# Patient Record
Sex: Male | Born: 1935
Health system: Southern US, Community
[De-identification: ages and names within clinical notes are randomized; demographics above are authoritative.]

## PROBLEM LIST (undated history)

## (undated) DIAGNOSIS — Z8719 Personal history of other diseases of the digestive system: Secondary | ICD-10-CM

## (undated) DIAGNOSIS — K224 Dyskinesia of esophagus: Secondary | ICD-10-CM

## (undated) DIAGNOSIS — I712 Thoracic aortic aneurysm, without rupture, unspecified: Secondary | ICD-10-CM

## (undated) DIAGNOSIS — M797 Fibromyalgia: Secondary | ICD-10-CM

## (undated) DIAGNOSIS — Z8774 Personal history of (corrected) congenital malformations of heart and circulatory system: Secondary | ICD-10-CM

## (undated) DIAGNOSIS — I442 Atrioventricular block, complete: Secondary | ICD-10-CM

## (undated) DIAGNOSIS — Z85828 Personal history of other malignant neoplasm of skin: Secondary | ICD-10-CM

## (undated) DIAGNOSIS — M1990 Unspecified osteoarthritis, unspecified site: Secondary | ICD-10-CM

## (undated) DIAGNOSIS — Z860101 Personal history of adenomatous and serrated colon polyps: Secondary | ICD-10-CM

## (undated) DIAGNOSIS — I10 Essential (primary) hypertension: Secondary | ICD-10-CM

## (undated) DIAGNOSIS — Z950 Presence of cardiac pacemaker: Secondary | ICD-10-CM

## (undated) DIAGNOSIS — K922 Gastrointestinal hemorrhage, unspecified: Secondary | ICD-10-CM

## (undated) DIAGNOSIS — M353 Polymyalgia rheumatica: Secondary | ICD-10-CM

## (undated) DIAGNOSIS — I2510 Atherosclerotic heart disease of native coronary artery without angina pectoris: Secondary | ICD-10-CM

## (undated) DIAGNOSIS — R6510 Systemic inflammatory response syndrome (SIRS) of non-infectious origin without acute organ dysfunction: Secondary | ICD-10-CM

## (undated) DIAGNOSIS — Z86010 Personal history of colonic polyps: Secondary | ICD-10-CM

## (undated) DIAGNOSIS — I4891 Unspecified atrial fibrillation: Secondary | ICD-10-CM

## (undated) DIAGNOSIS — R072 Precordial pain: Secondary | ICD-10-CM

## (undated) DIAGNOSIS — K921 Melena: Secondary | ICD-10-CM

## (undated) DIAGNOSIS — Z952 Presence of prosthetic heart valve: Secondary | ICD-10-CM

## (undated) DIAGNOSIS — K649 Unspecified hemorrhoids: Secondary | ICD-10-CM

## (undated) DIAGNOSIS — I350 Nonrheumatic aortic (valve) stenosis: Secondary | ICD-10-CM

## (undated) DIAGNOSIS — D500 Iron deficiency anemia secondary to blood loss (chronic): Secondary | ICD-10-CM

## (undated) HISTORY — DX: Personal history of adenomatous and serrated colon polyps: Z86.0101

## (undated) HISTORY — DX: Nonrheumatic aortic (valve) stenosis: I35.0

## (undated) HISTORY — DX: Thoracic aortic aneurysm, without rupture, unspecified: I71.20

## (undated) HISTORY — PX: HERNIA REPAIR: SHX51

## (undated) HISTORY — DX: Personal history of (corrected) congenital malformations of heart and circulatory system: Z87.74

## (undated) HISTORY — DX: Gastrointestinal hemorrhage, unspecified: K92.2

## (undated) HISTORY — DX: Melena: K92.1

## (undated) HISTORY — DX: Essential (primary) hypertension: I10

## (undated) HISTORY — DX: Dyskinesia of esophagus: K22.4

## (undated) HISTORY — DX: Unspecified atrial fibrillation: I48.91

## (undated) HISTORY — DX: Personal history of other malignant neoplasm of skin: Z85.828

## (undated) HISTORY — DX: Precordial pain: R07.2

## (undated) HISTORY — DX: Atherosclerotic heart disease of native coronary artery without angina pectoris: I25.10

## (undated) HISTORY — DX: Personal history of other diseases of the digestive system: Z87.19

## (undated) HISTORY — DX: Unspecified hemorrhoids: K64.9

## (undated) HISTORY — DX: Presence of prosthetic heart valve: Z95.2

## (undated) HISTORY — DX: Polymyalgia rheumatica: M35.3

## (undated) HISTORY — PX: HEMORROIDECTOMY: SUR656

## (undated) HISTORY — DX: Iron deficiency anemia secondary to blood loss (chronic): D50.0

## (undated) HISTORY — DX: Personal history of colonic polyps: Z86.010

## (undated) HISTORY — PX: BACK SURGERY: SHX140

## (undated) HISTORY — DX: Atrioventricular block, complete: I44.2

## (undated) HISTORY — DX: Systemic inflammatory response syndrome (sirs) of non-infectious origin without acute organ dysfunction: R65.10

## (undated) HISTORY — DX: Unspecified osteoarthritis, unspecified site: M19.90

## (undated) HISTORY — DX: Thoracic aortic aneurysm, without rupture: I71.2

## (undated) HISTORY — PX: OTHER SURGICAL HISTORY: SHX169

## (undated) HISTORY — PX: APPENDECTOMY: SHX54

## (undated) HISTORY — PX: PACEMAKER PLACEMENT: SHX43

## (undated) HISTORY — DX: Presence of cardiac pacemaker: Z95.0

## (undated) HISTORY — PX: BILATERAL KNEE ARTHROSCOPY: SUR91

---

## 1999-12-04 ENCOUNTER — Encounter: Payer: Self-pay | Admitting: Neurosurgery

## 1999-12-04 ENCOUNTER — Encounter: Admission: RE | Admit: 1999-12-04 | Discharge: 1999-12-04 | Payer: Self-pay | Admitting: Neurosurgery

## 1999-12-14 ENCOUNTER — Ambulatory Visit (HOSPITAL_COMMUNITY): Admission: RE | Admit: 1999-12-14 | Discharge: 1999-12-14 | Payer: Self-pay | Admitting: Neurosurgery

## 1999-12-14 ENCOUNTER — Encounter: Payer: Self-pay | Admitting: Neurosurgery

## 1999-12-28 ENCOUNTER — Ambulatory Visit (HOSPITAL_COMMUNITY): Admission: RE | Admit: 1999-12-28 | Discharge: 1999-12-28 | Payer: Self-pay | Admitting: Neurosurgery

## 1999-12-28 ENCOUNTER — Encounter: Payer: Self-pay | Admitting: Neurosurgery

## 2000-01-12 ENCOUNTER — Encounter: Payer: Self-pay | Admitting: Neurosurgery

## 2000-01-12 ENCOUNTER — Ambulatory Visit (HOSPITAL_COMMUNITY): Admission: RE | Admit: 2000-01-12 | Discharge: 2000-01-12 | Payer: Self-pay | Admitting: Neurosurgery

## 2001-05-14 ENCOUNTER — Ambulatory Visit (HOSPITAL_COMMUNITY): Admission: RE | Admit: 2001-05-14 | Discharge: 2001-05-14 | Payer: Self-pay | Admitting: Internal Medicine

## 2001-05-15 ENCOUNTER — Encounter: Payer: Self-pay | Admitting: Internal Medicine

## 2003-07-11 ENCOUNTER — Ambulatory Visit (HOSPITAL_COMMUNITY): Admission: RE | Admit: 2003-07-11 | Discharge: 2003-07-11 | Payer: Self-pay | Admitting: Neurosurgery

## 2003-07-11 ENCOUNTER — Encounter: Payer: Self-pay | Admitting: Neurosurgery

## 2003-07-21 ENCOUNTER — Encounter: Payer: Self-pay | Admitting: Internal Medicine

## 2003-07-21 ENCOUNTER — Ambulatory Visit (HOSPITAL_COMMUNITY): Admission: RE | Admit: 2003-07-21 | Discharge: 2003-07-21 | Payer: Self-pay | Admitting: Internal Medicine

## 2003-07-22 ENCOUNTER — Ambulatory Visit (HOSPITAL_COMMUNITY): Admission: RE | Admit: 2003-07-22 | Discharge: 2003-07-22 | Payer: Self-pay | Admitting: Cardiology

## 2004-04-06 ENCOUNTER — Ambulatory Visit (HOSPITAL_COMMUNITY): Admission: RE | Admit: 2004-04-06 | Discharge: 2004-04-06 | Payer: Self-pay | Admitting: Internal Medicine

## 2005-11-09 ENCOUNTER — Ambulatory Visit: Payer: Self-pay | Admitting: Internal Medicine

## 2005-11-28 ENCOUNTER — Ambulatory Visit (HOSPITAL_COMMUNITY): Admission: RE | Admit: 2005-11-28 | Discharge: 2005-11-28 | Payer: Self-pay | Admitting: Internal Medicine

## 2005-11-28 ENCOUNTER — Ambulatory Visit: Payer: Self-pay | Admitting: Internal Medicine

## 2005-12-05 ENCOUNTER — Ambulatory Visit (HOSPITAL_COMMUNITY): Admission: RE | Admit: 2005-12-05 | Discharge: 2005-12-05 | Payer: Self-pay | Admitting: Internal Medicine

## 2006-10-09 ENCOUNTER — Ambulatory Visit: Payer: Self-pay | Admitting: Internal Medicine

## 2006-10-23 ENCOUNTER — Ambulatory Visit: Payer: Self-pay | Admitting: Internal Medicine

## 2006-10-23 ENCOUNTER — Ambulatory Visit (HOSPITAL_COMMUNITY): Admission: RE | Admit: 2006-10-23 | Discharge: 2006-10-23 | Payer: Self-pay | Admitting: Internal Medicine

## 2007-01-21 ENCOUNTER — Ambulatory Visit: Payer: Self-pay | Admitting: Internal Medicine

## 2011-05-08 DIAGNOSIS — I10 Essential (primary) hypertension: Secondary | ICD-10-CM

## 2011-05-08 DIAGNOSIS — IMO0001 Reserved for inherently not codable concepts without codable children: Secondary | ICD-10-CM

## 2011-05-08 DIAGNOSIS — M1990 Unspecified osteoarthritis, unspecified site: Secondary | ICD-10-CM

## 2011-05-08 DIAGNOSIS — K6389 Other specified diseases of intestine: Secondary | ICD-10-CM

## 2011-05-08 DIAGNOSIS — Z9889 Other specified postprocedural states: Secondary | ICD-10-CM

## 2011-05-08 DIAGNOSIS — M353 Polymyalgia rheumatica: Secondary | ICD-10-CM

## 2011-05-08 DIAGNOSIS — E875 Hyperkalemia: Secondary | ICD-10-CM

## 2011-05-08 DIAGNOSIS — I350 Nonrheumatic aortic (valve) stenosis: Secondary | ICD-10-CM | POA: Insufficient documentation

## 2011-05-10 ENCOUNTER — Ambulatory Visit (INDEPENDENT_AMBULATORY_CARE_PROVIDER_SITE_OTHER): Payer: Medicare Other | Admitting: Cardiovascular Disease

## 2011-05-10 ENCOUNTER — Encounter: Payer: Self-pay | Admitting: Cardiovascular Disease

## 2011-05-10 DIAGNOSIS — R0602 Shortness of breath: Secondary | ICD-10-CM

## 2011-05-10 DIAGNOSIS — R011 Cardiac murmur, unspecified: Secondary | ICD-10-CM

## 2011-05-10 DIAGNOSIS — IMO0001 Reserved for inherently not codable concepts without codable children: Secondary | ICD-10-CM

## 2011-05-10 DIAGNOSIS — R0609 Other forms of dyspnea: Secondary | ICD-10-CM

## 2011-05-10 DIAGNOSIS — I700 Atherosclerosis of aorta: Secondary | ICD-10-CM

## 2011-05-10 DIAGNOSIS — I10 Essential (primary) hypertension: Secondary | ICD-10-CM

## 2011-05-10 DIAGNOSIS — R0600 Dyspnea, unspecified: Secondary | ICD-10-CM

## 2011-05-10 DIAGNOSIS — I2510 Atherosclerotic heart disease of native coronary artery without angina pectoris: Secondary | ICD-10-CM | POA: Insufficient documentation

## 2011-05-10 DIAGNOSIS — R079 Chest pain, unspecified: Secondary | ICD-10-CM

## 2011-05-10 DIAGNOSIS — R0989 Other specified symptoms and signs involving the circulatory and respiratory systems: Secondary | ICD-10-CM

## 2011-05-10 NOTE — Assessment & Plan Note (Signed)
Initial episode somewhat worrisome.  Octogenarian with abnormal ECG and HTN.  F/U lexiscan myovue

## 2011-05-10 NOTE — Assessment & Plan Note (Signed)
Clear lungs.  No history of CHF.  F/U echo

## 2011-05-10 NOTE — Patient Instructions (Addendum)
Your physician has requested that you have a lexiscan myoview. For further information please visit [email or web address]. Please follow instruction sheet, as given.  Your physician has requested that you have an echocardiogram. Echocardiography is a painless test that uses sound waves to create images of your heart. It provides your doctor with information about the size and shape of your heart and how well your heart's chambers and valves are working. This procedure takes approximately one hour. There are no restrictions for this procedure.  Your physician recommends that you schedule a follow-up appointment in 2 weeks. 

## 2011-05-10 NOTE — Assessment & Plan Note (Signed)
Murmur appears benign with preserved S2  F/U echo given weakness, and dyspnea

## 2011-05-10 NOTE — Progress Notes (Signed)
Pleasant 77 yo referred by Dr Ouida Sills for SSCP.  Had hours of pressure in chest last Saturday.  Felt weak.  More short of breath last few months.  No previous CAD.  Echo in 2006 with moderate aortic root dilatation with normal EF.  Felt better Sunday with no further pressure in chest.  Still feels weak and some dyspnea.  Pressure associated with palpitatoins or sensation of heart beating hard.  Long standing HTN Compliant with meds.    Reviewed labs from [facility] 11/06/10  LDL 102 LFT;s normal  ROS: Denies fever, malais, weight loss, blurry vision, decreased visual acuity, cough, sputum, SOB, hemoptysis, pleuritic pain, palpitaitons, heartburn, abdominal pain, melena, lower extremity edema, claudication, or rash.  All other systems reviewed and negative   General: Affect appropriate Healthy:  appears stated age HEENT: normal Neck supple with no adenopathy JVP normal no bruits no thyromegaly Lungs clear with no wheezing and good diaphragmatic motion Heart:  S1/S2 AV sclerosis  Murmur, no rub, gallop or click PMI normal Abdomen: benighn, BS positve, no tenderness, no AAA no bruit.  No HSM or HJR Distal pulses intact with no bruits No edema Neuro non-focal Skin warm and dry No muscular weakness  Medications Current Outpatient Prescriptions  Medication Sig Dispense Refill  . losartan (COZAAR) 50 MG tablet Take 50 mg by mouth daily.       Marland Kitchen NITROSTAT 0.4 MG SL tablet Place 0.4 mg under the tongue every 5 (five) minutes as needed.         Allergies Review of patient's allergies indicates not on file.  Family History: No family history on file.  Social History: History   Social History  . Marital Status: Single    Spouse Name: N/A    Number of Children: 4  . Years of Education: N/A   Occupational History  . retired     Engineer, maintenance   Social History Main Topics  . Smoking status: Never Smoker   . Smokeless tobacco: Never Used  . Alcohol Use: No  . Drug Use: No  . Sexually  Active: Not on file   Other Topics Concern  . Not on file   Social History Narrative  . No narrative on file    Electrocardiogram:  NSR 82 PAC LVH nonspecific St/T wave changes  Assessment and Plan

## 2011-05-10 NOTE — Assessment & Plan Note (Signed)
Well controlled.  Continue current medications and low sodium Dash type diet.    

## 2011-05-16 ENCOUNTER — Ambulatory Visit: Payer: Self-pay | Admitting: Cardiology

## 2011-05-18 ENCOUNTER — Other Ambulatory Visit: Payer: Self-pay | Admitting: Cardiovascular Disease

## 2011-05-18 DIAGNOSIS — R0602 Shortness of breath: Secondary | ICD-10-CM

## 2011-05-22 ENCOUNTER — Ambulatory Visit (INDEPENDENT_AMBULATORY_CARE_PROVIDER_SITE_OTHER): Payer: Medicare Other | Admitting: *Deleted

## 2011-05-22 ENCOUNTER — Encounter (HOSPITAL_COMMUNITY)
Admission: RE | Admit: 2011-05-22 | Discharge: 2011-05-22 | Disposition: A | Discharge status: Home / Self Care | Payer: Medicare Other | Source: Ambulatory Visit | Attending: Cardiovascular Disease | Admitting: Cardiovascular Disease

## 2011-05-22 ENCOUNTER — Encounter (HOSPITAL_COMMUNITY): Payer: Self-pay

## 2011-05-22 ENCOUNTER — Ambulatory Visit (HOSPITAL_COMMUNITY)
Admission: RE | Admit: 2011-05-22 | Discharge: 2011-05-22 | Disposition: A | Discharge status: Home / Self Care | Payer: Medicare Other | Source: Ambulatory Visit | Attending: Cardiovascular Disease | Admitting: Cardiovascular Disease

## 2011-05-22 DIAGNOSIS — R002 Palpitations: Secondary | ICD-10-CM | POA: Insufficient documentation

## 2011-05-22 DIAGNOSIS — R079 Chest pain, unspecified: Secondary | ICD-10-CM

## 2011-05-22 DIAGNOSIS — R0989 Other specified symptoms and signs involving the circulatory and respiratory systems: Secondary | ICD-10-CM | POA: Insufficient documentation

## 2011-05-22 DIAGNOSIS — R0602 Shortness of breath: Secondary | ICD-10-CM

## 2011-05-22 DIAGNOSIS — I10 Essential (primary) hypertension: Secondary | ICD-10-CM | POA: Insufficient documentation

## 2011-05-22 DIAGNOSIS — I359 Nonrheumatic aortic valve disorder, unspecified: Secondary | ICD-10-CM

## 2011-05-22 DIAGNOSIS — R011 Cardiac murmur, unspecified: Secondary | ICD-10-CM | POA: Insufficient documentation

## 2011-05-22 DIAGNOSIS — R0789 Other chest pain: Secondary | ICD-10-CM | POA: Insufficient documentation

## 2011-05-22 DIAGNOSIS — R0609 Other forms of dyspnea: Secondary | ICD-10-CM | POA: Insufficient documentation

## 2011-05-22 DIAGNOSIS — R0600 Dyspnea, unspecified: Secondary | ICD-10-CM

## 2011-05-22 MED ORDER — TECHNETIUM TC 99M TETROFOSMIN IV KIT
30.0000 | PACK | Freq: Once | INTRAVENOUS | Status: AC | PRN
  Administered 2011-05-22: 29 via INTRAVENOUS

## 2011-05-22 MED ORDER — TECHNETIUM TC 99M TETROFOSMIN IV KIT
10.0000 | PACK | Freq: Once | INTRAVENOUS | Status: AC | PRN
  Administered 2011-05-22: 10.54 via INTRAVENOUS

## 2011-05-22 NOTE — Progress Notes (Signed)
*  PRELIMINARY RESULTS* Echocardiogram 2D Echocardiogram has been performed.  Douglas [name] 05/22/2011, 3:09 PM

## 2011-05-22 NOTE — Progress Notes (Signed)
Stress Lab Nurses Notes - Douglas [name]  Douglas [name] 05/22/2011  Reason for doing test: Chest Pain and Dyspnea  Type of test: Steffanie Dunn  Nurse performing test: Marlena Clipper, RN  Nuclear Medicine Tech: Lyndel Pleasure  Echo Tech: Not Applicable  MD performing test: R. Rothbart  Family MD:   Test explained and consent signed: yes  IV started: 22g jelco, Saline lock flushed, No redness or edema and Saline lock started in radiology  Symptoms: funny feeling  Treatment/Intervention: None  Reason test stopped: protocol completed  After recovery IV was: Discontinued via X-ray tech and No redness or edema  Patient to return to Nuc. Med at :13:00  Patient discharged: Home  Patient's Condition upon discharge was: stable  Comments: Symptoms resolved in Recovery  [name]

## 2011-05-23 ENCOUNTER — Ambulatory Visit: Payer: Self-pay | Admitting: Cardiology

## 2011-05-25 ENCOUNTER — Other Ambulatory Visit: Payer: Self-pay | Admitting: Cardiology

## 2011-05-25 ENCOUNTER — Ambulatory Visit (INDEPENDENT_AMBULATORY_CARE_PROVIDER_SITE_OTHER): Payer: Medicare Other | Admitting: Cardiology

## 2011-05-25 ENCOUNTER — Encounter: Payer: Self-pay | Admitting: Cardiology

## 2011-05-25 DIAGNOSIS — R079 Chest pain, unspecified: Secondary | ICD-10-CM

## 2011-05-25 DIAGNOSIS — I10 Essential (primary) hypertension: Secondary | ICD-10-CM

## 2011-05-25 DIAGNOSIS — I77819 Aortic ectasia, unspecified site: Secondary | ICD-10-CM

## 2011-05-25 NOTE — Patient Instructions (Addendum)
Your physician has requested that you have cardiac CT. Cardiac computed tomography (CT) is a painless test that uses an x-ray machine to take clear, detailed pictures of your heart. For further information please visit [email or web address] Please follow instruction sheet as given.  Your physician recommends that you return for lab work in: today  Your physician recommends that you schedule a follow-up appointment in: 6 months

## 2011-05-25 NOTE — Progress Notes (Signed)
Clinical Summary Douglas [name] is a 77 y.o.male presenting for a followup visit. This is my first meeting with him. He was just recently seen by Dr. Eden [name] in mid July with symptoms of chest pain and shortness of breath. Followup cardiac testing is noted below.  Symptomatically, he reports feeling back to baseline, has had no chest pain or unusual shortness of breath within the last several weeks. His testing shows evidence of a bicuspid aortic valve with mild aortic stenosis, not likely symptom provoking however. LVEF is normal. He does have moderate dilatation of the thoracic aorta/arch. This has not otherwise been assessed in reviewing his records. Fortunately his ischemic workup is also reassuring. He reports compliance with his Cozaar.   No Known Allergies  Medication list reviewed.  Past Medical History  Diagnosis Date  . Dysphagia   . Essential hypertension, benign   . Hyperkalemia   . DJD (degenerative joint disease)   . PMR (polymyalgia rheumatica)   . Gout   . Arthritis   . Aortic sclerosis   . Hx of adenomatous colonic polyps   . Esophageal dysmotility     Past Surgical History  Procedure Date  . Bilateral knee arthroscopy   . Appendectomy   . Hemorroidectomy   . Left inguinal hernia repair     History reviewed. No pertinent family history.  Social History Douglas [name] reports that he has never smoked. He has never used smokeless tobacco. Douglas [name] reports that he does not drink alcohol.  Review of Systems Occasional brief palpitations, no dizziness or syncope. Otherwise negative.  Physical Examination Filed Vitals:   05/25/11 1524  BP: 140/75  Pulse: 80  Overweight elderly male in no acute distress. HEENT: Conjunctiva and lids normal, oropharynx with moist mucosa. Neck: Supple, no elevated JVP or carotid bruits. Lungs: Clear with diminished breath sounds, nonlabored. Cardiac: Regular rate and rhythm with 2/6 systolic murmur, preserved second heart sound, no  S3 gallop or rub. Abdomen: Soft, nontender, bowel sounds present. Skin: Warm and dry. Musculoskeletal: No kyphosis. Neuropsychiatric: Alert and oriented x3, affect appropriate.   Studies Echocardiogram 05/22/2011: - Left ventricle: The cavity size was normal. Wall thickness was     increased in a pattern of mild LVH. There was moderate focal basal     hypertrophy of the septum. Systolic function was normal. The     estimated ejection fraction was in the range of 55% to 60%. Wall     motion was normal; there were no regional wall motion     abnormalities.   - Aortic valve: Mildly calcified annulus. Bicuspid; moderately     thickened, moderately calcified leaflets. Valve mobility was     moderately restricted. There was mild stenosis. Valve area:     1.54cm^2(VTI). Valve area: 1.36cm^2 (Vmax).   - Ascending aorta: The ascending aorta was moderately dilated.   - Aortic arch: The aortic arch was moderately dilated.   - Mitral valve: Calcified annulus.   - Left atrium: The atrium was mildly dilated.   - Right ventricle: The cavity size was severely dilated. Wall     thickness was normal.   - Atrial septum: No defect or patent foramen ovale was identified.   Lexiscan Myoview 05/23/2011: IMPRESSION:   Essentially negative pharmacologic stress nuclear myocardial study   revealing no significant stress-induced EKG abnormalities, normal   left ventricular size and normal left ventricular systolic   function.  By scintigraphic imaging, there was either minimal   scarring at the base of the  inferior wall or diaphragmatic   attenuation.  No evidence for myocardial ischemia.  Other findings   as noted.   Problem List and Plan

## 2011-05-25 NOTE — Assessment & Plan Note (Signed)
Resolved. Recent Myoview was reassuring without active ischemia.

## 2011-05-25 NOTE — Assessment & Plan Note (Signed)
We discussed diet and weight loss, also sodium restriction. He reports compliance with Cozaar. This medicine could be further advanced, versus potentially adding a beta blocker. We will review the results of his chest CT first.

## 2011-05-25 NOTE — Assessment & Plan Note (Signed)
Moderate dilatation of the ascending aorta and arch noted on recent 2-D echocardiogram, also described in the past. This has not been otherwise assessed. Plan will be a chest CT with contrast to better define aortic size, and determine followup from there.

## 2011-05-26 LAB — BASIC METABOLIC PANEL
CO2: 28 mEq/L (ref 19–32)
Glucose, Bld: 112 mg/dL — ABNORMAL HIGH (ref 70–99)
Potassium: 4.6 mEq/L (ref 3.5–5.3)
Sodium: 142 mEq/L (ref 135–145)

## 2011-06-06 ENCOUNTER — Ambulatory Visit (HOSPITAL_COMMUNITY)
Admission: RE | Admit: 2011-06-06 | Discharge: 2011-06-06 | Disposition: A | Discharge status: Home / Self Care | Payer: Medicare Other | Source: Ambulatory Visit | Attending: Cardiology | Admitting: Cardiology

## 2011-06-06 DIAGNOSIS — I10 Essential (primary) hypertension: Secondary | ICD-10-CM | POA: Insufficient documentation

## 2011-06-06 DIAGNOSIS — J984 Other disorders of lung: Secondary | ICD-10-CM | POA: Insufficient documentation

## 2011-06-06 DIAGNOSIS — I77819 Aortic ectasia, unspecified site: Secondary | ICD-10-CM

## 2011-06-06 DIAGNOSIS — R079 Chest pain, unspecified: Secondary | ICD-10-CM | POA: Insufficient documentation

## 2011-06-06 MED ORDER — IOHEXOL 300 MG/ML  SOLN
80.0000 mL | Freq: Once | INTRAMUSCULAR | Status: AC | PRN
  Administered 2011-06-06: 80 mL via INTRAVENOUS

## 2011-06-11 ENCOUNTER — Telehealth: Payer: Self-pay

## 2011-06-11 NOTE — Telephone Encounter (Signed)
Message copied by Demetrios Loll on Mon Jun 11, 2011  2:14 PM ------      Message from: MCDOWELL, Illene Bolus      Created: Mon Jun 11, 2011  8:11 AM       Would have him see TCTS for opinion regarding thoracic aortic aneurysm. No Pulmonary consultation as yet.

## 2011-06-11 NOTE — Telephone Encounter (Signed)
Message copied by Demetrios Loll on Mon Jun 11, 2011  3:48 PM ------      Message from: MCDOWELL, Illene Bolus      Created: Mon Jun 11, 2011  8:11 AM       Would have him see TCTS for opinion regarding thoracic aortic aneurysm. No Pulmonary consultation as yet.

## 2011-06-13 DIAGNOSIS — I712 Thoracic aortic aneurysm, without rupture: Secondary | ICD-10-CM | POA: Insufficient documentation

## 2011-06-14 ENCOUNTER — Encounter: Payer: Self-pay | Admitting: Cardiothoracic Surgery

## 2011-06-14 ENCOUNTER — Institutional Professional Consult (permissible substitution) (INDEPENDENT_AMBULATORY_CARE_PROVIDER_SITE_OTHER): Payer: Medicare Other | Admitting: Cardiothoracic Surgery

## 2011-06-14 VITALS — BP 131/93 | HR 78 | Temp 97.2°F | Resp 16 | Ht 69.0 in | Wt 237.0 lb

## 2011-06-14 DIAGNOSIS — I350 Nonrheumatic aortic (valve) stenosis: Secondary | ICD-10-CM

## 2011-06-14 DIAGNOSIS — I359 Nonrheumatic aortic valve disorder, unspecified: Secondary | ICD-10-CM

## 2011-06-14 DIAGNOSIS — I712 Thoracic aortic aneurysm, without rupture: Secondary | ICD-10-CM

## 2011-06-14 DIAGNOSIS — Q231 Congenital insufficiency of aortic valve: Secondary | ICD-10-CM

## 2011-06-14 DIAGNOSIS — I10 Essential (primary) hypertension: Secondary | ICD-10-CM

## 2011-06-14 NOTE — Patient Instructions (Signed)
You have a small to medium sized aneurysm of the aorta  just above your heart. This is not large enough to need surgery but it will need to be followed with a CT scan in 6 months. High blood pressure can cause it to enlarge so take your meds and avoid lifting heavy weights above 25 pounds. I will see you back after the CT scan in 6 months.

## 2011-06-14 NOTE — Progress Notes (Signed)
Reason for consultation: A setting thoracic aortic aneurysm 5.5 cm diameter  History of present illness I was asked to evaluate this 77 year old obese hypertensive male nonsmoker for evaluation and potential surgical therapy for a recently diagnosed ascending thoracic aortic aneurysm. The patient initially presented to his cardiologist complaining of shortness of breath with exertion and some chest discomfort. A 2-D echo was performed which demonstrated normal LV systolic function, a bicuspid aortic valve with mild stenosis and calcification, and moderate left ventricular hypertrophy. A myocardial perfusion stress test was negative for ischemia. A CT scan of the chest with contrast was performed because dilatation of the aorta was noted on the 2-D echocardiogram. The CT scan of the chest confirmed a fusiform a setting aneurysm measuring 5.5 cm in diameter. The diameter of the aortic arch and descending thoracic aorta were normal. There is no evidence of a false lumen, intramural hematoma, or penetrating ulcer. Calcification of the thoracic aorta and the aortic valve was noted.  The patient denies any family history of abdominal or thoracic aortic aneurysm disease. The patient denies any history of aortic dissection in the family. The patient denies any interscapular or severe anterior chest pain. Risk factors for a thoracic aortic aneurysm include a bicuspid aortic valve, history of hypertension, and dyslipidemia. He denies history of smoking at any time.  Past medical history: hypertension, degenerative arthritis of both of both knees, polymyalgia rheumatica, gout, GERD with esophageal dysmotility problems, no known drug allergies.  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 He does not smoke or use alcohol. He stays fairly active during the day.  Family history negative for aortic aneurysm negative for heart valve surgery negative for coronary artery  disease.  Review of systems Constitutional review negative for fever or night sweats positive for intentional weight loss of 18 pounds over the past 2 months from 254 pounds to 237. ENT review is significant for some difficulty swallowing into his reflux and dysmotility problems. He denies any abdominal complaints. Breast review is negative her history of breast, negative for history of hemoptysis or recent symptoms of upper SPARC or infection. The CT scan of the chest did show some old granulomatous disease with small nodules less than 5 mm in both lung apices. He does have some shortness of breath with exertion. Cardiac review is negative for myocardial infarction, he has had a negative perfusion myocardial stress test recently and he has a soft systolic murmur of aortic sclerosis. He is in a sinus rhythm. He argues positive for his esophageal reflux disease negative for jaundice gallstones blood per rectum he does have history of colon polyps but not colon cancer. He has had an appendectomy in the past. Review is negative for diabetes or thyroid disease. vascular review is negative for DVT claudication or TIA. musculoskeletal is positive or arthritis in his knees with bi- lateral knee arthroscopic surgery. He denies any fractures of his lower extremities. Neurologic review is negative for stroke seizure or concussion.  Physical exam today his blood pressure is 131/93 pulse 70 and regular x-rays earlier 97% temperature 90.7 0.285 foot 9 inches weight 237 pounds. Gen. appearance is that of a elderly overweight Caucasian male in no acute distress accompanied by his wife in the office today. HEENT is normocephalic with equal pupils, good dentition. Neck is supple without JVD mass or carotid bruit. Lymphatics reveal no adenopathy palpable in the neck or supraclavicular fossa. Rex is not deformed in breath sounds  are clear and equal bilaterally. Cardiac exam is with a regular rhythm with a grade 2/6 systolic  murmur no gallop no rub. Abdominal exam is soft and nontender without pulsatile mass. Extremities no clubbing cyanosis or tenderness. He has 1-2+ ankle edema left greater than right. Peripheral pulses are 1+ in his feet are warm. Neurologic exam is alert and oriented without focal motor deficit.  Laboratory data. I reviewed the report of his echocardiogram and the images of his thoracic CT scan. He has a fusiform a descending thoracic aortic aneurysm associated with a bicuspid aortic valve with mild aortic stenosis. The maximal diameter of the descending aorta is 5.5 cm. His LV function is fairly well preserved and he has no evidence of coronary disease by a cardio perfusion screening exam.  Impression and plan. 77 year old underling fragile-appearing gentleman has a newly diagnosed fusiform a setting aneurysm which measures 5.5 cm in maximal diameter. We had no previous scans to compare so it is unknown whether the aorta is expanding recently or not. I believe that his operative risk would be significant and that the risk of aortic dissection would be equivalent to or less than his operative risk for this patient at his age and overall condition.   I would recommend following the thoracic aorta with a followup CT scan in 6 months. The patient understands to continue taking his blood pressure medicine and to avoid heavy lifting ( greater than 25 pounds). The patient will let his physicians know of any increase in his chest pain which at this point I believe is more related to his gastroesophageal reflux problem.

## 2011-06-15 ENCOUNTER — Encounter: Payer: Medicare Other | Admitting: Cardiothoracic Surgery

## 2011-06-28 ENCOUNTER — Encounter: Payer: Self-pay | Admitting: Cardiology

## 2011-10-30 ENCOUNTER — Other Ambulatory Visit: Payer: Self-pay | Admitting: Cardiothoracic Surgery

## 2011-10-30 DIAGNOSIS — I712 Thoracic aortic aneurysm, without rupture: Secondary | ICD-10-CM

## 2011-11-21 DIAGNOSIS — Z79899 Other long term (current) drug therapy: Secondary | ICD-10-CM | POA: Diagnosis not present

## 2011-11-21 DIAGNOSIS — M1990 Unspecified osteoarthritis, unspecified site: Secondary | ICD-10-CM | POA: Diagnosis not present

## 2011-11-21 DIAGNOSIS — I10 Essential (primary) hypertension: Secondary | ICD-10-CM | POA: Diagnosis not present

## 2011-11-21 DIAGNOSIS — E785 Hyperlipidemia, unspecified: Secondary | ICD-10-CM | POA: Diagnosis not present

## 2011-11-29 DIAGNOSIS — Z1212 Encounter for screening for malignant neoplasm of rectum: Secondary | ICD-10-CM | POA: Diagnosis not present

## 2011-11-29 DIAGNOSIS — M109 Gout, unspecified: Secondary | ICD-10-CM | POA: Diagnosis not present

## 2011-11-29 DIAGNOSIS — I4949 Other premature depolarization: Secondary | ICD-10-CM | POA: Diagnosis not present

## 2011-11-29 DIAGNOSIS — M1990 Unspecified osteoarthritis, unspecified site: Secondary | ICD-10-CM | POA: Diagnosis not present

## 2011-11-29 DIAGNOSIS — I10 Essential (primary) hypertension: Secondary | ICD-10-CM | POA: Diagnosis not present

## 2011-12-05 ENCOUNTER — Ambulatory Visit
Admission: RE | Admit: 2011-12-05 | Discharge: 2011-12-05 | Disposition: A | Discharge status: Home / Self Care | Payer: Medicare Other | Source: Ambulatory Visit | Attending: Cardiothoracic Surgery | Admitting: Cardiothoracic Surgery

## 2011-12-05 ENCOUNTER — Ambulatory Visit: Payer: Medicare Other | Admitting: Cardiothoracic Surgery

## 2011-12-05 ENCOUNTER — Other Ambulatory Visit: Payer: Self-pay | Admitting: Cardiothoracic Surgery

## 2011-12-05 DIAGNOSIS — I712 Thoracic aortic aneurysm, without rupture: Secondary | ICD-10-CM

## 2011-12-05 DIAGNOSIS — I700 Atherosclerosis of aorta: Secondary | ICD-10-CM | POA: Diagnosis not present

## 2011-12-05 DIAGNOSIS — J8410 Pulmonary fibrosis, unspecified: Secondary | ICD-10-CM | POA: Diagnosis not present

## 2011-12-05 MED ORDER — IOHEXOL 300 MG/ML  SOLN
75.0000 mL | Freq: Once | INTRAMUSCULAR | Status: AC | PRN
  Administered 2011-12-05: 75 mL via INTRAVENOUS

## 2011-12-19 ENCOUNTER — Ambulatory Visit: Payer: Medicare Other | Admitting: Cardiothoracic Surgery

## 2011-12-26 ENCOUNTER — Ambulatory Visit (INDEPENDENT_AMBULATORY_CARE_PROVIDER_SITE_OTHER): Payer: Medicare Other | Admitting: Cardiothoracic Surgery

## 2011-12-26 ENCOUNTER — Encounter: Payer: Self-pay | Admitting: Cardiothoracic Surgery

## 2011-12-26 VITALS — BP 155/88 | HR 77 | Resp 16 | Ht 65.0 in | Wt 237.0 lb

## 2011-12-26 DIAGNOSIS — I10 Essential (primary) hypertension: Secondary | ICD-10-CM | POA: Diagnosis not present

## 2011-12-26 DIAGNOSIS — I712 Thoracic aortic aneurysm, without rupture: Secondary | ICD-10-CM | POA: Diagnosis not present

## 2011-12-26 DIAGNOSIS — Z09 Encounter for follow-up examination after completed treatment for conditions other than malignant neoplasm: Secondary | ICD-10-CM

## 2011-12-30 NOTE — Progress Notes (Signed)
PCP is Carylon Perches, MD, MD Referring Provider is Jonelle Sidle, MD  Chief Complaint  Patient presents with  . 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 It is asymptomatic. He has some associated calcifications aortic valve and mild aortic valve disease. The peak gradient was 60 mm mercury. He is on antihypertensive medication and is followed by Dr. Ouida Sills. He is a nonsmoker.   Past Medical History  Diagnosis Date  . Dysphagia   . Essential hypertension, benign   . Hyperkalemia   . DJD (degenerative joint disease)   . PMR (polymyalgia rheumatica)   . Gout   . Arthritis   . Aortic sclerosis   . Hx of adenomatous colonic polyps   . Esophageal dysmotility   . Aneurysm of thoracic aorta     ascending. 5.7 x 5.5cm    Past Surgical History  Procedure Date  . Bilateral knee arthroscopy   . Appendectomy   . Hemorroidectomy   . Left inguinal hernia repair     Family History  Problem Relation Age of Onset  . Kidney disease Mother   . Heart disease Father     Social History History  Substance Use Topics  . Smoking status: Never Smoker   . Smokeless tobacco: Never Used  . Alcohol Use: No    Current Outpatient Prescriptions  Medication Sig Dispense Refill  . aspirin 81 MG tablet Take 81 mg by mouth daily.        Marland Kitchen losartan (COZAAR) 50 MG tablet Take 100 mg by mouth daily.       Marland Kitchen NITROSTAT 0.4 MG SL tablet Place 0.4 mg under the tongue every 5 (five) minutes as needed.         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 He denies any exertional chest pain. He has mild dyspnea on exertion. His spirometry today shows FVC of 3.5 FEV1 of 2.7 which is fairly good for a 78 year old male.  BP  155/88  Pulse 77  Resp 16  Ht 5\' 5"  (1.651 m)  Wt 237 lb (107.502 kg)  BMI 39.44 kg/m2  SpO2 99% Physical Exam General appearance-alert and comfortable Thorax-nontender clear breath sounds Cardiac-regular rhythm soft 2/6 systolic ejection murmur Vascular-palpable pulses in the extremities and neck Neurologic no focal motor deficit-  Diagnostic Tests: CTA of the thoracic aorta shows increase in size of the ascending aneurysm from 5.5-6.2 cm. There is no hematoma or false lumen. There is no pericardial effusion. There is calcification of the coronary arteries.  Impression: Documented increase in size of the ascending aneurysm. This would indicate a significant risk of spontaneous dissection of 8-10% per year. Surgery was recommended to the patient to repair the ascending aneurysm. He understands before surgery can be scheduled however a cardiac catheterization be needed to assess the coronary arteries which have calcification on the CTA. The patient is unsure of whether or not to go through the surgery at his age and we'll discuss situation family and then to notify our office of his decision. We discussed things that he could do short of surgery to reduce the risk of dissection including activity limitations, blood pressure control, and lifting limitations.  Plan:We will  await patient's decision.

## 2011-12-30 NOTE — Patient Instructions (Signed)
Your ascending thoracic aortic aneurysm has increased in size from the last scan performed August of last year or 6 months ago. This means that the descending aorta could tear and cause a condition called aortic dissection which is life-threatening and usually fatal. Surgery to correct this condition is major open-heart surgery taking 5 or 6 hours with a long recovery period and associated risks. The risks of the aorta tearing and causing dissection is higher than the risk of surgery and surgery is recommended. Prior to scheduling surgery he will need a cardiac catheterization. I understand that he wished to discuss the situation with your family decided he would like to proceed with surgery which would treat the aneurysm and reduce the risk of sudden death from dissection.

## 2012-01-01 ENCOUNTER — Ambulatory Visit (HOSPITAL_COMMUNITY)
Admission: RE | Admit: 2012-01-01 | Discharge: 2012-01-01 | Disposition: A | Discharge status: Home / Self Care | Payer: Medicare Other | Source: Ambulatory Visit | Attending: Internal Medicine | Admitting: Internal Medicine

## 2012-01-01 DIAGNOSIS — I359 Nonrheumatic aortic valve disorder, unspecified: Secondary | ICD-10-CM | POA: Insufficient documentation

## 2012-01-01 DIAGNOSIS — I10 Essential (primary) hypertension: Secondary | ICD-10-CM | POA: Insufficient documentation

## 2012-01-01 NOTE — Progress Notes (Signed)
*  PRELIMINARY RESULTS* Echocardiogram 2D Echocardiogram has been performed.  Douglas [name] 01/01/2012, 9:18 AM2

## 2012-01-03 ENCOUNTER — Ambulatory Visit (INDEPENDENT_AMBULATORY_CARE_PROVIDER_SITE_OTHER): Payer: Medicare Other | Admitting: Cardiology

## 2012-01-03 ENCOUNTER — Encounter: Payer: Self-pay | Admitting: Cardiology

## 2012-01-03 ENCOUNTER — Ambulatory Visit (HOSPITAL_COMMUNITY)
Admission: RE | Admit: 2012-01-03 | Discharge: 2012-01-03 | Disposition: A | Discharge status: Home / Self Care | Payer: Medicare Other | Source: Ambulatory Visit | Attending: Cardiology | Admitting: Cardiology

## 2012-01-03 VITALS — BP 156/98 | HR 90 | Resp 18 | Ht 69.0 in | Wt 237.0 lb

## 2012-01-03 DIAGNOSIS — I359 Nonrheumatic aortic valve disorder, unspecified: Secondary | ICD-10-CM

## 2012-01-03 DIAGNOSIS — Z7901 Long term (current) use of anticoagulants: Secondary | ICD-10-CM

## 2012-01-03 DIAGNOSIS — I517 Cardiomegaly: Secondary | ICD-10-CM | POA: Insufficient documentation

## 2012-01-03 DIAGNOSIS — J984 Other disorders of lung: Secondary | ICD-10-CM | POA: Diagnosis not present

## 2012-01-03 DIAGNOSIS — I77819 Aortic ectasia, unspecified site: Secondary | ICD-10-CM | POA: Diagnosis not present

## 2012-01-03 DIAGNOSIS — I350 Nonrheumatic aortic (valve) stenosis: Secondary | ICD-10-CM

## 2012-01-03 DIAGNOSIS — R079 Chest pain, unspecified: Secondary | ICD-10-CM

## 2012-01-03 DIAGNOSIS — M5380 Other specified dorsopathies, site unspecified: Secondary | ICD-10-CM | POA: Insufficient documentation

## 2012-01-03 DIAGNOSIS — I10 Essential (primary) hypertension: Secondary | ICD-10-CM

## 2012-01-03 DIAGNOSIS — I712 Thoracic aortic aneurysm, without rupture: Secondary | ICD-10-CM | POA: Diagnosis not present

## 2012-01-03 MED ORDER — METOPROLOL SUCCINATE ER 25 MG PO TB24: 25.0000 mg | ORAL_TABLET | Freq: Every day | ORAL | Status: DC

## 2012-01-03 NOTE — Assessment & Plan Note (Addendum)
Increased in size, now 6.2 cm by recent followup chest CT. He has been evaluated by Dr. Donata Clay and surgical repair was discussed. We will schedule a right and left heart catheterization to evaluate for obstructive CAD and also reassess aortic valve (bicuspid with mild to possibly moderate aortic stenosis). This is being scheduled for early next week. I am adding Toprol XL 25 mg daily to his present regimen. Labwork to be obtained.

## 2012-01-03 NOTE — Assessment & Plan Note (Signed)
Blood pressure is elevated today. He reports medication compliance. Adding Toprol XL 25 mg daily.

## 2012-01-03 NOTE — Patient Instructions (Addendum)
**Note De-Identified Roslyn Else Obfuscation** Your physician has requested that you have a cardiac catheterization. Cardiac catheterization is used to diagnose and/or treat various heart conditions. Doctors may recommend this procedure for a number of different reasons. The most common reason is to evaluate chest pain. Chest pain can be a symptom of coronary artery disease (CAD), and cardiac catheterization can show whether plaque is narrowing or blocking your heart's arteries. This procedure is also used to evaluate the valves, as well as measure the blood flow and oxygen levels in different parts of your heart. For further information please visit [email or web address] Please follow instruction sheet, as given.  Your physician has recommended you make the following change in your medication: Toprol XL 25 mg daily  Your physician recommends that you return for lab work in: Today  A chest x-ray takes a picture of the organs and structures inside the chest, including the heart, lungs, and blood vessels. This test can show several things, including, whether the heart is enlarges; whether fluid is building up in the lungs; and whether pacemaker / defibrillator leads are still in place.  Your physician recommends that you schedule a follow-up appointment in: After Cath

## 2012-01-03 NOTE — Assessment & Plan Note (Signed)
Bicuspid aortic valve with evidence of mild to possibly moderate stenosis. Should be further assessed at catheterization (left and right heart, cross valve). If this does not further clarify, may need a TEE just prior to surgery.

## 2012-01-03 NOTE — Progress Notes (Signed)
   Clinical Summary Douglas [name] is a 78 y.o.male presenting to the office for evaluation following recent visit with Dr. Van Trigt. Recent chest CT angiogram shows progression in size of his thoracic aortic aneurysm, now measuring 6.2 cm. Surgical repair is recommended, and patient is referred to arrange a cardiac catheterization.  He also had a recent followup echocardiogram showing LVEF of 60-65% with grade 1 diastolic dysfunction. Aortic valve appeared bicuspid with moderately thickened leaflets, and evidence of mild to possibly moderate stenosis. We discussed this today.  He is here with his wife and daughter. We reviewed the situation. He reports no chest pain or progressive breathlessness. Medications also reviewed. They had several questions which were addressed.  We discussed the risk/benefit profile of diagnostic cardiac catheterization, rationale for proceeding. He is in agreement.   No Known Allergies  Current Outpatient Prescriptions  Medication Sig Dispense Refill  . aspirin 81 MG tablet Take 81 mg by mouth daily.        . losartan (COZAAR) 50 MG tablet Take 100 mg by mouth daily.       . NITROSTAT 0.4 MG SL tablet Place 0.4 mg under the tongue every 5 (five) minutes as needed.       . metoprolol succinate (TOPROL XL) 25 MG 24 hr tablet Take 1 tablet (25 mg total) by mouth daily.  30 tablet  3    Past Medical History  Diagnosis Date  . Dysphagia   . Essential hypertension, benign   . Hyperkalemia   . DJD (degenerative joint disease)   . PMR (polymyalgia rheumatica)   . Gout   . Arthritis   . Aortic sclerosis   . Hx of adenomatous colonic polyps   . Esophageal dysmotility   . Aneurysm of thoracic aorta     ascending. 5.7 x 5.5cm    Past Surgical History  Procedure Date  . Bilateral knee arthroscopy   . Appendectomy   . Hemorroidectomy   . Left inguinal hernia repair     Family History  Problem Relation Age of Onset  . Kidney disease Mother   . Heart disease  Father     Social History Douglas [name] reports that he has never smoked. He has never used smokeless tobacco. Douglas [name] reports that he does not drink alcohol.  Review of Systems No palpitations or syncope. No bleeding problems. Otherwise negative.  Physical Examination Filed Vitals:   01/03/12 1503  BP: 156/98  Pulse: 90  Resp: 18   Overweight elderly male in no acute distress.  HEENT: Conjunctiva and lids normal, oropharynx with moist mucosa.  Neck: Supple, no elevated JVP or carotid bruits.  Lungs: Clear with diminished breath sounds, nonlabored.  Cardiac: Regular rate and rhythm with 2/6 systolic murmur, preserved second heart sound, no S3 gallop or rub.  Abdomen: Soft, nontender, bowel sounds present.  Skin: Warm and dry.  Musculoskeletal: No kyphosis.  Neuropsychiatric: Alert and oriented x3, affect appropriate.   ECG Sinus rhythm with PACs, LVH.    Problem List and Plan   

## 2012-01-04 ENCOUNTER — Other Ambulatory Visit: Payer: Self-pay | Admitting: Cardiology

## 2012-01-04 DIAGNOSIS — I350 Nonrheumatic aortic (valve) stenosis: Secondary | ICD-10-CM

## 2012-01-04 LAB — PROTIME-INR: Prothrombin Time: 13.8 seconds (ref 11.6–15.2)

## 2012-01-04 LAB — BASIC METABOLIC PANEL
CO2: 26 mEq/L (ref 19–32)
Calcium: 9.3 mg/dL (ref 8.4–10.5)
Creat: 0.99 mg/dL (ref 0.50–1.35)
Glucose, Bld: 86 mg/dL (ref 70–99)

## 2012-01-04 LAB — CBC WITH DIFFERENTIAL/PLATELET
Basophils Absolute: 0.1 10*3/uL (ref 0.0–0.1)
Basophils Relative: 1 % (ref 0–1)
MCHC: 33.1 g/dL (ref 30.0–36.0)
Monocytes Absolute: 0.7 10*3/uL (ref 0.1–1.0)
Neutro Abs: 5.2 10*3/uL (ref 1.7–7.7)
Neutrophils Relative %: 66 % (ref 43–77)
RDW: 12.8 % (ref 11.5–15.5)

## 2012-01-04 LAB — APTT: aPTT: 35 seconds (ref 24–37)

## 2012-01-08 ENCOUNTER — Inpatient Hospital Stay (HOSPITAL_BASED_OUTPATIENT_CLINIC_OR_DEPARTMENT_OTHER)
Admission: RE | Admit: 2012-01-08 | Discharge: 2012-01-08 | Disposition: A | Discharge status: Home / Self Care | Payer: Medicare Other | Source: Ambulatory Visit | Attending: Cardiology | Admitting: Cardiology

## 2012-01-08 ENCOUNTER — Encounter (HOSPITAL_BASED_OUTPATIENT_CLINIC_OR_DEPARTMENT_OTHER): Payer: Self-pay | Admitting: Cardiology

## 2012-01-08 ENCOUNTER — Encounter (HOSPITAL_BASED_OUTPATIENT_CLINIC_OR_DEPARTMENT_OTHER)
Admission: RE | Disposition: A | Discharge status: Home / Self Care | Payer: Self-pay | Source: Ambulatory Visit | Attending: Cardiology

## 2012-01-08 DIAGNOSIS — M1990 Unspecified osteoarthritis, unspecified site: Secondary | ICD-10-CM | POA: Insufficient documentation

## 2012-01-08 DIAGNOSIS — M353 Polymyalgia rheumatica: Secondary | ICD-10-CM | POA: Insufficient documentation

## 2012-01-08 DIAGNOSIS — I359 Nonrheumatic aortic valve disorder, unspecified: Secondary | ICD-10-CM | POA: Insufficient documentation

## 2012-01-08 DIAGNOSIS — M129 Arthropathy, unspecified: Secondary | ICD-10-CM | POA: Insufficient documentation

## 2012-01-08 DIAGNOSIS — I712 Thoracic aortic aneurysm, without rupture, unspecified: Secondary | ICD-10-CM | POA: Insufficient documentation

## 2012-01-08 DIAGNOSIS — I2510 Atherosclerotic heart disease of native coronary artery without angina pectoris: Secondary | ICD-10-CM

## 2012-01-08 DIAGNOSIS — I350 Nonrheumatic aortic (valve) stenosis: Secondary | ICD-10-CM

## 2012-01-08 DIAGNOSIS — I10 Essential (primary) hypertension: Secondary | ICD-10-CM | POA: Insufficient documentation

## 2012-01-08 DIAGNOSIS — M109 Gout, unspecified: Secondary | ICD-10-CM | POA: Insufficient documentation

## 2012-01-08 LAB — POCT I-STAT 3, VENOUS BLOOD GAS (G3P V)
Acid-base deficit: 2 mmol/L (ref 0.0–2.0)
Acid-base deficit: 5 mmol/L — ABNORMAL HIGH (ref 0.0–2.0)
Bicarbonate: 22.3 mEq/L (ref 20.0–24.0)
O2 Saturation: 60 %
O2 Saturation: 63 %
TCO2: 24 mmol/L (ref 0–100)
TCO2: 26 mmol/L (ref 0–100)
pCO2, Ven: 48.6 mmHg (ref 45.0–50.0)
pO2, Ven: 35 mmHg (ref 30.0–45.0)

## 2012-01-08 LAB — POCT I-STAT 3, ART BLOOD GAS (G3+)
Acid-base deficit: 2 mmol/L (ref 0.0–2.0)
Bicarbonate: 24.1 mEq/L — ABNORMAL HIGH (ref 20.0–24.0)

## 2012-01-08 SURGERY — JV LEFT AND RIGHT HEART CATHETERIZATION WITH CORONARY ANGIOGRAM
Anesthesia: Moderate Sedation

## 2012-01-08 MED ORDER — SODIUM CHLORIDE 0.9 % IV SOLN: INTRAVENOUS | Status: DC

## 2012-01-08 MED ORDER — ACETAMINOPHEN 325 MG PO TABS: 650.0000 mg | ORAL_TABLET | ORAL | Status: DC | PRN

## 2012-01-08 MED ORDER — DIAZEPAM 5 MG PO TABS
5.0000 mg | ORAL_TABLET | ORAL | Status: AC
  Administered 2012-01-08: 5 mg via ORAL

## 2012-01-08 MED ORDER — ONDANSETRON HCL 4 MG/2ML IJ SOLN: 4.0000 mg | Freq: Four times a day (QID) | INTRAMUSCULAR | Status: DC | PRN

## 2012-01-08 MED ORDER — ASPIRIN 81 MG PO CHEW
324.0000 mg | CHEWABLE_TABLET | ORAL | Status: AC
  Administered 2012-01-08: 243 mg via ORAL

## 2012-01-08 NOTE — OR Nursing (Signed)
Dr Riley Kill and Dr Darrell Jewel at bedside to discuss results and treatment plan with pt and family

## 2012-01-08 NOTE — H&P (View-Only) (Signed)
   Clinical Summary Mr. Buskey is a 78 y.o.male presenting to the office for evaluation following recent visit with Dr. Donata Clay. Recent chest CT angiogram shows progression in size of his thoracic aortic aneurysm, now measuring 6.2 cm. Surgical repair is recommended, and patient is referred to arrange a cardiac catheterization.  He also had a recent followup echocardiogram showing LVEF of 60-65% with grade 1 diastolic dysfunction. Aortic valve appeared bicuspid with moderately thickened leaflets, and evidence of mild to possibly moderate stenosis. We discussed this today.  He is here with his wife and daughter. We reviewed the situation. He reports no chest pain or progressive breathlessness. Medications also reviewed. They had several questions which were addressed.  We discussed the risk/benefit profile of diagnostic cardiac catheterization, rationale for proceeding. He is in agreement.   No Known Allergies  Current Outpatient Prescriptions  Medication Sig Dispense Refill  . aspirin 81 MG tablet Take 81 mg by mouth daily.        Marland Kitchen losartan (COZAAR) 50 MG tablet Take 100 mg by mouth daily.       Marland Kitchen NITROSTAT 0.4 MG SL tablet Place 0.4 mg under the tongue every 5 (five) minutes as needed.       . metoprolol succinate (TOPROL XL) 25 MG 24 hr tablet Take 1 tablet (25 mg total) by mouth daily.  30 tablet  3    Past Medical History  Diagnosis Date  . Dysphagia   . Essential hypertension, benign   . Hyperkalemia   . DJD (degenerative joint disease)   . PMR (polymyalgia rheumatica)   . Gout   . Arthritis   . Aortic sclerosis   . Hx of adenomatous colonic polyps   . Esophageal dysmotility   . Aneurysm of thoracic aorta     ascending. 5.7 x 5.5cm    Past Surgical History  Procedure Date  . Bilateral knee arthroscopy   . Appendectomy   . Hemorroidectomy   . Left inguinal hernia repair     Family History  Problem Relation Age of Onset  . Kidney disease Mother   . Heart disease  Father     Social History Mr. Violett reports that he has never smoked. He has never used smokeless tobacco. Mr. Primo reports that he does not drink alcohol.  Review of Systems No palpitations or syncope. No bleeding problems. Otherwise negative.  Physical Examination Filed Vitals:   01/03/12 1503  BP: 156/98  Pulse: 90  Resp: 18   Overweight elderly male in no acute distress.  HEENT: Conjunctiva and lids normal, oropharynx with moist mucosa.  Neck: Supple, no elevated JVP or carotid bruits.  Lungs: Clear with diminished breath sounds, nonlabored.  Cardiac: Regular rate and rhythm with 2/6 systolic murmur, preserved second heart sound, no S3 gallop or rub.  Abdomen: Soft, nontender, bowel sounds present.  Skin: Warm and dry.  Musculoskeletal: No kyphosis.  Neuropsychiatric: Alert and oriented x3, affect appropriate.   ECG Sinus rhythm with PACs, LVH.    Problem List and Plan

## 2012-01-08 NOTE — OR Nursing (Signed)
Meal served 

## 2012-01-08 NOTE — OR Nursing (Signed)
Tegaderm dressing applied, site level 0, bedrest began at 1020 

## 2012-01-08 NOTE — CV Procedure (Signed)
   Cardiac Catheterization Procedure Note  Name: Douglas [name]. MRN: [id number] DOB: 1928-10-20  Procedure: Right Heart Cath, Left Heart Cath, Selective Coronary Angiography, LV angiography  Indication: aortic root enlargment, pre-op   Procedural Details: The right groin was prepped, draped, and anesthetized with 1% lidocaine. Using the Smart needle a 5 French sheath was placed in the right femoral artery and a 7 French sheath was placed in the right femoral vein. A Swan-Ganz catheter was used for the right heart catheterization. Standard protocol was followed for recording of right heart pressures and sampling of oxygen saturations. Fick cardiac output was calculated. Standard Judkins catheters (13F 6 curve for LCA, JR4 for RCA) were used for selective coronary angiography and pig tail for aortic root angio.   The valve was crossed with an RCA catheter and straight wire. There were no immediate procedural complications.  Dr. Donata Clay and I reviewed the findings, and I spoke with the family.   The patient was transferred to the post catheterization recovery area for further monitoring.  Procedural Findings: Hemodynamics RA 5 RV 26/6 PA 28/8 (16) PCWP 9 LV 167/13 AO 159/87 (116) AV gradient 6-8 mmHg   Oxygen saturations: SVC 60 PA 63 AO 91  Cardiac Output/Index (Fick) 4.7/2.2  Cardiac Output/Index (Thermo) 4.4/2.1   Coronary angiography: Coronary dominance: right  Left mainstem: Calcified without significant restriction  Left anterior descending (LAD): Heavily calcified and courses to the apex.  30% proximal.  Heavily calcified mid vessel with up to 80% calcified plaque just beyond diagonal takeoff.  Distal LAD suitable for grafting.  The apical vessel tapers.  The diagonal is calcified with 70% ostial, 80% mid, and large distal vessel that is graftable.  Left circumflex (LCx): Provides one major marginal that is calcified with 80-90% focal calcified stenosis that is  graftable  Right coronary artery (RCA): Heavily calcified vessel. 40% restriction near prox mid junction, 60% at acute margin, and 60-70% segmental calcified plaque in distal vessel.  The PDA has a mid 70% stenosis.   The large PLA has 50% segmental plaque in the continuation portion.  The PDA and PLA are both graftable.   Left ventriculography: Not performed due to echo data Aortography: Heavily calcified aortic valve with reduced motion.  Diffusely enlarge aortic root consistent with echo and CT findings.  No significant aortic regurgitation.  Final Conclusions:   1.  Proximal thoracic aortic aneurysm 2.  Mild gradient across heavily calcified aortic valve as noted---consistent with mild aortic stenosis 3.  Three vessel CAD with heavily calcified coronary arteries.  Recommendations:  1.  Dr. Donata Clay was here in the lab and reviewed the films and spoke with the patient and family.  He is making arrangements for office follow up and planned surgery.      [name] MD, [facility], FSCAI 01/08/2012, 10:19 AM

## 2012-01-08 NOTE — OR Nursing (Signed)
Discharge instructions reviewed and signed, pt stated understanding, ambulated in hall without difficulty, site level 0, transported to son's car via wheelchair 

## 2012-01-08 NOTE — Interval H&P Note (Signed)
History and Physical Interval Note:  01/08/2012 8:44 AM  Douglas [name].  has presented today for surgery, with the diagnosis of AS  The various methods of treatment have been discussed with the patient and family. After consideration of risks, benefits and other options for treatment, the patient has consented to  Procedure(s) (LRB): JV LEFT AND RIGHT HEART CATHETERIZATION WITH CORONARY ANGIOGRAM (N/A) as a surgical intervention .  The patients' history has been reviewed, patient examined, no change in status, stable for surgery.  I have reviewed the patients' chart and labs.  Questions were answered to the patient's satisfaction.   As noted, I have reviewed all of the relevant documents and talked with the patient.  We will attempt to cross the valve, and perform aortic root angiography.  R heart will also be performed.  The main indication is significant increase in root size by imaging.    Douglas [name] 8:46 AM 01/08/2012     Douglas [name]

## 2012-01-09 ENCOUNTER — Telehealth: Payer: Self-pay | Admitting: Cardiology

## 2012-01-09 NOTE — Telephone Encounter (Addendum)
Pt is bruising at cath site he doesn't feel a knot and it is not swollen or painful nor is it warm to the touch but he wants to talk to someone about  I put Dr. Riley Kill by mistake because pt told me he was a TS pt but he only did cath sorry

## 2012-01-09 NOTE — Telephone Encounter (Signed)
**Note De-Identified Douglas [name] Obfuscation** Pt. had heart cath yesterday and c/o bruising at cath site. Pt. is advised that bruising is normal after cath and that if he has redness, pain, swelling or heat around site to call office. He verbalized understanding./LV

## 2012-01-16 ENCOUNTER — Encounter: Payer: Self-pay | Admitting: Cardiothoracic Surgery

## 2012-01-16 ENCOUNTER — Other Ambulatory Visit: Payer: Self-pay

## 2012-01-16 ENCOUNTER — Institutional Professional Consult (permissible substitution) (INDEPENDENT_AMBULATORY_CARE_PROVIDER_SITE_OTHER): Payer: Medicare Other | Admitting: Cardiothoracic Surgery

## 2012-01-16 ENCOUNTER — Other Ambulatory Visit: Payer: Self-pay | Admitting: Cardiothoracic Surgery

## 2012-01-16 VITALS — BP 135/80 | HR 96 | Resp 20 | Ht 69.0 in | Wt 234.0 lb

## 2012-01-16 DIAGNOSIS — I712 Thoracic aortic aneurysm, without rupture, unspecified: Secondary | ICD-10-CM

## 2012-01-16 DIAGNOSIS — I2510 Atherosclerotic heart disease of native coronary artery without angina pectoris: Secondary | ICD-10-CM | POA: Diagnosis not present

## 2012-01-16 DIAGNOSIS — I10 Essential (primary) hypertension: Secondary | ICD-10-CM | POA: Diagnosis not present

## 2012-01-16 DIAGNOSIS — I359 Nonrheumatic aortic valve disorder, unspecified: Secondary | ICD-10-CM

## 2012-01-16 DIAGNOSIS — I350 Nonrheumatic aortic (valve) stenosis: Secondary | ICD-10-CM

## 2012-01-16 NOTE — Progress Notes (Signed)
PCP is Carylon Perches, MD, MD Referring Provider is Herby Abraham, MD                      [address] [place]            [place] [address]          [phone]      Chief Complaint  Patient presents with  . Aortic Stenosis    Referral from Dr Riley Kill for eval on As, CAD, cardiac cath on 01/08/12   . Coronary Artery Disease    HPI: The patient is a 78 year old Caucasian male nonsmoker still working as a Visual merchandiser who returns for further discussion of a recently diagnosed enlarging ascending thoracic aneurysm measuring over 6 cm associated with moderate aortic stenosis with valve area 0.95. He recently underwent a left and right heart cath. This demonstrated PA pressures of 28/16 cardiac output of 4.4 LVEDP 14 and coronary artery disease involving the LAD, 80%, and the circumflex, 90% stenosis. The right coronary was dominant and had moderate disease but not significant. The patient returns today discuss a Bentall procedure with combined surgical coronary revascularization.  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 He is on minimal medication including aspirin Cozaar Toprol-XL and when necessary nitroglycerin. His a sending thoracic aneurysm showed a documented increase in size over 6 months from 5.5 up to 6 cm. He denies active chest pain.   Past Medical History  Diagnosis Date  . Dysphagia   . Essential hypertension, benign   . Hyperkalemia   . DJD (degenerative joint disease)   . PMR (polymyalgia rheumatica)   . Gout   . Arthritis   . Aortic sclerosis   . Hx of adenomatous colonic polyps   . Esophageal dysmotility   . Aneurysm of thoracic aorta     ascending. 5.7 x 5.5cm    Past Surgical History  Procedure Date  . Bilateral knee arthroscopy   . Appendectomy   . Hemorroidectomy   . Left inguinal hernia repair     Family History  Problem Relation Age of Onset  . Kidney disease Mother   . Heart disease Father      Social History History  Substance Use Topics  . Smoking status: Never Smoker   . Smokeless tobacco: Never Used  . Alcohol Use: No    Current Outpatient Prescriptions  Medication Sig Dispense Refill  . aspirin 81 MG tablet Take 81 mg by mouth daily.        Marland Kitchen losartan (COZAAR) 50 MG tablet Take 100 mg by mouth daily.       . metoprolol succinate (TOPROL XL) 25 MG 24 hr tablet Take 1 tablet (25 mg total) by mouth daily.  30 tablet  3  . NITROSTAT 0.4 MG SL tablet Place 0.4 mg under the tongue every 5 (five) minutes as needed.         No Known Allergies  Review of Systems no fever no weight loss good appetite no ankle edema no abdominal pain BP 135/80  Pulse 96  Resp 20  Ht 5\' 9"  (1.753 m)  Wt 234 lb (106.142 kg)  BMI 34.56 kg/m2  SpO2 99% Physical Exam General appearance alert comfortable appropriate HEENT pupils equal dentition good Neck no bruit or JVD Lungs clear Cardiac regular rhythm 2/6 systolic murmur Abdomen soft obese nontender Extremities no clubbing or tenderness mild ankle edema Vascular 1+ pedal pulses Diagnostic  Tests: Cardiac catheterization results reviewed the patient and family. 2-D echo as well as CTA of the thoracic aorta results reviewed. The problems with his heart were individually discussed including the ascending aneurysm, the aortic stenosis, and the multivessel coronary artery disease.  Impression: The patient has an enlarging ascending aneurysm associated with significant 2 vessel CAD and moderate aortic stenosis. His risk for dissection is significant due to the 6 month increase in diameter of 5 mm. Best long-term therapy for his heart disease is a combined Bentall procedure with CABG. This was discussed in detail the patient, wife, and sewn occluding the indications benefits alternatives and risks. He agrees to proceed with surgery understanding there is a 8% risk of death major stroke or morbidity. He understands the risk of dissection is  approximately 8-10% per year without surgery.  Plan: Combined Bentall and CABG scheduled for April for at [facility] hospital. We used a bioprosthetic aortic valve.

## 2012-01-17 ENCOUNTER — Encounter (HOSPITAL_COMMUNITY): Payer: Self-pay | Admitting: Pharmacy Technician

## 2012-01-21 NOTE — Pre-Procedure Instructions (Addendum)
[address] Lagrand.  01/21/2012   Your procedure is scheduled on:  Thursday January 24, 2012 at 0730 am.  Report to [facility] at 0530 AM.  Call this number if you have problems the morning of surgery: [phone]   Remember:   Do not eat food:After Midnight.  May have clear liquids: up to 4 Hours before arrival until 0130 am.  Clear liquids include soda, tea, black coffee, apple or grape juice, broth.  Take these medicines the morning of surgery with A SIP OF WATER: Metoprolol (Toprol XL)   Do not wear jewelry, make-up or nail polish.  Do not wear lotions, powders, or perfumes. You may wear deodorant.  Do not shave 48 hours prior to surgery.  Do not bring valuables to the hospital.  Contacts, dentures or bridgework may not be worn into surgery.  Leave suitcase in the car. After surgery it may be brought to your room.  For patients admitted to the hospital, checkout time is 11:00 AM the day of discharge.   Patients discharged the day of surgery will not be allowed to drive home.  Name and phone number of your driver:   Special Instructions: CHG Shower Use Special Wash: 1/2 bottle night before surgery and 1/2 bottle morning of surgery.   Please read over the following fact sheets that you were given: Pain Booklet, Coughing and Deep Breathing, Blood Transfusion Information and Surgical Site Infection Prevention

## 2012-01-22 ENCOUNTER — Encounter (HOSPITAL_COMMUNITY): Payer: Self-pay

## 2012-01-22 ENCOUNTER — Inpatient Hospital Stay (HOSPITAL_COMMUNITY)
Admission: RE | Admit: 2012-01-22 | Discharge: 2012-01-22 | Disposition: A | Discharge status: Home / Self Care | Payer: Medicare Other | Source: Ambulatory Visit | Attending: Cardiothoracic Surgery | Admitting: Cardiothoracic Surgery

## 2012-01-22 ENCOUNTER — Encounter (HOSPITAL_COMMUNITY)
Admission: RE | Admit: 2012-01-22 | Discharge: 2012-01-22 | Disposition: A | Discharge status: Home / Self Care | Payer: Medicare Other | Source: Ambulatory Visit | Attending: Cardiothoracic Surgery | Admitting: Cardiothoracic Surgery

## 2012-01-22 ENCOUNTER — Ambulatory Visit (HOSPITAL_COMMUNITY)
Admission: RE | Admit: 2012-01-22 | Discharge: 2012-01-22 | Disposition: A | Discharge status: Home / Self Care | Payer: Medicare Other | Source: Ambulatory Visit | Attending: Cardiothoracic Surgery | Admitting: Cardiothoracic Surgery

## 2012-01-22 VITALS — BP 144/81 | HR 78 | Temp 97.0°F | Resp 18 | Ht 69.0 in | Wt 237.4 lb

## 2012-01-22 DIAGNOSIS — Z01812 Encounter for preprocedural laboratory examination: Secondary | ICD-10-CM | POA: Insufficient documentation

## 2012-01-22 DIAGNOSIS — I712 Thoracic aortic aneurysm, without rupture, unspecified: Secondary | ICD-10-CM | POA: Insufficient documentation

## 2012-01-22 DIAGNOSIS — K449 Diaphragmatic hernia without obstruction or gangrene: Secondary | ICD-10-CM | POA: Insufficient documentation

## 2012-01-22 DIAGNOSIS — I359 Nonrheumatic aortic valve disorder, unspecified: Secondary | ICD-10-CM

## 2012-01-22 DIAGNOSIS — Z79899 Other long term (current) drug therapy: Secondary | ICD-10-CM | POA: Insufficient documentation

## 2012-01-22 DIAGNOSIS — Z01811 Encounter for preprocedural respiratory examination: Secondary | ICD-10-CM | POA: Diagnosis not present

## 2012-01-22 DIAGNOSIS — I2510 Atherosclerotic heart disease of native coronary artery without angina pectoris: Secondary | ICD-10-CM

## 2012-01-22 DIAGNOSIS — I10 Essential (primary) hypertension: Secondary | ICD-10-CM | POA: Insufficient documentation

## 2012-01-22 DIAGNOSIS — I517 Cardiomegaly: Secondary | ICD-10-CM | POA: Diagnosis not present

## 2012-01-22 DIAGNOSIS — Z01810 Encounter for preprocedural cardiovascular examination: Secondary | ICD-10-CM | POA: Diagnosis not present

## 2012-01-22 HISTORY — DX: Fibromyalgia: M79.7

## 2012-01-22 HISTORY — DX: Personal history of other diseases of the digestive system: Z87.19

## 2012-01-22 LAB — URINALYSIS, ROUTINE W REFLEX MICROSCOPIC
Bilirubin Urine: NEGATIVE
Glucose, UA: NEGATIVE mg/dL
Hgb urine dipstick: NEGATIVE
Ketones, ur: NEGATIVE mg/dL
Leukocytes, UA: NEGATIVE
Nitrite: NEGATIVE
Protein, ur: NEGATIVE mg/dL
Specific Gravity, Urine: 1.011 (ref 1.005–1.030)
Urobilinogen, UA: 0.2 mg/dL (ref 0.0–1.0)
pH: 5.5 (ref 5.0–8.0)

## 2012-01-22 LAB — PULMONARY FUNCTION TEST

## 2012-01-22 LAB — CBC
HCT: 42.9 % (ref 39.0–52.0)
Hemoglobin: 14.7 g/dL (ref 13.0–17.0)
MCH: 31.8 pg (ref 26.0–34.0)
MCHC: 34.3 g/dL (ref 30.0–36.0)
MCV: 92.9 fL (ref 78.0–100.0)
Platelets: 203 10*3/uL (ref 150–400)
RBC: 4.62 MIL/uL (ref 4.22–5.81)
RDW: 13 % (ref 11.5–15.5)
WBC: 7.8 10*3/uL (ref 4.0–10.5)

## 2012-01-22 LAB — BLOOD GAS, ARTERIAL
Acid-base deficit: 1 mmol/L (ref 0.0–2.0)
Bicarbonate: 23.1 mEq/L (ref 20.0–24.0)
Drawn by: 344381
FIO2: 0.21 %
O2 Saturation: 96.3 %
Patient temperature: 98.6
TCO2: 24.2 mmol/L (ref 0–100)
pCO2 arterial: 37.6 mmHg (ref 35.0–45.0)
pH, Arterial: 7.404 (ref 7.350–7.450)
pO2, Arterial: 77.8 mmHg — ABNORMAL LOW (ref 80.0–100.0)

## 2012-01-22 LAB — COMPREHENSIVE METABOLIC PANEL
ALT: 16 U/L (ref 0–53)
AST: 19 U/L (ref 0–37)
Albumin: 3.3 g/dL — ABNORMAL LOW (ref 3.5–5.2)
Alkaline Phosphatase: 86 U/L (ref 39–117)
BUN: 14 mg/dL (ref 6–23)
CO2: 21 mEq/L (ref 19–32)
Calcium: 9.1 mg/dL (ref 8.4–10.5)
Chloride: 106 mEq/L (ref 96–112)
Creatinine, Ser: 0.83 mg/dL (ref 0.50–1.35)
GFR calc Af Amer: >90 mL/min (ref >90)
GFR calc non Af Amer: 80 mL/min — ABNORMAL LOW (ref >90)
Glucose, Bld: 105 mg/dL — ABNORMAL HIGH (ref 70–99)
Potassium: 4.3 mEq/L (ref 3.5–5.1)
Sodium: 137 mEq/L (ref 135–145)
Total Bilirubin: 0.5 mg/dL (ref 0.3–1.2)
Total Protein: 6.7 g/dL (ref 6.0–8.3)

## 2012-01-22 LAB — HEMOGLOBIN A1C
Hgb A1c MFr Bld: 5.7 % — ABNORMAL HIGH (ref <5.7)
Mean Plasma Glucose: 117 mg/dL — ABNORMAL HIGH (ref <117)

## 2012-01-22 LAB — SURGICAL PCR SCREEN
MRSA, PCR: NEGATIVE
Staphylococcus aureus: NEGATIVE

## 2012-01-22 LAB — ABO/RH: ABO/RH(D): B POS

## 2012-01-22 LAB — PROTIME-INR
INR: 1.06 (ref 0.00–1.49)
Prothrombin Time: 14 seconds (ref 11.6–15.2)

## 2012-01-22 LAB — APTT: aPTT: 29 seconds (ref 24–37)

## 2012-01-22 MED ORDER — CHLORHEXIDINE GLUCONATE 4 % EX LIQD: 30.0000 mL | CUTANEOUS | Status: DC

## 2012-01-22 NOTE — Progress Notes (Signed)
VASCULAR LAB PRELIMINARY  PRELIMINARY  PRELIMINARY  PRELIMINARY  Pre-op Cardiac Surgery  Carotid Findings : Bilateral:  No evidence of hemodynamically significant internal carotid artery stenosis.   Vertebral artery flow is antegrade.     Upper Extremity Right Left  Brachial Pressures 120  Ttiphasic 129 Triphasic  Radial Waveforms Biphasic Biphasic  Ulnar Waveforms Biphasic Biphasic  Palmar Arch (Allen's Test) Abnormal Abnormal   Findings:  Doppler waveforms obliterate with radial compressions and remain normal with ulnar compressions bilaterally    Lower  Extremity Right Left  Dorsalis Pedis 145 Biphasic 139 Biphasic  Anterior Tibial 148 Biphasic 139 Biphasic      Ankle/Brachial Indices 1.15 1.08    Findings:  ABIs indicate normal arterial flow with Doppler waveforms within normal limits bilaterally at rest.   Douglas [name] D, RVS 01/22/2012, 12:55 PM

## 2012-01-23 MED ORDER — NITROGLYCERIN IN D5W 200-5 MCG/ML-% IV SOLN
2.0000 ug/min | INTRAVENOUS | Status: AC
  Administered 2012-01-24: 20 ug/min via INTRAVENOUS
  Filled 2012-01-23: qty 250

## 2012-01-23 MED ORDER — SODIUM CHLORIDE 0.9 % IV SOLN
INTRAVENOUS | Status: AC
  Administered 2012-01-24: 1.3 [IU]/h via INTRAVENOUS
  Filled 2012-01-23: qty 1

## 2012-01-23 MED ORDER — PHENYLEPHRINE HCL 10 MG/ML IJ SOLN
30.0000 ug/min | INTRAVENOUS | Status: AC
  Administered 2012-01-24: 50 ug/min via INTRAVENOUS
  Filled 2012-01-23: qty 2

## 2012-01-23 MED ORDER — VANCOMYCIN HCL 1000 MG IV SOLR
1500.0000 mg | INTRAVENOUS | Status: AC
  Administered 2012-01-24: 1500 mg via INTRAVENOUS
  Filled 2012-01-23: qty 1500

## 2012-01-23 MED ORDER — EPINEPHRINE HCL 1 MG/ML IJ SOLN
0.5000 ug/min | INTRAVENOUS | Status: DC
  Filled 2012-01-23: qty 4

## 2012-01-23 MED ORDER — DEXTROSE 5 % IV SOLN
1.5000 g | INTRAVENOUS | Status: AC
  Administered 2012-01-24: 750 g via INTRAVENOUS
  Administered 2012-01-24: 1.5 g via INTRAVENOUS
  Filled 2012-01-23: qty 1.5

## 2012-01-23 MED ORDER — DOPAMINE-DEXTROSE 3.2-5 MG/ML-% IV SOLN
2.0000 ug/kg/min | INTRAVENOUS | Status: DC
  Filled 2012-01-23: qty 250

## 2012-01-23 MED ORDER — DEXTROSE 5 % IV SOLN
750.0000 mg | INTRAVENOUS | Status: DC
  Filled 2012-01-23 (×2): qty 750

## 2012-01-23 MED ORDER — SODIUM CHLORIDE 0.9 % IV SOLN
0.1000 ug/kg/h | INTRAVENOUS | Status: AC
  Administered 2012-01-24: .2 ug/kg/h via INTRAVENOUS
  Filled 2012-01-23: qty 4

## 2012-01-23 MED ORDER — METOPROLOL TARTRATE 12.5 MG HALF TABLET: 12.5000 mg | ORAL_TABLET | Freq: Once | ORAL | Status: DC

## 2012-01-23 MED ORDER — MAGNESIUM SULFATE 50 % IJ SOLN
40.0000 meq | INTRAMUSCULAR | Status: DC
  Filled 2012-01-23: qty 10

## 2012-01-23 MED ORDER — VERAPAMIL HCL 2.5 MG/ML IV SOLN
INTRAVENOUS | Status: AC
  Administered 2012-01-24: 10:00:00
  Filled 2012-01-23: qty 2.5

## 2012-01-23 MED ORDER — SODIUM CHLORIDE 0.9 % IV SOLN
INTRAVENOUS | Status: AC
  Administered 2012-01-24: 70 mL/h via INTRAVENOUS
  Filled 2012-01-23: qty 40

## 2012-01-23 MED ORDER — POTASSIUM CHLORIDE 2 MEQ/ML IV SOLN
80.0000 meq | INTRAVENOUS | Status: DC
  Filled 2012-01-23: qty 40

## 2012-01-24 ENCOUNTER — Encounter (HOSPITAL_COMMUNITY): Payer: Self-pay | Admitting: Anesthesiology

## 2012-01-24 ENCOUNTER — Other Ambulatory Visit: Payer: Self-pay

## 2012-01-24 ENCOUNTER — Encounter (HOSPITAL_COMMUNITY): Payer: Self-pay | Admitting: *Deleted

## 2012-01-24 ENCOUNTER — Inpatient Hospital Stay (HOSPITAL_COMMUNITY)
Admission: EM | Admit: 2012-01-24 | Discharge: 2012-02-06 | DRG: 220 | Disposition: A | Discharge status: Home Health | Payer: Medicare Other | Source: Ambulatory Visit | Attending: Cardiothoracic Surgery | Admitting: Cardiothoracic Surgery

## 2012-01-24 ENCOUNTER — Inpatient Hospital Stay (HOSPITAL_COMMUNITY): Payer: Medicare Other

## 2012-01-24 ENCOUNTER — Encounter (HOSPITAL_COMMUNITY)
Admission: EM | Disposition: A | Discharge status: Home Health | Payer: Self-pay | Source: Ambulatory Visit | Attending: Cardiothoracic Surgery

## 2012-01-24 ENCOUNTER — Ambulatory Visit (HOSPITAL_COMMUNITY): Payer: Medicare Other | Admitting: Anesthesiology

## 2012-01-24 DIAGNOSIS — Z7901 Long term (current) use of anticoagulants: Secondary | ICD-10-CM | POA: Diagnosis not present

## 2012-01-24 DIAGNOSIS — K224 Dyskinesia of esophagus: Secondary | ICD-10-CM | POA: Diagnosis present

## 2012-01-24 DIAGNOSIS — Z954 Presence of other heart-valve replacement: Secondary | ICD-10-CM | POA: Diagnosis not present

## 2012-01-24 DIAGNOSIS — J9819 Other pulmonary collapse: Secondary | ICD-10-CM | POA: Diagnosis not present

## 2012-01-24 DIAGNOSIS — I2510 Atherosclerotic heart disease of native coronary artery without angina pectoris: Secondary | ICD-10-CM | POA: Diagnosis not present

## 2012-01-24 DIAGNOSIS — R1310 Dysphagia, unspecified: Secondary | ICD-10-CM | POA: Diagnosis present

## 2012-01-24 DIAGNOSIS — I4891 Unspecified atrial fibrillation: Secondary | ICD-10-CM | POA: Diagnosis not present

## 2012-01-24 DIAGNOSIS — N289 Disorder of kidney and ureter, unspecified: Secondary | ICD-10-CM | POA: Diagnosis not present

## 2012-01-24 DIAGNOSIS — I359 Nonrheumatic aortic valve disorder, unspecified: Secondary | ICD-10-CM

## 2012-01-24 DIAGNOSIS — D72829 Elevated white blood cell count, unspecified: Secondary | ICD-10-CM | POA: Diagnosis not present

## 2012-01-24 DIAGNOSIS — I10 Essential (primary) hypertension: Secondary | ICD-10-CM | POA: Diagnosis present

## 2012-01-24 DIAGNOSIS — R05 Cough: Secondary | ICD-10-CM | POA: Diagnosis not present

## 2012-01-24 DIAGNOSIS — J90 Pleural effusion, not elsewhere classified: Secondary | ICD-10-CM | POA: Diagnosis not present

## 2012-01-24 DIAGNOSIS — Z452 Encounter for adjustment and management of vascular access device: Secondary | ICD-10-CM | POA: Diagnosis not present

## 2012-01-24 DIAGNOSIS — R5381 Other malaise: Secondary | ICD-10-CM | POA: Diagnosis not present

## 2012-01-24 DIAGNOSIS — I509 Heart failure, unspecified: Secondary | ICD-10-CM | POA: Diagnosis not present

## 2012-01-24 DIAGNOSIS — Z950 Presence of cardiac pacemaker: Secondary | ICD-10-CM | POA: Diagnosis not present

## 2012-01-24 DIAGNOSIS — I712 Thoracic aortic aneurysm, without rupture, unspecified: Secondary | ICD-10-CM | POA: Diagnosis present

## 2012-01-24 DIAGNOSIS — Z79899 Other long term (current) drug therapy: Secondary | ICD-10-CM

## 2012-01-24 DIAGNOSIS — IMO0001 Reserved for inherently not codable concepts without codable children: Secondary | ICD-10-CM | POA: Diagnosis not present

## 2012-01-24 DIAGNOSIS — D62 Acute posthemorrhagic anemia: Secondary | ICD-10-CM | POA: Diagnosis not present

## 2012-01-24 DIAGNOSIS — I319 Disease of pericardium, unspecified: Secondary | ICD-10-CM | POA: Diagnosis not present

## 2012-01-24 DIAGNOSIS — I517 Cardiomegaly: Secondary | ICD-10-CM | POA: Diagnosis not present

## 2012-01-24 DIAGNOSIS — Z7982 Long term (current) use of aspirin: Secondary | ICD-10-CM

## 2012-01-24 DIAGNOSIS — Z09 Encounter for follow-up examination after completed treatment for conditions other than malignant neoplasm: Secondary | ICD-10-CM | POA: Diagnosis not present

## 2012-01-24 DIAGNOSIS — Z9889 Other specified postprocedural states: Secondary | ICD-10-CM | POA: Diagnosis not present

## 2012-01-24 DIAGNOSIS — R918 Other nonspecific abnormal finding of lung field: Secondary | ICD-10-CM | POA: Diagnosis not present

## 2012-01-24 DIAGNOSIS — Z860100 Personal history of colon polyps, unspecified: Secondary | ICD-10-CM

## 2012-01-24 DIAGNOSIS — I714 Abdominal aortic aneurysm, without rupture: Secondary | ICD-10-CM | POA: Diagnosis not present

## 2012-01-24 DIAGNOSIS — Z951 Presence of aortocoronary bypass graft: Secondary | ICD-10-CM | POA: Diagnosis not present

## 2012-01-24 DIAGNOSIS — E8779 Other fluid overload: Secondary | ICD-10-CM | POA: Diagnosis not present

## 2012-01-24 DIAGNOSIS — I442 Atrioventricular block, complete: Secondary | ICD-10-CM | POA: Diagnosis not present

## 2012-01-24 DIAGNOSIS — R079 Chest pain, unspecified: Secondary | ICD-10-CM | POA: Diagnosis not present

## 2012-01-24 DIAGNOSIS — Z6835 Body mass index (BMI) 35.0-35.9, adult: Secondary | ICD-10-CM | POA: Diagnosis not present

## 2012-01-24 DIAGNOSIS — M1990 Unspecified osteoarthritis, unspecified site: Secondary | ICD-10-CM | POA: Diagnosis present

## 2012-01-24 HISTORY — PX: CORONARY ARTERY BYPASS GRAFT: SHX141

## 2012-01-24 HISTORY — PX: AORTIC VALVE REPLACEMENT: SHX41

## 2012-01-24 HISTORY — PX: BENTALL PROCEDURE: SHX5058

## 2012-01-24 LAB — CREATININE, SERUM
Creatinine, Ser: 0.77 mg/dL (ref 0.50–1.35)
GFR calc Af Amer: >90 mL/min (ref >90)
GFR calc non Af Amer: 82 mL/min — ABNORMAL LOW (ref >90)

## 2012-01-24 LAB — POCT I-STAT 3, ART BLOOD GAS (G3+)
Acid-Base Excess: 3 mmol/L — ABNORMAL HIGH (ref 0.0–2.0)
Acid-base deficit: 3 mmol/L — ABNORMAL HIGH (ref 0.0–2.0)
Acid-base deficit: 4 mmol/L — ABNORMAL HIGH (ref 0.0–2.0)
Acid-base deficit: 2 mmol/L (ref 0.0–2.0)
Bicarbonate: 25.5 mEq/L — ABNORMAL HIGH (ref 20.0–24.0)
Bicarbonate: 26.6 mEq/L — ABNORMAL HIGH (ref 20.0–24.0)
Bicarbonate: 20.4 mEq/L (ref 20.0–24.0)
Bicarbonate: 20.2 mEq/L (ref 20.0–24.0)
Bicarbonate: 22.3 mEq/L (ref 20.0–24.0)
O2 Saturation: 100 %
O2 Saturation: 100 %
O2 Saturation: 100 %
O2 Saturation: 91 %
O2 Saturation: 98 %
Patient temperature: 36.8
Patient temperature: 36.8
TCO2: 27 mmol/L (ref 0–100)
TCO2: 28 mmol/L (ref 0–100)
TCO2: 21 mmol/L (ref 0–100)
TCO2: 21 mmol/L (ref 0–100)
TCO2: 23 mmol/L (ref 0–100)
pCO2 arterial: 45.8 mmHg — ABNORMAL HIGH (ref 35.0–45.0)
pCO2 arterial: 34.8 mmHg — ABNORMAL LOW (ref 35.0–45.0)
pCO2 arterial: 31.2 mmHg — ABNORMAL LOW (ref 35.0–45.0)
pCO2 arterial: 33.7 mmHg — ABNORMAL LOW (ref 35.0–45.0)
pCO2 arterial: 34.9 mmHg — ABNORMAL LOW (ref 35.0–45.0)
pH, Arterial: 7.354 (ref 7.350–7.450)
pH, Arterial: 7.491 — ABNORMAL HIGH (ref 7.350–7.450)
pH, Arterial: 7.425 (ref 7.350–7.450)
pH, Arterial: 7.386 (ref 7.350–7.450)
pH, Arterial: 7.413 (ref 7.350–7.450)
pO2, Arterial: 495 mmHg — ABNORMAL HIGH (ref 80.0–100.0)
pO2, Arterial: 503 mmHg — ABNORMAL HIGH (ref 80.0–100.0)
pO2, Arterial: 210 mmHg — ABNORMAL HIGH (ref 80.0–100.0)
pO2, Arterial: 60 mmHg — ABNORMAL LOW (ref 80.0–100.0)
pO2, Arterial: 103 mmHg — ABNORMAL HIGH (ref 80.0–100.0)

## 2012-01-24 LAB — POCT I-STAT 4, (NA,K, GLUC, HGB,HCT)
Glucose, Bld: 104 mg/dL — ABNORMAL HIGH (ref 70–99)
Glucose, Bld: 126 mg/dL — ABNORMAL HIGH (ref 70–99)
Glucose, Bld: 106 mg/dL — ABNORMAL HIGH (ref 70–99)
Glucose, Bld: 147 mg/dL — ABNORMAL HIGH (ref 70–99)
Glucose, Bld: 144 mg/dL — ABNORMAL HIGH (ref 70–99)
Glucose, Bld: 134 mg/dL — ABNORMAL HIGH (ref 70–99)
Glucose, Bld: 126 mg/dL — ABNORMAL HIGH (ref 70–99)
Glucose, Bld: 139 mg/dL — ABNORMAL HIGH (ref 70–99)
Glucose, Bld: 159 mg/dL — ABNORMAL HIGH (ref 70–99)
Glucose, Bld: 140 mg/dL — ABNORMAL HIGH (ref 70–99)
HCT: 40 % (ref 39.0–52.0)
HCT: 37 % — ABNORMAL LOW (ref 39.0–52.0)
HCT: 27 % — ABNORMAL LOW (ref 39.0–52.0)
HCT: 24 % — ABNORMAL LOW (ref 39.0–52.0)
HCT: 25 % — ABNORMAL LOW (ref 39.0–52.0)
HCT: 27 % — ABNORMAL LOW (ref 39.0–52.0)
HCT: 26 % — ABNORMAL LOW (ref 39.0–52.0)
HCT: 22 % — ABNORMAL LOW (ref 39.0–52.0)
HCT: 26 % — ABNORMAL LOW (ref 39.0–52.0)
HCT: 28 % — ABNORMAL LOW (ref 39.0–52.0)
Hemoglobin: 13.6 g/dL (ref 13.0–17.0)
Hemoglobin: 12.6 g/dL — ABNORMAL LOW (ref 13.0–17.0)
Hemoglobin: 9.2 g/dL — ABNORMAL LOW (ref 13.0–17.0)
Hemoglobin: 8.2 g/dL — ABNORMAL LOW (ref 13.0–17.0)
Hemoglobin: 8.5 g/dL — ABNORMAL LOW (ref 13.0–17.0)
Hemoglobin: 9.2 g/dL — ABNORMAL LOW (ref 13.0–17.0)
Hemoglobin: 8.8 g/dL — ABNORMAL LOW (ref 13.0–17.0)
Hemoglobin: 7.5 g/dL — ABNORMAL LOW (ref 13.0–17.0)
Hemoglobin: 8.8 g/dL — ABNORMAL LOW (ref 13.0–17.0)
Hemoglobin: 9.5 g/dL — ABNORMAL LOW (ref 13.0–17.0)
Potassium: 4.2 mEq/L (ref 3.5–5.1)
Potassium: 4.3 mEq/L (ref 3.5–5.1)
Potassium: 4 mEq/L (ref 3.5–5.1)
Potassium: 4.6 mEq/L (ref 3.5–5.1)
Potassium: 3.9 mEq/L (ref 3.5–5.1)
Potassium: 4.4 mEq/L (ref 3.5–5.1)
Potassium: 3.9 mEq/L (ref 3.5–5.1)
Potassium: 4.3 mEq/L (ref 3.5–5.1)
Potassium: 4 mEq/L (ref 3.5–5.1)
Potassium: 4 mEq/L (ref 3.5–5.1)
Sodium: 140 mEq/L (ref 135–145)
Sodium: 139 mEq/L (ref 135–145)
Sodium: 142 mEq/L (ref 135–145)
Sodium: 137 mEq/L (ref 135–145)
Sodium: 138 mEq/L (ref 135–145)
Sodium: 139 mEq/L (ref 135–145)
Sodium: 140 mEq/L (ref 135–145)
Sodium: 137 mEq/L (ref 135–145)
Sodium: 139 mEq/L (ref 135–145)
Sodium: 141 mEq/L (ref 135–145)

## 2012-01-24 LAB — CBC
HCT: 24.4 % — ABNORMAL LOW (ref 39.0–52.0)
Hemoglobin: 8.6 g/dL — ABNORMAL LOW (ref 13.0–17.0)
MCH: 32.3 pg (ref 26.0–34.0)
MCHC: 35.2 g/dL (ref 30.0–36.0)
MCV: 91.7 fL (ref 78.0–100.0)
Platelets: 146 10*3/uL — ABNORMAL LOW (ref 150–400)
Platelets: 131 10*3/uL — ABNORMAL LOW (ref 150–400)
RBC: 3.14 MIL/uL — ABNORMAL LOW (ref 4.22–5.81)
RBC: 2.66 MIL/uL — ABNORMAL LOW (ref 4.22–5.81)
RDW: 13.2 % (ref 11.5–15.5)
RDW: 13.3 % (ref 11.5–15.5)
WBC: 18.9 10*3/uL — ABNORMAL HIGH (ref 4.0–10.5)
WBC: 16.8 10*3/uL — ABNORMAL HIGH (ref 4.0–10.5)

## 2012-01-24 LAB — HEMOGLOBIN AND HEMATOCRIT, BLOOD
HCT: 26.4 % — ABNORMAL LOW (ref 39.0–52.0)
Hemoglobin: 9.4 g/dL — ABNORMAL LOW (ref 13.0–17.0)

## 2012-01-24 LAB — POCT I-STAT, CHEM 8
BUN: 11 mg/dL (ref 6–23)
Calcium, Ion: 1.28 mmol/L (ref 1.12–1.32)
Chloride: 110 mEq/L (ref 96–112)
Creatinine, Ser: 0.9 mg/dL (ref 0.50–1.35)
Glucose, Bld: 110 mg/dL — ABNORMAL HIGH (ref 70–99)
HCT: 22 % — ABNORMAL LOW (ref 39.0–52.0)
Hemoglobin: 7.5 g/dL — ABNORMAL LOW (ref 13.0–17.0)
Potassium: 3.8 mEq/L (ref 3.5–5.1)
Sodium: 141 mEq/L (ref 135–145)
TCO2: 22 mmol/L (ref 0–100)

## 2012-01-24 LAB — MAGNESIUM: Magnesium: 2.8 mg/dL — ABNORMAL HIGH (ref 1.5–2.5)

## 2012-01-24 LAB — PROTIME-INR: Prothrombin Time: 19.2 seconds — ABNORMAL HIGH (ref 11.6–15.2)

## 2012-01-24 LAB — POCT I-STAT GLUCOSE
Glucose, Bld: 104 mg/dL — ABNORMAL HIGH (ref 70–99)
Operator id: 156951

## 2012-01-24 LAB — PLATELET COUNT: Platelets: 129 10*3/uL — ABNORMAL LOW (ref 150–400)

## 2012-01-24 LAB — APTT: aPTT: 40 seconds — ABNORMAL HIGH (ref 24–37)

## 2012-01-24 SURGERY — BENTALL PROCEDURE
Anesthesia: General | Site: Chest | Wound class: Clean

## 2012-01-24 MED ORDER — DOPAMINE-DEXTROSE 3.2-5 MG/ML-% IV SOLN
INTRAVENOUS | Status: DC | PRN
  Administered 2012-01-24: 3 ug/kg/min via INTRAVENOUS

## 2012-01-24 MED ORDER — BISACODYL 10 MG RE SUPP: 10.0000 mg | Freq: Every day | RECTAL | Status: DC

## 2012-01-24 MED ORDER — ALBUMIN HUMAN 5 % IV SOLN
12.5000 g | Freq: Once | INTRAVENOUS | Status: AC
  Administered 2012-01-24: 12.5 g via INTRAVENOUS

## 2012-01-24 MED ORDER — BISACODYL 5 MG PO TBEC
10.0000 mg | DELAYED_RELEASE_TABLET | Freq: Every day | ORAL | Status: DC
  Administered 2012-01-26 – 2012-01-28 (×3): 10 mg via ORAL
  Filled 2012-01-24 (×3): qty 2

## 2012-01-24 MED ORDER — PHENYLEPHRINE HCL 10 MG/ML IJ SOLN
0.0000 ug/min | INTRAMUSCULAR | Status: DC
  Filled 2012-01-24 (×3): qty 2

## 2012-01-24 MED ORDER — DOPAMINE-DEXTROSE 3.2-5 MG/ML-% IV SOLN
3.0000 ug/kg/min | INTRAVENOUS | Status: DC
  Filled 2012-01-24: qty 250

## 2012-01-24 MED ORDER — MIDAZOLAM HCL 5 MG/5ML IJ SOLN
INTRAMUSCULAR | Status: DC | PRN
  Administered 2012-01-24: 1 mg via INTRAVENOUS
  Administered 2012-01-24: 5 mg via INTRAVENOUS
  Administered 2012-01-24 (×4): 2 mg via INTRAVENOUS

## 2012-01-24 MED ORDER — SODIUM CHLORIDE 0.9 % IV SOLN
20.0000 ug | Freq: Once | INTRAVENOUS | Status: AC
  Administered 2012-01-24: 20 ug via INTRAVENOUS
  Filled 2012-01-24: qty 5

## 2012-01-24 MED ORDER — LACTATED RINGERS IV SOLN: 500.0000 mL | Freq: Once | INTRAVENOUS | Status: AC | PRN

## 2012-01-24 MED ORDER — SODIUM CHLORIDE 0.9 % IV SOLN: 250.0000 mL | INTRAVENOUS | Status: DC

## 2012-01-24 MED ORDER — MIDAZOLAM HCL 2 MG/2ML IJ SOLN
2.0000 mg | INTRAMUSCULAR | Status: DC | PRN
  Administered 2012-01-24 – 2012-01-25 (×2): 2 mg via INTRAVENOUS
  Filled 2012-01-24 (×2): qty 2

## 2012-01-24 MED ORDER — SODIUM CHLORIDE 0.45 % IV SOLN
INTRAVENOUS | Status: DC
  Administered 2012-01-24: 20 mL/h via INTRAVENOUS

## 2012-01-24 MED ORDER — ALBUMIN HUMAN 5 % IV SOLN
250.0000 mL | INTRAVENOUS | Status: AC | PRN
  Administered 2012-01-24 – 2012-01-25 (×4): 250 mL via INTRAVENOUS
  Filled 2012-01-24 (×3): qty 250

## 2012-01-24 MED ORDER — ASPIRIN 81 MG PO CHEW
324.0000 mg | CHEWABLE_TABLET | Freq: Every day | ORAL | Status: DC
  Administered 2012-01-28: 324 mg
  Filled 2012-01-24: qty 4

## 2012-01-24 MED ORDER — SODIUM CHLORIDE 0.9 % IV SOLN
INTRAVENOUS | Status: DC | PRN
  Administered 2012-01-24: 17:00:00 via INTRAVENOUS

## 2012-01-24 MED ORDER — SODIUM CHLORIDE 0.9 % IR SOLN
Status: DC | PRN
  Administered 2012-01-24: 6000 mL

## 2012-01-24 MED ORDER — DOCUSATE SODIUM 100 MG PO CAPS
200.0000 mg | ORAL_CAPSULE | Freq: Every day | ORAL | Status: DC
  Administered 2012-01-26 – 2012-01-29 (×4): 200 mg via ORAL
  Filled 2012-01-24 (×3): qty 2
  Filled 2012-01-24: qty 1
  Filled 2012-01-24 (×2): qty 2
  Filled 2012-01-24: qty 1

## 2012-01-24 MED ORDER — VANCOMYCIN HCL 1000 MG IV SOLR
1000.0000 mg | Freq: Once | INTRAVENOUS | Status: AC
  Administered 2012-01-24: 1000 mg via INTRAVENOUS
  Filled 2012-01-24: qty 1000

## 2012-01-24 MED ORDER — MORPHINE SULFATE 2 MG/ML IJ SOLN
1.0000 mg | INTRAMUSCULAR | Status: AC | PRN
  Administered 2012-01-24 – 2012-01-25 (×5): 2 mg via INTRAVENOUS
  Administered 2012-01-25: 4 mg via INTRAVENOUS
  Filled 2012-01-24: qty 1

## 2012-01-24 MED ORDER — DEXTROSE 5 % IV SOLN
1.5000 g | Freq: Two times a day (BID) | INTRAVENOUS | Status: AC
  Administered 2012-01-24 – 2012-01-26 (×4): 1.5 g via INTRAVENOUS
  Filled 2012-01-24 (×5): qty 1.5

## 2012-01-24 MED ORDER — SODIUM CHLORIDE 0.9 % IJ SOLN
OROMUCOSAL | Status: DC | PRN
  Administered 2012-01-24 (×3): via TOPICAL

## 2012-01-24 MED ORDER — LACTATED RINGERS IV SOLN
INTRAVENOUS | Status: DC | PRN
  Administered 2012-01-24: 07:00:00 via INTRAVENOUS

## 2012-01-24 MED ORDER — MORPHINE SULFATE 4 MG/ML IJ SOLN
2.0000 mg | INTRAMUSCULAR | Status: DC | PRN
  Administered 2012-01-27 (×2): 2 mg via INTRAVENOUS
  Filled 2012-01-24 (×4): qty 1

## 2012-01-24 MED ORDER — HEMOSTATIC AGENTS (NO CHARGE) OPTIME
TOPICAL | Status: DC | PRN
  Administered 2012-01-24: 1 via TOPICAL

## 2012-01-24 MED ORDER — HEPARIN SODIUM (PORCINE) 1000 UNIT/ML IJ SOLN
INTRAMUSCULAR | Status: DC | PRN
  Administered 2012-01-24: 5000 [IU] via INTRAVENOUS
  Administered 2012-01-24: 42000 [IU] via INTRAVENOUS

## 2012-01-24 MED ORDER — METOPROLOL TARTRATE 1 MG/ML IV SOLN: 2.5000 mg | INTRAVENOUS | Status: DC | PRN

## 2012-01-24 MED ORDER — ACETAMINOPHEN 650 MG RE SUPP
650.0000 mg | RECTAL | Status: AC
  Administered 2012-01-24: 650 mg via RECTAL

## 2012-01-24 MED ORDER — FAMOTIDINE IN NACL 20-0.9 MG/50ML-% IV SOLN
20.0000 mg | Freq: Two times a day (BID) | INTRAVENOUS | Status: AC
  Administered 2012-01-24 – 2012-01-25 (×2): 20 mg via INTRAVENOUS
  Filled 2012-01-24: qty 50

## 2012-01-24 MED ORDER — LACTATED RINGERS IV SOLN: INTRAVENOUS | Status: DC

## 2012-01-24 MED ORDER — MAGNESIUM SULFATE 40 MG/ML IJ SOLN
4.0000 g | Freq: Once | INTRAMUSCULAR | Status: AC
  Administered 2012-01-24: 4 g via INTRAVENOUS
  Filled 2012-01-24: qty 100

## 2012-01-24 MED ORDER — SODIUM CHLORIDE 0.9 % IV SOLN
0.1000 ug/kg/h | INTRAVENOUS | Status: DC
  Administered 2012-01-24: 0.7 ug/kg/h via INTRAVENOUS
  Administered 2012-01-24: 0.5 ug/kg/h via INTRAVENOUS
  Administered 2012-01-25: 0.7 ug/kg/h via INTRAVENOUS
  Filled 2012-01-24 (×5): qty 2

## 2012-01-24 MED ORDER — SODIUM CHLORIDE 0.9 % IV SOLN
0.5000 g/h | INTRAVENOUS | Status: DC
  Filled 2012-01-24: qty 20

## 2012-01-24 MED ORDER — POTASSIUM CHLORIDE 10 MEQ/50ML IV SOLN: 10.0000 meq | INTRAVENOUS | Status: AC

## 2012-01-24 MED ORDER — OXYCODONE HCL 5 MG PO TABS
5.0000 mg | ORAL_TABLET | ORAL | Status: DC | PRN
Start: 2012-01-24 — End: 2012-01-25

## 2012-01-24 MED ORDER — ONDANSETRON HCL 4 MG/2ML IJ SOLN
4.0000 mg | Freq: Four times a day (QID) | INTRAMUSCULAR | Status: DC | PRN
  Administered 2012-01-26: 4 mg via INTRAVENOUS
  Filled 2012-01-24: qty 2

## 2012-01-24 MED ORDER — CALCIUM CHLORIDE 10 % IV SOLN
1.0000 g | Freq: Once | INTRAVENOUS | Status: AC
  Administered 2012-01-24: 1 g via INTRAVENOUS
  Filled 2012-01-24: qty 10

## 2012-01-24 MED ORDER — PROPOFOL 10 MG/ML IV EMUL
INTRAVENOUS | Status: DC | PRN
  Administered 2012-01-24: 80 mg via INTRAVENOUS

## 2012-01-24 MED ORDER — DEXTROSE 5 % IV SOLN
INTRAVENOUS | Status: DC | PRN
  Administered 2012-01-24 (×2): via INTRAVENOUS

## 2012-01-24 MED ORDER — SODIUM BICARBONATE 8.4 % IV SOLN
25.0000 meq | Freq: Once | INTRAVENOUS | Status: AC
  Administered 2012-01-24: 25 meq via INTRAVENOUS

## 2012-01-24 MED ORDER — ALBUMIN HUMAN 5 % IV SOLN
INTRAVENOUS | Status: DC | PRN
  Administered 2012-01-24: 18:00:00 via INTRAVENOUS

## 2012-01-24 MED ORDER — PROTAMINE SULFATE 10 MG/ML IV SOLN
INTRAVENOUS | Status: DC | PRN
  Administered 2012-01-24: 170 mg via INTRAVENOUS

## 2012-01-24 MED ORDER — PANTOPRAZOLE SODIUM 40 MG PO TBEC
40.0000 mg | DELAYED_RELEASE_TABLET | Freq: Every day | ORAL | Status: DC
  Administered 2012-01-26 – 2012-02-05 (×10): 40 mg via ORAL
  Filled 2012-01-24 (×9): qty 1

## 2012-01-24 MED ORDER — NITROGLYCERIN IN D5W 200-5 MCG/ML-% IV SOLN: 0.0000 ug/min | INTRAVENOUS | Status: DC

## 2012-01-24 MED ORDER — SODIUM CHLORIDE 0.9 % IV SOLN
INTRAVENOUS | Status: DC
  Administered 2012-01-24: 10 mL/h via INTRAVENOUS

## 2012-01-24 MED ORDER — ACETAMINOPHEN 160 MG/5ML PO SOLN: 650.0000 mg | ORAL | Status: AC

## 2012-01-24 MED ORDER — SODIUM CHLORIDE 0.9 % IV SOLN
INTRAVENOUS | Status: DC
  Filled 2012-01-24: qty 1

## 2012-01-24 MED ORDER — ASPIRIN EC 325 MG PO TBEC
325.0000 mg | DELAYED_RELEASE_TABLET | Freq: Every day | ORAL | Status: DC
  Administered 2012-01-26 – 2012-01-27 (×2): 325 mg via ORAL
  Filled 2012-01-24 (×4): qty 1

## 2012-01-24 MED ORDER — VECURONIUM BROMIDE 10 MG IV SOLR
INTRAVENOUS | Status: DC | PRN
  Administered 2012-01-24 (×2): 2 mg via INTRAVENOUS
  Administered 2012-01-24: 3 mg via INTRAVENOUS
  Administered 2012-01-24: 2 mg via INTRAVENOUS
  Administered 2012-01-24: 8 mg via INTRAVENOUS
  Administered 2012-01-24 (×4): 2 mg via INTRAVENOUS
  Administered 2012-01-24: 3 mg via INTRAVENOUS
  Administered 2012-01-24 (×5): 2 mg via INTRAVENOUS

## 2012-01-24 MED ORDER — SODIUM CHLORIDE 0.9 % IJ SOLN: 3.0000 mL | INTRAMUSCULAR | Status: DC | PRN

## 2012-01-24 MED ORDER — ACETAMINOPHEN 160 MG/5ML PO SOLN: 975.0000 mg | Freq: Four times a day (QID) | ORAL | Status: DC

## 2012-01-24 MED ORDER — LACTATED RINGERS IV SOLN
INTRAVENOUS | Status: DC | PRN
  Administered 2012-01-24 (×2): via INTRAVENOUS

## 2012-01-24 MED ORDER — SODIUM CHLORIDE 0.9 % IJ SOLN
3.0000 mL | Freq: Two times a day (BID) | INTRAMUSCULAR | Status: DC
  Administered 2012-01-25 – 2012-01-27 (×6): 3 mL via INTRAVENOUS

## 2012-01-24 MED ORDER — ACETAMINOPHEN 500 MG PO TABS
1000.0000 mg | ORAL_TABLET | Freq: Four times a day (QID) | ORAL | Status: DC
  Administered 2012-01-25 – 2012-01-28 (×11): 1000 mg via ORAL
  Filled 2012-01-24 (×23): qty 2

## 2012-01-24 MED ORDER — INSULIN REGULAR BOLUS VIA INFUSION
0.0000 [IU] | Freq: Three times a day (TID) | INTRAVENOUS | Status: DC
  Filled 2012-01-24: qty 10

## 2012-01-24 MED ORDER — METOPROLOL TARTRATE 25 MG/10 ML ORAL SUSPENSION
12.5000 mg | Freq: Two times a day (BID) | ORAL | Status: DC
  Filled 2012-01-24 (×7): qty 5

## 2012-01-24 MED ORDER — METOPROLOL TARTRATE 12.5 MG HALF TABLET
12.5000 mg | ORAL_TABLET | Freq: Two times a day (BID) | ORAL | Status: DC
  Filled 2012-01-24 (×7): qty 1

## 2012-01-24 MED ORDER — SUFENTANIL CITRATE 50 MCG/ML IV SOLN
INTRAVENOUS | Status: DC | PRN
  Administered 2012-01-24: 30 ug via INTRAVENOUS
  Administered 2012-01-24: 5 ug via INTRAVENOUS
  Administered 2012-01-24: 20 ug via INTRAVENOUS
  Administered 2012-01-24: 30 ug via INTRAVENOUS
  Administered 2012-01-24: 20 ug via INTRAVENOUS
  Administered 2012-01-24: 10 ug via INTRAVENOUS
  Administered 2012-01-24: 30 ug via INTRAVENOUS
  Administered 2012-01-24: 5 ug via INTRAVENOUS
  Administered 2012-01-24: 20 ug via INTRAVENOUS

## 2012-01-24 MED FILL — Lactated Ringer's Solution: INTRAVENOUS | Qty: 500 | Status: AC

## 2012-01-24 MED FILL — Nitroglycerin IV Soln 5 MG/ML: INTRAVENOUS | Qty: 10 | Status: AC

## 2012-01-24 MED FILL — Verapamil HCl IV Soln 2.5 MG/ML: INTRAVENOUS | Qty: 4 | Status: AC

## 2012-01-24 MED FILL — Heparin Sodium (Porcine) Inj 1000 Unit/ML: INTRAMUSCULAR | Qty: 10 | Status: AC

## 2012-01-24 MED FILL — Magnesium Sulfate Inj 50%: INTRAMUSCULAR | Qty: 10 | Status: AC

## 2012-01-24 MED FILL — Potassium Chloride Inj 2 mEq/ML: INTRAVENOUS | Qty: 40 | Status: AC

## 2012-01-24 SURGICAL SUPPLY — 170 items
ADAPTER CARDIO PERF ANTE/RETRO (ADAPTER) ×3 IMPLANT
ADH SRG 12 PREFL SYR 3 SPRDR (MISCELLANEOUS)
ADPR PRFSN 84XANTGRD RTRGD (ADAPTER) ×1
APPLIER CLIP 9.375 MED OPEN (MISCELLANEOUS)
APPLIER CLIP 9.375 SM OPEN (CLIP)
APR CLP MED 9.3 20 MLT OPN (MISCELLANEOUS)
APR CLP SM 9.3 20 MLT OPN (CLIP)
ATTRACTOMAT 16X20 MAGNETIC DRP (DRAPES) ×3 IMPLANT
BAG DECANTER FOR FLEXI CONT (MISCELLANEOUS) ×3 IMPLANT
BANDAGE ELASTIC 4 VELCRO ST LF (GAUZE/BANDAGES/DRESSINGS) ×3 IMPLANT
BANDAGE ELASTIC 6 VELCRO ST LF (GAUZE/BANDAGES/DRESSINGS) ×3 IMPLANT
BANDAGE GAUZE ELAST BULKY 4 IN (GAUZE/BANDAGES/DRESSINGS) ×3 IMPLANT
BASKET HEART  (ORDER IN 25'S) (MISCELLANEOUS) ×1
BASKET HEART (ORDER IN 25'S) (MISCELLANEOUS) ×1
BASKET HEART (ORDER IN 25S) (MISCELLANEOUS) ×1 IMPLANT
BLADE SAW STERNAL (BLADE) IMPLANT
BLADE STERNUM SYSTEM 6 (BLADE) ×2 IMPLANT
BLADE SURG 12 STRL SS (BLADE) ×3 IMPLANT
BLADE SURG 15 STRL LF DISP TIS (BLADE) IMPLANT
BLADE SURG 15 STRL SS (BLADE)
BLADE SURG ROTATE 9660 (MISCELLANEOUS) ×2 IMPLANT
CANISTER SUCTION 2500CC (MISCELLANEOUS) ×3 IMPLANT
CANNULA GUNDRY RCSP 15FR (MISCELLANEOUS) ×1 IMPLANT
CANNULA OPTISITE PERFUSION 20F (CANNULA) ×2 IMPLANT
CANNULA SOFTFLOW AORTIC 7M21FR (CANNULA) ×2 IMPLANT
CATH CPB KIT VANTRIGT (MISCELLANEOUS) ×3 IMPLANT
CATH RETROPLEGIA CORONARY 14FR (CATHETERS) ×2 IMPLANT
CATH ROBINSON RED A/P 18FR (CATHETERS) ×11 IMPLANT
CATH THORACIC 28FR (CATHETERS) IMPLANT
CATH THORACIC 28FR RT ANG (CATHETERS) IMPLANT
CATH THORACIC 36FR (CATHETERS) IMPLANT
CATH THORACIC 36FR RT ANG (CATHETERS) ×6 IMPLANT
CAUTERY EYE LOW TEMP 1300F FIN (OPHTHALMIC RELATED) ×3 IMPLANT
CLIP APPLIE 9.375 MED OPEN (MISCELLANEOUS) IMPLANT
CLIP APPLIE 9.375 SM OPEN (CLIP) IMPLANT
CLIP FOGARTY SPRING 6M (CLIP) IMPLANT
CLIP TI MEDIUM 24 (CLIP) IMPLANT
CLIP TI WIDE RED SMALL 24 (CLIP) ×2 IMPLANT
CLOTH BEACON ORANGE TIMEOUT ST (SAFETY) ×3 IMPLANT
CONN Y 3/8X3/8X3/8  BEN (MISCELLANEOUS)
CONN Y 3/8X3/8X3/8 BEN (MISCELLANEOUS) IMPLANT
COVER SURGICAL LIGHT HANDLE (MISCELLANEOUS) ×6 IMPLANT
CRADLE DONUT ADULT HEAD (MISCELLANEOUS) ×3 IMPLANT
DRAIN CHANNEL 32F RND 10.7 FF (WOUND CARE) ×1 IMPLANT
DRAPE CARDIOVASCULAR INCISE (DRAPES) ×3
DRAPE SLUSH MACHINE 52X66 (DRAPES) ×3 IMPLANT
DRAPE SLUSH/WARMER DISC (DRAPES) IMPLANT
DRAPE SRG 135X102X78XABS (DRAPES) ×1 IMPLANT
DRSG COVADERM 4X14 (GAUZE/BANDAGES/DRESSINGS) ×3 IMPLANT
ELECT BLADE 4.0 EZ CLEAN MEGAD (MISCELLANEOUS) ×3
ELECT BLADE 6.5 EXT (BLADE) ×3 IMPLANT
ELECT CAUTERY BLADE 6.4 (BLADE) ×3 IMPLANT
ELECT REM PT RETURN 9FT ADLT (ELECTROSURGICAL) ×6
ELECTRODE BLDE 4.0 EZ CLN MEGD (MISCELLANEOUS) ×1 IMPLANT
ELECTRODE REM PT RTRN 9FT ADLT (ELECTROSURGICAL) ×2 IMPLANT
GLOVE BIO SURGEON STRL SZ 6 (GLOVE) ×4 IMPLANT
GLOVE BIO SURGEON STRL SZ 6.5 (GLOVE) ×1 IMPLANT
GLOVE BIO SURGEON STRL SZ7 (GLOVE) IMPLANT
GLOVE BIO SURGEON STRL SZ7.5 (GLOVE) ×12 IMPLANT
GLOVE BIO SURGEONS STRL SZ 6.5 (GLOVE) ×1
GLOVE BIOGEL PI IND STRL 6 (GLOVE) IMPLANT
GLOVE BIOGEL PI IND STRL 6.5 (GLOVE) IMPLANT
GLOVE BIOGEL PI IND STRL 7.0 (GLOVE) IMPLANT
GLOVE BIOGEL PI INDICATOR 6 (GLOVE)
GLOVE BIOGEL PI INDICATOR 6.5 (GLOVE) ×4
GLOVE BIOGEL PI INDICATOR 7.0 (GLOVE) ×2
GLOVE EUDERMIC 7 POWDERFREE (GLOVE) IMPLANT
GLOVE ORTHO TXT STRL SZ7.5 (GLOVE) IMPLANT
GOWN EXTRA PROTECTION XL (GOWNS) ×4 IMPLANT
GOWN STRL NON-REIN LRG LVL3 (GOWN DISPOSABLE) ×12 IMPLANT
GRAFT GELWEAVE VALSALVA 28 (Prosthesis & Implant Heart) IMPLANT
GRAFT GELWEAVE VALSALVA 28CM (Prosthesis & Implant Heart) ×3 IMPLANT
HEMOSTAT POWDER SURGIFOAM 1G (HEMOSTASIS) ×9 IMPLANT
HEMOSTAT SURGICEL 2X14 (HEMOSTASIS) ×7 IMPLANT
INSERT FOGARTY 61MM (MISCELLANEOUS) IMPLANT
INSERT FOGARTY XLG (MISCELLANEOUS) ×2 IMPLANT
KIT BASIN OR (CUSTOM PROCEDURE TRAY) ×3 IMPLANT
KIT PAIN CUSTOM (MISCELLANEOUS) IMPLANT
KIT ROOM TURNOVER OR (KITS) ×3 IMPLANT
KIT SUCTION CATH 14FR (SUCTIONS) ×3 IMPLANT
KIT VASOVIEW W/TROCAR VH 2000 (KITS) ×3 IMPLANT
LEAD PACING MYOCARDI (MISCELLANEOUS) ×3 IMPLANT
LINE EXTENSION DELIVERY (MISCELLANEOUS) ×2 IMPLANT
LINE VENT (MISCELLANEOUS) ×2 IMPLANT
MARKER GRAFT CORONARY BYPASS (MISCELLANEOUS) ×9 IMPLANT
MATRIX HEMOSTAT SURGIFLO (HEMOSTASIS) ×2 IMPLANT
NDL SUT 1 .5 CRC FRENCH EYE (NEEDLE) IMPLANT
NEEDLE FRENCH EYE (NEEDLE) ×3
NS IRRIG 1000ML POUR BTL (IV SOLUTION) ×21 IMPLANT
PACK OPEN HEART (CUSTOM PROCEDURE TRAY) ×3 IMPLANT
PAD ARMBOARD 7.5X6 YLW CONV (MISCELLANEOUS) ×6 IMPLANT
PENCIL BUTTON HOLSTER BLD 10FT (ELECTRODE) ×3 IMPLANT
PUNCH AORTIC ROTATE 4.0MM (MISCELLANEOUS) IMPLANT
PUNCH AORTIC ROTATE 4.5MM 8IN (MISCELLANEOUS) IMPLANT
PUNCH AORTIC ROTATE 5MM 8IN (MISCELLANEOUS) IMPLANT
SENSOR MYOCARDIAL TEMP (MISCELLANEOUS) ×2 IMPLANT
SET CARDIOPLEGIA MPS 5001102 (MISCELLANEOUS) ×2 IMPLANT
SOLUTION ANTI FOG 6CC (MISCELLANEOUS) IMPLANT
SPONGE GAUZE 4X4 12PLY (GAUZE/BANDAGES/DRESSINGS) ×8 IMPLANT
SPONGE INTESTINAL PEANUT (DISPOSABLE) IMPLANT
SPONGE LAP 18X18 X RAY DECT (DISPOSABLE) ×4 IMPLANT
SPONGE LAP 4X18 X RAY DECT (DISPOSABLE) ×2 IMPLANT
SPONGE SURGIFOAM ABS GEL 100 (HEMOSTASIS) ×6 IMPLANT
STOPCOCK 4 WAY LG BORE MALE ST (IV SETS) ×2 IMPLANT
SURGIFLO W/THROMBIN 8M KIT (HEMOSTASIS) ×8 IMPLANT
SUT BONE WAX W31G (SUTURE) ×3 IMPLANT
SUT ETHIBON 2 0 V 52N 30 (SUTURE) ×5 IMPLANT
SUT ETHIBON EXCEL 2-0 V-5 (SUTURE) IMPLANT
SUT ETHIBOND 2 0 SH (SUTURE)
SUT ETHIBOND 2 0 SH 36X2 (SUTURE) IMPLANT
SUT ETHIBOND 2 0 V4 (SUTURE) IMPLANT
SUT ETHIBOND 2 0V4 GREEN (SUTURE) IMPLANT
SUT ETHIBOND 4 0 RB 1 (SUTURE) IMPLANT
SUT ETHIBOND V-5 VALVE (SUTURE) IMPLANT
SUT GORETEX CV 4 TH 22 36 (SUTURE) ×4 IMPLANT
SUT GORETEX CV4 TH-18 (SUTURE) ×6 IMPLANT
SUT MNCRL AB 4-0 PS2 18 (SUTURE) IMPLANT
SUT PROLENE 3 0 RB 1 (SUTURE) ×2 IMPLANT
SUT PROLENE 3 0 SH 1 (SUTURE) IMPLANT
SUT PROLENE 3 0 SH DA (SUTURE) IMPLANT
SUT PROLENE 3 0 SH1 36 (SUTURE) IMPLANT
SUT PROLENE 4 0 RB 1 (SUTURE) ×84
SUT PROLENE 4 0 SH DA (SUTURE) ×23 IMPLANT
SUT PROLENE 4-0 RB1 .5 CRCL 36 (SUTURE) ×1 IMPLANT
SUT PROLENE 5 0 C 1 36 (SUTURE) ×10 IMPLANT
SUT PROLENE 6 0 C 1 30 (SUTURE) ×44 IMPLANT
SUT PROLENE 6 0 CC (SUTURE) IMPLANT
SUT PROLENE 7 0 BV 1 (SUTURE) IMPLANT
SUT PROLENE 7 0 BV1 MDA (SUTURE) IMPLANT
SUT PROLENE 7 0 DA (SUTURE) IMPLANT
SUT PROLENE 7.0 RB 3 (SUTURE) ×9 IMPLANT
SUT PROLENE 8 0 BV175 6 (SUTURE) IMPLANT
SUT PROLENE BLUE 7 0 (SUTURE) ×7 IMPLANT
SUT PROLENE POLY MONO (SUTURE) IMPLANT
SUT SILK  1 MH (SUTURE) ×4
SUT SILK 1 MH (SUTURE) ×2 IMPLANT
SUT SILK 1 TIES 10X30 (SUTURE) ×3 IMPLANT
SUT SILK 2 0 (SUTURE) ×3
SUT SILK 2 0 SH CR/8 (SUTURE) ×8 IMPLANT
SUT SILK 2-0 18XBRD TIE 12 (SUTURE) ×1 IMPLANT
SUT SILK 3 0 SH CR/8 (SUTURE) ×3 IMPLANT
SUT SILK 4 0 (SUTURE) ×3
SUT SILK 4-0 18XBRD TIE 12 (SUTURE) ×1 IMPLANT
SUT STEEL 6MS V (SUTURE) ×4 IMPLANT
SUT STEEL STERNAL CCS#1 18IN (SUTURE) IMPLANT
SUT STEEL SZ 6 DBL 3X14 BALL (SUTURE) ×3 IMPLANT
SUT TEM PAC WIRE 2 0 SH (SUTURE) ×12 IMPLANT
SUT VIC AB 1 CTX 18 (SUTURE) IMPLANT
SUT VIC AB 1 CTX 36 (SUTURE) ×6
SUT VIC AB 1 CTX36XBRD ANBCTR (SUTURE) ×2 IMPLANT
SUT VIC AB 2-0 CT1 27 (SUTURE) ×3
SUT VIC AB 2-0 CT1 TAPERPNT 27 (SUTURE) IMPLANT
SUT VIC AB 2-0 CTX 27 (SUTURE) ×6 IMPLANT
SUT VIC AB 3-0 SH 27 (SUTURE)
SUT VIC AB 3-0 SH 27X BRD (SUTURE) IMPLANT
SUT VIC AB 3-0 X1 27 (SUTURE) ×2 IMPLANT
SUT VICRYL 4-0 PS2 18IN ABS (SUTURE) IMPLANT
SUTURE E-PAK OPEN HEART (SUTURE) ×3 IMPLANT
SYR 10ML KIT SKIN ADHESIVE (MISCELLANEOUS) IMPLANT
SYSTEM SAHARA CHEST DRAIN ATS (WOUND CARE) ×3 IMPLANT
TAPE CLOTH SURG 4X10 WHT LF (GAUZE/BANDAGES/DRESSINGS) ×4 IMPLANT
TOWEL OR 17X24 6PK STRL BLUE (TOWEL DISPOSABLE) ×6 IMPLANT
TOWEL OR 17X26 10 PK STRL BLUE (TOWEL DISPOSABLE) ×6 IMPLANT
TRAY CATH LUMEN 1 20CM STRL (SET/KITS/TRAYS/PACK) ×2 IMPLANT
TRAY FOLEY IC TEMP SENS 16FR (CATHETERS) ×3 IMPLANT
TUBING ART PRESS 48 MALE/FEM (TUBING) ×4 IMPLANT
TUBING INSUFFLATION 10FT LAP (TUBING) ×3 IMPLANT
UNDERPAD 30X30 INCONTINENT (UNDERPADS AND DIAPERS) ×3 IMPLANT
VALVE CARP ED PERICARD (Prosthesis & Implant Heart) ×2 IMPLANT
WATER STERILE IRR 1000ML POUR (IV SOLUTION) ×6 IMPLANT

## 2012-01-24 NOTE — Progress Notes (Signed)
  Echocardiogram Echocardiogram Transesophageal has been performed.  Mercy Moore 01/24/2012, 9:11 AM

## 2012-01-24 NOTE — OR Nursing (Signed)
2nd Call to SICU @1745  Notified of Left Femoral A-Line

## 2012-01-24 NOTE — OR Nursing (Signed)
1st call to SICU @1655 

## 2012-01-24 NOTE — Progress Notes (Signed)
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 Staff requested pastoral services be provided to her family.  Family pastor is involved and providing presence and emotional support. I was asked by family to see if I could get an update status on patient.  Spoke with OR and relayed information to family.  Will follow as needed.    01/24/12 1100  Clinical Encounter Type  Visited With Family  Visit Type Spiritual support  Referral From Other (Comment)  Consult/Referral To (Staff member )  Spiritual Encounters  Spiritual Needs Emotional  Stress Factors  Family Stress Factors None identified

## 2012-01-24 NOTE — Brief Op Note (Signed)
                   [address]            [place] [address]          [phone]    01/24/2012  3:06 PM  PATIENT:  Douglas [name].  78 y.o. male  PRE-OPERATIVE DIAGNOSIS:  AS,CAD,ASCENDING ANEURYSM  POST-OPERATIVE DIAGNOSIS: SAME  PROCEDURE:  Procedure(s):R+G ASCENDING AORTIC ANEURYSM WITH #25 PERIMOUNT AVR/#28 DACRON GRAFT ROOT REPLACEMENT WITH BENTALL PROCEDURE CORONARY ARTERY BYPASS GRAFTING (CABG)X3 (LIMA-LAD; SVD-OM; SVG-PD)  SURGEON:  Surgeon(s): Kerin Perna, MD  PHYSICIAN ASSISTANT: Avalie Oconnor PA-C, ERIN BARRETT PA-C  ANESTHESIA:   general  PATIENT CONDITION:  ICU - intubated and hemodynamically stable.  PRE-OPERATIVE WEIGHT: 108 kg  COMPLICATIONS- NO KNOWN

## 2012-01-24 NOTE — Anesthesia Preprocedure Evaluation (Addendum)
Anesthesia Evaluation  Patient identified by MRN, date of birth, ID band Patient awake    Reviewed: Allergy & Precautions, H&P , NPO status , Patient's Chart, lab work & pertinent test results, reviewed documented beta blocker date and time   History of Anesthesia Complications (+) DIFFICULT AIRWAY  Airway Mallampati: III TM Distance: >3 FB Neck ROM: Full    Dental  (+) Teeth Intact   Pulmonary  breath sounds clear to auscultation        Cardiovascular hypertension, Pt. on medications and Pt. on home beta blockers + CAD Rhythm:Regular Rate:Normal     Neuro/Psych    GI/Hepatic hiatal hernia,   Endo/Other    Renal/GU      Musculoskeletal   Abdominal (+) + obese,  Abdomen: soft.    Peds  Hematology   Anesthesia Other Findings   Reproductive/Obstetrics                          Anesthesia Physical Anesthesia Plan  ASA: III  Anesthesia Plan: General   Post-op Pain Management:    Induction: Intravenous  Airway Management Planned: Oral ETT and Video Laryngoscope Planned  Additional Equipment: Arterial line, CVP, PA Cath, TEE, 3D TEE and Ultrasound Guidance Line Placement  Intra-op Plan:   Post-operative Plan: Post-operative intubation/ventilation  Informed Consent: I have reviewed the patients History and Physical, chart, labs and discussed the procedure including the risks, benefits and alternatives for the proposed anesthesia with the patient or authorized representative who has indicated his/her understanding and acceptance.   Dental advisory given  Plan Discussed with: CRNA and Anesthesiologist  Anesthesia Plan Comments: (Ascending aortic aneurysm 6 cm Aortic stenosis AVA 0.95 2V CAD Esophageal dysmotility Htn Obesity  Plan GA   Kipp Brood, MD)      Anesthesia Quick Evaluation

## 2012-01-24 NOTE — Progress Notes (Signed)
Pt arrived from OR on monitor, remained stable through admission, family to bedside as per norms,  MD to bedside, discussed ct output, orders received, reported to night shift RN.

## 2012-01-24 NOTE — Preoperative (Signed)
Beta Blockers   Reason not to administer Beta Blockers:Metoprolol given at 0400 on 01/24/2012

## 2012-01-24 NOTE — Progress Notes (Signed)
SICU p.m. Rounds  Status post Bio.-Bentall for 6.2 cm ascending aneurysm and moderate AS with combined CABG x3  Chest tube output 150-200 last hour urine output excellent Blood pressure 120 on low-dose domain cardiac index 1.7

## 2012-01-24 NOTE — Anesthesia Procedure Notes (Signed)
Performed by: Latham Kinzler R       

## 2012-01-24 NOTE — Progress Notes (Signed)
The patient was examined and preop studies reviewed. There has been no change from the prior exam and the patient is ready for surgery.  PLAN Bio-Bentall,CABG

## 2012-01-24 NOTE — Anesthesia Postprocedure Evaluation (Signed)
  Anesthesia Post-op Note  Patient: Douglas [name].  Procedure(s) Performed: Procedure(s) (LRB): BENTALL PROCEDURE (N/A) CORONARY ARTERY BYPASS GRAFTING (CABG) (N/A)  Patient Location: SICU  Anesthesia Type: General  Level of Consciousness: unresponsive  Airway and Oxygen Therapy: Patient remains intubated per anesthesia plan and Patient placed on Ventilator (see vital sign flow sheet for setting)  Post-op Pain: none  Post-op Assessment: Post-op Vital signs reviewed, Patient's Cardiovascular Status Stable and Respiratory Function Stable  Post-op Vital Signs: Reviewed  Complications: No apparent anesthesia complications

## 2012-01-24 NOTE — Transfer of Care (Signed)
Immediate Anesthesia Transfer of Care Note  Patient: Douglas [name].  Procedure(s) Performed: Procedure(s) (LRB): BENTALL PROCEDURE (N/A)2 CORONARY ARTERY BYPASS GRAFTING (CABG) (N/A)  Patient Location: SICU  Anesthesia Type: General  Level of Consciousness: unresponsive  Airway & Oxygen Therapy: Patient remains intubated per anesthesia plan and Patient placed on Ventilator (see vital sign flow sheet for setting)  Post-op Assessment: Report given to PACU RN and Post -op Vital signs reviewed and stable  Post vital signs: Reviewed and stable  Complications: No apparent anesthesia complications

## 2012-01-25 ENCOUNTER — Inpatient Hospital Stay (HOSPITAL_COMMUNITY): Payer: Medicare Other

## 2012-01-25 ENCOUNTER — Encounter (HOSPITAL_COMMUNITY): Payer: Self-pay | Admitting: Cardiothoracic Surgery

## 2012-01-25 LAB — PREPARE FRESH FROZEN PLASMA
Unit division: 0
Unit division: 0
Unit division: 0

## 2012-01-25 LAB — POCT I-STAT, CHEM 8
BUN: 15 mg/dL (ref 6–23)
Calcium, Ion: 1.22 mmol/L (ref 1.12–1.32)
Chloride: 108 mEq/L (ref 96–112)
Creatinine, Ser: 1 mg/dL (ref 0.50–1.35)
Glucose, Bld: 163 mg/dL — ABNORMAL HIGH (ref 70–99)
HCT: 24 % — ABNORMAL LOW (ref 39.0–52.0)
Hemoglobin: 8.2 g/dL — ABNORMAL LOW (ref 13.0–17.0)
Potassium: 4.3 mEq/L (ref 3.5–5.1)
Sodium: 142 mEq/L (ref 135–145)
TCO2: 23 mmol/L (ref 0–100)

## 2012-01-25 LAB — CBC
HCT: 24.6 % — ABNORMAL LOW (ref 39.0–52.0)
MCHC: 35.2 g/dL (ref 30.0–36.0)
MCV: 88.8 fL (ref 78.0–100.0)
Platelets: 102 10*3/uL — ABNORMAL LOW (ref 150–400)
RDW: 13.7 % (ref 11.5–15.5)
RDW: 14.3 % (ref 11.5–15.5)
WBC: 10.7 10*3/uL — ABNORMAL HIGH (ref 4.0–10.5)
WBC: 18.6 10*3/uL — ABNORMAL HIGH (ref 4.0–10.5)

## 2012-01-25 LAB — POCT I-STAT 3, ART BLOOD GAS (G3+)
Acid-base deficit: 2 mmol/L (ref 0.0–2.0)
Acid-base deficit: 3 mmol/L — ABNORMAL HIGH (ref 0.0–2.0)
Bicarbonate: 21.8 mEq/L (ref 20.0–24.0)
Bicarbonate: 22.1 mEq/L (ref 20.0–24.0)
O2 Saturation: 98 %
O2 Saturation: 92 %
Patient temperature: 36.6
Patient temperature: 98
TCO2: 23 mmol/L (ref 0–100)
TCO2: 23 mmol/L (ref 0–100)
pCO2 arterial: 31.7 mmHg — ABNORMAL LOW (ref 35.0–45.0)
pCO2 arterial: 39.5 mmHg (ref 35.0–45.0)
pH, Arterial: 7.444 (ref 7.350–7.450)
pH, Arterial: 7.354 (ref 7.350–7.450)
pO2, Arterial: 101 mmHg — ABNORMAL HIGH (ref 80.0–100.0)
pO2, Arterial: 66 mmHg — ABNORMAL LOW (ref 80.0–100.0)

## 2012-01-25 LAB — GLUCOSE, CAPILLARY
Glucose-Capillary: 122 mg/dL — ABNORMAL HIGH (ref 70–99)
Glucose-Capillary: 123 mg/dL — ABNORMAL HIGH (ref 70–99)
Glucose-Capillary: 85 mg/dL (ref 70–99)
Glucose-Capillary: 103 mg/dL — ABNORMAL HIGH (ref 70–99)
Glucose-Capillary: 103 mg/dL — ABNORMAL HIGH (ref 70–99)
Glucose-Capillary: 106 mg/dL — ABNORMAL HIGH (ref 70–99)
Glucose-Capillary: 111 mg/dL — ABNORMAL HIGH (ref 70–99)
Glucose-Capillary: 112 mg/dL — ABNORMAL HIGH (ref 70–99)
Glucose-Capillary: 114 mg/dL — ABNORMAL HIGH (ref 70–99)
Glucose-Capillary: 120 mg/dL — ABNORMAL HIGH (ref 70–99)
Glucose-Capillary: 79 mg/dL (ref 70–99)
Glucose-Capillary: 110 mg/dL — ABNORMAL HIGH (ref 70–99)
Glucose-Capillary: 110 mg/dL — ABNORMAL HIGH (ref 70–99)
Glucose-Capillary: 120 mg/dL — ABNORMAL HIGH (ref 70–99)
Glucose-Capillary: 172 mg/dL — ABNORMAL HIGH (ref 70–99)

## 2012-01-25 LAB — PREPARE PLATELET PHERESIS
Unit division: 0
Unit division: 0

## 2012-01-25 LAB — BASIC METABOLIC PANEL
Chloride: 110 mEq/L (ref 96–112)
Creatinine, Ser: 0.74 mg/dL (ref 0.50–1.35)
GFR calc Af Amer: >90 mL/min (ref >90)
GFR calc non Af Amer: 84 mL/min — ABNORMAL LOW (ref >90)
Potassium: 3.8 mEq/L (ref 3.5–5.1)

## 2012-01-25 LAB — CREATININE, SERUM
Creatinine, Ser: 0.86 mg/dL (ref 0.50–1.35)
GFR calc Af Amer: >90 mL/min (ref >90)
GFR calc non Af Amer: 79 mL/min — ABNORMAL LOW (ref >90)

## 2012-01-25 LAB — MAGNESIUM
Magnesium: 2.4 mg/dL (ref 1.5–2.5)
Magnesium: 2.1 mg/dL (ref 1.5–2.5)

## 2012-01-25 MED ORDER — INSULIN ASPART 100 UNIT/ML ~~LOC~~ SOLN
0.0000 [IU] | SUBCUTANEOUS | Status: AC
  Administered 2012-01-25: 2 [IU] via SUBCUTANEOUS

## 2012-01-25 MED ORDER — INSULIN ASPART 100 UNIT/ML ~~LOC~~ SOLN: 0.0000 [IU] | SUBCUTANEOUS | Status: DC

## 2012-01-25 MED ORDER — INSULIN ASPART 100 UNIT/ML ~~LOC~~ SOLN
0.0000 [IU] | SUBCUTANEOUS | Status: DC
  Administered 2012-01-25: 4 [IU] via SUBCUTANEOUS
  Administered 2012-01-25 – 2012-01-26 (×2): 2 [IU] via SUBCUTANEOUS

## 2012-01-25 NOTE — Op Note (Signed)
NAME:  Douglas [name], Douglas [name] NO.:  [id number]  MEDICAL RECORD NO.:  [id number]  LOCATION:  2307                         FACILITY:  [facility]  PHYSICIAN:  Kerin Perna, M.D.  DATE OF BIRTH:  05/11/1928  DATE OF PROCEDURE:  01/24/2012 DATE OF DISCHARGE:                              OPERATIVE REPORT   OPERATION: 1. Bentall procedure - biologic valve - conduit, aortic root     replacement for an ascending aortic aneurysm using a 23-mm Edwards     pericardial valve, reimplantation of the coronary arteries, and     replacement of the ascending aorta using a 28-mm Gelweave Valsalva     graft. 2. Coronary artery bypass grafting x3 (left internal mammary artery to     left anterior descending artery, saphenous vein graft to obtuse     marginal, saphenous vein graft to posterior descending). 3. Endoscopic harvest of right leg greater saphenous vein. 4. Placement of left femoral A-line.  PREOPERATIVE DIAGNOSES: 1. Enlarging ascending thoracic aneurysm measuring 6.2 cm. 2. Three-vessel coronary artery disease. 3. Moderate aortic stenosis, calcified bicuspid valve.  PREOPERATIVE DIAGNOSES: 1. Enlarging ascending thoracic aneurysm measuring 6.2 cm. 2. Three-vessel coronary artery disease. 3. Moderate aortic stenosis, calcified bicuspid valve.  SURGEON:  Kerin Perna, MD  ASSISTANT:  Rowe Clack, PA-C  ANESTHESIA:  General by Dr. Laverle Hobby.  INDICATIONS:  The patient is an 78 year old gentleman who has had some chest discomfort and a CT scan 6 months ago showed a 5.5-cm ascending aneurysm.  A routine followup scan in 6 months showed this to be increased to 6.2 cm.  The patient's echo showed a bicuspid calcified aortic valve with moderate aortic stenosis.  Cardiac catheterization by Dr. Riley Kill demonstrated significant three-vessel disease.  Because of the increase in size of the aneurysm over a short period of time, it was felt he was at high risk for dissection  and a Bentall procedure with combined CABG was recommended.  I discussed this procedure with the patient and family at length in the office and they understood the benefits, risks, alternatives and agreed to proceed with surgery.  They understood at age 75, he would be at increased risk for stroke, bleeding, and death with overall possible major morbidity and mortality of 8%.  PROCEDURE:  The patient was brought to the operating room and placed supine on the operating table where general anesthesia was induced.  A transesophageal echo probe was placed, which confirmed the preoperative diagnosis.  LV function was fairly well preserved.  A sternal incision was made after a left femoral A-line was placed for blood pressure monitoring.  Saphenous vein was harvested endoscopically from the right leg.  The left internal mammary artery was harvested as a pedicle graft from its origin at the subclavian vessels.  The sternal retractor was placed.  The pericardium was opened.  The ascending aorta was large with maximal diameter just above the sino-tubular junction.  It tapered down to approximately 4.8-5 cm at the innominate artery.  The innominate artery and right atrium were cannulated through pursestrings.  After heparin was administered, an ACT was documented as being therapeutic. The patient was placed on  cardiopulmonary bypass and an LV vent was placed via the right superior pulmonary vein.  The coronary arteries were dissected for grafting and cardioplegia cannula was replaced for both antegrade and retrograde cold blood cardioplegia.  The patient was cooled to 32 degrees.  Aortic clamp was placed carefully just beneath the innominate artery.  Cardioplegia was delivered through both the antegrade and retrograde catheters and there was a good cardioplegic arrest and septal temperature dropped less than 12 degrees. Cardioplegia was delivered every 20 minutes while the crossclamp was  in place.  The distal coronary anastomoses were performed first.  The first distal anastomosis was the posterior descending.  This was a 1.5-mm vessel with proximal heavy calcified 60-70% stenosis, and a reverse saphenous vein was sewn end-to-side with running 7-0 Prolene with good flow through the graft.  The second distal anastomosis was the obtuse marginal branch of the left coronary artery.  This was a 1.5-mm vessel with proximal 90% stenosis.  A reverse saphenous vein was sewn end-to-side with running 7- 0 Prolene with good flow through the graft.  Cardioplegia was redosed. The third distal anastomosis was the distal third of the LAD.  It was a 1.5-mm vessel and had a proximal 80-90% stenosis.  The left IMA pedicle was brought through an opening created in the left lateral pericardium and was brought down onto the LAD and sewn end-to-side with running 8-0 Prolene.  There was good flow through the anastomosis after briefly releasing the pedicle bulldog on the mammary artery.  The bulldog was reapplied and the pedicle was secured to the epicardium.  Cardioplegia was redosed.  Attention was then directed to the ascending aneurysm.  The ascending aneurysm was resected from just beneath the crossclamp down to the sino- tubular junction.  It was send out and very large.  The valve was inspected and was found to be heavy calcified and bicuspid.  The valve was excised and the outflow tract irrigated with copious amounts of cold saline.  The coronary sinuses were trimmed down to the anulus and the coronary buttons were developed around the left main and right coronary ostia.  These were retracted away with silk sutures.  The anulus was sized to a 23-mm pericardial valve.  Subannular 2-0 pledgeted sutures were placed around the anulus numbering 18 total.  The biologic valve - conduit was then created using a 28-mm Valsalva graft and a 23-mm pericardial Perimount valve.  Running 4-0 Prolene  was used to connect the valve inside the graft leaving the lower skirt for implantation around the anulus.  The sutures in the anulus were then placed around the base of the valve conduit and the valve conduit was seated and all the sutures were tied.  Extra sutures were placed around the posterior anulus where the tissue was somewhat weak.  This was then reinforced with some biologic adhesive (SurgiFlo).  Next, the two coronary buttons were sewn to the valve conduit at the appropriate sites.  Running 5-0 Prolene was used first with left main coronary button and then for the right coronary button.  They were tightened inside the graft and were widely patent.  There was no kinking.  Next, the distal aspect of the graft was trimmed at the appropriate level and length for anastomosis to the ascending aorta just beneath the crossclamp.  This was constructed using a running 4-0 Prolene with interrupted reinforcing pledgeted 4-0 Prolene sutures along the inside of the anastomosis inferiorly and around the outside anastomosis superiorly.  When this  was completed, two openings in the graft were created using the handheld cautery for the vein anastomoses and two coronary proximal anastomoses with running 6-0 Prolene were connected to the veins.  Air was vented from the coronary arteries on the left side of the heart with a dose of retrograde warm blood cardioplegia and the crossclamp was removed.  The patient was rewarmed and reperfused.  The coronary anastomosis, button anastomosis, and the distal aortic anastomosis were checked and found to be hemostatic after additional sutures were placed in the right coronary button and in the distal graft to aortic anastomosis.  The vents were removed and temporary pacing wires were applied.  Low-dose dopamine was started.  The patient was then ventilated and weaned from bypass without difficulty.  Cardiac output and blood pressure were stable.  The echo  showed good LV function with normal-functioning aortic valve.  Protamine was administered without adverse reaction.  The cannulas were removed.  The platelet count was decreased at 120,000 and platelets and FFP were administered with improved coagulation function. The superior pericardial fat was closed over the aorta.  An anterior mediastinal and left pleural chest tube were placed and brought out through separate incisions.  The sternum was closed with wire.  The pectoralis fascia was closed with running #1 Vicryl.  The subcutaneous and skin layers were closed with running Vicryl and sterile dressings were applied.  Total cardiopulmonary bypass time was 300 minutes.     Kerin Perna, M.D.     PV/MEDQ  D:  01/24/2012  T:  01/25/2012  Job:  [id number]  cc:   Jonelle Sidle, MD

## 2012-01-25 NOTE — Progress Notes (Signed)
UR Completed. [name], Douglas Bibian F [phone]  

## 2012-01-25 NOTE — Addendum Note (Signed)
Addendum  created 01/25/12 1728 by Maquita Sandoval, MD   Modules edited:Notes Section    

## 2012-01-25 NOTE — Op Note (Signed)
Intraoperative Transesophageal Echocardiography   [name] is an 78 year old male with a history of an  ascending aortic aneurysm and aortic stenosis who scheduled to undergo Bentall procedure by Dr. Donata Clay. Intraoperative transesophageal echocardiography was requested to evaluate the aortic valve and the ascending aorta, to determine if any other valvular pathology was present, and to assess the right left ventricular function.  The patient was brought to the operating room and [facility] and general anesthesia was induced without difficulty. Following tracheal intubation and orogastric suctioning the transesophageal echocardiography probe was inserted into the esophagus without difficulty.  Impression pre-bypass findings:  1. Aortic Valve: The aortic valve was bileaflet with an apparent raphae between the left coronary cusp and the noncoronary cusp. There was restriction to opening. Continuous-wave Doppler interrogation of the aortic outflow revealed a peak velocity of 2.9 m/s with a peak gradient of 33 mmHg and a mean gradient of 15 mm of mercury. The aortic annulus measured 2.4 cm in diameter.Aortic valve area using the V-max method was 0.81 cm and 1.2 cm using the VTI method. There was no aortic insufficiency.  2. Ascending aorta: The Ascending aorta was markedly dilated and measured 5.9 cm approximately 4 cm distal to the aortic valve. There was no dissection noted and there was no atheromatous disease noted in the ascending aorta. The aortic root was effaced and dilated.  3. Mitral valve the mitral leaflets coapted well and there was trace mitral insufficiency there was no prolapse or flail segments noted. There was no significant mitral annular calcification noted.  4. Left ventricle: there was normal appearing left ventricular systolic function. The ejection fraction was estimated at 55-60% with vigorous contractility in all segments interrogated. Left ventricular  end-diastolic diameter measured 4.3 cm and left ventricular wall thickness measured 1.05-1.1 cm at end diastole the mid-papillary level. There was no thrombus noted in the left ventricular apex.  5. Right ventricle: There was normal appearing right ventricular function. The right ventricle was normal in size with vigorous contractility of the right ventricular free wall.  6. Tricuspid valve: The tricuspid valve appeared structurally normal there was trace tricuspid insufficiency.  7. Intra-atrial septum: The interatrial septum was intact without evidence of patent foramen ovale or atrial septal defect by color Doppler or bubble study.  8. Left atrium.:The left atrial appeared normal in size and there was no thrombus noted in the left atrium or left atrial appendage.  Post-bypass findings:  1. Aortic valve there was a bioprosthetic valve in the aortic position. The leaflets opened normally there was no aortic insufficiency and the mean transaortic gradient was 5 mm of mercury.  2. Descending aorta: There was a prosthetic graft replacing the proximal ascending aorta and no anastomotic leak could be appreciated.  3. Mitral valve: The mitral leaflets coapted normally with trace mitral insufficiency which was unchanged from the pre-bypass study  4. Left ventricle: There was vigorous contractility in all segments interrogated ejection fraction was estimated 60%.  5. Right ventricle: There was normal appearing right ventricular function which was unchanged from the pre-bypass study.  6. Tricuspid valve: There was trace tricuspid insufficiency which is unchanged from the pre-bypass study.  Kipp Brood M.D.

## 2012-01-25 NOTE — Procedures (Signed)
Extubation Procedure Note  Patient Details:   Name: Douglas [name]. DOB: 09-30-1928 MRN: [id number]   Airway Documentation:  Pt extubated to 4L [place] at 0920. No stridor noted. BBS equal and dim. Positive cuff leak, NIF -22, VC 900. Pt able to vocalize.  Evaluation  O2 sats: stable throughout and currently acceptable Complications: No apparent complications Patient did tolerate procedure well. Bilateral Breath Sounds: Diminished Suctioning: Airway   Christie Beckers 01/25/2012, 9:25 AM

## 2012-01-25 NOTE — Progress Notes (Signed)
1 Day Post-Op Procedure(s) (LRB): BENTALL PROCEDURE (N/A) CORONARY ARTERY BYPASS GRAFTING (CABG) (N/A) Subjective: S/P CABG,Bio-Bentall and replacement of 6.2 cm ascending aneurysm Objective: Vital signs in last 24 hours: Temp:  [97.9 F (36.6 C)-98.6 F (37 C)] 98.4 F (36.9 C) (04/05 0730) Pulse Rate:  [86-89] 89  (04/05 0730) Cardiac Rhythm:  [-] A-V Sequential paced (04/05 0530) Resp:  [0-20] 12  (04/05 0730) BP: (80-130)/(50-67) 105/50 mmHg (04/05 0730) SpO2:  [92 %-100 %] 100 % (04/05 0730) Arterial Line BP: (89-145)/(41-77) 129/67 mmHg (04/05 0100) FiO2 (%):  [49.5 %-99.8 %] 50 % (04/05 0730) Weight:  [253 lb 1.4 oz (114.8 kg)] 253 lb 1.4 oz (114.8 kg) (04/05 0700)  Hemodynamic parameters for last 24 hours: PAP: (28-42)/(15-26) 31/17 mmHg CO:  [1.8 L/min-4.8 L/min] 4.8 L/min CI:  [1.5 L/min/m2-2.2 L/min/m2] 2.2 L/min/m2  Intake/Output from previous day: 04/04 0701 - 04/05 0700 In: 8988.8 [I.V.:4575.8; BJYNW:2956; IV Piggyback:1650] Out: 6120 [Urine:3130; Blood:2000; Chest Tube:990] Intake/Output this shift:    EXAM Responsive on vent A-V pacing , renal Dopa CXR clear  Lab Results:  Basename 01/25/12 0340 01/24/12 2221  WBC 10.7* 16.8*  HGB 8.9* 7.5*8.6*  HCT 25.3* 22.0*24.4*  PLT 102* 131*   BMET:  Basename 01/25/12 0340 01/24/12 2221 01/22/12 1014  NA 141 141 --  K 3.8 3.8 --  CL 110 110 --  CO2 22 -- 21  GLUCOSE 100* 110* --  BUN 11 11 --  CREATININE 0.74 0.900.77 --  CALCIUM 8.5 -- 9.1    PT/INR:  Basename 01/24/12 1815  LABPROT 19.2*  INR 1.58*   ABG    Component Value Date/Time   PHART 7.444 01/25/2012 0351   HCO3 21.8 01/25/2012 0351   TCO2 23 01/25/2012 0351   ACIDBASEDEF 2.0 01/25/2012 0351   O2SAT 98.0 01/25/2012 0351   CBG (last 3)   Basename 01/25/12 0747 01/25/12 0659 01/25/12 0606  GLUCAP 110* 120* 79    Assessment/Plan: S/P Procedure(s) (LRB): BENTALL PROCEDURE (N/A) CORONARY ARTERY BYPASS GRAFTING (CABG) (N/A) See  progression orders Diuresis as BP tolerates  LOS: 1 day    VAN TRIGT III,Douglas [name] 01/25/2012

## 2012-01-25 NOTE — Addendum Note (Signed)
Addendum  created 01/25/12 1630 by Kipp Brood, MD   Modules edited:Notes Section

## 2012-01-25 NOTE — Progress Notes (Signed)
Patient ID: Douglas Babinski., male   DOB: 1928/04/28, 78 y.o.   MRN: [id number] POD # 1 Aortic root replacement, CABG x 3  Up in chair  BP 107/57  Pulse 89  Temp(Src) 99.3 F (37.4 C) (Oral)  Resp 27  Wt 253 lb 1.4 oz (114.8 kg)  SpO2 94%   Intake/Output Summary (Last 24 hours) at 01/25/12 1753 Last data filed at 01/25/12 1600  Gross per 24 hour  Intake 5644.2 ml  Output   3220 ml  Net 2424.2 ml   On dopamine @ 3 mcg/kg/min  Diurese

## 2012-01-25 NOTE — Progress Notes (Signed)
Anesthesiology followup:  Douglas [name] is alert and is  neurologically intact. He has mild incisional pain and is hemodynamically stable. He was extubated this morning and at 0920.  Vital signs: Temperature 37.1 heart rate 90 (DDD paced) Respiratory rate 25 blood pressure 125/53 O2 sat 99% and on 4 L. Hematocrit 25.3 hemoglobin 8.9 platelet count 102,000 potassium 3.8 creatinine 0.90 glucose 85  One day status post Bentall procedure for bicuspid aortic valve and ascending aortic aneurysm stable postoperative course.

## 2012-01-26 ENCOUNTER — Inpatient Hospital Stay (HOSPITAL_COMMUNITY): Payer: Medicare Other

## 2012-01-26 LAB — GLUCOSE, CAPILLARY
Glucose-Capillary: 112 mg/dL — ABNORMAL HIGH (ref 70–99)
Glucose-Capillary: 123 mg/dL — ABNORMAL HIGH (ref 70–99)
Glucose-Capillary: 143 mg/dL — ABNORMAL HIGH (ref 70–99)
Glucose-Capillary: 149 mg/dL — ABNORMAL HIGH (ref 70–99)
Glucose-Capillary: 164 mg/dL — ABNORMAL HIGH (ref 70–99)

## 2012-01-26 LAB — CBC
HCT: 24.3 % — ABNORMAL LOW (ref 39.0–52.0)
Hemoglobin: 8.4 g/dL — ABNORMAL LOW (ref 13.0–17.0)
MCH: 31.3 pg (ref 26.0–34.0)
MCHC: 34.6 g/dL (ref 30.0–36.0)
MCV: 90.7 fL (ref 78.0–100.0)
Platelets: 103 10*3/uL — ABNORMAL LOW (ref 150–400)
RBC: 2.68 MIL/uL — ABNORMAL LOW (ref 4.22–5.81)
RDW: 14.2 % (ref 11.5–15.5)
WBC: 19.2 10*3/uL — ABNORMAL HIGH (ref 4.0–10.5)

## 2012-01-26 LAB — BASIC METABOLIC PANEL
BUN: 20 mg/dL (ref 6–23)
CO2: 24 mEq/L (ref 19–32)
Calcium: 8.7 mg/dL (ref 8.4–10.5)
Chloride: 106 mEq/L (ref 96–112)
Creatinine, Ser: 0.99 mg/dL (ref 0.50–1.35)
GFR calc Af Amer: 86 mL/min — ABNORMAL LOW (ref >90)
GFR calc non Af Amer: 74 mL/min — ABNORMAL LOW (ref >90)
Glucose, Bld: 134 mg/dL — ABNORMAL HIGH (ref 70–99)
Potassium: 4.1 mEq/L (ref 3.5–5.1)
Sodium: 139 mEq/L (ref 135–145)

## 2012-01-26 MED ORDER — TRAMADOL HCL 50 MG PO TABS
50.0000 mg | ORAL_TABLET | Freq: Four times a day (QID) | ORAL | Status: DC | PRN
  Administered 2012-01-26 – 2012-01-27 (×4): 50 mg via ORAL
  Filled 2012-01-26 (×4): qty 1

## 2012-01-26 MED ORDER — INSULIN ASPART 100 UNIT/ML ~~LOC~~ SOLN
0.0000 [IU] | Freq: Three times a day (TID) | SUBCUTANEOUS | Status: DC
  Administered 2012-01-26: 3 [IU] via SUBCUTANEOUS
  Administered 2012-01-26: 2 [IU] via SUBCUTANEOUS
  Administered 2012-01-27: 3 [IU] via SUBCUTANEOUS
  Administered 2012-01-28: 2 [IU] via SUBCUTANEOUS

## 2012-01-26 MED ORDER — FUROSEMIDE 10 MG/ML IJ SOLN
20.0000 mg | Freq: Two times a day (BID) | INTRAMUSCULAR | Status: DC
  Administered 2012-01-26 – 2012-01-28 (×5): 20 mg via INTRAVENOUS
  Filled 2012-01-26 (×7): qty 2

## 2012-01-26 NOTE — Progress Notes (Signed)
Patient ID: Douglas [name]., male   DOB: 1928-05-13, 78 y.o.   MRN: [id number] Stable day Ambulated BP 98/45  Pulse 79  Temp(Src) 98.2 F (36.8 C) (Oral)  Resp 22  Wt 247 lb 5.7 oz (112.2 kg)  SpO2 90%  Intake/Output Summary (Last 24 hours) at 01/26/12 1811 Last data filed at 01/26/12 1400  Gross per 24 hour  Intake  945.4 ml  Output   1395 ml  Net -449.6 ml   Continue current care

## 2012-01-26 NOTE — Progress Notes (Signed)
2 Days Post-Op Procedure(s) (LRB): BENTALL PROCEDURE (N/A) CORONARY ARTERY BYPASS GRAFTING (CABG) (N/A) Subjective: Tired at present has been in chair since 5 AM Denies pain  Objective: Vital signs in last 24 hours: Temp:  [98.3 F (36.8 C)-99.4 F (37.4 C)] 98.3 F (36.8 C) (04/06 0720) Pulse Rate:  [88-91] 88  (04/06 0800) Cardiac Rhythm:  [-] A-V Sequential paced (04/06 0800) Resp:  [21-31] 27  (04/06 0800) BP: (83-144)/(46-66) 130/66 mmHg (04/06 0800) SpO2:  [94 %-100 %] 96 % (04/06 0800) FiO2 (%):  [39.8 %] 39.8 % (04/05 0900) Weight:  [247 lb 5.7 oz (112.2 kg)] 247 lb 5.7 oz (112.2 kg) (04/06 0600)  Hemodynamic parameters for last 24 hours:    Intake/Output from previous day: 04/05 0701 - 04/06 0700 In: 1199.9 [P.O.:390; I.V.:659.9; IV Piggyback:150] Out: 1320 [Urine:910; Chest Tube:410] Intake/Output this shift: Total I/O In: 26.1 [I.V.:26.1] Out: -   General appearance: alert and no distress Neurologic: intact Heart: regular rate and rhythm Lungs: diminished breath sounds bibasilar Abdomen: normal findings: soft, non-tender  Lab Results:  Basename 01/26/12 0426 01/25/12 1707 01/25/12 1700  WBC 19.2* -- 18.6*  HGB 8.4* 8.2* --  HCT 24.3* 24.0* --  PLT 103* -- 106*   BMET:  Basename 01/26/12 0426 01/25/12 1707 01/25/12 0340  NA 139 142 --  K 4.1 4.3 --  CL 106 108 --  CO2 24 -- 22  GLUCOSE 134* 163* --  BUN 20 15 --  CREATININE 0.99 1.00 --  CALCIUM 8.7 -- 8.5    PT/INR:  Basename 01/24/12 1815  LABPROT 19.2*  INR 1.58*   ABG    Component Value Date/Time   PHART 7.354 01/25/2012 0915   HCO3 22.1 01/25/2012 0915   TCO2 23 01/25/2012 1707   ACIDBASEDEF 3.0* 01/25/2012 0915   O2SAT 92.0 01/25/2012 0915   CBG (last 3)   Basename 01/26/12 0718 01/26/12 0344 01/26/12 0010  GLUCAP 149* 112* 123*    Assessment/Plan: S/P Procedure(s) (LRB): BENTALL PROCEDURE (N/A) CORONARY ARTERY BYPASS GRAFTING (CABG) (N/A) - CV- still paced, may be CHB with  junctional escape - will check 12 lead  Wean dopamine RESP- pulmonary toilet RENAL- diurese, lytes OK Anemia- stable CBG OK- change to AC/ HS Deconditioning- mobilize   LOS: 2 days    Chrysa Rampy C 01/26/2012

## 2012-01-27 ENCOUNTER — Inpatient Hospital Stay (HOSPITAL_COMMUNITY): Payer: Medicare Other

## 2012-01-27 LAB — GLUCOSE, CAPILLARY
Glucose-Capillary: 111 mg/dL — ABNORMAL HIGH (ref 70–99)
Glucose-Capillary: 113 mg/dL — ABNORMAL HIGH (ref 70–99)
Glucose-Capillary: 119 mg/dL — ABNORMAL HIGH (ref 70–99)
Glucose-Capillary: 158 mg/dL — ABNORMAL HIGH (ref 70–99)

## 2012-01-27 LAB — BASIC METABOLIC PANEL
CO2: 25 mEq/L (ref 19–32)
Calcium: 8.8 mg/dL (ref 8.4–10.5)
Creatinine, Ser: 1.26 mg/dL (ref 0.50–1.35)
Glucose, Bld: 121 mg/dL — ABNORMAL HIGH (ref 70–99)

## 2012-01-27 LAB — CBC
Hemoglobin: 8.5 g/dL — ABNORMAL LOW (ref 13.0–17.0)
MCH: 31 pg (ref 26.0–34.0)
MCV: 91.6 fL (ref 78.0–100.0)
RBC: 2.74 MIL/uL — ABNORMAL LOW (ref 4.22–5.81)

## 2012-01-27 MED ORDER — POTASSIUM CHLORIDE 10 MEQ/50ML IV SOLN
INTRAVENOUS | Status: AC
  Administered 2012-01-27: 10 meq
  Filled 2012-01-27: qty 50

## 2012-01-27 MED ORDER — POTASSIUM CHLORIDE 10 MEQ/50ML IV SOLN
INTRAVENOUS | Status: AC
  Administered 2012-01-27: 10 meq
  Filled 2012-01-27: qty 100

## 2012-01-27 NOTE — Progress Notes (Signed)
Patient ID: Douglas [name]., male   DOB: 08-24-1928, 78 y.o.   MRN: [id number] No complaints this evening Did well with ambulation BP 134/62  Pulse 64  Temp(Src) 99 F (37.2 C) (Oral)  Resp 20  Wt 246 lb 4.1 oz (111.7 kg)  SpO2 100% Continue current care

## 2012-01-27 NOTE — Progress Notes (Signed)
Physical Therapy Evaluation Patient Details Name: Douglas [name]. MRN: [id number] DOB: 06/12/1928 Today's Date: 01/27/2012  Problem List:  Patient Active Problem List  Diagnoses  . Essential hypertension, benign  . Aortic stenosis  . Chest pain  . Aortic dilatation  . Aneurysm of thoracic aorta    Past Medical History:  Past Medical History  Diagnosis Date  . Dysphagia   . Essential hypertension, benign   . Hyperkalemia   . DJD (degenerative joint disease)   . PMR (polymyalgia rheumatica)   . Gout   . Arthritis   . Aortic sclerosis   . Hx of adenomatous colonic polyps   . Aneurysm of thoracic aorta     ascending. 5.7 x 5.5cm  . Difficult intubation     stated difficulty swallowing, and feel like something throat  . Coronary artery disease   . H/O hiatal hernia   . Esophageal dysmotility   . Fibromyalgia     tingling in legs and feet   Past Surgical History:  Past Surgical History  Procedure Date  . Bilateral knee arthroscopy   . Appendectomy   . Hemorroidectomy   . Left inguinal hernia repair   . Hernia repair   . Back surgery     1965 or 1966 lumbar back surgery  . Bentall procedure 01/24/2012    Procedure: BENTALL PROCEDURE;  Surgeon: Kerin Perna, MD;  Location: [facility] OR;  Service: Open Heart Surgery;  Laterality: N/A;  . Coronary artery bypass graft 01/24/2012    Procedure: CORONARY ARTERY BYPASS GRAFTING (CABG);  Surgeon: Kerin Perna, MD;  Location: [facility] OR;  Service: Open Heart Surgery;  Laterality: N/A;    PT Assessment/Plan/Recommendation PT Assessment Clinical Impression Statement: Pt is an 78 y/o male admitted s/p CABG along with the below PT problem list.  Pt would benefit from acute PT in conjunction with Cardiac Rehab Phase I to maximize independence and facilitate d/c home with HHPT. PT Recommendation/Assessment: Patient will need skilled PT in the acute care venue PT Problem List: Decreased strength;Decreased activity tolerance;Decreased  balance;Decreased mobility;Decreased knowledge of use of DME;Decreased knowledge of precautions Barriers to Discharge: None PT Therapy Diagnosis : Difficulty walking;Generalized weakness PT Plan PT Frequency: Min 3X/week PT Treatment/Interventions: DME instruction;Gait training;Stair training;Functional mobility training;Therapeutic activities;Balance training;Patient/family education PT Recommendation Follow Up Recommendations: Home health PT Equipment Recommended: Rolling walker with 5" wheels PT Goals  Acute Rehab PT Goals PT Goal Formulation: With patient Time For Goal Achievement: 7 days Pt will go Supine/Side to Sit: with modified independence PT Goal: Supine/Side to Sit - Progress: Goal set today Pt will go Sit to Supine/Side: with modified independence PT Goal: Sit to Supine/Side - Progress: Goal set today Pt will go Sit to Stand: with modified independence PT Goal: Sit to Stand - Progress: Goal set today Pt will go Stand to Sit: with modified independence PT Goal: Stand to Sit - Progress: Goal set today Pt will Ambulate: >150 feet;with modified independence;with least restrictive assistive device PT Goal: Ambulate - Progress: Goal set today Pt will Go Up / Down Stairs: 3-5 stairs;with min assist;with least restrictive assistive device PT Goal: Up/Down Stairs - Progress: Goal set today  PT Evaluation Precautions/Restrictions  Precautions Precautions: Sternal Required Braces or Orthoses: No Restrictions Weight Bearing Restrictions: Yes RUE Weight Bearing:  (Due to sternal precautions.) LUE Weight Bearing:  (Due to sternal precautions.) Other Position/Activity Restrictions: Pt with external pacer. Prior Functioning  Home Living Lives With: Spouse Receives Help From: Family Type of Home:  House Home Layout: One level Home Access: Stairs to enter Entrance Stairs-Rails: None Entrance Stairs-Number of Steps: 3 Bathroom Shower/Tub: Pension scheme manager: Handicapped height Home Adaptive Equipment: None Prior Function Level of Independence: Independent with basic ADLs;Independent with homemaking with ambulation;Independent with gait;Independent with transfers Able to Take Stairs?: Yes Driving: Yes Vocation: Retired Leisure: Hobbies-yes (Comment) (Still does manual labor such as "cutting wood.") Cognition Cognition Arousal/Alertness: Awake/alert Overall Cognitive Status: Appears within functional limits for tasks assessed Orientation Level: Oriented X4 Sensation/Coordination Sensation Light Touch: Appears Intact Stereognosis: Not tested Hot/Cold: Not tested Proprioception: Not tested Coordination Gross Motor Movements are Fluid and Coordinated: Yes Fine Motor Movements are Fluid and Coordinated: Yes Extremity Assessment RUE Assessment RUE Assessment: Within Functional Limits LUE Assessment LUE Assessment: Within Functional Limits RLE Assessment RLE Assessment: Within Functional Limits LLE Assessment LLE Assessment: Within Functional Limits Pain 0/10 with evaluation. Mobility (including Balance) Bed Mobility Bed Mobility: Yes Supine to Sit: 1: +2 Total assist;Patient percentage (comment);HOB flat ((pt=60%)) Supine to Sit Details (indicate cue type and reason): Assist to translate trunk anterior with cues to limit use of bilateral UEs. Transfers Transfers: Yes Sit to Stand: 1: +2 Total assist;Patient percentage (comment);With upper extremity assist;From bed ((pt=65%)) Sit to Stand Details (indicate cue type and reason): Assist to translate trunk anterior with cues to limit use of bilateral UEs. Stand to Sit: 1: +2 Total assist;Patient percentage (comment);To chair/3-in-1 ((pt=65%)) Stand to Sit Details: Assist to slow descent with cues to limit use of bilateral UEs. Ambulation/Gait Ambulation/Gait: Yes Ambulation/Gait Assistance: 1: +2 Total assist;Patient percentage (comment) ((pt=75%)) Ambulation/Gait Assistance  Details (indicate cue type and reason): Assist for balance with cues for tall posture.  +2 assist in order to control excess equipment. Ambulation Distance (Feet): 120 Feet Assistive device: Rolling walker Gait Pattern: Decreased step length - right;Decreased step length - left;Trunk flexed Stairs: No Wheelchair Mobility Wheelchair Mobility: No  Posture/Postural Control Posture/Postural Control: No significant limitations Balance Balance Assessed: No End of Session PT - End of Session Equipment Utilized During Treatment: Gait belt Activity Tolerance: Patient tolerated treatment well Patient left: in chair;with call bell in reach Nurse Communication: Mobility status for transfers;Mobility status for ambulation General Behavior During Session: [facility] for tasks performed Cognition: [facility] for tasks performed  Cephus Shelling 01/27/2012, 2:51 PM  01/27/2012 Cephus Shelling, PT, DPT [phone]

## 2012-01-27 NOTE — Progress Notes (Signed)
3 Days Post-Op Procedure(s) (LRB): BENTALL PROCEDURE (N/A) CORONARY ARTERY BYPASS GRAFTING (CABG) (N/A) Subjective: No complaints this AM   Objective: Vital signs in last 24 hours: Temp:  [97.9 F (36.6 C)-98.3 F (36.8 C)] 98.3 F (36.8 C) (04/07 0723) Pulse Rate:  [79-116] 79  (04/07 0700) Cardiac Rhythm:  [-] A-V Sequential paced (04/06 2000) Resp:  [10-34] 26  (04/07 0700) BP: (86-145)/(45-79) 112/57 mmHg (04/07 0700) SpO2:  [90 %-100 %] 93 % (04/07 0700) Weight:  [246 lb 4.1 oz (111.7 kg)] 246 lb 4.1 oz (111.7 kg) (04/07 0700)  Hemodynamic parameters for last 24 hours:    Intake/Output from previous day: 04/06 0701 - 04/07 0700 In: 826.1 [P.O.:600; I.V.:126.1; IV Piggyback:100] Out: 1680 [Urine:1580; Chest Tube:100] Intake/Output this shift:    General appearance: alert and no distress Neurologic: intact Heart: regular rate and rhythm Lungs: diminished breath sounds bibasilar Abdomen: normal findings: soft, non-tender  Lab Results:  Basename 01/27/12 0457 01/26/12 0426  WBC 19.0* 19.2*  HGB 8.5* 8.4*  HCT 25.1* 24.3*  PLT 129* 103*   BMET:  Basename 01/27/12 0457 01/26/12 0426  NA 137 139  K 3.7 4.1  CL 102 106  CO2 25 24  GLUCOSE 121* 134*  BUN 34* 20  CREATININE 1.26 0.99  CALCIUM 8.8 8.7    PT/INR:  Basename 01/24/12 1815  LABPROT 19.2*  INR 1.58*   ABG    Component Value Date/Time   PHART 7.354 01/25/2012 0915   HCO3 22.1 01/25/2012 0915   TCO2 23 01/25/2012 1707   ACIDBASEDEF 3.0* 01/25/2012 0915   O2SAT 92.0 01/25/2012 0915   CBG (last 3)   Basename 01/27/12 0725 01/26/12 2358 01/26/12 1546  GLUCAP 113* 119* 164*    Assessment/Plan: S/P Procedure(s) (LRB): BENTALL PROCEDURE (N/A) CORONARY ARTERY BYPASS GRAFTING (CABG) (N/A) - CV- HR 66, ? Junctional, will change pacer to VVI @ 60 and observe RESP - pulmonary toilet RENAL- BUN and Cr up slightly today Endo- CBGs well controlled Deconditioning- continue to mobilize   LOS: 3 days     Douglas [name] C 01/27/2012

## 2012-01-28 ENCOUNTER — Inpatient Hospital Stay (HOSPITAL_COMMUNITY): Payer: Medicare Other

## 2012-01-28 ENCOUNTER — Encounter (HOSPITAL_COMMUNITY): Payer: Self-pay | Admitting: General Practice

## 2012-01-28 LAB — TYPE AND SCREEN
ABO/RH(D): B POS
Antibody Screen: NEGATIVE
Unit division: 0
Unit division: 0
Unit division: 0
Unit division: 0

## 2012-01-28 LAB — GLUCOSE, CAPILLARY
Glucose-Capillary: 144 mg/dL — ABNORMAL HIGH (ref 70–99)
Glucose-Capillary: 118 mg/dL — ABNORMAL HIGH (ref 70–99)
Glucose-Capillary: 137 mg/dL — ABNORMAL HIGH (ref 70–99)
Glucose-Capillary: 117 mg/dL — ABNORMAL HIGH (ref 70–99)
Glucose-Capillary: 92 mg/dL (ref 70–99)

## 2012-01-28 LAB — CBC
HCT: 24.2 % — ABNORMAL LOW (ref 39.0–52.0)
MCH: 30.9 pg (ref 26.0–34.0)
MCHC: 33.9 g/dL (ref 30.0–36.0)
MCV: 91.3 fL (ref 78.0–100.0)
RDW: 14.3 % (ref 11.5–15.5)

## 2012-01-28 LAB — BASIC METABOLIC PANEL WITH GFR
BUN: 48 mg/dL — ABNORMAL HIGH (ref 6–23)
CO2: 24 meq/L (ref 19–32)
Calcium: 8.4 mg/dL (ref 8.4–10.5)
Chloride: 103 meq/L (ref 96–112)
Creatinine, Ser: 1.4 mg/dL — ABNORMAL HIGH (ref 0.50–1.35)
GFR calc Af Amer: 52 mL/min — ABNORMAL LOW
GFR calc non Af Amer: 45 mL/min — ABNORMAL LOW
Glucose, Bld: 120 mg/dL — ABNORMAL HIGH (ref 70–99)
Potassium: 4.1 meq/L (ref 3.5–5.1)
Sodium: 136 meq/L (ref 135–145)

## 2012-01-28 MED ORDER — FUROSEMIDE 40 MG PO TABS
40.0000 mg | ORAL_TABLET | Freq: Every day | ORAL | Status: DC
  Administered 2012-01-28: 40 mg via ORAL
  Filled 2012-01-28 (×4): qty 1

## 2012-01-28 MED ORDER — POLYSACCHARIDE IRON COMPLEX 150 MG PO CAPS
150.0000 mg | ORAL_CAPSULE | Freq: Every day | ORAL | Status: DC
  Administered 2012-01-28 – 2012-02-01 (×5): 150 mg via ORAL
  Filled 2012-01-28 (×6): qty 1

## 2012-01-28 MED ORDER — MOVING RIGHT ALONG BOOK
Freq: Once | Status: AC
  Administered 2012-01-28: 10:00:00
  Filled 2012-01-28: qty 1

## 2012-01-28 MED ORDER — SODIUM CHLORIDE 0.9 % IJ SOLN: 3.0000 mL | INTRAMUSCULAR | Status: DC | PRN

## 2012-01-28 MED ORDER — SODIUM CHLORIDE 0.9 % IJ SOLN
3.0000 mL | Freq: Two times a day (BID) | INTRAMUSCULAR | Status: DC
  Administered 2012-01-29 – 2012-02-03 (×5): 3 mL via INTRAVENOUS

## 2012-01-28 MED ORDER — POTASSIUM CHLORIDE CRYS ER 20 MEQ PO TBCR
20.0000 meq | EXTENDED_RELEASE_TABLET | Freq: Every day | ORAL | Status: DC
  Administered 2012-01-28: 20 meq via ORAL
  Filled 2012-01-28 (×2): qty 1

## 2012-01-28 MED ORDER — POLYETHYLENE GLYCOL 3350 17 G PO PACK
17.0000 g | PACK | Freq: Every day | ORAL | Status: DC
  Administered 2012-01-28: 17 g via ORAL
  Filled 2012-01-28 (×2): qty 1

## 2012-01-28 MED ORDER — SODIUM CHLORIDE 0.9 % IV SOLN: 250.0000 mL | INTRAVENOUS | Status: DC | PRN

## 2012-01-28 MED FILL — Sodium Chloride IV Soln 0.9%: INTRAVENOUS | Qty: 1000 | Status: AC

## 2012-01-28 MED FILL — Sodium Bicarbonate IV Soln 8.4%: INTRAVENOUS | Qty: 50 | Status: AC

## 2012-01-28 MED FILL — Albumin, Human Inj 5%: INTRAVENOUS | Qty: 750 | Status: AC

## 2012-01-28 MED FILL — Calcium Chloride Inj 10%: INTRAVENOUS | Qty: 10 | Status: AC

## 2012-01-28 MED FILL — Mannitol IV Soln 20%: INTRAVENOUS | Qty: 500 | Status: AC

## 2012-01-28 MED FILL — Sodium Chloride Irrigation Soln 0.9%: Qty: 3000 | Status: AC

## 2012-01-28 MED FILL — Electrolyte-R (PH 7.4) Solution: INTRAVENOUS | Qty: 6000 | Status: AC

## 2012-01-28 MED FILL — Heparin Sodium (Porcine) Inj 1000 Unit/ML: INTRAMUSCULAR | Qty: 2 | Status: AC

## 2012-01-28 MED FILL — Lidocaine HCl IV Inj 20 MG/ML: INTRAVENOUS | Qty: 15 | Status: AC

## 2012-01-28 NOTE — Progress Notes (Signed)
   CARE MANAGEMENT NOTE 01/28/2012  Patient:  Douglas [name], Douglas [name]   Account Number:  [id number]  Date Initiated:  01/28/2012  Documentation initiated by:  Altha Sweitzer  Subjective/Objective Assessment:   PT S/P CABG X 4 ON 01/24/12.  PTA, PT INDEPENDENT, LIVES WITH WIFE.     Action/Plan:   MET WITH PT TO DISCUSS DC PLANS.   HE STATES SPOUSE WILL PROVIDE 24HR CARE AT DISCHARGE.  HE HAS RW AT HOME, IF NEEDED.   Anticipated DC Date:  01/31/2012   Anticipated DC Plan:  HOME W HOME HEALTH SERVICES      DC Planning Services  CM consult      Choice offered to / List presented to:             Status of service:  In process, will continue to follow Medicare Important Message given?   (If response is "NO", the following Medicare IM given date fields will be blank) Date Medicare IM given:   Date Additional Medicare IM given:    Discharge Disposition:    Per UR Regulation:    If discussed at Long Length of Stay Meetings, dates discussed:    Comments:  01/28/12 Douglas Reitan,RN,BSN [id number] WILL CONT TO FOLLOW PT FOR HOME NEEDS AS HE PROGRESSES. Phone #[phone]

## 2012-01-28 NOTE — Progress Notes (Signed)
CARDIAC REHAB PHASE I   PRE:  Rate/Rhythm: 68SR BBB  BP:  Supine:   Sitting: 115/67  Standing:    SaO2: 97%RA  MODE:  Ambulation: 150 ft   POST:  Rate/Rhythem: 67SR BBB  BP:  Supine:   Sitting: 98/53  Standing:    SaO2: 95%RA 1350-1418 Pt walked 150 ft on RA with asst x 2.  Several standing rest breaks and one sitdown break. Exhausted by the end of walk. To recliner with call bell. Pt states he has difficulty swallowing and has to cough. Set up lunch and encouraged pt to try soft foods on tray. Used rolling walker to walk. Will keep as asst x 2.  Duanne Limerick

## 2012-01-28 NOTE — Progress Notes (Signed)
Physical Therapy Treatment Patient Details Name: Geordan Xu. MRN: [id number] DOB: [birth year] Today's Date: 01/28/2012  PT Assessment/Plan  PT - Assessment/Plan Comments on Treatment Session: Patient s/p CABG x1.  Patient needed incr assist with supine to sit today to follow sternal precautions.  Patient should progress and go home with HHPT and RW.   PT Plan: Discharge plan remains appropriate;Frequency remains appropriate PT Frequency: Min 3X/week Recommendations for Other Services: OT consult Follow Up Recommendations: Home health PT;Supervision/Assistance - 24 hour Equipment Recommended: Rolling walker with 5" wheels PT Goals  Acute Rehab PT Goals PT Goal: Supine/Side to Sit - Progress: Progressing toward goal PT Goal: Sit to Supine/Side - Progress: Progressing toward goal PT Goal: Sit to Stand - Progress: Progressing toward goal PT Goal: Stand to Sit - Progress: Progressing toward goal PT Goal: Ambulate - Progress: Progressing toward goal  PT Treatment Precautions/Restrictions  Precautions Precautions: Sternal;Fall Required Braces or Orthoses: No Restrictions Weight Bearing Restrictions: Yes RUE Weight Bearing:  (sternal precautions) LUE Weight Bearing:  (sternal precautions) Other Position/Activity Restrictions: External pacer Mobility (including Balance) Bed Mobility Supine to Sit: 1: +2 Total assist;Patient percentage (comment);With rails;HOB flat (pt = 65%) Supine to Sit Details (indicate cue type and reason): continues to need cues for technique to follow sternal precautions; assist to translate trunk anterior with cues to limit use of bil UEs.   Transfers Sit to Stand: 1: +2 Total assist;Patient percentage (comment);From elevated surface;With upper extremity assist;From bed (pt = 65%) Sit to Stand Details (indicate cue type and reason): patient held pillow to decr use of bil UEs. Stand to Sit: 1: +2 Total assist;Patient percentage (comment);With upper extremity  assist;To chair/3-in-1 (pt = 65%) Stand to Sit Details: assist to slow descent with cues to limit use of bil UEs.   Ambulation/Gait Ambulation/Gait Assistance: 4: Min assist Ambulation/Gait Assistance Details (indicate cue type and reason): second person followed with chair for safety Ambulation Distance (Feet): 150 Feet Assistive device: Rolling walker Gait Pattern: Decreased step length - right;Decreased step length - left;Trunk flexed Stairs: No Wheelchair Mobility Wheelchair Mobility: No  Posture/Postural Control Posture/Postural Control: No significant limitations Balance Balance Assessed: No   End of Session PT - End of Session Equipment Utilized During Treatment: Gait belt Activity Tolerance: Patient tolerated treatment well Patient left: in chair;with call bell in reach Nurse Communication: Mobility status for transfers;Mobility status for ambulation General Behavior During Session: [facility] for tasks performed Cognition: [facility] for tasks performed  INGOLD,Tavares Levinson 01/28/2012, 2:09 PM  [facility] Acute Rehabilitation [phone] (pager)

## 2012-01-28 NOTE — Progress Notes (Signed)
4 Days Post-Op Procedure(s) (LRB): BENTALL PROCEDURE (N/A) CORONARY ARTERY BYPASS GRAFTING (CABG) (N/A) Subjective:                     [address]            [place] [address]          [phone]       Objective:  Walking in hall Sinus brady CXR clear    BUN 44- hold lasix today Vital signs in last 24 hours: Temp:  [98 F (36.7 C)-99 F (37.2 C)] 98 F (36.7 C) (04/08 0728) Pulse Rate:  [59-71] 64  (04/08 0700) Cardiac Rhythm:  [-] Junctional rhythm (04/08 0000) Resp:  [17-32] 19  (04/08 0700) BP: (97-152)/(47-74) 111/53 mmHg (04/08 0700) SpO2:  [90 %-100 %] 93 % (04/08 0700) Weight:  [247 lb 5.7 oz (112.2 kg)] 247 lb 5.7 oz (112.2 kg) (04/08 0500)  Hemodynamic parameters for last 24 hours:  stable  Intake/Output from previous day: 04/07 0701 - 04/08 0700 In: 240 [P.O.:240] Out: 915 [Urine:915] Intake/Output this shift:    Lungs clear Neuro intact abd soft  Lab Results:  Basename 01/28/12 0400 01/27/12 0457  WBC 17.1* 19.0*  HGB 8.2* 8.5*  HCT 24.2* 25.1*  PLT 168 129*   BMET:  Basename 01/28/12 0400 01/27/12 0457  NA 136 137  K 4.1 3.7  CL 103 102  CO2 24 25  GLUCOSE 120* 121*  BUN 48* 34*  CREATININE 1.40* 1.26  CALCIUM 8.4 8.8    PT/INR: No results found for this basename: LABPROT,INR in the last 72 hours ABG    Component Value Date/Time   PHART 7.354 01/25/2012 0915   HCO3 22.1 01/25/2012 0915   TCO2 23 01/25/2012 1707   ACIDBASEDEF 3.0* 01/25/2012 0915   O2SAT 92.0 01/25/2012 0915   CBG (last 3)   Basename 01/28/12 0726 01/27/12 1753 01/27/12 1141  GLUCAP 118* 111* 158*    Assessment/Plan: S/P Procedure(s) (LRB): BENTALL PROCEDURE (N/A) CORONARY ARTERY BYPASS GRAFTING (CABG) (N/A) Plan for transfer to step-down: see transfer orders No coumadin Follow BUN Hold lopressor d/t low HR  LOS: 4 days    VAN TRIGT III,Corrado Hymon 01/28/2012

## 2012-01-29 ENCOUNTER — Inpatient Hospital Stay (HOSPITAL_COMMUNITY): Payer: Medicare Other

## 2012-01-29 LAB — URINALYSIS, ROUTINE W REFLEX MICROSCOPIC
Bilirubin Urine: NEGATIVE
Hgb urine dipstick: NEGATIVE
Ketones, ur: NEGATIVE mg/dL
Nitrite: NEGATIVE
Urobilinogen, UA: 1 mg/dL (ref 0.0–1.0)

## 2012-01-29 LAB — CBC
HCT: 27.4 % — ABNORMAL LOW (ref 39.0–52.0)
Hemoglobin: 9.2 g/dL — ABNORMAL LOW (ref 13.0–17.0)
MCH: 31.7 pg (ref 26.0–34.0)
MCHC: 33.6 g/dL (ref 30.0–36.0)
MCV: 94.5 fL (ref 78.0–100.0)
Platelets: 230 10*3/uL (ref 150–400)
RBC: 2.9 MIL/uL — ABNORMAL LOW (ref 4.22–5.81)
RDW: 14.5 % (ref 11.5–15.5)
WBC: 17.6 10*3/uL — ABNORMAL HIGH (ref 4.0–10.5)

## 2012-01-29 LAB — BASIC METABOLIC PANEL
BUN: 54 mg/dL — ABNORMAL HIGH (ref 6–23)
CO2: 24 mEq/L (ref 19–32)
Calcium: 8.9 mg/dL (ref 8.4–10.5)
Chloride: 100 mEq/L (ref 96–112)
Creatinine, Ser: 1.39 mg/dL — ABNORMAL HIGH (ref 0.50–1.35)
GFR calc Af Amer: 53 mL/min — ABNORMAL LOW (ref >90)
GFR calc non Af Amer: 46 mL/min — ABNORMAL LOW (ref >90)
Glucose, Bld: 139 mg/dL — ABNORMAL HIGH (ref 70–99)
Potassium: 4.3 mEq/L (ref 3.5–5.1)
Sodium: 136 mEq/L (ref 135–145)

## 2012-01-29 LAB — GLUCOSE, CAPILLARY: Glucose-Capillary: 104 mg/dL — ABNORMAL HIGH (ref 70–99)

## 2012-01-29 MED ORDER — SODIUM CHLORIDE 0.9 % IJ SOLN
10.0000 mL | INTRAMUSCULAR | Status: DC | PRN
Start: 2012-01-29 — End: 2012-02-06
  Administered 2012-01-30 – 2012-02-06 (×8): 10 mL

## 2012-01-29 MED ORDER — IOHEXOL 300 MG/ML  SOLN
150.0000 mL | Freq: Once | INTRAMUSCULAR | Status: AC | PRN
  Administered 2012-01-29: 40 mL via ORAL

## 2012-01-29 MED ORDER — FUROSEMIDE 40 MG PO TABS: 40.0000 mg | ORAL_TABLET | Freq: Every day | ORAL | Status: DC

## 2012-01-29 MED ORDER — ACETAMINOPHEN 325 MG PO TABS: 650.0000 mg | ORAL_TABLET | Freq: Four times a day (QID) | ORAL | Status: DC | PRN

## 2012-01-29 MED ORDER — TRAMADOL HCL 50 MG PO TABS
50.0000 mg | ORAL_TABLET | Freq: Four times a day (QID) | ORAL | Status: DC | PRN
  Administered 2012-01-30: 50 mg via ORAL
  Filled 2012-01-29: qty 1

## 2012-01-29 MED ORDER — DIPHENHYDRAMINE HCL 25 MG PO CAPS
25.0000 mg | ORAL_CAPSULE | Freq: Every evening | ORAL | Status: DC | PRN
  Administered 2012-01-29 – 2012-02-05 (×6): 25 mg via ORAL
  Filled 2012-01-29 (×6): qty 1

## 2012-01-29 MED ORDER — PHENOL 1.4 % MT LIQD
1.0000 | OROMUCOSAL | Status: DC | PRN
  Filled 2012-01-29: qty 177

## 2012-01-29 MED ORDER — POTASSIUM CHLORIDE CRYS ER 20 MEQ PO TBCR: 20.0000 meq | EXTENDED_RELEASE_TABLET | Freq: Every day | ORAL | Status: DC

## 2012-01-29 MED ORDER — GUAIFENESIN ER 600 MG PO TB12
600.0000 mg | ORAL_TABLET | Freq: Two times a day (BID) | ORAL | Status: DC
  Administered 2012-01-29 – 2012-02-01 (×7): 600 mg via ORAL
  Filled 2012-01-29 (×10): qty 1

## 2012-01-29 NOTE — Progress Notes (Addendum)
5 Days Post-Op Procedure(s) (LRB): BENTALL PROCEDURE (N/A) CORONARY ARTERY BYPASS GRAFTING (CABG) (N/A)  Subjective: Patient not feeling well. Has complaints of cough, constipation, and sore throat.  Objective: Vital signs in last 24 hours: Patient Vitals for the past 24 hrs:  BP Temp Temp src Pulse Resp SpO2 Weight  01/29/12 0428 144/68 mmHg 97.2 F (36.2 C) Oral 60  18  92 % 244 lb 11.4 oz (111 kg)  01/28/12 2123 129/69 mmHg 98.5 F (36.9 C) Oral 64  18  92 % -  01/28/12 1413 - - - - - 95 % -  01/28/12 1357 115/67 mmHg 98.5 F (36.9 C) Oral 68  18  85 % -  01/28/12 1120 - - - - - 96 % -  01/28/12 1012 122/57 mmHg - - 68  22  98 % -  01/28/12 0900 116/48 mmHg - - 66  27  97 % -  01/28/12 0800 105/55 mmHg - - 65  23  98 % -   Pre op weight  108 kg Current Weight  01/29/12 244 lb 11.4 oz (111 kg)      Intake/Output from previous day: 04/08 0701 - 04/09 0700 In: 830 [P.O.:820; IV Piggyback:10] Out: 725 [Urine:725]   Physical Exam:  Cardiovascular: RRR, no murmurs, gallops, or rubs. Pulmonary: Diminished at bases; no rales, wheezes, or rhonchi. Abdomen: Soft, non tender, bowel sounds present. Extremities: Mild bilateral lower extremity edema. Wounds: Clean and dry.  No erythema or signs of infection.  Lab Results: CBC: Basename 01/29/12 0525 01/28/12 0400  WBC 17.6* 17.1*  HGB 9.2* 8.2*  HCT 27.4* 24.2*  PLT 230 168   BMET:  Basename 01/29/12 0525 01/28/12 0400  NA 136 136  K 4.3 4.1  CL 100 103  CO2 24 24  GLUCOSE 139* 120*  BUN 54* 48*  CREATININE 1.39* 1.40*  CALCIUM 8.9 8.4    PT/INR: No results found for this basename: LABPROT,INR in the last 72 hours ABG:  INR: Will add last result for INR, ABG once components are confirmed Will add last 4 CBG results once components are confirmed  Assessment/Plan:  1. CV - SB/Junctional/occ paced. Not on beta blocker. 2.  Pulmonary - Encourage incentive spirometer.CXR this am shows low lung volumes, left base  atelectasis, small b/l pleural effusions. 3. Mild volume overload -May need to hold Lasix. 4.  Acute blood loss anemia - H/H this am 9.2/27.4.Continue Nu Iron. 5.Mild ARI-Creatinine stable at 1.39 (BUN increased) 6.Leukocytosis-WBC remains 17,000. Is afebrile.No signs of wound infection. Will check UA. Possible pulmonary etiology but central line still remains as well. 7.HGA1C pre op 5.7.CBGs have remained 140 or less.Will stop CBGs. 8.Mucinex for cough.  ZIMMERMAN,DONIELLE MPA-C 01/29/2012   patient examined and medical record reviewed,agree with above note Will stop lasix,K+ and follow BUN;remove neck line from day of surgery VAN TRIGT III,Shamona Wirtz 01/29/2012

## 2012-01-29 NOTE — Plan of Care (Signed)
Problem: Phase III Progression Outcomes Goal: Transfer to PCTU/Telemetry POD Outcome: Completed/Met Date Met:  01/29/12 4 days post op

## 2012-01-29 NOTE — Evaluation (Signed)
Clinical/Bedside Swallow Evaluation Patient Details  Name: Douglas [name]. MRN: [id number] DOB: [birth year] Today's Date: 01/29/2012  Past Medical History:  Past Medical History  Diagnosis Date  . Dysphagia   . Essential hypertension, benign   . Hyperkalemia   . DJD (degenerative joint disease)   . PMR (polymyalgia rheumatica)   . Gout   . Arthritis   . Aortic sclerosis   . Hx of adenomatous colonic polyps   . Aneurysm of thoracic aorta     ascending. 5.7 x 5.5cm  . Difficult intubation     stated difficulty swallowing, and feel like something throat  . Coronary artery disease   . H/O hiatal hernia   . Esophageal dysmotility   . Fibromyalgia     tingling in legs and feet   Past Surgical History:  Past Surgical History  Procedure Date  . Bilateral knee arthroscopy   . Appendectomy   . Hemorroidectomy   . Left inguinal hernia repair   . Hernia repair   . Back surgery     1965 or 1966 lumbar back surgery  . Bentall procedure 01/24/2012    Procedure: BENTALL PROCEDURE;  Surgeon: Kerin Perna, MD;  Location[facility] OR;  Service: Open Heart Surgery;  Laterality: N/A;  . Coronary artery bypass graft 01/24/2012    Procedure: CORONARY ARTERY BYPASS GRAFTING (CABG);  Surgeon: Kerin Perna, MD;  Location: [facility] OR;  Service: Open Heart Surgery;  Laterality: N/A;   HPI:  Patient is an 78 year old male with diagnosis of AS s/p CABG on 4/5. Intubated for surgery only. Patient with PMH of esophageal dysmotility and GERD however is complaining of new onset coughing and regurgitation with pos s/p surgery which is worsening in degree. Bedside swallow evaluation ordered.    Assessment/Recommendations/Treatment Plan Suspected Esophageal Findings Suspected Esophageal Findings: Belching;Regurgitation;Other (comment) (h/o esophageal dysmotility)  Patient presents with a suspected primary esophageal dysphagia based on clinical indicators of aspiration noted consistently post swallow with what  patient describes as regurgitation of thin consistency followed by cough and expectoration of mildly thick clear secretions.  Solids consumed without overt s/s of aspiration. Cannot r/o oropharyngeal component at this time based on brief intubation, s/p CABG, and s/s on liquids > than solids, however favor esophageal based issue. Plan to proceed with MBS this pm. Will continue current diet with strict use of precautions as thickened liquids trials did not indicate a decreased aspiration risk during bedside exam.  Risk for Aspiration: Moderate Other Related Risk Factors: History of GERD;History of esophageal-related issues  Swallow Evaluation Recommendations Recommended Consults: MBS Diet Recommendations: Regular;Thin liquid Liquid Administration via: Cup;No straw Medication Administration: Crushed with puree Supervision: Patient able to self feed;Full supervision/cueing for compensatory strategies Compensations: Slow rate;Small sips/bites Postural Changes and/or Swallow Maneuvers: Seated upright 90 degrees;Upright 30-60 min after meal Oral Care Recommendations: Oral care BID  Ferdinand Lango MA, CCC-SLP [phone]   Henryk Ursin Meryl 01/29/2012,11:09 AM

## 2012-01-29 NOTE — Procedures (Signed)
Objective Swallowing Evaluation: Modified Barium Swallowing Study  Patient Details  Name: Douglas [name]. MRN: [id number] Date of Birth: November 08, 1927  Today's Date: 01/29/2012  Past Medical History:  Past Medical History  Diagnosis Date  . Dysphagia   . Essential hypertension, benign   . Hyperkalemia   . DJD (degenerative joint disease)   . PMR (polymyalgia rheumatica)   . Gout   . Arthritis   . Aortic sclerosis   . Hx of adenomatous colonic polyps   . Aneurysm of thoracic aorta     ascending. 5.7 x 5.5cm  . Difficult intubation     stated difficulty swallowing, and feel like something throat  . Coronary artery disease   . H/O hiatal hernia   . Esophageal dysmotility   . Fibromyalgia     tingling in legs and feet   Past Surgical History:  Past Surgical History  Procedure Date  . Bilateral knee arthroscopy   . Appendectomy   . Hemorroidectomy   . Left inguinal hernia repair   . Hernia repair   . Back surgery     1965 or 1966 lumbar back surgery  . Bent[place] procedure 01/24/2012    Procedure: BENTALL PROCEDURE;  Surgeon: Kerin Perna, MD;  Location: [facility] OR;  Service: Open Heart Surgery;  Laterality: N/A;  . Coronary artery bypass graft 01/24/2012    Procedure: CORONARY ARTERY BYPASS GRAFTING (CABG);  Surgeon: Kerin Perna, MD;  Location: [facility] OR;  Service: Open Heart Surgery;  Laterality: N/A;   HPI:  Patient is an 78 year old male with diagnosis of AS s/p CABG on 4/5. Intubated for surgery only. Patient with PMH of esophageal dysmotility and GERD however is complaining of new onset coughing and regurgitation with pos s/p surgery which is worsening in degree. Bedside swallow evaluation revealed s/s of aspiration with all liquids warranting objective swallowing study. Barium swallow complete to r/o esophageal injury s/p TEE with negative results.      Recommendation/Prognosis    Patient presents with a suspected primary esophageal based dysphagia with an exacerbated  aspiration risk due to a suspected acute reversible oropharyngeal component. Patient with decreased UES relaxation and what appears to be significant solid food stasis filling esophagus post swallow consistent with known h/o esophageal dysmotility. Acutely however, patient presents with laryngeal and pharyngeal weakness with decreased airway protection, likely resulting in inability to compensate for baseline esophageal deficits and resulting in aspiration of all liquids consistencies tested either before, during, or after (secondary to residuals) the swallow. Solid trials result in an increase in pharyngeal residuals however remain contained within the vallecula and pyriform sinuses long enough for patient to complete dry swallows to clear without aspiration. Risk remains high with all pos, however at this time, recommend that patient continue with soft solids, pudding thick liquids to faciliate use of pharyngeal and laryngeal musculature with least aspiration risk. Hopeful for improvement in overall swallowing function as conditioning in general improves to baseline.   Swallow Evaluation Recommendations Diet Recommendations: Dysphagia 2 (Fine chop);Pudding-thick liquid Liquid Administration via: Spoon Medication Administration: Crushed with puree Supervision: Patient able to self feed;Full supervision/cueing for compensatory strategies Compensations: Slow rate;Small sips/bites;Multiple dry swallows after each bite/sip;Follow solids with liquid Postural Changes and/or Swallow Maneuvers: Seated upright 90 degrees;Upright 30-60 min after meal (if possible, 5-6 small meals per day) Oral Care Recommendations: Oral care BID    SLP Goals  SLP Swallowing Goals Patient will consume recommended diet without observed clinical signs of aspiration with: Modified independent  assistance Swallow Study Goal #1 - Progress: Not Met Patient will utilize recommended strategies during swallow to increase swallowing  safety with: Modified independent assistance Swallow Study Goal #2 - Progress: Not met  Ferdinand Lango MA, CCC-SLP [phone]   Ferdinand Lango Meryl 01/29/2012, 4:22 PM

## 2012-01-29 NOTE — Clinical Documentation Improvement (Signed)
RENAL FAILURE DOCUMENTATION CLARIFICATION QUERY  THIS DOCUMENT IS NOT A PERMANENT PART OF THE MEDICAL RECORD   Please update your documentation within the medical record to reflect your response to this query.                                                                                     01/29/12  Doree Fudge, PA and/or Associates,  In a better effort to capture your patient's severity of illness, reflect appropriate length of stay and utilization of resources, a review of the patient medical record has revealed the following indicators:    "Mild ARI-Creatinine stable at 1.39 (BUN increased)"  Douglas [name] MPA-C     01/29/2012 VAN TRIGT [name],Douglas      01/29/2012  BUN/Cr./GFR this admission: 14/0.83/80     (01/22/12) 11/0.74/84     (01/25/12) 20/0.99/74     (01/26/12) 34/1.26/51     (01/27/12) 48/1.40/45     (01/28/12) 54/1.39/46     (01/29/12)   Based on your clinical judgment, please document in the progress notes and discharge summary if a condition below provides greater specificity regarding the patient's renal status:   - Acute Kidney Injury   - Acute Renal Failure   - Other Condition   - Unable to Clinically Determine   In responding to this query please exercise your independent judgment.  The fact that a query is asked, does not imply that any particular answer is desired or expected.  Reviewed  Mathis Dad RN BSN Certified Clinical Documentation Specialist Cell   [phone]  Health Information Management [place]  TO RESPOND TO THE THIS QUERY, FOLLOW THE INSTRUCTIONS BELOW:  1. If needed, update documentation for the patient's encounter via the notes activity.  2. Access this query again and click edit on the In [employer].  3. After updating, or not, click F2 to complete all highlighted (required) fields concerning your review. Select "additional documentation in the medical record" OR "no additional documentation provided".   4. Click Sign note button.  5. The deficiency will fall out of your In Basket *Please let us know if you are not able to complete this workflow by phone or e-mail (listed below).

## 2012-01-29 NOTE — Progress Notes (Signed)
Physical Therapy Treatment Patient Details Name: Douglas [name]. MRN: [id number] DOB: [birth year] Today's Date: 01/29/2012  PT order Discontinued.  Please re-order to allow for continued PT.  Thanks.    Sunny Schlein, [place] [phone] 01/29/2012, 6:59 AM

## 2012-01-29 NOTE — Progress Notes (Signed)
CARDIAC REHAB PHASE I   PRE:  Rate/Rhythm: 89paced  BP:  Supine: 152/80  Sitting:   Standing:   SaO2: 99%RA  MODE:  Ambulation: 150 ft   POST:  Rate/Rhythem: 89paced  BP:  Supine:   Sitting: 127/60  Standing:    SaO2: 96%RA 1307-1340 Pt walked 150 ft on RA with rolling walker and asst x2. Did not need to sit. Seemed a little stronger today. To recliner after walk. Asked case manager to reorder PT consult.  Duanne Limerick

## 2012-01-30 LAB — BASIC METABOLIC PANEL
Calcium: 8.8 mg/dL (ref 8.4–10.5)
GFR calc Af Amer: 70 mL/min — ABNORMAL LOW (ref >90)
GFR calc non Af Amer: 61 mL/min — ABNORMAL LOW (ref >90)
Potassium: 3.9 mEq/L (ref 3.5–5.1)
Sodium: 138 mEq/L (ref 135–145)

## 2012-01-30 LAB — CBC
MCH: 31.3 pg (ref 26.0–34.0)
MCHC: 33 g/dL (ref 30.0–36.0)
RDW: 14.7 % (ref 11.5–15.5)

## 2012-01-30 MED ORDER — LOSARTAN POTASSIUM 25 MG PO TABS
25.0000 mg | ORAL_TABLET | Freq: Every day | ORAL | Status: DC
  Administered 2012-01-30 – 2012-02-06 (×8): 25 mg via ORAL
  Filled 2012-01-30 (×8): qty 1

## 2012-01-30 MED ORDER — STARCH (THICKENING) PO POWD
ORAL | Status: DC | PRN
  Filled 2012-01-30: qty 227

## 2012-01-30 NOTE — Progress Notes (Signed)
Physical Therapy Treatment Patient Details Name: Tylik Treese. MRN: [id number] DOB: 1928/04/01 Today's Date: 01/30/2012  PT Assessment/Plan  PT - Assessment/Plan Comments on Treatment Session: pt s/p CABG and TAA repair.  pt needs encouragement to increase ambulation distance, but has overall improved transfers and is adhering to sternal precautions.   PT Plan: Discharge plan remains appropriate;Frequency remains appropriate PT Frequency: Min 3X/week Follow Up Recommendations: Home health PT;Supervision/Assistance - 24 hour Equipment Recommended: Rolling walker with 5" wheels PT Goals  Acute Rehab PT Goals PT Goal: Sit to Stand - Progress: Progressing toward goal PT Goal: Stand to Sit - Progress: Progressing toward goal PT Goal: Ambulate - Progress: Progressing toward goal  PT Treatment Precautions/Restrictions  Precautions Precautions: Sternal;Fall Required Braces or Orthoses: No Restrictions Weight Bearing Restrictions: No RUE Weight Bearing:  (sternal precautions) LUE Weight Bearing:  (sternal precautions) Other Position/Activity Restrictions: External pacer Mobility (including Balance) Bed Mobility Bed Mobility: No Transfers Transfers: Yes Sit to Stand: 4: Min assist;Without upper extremity assist;From chair/3-in-1 Sit to Stand Details (indicate cue type and reason): cues for hands to knees and pt uses momentum to stand.  Much improved today.   Stand to Sit: 4: Min assist;Without upper extremity assist;To chair/3-in-1 Stand to Sit Details: cues for hands to knees and control descent.  A to slow descent only.   Ambulation/Gait Ambulation/Gait: Yes Ambulation/Gait Assistance: 4: Min assist Ambulation/Gait Assistance Details (indicate cue type and reason): cues for upright posture, decrease WBing on UEs, encouragement.  pt self-limits distance stating he feels weak, however after ambulation pt notes feeling fine.   Ambulation Distance (Feet): 150 Feet Assistive device:  Rolling walker Gait Pattern: Decreased stride length;Trunk flexed Stairs: No Wheelchair Mobility Wheelchair Mobility: No  Posture/Postural Control Posture/Postural Control: No significant limitations Balance Balance Assessed: No Exercise    End of Session PT - End of Session Equipment Utilized During Treatment: Gait belt Activity Tolerance: Patient tolerated treatment well Patient left: in chair;with call bell in reach;with family/visitor present Nurse Communication: Mobility status for transfers;Mobility status for ambulation General Behavior During Session: [facility] for tasks performed Cognition: [facility] for tasks performed  Sunny Schlein, [place] [phone] 01/30/2012, 12:01 PM

## 2012-01-30 NOTE — Progress Notes (Signed)
Patient remains severely bradycardic 1 wk post op and will need perm pacer-contact EP in am.

## 2012-01-30 NOTE — Progress Notes (Addendum)
6 Days Post-Op Procedure(s) (LRB): BENTALL PROCEDURE (N/A) CORONARY ARTERY BYPASS GRAFTING (CABG) (N/A)  Subjective: Patient feeling better this am. Starting to have loose stools.  Objective: Vital signs in last 24 hours: Patient Vitals for the past 24 hrs:  BP Temp Temp src Pulse Resp SpO2 Height Weight  01/30/12 0537 157/84 mmHg 98.7 F (37.1 C) Oral 89  18  91 % - 250 lb 10.6 oz (113.7 kg)  01/29/12 2201 135/78 mmHg 98.8 F (37.1 C) Oral 89  18  93 % - -  01/29/12 1400 159/89 mmHg 97.7 F (36.5 C) Oral 89  20  94 % - -  01/29/12 1213 - - - - - - 5\' 9"  (1.753 m) 245 lb (111.131 kg)   Pre op weight  108 kg Current Weight  01/30/12 250 lb 10.6 oz (113.7 kg)      Intake/Output from previous day: 04/09 0701 - 04/10 0700 In: -  Out: 1352 [Urine:1350; Stool:2]   Physical Exam:  Cardiovascular: RRR, no murmurs, gallops, or rubs. Pulmonary: Diminished at bases; no rales, wheezes, or rhonchi. Abdomen: Soft, non tender, bowel sounds present. Extremities: Mild bilateral lower extremity edema. Wounds: Clean and dry.  No erythema or signs of infection.  Lab Results: CBC:  Basename 01/30/12 0520 01/29/12 0525  WBC 15.5* 17.6*  HGB 9.0* 9.2*  HCT 27.3* 27.4*  PLT 235 230   BMET:   Basename 01/30/12 0520 01/29/12 0525  NA 138 136  K 3.9 4.3  CL 103 100  CO2 25 24  GLUCOSE 118* 139*  BUN 48* 54*  CREATININE 1.10 1.39*  CALCIUM 8.8 8.9    PT/INR: No results found for this basename: LABPROT,INR in the last 72 hours ABG:  INR: Will add last result for INR, ABG once components are confirmed Will add last 4 CBG results once components are confirmed  Assessment/Plan:  1. CV - SB/Junctional/AV paced Not on beta blocker.Hopefully, conductions system continues to recover and he will not need a PPM.SBP now in 150's. Will restart low dose Cozaar. 2.  Pulmonary - Encourage incentive spirometer.CXR this am shows low lung volumes, left base atelectasis, small b/l pleural  effusions. 3. Mild volume overload -Lasix on hold as Bun was still high. 4.  Acute blood loss anemia - H/H this am 9.0/27.3.Continue Nu Iron. 5.Creatinine now normalized at 1.1. 6.Leukocytosis-WBC decreased from 17,600 to 15,500. Is afebrile.No signs of wound infection and UA negative.  Central line removed yesterday. 7.GI-No evidence of esophageal perforation.Will follow recommendations for dysphagia 2 (fine chop) diet. 8.Stop stool softeners.  [name],Douglas MPA-C 01/30/2012   patient examined and medical record reviewed,agree with above note. VAN TRIGT III,Douglas [name] 01/30/2012

## 2012-01-30 NOTE — Progress Notes (Signed)
Speech Language Pathology Dysphagia Treatment  Patient Details Name: Douglas [name]. MRN: [id number] DOB: 1928-01-21 Today's Date: 01/30/2012  SLP Assessment/Plan/Recommendation Assessment / Recommendations / Plan Clinical Impression Statement: Pt was seen by SLP for f/u to assess current diet tolerance. Pt with an immediate cough x1 followed by a wet vocal quality, which he spontaneously cleared with a throat clear. Suspect possible penetration given results of MBSS 4/9, however pt appears to clear possible penetrates spontaneously. Pt utilized multiple swallows with supervision from SLP and received minimal cues to remain seated upright following PO intake and to consume 5-6 smaller meals throughout the day. Pt also received min verbal cues to use purees that are more moist (such as applesauce) to facilitate esophageal clearance of other POs that might be thicker. Overall, pt remains at a heightened risk for aspiration/penetration, however his current diet reduces that risk. Pt is not ready at this time to advance, however SLP will continue to f/u to assess readiness for upgraded trials. SLP provided education regarding diet recommendation, compensatory strategies, and swallow precautions. Continue with Current Diet: Dysphagia 2 (fine chop);Pudding-thick liquid Liquids provided via: Teaspoon Medication Administration: Crushed with puree Supervision: Patient able to self feed;Full supervision/cueing for compensatory strategies Compensations: Slow rate;Small sips/bites;Multiple dry swallows after each bite/sip;Follow solids with liquid Postural Changes and/or Swallow Maneuvers: Seated upright 90 degrees;Upright 30-60 min after meal Oral Care Recommendations: Oral care BID Plan: Continue with current plan of care Swallowing Goals  SLP Swallowing Goals Patient will consume recommended diet without observed clinical signs of aspiration with: Modified independent assistance Swallow Study Goal #1 -  Progress: Progressing toward goal Patient will utilize recommended strategies during swallow to increase swallowing safety with: Modified independent assistance Swallow Study Goal #2 - Progress: Progressing toward goal  General Temperature Spikes Noted: No Respiratory Status: Room air Behavior/Cognition: Alert;Cooperative;Pleasant mood Oral Cavity - Dentition: Adequate natural dentition Patient Positioning: Upright in chair  Oral Cavity - Oral Hygiene Does patient have any of the following "at risk" factors?: Diet - patient on thickened liquids Brush patient's teeth BID with toothbrush (using toothpaste with fluoride): Yes Patient is AT RISK - Oral Care Protocol followed (see row info): Yes   Dysphagia Treatment Treatment focused on: Skilled observation of diet tolerance;Patient/family/caregiver education;Utilization of compensatory strategies (multiple swallows) Family/Caregiver Educated: N/A Treatment Methods/Modalities: Skilled observation;Other (comment) (multiple swallows) Patient observed directly with PO's: Yes Type of PO's observed: Dysphagia 1 (puree);Pudding-thick liquids Feeding: Able to feed self Liquids provided via: Teaspoon Pharyngeal Phase Signs & Symptoms: Immediate cough;Other (comment) (cough 1x followed by wet vocal quality-spontaneously cleared) Type of cueing: Verbal Amount of cueing:  (Supervision)   Maxcine Ham 01/30/2012, 10:14 AM  Maxcine Ham, SLP Student

## 2012-01-30 NOTE — Progress Notes (Signed)
CARDIAC REHAB PHASE I   PRE:  Rate/Rhythm: 89PACING  BP:  Supine:   Sitting: 130/69  Standing:    SaO2: 94%RA  MODE:  Ambulation: 350 ft   POST:  Rate/Rhythem: 89 PACING  BP:  Supine:   Sitting:   Standing:    SaO2:  To bathroom. Told to use call light when ready 1420-1440 Pt walked 350 ft on RA with asst x 2 and rolling walker. Sat in chair once halfway to rest. Tired by end of walk but tolerated well. Needs to increase distance with encouragement.  Duanne Limerick

## 2012-01-31 DIAGNOSIS — I442 Atrioventricular block, complete: Secondary | ICD-10-CM

## 2012-01-31 MED ORDER — ENSURE PUDDING PO PUDG
1.0000 | Freq: Two times a day (BID) | ORAL | Status: DC
  Administered 2012-01-31 – 2012-02-03 (×5): 1 via ORAL

## 2012-01-31 NOTE — Progress Notes (Signed)
INITIAL ADULT NUTRITION ASSESSMENT Date: 01/31/2012   Time: 10:43 AM  Reason for Assessment: Dysphagia 2-Pudding thick liquids diet; Nutrition Risk report (unintentional weight loss)  ASSESSMENT: Male 78 y.o.  Dx: AS, CAD, Ascending Aneurysm; S/P CABG (4/4)  Hx:  Past Medical History  Diagnosis Date  . Dysphagia   . Essential hypertension, benign   . Hyperkalemia   . DJD (degenerative joint disease)   . PMR (polymyalgia rheumatica)   . Gout   . Arthritis   . Aortic sclerosis   . Hx of adenomatous colonic polyps   . Aneurysm of thoracic aorta     ascending. 5.7 x 5.5cm  . Difficult intubation     stated difficulty swallowing, and feel like something throat  . Coronary artery disease   . H/O hiatal hernia   . Esophageal dysmotility   . Fibromyalgia     tingling in legs and feet    Related Meds:   Scheduled Meds:   . guaiFENesin  600 mg Oral BID  . iron polysaccharides  150 mg Oral Daily  . losartan  25 mg Oral Daily  . pantoprazole  40 mg Oral Q1200  . sodium chloride  3 mL Intravenous Q12H   Continuous Infusions:  PRN Meds:.sodium chloride, acetaminophen, diphenhydrAMINE, food thickener, ondansetron (ZOFRAN) IV, phenol, sodium chloride, sodium chloride, traMADol   Ht: 5\' 9"  (175.3 cm)  Wt: 241 lb 2.9 oz (109.4 kg)  Ideal Wt: 72.7 kg % Ideal Wt: 150%  Usual Wt: stable at 237 lb since February 2013 % Usual Wt: 102%  Body mass index is 35.62 kg/(m^2). Class 2 obesity  Food/Nutrition Related Hx: Some difficulty swallowing pills PTA, but did okay with thin liquids and solids PTA.  Patient reports that he lost weight at the end of last year intentionally per his physician's recommendation.  Labs:  CMP     Component Value Date/Time   NA 138 01/30/2012 0520   K 3.9 01/30/2012 0520   CL 103 01/30/2012 0520   CO2 25 01/30/2012 0520   GLUCOSE 118* 01/30/2012 0520   BUN 48* 01/30/2012 0520   CREATININE 1.10 01/30/2012 0520   CREATININE 0.99 01/03/2012 1644   CALCIUM 8.8 01/30/2012 0520   PROT 6.7 01/22/2012 1014   ALBUMIN 3.3* 01/22/2012 1014   AST 19 01/22/2012 1014   ALT 16 01/22/2012 1014   ALKPHOS 86 01/22/2012 1014   BILITOT 0.5 01/22/2012 1014   GFRNONAA 61* 01/30/2012 0520   GFRAA 70* 01/30/2012 0520    CBG (last 3)   Basename 01/29/12 0604 01/28/12 2119 01/28/12 1645  GLUCAP 104* 92 117*     Intake/Output Summary (Last 24 hours) at 01/31/12 1056 Last data filed at 01/31/12 1008  Gross per 24 hour  Intake      6 ml  Output    175 ml  Net   -169 ml     Diet Order: Dysphagia 2 with pudding thick liquids  Estimated Nutritional Needs:   Kcal: 1950-2150 Protein: 100-120 grams Fluid: 2-2.2 liters  Patient with poor intake since admission.  Consuming 20-50% of meals.  Likes the [employer] that are sent on his trays.  Requested more Magic Cups because they tend to "go down easy." Also agreed to Ensure pudding between meals to maximize oral intake.  Patient with increased protein and calorie needs to support healing.  S/P MBS on 4/9 with SLP which revealed esophageal dysphagia and aspiration risk.  Patient was then placed on a dysphagia 2 diet with pudding  thick liquids.  Provided heart healthy education handouts to patient's granddaughter.  NUTRITION DIAGNOSIS: -Inadequate oral intake (NI-2.1).  Status: Ongoing  RELATED TO: difficulty swallowing  AS EVIDENCED BY: 20-50% meal completion  MONITORING/EVALUATION(Goals):  Goal:  Intake to meet >90% of estimated nutrition needs.  Monitor:  PO intake, labs, weight trend.  EDUCATION NEEDS: -Education needs addressed  INTERVENTION:  Ensure Pudding BID.  Magic Cups TID with meals and one as HS snack--each one provides 290 kcals, 9 grams protein.  Dietitian #:  [phone]  DOCUMENTATION CODES Per approved criteria  -Obesity Unspecified    Hettie Holstein 01/31/2012, 10:43 AM

## 2012-01-31 NOTE — Progress Notes (Signed)
Speech Language Pathology Dysphagia Treatment  Patient Details Name: Douglas [name]. MRN: [id number] DOB: Nov 22, 1927 Today's Date: 01/31/2012  SLP Assessment/Plan/Recommendation Assessment / Recommendations / Plan Clinical Impression Statement: Pt was seen by SLP to assess current diet tolerance and provide upgraded trials of liquids. No overt s/s of aspiration were observed, however pt with c/o a globus sensation with pureed solids and liquids. Patient is modified independence to use multiple swallows. Although pt did not present with overt s/s of aspiration across liquid trials, he is scheduled for a procedure tomorrow to insert a pacemaker. Recommend f/u with pt Saturday to assess diet tolerance and readiness for a repeat MBS to be done when recovered from surgery prior to upgrading diet given severity of dysphagia. Until that time, pt is appropriate to utilize the free water protocol. Pt was instructed to stop drinking water if significant coughing ensues. SLP to reassess appropriateness for free water protocol at f/u s/p surgery. At this time, pt is safe to continue current dys 2 (chopped) diet with pudding thick liquids. Pt educated regarding diet recommendation, swallow precautions, and compensatory strategies as well as the free water protocol and the thorough oral care that it entails.  Continue with Current Diet: Dysphagia 2 (fine chop);Pudding-thick liquid Initiate / Change Diet: Other (comment) (Initiate free water protocol)  1.  No water with other pos  2. Wait 30 minutes after consumption of other pos  3. Complete thorough oral care  4. Patient may consume thin H20 only in between meals and after thorough oral care.  Liquids provided via: Teaspoon Medication Administration: Crushed with puree Supervision: Patient able to self feed;Full supervision/cueing for compensatory strategies Compensations: Slow rate;Small sips/bites;Multiple dry swallows after each bite/sip;Follow solids with  liquid Postural Changes and/or Swallow Maneuvers: Seated upright 90 degrees;Upright 30-60 min after meal Oral Care Recommendations: Oral care BID Plan: Continue with current plan of care Swallowing Goals  SLP Swallowing Goals Patient will consume recommended diet without observed clinical signs of aspiration with: Modified independent assistance Swallow Study Goal #1 - Progress: Progressing toward goal Patient will utilize recommended strategies during swallow to increase swallowing safety with: Modified independent assistance Swallow Study Goal #2 - Progress: Progressing toward goal  General Temperature Spikes Noted: No Respiratory Status: Room air Behavior/Cognition: Alert;Cooperative;Pleasant mood Oral Cavity - Dentition: Adequate natural dentition Patient Positioning: Upright in chair  Oral Cavity - Oral Hygiene Does patient have any of the following "at risk" factors?: Diet - patient on thickened liquids Brush patient's teeth BID with toothbrush (using toothpaste with fluoride): Yes Patient is AT RISK - Oral Care Protocol followed (see row info): Yes   Dysphagia Treatment Treatment focused on: Skilled observation of diet tolerance;Upgraded PO texture trials;Patient/family/caregiver education;Utilization of compensatory strategies Treatment Methods/Modalities: Skilled observation;Other (comment) (upgraded trials of liquids) Patient observed directly with PO's: Yes Type of PO's observed: Dysphagia 1 (puree);Thin liquids;Nectar-thick liquids;Honey-thick liquids Feeding: Able to feed self Liquids provided via: Cup;No straw Pharyngeal Phase Signs & Symptoms: Complaints of globus Type of cueing: Verbal Amount of cueing: Minimal   Maxcine Ham 01/31/2012, 4:50 PM   Maxcine Ham, SLP Student

## 2012-01-31 NOTE — Progress Notes (Signed)
CARDIAC REHAB PHASE I   PRE:  Rate/Rhythm: 88Paced  BP:  Supine:   Sitting: 121/47  Standing:    SaO2: 97%RA  MODE:  Ambulation: 350 ft   POST:  Rate/Rhythem: 88 Paced  BP:  Supine:   Sitting: 127/48  Standing:    SaO2: 96%RA [id number] Pt walked 350 ft on RA with rolling walker and asst x 2. Followed with chair and pt had to sit down once halfway to rest. C/o dry mouth. Tolerated well. To recliner with call bell after walk.   Douglas [name]

## 2012-01-31 NOTE — Consult Note (Signed)
  Reason for Consult:CHB after Bental/AVR  Referring Physician: Sherre Lain. is an 78 y.o. male.   HPI: The patient is an 78 yo man with a h/o Aortic stenosis, CAD, Ascending aortic aneursym who underwent AVR/Bentall procedure several days ago with the procedure complicated by CHB. He has remained PPM dependent. He has had LBBB in the past. No syncope. His post-operative course has been otherwise uncomplicated. He denies palpitations. He has preserved LV function.  PMH: Past Medical History  Diagnosis Date  . Dysphagia   . Essential hypertension, benign   . Hyperkalemia   . DJD (degenerative joint disease)   . PMR (polymyalgia rheumatica)   . Gout   . Arthritis   . Aortic sclerosis   . Hx of adenomatous colonic polyps   . Aneurysm of thoracic aorta     ascending. 5.7 x 5.5cm  . Difficult intubation     stated difficulty swallowing, and feel like something throat  . Coronary artery disease   . H/O hiatal hernia   . Esophageal dysmotility   . Fibromyalgia     tingling in legs and feet    PSHX: Past Surgical History  Procedure Date  . Bilateral knee arthroscopy   . Appendectomy   . Hemorroidectomy   . Left inguinal hernia repair   . Hernia repair   . Back surgery     1965 or 1966 lumbar back surgery  . Bentall procedure 01/24/2012    Procedure: BENTALL PROCEDURE;  Surgeon: Kerin Perna, MD;  Location: [facility] OR;  Service: Open Heart Surgery;  Laterality: N/A;  . Coronary artery bypass graft 01/24/2012    Procedure: CORONARY ARTERY BYPASS GRAFTING (CABG);  Surgeon: Kerin Perna, MD;  Location: [facility] OR;  Service: Open Heart Surgery;  Laterality: N/A;    FAMHX: Family History  Problem Relation Age of Onset  . Kidney disease Mother   . Heart disease Father   . Anesthesia problems Neg Hx   . Hypotension Neg Hx   . Malignant hyperthermia Neg Hx   . Pseudochol deficiency Neg Hx     Social History:  reports that he has never smoked. He has never used  smokeless tobacco. He reports that he does not drink alcohol or use illicit drugs.  Allergies: No Known Allergies  Medications: reviewed  No results found.  ROS  As stated in the HPI and negative for all other systems.  Physical Exam  Well appearing elderly man, NAD HEENT: Unremarkable Neck:  No JVD, no thyromegally Lymphatics:  No adenopathy Back:  No CVA tenderness Lungs:  Clear with no wheezes, rales, or rhonchi HEART:  Regular rate rhythm, no murmurs, no rubs, no clicks. S2 is split. Abd:  Flat, positive bowel sounds, no organomegally, no rebound, no guarding Ext:  2 plus pulses, no edema, no cyanosis, no clubbing Skin:  No rashes no nodules Neuro:  CN II through XII intact, motor grossly intact Vitals:Blood pressure 121/71, pulse 87, temperature 98.5 F (36.9 C), temperature source Oral, resp. rate 18, height 5\' 9"  (1.753 m), weight 109.4 kg (241 lb 2.9 oz), SpO2 97.00%.  ECG - AV sequential pacing is present. CXR - reviewed Assessment/Plan: 1. Post op CHB 2. S/P AVR/Bentall 3. HTN Rec: I have discussed the indications, risks/benefits/goals and expectations of PPM with the patient and he wishes to proceed.  Sharlot Gowda TaylorMD 01/31/2012, 5:13 PM

## 2012-01-31 NOTE — Progress Notes (Addendum)
7 Days Post-Op Procedure(s) (LRB): BENTALL PROCEDURE (N/A) CORONARY ARTERY BYPASS GRAFTING (CABG) (N/A)  Subjective: Patient eating breakfast. No real complaints this am.  Objective: Vital signs in last 24 hours: Patient Vitals for the past 24 hrs:  BP Temp Temp src Pulse Resp SpO2 Weight  01/31/12 0422 129/75 mmHg 97.6 F (36.4 C) Oral 87  18  96 % 241 lb 2.9 oz (109.4 kg)  01/30/12 1946 121/64 mmHg 98.7 F (37.1 C) Oral 87  18  92 % -   Pre op weight  108 kg Current Weight  01/31/12 241 lb 2.9 oz (109.4 kg)      Intake/Output from previous day: 04/10 0701 - 04/11 0700 In: 3 [I.V.:3] Out: -    Physical Exam:  Cardiovascular: RRR, no murmurs, gallops, or rubs. Pulmonary: Diminished at bases; no rales, wheezes, or rhonchi. Abdomen: Soft, non tender, bowel sounds present. Extremities: Mild bilateral lower extremity edema. Wounds: Clean and dry.  No erythema or signs of infection.  Lab Results: CBC:  Basename 01/30/12 0520 01/29/12 0525  WBC 15.5* 17.6*  HGB 9.0* 9.2*  HCT 27.3* 27.4*  PLT 235 230   BMET:   Basename 01/30/12 0520 01/29/12 0525  NA 138 136  K 3.9 4.3  CL 103 100  CO2 25 24  GLUCOSE 118* 139*  BUN 48* 54*  CREATININE 1.10 1.39*  CALCIUM 8.8 8.9    PT/INR: No results found for this basename: LABPROT,INR in the last 72 hours ABG:  INR: Will add last result for INR, ABG once components are confirmed Will add last 4 CBG results once components are confirmed  Assessment/Plan:  1. CV - SB/Junctional/AV paced Not on beta blocker.Continue  low dose Cozaar.Will need PPM. 2.  Pulmonary - Encourage incentive spirometer.CXR this am shows low lung volumes, left base atelectasis, small b/l pleural effusions. 3. Mild volume overload -Lasix on hold as Bun was still high.Recheck in am. 4.  Acute blood loss anemia - H/H this am 9.0/27.3.Continue Nu Iron. 5.Creatinine now normalized at 1.1. 6.Leukocytosis-WBC decreased from 17,600 to 15,500. Is  afebrile.No signs of wound infection and UA negative.  Central line removed .Recheck in am. 7.GI-No evidence of esophageal perforation.Will follow recommendations for dysphagia 2 (fine chop) diet. 8.Stop stool softeners.  ZIMMERMAN,DONIELLE MPA-C 01/31/2012    patient examined and medical record reviewed,agree with above note. VAN TRIGT III,Bryce Cheever 01/31/2012

## 2012-02-01 ENCOUNTER — Encounter (HOSPITAL_COMMUNITY)
Admission: EM | Disposition: A | Discharge status: Home Health | Payer: Self-pay | Source: Ambulatory Visit | Attending: Cardiothoracic Surgery

## 2012-02-01 DIAGNOSIS — I442 Atrioventricular block, complete: Secondary | ICD-10-CM

## 2012-02-01 HISTORY — PX: PERMANENT PACEMAKER INSERTION: SHX5480

## 2012-02-01 LAB — URINALYSIS, ROUTINE W REFLEX MICROSCOPIC
Bilirubin Urine: NEGATIVE
Glucose, UA: NEGATIVE mg/dL
Hgb urine dipstick: NEGATIVE
Ketones, ur: NEGATIVE mg/dL
Leukocytes, UA: NEGATIVE
Nitrite: NEGATIVE
Protein, ur: NEGATIVE mg/dL
Specific Gravity, Urine: 1.016 (ref 1.005–1.030)
Urobilinogen, UA: 1 mg/dL (ref 0.0–1.0)
pH: 5 (ref 5.0–8.0)

## 2012-02-01 LAB — BASIC METABOLIC PANEL
Calcium: 8.7 mg/dL (ref 8.4–10.5)
GFR calc non Af Amer: 66 mL/min — ABNORMAL LOW (ref >90)
Glucose, Bld: 125 mg/dL — ABNORMAL HIGH (ref 70–99)
Potassium: 3.7 mEq/L (ref 3.5–5.1)
Sodium: 146 mEq/L — ABNORMAL HIGH (ref 135–145)

## 2012-02-01 LAB — CBC
Hemoglobin: 9.2 g/dL — ABNORMAL LOW (ref 13.0–17.0)
MCHC: 31.7 g/dL (ref 30.0–36.0)
Platelets: 239 10*3/uL (ref 150–400)
RBC: 2.95 MIL/uL — ABNORMAL LOW (ref 4.22–5.81)

## 2012-02-01 SURGERY — PERMANENT PACEMAKER INSERTION
Anesthesia: LOCAL

## 2012-02-01 MED ORDER — SODIUM CHLORIDE 0.45 % IV SOLN
INTRAVENOUS | Status: DC
  Administered 2012-02-01: 11:00:00 via INTRAVENOUS

## 2012-02-01 MED ORDER — FUROSEMIDE 40 MG PO TABS
40.0000 mg | ORAL_TABLET | Freq: Every day | ORAL | Status: DC
  Administered 2012-02-01 – 2012-02-04 (×4): 40 mg via ORAL
  Filled 2012-02-01 (×7): qty 1

## 2012-02-01 MED ORDER — CEFAZOLIN SODIUM-DEXTROSE 2-3 GM-% IV SOLR
2.0000 g | INTRAVENOUS | Status: AC
  Administered 2012-02-01: 2 g via INTRAVENOUS
  Filled 2012-02-01: qty 50

## 2012-02-01 MED ORDER — GENTAMICIN SULFATE 40 MG/ML IJ SOLN
80.0000 mg | INTRAMUSCULAR | Status: DC
  Filled 2012-02-01: qty 2

## 2012-02-01 MED ORDER — FENTANYL CITRATE 0.05 MG/ML IJ SOLN
INTRAMUSCULAR | Status: AC
  Filled 2012-02-01: qty 2

## 2012-02-01 MED ORDER — HEPARIN (PORCINE) IN NACL 2-0.9 UNIT/ML-% IJ SOLN
INTRAMUSCULAR | Status: AC
  Filled 2012-02-01: qty 1000

## 2012-02-01 MED ORDER — MIDAZOLAM HCL 5 MG/5ML IJ SOLN
INTRAMUSCULAR | Status: AC
  Filled 2012-02-01: qty 5

## 2012-02-01 MED ORDER — HYDROCODONE-ACETAMINOPHEN 5-325 MG PO TABS: 1.0000 | ORAL_TABLET | ORAL | Status: DC | PRN

## 2012-02-01 MED ORDER — CEFAZOLIN SODIUM 1-5 GM-% IV SOLN
1.0000 g | Freq: Four times a day (QID) | INTRAVENOUS | Status: AC
  Administered 2012-02-01 – 2012-02-02 (×3): 1 g via INTRAVENOUS
  Filled 2012-02-01 (×3): qty 50

## 2012-02-01 MED ORDER — LIDOCAINE HCL (PF) 1 % IJ SOLN
INTRAMUSCULAR | Status: AC
  Filled 2012-02-01: qty 60

## 2012-02-01 MED ORDER — ACETAMINOPHEN 325 MG PO TABS: 325.0000 mg | ORAL_TABLET | ORAL | Status: DC | PRN

## 2012-02-01 MED ORDER — POTASSIUM CHLORIDE CRYS ER 20 MEQ PO TBCR
20.0000 meq | EXTENDED_RELEASE_TABLET | Freq: Every day | ORAL | Status: DC
  Administered 2012-02-01 – 2012-02-05 (×5): 20 meq via ORAL
  Filled 2012-02-01 (×5): qty 1

## 2012-02-01 MED ORDER — CHLORHEXIDINE GLUCONATE 4 % EX LIQD
60.0000 mL | Freq: Once | CUTANEOUS | Status: AC
  Administered 2012-02-01: 4 via TOPICAL
  Filled 2012-02-01 (×2): qty 60

## 2012-02-01 MED ORDER — SODIUM CHLORIDE 0.9 % IV SOLN: INTRAVENOUS | Status: DC

## 2012-02-01 MED ORDER — ONDANSETRON HCL 4 MG/2ML IJ SOLN
4.0000 mg | Freq: Four times a day (QID) | INTRAMUSCULAR | Status: DC | PRN
  Administered 2012-02-03: 4 mg via INTRAVENOUS
  Filled 2012-02-01: qty 2

## 2012-02-01 NOTE — Progress Notes (Signed)
   CARE MANAGEMENT NOTE 02/01/2012  Patient:  Douglas [name], Douglas [name]   Account Number:  [id number]  Date Initiated:  01/28/2012  Documentation initiated by:  Rosanne Wohlfarth  Subjective/Objective Assessment:   PT S/P CABG X 4 ON 01/24/12.  PTA, PT INDEPENDENT, LIVES WITH WIFE.     Action/Plan:   MET WITH PT TO DISCUSS DC PLANS.   HE STATES SPOUSE WILL PROVIDE 24HR CARE AT DISCHARGE.  HE HAS RW AT HOME, IF NEEDED.   Anticipated DC Date:  01/31/2012   Anticipated DC Plan:  HOME W HOME HEALTH SERVICES      DC Planning Services  CM consult      Choice offered to / List presented to:             Status of service:  In process, will continue to follow Medicare Important Message given?   (If response is "NO", the following Medicare IM given date fields will be blank) Date Medicare IM given:   Date Additional Medicare IM given:    Discharge Disposition:    Per UR Regulation:    If discussed at Long Length of Stay Meetings, dates discussed:    Comments:  02/01/12 Naseem Adler,RN,BSN 1153 PT TO HAVE PERMANENT PACEMAKER PLACED TODAY FOR COMPLETE HEART BLOCK.  WILL CONT TO FOLLOW POST PROCEDURE. Phone #[phone]   01/28/12 Ferol Laiche,RN,BSN [id number] WILL CONT TO FOLLOW PT FOR HOME NEEDS AS HE PROGRESSES. Phone #[phone]

## 2012-02-01 NOTE — H&P (Signed)
Referring Physician: VanTright   HPI: The patient is an 78 yo man with a h/o Aortic stenosis, CAD, Ascending aortic aneursym who underwent AVR/Bentall procedure several days ago with subsequent persistent CHB. He has remained PPM dependent. He has had LBBB in the past. No syncope. His post-operative course has been otherwise uncomplicated. He denies palpitations. He has preserved LV function.  PMH:  Past Medical History   Diagnosis  Date   .  Dysphagia    .  Essential hypertension, benign    .  Hyperkalemia    .  DJD (degenerative joint disease)    .  PMR (polymyalgia rheumatica)    .  Gout    .  Arthritis    .  Aortic sclerosis    .  Hx of adenomatous colonic polyps    .  Aneurysm of thoracic aorta      ascending. 5.7 x 5.5cm   .  Difficult intubation      stated difficulty swallowing, and feel like something throat   .  Coronary artery disease    .  H/O hiatal hernia    .  Esophageal dysmotility    .  Fibromyalgia      tingling in legs and feet    PSHX:  Past Surgical History   Procedure  Date   .  Bilateral knee arthroscopy    .  Appendectomy    .  Hemorroidectomy    .  Left inguinal hernia repair    .  Hernia repair    .  Back surgery      1965 or 1966 lumbar back surgery   .  Bentall procedure  01/24/2012     Procedure: BENTALL PROCEDURE; Surgeon: Kerin Perna, MD; Location: [facility] OR; Service: Open Heart Surgery; Laterality: N/A;   .  Coronary artery bypass graft  01/24/2012     Procedure: CORONARY ARTERY BYPASS GRAFTING (CABG); Surgeon: Kerin Perna, MD; Location[facility] OR; Service: Open Heart Surgery; Laterality: N/A;    FAMHX:  Family History   Problem  Relation  Age of Onset   .  Kidney disease  Mother    .  Heart disease  Father    .  Anesthesia problems  Neg Hx    .  Hypotension  Neg Hx    .  Malignant hyperthermia  Neg Hx    .  Pseudochol deficiency  Neg Hx     Social History: reports that he has never smoked. He has never used smokeless tobacco. He reports  that he does not drink alcohol or use illicit drugs.  Allergies: No Known Allergies  Medications: reviewed  No results found.  ROS  As stated in the HPI and negative for all other systems.  Physical Exam  Well appearing elderly man, NAD  HEENT: Unremarkable  Neck: No JVD, no thyromegally  Lymphatics: No adenopathy  Back: No CVA tenderness  Lungs: Clear with no wheezes, rales, or rhonchi  HEART: Regular rate rhythm, no murmurs, no rubs, no clicks. S2 is split.  Abd: Flat, positive bowel sounds, no organomegally, no rebound, no guarding  Ext: 2 plus pulses, no edema, no cyanosis, no clubbing  Skin: No rashes no nodules  Neuro: CN II through XII intact, motor grossly intact  Vitals:Blood pressure 121/71, pulse 87, temperature 98.5 F (36.9 C), temperature source Oral, resp. rate 18, height 5\' 9"  (1.753 m), weight 109.4 kg (241 lb 2.9 oz), SpO2 97.00%.  ECG - AV sequential pacing is present.  CXR - reviewed  Assessment/Plan:  The patient has persistent complete heart block.  I would therefore recommend pacemaker implantation at this time.  Risks, benefits, alternatives to pacemaker implantation were discussed in detail with the patient today. The patient understands that the risks include but are not limited to bleeding, infection, pneumothorax, perforation, tamponade, vascular damage, renal failure, MI, stroke, death,  and lead dislodgement and wishes to proceed. We will therefore schedule the procedure at the next available time.

## 2012-02-01 NOTE — Progress Notes (Signed)
UR Completed.  Douglas [name] Jane [phone] 02/01/2012  

## 2012-02-01 NOTE — Progress Notes (Signed)
Physical Therapy Treatment Patient Details Name: Douglas [name]. MRN: [id number] DOB: [birth year] Today's Date: 02/01/2012  PT Assessment/Plan  PT - Assessment/Plan Comments on Treatment Session: Patient s/p CABG and TAA repair.  Going for pacemaker today.  Improving daily.   PT Plan: Discharge plan remains appropriate;Frequency remains appropriate PT Frequency: Min 3X/week Follow Up Recommendations: Home health PT;Supervision/Assistance - 24 hour Equipment Recommended: Rolling walker with 5" wheels PT Goals  Acute Rehab PT Goals PT Goal: Sit to Stand - Progress: Progressing toward goal PT Goal: Stand to Sit - Progress: Progressing toward goal PT Goal: Ambulate - Progress: Progressing toward goal  PT Treatment Precautions/Restrictions  Precautions Precautions: Sternal;Fall Required Braces or Orthoses DO NOT USE: No Restrictions Weight Bearing Restrictions: No RUE Weight Bearing:  (sternal precautions) LUE Weight Bearing:  (sternal precautions) Other Position/Activity Restrictions: sternal precautions and external pacer Mobility (including Balance) Bed Mobility Supine to Sit: Not tested (comment) Transfers Sit to Stand: 4: Min assist;Without upper extremity assist;From chair/3-in-1 Sit to Stand Details (indicate cue type and reason): cues for hands on knees. Stand to Sit: 4: Min assist;Without upper extremity assist;To chair/3-in-1 Stand to Sit Details: cues for hands on knees and did need assist to control descent Ambulation/Gait Ambulation/Gait Assistance: 4: Min assist Ambulation/Gait Assistance Details (indicate cue type and reason): cues for staying close to RW and standing up tall Ambulation Distance (Feet): 200 Feet Assistive device: Rolling walker Gait Pattern: Decreased stride length;Trunk flexed Stairs: No Wheelchair Mobility Wheelchair Mobility: No  Posture/Postural Control Posture/Postural Control: No significant limitations Balance Balance Assessed: No    End of Session PT - End of Session Equipment Utilized During Treatment: Gait belt Activity Tolerance: Patient tolerated treatment well Patient left: in chair;with call bell in reach;with family/visitor present Nurse Communication: Mobility status for transfers;Mobility status for ambulation General Behavior During Session: [name] for tasks performed Cognition: [name] for tasks performed  Douglas [name] 02/01/2012, 2:22 PM [facility] Acute Rehabilitation [phone] (pager)

## 2012-02-01 NOTE — Progress Notes (Addendum)
                    [address]            [place] [address]          [phone]     8 Days Post-Op Procedure(s) (LRB): BENTALL PROCEDURE (N/A) CORONARY ARTERY BYPASS GRAFTING (CABG) (N/A)  Subjective: Tired this am, frustrated with progress.  Objective: Vital signs in last 24 hours: Patient Vitals for the past 24 hrs:  BP Temp Temp src Pulse Resp SpO2 Weight  02/01/12 0442 117/72 mmHg 98.5 F (36.9 C) Oral 94  18  97 % 109 kg (240 lb 4.8 oz)  01/31/12 1954 115/68 mmHg 99.3 F (37.4 C) Oral 88  18  95 % -  01/31/12 1402 121/71 mmHg 98.5 F (36.9 C) Oral 87  18  97 % -   Current Weight  02/01/12 109 kg (240 lb 4.8 oz)  Pre op weight 108 kg    Intake/Output from previous day: 04/11 0701 - 04/12 0700 In: 246 [P.O.:240; I.V.:6] Out: 175 [Urine:175]    PHYSICAL EXAM:  Heart: RRR, paced Lungs: decreased BS in bases Wound:clean and dry Extremities: mild LE edema  Lab Results: CBC: Basename 02/01/12 0514 01/30/12 0520  WBC 18.1* 15.5*  HGB 9.2* 9.0*  HCT 29.0* 27.3*  PLT 239 235   BMET:  Basename 02/01/12 0514 01/30/12 0520  NA 146* 138  K 3.7 3.9  CL 110 103  CO2 25 25  GLUCOSE 125* 118*  BUN 38* 48*  CREATININE 1.02 1.10  CALCIUM 8.7 8.8    PT/INR: No results found for this basename: LABPROT,INR in the last 72 hours   Assessment/Plan: S/P Procedure(s) (LRB): BENTALL PROCEDURE (N/A) CORONARY ARTERY BYPASS GRAFTING (CABG) (N/A)  CV- CHB, for PPM this am per Dr. Johney Frame. BPs stable on current meds.  Vol overload- resume low dose diuretic since BUN/Cr stable.  Leukocytosis, no fever.  No obvious source of infection.  WBC 17K-15K-18K.  Will continue to observe.  Dysphagia- Continue D2 diet.  CRPI.   LOS: 8 days    COLLINS,GINA H 02/01/2012   patient examined and medical record reviewed,agree with above note  Check UA for inc WBC. VAN TRIGT III,Joua Bake 02/01/2012

## 2012-02-01 NOTE — Progress Notes (Signed)
DC'd external pacer. Pacer is on the window sill at the bedside. Patient tolerated procedure well. Douglas [name]

## 2012-02-01 NOTE — Op Note (Signed)
SURGEON:  Hillis Range, MD     PREPROCEDURE DIAGNOSIS:  Complete heart block    POSTPROCEDURE DIAGNOSIS:   Complete heart block     PROCEDURES:   1.  Pacemaker implantation.     INTRODUCTION: Douglas [name]. is a 78 y.o. male  with a history of Aortic stenosis, CAD, and ascending aortic aneursym who underwent AVR/Bentall procedure several days ago with persistent CHB.  He now presents for pacemaker implantation.      DESCRIPTION OF PROCEDURE:  Informed written consent was obtained, and the patient was brought to the electrophysiology lab in a fasting state.  The patient was adequately sedated with IV Versed and fentanyl for the procedure today.  The patients left chest was prepped and draped in the usual sterile fashion by the EP lab staff. The skin overlying the left deltopectoral region was infiltrated with lidocaine for local analgesia.  A 4-cm incision was made over the left deltopectoral region.  A left subcutaneous pacemaker pocket was fashioned using a combination of sharp and blunt dissection. Electrocautery was required to assure hemostasis.    RA/RV Lead Placement: The left axillary vein was cannulated.  Through the left axillary vein, a Medtronic model [id number] (serial number [id number]) right atrial lead and a Medtronic model [id number] (serial number [id number]) right ventricular lead were advanced with fluoroscopic visualization into the right atrial appendage and right ventricular apex positions respectively.  Initial atrial lead afib waves measured 2 mV with impedance of 609 ohms.  Due to afib, a threshold could not be obtained.  Right ventricular lead R-waves measured 14 mV with an impedance of 1023 ohms and a threshold of 1 V at 0.5 msec.  Both leads were secured to the pectoralis fascia using #2-0 silk over the suture sleeves.   Device Placement:  The leads were then connected to a Medtronic Adapta L model ADDRL 1 (serial number [id number]) pacemaker.  The pocket was irrigated  with copious gentamicin solution.  The pacemaker was then placed into the pocket.  The pocket was then closed in 2 layers with 2.0 Vicryl suture for the subcutaneous and subcuticular layers.  Steri-   Strips and a sterile dressing were then applied.  There were no early apparent complications.     CONCLUSIONS:   1. Successful implantation of a Medtronic Adapta L dual-chamber pacemaker for complete heart block   2. No early apparent complications.           Hillis Range, MD 02/01/2012 2:19 PM

## 2012-02-02 ENCOUNTER — Inpatient Hospital Stay (HOSPITAL_COMMUNITY): Payer: Medicare Other

## 2012-02-02 DIAGNOSIS — I4891 Unspecified atrial fibrillation: Secondary | ICD-10-CM

## 2012-02-02 DIAGNOSIS — I359 Nonrheumatic aortic valve disorder, unspecified: Secondary | ICD-10-CM

## 2012-02-02 LAB — BASIC METABOLIC PANEL
BUN: 38 mg/dL — ABNORMAL HIGH (ref 6–23)
Chloride: 110 mEq/L (ref 96–112)
GFR calc Af Amer: 64 mL/min — ABNORMAL LOW (ref >90)
Potassium: 3.7 mEq/L (ref 3.5–5.1)
Sodium: 146 mEq/L — ABNORMAL HIGH (ref 135–145)

## 2012-02-02 LAB — CBC
HCT: 30.5 % — ABNORMAL LOW (ref 39.0–52.0)
Hemoglobin: 9.5 g/dL — ABNORMAL LOW (ref 13.0–17.0)
RBC: 3.07 MIL/uL — ABNORMAL LOW (ref 4.22–5.81)
WBC: 17.9 10*3/uL — ABNORMAL HIGH (ref 4.0–10.5)

## 2012-02-02 MED ORDER — PATIENT'S GUIDE TO USING COUMADIN BOOK
Freq: Once | Status: AC
  Administered 2012-02-02: 11:00:00
  Filled 2012-02-02: qty 1

## 2012-02-02 MED ORDER — WARFARIN SODIUM 2.5 MG PO TABS
2.5000 mg | ORAL_TABLET | Freq: Every day | ORAL | Status: DC
  Administered 2012-02-02 – 2012-02-05 (×4): 2.5 mg via ORAL
  Filled 2012-02-02 (×5): qty 1

## 2012-02-02 MED ORDER — METOPROLOL TARTRATE 12.5 MG HALF TABLET
12.5000 mg | ORAL_TABLET | Freq: Two times a day (BID) | ORAL | Status: DC
  Administered 2012-02-02 – 2012-02-05 (×7): 12.5 mg via ORAL
  Filled 2012-02-02 (×8): qty 1

## 2012-02-02 MED ORDER — FERROUS SULFATE 300 (60 FE) MG/5ML PO SYRP
300.0000 mg | ORAL_SOLUTION | Freq: Every day | ORAL | Status: DC
  Administered 2012-02-02 – 2012-02-03 (×2): 300 mg via ORAL
  Filled 2012-02-02 (×2): qty 5

## 2012-02-02 MED ORDER — WARFARIN VIDEO
Freq: Once | Status: AC
  Administered 2012-02-02: 11:00:00

## 2012-02-02 MED ORDER — GUAIFENESIN 100 MG/5ML PO SOLN
300.0000 mg | Freq: Four times a day (QID) | ORAL | Status: DC
  Administered 2012-02-02 – 2012-02-03 (×4): 300 mg via ORAL
  Filled 2012-02-02 (×2): qty 15
  Filled 2012-02-02 (×2): qty 118
  Filled 2012-02-02 (×3): qty 15
  Filled 2012-02-02: qty 118

## 2012-02-02 MED ORDER — WARFARIN - PHYSICIAN DOSING INPATIENT
Freq: Every day | Status: DC
  Administered 2012-02-02 – 2012-02-03 (×2)

## 2012-02-02 NOTE — Procedures (Signed)
Objective Swallowing Evaluation:    Patient Details  Name: Douglas [name]. MRN: [id number] Date of Birth: 1928-10-05  Today's Date: 02/02/2012 Time:  -     Past Medical History:  Past Medical History  Diagnosis Date  . Dysphagia   . Essential hypertension, benign   . Hyperkalemia   . DJD (degenerative joint disease)   . PMR (polymyalgia rheumatica)   . Gout   . Arthritis   . Aortic sclerosis   . Hx of adenomatous colonic polyps   . Aneurysm of thoracic aorta     ascending. 5.7 x 5.5cm  . Difficult intubation     stated difficulty swallowing, and feel like something throat  . Coronary artery disease   . H/O hiatal hernia   . Esophageal dysmotility   . Fibromyalgia     tingling in legs and feet   Past Surgical History:  Past Surgical History  Procedure Date  . Bilateral knee arthroscopy   . Appendectomy   . Hemorroidectomy   . Left inguinal hernia repair   . Hernia repair   . Back surgery     1965 or 1966 lumbar back surgery  . Bentall procedure 01/24/2012    Procedure: BENTALL PROCEDURE;  Surgeon: Kerin Perna, MD;  Location: [facility] OR;  Service: Open Heart Surgery;  Laterality: N/A;  . Coronary artery bypass graft 01/24/2012    Procedure: CORONARY ARTERY BYPASS GRAFTING (CABG);  Surgeon: Kerin Perna, MD;  Location: [facility] OR;  Service: Open Heart Surgery;  Laterality: N/A;   HPI:  78 y/o male  referred for repeat MBS for possible diet advancement .      Recommendation/Prognosis  Clinical Impression Dysphagia Diagnosis: Moderate pharyngeal phase dysphagia;Moderate cervical esophageal phase dysphagia Clinical impression: No improvement noted from initial MBS.  Patient appears to have  large cervical osteophte at C-5  (no radiologist present to confirm) affecting complete epilglottic inversion resulting in decreased airway protection. Patient continues to penetrate with all liquid consistencies before swalllow with eventual trace aspiration during swallow. Chin tuck and  instructed effortful swallow not effective in preventing penetration and aspiration . Recommend to continue current diet of dysphagia 2 with pudding thick liquids as risk for aspiration remains high.  Continue  ST treatment in acute care setting with repeat MBS before diet advancement. May consider free water protocol if d/c to next  level  of care  is before repeat MBS.  Swallow Evaluation Recommendations Diet Recommendations: Dysphagia 2 (Fine chop);Pudding-thick liquid Liquid Administration via: Spoon Medication Administration: Crushed with puree Supervision: Patient able to self feed;Full supervision/cueing for compensatory strategies Compensations: Slow rate;Small sips/bites;Multiple dry swallows after each bite/sip;Follow solids with liquid;Effortful swallow;Clear throat intermittently Postural Changes and/or Swallow Maneuvers: Out of bed for meals Oral Care Recommendations: Oral care before and after PO Other Recommendations: Order thickener from pharmacy;Prohibited food (jello, ice cream, thin soups);Clarify dietary restrictions;Remove water pitcher;Have oral suction available Follow up Recommendations: Inpatient Rehab Prognosis Prognosis for Safe Diet Advancement: Guarded Individuals Consulted Consulted and Agree with Results and Recommendations: Patient;RN  SLP Assessment/Plan Dysphagia Diagnosis: Moderate pharyngeal phase dysphagia;Moderate cervical esophageal phase dysphagia Clinical impression: No improvement noted from initial MBS.  Patient appears to have  large cervical osteophte at C-5  (no radiologist present to confirm) affecting complete epilglottic inversion resulting in decreased airway protection. All liquid consistencies would spill from BOT on top of epiglottis with half of bolus pooling in vallecular space and half spilling toward airway.  Patient continues to penetrate with all liquid  consistencies before swalllow with eventual trace aspiration during swallow. Chin tuck  and instructed effortful swallow not effective in preventing penetration and aspiration . Recommend to continue current diet of dysphagia 2 with pudding thick liquids as risk for aspiration remains high.  Continue  ST treatment in acute care setting with repeat MBS before diet advancement. May consider free water protocol if d/c to next  level  of care  is before repeat MBS.   SLP Goals  SLP Swallowing Goals Swallow Study Goal #3 - Progress: Progressing toward goal  General:  Date of Onset: 01/25/12 HPI: 78 y/o male HPI: 78 y/o male  referred for repeat MBS for possible diet advancement .  Type of Study: Modified Barium Swallowing Study Previous Swallow Assessment: MBS completed on 01/29/12  Diet Prior to this Study: Dysphagia 2 (chopped);Pudding-thick liquids Temperature Spikes Noted: No Respiratory Status: Room air History of Intubation: Yes Length of Intubations (days): 1 days Date extubated: 01/25/12 Behavior/Cognition: Alert;Cooperative;Requires cueing Oral Cavity - Dentition: Adequate natural dentition Oral Motor / Sensory Function: Within functional limits Vision: Functional for self-feeding Patient Positioning: Upright in chair Baseline Vocal Quality: Wet Volitional Cough: Strong Volitional Swallow: Able to elicit Anatomy: Within functional limits Pharyngeal Secretions: Not observed secondary MBS  Reason for Referral:  Possible diet advancement  Oral Phase Oral Preparation/Oral Phase Oral Phase: WFL Pharyngeal Phase  Pharyngeal Phase Pharyngeal Phase: Impaired Pharyngeal - Honey Pharyngeal - Honey Cup: Reduced epiglottic inversion;Reduced airway/laryngeal closure;Reduced pharyngeal peristalsis;Reduced anterior laryngeal mobility;Reduced laryngeal elevation;Penetration/Aspiration before swallow;Penetration/Aspiration during swallow;Trace aspiration;Pharyngeal residue - pyriform;Pharyngeal residue - posterior pharnyx Penetration/Aspiration details (honey cup): Material enters airway, passes  BELOW cords and not ejected out despite cough attempt by patient;Material enters airway, remains ABOVE vocal cords and not ejected out Pharyngeal - Nectar Pharyngeal - Nectar Cup: Reduced pharyngeal peristalsis;Reduced epiglottic inversion;Reduced anterior laryngeal mobility;Reduced laryngeal elevation;Reduced airway/laryngeal closure;Penetration/Aspiration during swallow;Trace aspiration;Pharyngeal residue - posterior pharnyx;Pharyngeal residue - pyriform;Pharyngeal residue - valleculae;Pharyngeal residue - cp segment;Compensatory strategies attempted (Comment) (chin tuck not effective in preventing aspiration) Penetration/Aspiration details (nectar cup): Material enters airway, passes BELOW cords and not ejected out despite cough attempt by patient Pharyngeal - Thin Pharyngeal - Thin Cup: Reduced pharyngeal peristalsis;Reduced epiglottic inversion;Reduced anterior laryngeal mobility;Reduced laryngeal elevation;Reduced airway/laryngeal closure;Penetration/Aspiration before swallow;Penetration/Aspiration during swallow;Trace aspiration;Pharyngeal residue - pyriform;Pharyngeal residue - posterior pharnyx;Pharyngeal residue - valleculae;Pharyngeal residue - cp segment Penetration/Aspiration details (thin cup): Material enters airway, passes BELOW cords and not ejected out despite cough attempt by patient;Material enters airway, remains ABOVE vocal cords and not ejected out Pharyngeal - Solids Pharyngeal - Puree: Reduced pharyngeal peristalsis;Reduced airway/laryngeal closure;Reduced epiglottic inversion;Reduced anterior laryngeal mobility;Reduced laryngeal elevation;Pharyngeal residue - valleculae;Pharyngeal residue - posterior pharnyx Pharyngeal - Mechanical Soft: Reduced airway/laryngeal closure;Reduced tongue base retraction;Reduced epiglottic inversion;Reduced anterior laryngeal mobility;Reduced laryngeal elevation;Pharyngeal residue - posterior pharnyx;Pharyngeal residue - pyriform;Pharyngeal residue -  cp segment Cervical Esophageal Phase  Cervical Esophageal Phase - Solids Mechanical Soft: Esophageal backflow into cervical esophagus   Moreen Fowler M.S., CCC-SLP [phone]  [facility] 02/02/2012, 7:20 PM

## 2012-02-02 NOTE — Progress Notes (Signed)
Patient ambulated in the hallway 150 feet. Patient tolerated well. Returned to chair. Will continue to monitor closely. Lajuana Matte, RN

## 2012-02-02 NOTE — Progress Notes (Addendum)
Subjective:   Douglas [name] complains of fatigue and shortness of breath this morning.  He underwent PPM placement yesterday  Objective:  Vital Signs in the last 24 hours: Temp:  [98 F (36.7 C)-98.3 F (36.8 C)] 98 F (36.7 C) (04/13 0358) Pulse Rate:  [70-88] 71  (04/13 0358) Resp:  [20] 20  (04/13 0358) BP: (122-145)/(60-78) 138/78 mmHg (04/13 0358) SpO2:  [92 %-95 %] 93 % (04/13 0358) Weight:  [238 lb 1.6 oz (108 kg)] 238 lb 1.6 oz (108 kg) (04/13 0358)  Intake/Output from previous day: 04/12 0701 - 04/13 0700 In: 3 [I.V.:3] Out: 725 [Urine:725] Intake/Output from this shift:    Physical Exam: General appearance: alert, cooperative and no distress Lungs: clear to auscultation bilaterally Heart: regular rate and rhythm Abdomen: soft, non-tender; bowel sounds normal; no masses,  no organomegaly Extremities: edema 1+ Skin: incisions C/D/I  Lab Results:  Basename 02/02/12 0500 02/01/12 0514  WBC 17.9* 18.1*  HGB 9.5* 9.2*  PLT 248 239    Basename 02/02/12 0500 02/01/12 0514  NA 146* 146*  K 3.7 3.7  CL 110 110  CO2 25 25  GLUCOSE 139* 125*  BUN 38* 38*  CREATININE 1.18 1.02   No results found for this basename: TROPONINI:2,CK,MB:2 in the last 72 hours Hepatic Function Panel No results found for this basename: PROT,ALBUMIN,AST,ALT,ALKPHOS,BILITOT,BILIDIR,IBILI in the last 72 hours No results found for this basename: CHOL in the last 72 hours No results found for this basename: PROTIME in the last 72 hours  Imaging: Imaging results have been reviewed: small posterior left pleural effusion   Assessment/Plan:   1. S/P Bentall Procedure, CABG 2. Complete Heart Block-PPM placed yesterday 3. CV- A. Fib with V Pacing- EP following, will d/c EPW, start low dose coumadin  4. Fluid Balance- patient is at preop weight, pitting edema present in LE, will continue low dose diuresis 5. Leukocytosis- stable, no evidence of acute infection 6. Renal- mildly elevated  creatinine will follow 7. Dysphagia- following recommendations per Speech/Swallow 8. Dispo- patient is stable, if continues to improve likely d/c home Mon or Tuesday  LOS: 9 days    BARRETT, ERIN 02/02/2012, 8:38 AM    Resume po lasix now that BUN is better Now that PPM placed will start low dose B-blocker

## 2012-02-02 NOTE — Progress Notes (Signed)
CARDIAC REHAB PHASE I   PRE:  Rate/Rhythm: 70 paced  BP:  Supine: 154/71  Sitting:   Standing:    SaO2: 97%RA  MODE:  Ambulation: 350 ft   POST:  Rate/Rhythem: 73 paced  BP:  Supine:   Sitting: 121/47  Standing:    SaO2: 96%RA 1415-1455 Pt walked 350 ft on RA with rolling walker and asst x 2. Followed with chair and pt sat down twice to rest. To recliner after walk. Family in room. Tolerated well but tired at end of walk.  Duanne Limerick

## 2012-02-02 NOTE — Progress Notes (Signed)
SUBJECTIVE: The patient is doing well today.  At this time, he denies chest pain, shortness of breath, or any new concerns.     Marland Kitchen  ceFAZolin (ANCEF) IV  1 g Intravenous Q6H  .  ceFAZolin (ANCEF) IV  2 g Intravenous On Call  . chlorhexidine  60 mL Topical Once  . feeding supplement  1 Container Oral BID BM  . fentaNYL      . furosemide  40 mg Oral Daily  . guaiFENesin  600 mg Oral BID  . heparin      . iron polysaccharides  150 mg Oral Daily  . lidocaine      . losartan  25 mg Oral Daily  . midazolam      . pantoprazole  40 mg Oral Q1200  . potassium chloride  20 mEq Oral Daily  . sodium chloride  3 mL Intravenous Q12H  . DISCONTD: gentamicin irrigation  80 mg Irrigation On Call      . DISCONTD: sodium chloride 50 mL/hr at 02/01/12 1030  . DISCONTD: sodium chloride      OBJECTIVE: Physical Exam: Filed Vitals:   02/01/12 1236 02/01/12 1300 02/01/12 2026 02/02/12 0358  BP: 133/63 145/60 122/65 138/78  Pulse: 88 70 71 71  Temp: 98.3 F (36.8 C)  98.3 F (36.8 C) 98 F (36.7 C)  TempSrc: Oral  Oral Oral  Resp: 20  20 20   Height:      Weight:    238 lb 1.6 oz (108 kg)  SpO2: 92%  95% 93%    Intake/Output Summary (Last 24 hours) at 02/02/12 0756 Last data filed at 02/01/12 2200  Gross per 24 hour  Intake      3 ml  Output    725 ml  Net   -722 ml    Telemetry reveals afib, V paced  GEN- The patient is well appearing, alert and oriented x 3 today.   Head- normocephalic, atraumatic Eyes-  Sclera clear, conjunctiva pink Ears- hearing intact Oropharynx- clear Lungs- Clear to ausculation bilaterally, normal work of breathing Heart- Regular rate and rhythm (paced) GI- soft, NT, ND, + BS Extremities- no clubbing, cyanosis, + dependant edema Skin- pacemaker site without hematoma  LABS: Basic Metabolic Panel:  Basename 02/02/12 0500 02/01/12 0514  NA 146* 146*  K 3.7 3.7  CL 110 110  CO2 25 25  GLUCOSE 139* 125*  BUN 38* 38*  CREATININE 1.18 1.02  CALCIUM  8.7 8.7  MG -- --  PHOS -- --   Liver Function Tests: No results found for this basename: AST:2,ALT:2,ALKPHOS:2,BILITOT:2,PROT:2,ALBUMIN:2 in the last 72 hours No results found for this basename: LIPASE:2,AMYLASE:2 in the last 72 hours CBC:  Basename 02/02/12 0500 02/01/12 0514  WBC 17.9* 18.1*  NEUTROABS -- --  HGB 9.5* 9.2*  HCT 30.5* 29.0*  MCV 99.3 98.3  PLT 248 239    CXR this am is pending PPM interrogation today is reviewed on paper chart and is normal   ASSESSMENT AND PLAN:  Active Problems:  CHB (complete heart block)  1. CHB- doing well s/p PPM Keep incision clean and dry Follow-up in device clinic in 10 days for a wound check  2. Afib- appears to have been in afib post operatively and remains in afib I recommend anticoagulation if not contraindicated when felt to be reasonable by Dr Donata Clay.  Will see as needed over the weekend.  Hillis Range, MD 02/02/2012 7:56 AM

## 2012-02-02 NOTE — Progress Notes (Signed)
HR 70, BP 115/57, O2 98% RA. DC'd pacing wires per MD order and per hospital protocol. Pacing wires intact upon removal. Patient tolerated well, will continue to monitor. Patient reminded to stay in bed for an hour. Lajuana Matte, RN

## 2012-02-03 LAB — URINE CULTURE
Colony Count: NO GROWTH
Culture  Setup Time: 201304122233
Culture: NO GROWTH

## 2012-02-03 LAB — PROTIME-INR
INR: 1.36 (ref 0.00–1.49)
Prothrombin Time: 17 seconds — ABNORMAL HIGH (ref 11.6–15.2)

## 2012-02-03 MED ORDER — ENSURE COMPLETE PO LIQD
237.0000 mL | Freq: Three times a day (TID) | ORAL | Status: DC
  Administered 2012-02-03 – 2012-02-05 (×3): 237 mL via ORAL

## 2012-02-03 MED ORDER — ENSURE PO LIQD: 237.0000 mL | Freq: Three times a day (TID) | ORAL | Status: DC

## 2012-02-03 MED ORDER — AMLODIPINE BESYLATE 5 MG PO TABS
5.0000 mg | ORAL_TABLET | Freq: Every day | ORAL | Status: DC
  Administered 2012-02-04 – 2012-02-06 (×3): 5 mg via ORAL
  Filled 2012-02-03 (×4): qty 1

## 2012-02-03 MED ORDER — NIFEDIPINE ER 30 MG PO TB24: 30.0000 mg | ORAL_TABLET | Freq: Every day | ORAL | Status: DC

## 2012-02-03 MED ORDER — CHLORHEXIDINE GLUCONATE 0.12 % MT SOLN
10.0000 mL | Freq: Two times a day (BID) | OROMUCOSAL | Status: DC
  Administered 2012-02-03: 10 mL via OROMUCOSAL
  Administered 2012-02-03 – 2012-02-04 (×2): via OROMUCOSAL
  Administered 2012-02-04 – 2012-02-06 (×4): 10 mL via OROMUCOSAL
  Filled 2012-02-03 (×8): qty 15

## 2012-02-03 MED ORDER — AMLODIPINE BESYLATE 5 MG PO TABS
5.0000 mg | ORAL_TABLET | Freq: Every day | ORAL | Status: DC
  Administered 2012-02-03: 5 mg via ORAL
  Filled 2012-02-03: qty 1

## 2012-02-03 NOTE — Progress Notes (Signed)
Patient ambulated in the hallway with a RW for 150 feet. Patient tolerated well, will continue to monitor closely

## 2012-02-03 NOTE — Progress Notes (Addendum)
Subjective:   Douglas [name] feels better than yesterday.  He states he does not feel as tired as yesterday.  He continues to have difficulty with his swallowing.  He states that he feels like he is just washing his food down.  He also states he is not able to eat all of the food he is being provided.  I explained to the patient that he should eat what he feels up to, but he does not have to eat everything provided.   Objective:  Vital Signs in the last 24 hours: Temp:  [98 F (36.7 C)-98.3 F (36.8 C)] 98 F (36.7 C) (04/14 0525) Pulse Rate:  [71-92] 92  (04/14 0525) Resp:  [20-22] 20  (04/14 0525) BP: (115-130)/(47-74) 130/74 mmHg (04/14 0525) SpO2:  [92 %-98 %] 92 % (04/14 0525) Weight:  [237 lb 3.4 oz (107.6 kg)] 237 lb 3.4 oz (107.6 kg) (04/14 0525)  Intake/Output from previous day: 04/13 0701 - 04/14 0700 In: 0  Out: 850 [Urine:850] Intake/Output from this shift:    Physical Exam: General appearance: alert, cooperative and no distress Lungs: clear to auscultation bilaterally Heart: regular rate and rhythm and paced Abdomen: soft, non-tender; bowel sounds normal; no masses,  no organomegaly Extremities: edema 1+ Skin: incisions are clean and dry  Lab Results:  Basename 02/02/12 0500 02/01/12 0514  WBC 17.9* 18.1*  HGB 9.5* 9.2*  PLT 248 239    Basename 02/02/12 0500 02/01/12 0514  NA 146* 146*  K 3.7 3.7  CL 110 110  CO2 25 25  GLUCOSE 139* 125*  BUN 38* 38*  CREATININE 1.18 1.02   No results found for this basename: TROPONINI:2,CK,MB:2 in the last 72 hours Hepatic Function Panel No results found for this basename: PROT,ALBUMIN,AST,ALT,ALKPHOS,BILITOT,BILIDIR,IBILI in the last 72 hours No results found for this basename: CHOL in the last 72 hours No results found for this basename: PROTIME in the last 72 hours  Assessment/Plan:   1. S/P Bentall procedure/CABG 2. Complete Heart Block- S/P PPM placement 3. CV- patient in paced rhythm/ A. Fib, patient currently  on metoprolol and cozaar, placed on Coumadin yesterday INR 1.36, will continue coumadin 2.5mg  4. Fluid Balance- patient has reached preop weight, will continue low dose diuresis for LE edema 5. Dysphagia- patient evaluated by S/S yesterday with no improvement from original study.  They recommended patient stay on current diet regimen.  6. Dispo- patients swallowing issues were present prior to surgery, however his aspiration problems should improve with some time, per Dr. Donata Clay will start patient on a Calcium Channel blocker to aid with dysmotility.  He is stable from a cardiac standpoint and will likely be ready for d/c early next week    LOS: 10 days    BARRETT, ERIN 02/03/2012, 8:44 AM   Barium swallow-esophogram shows sig dysmotility w/ tertiary contractions most certainly a chronic problem with this patient. Will start low dose calcium channel blocker nifedipine xL 30mg   Repeat  MBS by SLT - hopefully he can go home on thin liqs

## 2012-02-04 DIAGNOSIS — I319 Disease of pericardium, unspecified: Secondary | ICD-10-CM

## 2012-02-04 LAB — PROTIME-INR: Prothrombin Time: 17 seconds — ABNORMAL HIGH (ref 11.6–15.2)

## 2012-02-04 NOTE — Progress Notes (Signed)
Physical Therapy Treatment Patient Details Name: Douglas [name]. MRN: [id number] DOB: Feb 24, 1928 Today's Date: 02/04/2012  PT Assessment/Plan  PT - Assessment/Plan Comments on Treatment Session: Pt ambulated well today.  Pt continues to need cuing to maintain sternal precautions with transfers from sit>stand.  Pt continues to required verbal and manual cues to look straight ahead and to stand closer to RW. PT Plan: Discharge plan remains appropriate;Frequency remains appropriate PT Frequency: Min 3X/week Follow Up Recommendations: Home health PT;Supervision/Assistance - 24 hour Equipment Recommended: Rolling walker with 5" wheels PT Goals  Acute Rehab PT Goals PT Goal Formulation: With patient Time For Goal Achievement: 7 days Pt will go Sit to Stand: with modified independence PT Goal: Sit to Stand - Progress: Progressing toward goal Pt will go Stand to Sit: with modified independence PT Goal: Stand to Sit - Progress: Progressing toward goal Pt will Ambulate: >150 feet;with modified independence;with least restrictive assistive device PT Goal: Ambulate - Progress: Progressing toward goal  PT Treatment Precautions/Restrictions  Precautions Precautions: Sternal;Fall Required Braces or Orthoses DO NOT USE: No Restrictions Weight Bearing Restrictions: No RUE Weight Bearing:  (sternal precautions) LUE Weight Bearing:  (sternal precautions) Other Position/Activity Restrictions: Sternal precautions Mobility (including Balance) Bed Mobility Bed Mobility: No Transfers Transfers: Yes Sit to Stand: 4: Min assist;From chair/3-in-1;Without upper extremity assist Sit to Stand Details (indicate cue type and reason): Pt required min assist for forward translation of his trunk and VC for hand placement on his knees to maintain sternal precautions. Stand to Sit: 4: Min assist;To chair/3-in-1;Without upper extremity assist Stand to Sit Details: Pt required min assist to control descent into  chair.  Pt placed hands on knees to maintain sternal precautions without cuing. Ambulation/Gait Ambulation/Gait: Yes Ambulation/Gait Assistance: 4: Min assist Ambulation/Gait Assistance Details (indicate cue type and reason): Pt required min assist to maintain balance with constant verbal and manual cues to look straight ahead and walk closer to the RW. Ambulation Distance (Feet): 200 Feet Assistive device: Rolling walker Gait Pattern: Decreased stride length;Trunk flexed Gait velocity: Decreased Stairs: No Wheelchair Mobility Wheelchair Mobility: No  Posture/Postural Control Posture/Postural Control: No significant limitations Balance Balance Assessed: No Exercise    End of Session PT - End of Session Equipment Utilized During Treatment: Gait belt Activity Tolerance: Patient tolerated treatment well Patient left: in chair;with call bell in reach General Behavior During Session: Flat affect Cognition: WFL for tasks performed  Ezzard Standing SPT 02/04/2012, 12:01 PM

## 2012-02-04 NOTE — Progress Notes (Addendum)
[address]            [place] [address]          [phone]     3 Days Post-Op  Procedure(s) (LRB): PERMANENT PACEMAKER INSERTION (N/A) Subjective: Feeling better, continues to get stronger slowly  Objective  Telemetry Vpaced  Temp:  [97.6 F (36.4 C)-98.1 F (36.7 C)] 98 F (36.7 C) (04/15 0557) Pulse Rate:  [70-73] 70  (04/15 0557) Resp:  [20] 20  (04/15 0557) BP: (94-120)/(53-68) 105/65 mmHg (04/15 0557) SpO2:  [91 %-96 %] 91 % (04/15 0557) Weight:  [240 lb 15.4 oz (109.3 kg)] 240 lb 15.4 oz (109.3 kg) (04/15 0557)   Intake/Output Summary (Last 24 hours) at 02/04/12 0802 Last data filed at 02/04/12 0600  Gross per 24 hour  Intake    840 ml  Output    850 ml  Net    -10 ml       General appearance: alert, cooperative and no distress Heart: regular rate and rhythm and S1, S2 normal Lungs: diminished left base , tubular Abdomen: soft, nontender Extremities: nonpitting edema Wound: incisions healing well  Lab Results:  Basename 02/02/12 0500  NA 146*  K 3.7  CL 110  CO2 25  GLUCOSE 139*  BUN 38*  CREATININE 1.18  CALCIUM 8.7  MG --  PHOS --   No results found for this basename: AST:2,ALT:2,ALKPHOS:2,BILITOT:2,PROT:2,ALBUMIN:2 in the last 72 hours No results found for this basename: LIPASE:2,AMYLASE:2 in the last 72 hours  Basename 02/02/12 0500  WBC 17.9*  NEUTROABS --  HGB 9.5*  HCT 30.5*  MCV 99.3  PLT 248   No results found for this basename: CKTOTAL:4,CKMB:4,TROPONINI:4 in the last 72 hours No components found with this basename: POCBNP:3 No results found for this basename: DDIMER in the last 72 hours No results found for this basename: HGBA1C in the last 72 hours No results found for this basename: CHOL,HDL,LDLCALC,TRIG,CHOLHDL in the last 72 hours No results found for this basename: TSH,T4TOTAL,FREET3,T3FREE,THYROIDAB in the last 72 hours No results found for this basename:  VITAMINB12,FOLATE,FERRITIN,TIBC,IRON,RETICCTPCT in the last 72 hours  Medications: Scheduled    . amLODipine  5 mg Oral Daily  . chlorhexidine  10 mL Mouth/Throat BID  . feeding supplement  237 mL Oral TID WC  . furosemide  40 mg Oral Daily  . losartan  25 mg Oral Daily  . metoprolol tartrate  12.5 mg Oral BID  . pantoprazole  40 mg Oral Q1200  . potassium chloride  20 mEq Oral Daily  . sodium chloride  3 mL Intravenous Q12H  . warfarin  2.5 mg Oral q1800  . Warfarin - Physician Dosing Inpatient   Does not apply q1800  . DISCONTD: amLODipine  5 mg Oral Daily  . DISCONTD: ENSURE  237 mL Oral TID WC  . DISCONTD: feeding supplement  1 Container Oral BID BM  . DISCONTD: ferrous sulfate  300 mg Oral Daily  . DISCONTD: guaiFENesin  300 mg Oral QID  . DISCONTD: NIFEdipine  30 mg Oral Daily     Radiology/Studies:  Dg Chest 2 View  02/02/2012  *RADIOLOGY REPORT*  Clinical Data: Pacemaker placement.  CHEST - 2 VIEW  Comparison: 01/29/2012  Findings: Cardiomegaly noted with moderate left and small right pleural effusion.  No pulmonary edema is observed.  Associated passive atelectasis noted.  Aortic valve prosthesis noted.  Prior  CABG noted.  Dual lead pacer has been placed, with proximal and distal leads projecting over the right atrium and ventricle, respectively.  Right-sided PICC line remains in place.  The right IJ line has been removed.  Thoracic spondylosis noted.  IMPRESSION:  1.  Interval placement of a dual lead pacer, with proximal and distal lead tips projecting over the right atrium and ventricle, respectively. 2.  Moderate left and small right pleural effusions with associated passive atelectasis. 3.  Cardiomegaly, without overt edema.  Original Report Authenticated By: Dellia Cloud, M.D.    INR:1.36 Will add last result for INR, ABG once components are confirmed Will add last 4 CBG results once components are confirmed  Assessment/Plan: S/P Procedure(s)  (LRB): PERMANENT PACEMAKER INSERTION (N/A)  1. To have MBS by SLT in future 2. Cont coumadin 3. Cont current rhythm management 4. pulm toilet/rehab 5. Gentle diuresis  LOS: 11 days    GOLD,WAYNE E 4/15/20138:02 AM Getting stronger- TTE today shows EF .60 and small pericardial effusion   Needs home PT, home health RN and INR draw to  coumadin clinic 48hrs post DC.  Needs repeat SLT swallow eval prior to DC- the study over the weekend was a formal esophogram by the radiologist demonstrating dysmotility but no stricture.

## 2012-02-04 NOTE — Progress Notes (Signed)
Camera Krienke, PT [phone]  

## 2012-02-04 NOTE — Progress Notes (Signed)
CARDIAC REHAB PHASE I   PRE:  Rate/Rhythm: 70paced  BP:  Supine:   Sitting: 113/54  Standing:    SaO2: 100%ra  MODE:  Ambulation: 425 ft   POST:  Rate/Rhythem: 71paced  BP:  Supine:   Sitting: 98/47  Standing:    SaO2: 98%RA 1350-1425 Pt walked 425 ft on RA with rolling walker and asst x 2. Followed with chair and pt sat twice to rest. Tolerated well. Back to recliner with call bell.  Douglas [name]

## 2012-02-04 NOTE — Progress Notes (Signed)
   CARE MANAGEMENT NOTE 02/04/2012  Patient:  Douglas [name], Douglas [name]   Account Number:  [id number]  Date Initiated:  01/28/2012  Documentation initiated by:  Grantley Savage  Subjective/Objective Assessment:   PT S/P CABG X 4 ON 01/24/12.  PTA, PT INDEPENDENT, LIVES WITH WIFE.     Action/Plan:   MET WITH PT TO DISCUSS DC PLANS.   HE STATES SPOUSE WILL PROVIDE 24HR CARE AT DISCHARGE.  HE HAS RW AT HOME, IF NEEDED.   Anticipated DC Date:  01/31/2012   Anticipated DC Plan:  HOME W HOME HEALTH SERVICES      DC Planning Services  CM consult      Choice offered to / List presented to:             Status of service:  In process, will continue to follow Medicare Important Message given?   (If response is "NO", the following Medicare IM given date fields will be blank) Date Medicare IM given:   Date Additional Medicare IM given:    Discharge Disposition:    Per UR Regulation:    If discussed at Long Length of Stay Meetings, dates discussed:    Comments:  02/04/12 Yaman Grauberger,RN,BSN 1410 PT WILL NEED HOME HEALTH FOLLOW UP AT DC.  PT GIVEN [place] PROVIDER LIST TO REVIEW WITH WIFE.  WILL FOLLOW UP IN AM FOR PT CHOICE. Phone #[phone]   02/01/12 Vikrant Pryce,RN,BSN 1153 PT TO HAVE PERMANENT PACEMAKER PLACED TODAY FOR COMPLETE HEART BLOCK.  WILL CONT TO FOLLOW POST PROCEDURE. Phone #[phone]   01/28/12 Freddie Dymek,RN,BSN 1539 WILL CONT TO FOLLOW PT FOR HOME NEEDS AS HE PROGRESSES. Phone #[phone]

## 2012-02-05 ENCOUNTER — Inpatient Hospital Stay (HOSPITAL_COMMUNITY): Payer: Medicare Other

## 2012-02-05 LAB — BASIC METABOLIC PANEL
BUN: 39 mg/dL — ABNORMAL HIGH (ref 6–23)
CO2: 25 mEq/L (ref 19–32)
Calcium: 8.4 mg/dL (ref 8.4–10.5)
Chloride: 101 mEq/L (ref 96–112)
Creatinine, Ser: 1.23 mg/dL (ref 0.50–1.35)
GFR calc Af Amer: 61 mL/min — ABNORMAL LOW (ref >90)
GFR calc non Af Amer: 53 mL/min — ABNORMAL LOW (ref >90)
Glucose, Bld: 110 mg/dL — ABNORMAL HIGH (ref 70–99)
Potassium: 3.4 mEq/L — ABNORMAL LOW (ref 3.5–5.1)
Sodium: 136 mEq/L (ref 135–145)

## 2012-02-05 LAB — PROTIME-INR
INR: 1.52 — ABNORMAL HIGH (ref 0.00–1.49)
Prothrombin Time: 18.6 seconds — ABNORMAL HIGH (ref 11.6–15.2)

## 2012-02-05 MED ORDER — METOPROLOL TARTRATE 12.5 MG HALF TABLET
12.5000 mg | ORAL_TABLET | Freq: Two times a day (BID) | ORAL | Status: DC
Start: 2012-02-05 — End: 2012-02-06
  Administered 2012-02-05: 12.5 mg via ORAL
  Filled 2012-02-05: qty 1

## 2012-02-05 MED ORDER — WARFARIN SODIUM 2.5 MG PO TABS: 2.5000 mg | ORAL_TABLET | Freq: Every day | ORAL | Status: DC

## 2012-02-05 MED ORDER — POTASSIUM CHLORIDE CRYS ER 20 MEQ PO TBCR
20.0000 meq | EXTENDED_RELEASE_TABLET | Freq: Once | ORAL | Status: AC
  Administered 2012-02-05: 20 meq via ORAL
  Filled 2012-02-05: qty 1

## 2012-02-05 MED ORDER — POTASSIUM CHLORIDE CRYS ER 20 MEQ PO TBCR: 20.0000 meq | EXTENDED_RELEASE_TABLET | Freq: Two times a day (BID) | ORAL | Status: DC

## 2012-02-05 MED ORDER — FUROSEMIDE 40 MG PO TABS: 40.0000 mg | ORAL_TABLET | Freq: Two times a day (BID) | ORAL | Status: DC

## 2012-02-05 MED ORDER — LOSARTAN POTASSIUM 25 MG PO TABS: 100.0000 mg | ORAL_TABLET | Freq: Every day | ORAL | Status: DC

## 2012-02-05 MED ORDER — TRAMADOL HCL 50 MG PO TABS: 50.0000 mg | ORAL_TABLET | Freq: Four times a day (QID) | ORAL | Status: AC | PRN

## 2012-02-05 MED ORDER — FUROSEMIDE 40 MG PO TABS
40.0000 mg | ORAL_TABLET | Freq: Every day | ORAL | Status: DC
  Administered 2012-02-05: 40 mg via ORAL
  Filled 2012-02-05: qty 1

## 2012-02-05 MED ORDER — ENSURE COMPLETE PO LIQD: 237.0000 mL | Freq: Three times a day (TID) | ORAL | Status: DC

## 2012-02-05 MED ORDER — POTASSIUM CHLORIDE CRYS ER 20 MEQ PO TBCR
40.0000 meq | EXTENDED_RELEASE_TABLET | Freq: Once | ORAL | Status: AC
  Administered 2012-02-05: 40 meq via ORAL
  Filled 2012-02-05: qty 2

## 2012-02-05 MED ORDER — FUROSEMIDE 40 MG PO TABS
40.0000 mg | ORAL_TABLET | Freq: Two times a day (BID) | ORAL | Status: DC
  Administered 2012-02-05 – 2012-02-06 (×2): 40 mg via ORAL
  Filled 2012-02-05 (×4): qty 1

## 2012-02-05 MED ORDER — POTASSIUM CHLORIDE CRYS ER 20 MEQ PO TBCR
20.0000 meq | EXTENDED_RELEASE_TABLET | Freq: Two times a day (BID) | ORAL | Status: DC
  Administered 2012-02-05 – 2012-02-06 (×2): 20 meq via ORAL
  Filled 2012-02-05 (×3): qty 1

## 2012-02-05 MED ORDER — LOSARTAN POTASSIUM 25 MG PO TABS: 25.0000 mg | ORAL_TABLET | Freq: Every day | ORAL | Status: DC

## 2012-02-05 NOTE — Progress Notes (Signed)
   ELECTROPHYSIOLOGY ROUNDING NOTE    Patient Name: Douglas [name]. Date of Encounter: 02-05-2012    SUBJECTIVE: Feels better, minimal soreness at pacer site.  BLE edema persists  TELEMETRY: Reviewed telemetry pt in atrial fib with ventricular pacing.   Filed Vitals:   02/04/12 1112 02/04/12 1700 02/04/12 2026 02/05/12 0520  BP: 126/56 102/47 100/54 105/60  Pulse: 70 70 70 70  Temp:   97.4 F (36.3 C) 98.2 F (36.8 C)  TempSrc:   Oral Oral  Resp:   20 19  Height:      Weight:    241 lb (109.317 kg)  SpO2:  96% 95% 91%    Intake/Output Summary (Last 24 hours) at 02/05/12 0850 Last data filed at 02/05/12 0521  Gross per 24 hour  Intake      0 ml  Output    750 ml  Net   -750 ml    LABS: Basic Metabolic Panel:  Basename 02/05/12 0600  NA 136  K 3.4*  CL 101  CO2 25  GLUCOSE 110*  BUN 39*  CREATININE 1.23  CALCIUM 8.4  MG --  PHOS --   INR: 1.52  PHYSICAL EXAM Left chest without hematoma, Tegaderm still in place NAD, OP clear, JVP 10cm, few basilar rales, RRR (Paced), S/NT/ND +BS, +2 BLE edema  Active Problems:  CHB (complete heart block)   1. CHB- doing well s/p PPM  Wound check appointment scheduled for 02-14-12 at 4PM in [facility] office (no appointments available in [place] next week), will plan to follow pacer long term in [place] office with Dr Ladona Ridgel.  Pt should not get pacemaker incision wet until 02-08-12.  He should not lift more than 10 pounds with left arm for 4 weeks.  He should not lift left arm over head for 7 days post implant.    2. Afib- consider cardioversion in 6-8 weeks once INR has been therapeutic x at least 4 weeks. I will defer to Dr Diona Browner  3.  CHF- remains volume overloaded Will increase lasix to 40mg  BID x 1 week Replete K   02/21/12 at 2:20 in [place]

## 2012-02-05 NOTE — Procedures (Signed)
Objective Swallowing Evaluation: Modified Barium Swallowing Study  Patient Details  Name: Douglas [name]. MRN: [id number] Date of Birth: September 28, 1928  Today's Date: 02/05/2012 Time:  -     Past Medical History:  Past Medical History  Diagnosis Date  . Dysphagia   . Essential hypertension, benign   . Hyperkalemia   . DJD (degenerative joint disease)   . PMR (polymyalgia rheumatica)   . Gout   . Arthritis   . Aortic sclerosis   . Hx of adenomatous colonic polyps   . Aneurysm of thoracic aorta     ascending. 5.7 x 5.5cm  . Difficult intubation     stated difficulty swallowing, and feel like something throat  . Coronary artery disease   . H/O hiatal hernia   . Esophageal dysmotility   . Fibromyalgia     tingling in legs and feet   Past Surgical History:  Past Surgical History  Procedure Date  . Bilateral knee arthroscopy   . Appendectomy   . Hemorroidectomy   . Left inguinal hernia repair   . Hernia repair   . Back surgery     1965 or 1966 lumbar back surgery  . Bentall procedure 01/24/2012    Procedure: BENTALL PROCEDURE;  Surgeon: Kerin Perna, MD;  [facility] OR;  Service: Open Heart Surgery;  Laterality: N/A;  . Coronary artery bypass graft 01/24/2012    Procedure: CORONARY ARTERY BYPASS GRAFTING (CABG);  Surgeon: Kerin Perna, MD;  Location: [facility] OR;  Service: Open Heart Surgery;  Laterality: N/A;   HPI:  Patient is an 78 year old male with diagnosis of AS s/p CABG on 4/5. Intubated for surgery only. Patient with PMH of esophageal dysmotility and GERD however is complaining of new onset coughing and regurgitation with pos s/p surgery which is worsening in degree. Pt has undergone two prior MBS indicating need for Dysphagia 2 diet and pudding thick liquids. Repeat MBS prior to d/c home.      Recommendation/Prognosis  Clinical Impression Dysphagia Diagnosis: Moderate pharyngeal phase dysphagia;Mild oral phase dysphagia;Mild cervical esophageal phase  dysphagia Clinical impression: Pt with minimal improvement from last MBS. Pt continues to expereince reduced airway protection during the swallow due to appearance of cervical osteophyte blocking epiglottic deflection. Suspect this is pts baseline though generalized weakness results in reduced pharyngeal contraction, laryngeal elevation for opening of UES causing more residuals and decreased compensation for mechanical dysphagia. At this time the pt is recommended to initiate a Dys 3 diet (mechanical soft) with nectar thick liquids. Pt must strictly follow compensatory strategies: throat clear and second swallow. Even in best conditions pt will experience some aspiration which he will hopefully tolerate to some degree. Recommend home health SLP.  Swallow Evaluation Recommendations Diet Recommendations: Dysphagia 3 (Mechanical Soft);Nectar-thick liquid Liquid Administration via: Cup;Straw Medication Administration: Crushed with puree Supervision: Patient able to self feed;Full supervision/cueing for compensatory strategies Compensations: Slow rate;Multiple dry swallows after each bite/sip;Clear throat intermittently;Effortful swallow Postural Changes and/or Swallow Maneuvers: Out of bed for meals;Seated upright 90 degrees;Upright 30-60 min after meal Oral Care Recommendations: Oral care before and after PO Other Recommendations: Order thickener from pharmacy;Prohibited food (jello, ice cream, thin soups);Clarify dietary restrictions;Remove water pitcher;Have oral suction available Follow up Recommendations: Home health SLP Prognosis Prognosis for Safe Diet Advancement: Guarded Barriers to Reach Goals: Severity of dysphagia Individuals Consulted Consulted and Agree with Results and Recommendations: Patient Family Member Consulted: granddaughter  SLP Assessment/Plan Dysphagia Diagnosis: Moderate pharyngeal phase dysphagia;Mild oral phase dysphagia;Mild cervical esophageal  phase dysphagia Clinical  impression: Pt with minimal improvement from last MBS. Pt continues to expereince reduced airway protection during the swallow due to appearance of cervical osteophyte blocking epiglottic deflection. Suspect this is pts baseline though generalized weakness results in reduced pharyngeal contraction, laryngeal elevation for opening of UES causing more residuals and decreased compensation for mechanical dysphagia. At this time the pt is recommended to initiate a Dys 3 diet (mechanical soft) with nectar thick liquids. Pt must strictly follow compensatory strategies: throat clear and second swallow. Even in best conditions pt will experience some aspiration which he will hopefully tolerate to some degree. Recommend home health SLP.   SLP Goals  SLP Swallowing Goals Patient will consume recommended diet without observed clinical signs of aspiration with: Modified independent assistance Swallow Study Goal #1 - Progress: Progressing toward goal Patient will utilize recommended strategies during swallow to increase swallowing safety with: Modified independent assistance Swallow Study Goal #2 - Progress: Progressing toward goal  [facility], MA CCC-SLP [phone]  Claudine Mouton 02/05/2012, 10:18 AM

## 2012-02-05 NOTE — Progress Notes (Signed)
CARDIAC REHAB PHASE I   PRE:  Rate/Rhythm: 70 paced   BP:  Supine:   Sitting: 102/52  Standing:    SaO2: 99%RA  MODE:  Ambulation: 300 ft   POST:  Rate/Rhythem: 73  BP:  Supine:   Sitting: 101/46  Standing:    SaO2: 95%RA 1140-1200 Pt walked 300 ft on RA with rolling walker and asst x 1. Stopped several times to take a rest break but did not sit. Pt stated tired from earlier trips to Xray but did well with walk. Tired by end of walk. To recliner with call bell.  Douglas [name]

## 2012-02-05 NOTE — Discharge Summary (Addendum)
Physician Discharge Summary  Patient ID: Douglas [name]. MRN: [id number] DOB/AGE: [birth year] 78 y.o.  Admit date: 01/24/2012 Discharge date: 02/06/2012  Admission Diagnoses: 1.Multivessel CAD 2. Enlarging ascending thoracic aneurysm measuring 6.2 cm.  3. Three-vessel coronary artery disease.  4. Moderate aortic stenosis, calcified bicuspid valve.  5.Complete heart block 6.History of hypertension 7.History of esophageal dysmotility and dysphagia 8.History of gout 9.History of DJD  Discharge Diagnoses:  1.Multivessel CAD 2. Enlarging ascending thoracic aneurysm measuring 6.2 cm.  3. Three-vessel coronary artery disease.  4. Moderate aortic stenosis, calcified bicuspid valve.  5.Complete heart block 6.History of hypertension 7.History of esophageal dysmotility and dysphagia 8.History of gout 9.History of DJD 10.ABL anemia 11. Post op afib 12.Leukocytosis  Procedure (s):  1. Bentall procedure - biologic valve - conduit, aortic root  replacement for an ascending aortic aneurysm using a 23-mm Edwards  pericardial valve, reimplantation of the coronary arteries, and  replacement of the ascending aorta using a 28-mm Gelweave Valsalva  graft. 2. Coronary artery bypass grafting x3 (left internal mammary artery to  left anterior descending artery, saphenous vein graft to obtuse marginal, saphenous vein graft to posterior descending). 3. Endoscopic harvest of right leg greater saphenous vein.  4. Placement of left femoral A-line by Dr. Donata Clay on 01/25/2012.   2.Intraoperative Transesophageal Echocardiography by Dr. Noreene Larsson on 01/25/2012.  3. Pacemaker implantation by Dr. Johney Frame on 02/01/2012.   History of Presenting Illness: This is an 78 year old Caucasian male, nonsmoker, still working as a Visual merchandiser who was seen and evaluated by Dr. Donata Clay regarding an enlarging ascending thoracic aneurysm (measuring over 6 cm), associated with moderate aortic stenosis, and an aortic valve area of   0.95. He underwent a right and left heart catheterization by Dr. Riley Kill on 01/08/2012. This demonstrated PA pressures of 28/16, cardiac output of 4.4, LVEDP 14, and coronary artery disease involving the LAD, 80%, and the circumflex, 90% stenosis. The right coronary was dominant and had moderate disease, but not significant.  He is on minimal medication (aspirin, Cozaar, and Toprol-XL) and when necessary nitroglycerin. His ascending thoracic aneurysm has increased in size over 6 months from 5.5 up to 6 cm. He denies active chest pain. A long discussion was had with the patient and his wife regarding the necessitation to repair the ascending thoracic aortic aneurysm, as well as coronary bypass grafting surgery. Potential risks, benefits, and complications of the surgery were discussed with the patient and he agreed to proceed He was [facility] to [facility]/24[facility]2012 in order to undergo eventual procedure, CABG x3, and placement of left femoral a line by Dr. Donata Clay.  Brief Hospital Course:  He was extubated successfully early the morning of postop day one. He remained afebrile and hemodynamically stable. His Swan-Ganz, A-line, chest tubes, and Foley were all removed early in his postoperative course. He was weaned off his opamine drip.He did have  junctional rhythm and bradycardia (which required pacing postoperatively.) He was not initially placed on a beta blocker secondary to bradycardia. He later developed atrial fibrillation and was placed on Coumadin. His PT and INR were monitored daily.He was  surgically stable for transfer from the intensive care to [facility] on 01/28/2012. He was volume overloaded and diuresed accordingly. He was also found to have acute blood loss anemia. He did not require postoperative transfusion. He continued to  progress with physical therapy and cardiac rehabilitation. His white blood cell count did increase to 17,900. He did remained afebrile, there were no signs of  wound  infection, UA was negative. His central line was removed. His white blood count then did decrease. He does have a history of dysphagia and esophageal dysmotility. A swallow study was done 01/29/2012. There was no esophageal tear. SLP stated he was probably aspirating. Recommendations regarding diet and aspiration precautions were made and followed accordingly.Unfortunately, he continued to be bradycardic and required AV pacing. , An EPS consultation was obtained with Dr. Johney Frame. The patient then underwent permanent pacemaker placement by Dr. Johney Frame on 02/01/2012. Epicardial pacing wires have already been removed. Chest tube sutures will be removed in the morning. He had a mechanical bedside swallow today. SLP recommended increasing to a dysphagia 3 diet with aspiration precautions. He has already had multiple bowel movements. His white blood cell count has remained elevated post op. He remains afebrile, no signs of wound infection, urine culture is negative, and chest xray done on 02/04/2102 showed small b/l pleural effusions and left base opacity (likely atelectasis).As per discussion with Dr. Donata Clay, will discharge home with home health.   Latest Vital Signs: Blood pressure 114/55, pulse 70, temperature 98.2 F (36.8 C), temperature source Oral, resp. rate 19, height 5\' 9"  (1.753 m), weight 241 lb (109.317 kg), SpO2 91.00%.  Physical Exam: Cardiovascular: RRR, no murmurs, gallops, or rubs.  Pulmonary: Diminished at left base; no rales, wheezes, or rhonchi.  Abdomen: Soft, non tender, bowel sounds present.  Extremities: Bilateral lower extremity edema (R>L)  Wounds: Clean and dry. No erythema or signs of infection.   Discharge Condition:Stable  Recent laboratory studies:  Lab Results  Component Value Date   WBC 18.5* 02/02/2012   HGB 9.7* 02/02/2012   HCT 30.9* 02/02/2012   MCV 100.3 02/02/2012   PLT 137 02/02/2012   Lab Results  Component Value Date   NA 136 02/05/2012   K 3.4* 02/05/2012    CL 101 02/05/2012   CO2 25 02/05/2012   CREATININE 1.23 02/05/2012   GLUCOSE 110* 02/05/2012      Diagnostic Studies: Dg Chest 2 View  02/05/2012  *RADIOLOGY REPORT*  Clinical Data: Post CABG, pacer, AVR  CHEST - 2 VIEW  Comparison: 02/03/2012; 01/29/2012  Findings: Grossly unchanged enlarged cardiac silhouette and mediastinal contours post median sternotomy, CABG and aortic valve replacement/repair.  Stable positioning of support apparatus, including the left anterior chest wall dual lead pacemaker with tips overlying expected location right atrium and ventricle. Grossly unchanged small bilateral pleural effusions and bibasilar heterogeneous / consolidative opacities, left greater than right. No new focal airspace opacities.  No definite pulmonary edema. Unchanged bones.  IMPRESSION:  1.  Stable positioning of support apparatus.  No pneumothorax.  2.  Unchanged small bilateral effusions and basilar opacities, left greater than right, possibly atelectasis.  Original Report Authenticated By: Waynard Reeds, M.D.   Discharge Orders    Future Appointments: Provider: Department: Dept Phone: Center:   02/14/2012 4:00 PM [facility] [phone] [facility]   02/21/2012 2:20 PM Jonelle Sidle, MD [facility] [phone] [facility]   02/25/2012 2:00 PM [facility] [phone] [facility]   03/26/2012 11:00 AM Kerin Perna, MD [facility] [phone] [facility]      Discharge Medications: Medication List  As of 02/05/2012  1:06 PM   STOP taking these medications         NITROSTAT 0.4 MG SL tablet         TAKE these medications         aspirin 81 MG tablet  Take 81 mg by mouth daily.      feeding supplement Liqd   Take 237 mLs by mouth 3 (three) times daily with meals.      furosemide 40 MG tablet   Commonly known as: LASIX   Take 1 tablet (40 mg total) by mouth 2 (two) times daily. For 4 days;then take Lasix 40 mg po daily for 5 days.       losartan 25 MG tablet   Commonly known as: COZAAR   Take 1 tablet (25 mg total) by mouth daily.      metoprolol succinate 25 MG 24 hr tablet   Commonly known as: TOPROL-XL   Take 1 tablet (25 mg total) by mouth daily.      potassium chloride SA 20 MEQ tablet   Commonly known as: K-DUR,KLOR-CON   Take 1 tablet (20 mEq total) by mouth 2 (two) times daily. For 4 days;then take Potassium chloride 20 meq po daily for 5 days.      traMADol 50 MG tablet   Commonly known as: ULTRAM   Take 1 tablet (50 mg total) by mouth every 6 (six) hours as needed for pain.      warfarin 5 MG tablet   Commonly known as: COUMADIN   Take 1 tablet (5 mg total) by mouth on Thursday 02/07/2012 at 6 PM;then take Coumadin 1/2 tablet (2.5 mg) po every evening or as directed by Dr. Ival Bible office.            Follow Up Appointments: Follow-up Information    Follow up with Nona Dell, MD. (Appointment is on 5/2/013 at 2:20 pm)    Contact information:   [address] [place] [address] [phone]       Follow up with Mikey Bussing, MD. (PA/LAT CXR to be taken 02/25/2012 at 1:00 pm;Appointment with physician assistant is on 02/25/2012 at 2:00 pm)    Contact information:   [address] [place] [address] [phone]       Follow up with Hillis Range, MD. (PPM wound check is on 02/14/2012 at 4:00 pm)    Contact information:   [address], [address] [place] [address] [phone]       Home health to draw PT/INR on Monday 02/11/2012. Fax results to Dr. Ival Bible office.Also, draw a CBC on this date and fax results to Dr. Zenaida Niece Trigt's office.   Contact information:   [address] [place] [address] [phone]          Signed: Doree Fudge MPA-C 02/05/2012, 1:06 PM

## 2012-02-05 NOTE — Discharge Summary (Signed)
patient examined and medical record reviewed,agree with above note. Douglas [name],Douglas [name] 02/05/2012

## 2012-02-05 NOTE — Progress Notes (Signed)
   CARE MANAGEMENT NOTE 02/05/2012  Patient:  Douglas [name], Douglas [name]   Account Number:  [id number]  Date Initiated:  01/28/2012  Documentation initiated by:  Oda Placke  Subjective/Objective Assessment:   PT S/P CABG X 4 ON 01/24/12.  PTA, PT INDEPENDENT, LIVES WITH WIFE.     Action/Plan:   MET WITH PT TO DISCUSS DC PLANS.   HE STATES SPOUSE WILL PROVIDE 24HR CARE AT DISCHARGE.  HE HAS RW AT HOME, IF NEEDED.   Anticipated DC Date:  01/31/2012   Anticipated DC Plan:  HOME W HOME HEALTH SERVICES      DC Planning Services  CM consult      [facility]  HOME HEALTH   Choice offered to / List presented to:  C-1 Patient        HH arranged  HH-1 RN  HH-2 PT  HH-5 SPEECH THERAPY  HH-4 NURSE'S AIDE      HH agency  [facility]   Status of service:  Completed, signed off Medicare Important Message given?   (If response is "NO", the following Medicare IM given date fields will be blank) Date Medicare IM given:   Date Additional Medicare IM given:    Discharge Disposition:  HOME W HOME HEALTH SERVICES  Per UR Regulation:    If discussed at Long Length of Stay Meetings, dates discussed:    Comments:  02/06/12 Chastity Noland,RN,BSN 1600 PT/FAMILY WISHES TO USE [facility] FOR HOME CARE.  REFERRAL TO MARY WITH [facility] FOR [facility] NEEDS.   START OF CARE 24H-48H POST DC DATE. NO DME NEEDS. Phone #[phone]   02/04/12 Kari Kerth,RN,BSN 1410 PT WILL NEED HOME HEALTH FOLLOW UP AT DC.  PT GIVEN [place] [facility] PROVIDER LIST TO REVIEW WITH WIFE.  WILL FOLLOW UP IN AM FOR PT CHOICE.  02/01/12 Ambera Fedele,RN,BSN 1153 PT TO HAVE PERMANENT PACEMAKER PLACED TODAY FOR COMPLETE HEART BLOCK.  WILL CONT TO FOLLOW POST PROCEDURE.  01/28/12 Shannie Kontos,RN,BSN 1539 WILL CONT TO FOLLOW PT FOR HOME NEEDS AS HE PROGRESSES.

## 2012-02-05 NOTE — Progress Notes (Addendum)
4 Days Post-Op Procedure(s) (LRB): PERMANENT PACEMAKER INSERTION (N/A)  Subjective: Patient with bowel movement. Finished his breakfast for the first time without difficulty.  Objective: Vital signs in last 24 hours: Patient Vitals for the past 24 hrs:  BP Temp Temp src Pulse Resp SpO2 Weight  02/05/12 0520 105/60 mmHg 98.2 F (36.8 C) Oral 70  19  91 % 241 lb (109.317 kg)  02/04/12 2026 100/54 mmHg 97.4 F (36.3 C) Oral 70  20  95 % -  02/04/12 1700 102/47 mmHg - - 70  - 96 % -  02/04/12 1112 126/56 mmHg - - 70  - - -   Pre op weight  108 kg Current Weight  02/05/12 241 lb (109.317 kg)      Intake/Output from previous day: 04/15 0701 - 04/16 0700 In: 240 [P.O.:240] Out: 1250 [Urine:1250]   Physical Exam:  Cardiovascular: RRR, no murmurs, gallops, or rubs. Pulmonary: Diminished at left base; no rales, wheezes, or rhonchi. Abdomen: Soft, non tender, bowel sounds present. Extremities:  Bilateral lower extremity edema (R>L) Wounds: Clean and dry.  No erythema or signs of infection.  Lab Results: CBC:No results found for this basename: WBC:2,HGB:2,HCT:2,PLT:2 in the last 72 hours BMET:  Basename 02/05/12 0600  NA 136  K 3.4*  CL 101  CO2 25  GLUCOSE 110*  BUN 39*  CREATININE 1.23  CALCIUM 8.4    PT/INR:  Basename 02/05/12 0600  LABPROT 18.6*  INR 1.52*   ABG:  INR: Will add last result for INR, ABG once components are confirmed Will add last 4 CBG results once components are confirmed  Assessment/Plan:  1. CV - V paced at 70.Continue Norvasc 5 daily, Lopressor 12.5 bid, Coumadin. 2.  Pulmonary - Encourage incentive spirometer.CXR this am shows stable cardiomegaly, bilateral pleural effusions (L>R), and bibasilar atelectasis (L>R). 3. Volume Overload - Continue with diuresis. 4.  Acute blood loss anemia - Last H/H 9.5/30.5. 5.Supplement Potassium. 6.GI- On Dysphagia 2 diet.Will order MBS today. 7.Possible discharge in am.   ZIMMERMAN,DONIELLE  MPA-C 02/05/2012   agree with above

## 2012-02-05 NOTE — Discharge Instructions (Signed)
Activity: 1.May walk up steps                2.No lifting more than ten pounds for four weeks.                 3.No driving for four weeks.                4.Stop any activity that causes chest pain, shortness of breath, dizziness, sweating or excessive weakness.                5.Avoid straining.                6.Continue with your breathing exercises daily.  Diet: Low fat, Low salt diet  Wound Care: May shower.  Clean wounds with mild soap and water daily. Contact the office at [phone] if any problems arise.  Coronary Artery Bypass Grafting Care After Refer to this sheet in the next few weeks. These instructions provide you with information on caring for yourself after your procedure. Your caregiver may also give you more specific instructions. Your treatment has been planned according to current medical practices, but problems sometimes occur. Call your caregiver if you have any problems or questions after your procedure.  Recovery from open heart surgery will be different for everyone. Some people feel well after 3 or 4 weeks, while for others it takes longer. After heart surgery, it may be normal to:  Not have an appetite, feel nauseated by the smell of food, or only want to eat a small amount.   Be constipated because of changes in your diet, activity, and medicines. Eat foods high in fiber. Add fresh fruits and vegetables to your diet. Stool softeners may be helpful.   Feel sad or unhappy. You may be frustrated or cranky. You may have good days and bad days. Do not give up. Talk to your caregiver if you do not feel better.   Feel weakness and fatigue. You many need physical therapy or cardiac rehabilitation to get your strength back.   Develop an irregular heartbeat called atrial fibrillation. Symptoms of atrial fibrillation are a fast, irregular heartbeat or feelings of fluttery heartbeats, shortness of breath, low blood pressure, and dizziness. If these symptoms develop, see your  caregiver right away.  MEDICATION  Have a list of all the medicines you will be taking when you leave the hospital. For every medicine, know the following:   Name.   Exact dose.   Time of day to be taken.   How often it should be taken.   Why you are taking it.   Ask which medicines should or should not be taken together. If you take more than one heart medicine, ask if it is okay to take them together. Some heart medicines should not be taken at the same time because they may lower your blood pressure too much.   Narcotic pain medicine can cause constipation. Eat fresh fruits and vegetables. Add fiber to your diet. Stool softener medicine may help relieve constipation.   Keep a copy of your medicines with you at all times.   Do not add or stop taking any medicine until you check with your caregiver.   Medicines can have side effects. Call your caregiver who prescribed the medicine if you:   Start throwing up, have diarrhea, or have stomach pain.   Feel dizzy or lightheaded when you stand up.   Feel your heart is skipping beats or is beating too fast  or too slow.   Develop a rash.   Notice unusual bruising or bleeding.  HOME CARE INSTRUCTIONS  After heart surgery, it is important to learn how to take your pulse. Have your caregiver show you how to take your pulse.   Use your incentive spirometer. Ask your caregiver how long after surgery you need to use it.  Care of your chest incision  Tell your caregiver right away if you notice clicking in your chest (sternum).   Support your chest with a pillow or your arms when you take deep breaths and cough.   Follow your caregiver's instructions about when you can bathe or swim.   Protect your incision from sunlight during the first year to keep the scar from getting dark.   Tell your caregiver if you notice:   Increased tenderness of your incision.   Increased redness or swelling around your incision.   Drainage or pus  from your incision.  Care of your leg incision(s)  Avoid crossing your legs.   Avoid sitting for long periods of time. Change positions every half hour.   Elevate your leg(s) when you are sitting.   Check your leg(s) daily for swelling. Check the incisions for redness or drainage.   Wear your elastic stockings as told by your caregiver. Take them off at bedtime.  Diet  Diet is very important to heart health.   Eat plenty of fresh fruits and vegetables. Meats should be lean cut. Avoid canned, processed, and fried foods.   Talk to a dietician. They can teach you how to make healthy food and drink choices.  Weight  Weigh yourself every day. This is important because it helps to know if you are retaining fluid that may make your heart and lungs work harder.   Use the same scale each time.   Weigh yourself every morning at the same time. You should do this after you go to the bathroom, but before you eat breakfast.   Your weight will be more accurate if you do not wear any clothes.   Record your weight.   Tell your caregiver if you have gained 2 pounds or more overnight.  Activity Stop any activity at once if you have chest pain, shortness of breath, irregular heartbeats, or dizziness. Get help right away if you have any of these symptoms.  Bathing.  Avoid soaking in a bath or hot tub until your incisions are healed.   Rest. You need a balance of rest and activity.   Exercise. Exercise per your caregiver's advice. You may need physical therapy or cardiac rehabilitation to help strengthen your muscles and build your endurance.   Climbing stairs. Unless your caregiver tells you not to climb stairs, go up stairs slowly and rest if you tire. Do not pull yourself up by the handrail.   Driving a car. Follow your caregiver's advice on when you may drive. You may ride as a passenger at any time. When traveling for long periods of time in a car, get out of the car and walk around for a  few minutes every 2 hours.   Lifting. Avoid lifting, pushing, or pulling anything heavier than 10 pounds for 6 weeks after surgery or as told by your caregiver.   Returning to work. Check with your caregiver. People heal at different rates. Most people will be able to go back to work 6 to 12 weeks after surgery.   Sexual activity. You may resume sexual relations as told by your  caregiver.  SEEK MEDICAL CARE IF:  Any of your incisions are red, painful, or have any type of drainage coming from them.   You have an oral temperature above 102 F (38.9 C).   You have ankle or leg swelling.   You have pain in your legs.   You have weight gain of 2 or more pounds a day.   You feel dizzy or lightheaded when you stand up.  SEEK IMMEDIATE MEDICAL CARE IF:  You have angina or chest pain that goes to your jaw or arms. Call your local emergency services right away.   You have shortness of breath at rest or with activity.   You have a fast or irregular heartbeat (arrhythmia).   There is a "clicking" in your sternum when you move.   You have numbness or weakness in your arms or legs.  MAKE SURE YOU:  Understand these instructions.   Will watch your condition.   Will get help right away if you are not doing well or get worse.  Document Released: 04/27/2005 Document Revised: 09/27/2011 Document Reviewed: 12/13/2010 [facility] Patient Information 2012 [place], [place].

## 2012-02-06 LAB — CBC
MCV: 100.3 fL — ABNORMAL HIGH (ref 78.0–100.0)
Platelets: 137 10*3/uL — ABNORMAL LOW (ref 150–400)
RBC: 3.08 MIL/uL — ABNORMAL LOW (ref 4.22–5.81)
WBC: 18.5 10*3/uL — ABNORMAL HIGH (ref 4.0–10.5)

## 2012-02-06 LAB — PROTIME-INR: INR: 1.5 — ABNORMAL HIGH (ref 0.00–1.49)

## 2012-02-06 MED ORDER — WARFARIN SODIUM 5 MG PO TABS: 5.0000 mg | ORAL_TABLET | ORAL | Status: DC

## 2012-02-06 MED ORDER — WARFARIN SODIUM 5 MG PO TABS
5.0000 mg | ORAL_TABLET | Freq: Every day | ORAL | Status: DC
  Filled 2012-02-06: qty 1

## 2012-02-06 MED ORDER — WARFARIN SODIUM 5 MG PO TABS: 5.0000 mg | ORAL_TABLET | Freq: Every day | ORAL | Status: DC

## 2012-02-06 NOTE — Progress Notes (Addendum)
**  Late Note Entry for 02/05/12**  PT Progress Note:    02/05/12 1400  PT Visit Information  Last PT Received On 02/05/12  Precautions  Precautions Sternal;Fall  Restrictions  Weight Bearing Restrictions No  Other Position/Activity Restrictions Sternal precautions  Bed Mobility  Bed Mobility No  Transfers  Transfers Yes  Sit to Stand Other (comment);From chair/3-in-1;With upper extremity assist (Min Guard (A))  Sit to Stand Details (indicate cue type and reason) cues for limited use of UE's to adhere to sternal precautions- pt did well with this.    Stand to Sit Other (comment);To chair/3-in-1;With upper extremity assist;With armrests (Min Guard (A))  Stand to Sit Details Cues for limited use of UE's.   Ambulation/Gait  Ambulation/Gait Yes  Ambulation/Gait Assistance Other (comment) (Min Guard (A) without physical contact)  Ambulation/Gait Assistance Details (indicate cue type and reason) Cues for increased upright posture, to look up.  Pt required multiple standing rest breaks due to fatigue/decreased activity tolerance.    Ambulation Distance (Feet) 200 Feet  Assistive device Rolling walker  Gait Pattern Decreased stride length;Trunk flexed  Gait velocity Decreased  Stairs Yes  Stairs Assistance 4: Min assist  Stairs Assistance Details (indicate cue type and reason) (A) provided for stability & safety.  Pt's wife present & performed stairs with pt 2nd trial.    Stair Management Technique One rail Right;Forwards;Step to pattern (rail on right & HHA on Left.  )  Number of Stairs 3  (2x's. )  Wheelchair Mobility  Wheelchair Mobility No  Posture/Postural Control  Posture/Postural Control No significant limitations  Balance  Balance Assessed No  PT - End of Session  Equipment Utilized During Treatment Gait belt  Activity Tolerance Patient limited by fatigue;Patient tolerated treatment well  Patient left in chair;with call bell in reach;with family/visitor present  Nurse  Communication Mobility status for transfers;Mobility status for ambulation  General  Behavior During Session [facility] for tasks performed  Cognition [facility] for tasks performed  PT - Assessment/Plan  Comments on Treatment Session Pt did well with stair training today.  Moves fairly well but fatigues quickly.   Required 3-4 standing rest breaks throughout ambulation.    PT Plan Discharge plan remains appropriate;Frequency remains appropriate  PT Frequency Min 3X/week  Follow Up Recommendations Home health PT;Supervision/Assistance - 24 hour  Equipment Recommended Rolling walker with 5" wheels  Acute Rehab PT Goals  PT Goal: Sit to Stand - Progress Progressing toward goal  PT Goal: Stand to Sit - Progress Progressing toward goal  PT Goal: Ambulate - Progress Progressing toward goal  PT Goal: Up/Down Stairs - Progress Progressing toward goal     Verdell Face, [place] [phone] 02/06/2012

## 2012-02-06 NOTE — Progress Notes (Signed)
Patient ambulated 150 feet with RN and student nurse, using front wheel rolling walker. Patient's gait was steady. Patient tolerated ambulation well and vital signs were stable. Returned to chair. Will continue to monitor.

## 2012-02-06 NOTE — Progress Notes (Signed)
Physical Therapy Treatment Patient Details Name: Douglas [name]. MRN: [id number] DOB: [birth year] Today's Date: 02/06/2012  PT Assessment/Plan  PT - Assessment/Plan Comments on Treatment Session: Pt feels ready to d/c today, discussed d/c issues including car transfer, activity level appropriate for first few days, f/u HHPT, and OP cardiac rehab.  Pt still tiring quickly but progressing well with safety of mobility and safe for d/c home.  Cont to rec HHPT and RW PT Plan: Discharge plan remains appropriate;Frequency remains appropriate PT Frequency: Min 3X/week Recommendations for Other Services: OT consult Follow Up Recommendations: Home health PT;Supervision/Assistance - 24 hour Equipment Recommended: Rolling walker with 5" wheels PT Goals  Acute Rehab PT Goals PT Goal Formulation: With patient Time For Goal Achievement: 7 days Pt will go Supine/Side to Sit: with modified independence PT Goal: Supine/Side to Sit - Progress: Progressing toward goal Pt will go Sit to Supine/Side: with modified independence PT Goal: Sit to Supine/Side - Progress: Progressing toward goal Pt will go Sit to Stand: with modified independence PT Goal: Sit to Stand - Progress: Progressing toward goal Pt will go Stand to Sit: with modified independence PT Goal: Stand to Sit - Progress: Met Pt will Ambulate: >150 feet;with modified independence;with least restrictive assistive device PT Goal: Ambulate - Progress: Progressing toward goal Pt will Go Up / Down Stairs: 3-5 stairs;with min assist;with least restrictive assistive device PT Goal: Up/Down Stairs - Progress: Progressing toward goal  PT Treatment Precautions/Restrictions  Precautions Precautions: Sternal;Fall Precaution Comments: reviewed sternal precautions, pt with good grasp of precautions Required Braces or Orthoses DO NOT USE: No Restrictions Weight Bearing Restrictions: No RUE Weight Bearing:  (sternal precautions) LUE Weight Bearing:   (sternal precautions) Other Position/Activity Restrictions: Sternal precautions Mobility (including Balance) Bed Mobility Bed Mobility: Yes Rolling Right: 6: Modified independent (Device/Increase time) Right Sidelying to Sit: 5: Supervision;HOB flat Right Sidelying to Sit Details (indicate cue type and reason): no rail used, vc's for getting all the way on his side before dropping legs off.  Pt surprised at his ability to get up without physical assist Sit to Sidelying Right: 5: Supervision;HOB flat Sit to Sidelying Right Details (indicate cue type and reason): no rail used, vc's to maintain SL until legs in bed and then roll to back Transfers Transfers: Yes Sit to Stand: 5: Supervision;From chair/3-in-1;From bed Sit to Stand Details (indicate cue type and reason): pt kept sternal precautions Stand to Sit: To bed;To chair/3-in-1;5: Supervision Stand to Sit Details: vc's to control descent Ambulation/Gait Ambulation/Gait: Yes Ambulation/Gait Assistance: 5: Supervision Ambulation/Gait Assistance Details (indicate cue type and reason): vc's for erect posture, pt with fewer  and shorter rest breaks today, still with DOE  Ambulation Distance (Feet): 250 Feet Assistive device: Rolling walker Gait Pattern: Decreased stride length;Trunk flexed Gait velocity: Decreased Stairs: No Wheelchair Mobility Wheelchair Mobility: No  Posture/Postural Control Posture/Postural Control: No significant limitations Balance Balance Assessed: Yes Dynamic Standing Balance Dynamic Standing - Balance Support: During functional activity;Bilateral upper extremity supported Dynamic Standing - Level of Assistance: 5: Stand by assistance End of Session PT - End of Session Equipment Utilized During Treatment: Gait belt Activity Tolerance: Patient limited by fatigue;Patient tolerated treatment well Patient left: in chair;with call bell in reach Nurse Communication: Mobility status for  ambulation General Behavior During Session: [facility] for tasks performed Cognition: [facility] for tasks performed Lyanne Co, PT  Acute Rehab Services  [phone] Lyanne Co 02/06/2012, 10:03 AM

## 2012-02-06 NOTE — Progress Notes (Signed)
Cardiac Rehab [phone] Education completed with pt and family. Permission given to refer to [place] Phase 2. Kaela Beitz DunlapRn

## 2012-02-06 NOTE — Progress Notes (Addendum)
5 Days Post-Op Procedure(s) (LRB): PERMANENT PACEMAKER INSERTION (N/A)  Subjective: Patient finished breakfast again. Feels fairly well and really wants to go home.  Objective: Vital signs in last 24 hours: Patient Vitals for the past 24 hrs:  BP Temp Temp src Pulse Resp SpO2 Weight  02/06/12 0629 - - - - - - 242 lb 8.1 oz (110 kg)  02/06/12 0605 105/53 mmHg 97 F (36.1 C) Oral 70  19  95 % -  02/05/12 2250 114/51 mmHg - - 66  - - -  02/05/12 2030 94/54 mmHg 97.7 F (36.5 C) Oral 70  20  92 % -  02/05/12 1335 106/64 mmHg 98.3 F (36.8 C) Oral 70  21  100 % -  02/05/12 1018 114/55 mmHg - - 70  - - -   Pre op weight  108 kg Current Weight  02/06/12 242 lb 8.1 oz (110 kg)      Intake/Output from previous day: 04/16 0701 - 04/17 0700 In: 480 [P.O.:480] Out: 875 [Urine:875]   Physical Exam:  Cardiovascular: RRR, no murmurs, gallops, or rubs. Pulmonary: Diminished at left base; no rales, wheezes, or rhonchi. Abdomen: Soft, non tender, bowel sounds present. Extremities:  Bilateral lower extremity edema (R>L) but is slowly decreasing Wounds: Clean and dry.  No erythema or signs of infection.  Lab Results: CBC:  Basename 02/06/12 0505  WBC 18.5*  HGB 9.7*  HCT 30.9*  PLT 137*   BMET:   Basename 02/05/12 0600  NA 136  K 3.4*  CL 101  CO2 25  GLUCOSE 110*  BUN 39*  CREATININE 1.23  CALCIUM 8.4    PT/INR:   Basename 02/06/12 0505  LABPROT 18.4*  INR 1.50*   ABG:  INR: Will add last result for INR, ABG once components are confirmed Will add last 4 CBG results once components are confirmed  Assessment/Plan:  1. CV - V paced at 70.Continue Norvasc 5 daily, Lopressor 12.5 bid, and will increase Coumadin to 5 mg for 2 doses. 2.  Pulmonary - Encourage incentive spirometer.CXR this am shows stable cardiomegaly, bilateral pleural effusions (L>R), and bibasilar atelectasis (L>R). 3. Volume Overload - Continue with diuresis. 4.  Acute blood loss anemia - Last  H/H 9.7/30.9. 5.Leukocytosis-WBC 18,500 and he remains afebrile. There are no signs of wound infection.CXR on 4/16 showed b/l pleural effusions and left base small opacity (probably atelectasis).UC done 4/14 was negative. 6.GI- On Dysphagia 3 diet now. 7.Mild thrombocytopenia-Platelets 137,000. 8.As per discussion with Dr Donata Clay, ok to discharge home.   Jene Huq MPA-C 02/06/2012

## 2012-02-06 NOTE — Progress Notes (Signed)
Per MD order, PICC line removed. Cath intact at 45cm. Vaseline pressure gauze to site, pressure held x . No bleeding to site. Pt instructed to keep dressing CDI x 24 hours. Avoid heavy lifting, pushing or pulling x 24 hours,  If bleeding occurs hold pressure, if bleeding does not stop contact MD or go to the ED. Neither Pt nor family have any questions. Consuello Masse

## 2012-02-08 DIAGNOSIS — M159 Polyosteoarthritis, unspecified: Secondary | ICD-10-CM | POA: Diagnosis not present

## 2012-02-08 DIAGNOSIS — I2510 Atherosclerotic heart disease of native coronary artery without angina pectoris: Secondary | ICD-10-CM | POA: Diagnosis not present

## 2012-02-08 DIAGNOSIS — M353 Polymyalgia rheumatica: Secondary | ICD-10-CM | POA: Diagnosis not present

## 2012-02-08 DIAGNOSIS — R1310 Dysphagia, unspecified: Secondary | ICD-10-CM | POA: Diagnosis not present

## 2012-02-08 DIAGNOSIS — Z48812 Encounter for surgical aftercare following surgery on the circulatory system: Secondary | ICD-10-CM | POA: Diagnosis not present

## 2012-02-08 DIAGNOSIS — I10 Essential (primary) hypertension: Secondary | ICD-10-CM | POA: Diagnosis not present

## 2012-02-11 ENCOUNTER — Ambulatory Visit: Payer: Self-pay | Admitting: *Deleted

## 2012-02-11 DIAGNOSIS — M353 Polymyalgia rheumatica: Secondary | ICD-10-CM | POA: Diagnosis not present

## 2012-02-11 DIAGNOSIS — Z954 Presence of other heart-valve replacement: Secondary | ICD-10-CM | POA: Insufficient documentation

## 2012-02-11 DIAGNOSIS — I10 Essential (primary) hypertension: Secondary | ICD-10-CM | POA: Diagnosis not present

## 2012-02-11 DIAGNOSIS — I4891 Unspecified atrial fibrillation: Secondary | ICD-10-CM

## 2012-02-11 DIAGNOSIS — I2510 Atherosclerotic heart disease of native coronary artery without angina pectoris: Secondary | ICD-10-CM | POA: Diagnosis not present

## 2012-02-11 DIAGNOSIS — M159 Polyosteoarthritis, unspecified: Secondary | ICD-10-CM | POA: Diagnosis not present

## 2012-02-11 DIAGNOSIS — Z7901 Long term (current) use of anticoagulants: Secondary | ICD-10-CM | POA: Insufficient documentation

## 2012-02-11 DIAGNOSIS — Z48812 Encounter for surgical aftercare following surgery on the circulatory system: Secondary | ICD-10-CM | POA: Diagnosis not present

## 2012-02-11 DIAGNOSIS — R1310 Dysphagia, unspecified: Secondary | ICD-10-CM | POA: Diagnosis not present

## 2012-02-11 LAB — POCT INR: INR: 1.4

## 2012-02-11 NOTE — Discharge Summary (Signed)
patient examined and medical record reviewed,agree with above note. VAN TRIGT III,Zori Benbrook 02/11/2012    

## 2012-02-13 ENCOUNTER — Telehealth: Payer: Self-pay

## 2012-02-13 ENCOUNTER — Ambulatory Visit: Payer: Self-pay | Admitting: *Deleted

## 2012-02-13 DIAGNOSIS — Z48812 Encounter for surgical aftercare following surgery on the circulatory system: Secondary | ICD-10-CM | POA: Diagnosis not present

## 2012-02-13 DIAGNOSIS — Z954 Presence of other heart-valve replacement: Secondary | ICD-10-CM

## 2012-02-13 DIAGNOSIS — I2510 Atherosclerotic heart disease of native coronary artery without angina pectoris: Secondary | ICD-10-CM | POA: Diagnosis not present

## 2012-02-13 DIAGNOSIS — Z7901 Long term (current) use of anticoagulants: Secondary | ICD-10-CM

## 2012-02-13 DIAGNOSIS — M353 Polymyalgia rheumatica: Secondary | ICD-10-CM | POA: Diagnosis not present

## 2012-02-13 DIAGNOSIS — R079 Chest pain, unspecified: Secondary | ICD-10-CM

## 2012-02-13 DIAGNOSIS — I4891 Unspecified atrial fibrillation: Secondary | ICD-10-CM

## 2012-02-13 DIAGNOSIS — M159 Polyosteoarthritis, unspecified: Secondary | ICD-10-CM | POA: Diagnosis not present

## 2012-02-13 DIAGNOSIS — R1310 Dysphagia, unspecified: Secondary | ICD-10-CM | POA: Diagnosis not present

## 2012-02-13 DIAGNOSIS — I10 Essential (primary) hypertension: Secondary | ICD-10-CM | POA: Diagnosis not present

## 2012-02-13 LAB — POCT INR: INR: 1.6

## 2012-02-13 NOTE — Telephone Encounter (Signed)
**Note De-Identified Lunetta Marina Obfuscation** Pt. was released from [facility] on 4-16 after having CABG X 3 and placement of left femoral a line by Dr. Donata Clay and he is due to f/u with Dr. Diona Browner on 02-21-12. Pt. was directed at discharge to take Lasix 40 mg bid X 4 days then decrease to 40 mg QD X 5 days then stop taking.  Marisue Ivan, [facility] with [facility], states that pt's weight on 4-22 was 239 lbs. and this morning his weight was 241 lbs. She states her concern is that pt. has only 1 dose left and that he is having some SOB, coughing, swelling and the 2 pound weight gain and wants Dr. Diona Browner to be aware and if there is anything he can suggest until f/u on 5-2. Please advise./LV

## 2012-02-13 NOTE — Telephone Encounter (Signed)
Please refill Lasix 40 mg tablets and potassium chloride 20 mEq tablets with plan to continue both daily for now. He will need BMET for followup visit.

## 2012-02-14 ENCOUNTER — Telehealth: Payer: Self-pay | Admitting: Cardiology

## 2012-02-14 ENCOUNTER — Ambulatory Visit (INDEPENDENT_AMBULATORY_CARE_PROVIDER_SITE_OTHER): Payer: Medicare Other | Admitting: *Deleted

## 2012-02-14 ENCOUNTER — Encounter: Payer: Self-pay | Admitting: Internal Medicine

## 2012-02-14 ENCOUNTER — Other Ambulatory Visit: Payer: Self-pay

## 2012-02-14 DIAGNOSIS — I442 Atrioventricular block, complete: Secondary | ICD-10-CM

## 2012-02-14 DIAGNOSIS — R079 Chest pain, unspecified: Secondary | ICD-10-CM

## 2012-02-14 LAB — PACEMAKER DEVICE OBSERVATION
ATRIAL PACING PM: 2
BRDY-0002RV: 70 {beats}/min
BRDY-0003RV: 120 {beats}/min
BRDY-0004RV: 120 {beats}/min
RV LEAD THRESHOLD: 0.75 V

## 2012-02-14 MED ORDER — POTASSIUM CHLORIDE CRYS ER 20 MEQ PO TBCR: 20.0000 meq | EXTENDED_RELEASE_TABLET | Freq: Every day | ORAL | Status: DC

## 2012-02-14 MED ORDER — FUROSEMIDE 40 MG PO TABS: 40.0000 mg | ORAL_TABLET | Freq: Every day | ORAL | Status: DC

## 2012-02-14 NOTE — Progress Notes (Signed)
Wound check-PPM 

## 2012-02-14 NOTE — Telephone Encounter (Signed)
**Note De-Identified Taraneh Metheney Obfuscation** Pt's wife is advised to tell pt. to start taking Prednisone that Dr. Ouida Sills prescribed b/c s/s are consistent with gout s/s and to call us back if s/s do not improve or worsen./LV

## 2012-02-14 NOTE — Telephone Encounter (Signed)
Per patient's wife, patient's legs starting hurting last night.  Stated that patient has had gout prior to and has called Dr.Fagan and he prescribed Prednisone.  Patient's wife stated that Physical Therapy done "new stretches" yesterday and he is hurting on the same side of the new stretches.  Please return phone call. Knute Neu

## 2012-02-14 NOTE — Telephone Encounter (Signed)
**Note De-Identified [name]** Douglas [name], HHN, and pt. advised, they both verbalized understanding. Douglas [name] states that pt. will need to come to lab to have BMET drawn b/c she attempted to drawn labs for Dr. Donata Clay but was unable. Pt. states he will try to have BMET drawn before OV on 5-2 but states he may not be able to do so until the day of OV. RX sent to [facility]. to fill and lab order faxed to [facility] lab./LV

## 2012-02-15 DIAGNOSIS — Z48812 Encounter for surgical aftercare following surgery on the circulatory system: Secondary | ICD-10-CM | POA: Diagnosis not present

## 2012-02-15 DIAGNOSIS — M159 Polyosteoarthritis, unspecified: Secondary | ICD-10-CM | POA: Diagnosis not present

## 2012-02-15 DIAGNOSIS — M353 Polymyalgia rheumatica: Secondary | ICD-10-CM | POA: Diagnosis not present

## 2012-02-15 DIAGNOSIS — R1310 Dysphagia, unspecified: Secondary | ICD-10-CM | POA: Diagnosis not present

## 2012-02-15 DIAGNOSIS — I10 Essential (primary) hypertension: Secondary | ICD-10-CM | POA: Diagnosis not present

## 2012-02-15 DIAGNOSIS — I2510 Atherosclerotic heart disease of native coronary artery without angina pectoris: Secondary | ICD-10-CM | POA: Diagnosis not present

## 2012-02-16 DIAGNOSIS — Z48812 Encounter for surgical aftercare following surgery on the circulatory system: Secondary | ICD-10-CM | POA: Diagnosis not present

## 2012-02-16 DIAGNOSIS — I2510 Atherosclerotic heart disease of native coronary artery without angina pectoris: Secondary | ICD-10-CM | POA: Diagnosis not present

## 2012-02-16 DIAGNOSIS — I10 Essential (primary) hypertension: Secondary | ICD-10-CM | POA: Diagnosis not present

## 2012-02-16 DIAGNOSIS — R1310 Dysphagia, unspecified: Secondary | ICD-10-CM | POA: Diagnosis not present

## 2012-02-16 DIAGNOSIS — M353 Polymyalgia rheumatica: Secondary | ICD-10-CM | POA: Diagnosis not present

## 2012-02-16 DIAGNOSIS — M159 Polyosteoarthritis, unspecified: Secondary | ICD-10-CM | POA: Diagnosis not present

## 2012-02-18 ENCOUNTER — Ambulatory Visit: Payer: Self-pay | Admitting: *Deleted

## 2012-02-18 DIAGNOSIS — I2510 Atherosclerotic heart disease of native coronary artery without angina pectoris: Secondary | ICD-10-CM | POA: Diagnosis not present

## 2012-02-18 DIAGNOSIS — I4891 Unspecified atrial fibrillation: Secondary | ICD-10-CM

## 2012-02-18 DIAGNOSIS — M159 Polyosteoarthritis, unspecified: Secondary | ICD-10-CM | POA: Diagnosis not present

## 2012-02-18 DIAGNOSIS — M353 Polymyalgia rheumatica: Secondary | ICD-10-CM | POA: Diagnosis not present

## 2012-02-18 DIAGNOSIS — I10 Essential (primary) hypertension: Secondary | ICD-10-CM | POA: Diagnosis not present

## 2012-02-18 DIAGNOSIS — Z954 Presence of other heart-valve replacement: Secondary | ICD-10-CM

## 2012-02-18 DIAGNOSIS — Z48812 Encounter for surgical aftercare following surgery on the circulatory system: Secondary | ICD-10-CM | POA: Diagnosis not present

## 2012-02-18 DIAGNOSIS — Z7901 Long term (current) use of anticoagulants: Secondary | ICD-10-CM

## 2012-02-18 DIAGNOSIS — R1310 Dysphagia, unspecified: Secondary | ICD-10-CM | POA: Diagnosis not present

## 2012-02-18 LAB — POCT INR: INR: 2.5

## 2012-02-19 DIAGNOSIS — R079 Chest pain, unspecified: Secondary | ICD-10-CM | POA: Diagnosis not present

## 2012-02-20 ENCOUNTER — Other Ambulatory Visit: Payer: Self-pay

## 2012-02-20 DIAGNOSIS — Z48812 Encounter for surgical aftercare following surgery on the circulatory system: Secondary | ICD-10-CM | POA: Diagnosis not present

## 2012-02-20 DIAGNOSIS — I2510 Atherosclerotic heart disease of native coronary artery without angina pectoris: Secondary | ICD-10-CM | POA: Diagnosis not present

## 2012-02-20 DIAGNOSIS — I10 Essential (primary) hypertension: Secondary | ICD-10-CM | POA: Diagnosis not present

## 2012-02-20 DIAGNOSIS — M353 Polymyalgia rheumatica: Secondary | ICD-10-CM | POA: Diagnosis not present

## 2012-02-20 DIAGNOSIS — M159 Polyosteoarthritis, unspecified: Secondary | ICD-10-CM | POA: Diagnosis not present

## 2012-02-20 DIAGNOSIS — R1310 Dysphagia, unspecified: Secondary | ICD-10-CM | POA: Diagnosis not present

## 2012-02-20 LAB — BASIC METABOLIC PANEL
Glucose, Bld: 105 mg/dL — ABNORMAL HIGH (ref 70–99)
Potassium: 5.3 mEq/L (ref 3.5–5.3)
Sodium: 137 mEq/L (ref 135–145)

## 2012-02-21 ENCOUNTER — Ambulatory Visit (INDEPENDENT_AMBULATORY_CARE_PROVIDER_SITE_OTHER): Payer: Medicare Other | Admitting: Cardiology

## 2012-02-21 ENCOUNTER — Other Ambulatory Visit: Payer: Self-pay | Admitting: Cardiothoracic Surgery

## 2012-02-21 ENCOUNTER — Encounter: Payer: Self-pay | Admitting: Cardiology

## 2012-02-21 VITALS — BP 113/62 | HR 71 | Resp 16 | Ht 68.0 in | Wt 238.0 lb

## 2012-02-21 DIAGNOSIS — I10 Essential (primary) hypertension: Secondary | ICD-10-CM | POA: Diagnosis not present

## 2012-02-21 DIAGNOSIS — I2510 Atherosclerotic heart disease of native coronary artery without angina pectoris: Secondary | ICD-10-CM | POA: Diagnosis not present

## 2012-02-21 DIAGNOSIS — I712 Thoracic aortic aneurysm, without rupture: Secondary | ICD-10-CM

## 2012-02-21 DIAGNOSIS — I442 Atrioventricular block, complete: Secondary | ICD-10-CM

## 2012-02-21 DIAGNOSIS — I350 Nonrheumatic aortic (valve) stenosis: Secondary | ICD-10-CM

## 2012-02-21 DIAGNOSIS — I359 Nonrheumatic aortic valve disorder, unspecified: Secondary | ICD-10-CM

## 2012-02-21 DIAGNOSIS — I4891 Unspecified atrial fibrillation: Secondary | ICD-10-CM | POA: Diagnosis not present

## 2012-02-21 MED ORDER — FUROSEMIDE 40 MG PO TABS: 60.0000 mg | ORAL_TABLET | Freq: Every day | ORAL | Status: DC

## 2012-02-21 NOTE — Assessment & Plan Note (Signed)
Status post bioprosthetic aortic valve replacement.

## 2012-02-21 NOTE — Assessment & Plan Note (Signed)
Multivessel disease status post CABG. LVEF has been normal although he has evidence of continued volume overload. Plan is to advance his Lasix to 60 mg daily at least for the time being. He will get a chest x-ray with Dr. Donata Clay next week. Followup arranged in the next 2-3 weeks with BMET.

## 2012-02-21 NOTE — Patient Instructions (Signed)
**Note De-Identified Haaris Metallo Obfuscation** Your physician has recommended you make the following change in your medication: increase Furosemide to 60 mg (1 and 1/2 tablets) daily  Your physician recommends that you schedule a follow-up appointment in: 3 weeks

## 2012-02-21 NOTE — Assessment & Plan Note (Signed)
Status post Medtronic pacemaker placement by Dr. Johney Frame. Continue followup in the device clinic.

## 2012-02-21 NOTE — Progress Notes (Signed)
Clinical Summary [name] is a 78 y.o.male presenting for post hospital followup. I last saw him March. He is now status post Bentall procedure with AVR and CABG by Dr. Donata Clay in April. Post-op course notable for atrial fibrillation and complete heart block requiring PPM among other issues detailed in the D/C summary. He is following in the Coumadin clinic, last INR 2.5.  He reports lower extremity edema, also a flare of gout since discharge. He was put on a steroid taper by Dr. Ouida Sills. He also called our office and I recommended that he continue his Lasix. He states that his edema has started to improve somewhat.  He is due to see Dr. Donata Clay on Monday with a chest x-ray. He states he has been walking every day within the house, does use a walker, has had no falls. No significant incisional pain, no drainage or fevers. States his appetite is fairly good.  Recent lab work showed potassium 5.3, BUN 37, creatinine 1.3. We stopped his potassium supplements.   No Known Allergies  Current Outpatient Prescriptions  Medication Sig Dispense Refill  . aspirin 81 MG tablet Take 81 mg by mouth daily.        . feeding supplement (ENSURE COMPLETE) LIQD Take 237 mLs by mouth 3 (three) times daily with meals.      . furosemide (LASIX) 40 MG tablet Take 1.5 tablets (60 mg total) by mouth daily.  45 tablet  3  . losartan (COZAAR) 25 MG tablet Take 1 tablet (25 mg total) by mouth daily.  30 tablet  1  . metoprolol succinate (TOPROL XL) 25 MG 24 hr tablet Take 1 tablet (25 mg total) by mouth daily.  30 tablet  3  . predniSONE (DELTASONE) 10 MG tablet Take 10 mg by mouth daily. Take 4 po for 3 days then 3 po for 3 days 2 po for 3 days 1 po for 3 days      . warfarin (COUMADIN) 5 MG tablet Take 1 tablet (5 mg total) by mouth 1 day or 1 dose. (Thursday evening 02/07/2012 only;then take Coumadin 2.5 mg po every evening or as directed by Dr. Ival Bible office.  30 tablet  1  . DISCONTD: furosemide (LASIX)  40 MG tablet Take 1 tablet (40 mg total) by mouth daily.  30 tablet  0    Past Medical History  Diagnosis Date  . Essential hypertension, benign   . DJD (degenerative joint disease)   . PMR (polymyalgia rheumatica)   . Gout   . Arthritis   . Aortic stenosis     Bioprosthetic AVR  . Hx of adenomatous colonic polyps   . Aneurysm of thoracic aorta     Ascending. 5.7 x 5.5cm - Bentall procedure  . Coronary atherosclerosis of native coronary artery     Multivessel - left internal mammary artery to    . H/O hiatal hernia   . Esophageal dysmotility   . Fibromyalgia   . Complete heart block     Status post pacemaker placement    Past Surgical History  Procedure Date  . Bilateral knee arthroscopy   . Appendectomy   . Hemorroidectomy   . Left inguinal hernia repair   . Hernia repair   . Back surgery     1965 or 1966 lumbar back surgery  . Bentall procedure 01/24/2012    Procedure: BENTALL PROCEDURE;  Surgeon: Kerin Perna, MD;  Location: [facility] OR;  Service: Open Heart Surgery;  Laterality: N/A;  .  Coronary artery bypass graft 01/24/2012    Procedure: CORONARY ARTERY BYPASS GRAFTING (CABG);  Surgeon: Kerin Perna, MD;  Location: [facility] OR;  Service: Open Heart Surgery;  Laterality: N/A;  . Pacemaker placement     Medtronic 4/13 - Dr. Johney Frame    Social History Douglas [name] reports that he has never smoked. He has never used smokeless tobacco. Douglas [name] reports that he does not drink alcohol.  Review of Systems Negative except as outlined above.  Physical Examination Filed Vitals:   02/21/12 1412  BP: 113/62  Pulse: 71  Resp: 16   Overweight elderly male in no acute distress. HEENT: Conjunctiva and lids normal, oropharynx clear. Neck: Supple, increased girth, no carotid bruits, no thyromegaly. Thorax: Stable pacer pocket site. Well healing midline sternal incision. Lungs: Clear to auscultation with decreased breath sounds at the bases, left worse than right, nonlabored  breathing at rest. Cardiac: Regular rate and rhythm, no S3, soft systolic murmur, no pericardial rub. Abdomen: Soft, nontender, bowel sounds present, no guarding or rebound. Extremities: 2-3+ edema. Skin: Warm and dry. Musculoskeletal: No kyphosis. Neuropsychiatric: Alert and oriented x3, affect grossly appropriate.   ECG Ventricular paced rhythm.    Problem List and Plan

## 2012-02-21 NOTE — Assessment & Plan Note (Signed)
He continues on Coumadin, followed in the Coumadin clinic with recent therapeutic INR.

## 2012-02-21 NOTE — Assessment & Plan Note (Signed)
Status post Bentall procedure in April with Dr. Donata Clay. Due for post surgical office followup next Monday.

## 2012-02-22 DIAGNOSIS — M159 Polyosteoarthritis, unspecified: Secondary | ICD-10-CM | POA: Diagnosis not present

## 2012-02-22 DIAGNOSIS — R1310 Dysphagia, unspecified: Secondary | ICD-10-CM | POA: Diagnosis not present

## 2012-02-22 DIAGNOSIS — I2510 Atherosclerotic heart disease of native coronary artery without angina pectoris: Secondary | ICD-10-CM | POA: Diagnosis not present

## 2012-02-22 DIAGNOSIS — I10 Essential (primary) hypertension: Secondary | ICD-10-CM | POA: Diagnosis not present

## 2012-02-22 DIAGNOSIS — M353 Polymyalgia rheumatica: Secondary | ICD-10-CM | POA: Diagnosis not present

## 2012-02-22 DIAGNOSIS — Z48812 Encounter for surgical aftercare following surgery on the circulatory system: Secondary | ICD-10-CM | POA: Diagnosis not present

## 2012-02-25 ENCOUNTER — Ambulatory Visit: Payer: Self-pay | Admitting: *Deleted

## 2012-02-25 ENCOUNTER — Ambulatory Visit (INDEPENDENT_AMBULATORY_CARE_PROVIDER_SITE_OTHER): Payer: Self-pay | Admitting: Physician Assistant

## 2012-02-25 ENCOUNTER — Other Ambulatory Visit: Payer: Self-pay | Admitting: Cardiothoracic Surgery

## 2012-02-25 ENCOUNTER — Ambulatory Visit
Admission: RE | Admit: 2012-02-25 | Discharge: 2012-02-25 | Disposition: A | Discharge status: Home / Self Care | Payer: Medicare Other | Source: Ambulatory Visit | Attending: Cardiothoracic Surgery | Admitting: Cardiothoracic Surgery

## 2012-02-25 ENCOUNTER — Encounter: Payer: Self-pay | Admitting: Physician Assistant

## 2012-02-25 VITALS — BP 99/60 | HR 69 | Resp 16 | Ht 69.0 in | Wt 227.6 lb

## 2012-02-25 DIAGNOSIS — I2510 Atherosclerotic heart disease of native coronary artery without angina pectoris: Secondary | ICD-10-CM

## 2012-02-25 DIAGNOSIS — J90 Pleural effusion, not elsewhere classified: Secondary | ICD-10-CM

## 2012-02-25 DIAGNOSIS — I4891 Unspecified atrial fibrillation: Secondary | ICD-10-CM

## 2012-02-25 DIAGNOSIS — R05 Cough: Secondary | ICD-10-CM | POA: Diagnosis not present

## 2012-02-25 DIAGNOSIS — D72829 Elevated white blood cell count, unspecified: Secondary | ICD-10-CM | POA: Diagnosis not present

## 2012-02-25 DIAGNOSIS — I359 Nonrheumatic aortic valve disorder, unspecified: Secondary | ICD-10-CM

## 2012-02-25 DIAGNOSIS — M353 Polymyalgia rheumatica: Secondary | ICD-10-CM | POA: Diagnosis not present

## 2012-02-25 DIAGNOSIS — M159 Polyosteoarthritis, unspecified: Secondary | ICD-10-CM | POA: Diagnosis not present

## 2012-02-25 DIAGNOSIS — R1310 Dysphagia, unspecified: Secondary | ICD-10-CM | POA: Diagnosis not present

## 2012-02-25 DIAGNOSIS — Z862 Personal history of diseases of the blood and blood-forming organs and certain disorders involving the immune mechanism: Secondary | ICD-10-CM

## 2012-02-25 DIAGNOSIS — Z7901 Long term (current) use of anticoagulants: Secondary | ICD-10-CM

## 2012-02-25 DIAGNOSIS — Z48812 Encounter for surgical aftercare following surgery on the circulatory system: Secondary | ICD-10-CM | POA: Diagnosis not present

## 2012-02-25 DIAGNOSIS — I712 Thoracic aortic aneurysm, without rupture: Secondary | ICD-10-CM

## 2012-02-25 DIAGNOSIS — Z954 Presence of other heart-valve replacement: Secondary | ICD-10-CM

## 2012-02-25 DIAGNOSIS — I10 Essential (primary) hypertension: Secondary | ICD-10-CM | POA: Diagnosis not present

## 2012-02-25 DIAGNOSIS — R918 Other nonspecific abnormal finding of lung field: Secondary | ICD-10-CM | POA: Diagnosis not present

## 2012-02-25 NOTE — Progress Notes (Addendum)
HPI:  Patient returns for routine postoperative follow-up having undergone a Bentall and CABGx3 by Dr. Donata Clay on 01/25/2012. The patient's early postoperative recovery while in the hospital was notable for complete heart block. He required a PPM and this was placed by Dr. Johney Frame on 02/01/2012. Since hospital discharge, the patient reports increased swelling of his lower extremities, gout of his right ankle (on prednisone for this), difficulty sleeping, and some numbness and swelling of his last fingers on his right hand.   Current Outpatient Prescriptions  Medication Sig Dispense Refill  . aspirin 81 MG tablet Take 81 mg by mouth daily.        . feeding supplement (ENSURE COMPLETE) LIQD Take 237 mLs by mouth 3 (three) times daily with meals.      . furosemide (LASIX) 40 MG tablet Take 1.5 tablets (60 mg total) by mouth daily.  45 tablet  3  . losartan (COZAAR) 25 MG tablet Take 1 tablet (25 mg total) by mouth daily.  30 tablet  1  . metoprolol succinate (TOPROL XL) 25 MG 24 hr tablet Take 1 tablet (25 mg total) by mouth daily.  30 tablet  3  . predniSONE (DELTASONE) 10 MG tablet Take 10 mg by mouth daily. Take 4 po for 3 days then 3 po for 3 days 2 po for 3 days 1 po for 3 days      . warfarin (COUMADIN) 5 MG tablet Take 1 tablet (5 mg total) by mouth 1 day or 1 dose. (Thursday evening 02/07/2012 only;then take Coumadin 2.5 mg po every evening or as directed by Dr. Ival Bible office.  30 tablet  1  Vital Signs: BP 99/60, HR 69, RR 16, and O2 sat 95% on room air  Physical Exam: Cardiovascular:RRR;no murmur Pulmonary:Clear on right;diminished on left base Abdomen:Soft, non tender, bowel sounds present. Extremities:2+ lower extremity edema. Wounds:Clean and dry; no signs of infection.  Diagnostic Tests: PA/LAT CXR done today showed an increase in opacity of the left lung base (consistent with increase in left pleural effusion and basilar atelectasis), small right pleural effusion, and no  pneumothorax.  Impression and Plan: Patient has been seen by the cardiologist (Dr. Diona Browner). His Lasix was increased to 60 mg by mouth daily, as he had increased lower extremity edema. His last INR was drawn this morning and was found to be 2.8. Coumadin is being managed by Dr. Diona Browner.  As stated above, his chest x-ray shows an increase in the opacity at the left base (likely an increase in the left pleural effusion and basilar atelectasis). As discussed with the surgeon,he was instructed not to take any Coumadin  tonight, Tuesday, Wednesday , or Thursday night. He is going to be scheduled for a left thoracentesis. This will be done via ultrasound as an outpatient at [facility] on Thursday 02/28/2012. Prior to undergoing this procedure, a PT and INR to be drawn in the morning. He was then instructed he may begin taking his Coumadin on Friday evening. He does have an appointment next week to have his PT/INR drawn again. He states that he is swallowing without difficulty. He requested to stop taking Ensure, as he is tolerating a diet and he was told that was ok. He requested to take Tylenol PM PRN at bedtime as he has had difficulty sleeping. He was instructed that he may do so. He was also instructed to continue with sternal precautions in (i.e. no lifting more than 10 pounds) until instructed otherwise.  Dr. Ouida Sills,  his medical doctor, has put him on prednisone taper, as he had an acute gout attack in his right ankle. He is almost finished with this. The patient inquired whether or not he is able to take indomethacin at this time. I instructed him not to do so. He may receive a prescription for either allopurinol or colchicine for future gout attacks from Dr. Ouida Sills. Regarding the numbness and swelling of his right hand, he did state that these symptoms have continued to improve.He has strong ulnar and radial pulses. Possibly related to the position at time of surgery. It should be noted that a CBC  drawn today shows his H/H to be 11.7/36.8 and his white blood cell count is down to 11,200. He is scheduled to have a BMET this week (last creatinine was 1.3). Finally, his blood pressure is labile on this visit. He may need to stop his Cozaar if it is low on the next office visit and/or he becomes symptomatic. He will return to see [place] in follow up Monday 03/03/2012.

## 2012-02-26 DIAGNOSIS — M159 Polyosteoarthritis, unspecified: Secondary | ICD-10-CM | POA: Diagnosis not present

## 2012-02-26 DIAGNOSIS — I2510 Atherosclerotic heart disease of native coronary artery without angina pectoris: Secondary | ICD-10-CM | POA: Diagnosis not present

## 2012-02-26 DIAGNOSIS — R1310 Dysphagia, unspecified: Secondary | ICD-10-CM | POA: Diagnosis not present

## 2012-02-26 DIAGNOSIS — Z48812 Encounter for surgical aftercare following surgery on the circulatory system: Secondary | ICD-10-CM | POA: Diagnosis not present

## 2012-02-26 DIAGNOSIS — I10 Essential (primary) hypertension: Secondary | ICD-10-CM | POA: Diagnosis not present

## 2012-02-26 DIAGNOSIS — M353 Polymyalgia rheumatica: Secondary | ICD-10-CM | POA: Diagnosis not present

## 2012-02-27 DIAGNOSIS — Z48812 Encounter for surgical aftercare following surgery on the circulatory system: Secondary | ICD-10-CM | POA: Diagnosis not present

## 2012-02-27 DIAGNOSIS — R1310 Dysphagia, unspecified: Secondary | ICD-10-CM | POA: Diagnosis not present

## 2012-02-27 DIAGNOSIS — I2510 Atherosclerotic heart disease of native coronary artery without angina pectoris: Secondary | ICD-10-CM | POA: Diagnosis not present

## 2012-02-27 DIAGNOSIS — M353 Polymyalgia rheumatica: Secondary | ICD-10-CM | POA: Diagnosis not present

## 2012-02-27 DIAGNOSIS — M159 Polyosteoarthritis, unspecified: Secondary | ICD-10-CM | POA: Diagnosis not present

## 2012-02-27 DIAGNOSIS — I10 Essential (primary) hypertension: Secondary | ICD-10-CM | POA: Diagnosis not present

## 2012-02-28 ENCOUNTER — Ambulatory Visit (HOSPITAL_COMMUNITY)
Admission: RE | Admit: 2012-02-28 | Discharge: 2012-02-28 | Disposition: A | Discharge status: Home / Self Care | Payer: Medicare Other | Source: Ambulatory Visit | Attending: Cardiothoracic Surgery | Admitting: Cardiothoracic Surgery

## 2012-02-28 ENCOUNTER — Ambulatory Visit (HOSPITAL_COMMUNITY)
Admission: RE | Admit: 2012-02-28 | Discharge: 2012-02-28 | Disposition: A | Discharge status: Home / Self Care | Payer: Medicare Other | Source: Ambulatory Visit | Attending: Radiology | Admitting: Radiology

## 2012-02-28 VITALS — BP 110/59

## 2012-02-28 DIAGNOSIS — J90 Pleural effusion, not elsewhere classified: Secondary | ICD-10-CM | POA: Insufficient documentation

## 2012-02-28 DIAGNOSIS — R918 Other nonspecific abnormal finding of lung field: Secondary | ICD-10-CM | POA: Diagnosis not present

## 2012-02-28 NOTE — Procedures (Signed)
Lt US guided thoracentesis 2 liters blood tinged fluid removed  cxr pending Tolerated well No labs sent per MD

## 2012-02-29 DIAGNOSIS — Z48812 Encounter for surgical aftercare following surgery on the circulatory system: Secondary | ICD-10-CM | POA: Diagnosis not present

## 2012-02-29 DIAGNOSIS — I10 Essential (primary) hypertension: Secondary | ICD-10-CM | POA: Diagnosis not present

## 2012-02-29 DIAGNOSIS — I2510 Atherosclerotic heart disease of native coronary artery without angina pectoris: Secondary | ICD-10-CM | POA: Diagnosis not present

## 2012-02-29 DIAGNOSIS — R1310 Dysphagia, unspecified: Secondary | ICD-10-CM | POA: Diagnosis not present

## 2012-02-29 DIAGNOSIS — M353 Polymyalgia rheumatica: Secondary | ICD-10-CM | POA: Diagnosis not present

## 2012-02-29 DIAGNOSIS — M159 Polyosteoarthritis, unspecified: Secondary | ICD-10-CM | POA: Diagnosis not present

## 2012-03-03 ENCOUNTER — Ambulatory Visit (INDEPENDENT_AMBULATORY_CARE_PROVIDER_SITE_OTHER): Payer: Self-pay | Admitting: Physician Assistant

## 2012-03-03 ENCOUNTER — Other Ambulatory Visit: Payer: Self-pay | Admitting: Cardiothoracic Surgery

## 2012-03-03 ENCOUNTER — Ambulatory Visit
Admission: RE | Admit: 2012-03-03 | Discharge: 2012-03-03 | Disposition: A | Discharge status: Home / Self Care | Payer: Medicare Other | Source: Ambulatory Visit | Attending: Cardiothoracic Surgery | Admitting: Cardiothoracic Surgery

## 2012-03-03 VITALS — BP 120/64 | HR 76 | Resp 16 | Ht 69.0 in | Wt 219.0 lb

## 2012-03-03 DIAGNOSIS — I359 Nonrheumatic aortic valve disorder, unspecified: Secondary | ICD-10-CM

## 2012-03-03 DIAGNOSIS — Z951 Presence of aortocoronary bypass graft: Secondary | ICD-10-CM | POA: Diagnosis not present

## 2012-03-03 DIAGNOSIS — J90 Pleural effusion, not elsewhere classified: Secondary | ICD-10-CM

## 2012-03-03 DIAGNOSIS — I2510 Atherosclerotic heart disease of native coronary artery without angina pectoris: Secondary | ICD-10-CM

## 2012-03-03 DIAGNOSIS — I712 Thoracic aortic aneurysm, without rupture, unspecified: Secondary | ICD-10-CM

## 2012-03-03 MED ORDER — MOXIFLOXACIN HCL 400 MG PO TABS: 400.0000 mg | ORAL_TABLET | Freq: Every day | ORAL | Status: DC

## 2012-03-03 NOTE — Progress Notes (Signed)
  HPI:  [name] presents for follow up.  He is S/P CABG x3 with a Bentall Procedure performed 01/25/2012.  He was last seen by Douglas Hiss PA-C on 02/25/2012 at which time the patient was found to have significant LE edema and a large left sided pleural effusion.  He was given lasix by his Cardiologist, but required US guided drainage of the effusion.  This was performed on 02/28/2012 at which time 2L of serosanguinous fluid was evacuated.  The patient states he is doing better.  He continues to take Lasix with improvement in his LE edema.  He continues to be somewhat short of breath, but he states this has improved significantly since his last visit.  He does complain of being febrile the past 2 days with some sputum production.    Current Outpatient Prescriptions  Medication Sig Dispense Refill  . aspirin 81 MG tablet Take 81 mg by mouth daily.        . feeding supplement (ENSURE COMPLETE) LIQD Take 237 mLs by mouth 3 (three) times daily with meals.      . furosemide (LASIX) 40 MG tablet Take 1.5 tablets (60 mg total) by mouth daily.  45 tablet  3  . losartan (COZAAR) 25 MG tablet Take 1 tablet (25 mg total) by mouth daily.  30 tablet  1  . metoprolol succinate (TOPROL XL) 25 MG 24 hr tablet Take 1 tablet (25 mg total) by mouth daily.  30 tablet  3  . warfarin (COUMADIN) 5 MG tablet Take 1 tablet (5 mg total) by mouth 1 day or 1 dose. (Thursday evening 02/07/2012 only;then take Coumadin 2.5 mg po every evening or as directed by Dr. Ival [name] office.  30 tablet  1  . moxifloxacin (AVELOX) 400 MG tablet Take 1 tablet (400 mg total) by mouth daily.  7 tablet  0    Physical Exam:  Gen: no apparent distress Lungs: diminished right base Heart: RRR Ext: bilateral LE edema, 2+  Diagnostic Tests:  CXR: small re accumulation of left pleural fluid, opacity left lung base ?possible pneumonia?  Impression:  Douglas [name] is S/P US Thoracentesis for left pleural effusion.  Patient states he feels  better, however there is re-accumulation of fluid on the left side on today's chest xray.  There is also a concern for pneumonia with patient being febrile and productive cough.  Plan:  1. Left sided pleural effusion- re-accumulating since thoracentesis performed 5/9, will repeat CXR in one week, continue Lasix for now 2. Possible Pneumonia- gave Avelox 400mg  one tablet daily for 7 days 3. Peripheral edema- improving, will continue Lasix 4. RTC in one week with Douglas [name]

## 2012-03-05 ENCOUNTER — Ambulatory Visit: Payer: Self-pay | Admitting: *Deleted

## 2012-03-05 DIAGNOSIS — M159 Polyosteoarthritis, unspecified: Secondary | ICD-10-CM | POA: Diagnosis not present

## 2012-03-05 DIAGNOSIS — I2510 Atherosclerotic heart disease of native coronary artery without angina pectoris: Secondary | ICD-10-CM | POA: Diagnosis not present

## 2012-03-05 DIAGNOSIS — Z7901 Long term (current) use of anticoagulants: Secondary | ICD-10-CM

## 2012-03-05 DIAGNOSIS — M353 Polymyalgia rheumatica: Secondary | ICD-10-CM | POA: Diagnosis not present

## 2012-03-05 DIAGNOSIS — Z954 Presence of other heart-valve replacement: Secondary | ICD-10-CM

## 2012-03-05 DIAGNOSIS — I10 Essential (primary) hypertension: Secondary | ICD-10-CM | POA: Diagnosis not present

## 2012-03-05 DIAGNOSIS — Z48812 Encounter for surgical aftercare following surgery on the circulatory system: Secondary | ICD-10-CM | POA: Diagnosis not present

## 2012-03-05 DIAGNOSIS — I4891 Unspecified atrial fibrillation: Secondary | ICD-10-CM

## 2012-03-05 DIAGNOSIS — R1310 Dysphagia, unspecified: Secondary | ICD-10-CM | POA: Diagnosis not present

## 2012-03-05 MED ORDER — WARFARIN SODIUM 5 MG PO TABS: 5.0000 mg | ORAL_TABLET | Freq: Every day | ORAL | Status: DC

## 2012-03-05 MED ORDER — FUROSEMIDE 40 MG PO TABS: 60.0000 mg | ORAL_TABLET | Freq: Every day | ORAL | Status: DC

## 2012-03-07 ENCOUNTER — Other Ambulatory Visit: Payer: Self-pay | Admitting: Cardiology

## 2012-03-07 ENCOUNTER — Other Ambulatory Visit: Payer: Self-pay | Admitting: Cardiothoracic Surgery

## 2012-03-07 DIAGNOSIS — I2510 Atherosclerotic heart disease of native coronary artery without angina pectoris: Secondary | ICD-10-CM

## 2012-03-07 MED ORDER — FUROSEMIDE 40 MG PO TABS: 60.0000 mg | ORAL_TABLET | Freq: Every day | ORAL | Status: DC

## 2012-03-07 NOTE — Telephone Encounter (Signed)
PT IS OUT

## 2012-03-11 ENCOUNTER — Other Ambulatory Visit: Payer: Self-pay | Admitting: Cardiology

## 2012-03-11 ENCOUNTER — Encounter (HOSPITAL_COMMUNITY)
Admission: RE | Admit: 2012-03-11 | Discharge: 2012-03-11 | Disposition: A | Discharge status: Home / Self Care | Payer: Medicare Other | Source: Ambulatory Visit | Attending: Cardiology | Admitting: Cardiology

## 2012-03-11 ENCOUNTER — Encounter (HOSPITAL_COMMUNITY): Payer: Self-pay

## 2012-03-11 VITALS — BP 100/58 | HR 69 | Ht 68.0 in | Wt 227.7 lb

## 2012-03-11 DIAGNOSIS — I4891 Unspecified atrial fibrillation: Secondary | ICD-10-CM | POA: Diagnosis not present

## 2012-03-11 DIAGNOSIS — I25810 Atherosclerosis of coronary artery bypass graft(s) without angina pectoris: Secondary | ICD-10-CM

## 2012-03-11 DIAGNOSIS — Z954 Presence of other heart-valve replacement: Secondary | ICD-10-CM

## 2012-03-11 DIAGNOSIS — Z7901 Long term (current) use of anticoagulants: Secondary | ICD-10-CM | POA: Diagnosis not present

## 2012-03-11 LAB — PROTIME-INR: Prothrombin Time: 27.8 seconds — ABNORMAL HIGH (ref 11.6–15.2)

## 2012-03-11 NOTE — Progress Notes (Signed)
Pt was referred to Cardiac Rehab by Dr. Nona Dell post CABGx3 and Aortic Valve Replaced and Pacemaker done on 01/24/12. During orientation advised patient on arrival and appointment times what to wear, what to do before, during and after exercise. Reviewed attendance and class policy. Talked about inclement weather and class consultation policy. Pt is scheduled to start Cardiac Rehab on 03/24/12 at 9:30am. Pt was advised to come to class 5 minutes before class starts. He was also given instructions on meeting with the dietician and attending the Family Structure classes. Pt is eager to get started.

## 2012-03-11 NOTE — Patient Instructions (Signed)
Pt has finished orientation and is scheduled to start CR on 03/24/12 at 9:30/13. Pt has been instructed to arrive to class 15 minutes early for scheduled class. Pt has been instructed to wear comfortable clothing and shoes with rubber soles. Pt has been told to take their medications 1 hour prior to coming to class.  If the patient is not going to attend class, he/she has been instructed to call.

## 2012-03-12 ENCOUNTER — Ambulatory Visit: Payer: Self-pay | Admitting: *Deleted

## 2012-03-12 ENCOUNTER — Ambulatory Visit (INDEPENDENT_AMBULATORY_CARE_PROVIDER_SITE_OTHER): Payer: Self-pay | Admitting: Cardiothoracic Surgery

## 2012-03-12 ENCOUNTER — Ambulatory Visit
Admission: RE | Admit: 2012-03-12 | Discharge: 2012-03-12 | Disposition: A | Discharge status: Home / Self Care | Payer: Medicare Other | Source: Ambulatory Visit | Attending: Cardiothoracic Surgery | Admitting: Cardiothoracic Surgery

## 2012-03-12 ENCOUNTER — Encounter: Payer: Self-pay | Admitting: Cardiothoracic Surgery

## 2012-03-12 ENCOUNTER — Other Ambulatory Visit: Payer: Self-pay | Admitting: Cardiothoracic Surgery

## 2012-03-12 VITALS — BP 104/63 | HR 69 | Resp 16 | Ht 69.0 in | Wt 222.6 lb

## 2012-03-12 DIAGNOSIS — I2510 Atherosclerotic heart disease of native coronary artery without angina pectoris: Secondary | ICD-10-CM

## 2012-03-12 DIAGNOSIS — I4891 Unspecified atrial fibrillation: Secondary | ICD-10-CM

## 2012-03-12 DIAGNOSIS — J90 Pleural effusion, not elsewhere classified: Secondary | ICD-10-CM

## 2012-03-12 DIAGNOSIS — Z7901 Long term (current) use of anticoagulants: Secondary | ICD-10-CM

## 2012-03-12 DIAGNOSIS — I712 Thoracic aortic aneurysm, without rupture: Secondary | ICD-10-CM

## 2012-03-12 DIAGNOSIS — Z09 Encounter for follow-up examination after completed treatment for conditions other than malignant neoplasm: Secondary | ICD-10-CM

## 2012-03-12 DIAGNOSIS — Z951 Presence of aortocoronary bypass graft: Secondary | ICD-10-CM | POA: Diagnosis not present

## 2012-03-12 DIAGNOSIS — I350 Nonrheumatic aortic (valve) stenosis: Secondary | ICD-10-CM

## 2012-03-12 DIAGNOSIS — I359 Nonrheumatic aortic valve disorder, unspecified: Secondary | ICD-10-CM

## 2012-03-12 DIAGNOSIS — J984 Other disorders of lung: Secondary | ICD-10-CM | POA: Diagnosis not present

## 2012-03-12 DIAGNOSIS — Z954 Presence of other heart-valve replacement: Secondary | ICD-10-CM

## 2012-03-12 NOTE — Patient Instructions (Signed)
Stop Coumadin He may drive a car You may start rehabilitation at [facility] hospital June 3 Take the new fluid pill daily for 5 days only

## 2012-03-12 NOTE — Progress Notes (Signed)
PCP is Carylon Perches, MD, MD Referring Provider is Jonelle Sidle, MD                      [address] [place]            [place] [id number]          [phone]      Chief Complaint  Patient presents with  . Routine Post Op    1 week f/u Lt pleural effusion,S/P Bentall/CABG on 01/24/12     HPI: 6 weeks status post combined Bentall CABG x4 for large ascending aortic aneurysm and three-vessel CAD          Postoperative heart block requiring AV pacer           Postop left pleural effusion requiring thoracentesis 10 days ago of bloody fluid            Patient walking 1000 feet 4 times a day, incisions healing well, no bleeding complications noted other than the bloody pleural effusion   Past Medical History  Diagnosis Date  . Essential hypertension, benign   . DJD (degenerative joint disease)   . PMR (polymyalgia rheumatica)   . Gout   . Arthritis   . Aortic stenosis     Bioprosthetic AVR  . Hx of adenomatous colonic polyps   . Aneurysm of thoracic aorta     Ascending. 5.7 x 5.5cm - Bentall procedure  . Coronary atherosclerosis of native coronary artery     Multivessel - left internal mammary artery to    . H/O hiatal hernia   . Esophageal dysmotility   . Fibromyalgia   . Complete heart block     Status post pacemaker placement    Past Surgical History  Procedure Date  . Bilateral knee arthroscopy   . Appendectomy   . Hemorroidectomy   . Left inguinal hernia repair   . Hernia repair   . Back surgery     1965 or 1966 lumbar back surgery  . Bentall procedure 01/24/2012    Procedure: BENTALL PROCEDURE;  Surgeon: Kerin Perna, MD;  Location: [facility] OR;  Service: Open Heart Surgery;  Laterality: N/A;  . Coronary artery bypass graft 01/24/2012    Procedure: CORONARY ARTERY BYPASS GRAFTING (CABG);  Surgeon: Kerin Perna, MD;  Location: [facility] OR;  Service: Open Heart Surgery;  Laterality: N/A;  . Pacemaker placement     Medtronic 4/13 - Dr. Johney Frame  . Aortic valve  replacement 01/24/12    Family History  Problem Relation Age of Onset  . Kidney disease Mother   . Heart disease Father   . Anesthesia problems Neg Hx   . Hypotension Neg Hx   . Malignant hyperthermia Neg Hx   . Pseudochol deficiency Neg Hx     Social History History  Substance Use Topics  . Smoking status: Never Smoker   . Smokeless tobacco: Never Used  . Alcohol Use: No    Current Outpatient Prescriptions  Medication Sig Dispense Refill  . aspirin 81 MG tablet Take 81 mg by mouth daily.        . feeding supplement (ENSURE COMPLETE) LIQD Take 237 mLs by mouth 3 (three) times daily with meals.      . furosemide (LASIX) 40 MG tablet       . losartan (COZAAR) 25 MG tablet Take 1 tablet (25 mg total) by mouth daily.  30 tablet  1  . metoprolol succinate (TOPROL XL) 25 MG  24 hr tablet Take 1 tablet (25 mg total) by mouth daily.  30 tablet  3  . warfarin (COUMADIN) 5 MG tablet Take 1 tablet (5 mg total) by mouth daily. (Thursday evening 02/07/2012 only;then take Coumadin 2.5 mg po every evening or as directed by Dr. Ival Bible office.  30 tablet  3  . warfarin (COUMADIN) 5 MG tablet Take 1 tablet (5 mg total) by mouth daily. (Thursday evening 02/07/2012 only;then take Coumadin 2.5 mg po every evening or as directed by Dr. Ival Bible office.  30 tablet  3  . DISCONTD: LASIX 40 MG tablet TAKE ONE TABLET DAILY.  30 each  6    No Known Allergies  Review of Systems no fever weight stable getting stronger  BP 104/63  Pulse 69  Resp 16  Ht 5\' 9"  (1.753 m)  Wt 222 lb 9.6 oz (100.971 kg)  BMI 32.87 kg/m2  SpO2 94% Physical Exam Alert and oriented Diminished breath sounds on the left no murmur cardiac rhythm regular Mild ankle edema right greater than left  Diagnostic Tests: Chest x-ray with some reaccumulation left pleural effusion  Impression: Patient progressing slowly and will be ready to start outpatient rehabilitation at [facility] in early June. Pleural effusion is slowly  recurring. This was bloody at the time of thoracentesis. We'll stop the Coumadin, continue Lasix, and given a short course of oral Zaroxolyn. Patient mildly dyspneic with exertion resting sat 94% down from 97% on last visit  Plan: Return for followup in 10 days May 31 with chest x-ray to assess need for thoracentesis

## 2012-03-13 ENCOUNTER — Encounter: Payer: Self-pay | Admitting: Cardiothoracic Surgery

## 2012-03-20 ENCOUNTER — Ambulatory Visit (INDEPENDENT_AMBULATORY_CARE_PROVIDER_SITE_OTHER): Payer: Medicare Other | Admitting: Cardiology

## 2012-03-20 ENCOUNTER — Other Ambulatory Visit: Payer: Self-pay

## 2012-03-20 ENCOUNTER — Encounter: Payer: Self-pay | Admitting: Cardiology

## 2012-03-20 VITALS — BP 94/58 | HR 69 | Resp 18 | Ht 68.0 in | Wt 216.0 lb

## 2012-03-20 DIAGNOSIS — J90 Pleural effusion, not elsewhere classified: Secondary | ICD-10-CM | POA: Diagnosis not present

## 2012-03-20 DIAGNOSIS — I10 Essential (primary) hypertension: Secondary | ICD-10-CM | POA: Diagnosis not present

## 2012-03-20 DIAGNOSIS — M109 Gout, unspecified: Secondary | ICD-10-CM | POA: Diagnosis not present

## 2012-03-20 DIAGNOSIS — I2510 Atherosclerotic heart disease of native coronary artery without angina pectoris: Secondary | ICD-10-CM | POA: Diagnosis not present

## 2012-03-20 MED ORDER — COLCHICINE 0.6 MG PO TABS: 0.6000 mg | ORAL_TABLET | Freq: Two times a day (BID) | ORAL | Status: DC | PRN

## 2012-03-20 MED ORDER — COLCHICINE 0.6 MG PO TABS: 0.6000 mg | ORAL_TABLET | Freq: Two times a day (BID) | ORAL | Status: DC

## 2012-03-20 NOTE — Assessment & Plan Note (Signed)
No active angina 

## 2012-03-20 NOTE — Progress Notes (Signed)
Clinical Summary Douglas [name] is a 78 y.o.male presenting for followup. He was seen in early May. He had interval follow up with Dr. Donata Clay just recently, status post left thoracentesis due to large effusion. He was taken off of Coumadin at that time since his effusion had been bloody.  CAD he is here with his wife. Still feels weak and fatigued. Has not started cardiac rehabilitation yet.  We discussed his blood pressure which has generally been on the low side.  He also complains of intermittent gout flares.  No Known Allergies  Current Outpatient Prescriptions  Medication Sig Dispense Refill  . aspirin 81 MG tablet Take 81 mg by mouth daily.        . feeding supplement (ENSURE COMPLETE) LIQD Take 237 mLs by mouth 3 (three) times daily with meals.      . furosemide (LASIX) 40 MG tablet Take 40 mg by mouth as directed. 60mg  daily        . metoprolol succinate (TOPROL XL) 25 MG 24 hr tablet Take 1 tablet (25 mg total) by mouth daily.  30 tablet  3  . colchicine 0.6 MG tablet Take 1 tablet (0.6 mg total) by mouth 2 (two) times daily. Take 1 tablet once to twice daily  60 tablet  3    Past Medical History  Diagnosis Date  . Essential hypertension, benign   . DJD (degenerative joint disease)   . PMR (polymyalgia rheumatica)   . Gout   . Arthritis   . Aortic stenosis     Bioprosthetic AVR  . Hx of adenomatous colonic polyps   . Aneurysm of thoracic aorta     Ascending. 5.7 x 5.5cm - Bentall procedure  . Coronary atherosclerosis of native coronary artery     Multivessel - left internal mammary artery to    . H/O hiatal hernia   . Esophageal dysmotility   . Fibromyalgia   . Complete heart block     Status post pacemaker placement    Past Surgical History  Procedure Date  . Bilateral knee arthroscopy   . Appendectomy   . Hemorroidectomy   . Left inguinal hernia repair   . Hernia repair   . Back surgery     1965 or 1966 lumbar back surgery  . Bentall procedure  01/24/2012    Procedure: BENTALL PROCEDURE;  Surgeon: Kerin Perna, MD;  Location: [facility] OR;  Service: Open Heart Surgery;  Laterality: N/A;  . Coronary artery bypass graft 01/24/2012    Procedure: CORONARY ARTERY BYPASS GRAFTING (CABG);  Surgeon: Kerin Perna, MD;  Location: [facility] OR;  Service: Open Heart Surgery;  Laterality: N/A;  . Pacemaker placement     Medtronic 4/13 - Dr. Johney Frame  . Aortic valve replacement 01/24/12    Social History [name] reports that he has never smoked. He has never used smokeless tobacco. [name] reports that he does not drink alcohol.  Review of Systems No palpitations. No bleeding problems. Still with some shortness of breath and fatigue as outlined. No chest pain. No orthopnea or PND. Otherwise negative.  Physical Examination Filed Vitals:   03/20/12 1406  BP: 94/58  Pulse: 69  Resp: 18   Overweight elderly male in no acute distress.  HEENT: Conjunctiva and lids normal, oropharynx clear.  Neck: Supple, increased girth, no carotid bruits, no thyromegaly.  Thorax: Stable pacer pocket site. Well healing midline sternal incision.  Lungs: Decreased breath sounds, left worse than right approximately halfway up, nonlabored  breathing at rest.  Cardiac: Regular rate and rhythm, no S3, soft systolic murmur, no pericardial rub.  Abdomen: Soft, nontender, bowel sounds present, no guarding or rebound.  Extremities: 2+ edema.    Problem List and Plan   Essential hypertension, benign Blood pressure actually more on the low side. I asked him to stop Cozaar for now, perhaps she will feel less weak. We will see how his blood pressure trend does in cardiac rehabilitation. Otherwise stay on beta blocker.  Gout Prescription given for colchicine  Coronary atherosclerosis of native coronary artery No active angina.  Pleural effusion Status post left thoracentesis. Off Coumadin now. Examination suggests recurrence. He is due for a chest x-ray tomorrow and  followup with [facility].     Jonelle Sidle, M.D., F.A.C.C.

## 2012-03-20 NOTE — Patient Instructions (Addendum)
**Note De-Identified Douglas [name] Obfuscation** Your physician has recommended you make the following change in your medication: stop taking Cozaar and start taking colchicine 0.6 mg (take 1 tablet once to twice daily as needed for gout)  Your physician recommends that you schedule a follow-up appointment in: 1 month

## 2012-03-20 NOTE — Assessment & Plan Note (Signed)
Status post left thoracentesis. Off Coumadin now. Examination suggests recurrence. He is due for a chest x-ray tomorrow and followup with [facility].

## 2012-03-20 NOTE — Assessment & Plan Note (Signed)
Prescription given for colchicine

## 2012-03-20 NOTE — Assessment & Plan Note (Signed)
Blood pressure actually more on the low side. I asked him to stop Cozaar for now, perhaps she will feel less weak. We will see how his blood pressure trend does in cardiac rehabilitation. Otherwise stay on beta blocker.

## 2012-03-21 ENCOUNTER — Encounter: Payer: Self-pay | Admitting: Cardiothoracic Surgery

## 2012-03-21 ENCOUNTER — Ambulatory Visit
Admission: RE | Admit: 2012-03-21 | Discharge: 2012-03-21 | Disposition: A | Discharge status: Home / Self Care | Payer: Medicare Other | Source: Ambulatory Visit | Attending: Cardiothoracic Surgery | Admitting: Cardiothoracic Surgery

## 2012-03-21 ENCOUNTER — Ambulatory Visit (INDEPENDENT_AMBULATORY_CARE_PROVIDER_SITE_OTHER): Payer: Self-pay | Admitting: Cardiothoracic Surgery

## 2012-03-21 VITALS — BP 98/56 | HR 70 | Resp 20 | Ht 69.0 in | Wt 210.0 lb

## 2012-03-21 DIAGNOSIS — I712 Thoracic aortic aneurysm, without rupture, unspecified: Secondary | ICD-10-CM

## 2012-03-21 DIAGNOSIS — Z951 Presence of aortocoronary bypass graft: Secondary | ICD-10-CM | POA: Diagnosis not present

## 2012-03-21 DIAGNOSIS — J90 Pleural effusion, not elsewhere classified: Secondary | ICD-10-CM

## 2012-03-21 DIAGNOSIS — J9819 Other pulmonary collapse: Secondary | ICD-10-CM | POA: Diagnosis not present

## 2012-03-21 DIAGNOSIS — I359 Nonrheumatic aortic valve disorder, unspecified: Secondary | ICD-10-CM

## 2012-03-21 DIAGNOSIS — Z09 Encounter for follow-up examination after completed treatment for conditions other than malignant neoplasm: Secondary | ICD-10-CM

## 2012-03-21 DIAGNOSIS — I2510 Atherosclerotic heart disease of native coronary artery without angina pectoris: Secondary | ICD-10-CM

## 2012-03-21 NOTE — Progress Notes (Signed)
PCP is Carylon Perches, MD, MD Referring Provider is Jonelle Sidle, MD  Chief Complaint  Patient presents with  . Follow-up    1 week f/u with CXR, Left Pleural effusion, S/P Bentall/CABG on 01/24/12                                                 [address]            [name] [address]          [phone]       HPI: The patient is an 78 year old gentleman status post biologic-Bentall -CABG early April 2013. Any permanent pacemaker placed for bradycardia. He has had a postoperative left pleural effusion requiring thoracentesis x1. He is scheduled to begin hospital based cardiac rehabilitation at any [facility] hospital next week. He is driving walking around [place]. He was recently given a course of extra Zaroxolyn for persistent left effusion which has improved his chest x-ray, improved his breathing, and has improved his chronic dry cough. His weight is decreased from 212to 210 pounds following extra diuresis.   Past Medical History  Diagnosis Date  . Essential hypertension, benign   . DJD (degenerative joint disease)   . PMR (polymyalgia rheumatica)   . Gout   . Arthritis   . Aortic stenosis     Bioprosthetic AVR  . Hx of adenomatous colonic polyps   . Aneurysm of thoracic aorta     Ascending. 5.7 x 5.5cm - Bentall procedure  . Coronary atherosclerosis of native coronary artery     Multivessel - left internal mammary artery to    . H/O hiatal hernia   . Esophageal dysmotility   . Fibromyalgia   . Complete heart block     Status post pacemaker placement    Past Surgical History  Procedure Date  . Bilateral knee arthroscopy   . Appendectomy   . Hemorroidectomy   . Left inguinal hernia repair   . Hernia repair   . Back surgery     1965 or 1966 lumbar back surgery  . Bentall procedure 01/24/2012    Procedure: BENTALL PROCEDURE;  Surgeon: Kerin Perna, MD;  Location: [facility] OR;  Service: Open Heart Surgery;  Laterality: N/A;  . Coronary artery bypass graft  01/24/2012    Procedure: CORONARY ARTERY BYPASS GRAFTING (CABG);  Surgeon: Kerin Perna, MD;  Location: [facility] OR;  Service: Open Heart Surgery;  Laterality: N/A;  . Pacemaker placement     Medtronic 4/13 - Dr. Johney Frame  . Aortic valve replacement 01/24/12    Family History  Problem Relation Age of Onset  . Kidney disease Mother   . Heart disease Father   . Anesthesia problems Neg Hx   . Hypotension Neg Hx   . Malignant hyperthermia Neg Hx   . Pseudochol deficiency Neg Hx     Social History History  Substance Use Topics  . Smoking status: Never Smoker   . Smokeless tobacco: Never Used  . Alcohol Use: No    Current Outpatient Prescriptions  Medication Sig Dispense Refill  . aspirin 81 MG tablet Take 81 mg by mouth daily.        . colchicine 0.6 MG tablet Take 1 tablet (0.6 mg total) by mouth 2 (two) times daily as needed. Take 1 tablet once to twice daily  60 tablet  3  . feeding supplement (ENSURE COMPLETE) LIQD Take 237 mLs by mouth 3 (three) times daily with meals.      . furosemide (LASIX) 40 MG tablet Take 40 mg by mouth as directed. 60 mg total  daily        . metoprolol succinate (TOPROL XL) 25 MG 24 hr tablet Take 1 tablet (25 mg total) by mouth daily.  30 tablet  3  . senna (SENOKOT) 8.6 MG tablet Take 1 tablet by mouth as needed.        No Known Allergies  Review of Systems no fever appetite improved strength slowly improving  BP 98/56  Pulse 70  Resp 20  Ht 5\' 9"  (1.753 m)  Wt 210 lb (95.255 kg)  BMI 31.01 kg/m2  SpO2 98% Physical Exam Gen. alert and pleasant Lungs clear bilaterally Cardiac regular rhythm no gallop soft systolic flow murmur through aVR Extremities 1+ right ankle edema from saphenous vein harvest, neuro intact Diagnostic Tests: Chest x-ray today shows improved left pleural effusion which currently is only mild and probable posterior otherwise cardiac structures appear intact Impression: Slow improvement after bio-Bentall with combined  CABG  Plan: Return in one month with chest x-ray to followup left pleural effusion, continue current medications

## 2012-03-21 NOTE — Patient Instructions (Signed)
You may drive to cardiac rehabilitation at the hospital

## 2012-03-24 ENCOUNTER — Encounter (HOSPITAL_COMMUNITY)
Admission: RE | Admit: 2012-03-24 | Discharge: 2012-03-24 | Disposition: A | Discharge status: Home / Self Care | Payer: Medicare Other | Source: Ambulatory Visit | Attending: Cardiology | Admitting: Cardiology

## 2012-03-24 DIAGNOSIS — I2510 Atherosclerotic heart disease of native coronary artery without angina pectoris: Secondary | ICD-10-CM | POA: Insufficient documentation

## 2012-03-24 DIAGNOSIS — Z954 Presence of other heart-valve replacement: Secondary | ICD-10-CM | POA: Insufficient documentation

## 2012-03-24 DIAGNOSIS — Z951 Presence of aortocoronary bypass graft: Secondary | ICD-10-CM | POA: Insufficient documentation

## 2012-03-26 ENCOUNTER — Ambulatory Visit: Payer: Medicare Other | Admitting: Cardiothoracic Surgery

## 2012-03-26 ENCOUNTER — Encounter (HOSPITAL_COMMUNITY)
Admission: RE | Admit: 2012-03-26 | Discharge: 2012-03-26 | Disposition: A | Discharge status: Home / Self Care | Payer: Medicare Other | Source: Ambulatory Visit | Attending: Cardiology | Admitting: Cardiology

## 2012-03-28 ENCOUNTER — Ambulatory Visit: Payer: Medicare Other | Admitting: Cardiothoracic Surgery

## 2012-03-28 ENCOUNTER — Encounter (HOSPITAL_COMMUNITY)
Admission: RE | Admit: 2012-03-28 | Discharge: 2012-03-28 | Disposition: A | Discharge status: Home / Self Care | Payer: Medicare Other | Source: Ambulatory Visit | Attending: Cardiology | Admitting: Cardiology

## 2012-03-31 ENCOUNTER — Encounter (HOSPITAL_COMMUNITY)
Admission: RE | Admit: 2012-03-31 | Discharge: 2012-03-31 | Disposition: A | Discharge status: Home / Self Care | Payer: Medicare Other | Source: Ambulatory Visit | Attending: Cardiology | Admitting: Cardiology

## 2012-04-02 ENCOUNTER — Encounter (HOSPITAL_COMMUNITY)
Admission: RE | Admit: 2012-04-02 | Discharge: 2012-04-02 | Disposition: A | Discharge status: Home / Self Care | Payer: Medicare Other | Source: Ambulatory Visit | Attending: Cardiology | Admitting: Cardiology

## 2012-04-04 ENCOUNTER — Encounter (HOSPITAL_COMMUNITY)
Admission: RE | Admit: 2012-04-04 | Discharge: 2012-04-04 | Disposition: A | Discharge status: Home / Self Care | Payer: Medicare Other | Source: Ambulatory Visit | Attending: Cardiology | Admitting: Cardiology

## 2012-04-07 ENCOUNTER — Encounter (HOSPITAL_COMMUNITY)
Admission: RE | Admit: 2012-04-07 | Discharge: 2012-04-07 | Disposition: A | Discharge status: Home / Self Care | Payer: Medicare Other | Source: Ambulatory Visit | Attending: Cardiology | Admitting: Cardiology

## 2012-04-09 ENCOUNTER — Encounter (HOSPITAL_COMMUNITY): Payer: Medicare Other

## 2012-04-09 ENCOUNTER — Other Ambulatory Visit: Payer: Self-pay | Admitting: Cardiothoracic Surgery

## 2012-04-09 DIAGNOSIS — I2510 Atherosclerotic heart disease of native coronary artery without angina pectoris: Secondary | ICD-10-CM

## 2012-04-11 ENCOUNTER — Encounter (HOSPITAL_COMMUNITY)
Admission: RE | Admit: 2012-04-11 | Discharge: 2012-04-11 | Disposition: A | Discharge status: Home / Self Care | Payer: Medicare Other | Source: Ambulatory Visit | Attending: Cardiology | Admitting: Cardiology

## 2012-04-14 ENCOUNTER — Encounter (HOSPITAL_COMMUNITY)
Admission: RE | Admit: 2012-04-14 | Discharge: 2012-04-14 | Disposition: A | Discharge status: Home / Self Care | Payer: Medicare Other | Source: Ambulatory Visit | Attending: Cardiology | Admitting: Cardiology

## 2012-04-16 ENCOUNTER — Encounter (HOSPITAL_COMMUNITY)
Admission: RE | Admit: 2012-04-16 | Discharge: 2012-04-16 | Disposition: A | Discharge status: Home / Self Care | Payer: Medicare Other | Source: Ambulatory Visit | Attending: Cardiology | Admitting: Cardiology

## 2012-04-16 ENCOUNTER — Telehealth: Payer: Self-pay | Admitting: Internal Medicine

## 2012-04-16 ENCOUNTER — Ambulatory Visit
Admission: RE | Admit: 2012-04-16 | Discharge: 2012-04-16 | Disposition: A | Discharge status: Home / Self Care | Payer: Medicare Other | Source: Ambulatory Visit | Attending: Cardiothoracic Surgery | Admitting: Cardiothoracic Surgery

## 2012-04-16 ENCOUNTER — Ambulatory Visit (INDEPENDENT_AMBULATORY_CARE_PROVIDER_SITE_OTHER): Payer: Self-pay | Admitting: Cardiothoracic Surgery

## 2012-04-16 ENCOUNTER — Encounter: Payer: Self-pay | Admitting: Cardiothoracic Surgery

## 2012-04-16 VITALS — BP 107/70 | HR 71 | Resp 18 | Ht 69.0 in | Wt 215.0 lb

## 2012-04-16 DIAGNOSIS — I359 Nonrheumatic aortic valve disorder, unspecified: Secondary | ICD-10-CM

## 2012-04-16 DIAGNOSIS — Z09 Encounter for follow-up examination after completed treatment for conditions other than malignant neoplasm: Secondary | ICD-10-CM

## 2012-04-16 DIAGNOSIS — Z950 Presence of cardiac pacemaker: Secondary | ICD-10-CM

## 2012-04-16 DIAGNOSIS — J90 Pleural effusion, not elsewhere classified: Secondary | ICD-10-CM | POA: Diagnosis not present

## 2012-04-16 DIAGNOSIS — I712 Thoracic aortic aneurysm, without rupture, unspecified: Secondary | ICD-10-CM

## 2012-04-16 DIAGNOSIS — I2510 Atherosclerotic heart disease of native coronary artery without angina pectoris: Secondary | ICD-10-CM | POA: Diagnosis not present

## 2012-04-16 NOTE — Telephone Encounter (Signed)
Left message for patient.  Patient to be seen 6/27@4 :30.

## 2012-04-16 NOTE — Telephone Encounter (Signed)
New problem  Per Dr. Donata Clay wants someone to call patient to see how he doing. H/O pacer - Dula chamber

## 2012-04-16 NOTE — Progress Notes (Signed)
PCP is Carylon Perches, MD Referring Provider is Carylon Perches, MD                      [address] [place]            [place] [address]          [phone]      Chief Complaint  Patient presents with  . Routine Post Op    1 month f/u with CXR, Lt Pleural effusion, S/P Bentall/CABG on 01/24/12    HPI: The patient returns for followup after aVR-Bentall procedure with a tissue valve almost 3 months ago. He required a permanent pacemaker AV sequential for postoperative heart block prior to discharge. He is now in cardiac rehabilitation. He returns for a followup chest x-ray to assess a small left pleural effusion. He anxious to increase his activity level. He has noted over the past 2-3 days that his exercise tolerance is somewhat declined. On exam and review of his EKG rhythm strip it appears he may be having ventricular ectopy or atrial fibrillation or perhaps intermittent failure for his permanent pacemaker to capture. Otherwise he appears to be gradually improving. Past Medical History  Diagnosis Date  . Essential hypertension, benign   . DJD (degenerative joint disease)   . PMR (polymyalgia rheumatica)   . Gout   . Arthritis   . Aortic stenosis     Bioprosthetic AVR  . Hx of adenomatous colonic polyps   . Aneurysm of thoracic aorta     Ascending. 5.7 x 5.5cm - Bentall procedure  . Coronary atherosclerosis of native coronary artery     Multivessel - left internal mammary artery to    . H/O hiatal hernia   . Esophageal dysmotility   . Fibromyalgia   . Complete heart block     Status post pacemaker placement    Past Surgical History  Procedure Date  . Bilateral knee arthroscopy   . Appendectomy   . Hemorroidectomy   . Left inguinal hernia repair   . Hernia repair   . Back surgery     1965 or 1966 lumbar back surgery  . Bentall procedure 01/24/2012    Procedure: BENTALL PROCEDURE;  Surgeon: Kerin Perna, MD;  Location: [facility] OR;  Service: Open Heart Surgery;  Laterality:  N/A;  . Coronary artery bypass graft 01/24/2012    Procedure: CORONARY ARTERY BYPASS GRAFTING (CABG);  Surgeon: Kerin Perna, MD;  Location: [facility] OR;  Service: Open Heart Surgery;  Laterality: N/A;  . Pacemaker placement     Medtronic 4/13 - Dr. Johney Frame  . Aortic valve replacement 01/24/12    Family History  Problem Relation Age of Onset  . Kidney disease Mother   . Heart disease Father   . Anesthesia problems Neg Hx   . Hypotension Neg Hx   . Malignant hyperthermia Neg Hx   . Pseudochol deficiency Neg Hx     Social History History  Substance Use Topics  . Smoking status: Never Smoker   . Smokeless tobacco: Never Used  . Alcohol Use: No    Current Outpatient Prescriptions  Medication Sig Dispense Refill  . aspirin 81 MG tablet Take 81 mg by mouth daily.        . furosemide (LASIX) 40 MG tablet Take 40 mg by mouth as directed. 60 mg total  daily        . metoprolol succinate (TOPROL XL) 25 MG 24 hr tablet Take 1 tablet (25 mg  total) by mouth daily.  30 tablet  3  . senna (SENOKOT) 8.6 MG tablet Take 1 tablet by mouth as needed.        No Known Allergies  Review of Systems no fever incision is healed  BP 107/70  Pulse 71  Resp 18  Ht 5\' 9"  (1.753 m)  Wt 215 lb (97.523 kg)  BMI 31.75 kg/m2  SpO2 95% Physical Exam Alert and pleasant no distress Lungs slightly diminished breath sounds at left base otherwise clear Cardiac irregular rhythm no murmur no gallop  Extremities minimal ankle edema, improved  Diagnostic Tests: Chest x-ray shows minimal left pleural effusion -atelectasis of the left base which should improve with time and increased activity  Impression: Improved left pleural effusion no need for thoracentesis Irregular rhythm difficult to interpret with his terminal pacemaker. Suspect atrial fibrillation or PACs do to hypokalemia and we'll supplement with oral potassium. He takes Lasix daily. We'll also asked the device-pacemaker service to interrogate his  pacemaker to make sure it is sensing and capturing properly.  Plan: Return for surgical followup chest x-ray in 3 months.

## 2012-04-17 ENCOUNTER — Ambulatory Visit (INDEPENDENT_AMBULATORY_CARE_PROVIDER_SITE_OTHER): Payer: Medicare Other | Admitting: *Deleted

## 2012-04-17 ENCOUNTER — Encounter: Payer: Self-pay | Admitting: Internal Medicine

## 2012-04-17 DIAGNOSIS — I442 Atrioventricular block, complete: Secondary | ICD-10-CM

## 2012-04-17 NOTE — Progress Notes (Signed)
PPM check 

## 2012-04-18 ENCOUNTER — Encounter (HOSPITAL_COMMUNITY)
Admission: RE | Admit: 2012-04-18 | Discharge: 2012-04-18 | Disposition: A | Discharge status: Home / Self Care | Payer: Medicare Other | Source: Ambulatory Visit | Attending: Cardiology | Admitting: Cardiology

## 2012-04-21 ENCOUNTER — Encounter (HOSPITAL_COMMUNITY)
Admission: RE | Admit: 2012-04-21 | Discharge: 2012-04-21 | Disposition: A | Discharge status: Home / Self Care | Payer: Medicare Other | Source: Ambulatory Visit | Attending: Cardiology | Admitting: Cardiology

## 2012-04-21 DIAGNOSIS — I4891 Unspecified atrial fibrillation: Secondary | ICD-10-CM | POA: Diagnosis not present

## 2012-04-21 DIAGNOSIS — I712 Thoracic aortic aneurysm, without rupture, unspecified: Secondary | ICD-10-CM | POA: Insufficient documentation

## 2012-04-21 DIAGNOSIS — I10 Essential (primary) hypertension: Secondary | ICD-10-CM | POA: Insufficient documentation

## 2012-04-21 DIAGNOSIS — M109 Gout, unspecified: Secondary | ICD-10-CM | POA: Diagnosis not present

## 2012-04-21 DIAGNOSIS — I2510 Atherosclerotic heart disease of native coronary artery without angina pectoris: Secondary | ICD-10-CM | POA: Insufficient documentation

## 2012-04-21 DIAGNOSIS — I442 Atrioventricular block, complete: Secondary | ICD-10-CM | POA: Insufficient documentation

## 2012-04-21 DIAGNOSIS — Z5189 Encounter for other specified aftercare: Secondary | ICD-10-CM | POA: Insufficient documentation

## 2012-04-21 DIAGNOSIS — Z954 Presence of other heart-valve replacement: Secondary | ICD-10-CM | POA: Diagnosis not present

## 2012-04-22 ENCOUNTER — Ambulatory Visit (INDEPENDENT_AMBULATORY_CARE_PROVIDER_SITE_OTHER): Payer: Medicare Other | Admitting: Cardiology

## 2012-04-22 ENCOUNTER — Encounter: Payer: Self-pay | Admitting: Cardiology

## 2012-04-22 VITALS — BP 113/74 | HR 77 | Ht 68.0 in | Wt 215.0 lb

## 2012-04-22 DIAGNOSIS — J90 Pleural effusion, not elsewhere classified: Secondary | ICD-10-CM | POA: Diagnosis not present

## 2012-04-22 DIAGNOSIS — I4891 Unspecified atrial fibrillation: Secondary | ICD-10-CM | POA: Diagnosis not present

## 2012-04-22 DIAGNOSIS — Z954 Presence of other heart-valve replacement: Secondary | ICD-10-CM

## 2012-04-22 DIAGNOSIS — I2510 Atherosclerotic heart disease of native coronary artery without angina pectoris: Secondary | ICD-10-CM

## 2012-04-22 DIAGNOSIS — I10 Essential (primary) hypertension: Secondary | ICD-10-CM | POA: Diagnosis not present

## 2012-04-22 LAB — PACEMAKER DEVICE OBSERVATION
AL AMPLITUDE: 2 mv
BRDY-0004RA: 120 {beats}/min
VENTRICULAR PACING PM: 99

## 2012-04-22 NOTE — Assessment & Plan Note (Signed)
Symptomatically stable on medical therapy. Continue cardiac rehabilitation and observation.

## 2012-04-22 NOTE — Assessment & Plan Note (Addendum)
Improved at last evaluation with Dr. Donata Clay. Continue observation for now. He has followup arranged over the next few months with chest x-ray. We will keep him on diuretic therapy, although followup with [facility] in the next week to reassess potassium and renal function. He may either require a reduction in dose of Lasix, or institution of very low-dose potassium supplementation.

## 2012-04-22 NOTE — Assessment & Plan Note (Signed)
Blood pressure is well controlled 

## 2012-04-22 NOTE — Assessment & Plan Note (Signed)
Clinically stable. He will need endocarditis prophylaxis with dental work.

## 2012-04-22 NOTE — Patient Instructions (Addendum)
Your physician recommends that you continue on your current medications as directed. Please refer to the Current Medication list given to you today.  Your physician recommends that you return for lab work in: 1 week (April 29, 2012)  Your physician recommends that you schedule a follow-up appointment in: Coumadin Clinic this week and with provider in 6 weeks

## 2012-04-22 NOTE — Assessment & Plan Note (Signed)
Documented by recent pacer interrogation. Plan is to resume Coumadin with followup through our Coumadin clinic.

## 2012-04-22 NOTE — Progress Notes (Signed)
Clinical Summary Mr. Cicio is a 78 y.o.male presenting for followup. I saw him last back in May. He has had interval followup with Dr. Donata Clay, and a subsequent pacer interrogation that demonstrated slow atrial fibrillation.  He had improvement in his left pleural effusion at last assessment, has not required repeat thoracentesis. He continues with cardiac rehabilitation. Still describes dyspnea on exertion, although he is gradually improving. He has no sense of palpitations, has had no syncope.  We discussed his medications today. I also spoke with Dr. Donata Clay regarding resumption of Coumadin in light of atrial fibrillation.  He continues on diuretic therapy, no standing potassium supplement - stopped back in April with potassium up to 5.3.  No Known Allergies  Current Outpatient Prescriptions  Medication Sig Dispense Refill  . aspirin 81 MG tablet Take 81 mg by mouth daily.        . colchicine 0.6 MG tablet Take 0.6 mg by mouth as needed.      . furosemide (LASIX) 40 MG tablet Take 40 mg by mouth as directed. 60 mg total  daily        . metoprolol succinate (TOPROL XL) 25 MG 24 hr tablet Take 1 tablet (25 mg total) by mouth daily.  30 tablet  3  . senna (SENOKOT) 8.6 MG tablet Take 1 tablet by mouth as needed.        Past Medical History  Diagnosis Date  . Essential hypertension, benign   . DJD (degenerative joint disease)   . PMR (polymyalgia rheumatica)   . Gout   . Arthritis   . Aortic stenosis     Bioprosthetic AVR  . Hx of adenomatous colonic polyps   . Aneurysm of thoracic aorta     Ascending. 5.7 x 5.5cm - Bentall procedure  . Coronary atherosclerosis of native coronary artery     Multivessel - LIMA to LAD, SVG to OM, SVG to PDA  . H/O hiatal hernia   . Esophageal dysmotility   . Fibromyalgia   . Complete heart block     Status post pacemaker placement  . Atrial fibrillation     Documented on pacer interrogation 6/13    Past Surgical History  Procedure  Date  . Bilateral knee arthroscopy   . Appendectomy   . Hemorroidectomy   . Left inguinal hernia repair   . Hernia repair   . Back surgery     1965 or 1966 lumbar back surgery  . Bentall procedure 01/24/2012    Procedure: BENTALL PROCEDURE;  Surgeon: Kerin Perna, MD;  Location: [facility] OR;  Service: Open Heart Surgery;  Laterality: N/A;  . Coronary artery bypass graft 01/24/2012    Procedure: CORONARY ARTERY BYPASS GRAFTING (CABG);  Surgeon: Kerin Perna, MD;  Location: [facility] OR;  Service: Open Heart Surgery;  Laterality: N/A;  . Pacemaker placement     Medtronic 4/13 - Dr. Johney Frame  . Aortic valve replacement 01/24/12    Social History Mr. Rodriquez reports that he has never smoked. He has never used smokeless tobacco. Mr. Winegarden reports that he does not drink alcohol.  Review of Systems Negative except as outlined above.  Physical Examination Filed Vitals:   04/22/12 1355  BP: 113/74  Pulse: 77    Overweight elderly male in no acute distress.  HEENT: Conjunctiva and lids normal, oropharynx clear.  Neck: Supple, increased girth, no carotid bruits, no thyromegaly.  Thorax: Stable pacer pocket site. Well healing midline sternal incision.  Lungs: Decreased breath  sounds, nonlabored breathing at rest.  Cardiac: Regular rate and rhythm, no S3, soft systolic murmur, no pericardial rub.  Abdomen: Soft, nontender, bowel sounds present, no guarding or rebound.  Extremities: 1-2+ edema.    Problem List and Plan   Coronary atherosclerosis of native coronary artery Symptomatically stable on medical therapy. Continue cardiac rehabilitation and observation.  Atrial fibrillation Documented by recent pacer interrogation. Plan is to resume Coumadin with followup through our Coumadin clinic.  Pleural effusion Improved at last evaluation with Dr. Donata Clay. Continue observation for now. He has followup arranged over the next few months with chest x-ray. We will keep him on diuretic therapy,  although followup with BM[facility] in the next week to reassess potassium and renal function. He may either require a reduction in dose of Lasix, or institution of very low-dose potassium supplementation.  Heart valve replaced by other means Clinically stable. He will need endocarditis prophylaxis with dental work.  Essential hypertension, benign Blood pressure is well controlled.    Jonelle Sidle, M.D., F.A.C.C.

## 2012-04-23 ENCOUNTER — Encounter (HOSPITAL_COMMUNITY)
Admission: RE | Admit: 2012-04-23 | Discharge: 2012-04-23 | Disposition: A | Discharge status: Home / Self Care | Payer: Medicare Other | Source: Ambulatory Visit | Attending: Cardiology | Admitting: Cardiology

## 2012-04-23 NOTE — Addendum Note (Signed)
Addended by: Early Chars on: 04/23/2012 10:26 AM   Modules accepted: Orders

## 2012-04-25 ENCOUNTER — Encounter (HOSPITAL_COMMUNITY)
Admission: RE | Admit: 2012-04-25 | Discharge: 2012-04-25 | Disposition: A | Discharge status: Home / Self Care | Payer: Medicare Other | Source: Ambulatory Visit | Attending: Cardiology | Admitting: Cardiology

## 2012-04-28 ENCOUNTER — Ambulatory Visit (INDEPENDENT_AMBULATORY_CARE_PROVIDER_SITE_OTHER): Payer: Medicare Other | Admitting: *Deleted

## 2012-04-28 ENCOUNTER — Encounter (HOSPITAL_COMMUNITY)
Admission: RE | Admit: 2012-04-28 | Discharge: 2012-04-28 | Disposition: A | Discharge status: Home / Self Care | Payer: Medicare Other | Source: Ambulatory Visit | Attending: Cardiology | Admitting: Cardiology

## 2012-04-28 DIAGNOSIS — Z7901 Long term (current) use of anticoagulants: Secondary | ICD-10-CM

## 2012-04-28 DIAGNOSIS — I4891 Unspecified atrial fibrillation: Secondary | ICD-10-CM

## 2012-04-29 DIAGNOSIS — I10 Essential (primary) hypertension: Secondary | ICD-10-CM | POA: Diagnosis not present

## 2012-04-30 ENCOUNTER — Encounter (HOSPITAL_COMMUNITY)
Admission: RE | Admit: 2012-04-30 | Discharge: 2012-04-30 | Disposition: A | Discharge status: Home / Self Care | Payer: Medicare Other | Source: Ambulatory Visit | Attending: Cardiology | Admitting: Cardiology

## 2012-04-30 LAB — BASIC METABOLIC PANEL
BUN: 21 mg/dL (ref 6–23)
Calcium: 9.2 mg/dL (ref 8.4–10.5)
Glucose, Bld: 118 mg/dL — ABNORMAL HIGH (ref 70–99)
Sodium: 142 mEq/L (ref 135–145)

## 2012-05-02 ENCOUNTER — Other Ambulatory Visit: Payer: Self-pay | Admitting: Cardiology

## 2012-05-02 ENCOUNTER — Encounter (HOSPITAL_COMMUNITY)
Admission: RE | Admit: 2012-05-02 | Discharge: 2012-05-02 | Disposition: A | Discharge status: Home / Self Care | Payer: Medicare Other | Source: Ambulatory Visit | Attending: Cardiology | Admitting: Cardiology

## 2012-05-05 ENCOUNTER — Encounter (HOSPITAL_COMMUNITY)
Admission: RE | Admit: 2012-05-05 | Discharge: 2012-05-05 | Disposition: A | Discharge status: Home / Self Care | Payer: Medicare Other | Source: Ambulatory Visit | Attending: Cardiology | Admitting: Cardiology

## 2012-05-05 ENCOUNTER — Ambulatory Visit (INDEPENDENT_AMBULATORY_CARE_PROVIDER_SITE_OTHER): Payer: Medicare Other | Admitting: *Deleted

## 2012-05-05 DIAGNOSIS — I4891 Unspecified atrial fibrillation: Secondary | ICD-10-CM | POA: Diagnosis not present

## 2012-05-05 DIAGNOSIS — Z7901 Long term (current) use of anticoagulants: Secondary | ICD-10-CM

## 2012-05-05 LAB — POCT INR: INR: 1.7

## 2012-05-07 ENCOUNTER — Encounter (HOSPITAL_COMMUNITY)
Admission: RE | Admit: 2012-05-07 | Discharge: 2012-05-07 | Disposition: A | Discharge status: Home / Self Care | Payer: Medicare Other | Source: Ambulatory Visit | Attending: Cardiology | Admitting: Cardiology

## 2012-05-09 ENCOUNTER — Encounter (HOSPITAL_COMMUNITY)
Admission: RE | Admit: 2012-05-09 | Discharge: 2012-05-09 | Disposition: A | Discharge status: Home / Self Care | Payer: Medicare Other | Source: Ambulatory Visit | Attending: Cardiology | Admitting: Cardiology

## 2012-05-12 ENCOUNTER — Encounter (HOSPITAL_COMMUNITY)
Admission: RE | Admit: 2012-05-12 | Discharge: 2012-05-12 | Disposition: A | Discharge status: Home / Self Care | Payer: Medicare Other | Source: Ambulatory Visit | Attending: Cardiology | Admitting: Cardiology

## 2012-05-14 ENCOUNTER — Encounter (HOSPITAL_COMMUNITY)
Admission: RE | Admit: 2012-05-14 | Discharge: 2012-05-14 | Disposition: A | Discharge status: Home / Self Care | Payer: Medicare Other | Source: Ambulatory Visit | Attending: Cardiology | Admitting: Cardiology

## 2012-05-14 ENCOUNTER — Ambulatory Visit (INDEPENDENT_AMBULATORY_CARE_PROVIDER_SITE_OTHER): Payer: Medicare Other | Admitting: *Deleted

## 2012-05-14 DIAGNOSIS — I4891 Unspecified atrial fibrillation: Secondary | ICD-10-CM | POA: Diagnosis not present

## 2012-05-14 DIAGNOSIS — Z7901 Long term (current) use of anticoagulants: Secondary | ICD-10-CM

## 2012-05-16 ENCOUNTER — Encounter (HOSPITAL_COMMUNITY)
Admission: RE | Admit: 2012-05-16 | Discharge: 2012-05-16 | Disposition: A | Discharge status: Home / Self Care | Payer: Medicare Other | Source: Ambulatory Visit | Attending: Cardiology | Admitting: Cardiology

## 2012-05-19 ENCOUNTER — Encounter (HOSPITAL_COMMUNITY)
Admission: RE | Admit: 2012-05-19 | Discharge: 2012-05-19 | Disposition: A | Discharge status: Home / Self Care | Payer: Medicare Other | Source: Ambulatory Visit | Attending: Cardiology | Admitting: Cardiology

## 2012-05-20 NOTE — Progress Notes (Signed)
ELECTROPHYSIOLOGY OFFICE NOTE  Patient ID: [name]. MRN: [id number], DOB/AGE: [birth year]   Date of Visit: 05/21/2012  Primary Physician: Carylon Perches, MD Primary Cardiologist: Diona Browner, MD Cardiothoracic surgeon: Maren Beach, MD Reason for Visit: Device follow-up  History of Present Illness [name] is an 78 year old gentleman with AS s/p AVR with bioprosthetic valve April 2013 who developed AF and CHB postoperatively and is now s/p PPM implantation. He presents today for routine device follow-up. He is participating in cardiac rehabilitation. He has no complaints and states he feels he is getting stronger. He denies CP, SOB, palpitations, dizziness, near syncope or syncope.   Past Medical History  Diagnosis Date  . Essential hypertension, benign   . DJD (degenerative joint disease)   . PMR (polymyalgia rheumatica)   . Gout   . Arthritis   . Aortic stenosis     Bioprosthetic AVR  . Hx of adenomatous colonic polyps   . Aneurysm of thoracic aorta     Ascending. 5.7 x 5.5cm - Bentall procedure  . Coronary atherosclerosis of native coronary artery     Multivessel - LIMA to LAD, SVG to OM, SVG to PDA  . H/O hiatal hernia   . Esophageal dysmotility   . Fibromyalgia   . Complete heart block     Status post pacemaker placement  . Atrial fibrillation     Documented on pacer interrogation 6/13     Past Surgical History  Procedure Date  . Bilateral knee arthroscopy   . Appendectomy   . Hemorroidectomy   . Left inguinal hernia repair   . Hernia repair   . Back surgery     1965 or 1966 lumbar back surgery  . Bentall procedure 01/24/2012    Procedure: BENTALL PROCEDURE;  Surgeon: Kerin Perna, MD;  Location: [facility] OR;  Service: Open Heart Surgery;  Laterality: N/A;  . Coronary artery bypass graft 01/24/2012    Procedure: CORONARY ARTERY BYPASS GRAFTING (CABG);  Surgeon: Kerin Perna, MD;  Location: [facility] OR;  Service: Open Heart Surgery;  Laterality: N/A;  . Pacemaker placement      Medtronic 4/13 - Dr. Johney Frame  . Aortic valve replacement 01/24/12     Allergies/Intolerances No Known Allergies   Current Home Medications Current Outpatient Prescriptions  Medication Sig Dispense Refill  . aspirin 81 MG tablet Take 81 mg by mouth daily.        . colchicine 0.6 MG tablet Take 0.6 mg by mouth as needed.      . furosemide (LASIX) 40 MG tablet Take 40 mg by mouth as directed. 60 mg total  daily        . senna (SENOKOT) 8.6 MG tablet Take 1 tablet by mouth as needed.      . TOPROL XL 25 MG 24 hr tablet TAKE ONE TABLET BY MOUTH ONCE DAILY.  30 each  3  . warfarin (COUMADIN) 5 MG tablet Take 1 tablet (5 mg total) by mouth daily.  90 tablet  0    Social History Social History  . Marital Status: Married    Spouse Name: N/A    Number of Children: 4  . Years of Education: N/A   Occupational History  . Retired     Engineer, maintenance   Social History Main Topics  . Smoking status: Never Smoker   . Smokeless tobacco: Never Used  . Alcohol Use: No  . Drug Use: No   Review of Systems General: No chills, fever, night  sweats or weight changes Cardiovascular: No chest pain, dyspnea on exertion, edema, orthopnea, palpitations, paroxysmal nocturnal dyspnea Dermatological: No rash, lesions or masses Respiratory: No cough, dyspnea Urologic: No hematuria, dysuria Abdominal: No nausea, vomiting, diarrhea, bright red blood per rectum, melena, or hematemesis Neurologic: No visual changes, weakness, changes in mental status All other systems reviewed and are otherwise negative except as noted above.  Physical Exam Blood pressure 120/67, pulse 76, height 5\' 8"  (1.727 m), weight 218 lb (98.884 kg).  General: Well developed, well appearing 78 year old male in no acute distress. HEENT: Normocephalic, atraumatic. EOMs intact. Sclera nonicteric. Oropharynx clear.  Neck: Supple. No JVD. Lungs: Respirations regular and unlabored. Rales at left base otherwise CTA bilaterally. No wheezes  or rhonchi. Heart: RRR. S1, S2 present. No murmurs, rub, S3 or S4. Abdomen: Soft, non-distended.  Extremities: No clubbing, cyanosis or edema.  Psych: Normal affect. Neuro: Alert and oriented X 3. Moves all extremities spontaneously.   Diagnostics Device interrogation shows normal PPM function with good battery status and stable lead parameters/measurements; programming changes made - reprogrammed to DDDR with mode switch on; see PaceArt report   Assessment and Plan 1. CHB s/p PPM - normal device function; reprogrammed to DDDR with mode switch on; reviewed interrogation with Dr. Johney Frame; return to clinic for device follow-up in 3 months 2. Atrial fibrillation - continue rate control with metoprolol and chronic anticoagulation with warfarin for stroke risk reduction; continue routine follow-up with primary cardiologist, Dr. Diona Browner  [name] expressed verbal understanding and agrees with this plan of care. Signed, Rick Duff, PA-C 05/21/2012, 2:36 PM

## 2012-05-21 ENCOUNTER — Encounter: Payer: Self-pay | Admitting: Internal Medicine

## 2012-05-21 ENCOUNTER — Ambulatory Visit (INDEPENDENT_AMBULATORY_CARE_PROVIDER_SITE_OTHER): Payer: Medicare Other | Admitting: Cardiology

## 2012-05-21 ENCOUNTER — Encounter (HOSPITAL_COMMUNITY)
Admission: RE | Admit: 2012-05-21 | Discharge: 2012-05-21 | Disposition: A | Discharge status: Home / Self Care | Payer: Medicare Other | Source: Ambulatory Visit | Attending: Cardiology | Admitting: Cardiology

## 2012-05-21 ENCOUNTER — Ambulatory Visit (INDEPENDENT_AMBULATORY_CARE_PROVIDER_SITE_OTHER): Payer: Medicare Other | Admitting: *Deleted

## 2012-05-21 ENCOUNTER — Encounter: Payer: Self-pay | Admitting: Cardiology

## 2012-05-21 VITALS — BP 120/67 | HR 76 | Ht 68.0 in | Wt 218.0 lb

## 2012-05-21 DIAGNOSIS — Z7901 Long term (current) use of anticoagulants: Secondary | ICD-10-CM

## 2012-05-21 DIAGNOSIS — I4891 Unspecified atrial fibrillation: Secondary | ICD-10-CM

## 2012-05-21 DIAGNOSIS — I442 Atrioventricular block, complete: Secondary | ICD-10-CM | POA: Diagnosis not present

## 2012-05-21 DIAGNOSIS — Z950 Presence of cardiac pacemaker: Secondary | ICD-10-CM

## 2012-05-21 LAB — PACEMAKER DEVICE OBSERVATION
AL AMPLITUDE: 2.8 mv
ATRIAL PACING PM: 35.8
RV LEAD IMPEDENCE PM: 599 Ohm
RV LEAD THRESHOLD: 1 V

## 2012-05-21 MED ORDER — WARFARIN SODIUM 5 MG PO TABS: 5.0000 mg | ORAL_TABLET | Freq: Every day | ORAL | Status: DC

## 2012-05-21 NOTE — Patient Instructions (Signed)
Your physician recommends that you schedule a follow-up appointment in: 3 months with Rick Duff, PA

## 2012-05-23 ENCOUNTER — Encounter (HOSPITAL_COMMUNITY)
Admission: RE | Admit: 2012-05-23 | Discharge: 2012-05-23 | Disposition: A | Discharge status: Home / Self Care | Payer: Medicare Other | Source: Ambulatory Visit | Attending: Cardiology | Admitting: Cardiology

## 2012-05-23 DIAGNOSIS — I2510 Atherosclerotic heart disease of native coronary artery without angina pectoris: Secondary | ICD-10-CM | POA: Diagnosis not present

## 2012-05-23 DIAGNOSIS — I712 Thoracic aortic aneurysm, without rupture: Secondary | ICD-10-CM | POA: Diagnosis not present

## 2012-05-23 DIAGNOSIS — Z954 Presence of other heart-valve replacement: Secondary | ICD-10-CM | POA: Diagnosis not present

## 2012-05-23 DIAGNOSIS — I10 Essential (primary) hypertension: Secondary | ICD-10-CM | POA: Diagnosis not present

## 2012-05-23 DIAGNOSIS — I442 Atrioventricular block, complete: Secondary | ICD-10-CM | POA: Diagnosis not present

## 2012-05-23 DIAGNOSIS — I4891 Unspecified atrial fibrillation: Secondary | ICD-10-CM | POA: Diagnosis not present

## 2012-05-23 DIAGNOSIS — Z5189 Encounter for other specified aftercare: Secondary | ICD-10-CM | POA: Diagnosis not present

## 2012-05-23 DIAGNOSIS — M109 Gout, unspecified: Secondary | ICD-10-CM | POA: Diagnosis not present

## 2012-05-26 ENCOUNTER — Encounter (HOSPITAL_COMMUNITY)
Admission: RE | Admit: 2012-05-26 | Discharge: 2012-05-26 | Disposition: A | Discharge status: Home / Self Care | Payer: Medicare Other | Source: Ambulatory Visit | Attending: Cardiology | Admitting: Cardiology

## 2012-05-26 DIAGNOSIS — I2510 Atherosclerotic heart disease of native coronary artery without angina pectoris: Secondary | ICD-10-CM | POA: Diagnosis not present

## 2012-05-26 DIAGNOSIS — I10 Essential (primary) hypertension: Secondary | ICD-10-CM | POA: Diagnosis not present

## 2012-05-26 DIAGNOSIS — I712 Thoracic aortic aneurysm, without rupture: Secondary | ICD-10-CM | POA: Diagnosis not present

## 2012-05-26 DIAGNOSIS — Z5189 Encounter for other specified aftercare: Secondary | ICD-10-CM | POA: Diagnosis not present

## 2012-05-26 DIAGNOSIS — I4891 Unspecified atrial fibrillation: Secondary | ICD-10-CM | POA: Diagnosis not present

## 2012-05-26 DIAGNOSIS — I442 Atrioventricular block, complete: Secondary | ICD-10-CM | POA: Diagnosis not present

## 2012-05-28 ENCOUNTER — Encounter (HOSPITAL_COMMUNITY)
Admission: RE | Admit: 2012-05-28 | Discharge: 2012-05-28 | Disposition: A | Discharge status: Home / Self Care | Payer: Medicare Other | Source: Ambulatory Visit | Attending: Cardiology | Admitting: Cardiology

## 2012-05-28 DIAGNOSIS — Z5189 Encounter for other specified aftercare: Secondary | ICD-10-CM | POA: Diagnosis not present

## 2012-05-28 DIAGNOSIS — I4891 Unspecified atrial fibrillation: Secondary | ICD-10-CM | POA: Diagnosis not present

## 2012-05-28 DIAGNOSIS — I712 Thoracic aortic aneurysm, without rupture: Secondary | ICD-10-CM | POA: Diagnosis not present

## 2012-05-28 DIAGNOSIS — I10 Essential (primary) hypertension: Secondary | ICD-10-CM | POA: Diagnosis not present

## 2012-05-28 DIAGNOSIS — I2510 Atherosclerotic heart disease of native coronary artery without angina pectoris: Secondary | ICD-10-CM | POA: Diagnosis not present

## 2012-05-28 DIAGNOSIS — I442 Atrioventricular block, complete: Secondary | ICD-10-CM | POA: Diagnosis not present

## 2012-05-30 ENCOUNTER — Encounter (HOSPITAL_COMMUNITY): Payer: Medicare Other

## 2012-06-02 ENCOUNTER — Encounter (HOSPITAL_COMMUNITY)
Admission: RE | Admit: 2012-06-02 | Discharge: 2012-06-02 | Disposition: A | Discharge status: Home / Self Care | Payer: Medicare Other | Source: Ambulatory Visit | Attending: Cardiology | Admitting: Cardiology

## 2012-06-02 DIAGNOSIS — I4891 Unspecified atrial fibrillation: Secondary | ICD-10-CM | POA: Diagnosis not present

## 2012-06-02 DIAGNOSIS — Z5189 Encounter for other specified aftercare: Secondary | ICD-10-CM | POA: Diagnosis not present

## 2012-06-02 DIAGNOSIS — I442 Atrioventricular block, complete: Secondary | ICD-10-CM | POA: Diagnosis not present

## 2012-06-02 DIAGNOSIS — I712 Thoracic aortic aneurysm, without rupture: Secondary | ICD-10-CM | POA: Diagnosis not present

## 2012-06-02 DIAGNOSIS — I10 Essential (primary) hypertension: Secondary | ICD-10-CM | POA: Diagnosis not present

## 2012-06-02 DIAGNOSIS — I2510 Atherosclerotic heart disease of native coronary artery without angina pectoris: Secondary | ICD-10-CM | POA: Diagnosis not present

## 2012-06-04 ENCOUNTER — Encounter (HOSPITAL_COMMUNITY): Payer: Medicare Other

## 2012-06-04 ENCOUNTER — Ambulatory Visit (INDEPENDENT_AMBULATORY_CARE_PROVIDER_SITE_OTHER): Payer: Medicare Other | Admitting: *Deleted

## 2012-06-04 DIAGNOSIS — I4891 Unspecified atrial fibrillation: Secondary | ICD-10-CM

## 2012-06-04 DIAGNOSIS — Z7901 Long term (current) use of anticoagulants: Secondary | ICD-10-CM | POA: Diagnosis not present

## 2012-06-04 LAB — POCT INR: INR: 3.1

## 2012-06-06 ENCOUNTER — Encounter (HOSPITAL_COMMUNITY)
Admission: RE | Admit: 2012-06-06 | Discharge: 2012-06-06 | Disposition: A | Discharge status: Home / Self Care | Payer: Medicare Other | Source: Ambulatory Visit | Attending: Cardiology | Admitting: Cardiology

## 2012-06-06 DIAGNOSIS — Z5189 Encounter for other specified aftercare: Secondary | ICD-10-CM | POA: Diagnosis not present

## 2012-06-06 DIAGNOSIS — I2510 Atherosclerotic heart disease of native coronary artery without angina pectoris: Secondary | ICD-10-CM | POA: Diagnosis not present

## 2012-06-06 DIAGNOSIS — I442 Atrioventricular block, complete: Secondary | ICD-10-CM | POA: Diagnosis not present

## 2012-06-06 DIAGNOSIS — I712 Thoracic aortic aneurysm, without rupture: Secondary | ICD-10-CM | POA: Diagnosis not present

## 2012-06-06 DIAGNOSIS — I10 Essential (primary) hypertension: Secondary | ICD-10-CM | POA: Diagnosis not present

## 2012-06-06 DIAGNOSIS — I4891 Unspecified atrial fibrillation: Secondary | ICD-10-CM | POA: Diagnosis not present

## 2012-06-09 ENCOUNTER — Encounter (HOSPITAL_COMMUNITY)
Admission: RE | Admit: 2012-06-09 | Discharge: 2012-06-09 | Disposition: A | Discharge status: Home / Self Care | Payer: Medicare Other | Source: Ambulatory Visit | Attending: Cardiology | Admitting: Cardiology

## 2012-06-09 DIAGNOSIS — I442 Atrioventricular block, complete: Secondary | ICD-10-CM | POA: Diagnosis not present

## 2012-06-09 DIAGNOSIS — I4891 Unspecified atrial fibrillation: Secondary | ICD-10-CM | POA: Diagnosis not present

## 2012-06-09 DIAGNOSIS — I10 Essential (primary) hypertension: Secondary | ICD-10-CM | POA: Diagnosis not present

## 2012-06-09 DIAGNOSIS — Z5189 Encounter for other specified aftercare: Secondary | ICD-10-CM | POA: Diagnosis not present

## 2012-06-09 DIAGNOSIS — I712 Thoracic aortic aneurysm, without rupture: Secondary | ICD-10-CM | POA: Diagnosis not present

## 2012-06-09 DIAGNOSIS — I2510 Atherosclerotic heart disease of native coronary artery without angina pectoris: Secondary | ICD-10-CM | POA: Diagnosis not present

## 2012-06-11 ENCOUNTER — Ambulatory Visit (INDEPENDENT_AMBULATORY_CARE_PROVIDER_SITE_OTHER): Payer: Medicare Other | Admitting: Cardiology

## 2012-06-11 ENCOUNTER — Encounter (HOSPITAL_COMMUNITY)
Admission: RE | Admit: 2012-06-11 | Discharge: 2012-06-11 | Disposition: A | Discharge status: Home / Self Care | Payer: Medicare Other | Source: Ambulatory Visit | Attending: Cardiology | Admitting: Cardiology

## 2012-06-11 ENCOUNTER — Encounter: Payer: Self-pay | Admitting: Cardiology

## 2012-06-11 ENCOUNTER — Encounter: Payer: Self-pay | Admitting: *Deleted

## 2012-06-11 VITALS — BP 111/67 | HR 95 | Ht 68.0 in | Wt 217.1 lb

## 2012-06-11 DIAGNOSIS — I4891 Unspecified atrial fibrillation: Secondary | ICD-10-CM | POA: Diagnosis not present

## 2012-06-11 DIAGNOSIS — I2510 Atherosclerotic heart disease of native coronary artery without angina pectoris: Secondary | ICD-10-CM

## 2012-06-11 DIAGNOSIS — I10 Essential (primary) hypertension: Secondary | ICD-10-CM

## 2012-06-11 DIAGNOSIS — I442 Atrioventricular block, complete: Secondary | ICD-10-CM | POA: Diagnosis not present

## 2012-06-11 DIAGNOSIS — Z5189 Encounter for other specified aftercare: Secondary | ICD-10-CM | POA: Diagnosis not present

## 2012-06-11 DIAGNOSIS — I712 Thoracic aortic aneurysm, without rupture: Secondary | ICD-10-CM | POA: Diagnosis not present

## 2012-06-11 NOTE — Patient Instructions (Addendum)
Your physician recommends that you schedule a follow-up appointment in: 3 months  Your physician recommends that you return for lab work in: Due prior to follow up appt.  You will receive a reminder letter.

## 2012-06-11 NOTE — Assessment & Plan Note (Signed)
Symptomatically stable following CABG, to complete cardiac rehabilitation soon. Otherwise continue medical therapy, walking regimen at home.

## 2012-06-11 NOTE — Progress Notes (Signed)
Clinical Summary Douglas [name] is a 78 y.o.male presenting for followup. He was seen in July. He has been continuing in cardiac rehabilitation, to complete the program soon. States that his energy has gradually improved. He reports no major sense of palpitations or chest pain. Reports compliance with his medications. He has not had any bleeding problems on Coumadin.  Followup lab work in July showed potassium 4.5, BUN 21, creatinine 1.1, overall stable results. He continues on Lasix daily.  No Known Allergies  Current Outpatient Prescriptions  Medication Sig Dispense Refill  . aspirin 81 MG tablet Take 81 mg by mouth daily.        . colchicine 0.6 MG tablet Take 0.6 mg by mouth as needed.      . furosemide (LASIX) 40 MG tablet Take 40 mg by mouth as directed. 60 mg total  daily        . TOPROL XL 25 MG 24 hr tablet TAKE ONE TABLET BY MOUTH ONCE DAILY.  30 each  3  . warfarin (COUMADIN) 5 MG tablet Take 1 tablet (5 mg total) by mouth daily.  90 tablet  0  . senna (SENOKOT) 8.6 MG tablet Take 1 tablet by mouth as needed.        Past Medical History  Diagnosis Date  . Essential hypertension, benign   . DJD (degenerative joint disease)   . PMR (polymyalgia rheumatica)   . Gout   . Arthritis   . Aortic stenosis     Bioprosthetic AVR  . Hx of adenomatous colonic polyps   . Aneurysm of thoracic aorta     Ascending. 5.7 x 5.5cm - Bentall procedure  . Coronary atherosclerosis of native coronary artery     Multivessel - LIMA to LAD, SVG to OM, SVG to PDA  . H/O hiatal hernia   . Esophageal dysmotility   . Fibromyalgia   . Complete heart block     Status post pacemaker placement  . Atrial fibrillation     Documented on pacer interrogation 6/13    Past Surgical History  Procedure Date  . Bilateral knee arthroscopy   . Appendectomy   . Hemorroidectomy   . Left inguinal hernia repair   . Hernia repair   . Back surgery     1965 or 1966 lumbar back surgery  . Bentall procedure  01/24/2012    Procedure: BENTALL PROCEDURE;  Surgeon: Kerin Perna, MD;  Location: [facility] OR;  Service: Open Heart Surgery;  Laterality: N/A;  . Coronary artery bypass graft 01/24/2012    Procedure: CORONARY ARTERY BYPASS GRAFTING (CABG);  Surgeon: Kerin Perna, MD;  Location: [facility] OR;  Service: Open Heart Surgery;  Laterality: N/A;  . Pacemaker placement     Medtronic 4/13 - Dr. Johney Frame  . Aortic valve replacement 01/24/12    Social History Douglas [name] reports that he has never smoked. He has never used smokeless tobacco. Douglas [name] reports that he does not drink alcohol.  Review of Systems Chronic knee pain. Negative except as outlined above.  Physical Examination Filed Vitals:   06/11/12 1053  BP: 111/67  Pulse: 95    Overweight elderly male in no acute distress.  HEENT: Conjunctiva and lids normal, oropharynx clear.  Neck: Supple, increased girth, no carotid bruits, no thyromegaly.  Thorax: Stable pacer pocket site. Well healing midline sternal incision.  Lungs: Decreased breath sounds, nonlabored breathing at rest.  Cardiac: Regular rate and rhythm, no S3, soft systolic murmur, no pericardial rub.  Abdomen:  Soft, nontender, bowel sounds present, no guarding or rebound.  Extremities: 1+ edema.     Problem List and Plan   Coronary atherosclerosis of native coronary artery Symptomatically stable following CABG, to complete cardiac rehabilitation soon. Otherwise continue medical therapy, walking regimen at home.  Atrial fibrillation Heart rate seems adequately controlled. Continue Coumadin.    Jonelle Sidle, M.D., F.A.C.C.

## 2012-06-11 NOTE — Assessment & Plan Note (Signed)
Heart rate seems adequately controlled. Continue Coumadin.

## 2012-06-12 ENCOUNTER — Telehealth: Payer: Self-pay | Admitting: Cardiology

## 2012-06-12 MED ORDER — AMOXICILLIN 500 MG PO CAPS: ORAL_CAPSULE | ORAL | Status: DC

## 2012-06-12 NOTE — Telephone Encounter (Signed)
See below. Please advise. Mylo Red RN

## 2012-06-12 NOTE — Telephone Encounter (Signed)
No documented drug allergies. Recommend amoxicillin 2 g p.o., 30-60 minutes before the planned procedure.

## 2012-06-12 NOTE — Telephone Encounter (Signed)
Wife is aware antibiotic called in to [place] . He is having a dental cleaning on Monday Mylo Red RN

## 2012-06-12 NOTE — Telephone Encounter (Signed)
PT STATES THAT HE NEEDS ANTIBIOTIC SENT TO [facility], HE HAS DENTAL PROCEDURE MONDAY

## 2012-06-13 ENCOUNTER — Encounter: Payer: Self-pay | Admitting: *Deleted

## 2012-06-13 ENCOUNTER — Encounter (HOSPITAL_COMMUNITY)
Admission: RE | Admit: 2012-06-13 | Discharge: 2012-06-13 | Disposition: A | Discharge status: Home / Self Care | Payer: Medicare Other | Source: Ambulatory Visit | Attending: Cardiology | Admitting: Cardiology

## 2012-06-16 ENCOUNTER — Other Ambulatory Visit: Payer: Self-pay | Admitting: Internal Medicine

## 2012-06-16 ENCOUNTER — Encounter (HOSPITAL_COMMUNITY): Payer: Self-pay

## 2012-06-16 ENCOUNTER — Encounter (HOSPITAL_COMMUNITY): Payer: Medicare Other

## 2012-06-16 ENCOUNTER — Inpatient Hospital Stay (HOSPITAL_COMMUNITY)
Admission: AD | Admit: 2012-06-16 | Discharge: 2012-06-18 | DRG: 379 | Disposition: A | Discharge status: Home / Self Care | Payer: Medicare Other | Source: Ambulatory Visit | Attending: Internal Medicine | Admitting: Internal Medicine

## 2012-06-16 DIAGNOSIS — Z7982 Long term (current) use of aspirin: Secondary | ICD-10-CM

## 2012-06-16 DIAGNOSIS — K259 Gastric ulcer, unspecified as acute or chronic, without hemorrhage or perforation: Secondary | ICD-10-CM | POA: Diagnosis not present

## 2012-06-16 DIAGNOSIS — R7989 Other specified abnormal findings of blood chemistry: Secondary | ICD-10-CM | POA: Diagnosis present

## 2012-06-16 DIAGNOSIS — Z950 Presence of cardiac pacemaker: Secondary | ICD-10-CM

## 2012-06-16 DIAGNOSIS — K449 Diaphragmatic hernia without obstruction or gangrene: Secondary | ICD-10-CM | POA: Diagnosis present

## 2012-06-16 DIAGNOSIS — M1990 Unspecified osteoarthritis, unspecified site: Secondary | ICD-10-CM | POA: Diagnosis present

## 2012-06-16 DIAGNOSIS — K31811 Angiodysplasia of stomach and duodenum with bleeding: Principal | ICD-10-CM | POA: Diagnosis present

## 2012-06-16 DIAGNOSIS — Z7901 Long term (current) use of anticoagulants: Secondary | ICD-10-CM

## 2012-06-16 DIAGNOSIS — M353 Polymyalgia rheumatica: Secondary | ICD-10-CM | POA: Diagnosis present

## 2012-06-16 DIAGNOSIS — I4891 Unspecified atrial fibrillation: Secondary | ICD-10-CM | POA: Diagnosis present

## 2012-06-16 DIAGNOSIS — D649 Anemia, unspecified: Secondary | ICD-10-CM | POA: Diagnosis present

## 2012-06-16 DIAGNOSIS — Z952 Presence of prosthetic heart valve: Secondary | ICD-10-CM

## 2012-06-16 DIAGNOSIS — E785 Hyperlipidemia, unspecified: Secondary | ICD-10-CM | POA: Diagnosis present

## 2012-06-16 DIAGNOSIS — I2510 Atherosclerotic heart disease of native coronary artery without angina pectoris: Secondary | ICD-10-CM | POA: Diagnosis present

## 2012-06-16 DIAGNOSIS — I10 Essential (primary) hypertension: Secondary | ICD-10-CM | POA: Diagnosis present

## 2012-06-16 DIAGNOSIS — M51379 Other intervertebral disc degeneration, lumbosacral region without mention of lumbar back pain or lower extremity pain: Secondary | ICD-10-CM | POA: Diagnosis present

## 2012-06-16 DIAGNOSIS — K254 Chronic or unspecified gastric ulcer with hemorrhage: Secondary | ICD-10-CM | POA: Diagnosis present

## 2012-06-16 DIAGNOSIS — I359 Nonrheumatic aortic valve disorder, unspecified: Secondary | ICD-10-CM | POA: Diagnosis present

## 2012-06-16 DIAGNOSIS — M5137 Other intervertebral disc degeneration, lumbosacral region: Secondary | ICD-10-CM | POA: Diagnosis present

## 2012-06-16 DIAGNOSIS — K922 Gastrointestinal hemorrhage, unspecified: Secondary | ICD-10-CM | POA: Diagnosis not present

## 2012-06-16 DIAGNOSIS — K228 Other specified diseases of esophagus: Secondary | ICD-10-CM | POA: Diagnosis not present

## 2012-06-16 DIAGNOSIS — K921 Melena: Secondary | ICD-10-CM

## 2012-06-16 DIAGNOSIS — Q2733 Arteriovenous malformation of digestive system vessel: Secondary | ICD-10-CM | POA: Diagnosis not present

## 2012-06-16 DIAGNOSIS — Z951 Presence of aortocoronary bypass graft: Secondary | ICD-10-CM | POA: Diagnosis not present

## 2012-06-16 LAB — COMPREHENSIVE METABOLIC PANEL
ALT: 10 U/L (ref 0–53)
AST: 17 U/L (ref 0–37)
CO2: 22 mEq/L (ref 19–32)
Chloride: 106 mEq/L (ref 96–112)
Creatinine, Ser: 1.36 mg/dL — ABNORMAL HIGH (ref 0.50–1.35)
GFR calc Af Amer: 54 mL/min — ABNORMAL LOW (ref >90)
GFR calc non Af Amer: 47 mL/min — ABNORMAL LOW (ref >90)
Glucose, Bld: 133 mg/dL — ABNORMAL HIGH (ref 70–99)
Total Bilirubin: 0.3 mg/dL (ref 0.3–1.2)

## 2012-06-16 LAB — CBC
HCT: 28.8 % — ABNORMAL LOW (ref 39.0–52.0)
Hemoglobin: 9.5 g/dL — ABNORMAL LOW (ref 13.0–17.0)
MCH: 29.1 pg (ref 26.0–34.0)
MCV: 88.3 fL (ref 78.0–100.0)
Platelets: 275 10*3/uL (ref 150–400)
RBC: 3.26 MIL/uL — ABNORMAL LOW (ref 4.22–5.81)
WBC: 9.5 10*3/uL (ref 4.0–10.5)

## 2012-06-16 LAB — PROTIME-INR: Prothrombin Time: 26.2 seconds — ABNORMAL HIGH (ref 11.6–15.2)

## 2012-06-16 LAB — DIFFERENTIAL
Basophils Absolute: 0.1 10*3/uL (ref 0.0–0.1)
Basophils Relative: 1 % (ref 0–1)
Eosinophils Relative: 2 % (ref 0–5)
Monocytes Absolute: 0.5 10*3/uL (ref 0.1–1.0)
Neutro Abs: 6.1 10*3/uL (ref 1.7–7.7)

## 2012-06-16 MED ORDER — ACETAMINOPHEN 325 MG PO TABS: 650.0000 mg | ORAL_TABLET | Freq: Four times a day (QID) | ORAL | Status: DC | PRN

## 2012-06-16 MED ORDER — ONDANSETRON HCL 4 MG/2ML IJ SOLN: 4.0000 mg | Freq: Four times a day (QID) | INTRAMUSCULAR | Status: DC | PRN

## 2012-06-16 MED ORDER — SODIUM CHLORIDE 0.9 % IJ SOLN
INTRAMUSCULAR | Status: AC
  Administered 2012-06-16: 10 mL
  Filled 2012-06-16: qty 3

## 2012-06-16 MED ORDER — ALUM & MAG HYDROXIDE-SIMETH 200-200-20 MG/5ML PO SUSP: 30.0000 mL | Freq: Four times a day (QID) | ORAL | Status: DC | PRN

## 2012-06-16 MED ORDER — PANTOPRAZOLE SODIUM 40 MG IV SOLR
40.0000 mg | Freq: Two times a day (BID) | INTRAVENOUS | Status: DC
  Administered 2012-06-16 – 2012-06-17 (×3): 40 mg via INTRAVENOUS
  Filled 2012-06-16 (×3): qty 40

## 2012-06-16 MED ORDER — VITAMIN K1 10 MG/ML IJ SOLN
3.0000 mg | Freq: Once | INTRAVENOUS | Status: AC
  Administered 2012-06-16: 3 mg via INTRAVENOUS
  Filled 2012-06-16: qty 0.3

## 2012-06-16 MED ORDER — SODIUM CHLORIDE 0.9 % IV SOLN
INTRAVENOUS | Status: DC
  Administered 2012-06-16: 50 mL via INTRAVENOUS
  Administered 2012-06-16: 75 mL via INTRAVENOUS

## 2012-06-16 MED ORDER — ACETAMINOPHEN 650 MG RE SUPP: 650.0000 mg | Freq: Four times a day (QID) | RECTAL | Status: DC | PRN

## 2012-06-16 MED ORDER — SODIUM CHLORIDE 0.9 % IV SOLN
2.0000 g | Freq: Once | INTRAVENOUS | Status: AC
  Administered 2012-06-17: 2 g via INTRAVENOUS
  Filled 2012-06-16: qty 2000

## 2012-06-16 MED ORDER — SODIUM CHLORIDE 0.9 % IV SOLN
INTRAVENOUS | Status: AC
  Filled 2012-06-16: qty 2000

## 2012-06-16 MED ORDER — ONDANSETRON HCL 4 MG PO TABS: 4.0000 mg | ORAL_TABLET | Freq: Four times a day (QID) | ORAL | Status: DC | PRN

## 2012-06-16 MED ORDER — METOPROLOL SUCCINATE ER 25 MG PO TB24
25.0000 mg | ORAL_TABLET | Freq: Every day | ORAL | Status: DC
  Administered 2012-06-16 – 2012-06-17 (×2): 25 mg via ORAL
  Filled 2012-06-16 (×2): qty 1

## 2012-06-16 NOTE — Consult Note (Signed)
Reason for Consult:  GI bleed Referring Physician: Rafeal Skibicki is an 78 y.o. male.  HPI: Dollie is 78 yr old male admitted directly from Dr. Alonza Smoker office for rectal bleeding.  He tells me he had Valve replacement, 3 bypasses, and a pacemaker in April of this year..   Yesterday morning he states he was dizzy. For about a week his stools have been dark.  For the last 2 days he says his stools have been black.  He is taking Coumadin and a baby ASA daily.  He tells me his last colonoscopy was in 2008 and was normal.  Appetite is good.  Weight loss after surgery.  No abdominal pain.  Melena but no bright red blood. He c/o dysphagia to peaches and pills for years.  He says he puts his pills in his yogurt. He does c/o dizziness when standing.  He was guaiac positive in Dr. Alonza Smoker office today.  Past Medical History  Diagnosis Date  . Essential hypertension, benign   . DJD (degenerative joint disease)   . PMR (polymyalgia rheumatica)   . Gout   . Arthritis   . Aortic stenosis     Bioprosthetic AVR  . Hx of adenomatous colonic polyps   . Aneurysm of thoracic aorta     Ascending. 5.7 x 5.5cm - Bentall procedure  . Coronary atherosclerosis of native coronary artery     Multivessel - LIMA to LAD, SVG to OM, SVG to PDA  . H/O hiatal hernia   . Esophageal dysmotility   . Fibromyalgia   . Complete heart block     Status post pacemaker placement  . Atrial fibrillation     Documented on pacer interrogation 6/13    Past Surgical History  Procedure Date  . Bilateral knee arthroscopy   . Appendectomy   . Hemorroidectomy   . Left inguinal hernia repair   . Hernia repair   . Back surgery     1965 or 1966 lumbar back surgery  . Bentall procedure 01/24/2012    Procedure: BENTALL PROCEDURE;  Surgeon: Kerin Perna, MD;  Location: [facility] OR;  Service: Open Heart Surgery;  Laterality: N/A;  . Coronary artery bypass graft 01/24/2012    Procedure: CORONARY ARTERY BYPASS GRAFTING (CABG);  Surgeon:  Kerin Perna, MD;  Location: [facility] OR;  Service: Open Heart Surgery;  Laterality: N/A;  . Pacemaker placement     Medtronic 4/13 - Dr. Johney Frame  . Aortic valve replacement 01/24/12    Family History  Problem Relation Age of Onset  . Kidney disease Mother   . Heart disease Father   . Anesthesia problems Neg Hx   . Hypotension Neg Hx   . Malignant hyperthermia Neg Hx   . Pseudochol deficiency Neg Hx    No current facility-administered medications on file prior to encounter.   Current Outpatient Prescriptions on File Prior to Encounter  Medication Sig Dispense Refill  . aspirin 81 MG tablet Take 81 mg by mouth daily.        . furosemide (LASIX) 40 MG tablet Take 60 mg by mouth daily. 60 mg total  daily        . DISCONTD: warfarin (COUMADIN) 5 MG tablet Take 1 tablet (5 mg total) by mouth daily.  90 tablet  0    Social History:  reports that he has never smoked. He has never used smokeless tobacco. He reports that he does not drink alcohol or use illicit drugs.  Allergies: No  Known Allergies  Medications: I have reviewed the patient's current medications.  Results for orders placed during the hospital encounter of 06/16/12 (from the past 48 hour(s))  CBC     Status: Abnormal   Collection Time   06/16/12 12:53 PM      Component Value Range Comment   WBC 9.5  4.0 - 10.5 K/uL    RBC 3.26 (*) 4.22 - 5.81 MIL/uL    Hemoglobin 9.5 (*) 13.0 - 17.0 g/dL    HCT 16.1 (*) 09.6 - 52.0 %    MCV 88.3  78.0 - 100.0 fL    MCH 29.1  26.0 - 34.0 pg    MCHC 33.0  30.0 - 36.0 g/dL    RDW 04.5 (*) 40.9 - 15.5 %    Platelets 275  150 - 400 K/uL   DIFFERENTIAL     Status: Normal   Collection Time   06/16/12 12:53 PM      Component Value Range Comment   Neutrophils Relative 65  43 - 77 %    Neutro Abs 6.1  1.7 - 7.7 K/uL    Lymphocytes Relative 28  12 - 46 %    Lymphs Abs 2.6  0.7 - 4.0 K/uL    Monocytes Relative 6  3 - 12 %    Monocytes Absolute 0.5  0.1 - 1.0 K/uL    Eosinophils Relative 2  0  - 5 %    Eosinophils Absolute 0.2  0.0 - 0.7 K/uL    Basophils Relative 1  0 - 1 %    Basophils Absolute 0.1  0.0 - 0.1 K/uL   PROTIME-INR     Status: Abnormal   Collection Time   06/16/12 12:53 PM      Component Value Range Comment   Prothrombin Time 26.2 (*) 11.6 - 15.2 seconds    INR 2.36 (*) 0.00 - 1.49     No results found.  ROS Blood pressure 110/69, pulse 114, temperature 97.7 F (36.5 C), temperature source Axillary, resp. rate 24, height 5\' 8"  (1.727 m), weight 214 lb 1.1 oz (97.1 kg), SpO2 82.00%. Physical ExamAlert and oriented. Skin warm and dry. Oral mucosa is moist.   . Sclera anicteric, conjunctivae is pink. Thyroid not enlarged. No cervical lymphadenopathy. Lungs clear. Heart regular rate and rhythm.  Abdomen is soft. Bowel sounds are positive. No hepatomegaly. No abdominal masses felt. No tenderness.  No edema to lower extremities.    Assessment/Plan: Melena. PUD needs to be ruled out. Dysphagia to pills/solids. Will discuss with Dr. Karilyn Cota: EGD/ED [facility]iment 06/16/2012, 1:48 PM     GI attending note; Patient was seen last evening and records reviewed. He presents with melena in the setting of anticoagulation with INR in the low and therapeutic range. He is also on low-dose aspirin. There is no history of peptic disease. He does complain of dysphagia primarily to pills at times to solids as noted Ms. Setzer note. Will consider esophageal dilation if INR closer to 1.5 or 1.6. Patient will also receive SBE prophylaxis with 2 g of ampicillin IV. Procedure reviewed with the patient and is agreeable.

## 2012-06-17 ENCOUNTER — Encounter (HOSPITAL_COMMUNITY)
Admission: AD | Disposition: A | Discharge status: Home / Self Care | Payer: Self-pay | Source: Ambulatory Visit | Attending: Internal Medicine

## 2012-06-17 DIAGNOSIS — Q2733 Arteriovenous malformation of digestive system vessel: Secondary | ICD-10-CM

## 2012-06-17 HISTORY — PX: ESOPHAGOGASTRODUODENOSCOPY: SHX5428

## 2012-06-17 LAB — CBC
MCH: 29.6 pg (ref 26.0–34.0)
MCH: 29.9 pg (ref 26.0–34.0)
MCHC: 32.7 g/dL (ref 30.0–36.0)
MCV: 89.9 fL (ref 78.0–100.0)
Platelets: 208 10*3/uL (ref 150–400)
Platelets: 251 10*3/uL (ref 150–400)
RDW: 17.4 % — ABNORMAL HIGH (ref 11.5–15.5)

## 2012-06-17 LAB — BASIC METABOLIC PANEL
BUN: 39 mg/dL — ABNORMAL HIGH (ref 6–23)
Calcium: 9.3 mg/dL (ref 8.4–10.5)
Creatinine, Ser: 1.25 mg/dL (ref 0.50–1.35)
GFR calc non Af Amer: 52 mL/min — ABNORMAL LOW (ref >90)
Glucose, Bld: 123 mg/dL — ABNORMAL HIGH (ref 70–99)

## 2012-06-17 LAB — HEMOGLOBIN AND HEMATOCRIT, BLOOD: Hemoglobin: 8.5 g/dL — ABNORMAL LOW (ref 13.0–17.0)

## 2012-06-17 SURGERY — EGD (ESOPHAGOGASTRODUODENOSCOPY)
Anesthesia: Moderate Sedation

## 2012-06-17 MED ORDER — MIDAZOLAM HCL 5 MG/5ML IJ SOLN
INTRAMUSCULAR | Status: DC | PRN
  Administered 2012-06-17: 2 mg via INTRAVENOUS
  Administered 2012-06-17 (×3): 1 mg via INTRAVENOUS

## 2012-06-17 MED ORDER — MEPERIDINE HCL 50 MG/ML IJ SOLN
INTRAMUSCULAR | Status: AC
  Filled 2012-06-17: qty 1

## 2012-06-17 MED ORDER — SODIUM CHLORIDE 0.9 % IV SOLN: INTRAVENOUS | Status: DC

## 2012-06-17 MED ORDER — STERILE WATER FOR IRRIGATION IR SOLN
Status: DC | PRN
  Administered 2012-06-17: 08:00:00

## 2012-06-17 MED ORDER — PANTOPRAZOLE SODIUM 40 MG PO TBEC
40.0000 mg | DELAYED_RELEASE_TABLET | Freq: Two times a day (BID) | ORAL | Status: DC
  Administered 2012-06-18: 40 mg via ORAL
  Filled 2012-06-17: qty 1

## 2012-06-17 MED ORDER — BUTAMBEN-TETRACAINE-BENZOCAINE 2-2-14 % EX AERO
INHALATION_SPRAY | CUTANEOUS | Status: DC | PRN
  Administered 2012-06-17: 2 via TOPICAL

## 2012-06-17 MED ORDER — MIDAZOLAM HCL 5 MG/5ML IJ SOLN
INTRAMUSCULAR | Status: AC
  Filled 2012-06-17: qty 10

## 2012-06-17 MED ORDER — SODIUM CHLORIDE 0.45 % IV SOLN
Freq: Once | INTRAVENOUS | Status: AC
  Administered 2012-06-17: 07:00:00 via INTRAVENOUS

## 2012-06-17 MED ORDER — SODIUM CHLORIDE 0.9 % IJ SOLN
INTRAMUSCULAR | Status: AC
  Administered 2012-06-17: 10 mL
  Filled 2012-06-17: qty 3

## 2012-06-17 MED ORDER — MEPERIDINE HCL 25 MG/ML IJ SOLN
INTRAMUSCULAR | Status: DC | PRN
  Administered 2012-06-17 (×2): 25 mg via INTRAVENOUS

## 2012-06-17 NOTE — Op Note (Signed)
EGD PROCEDURE REPORT  PATIENT:  Douglas [name]  MR#:  [id number] Birthdate:  06/20/1928, 78 y.o., male Endoscopist:  Dr. Malissa Hippo, MD Referred By:  Dr. Carylon Perches, MD Procedure Date: 06/17/2012  Procedure:   EGD with attempted ED.  Indications:  Patient is a [age]-year-old Caucasian male who presents with melena and anemia. Patient is anticoagulated his INR was 2.36 low-dose vitamin K corrected down to 1.71 this morning. His hemoglobin has dropped from 9.5 on admission to 8.5 this morning            Informed Consent:  The risks, benefits, alternatives & imponderables which include, but are not limited to, bleeding, infection, perforation, drug reaction and potential missed lesion have been reviewed.  The potential for biopsy, lesion removal, esophageal dilation, etc. have also been discussed.  Questions have been answered.  All parties agreeable.  Please see history & physical in medical record for more information.  Medications:  Demerol 50 mg IV Versed 5 mg IV Cetacaine spray topically for oropharyngeal anesthesia  Description of procedure:  The endoscope was introduced through the mouth and advanced to the second portion of the duodenum without difficulty or limitations. The mucosal surfaces were surveyed very carefully during advancement of the scope and upon withdrawal.  Findings:  Esophagus:  Somewhat tortuous esophagus proximally below UES.  Mucosa of the esophagus was normal. The 5 mm ulcer noted at GE junction with clean base. GEJ:  42 cm Hiatus:  44 cm Stomach:  Stomach was empty and distended very well with insufflation. Folds in the proximal stomach are normal. Examination of mucosa revealed single nonbleeding AV malformation at gastric body along the lesser curvature. Antral mucosa was normal. Pyloric channel was patent. Angularis fundus and cardia were examined by retroflexing the scope and were unremarkable. Duodenum:  Nodularity to mucosa of proximal bulb secondary to  Brunner's gland hypertrophy. No erosions or ulcers noted. Mucosa and folds in the second part of the duodenum were normal.  Therapeutic/Diagnostic Maneuvers Performed:   Esophageal dilation was attempted with 54 French Maloney dilator but could not pass it on hypopharynx. Therefore her esophagus could not be dilated. Gastric AV malformation was ablated with gold probe. No bleeding noted during this process.  Complications:  None  Impression: Tortuous proximal esophageal segment below UES. Unable to pass [facility] dilator. Single small ulcer at GE junction without stigmata of bleed. Small sliding hiatal hernia. Single nonbleeding AV malformation at gastric body. It was ablated with cold probe. While stigmata of active bleeding not present, he could have bled from ulcer at GE junction or gastric AV malformation. No further workup unless bleeding recurs.  Recommendations:  H&H at 2 PM and in a.m.. Heart healthy diet. Continue PPI to twice a day schedule. Change to oral route. Barium pill esophagogram on an outpatient basis to further evaluate his dysphagia prior to considering esophageal dilation under fluoroscopy at a later date.   REHMAN,NAJEEB U  06/17/2012  8:10 AM  CC: Dr. Carylon Perches, MD & Dr. Bonnetta Barry ref. provider found

## 2012-06-17 NOTE — Care Management Note (Signed)
    Page 1 of 1   06/18/2012     9:20:00 AM   CARE MANAGEMENT NOTE 06/18/2012  Patient:  Douglas [name], Douglas [name]   Account Number:  [id number]  Date Initiated:  06/17/2012  Documentation initiated by:  Sharrie Rothman  Subjective/Objective Assessment:   Pt admitted from home with GI bleed. Pt lives with his wife and will return home at discharge. Pt is independent with ADL's.     Action/Plan:   No Cm/HH needs at this time. Will continue to follow.   Anticipated DC Date:  06/18/2012   Anticipated DC Plan:  HOME/SELF CARE      DC Planning Services  CM consult      Choice offered to / List presented to:             Status of service:  Completed, signed off Medicare Important Message given?   (If response is "NO", the following Medicare IM given date fields will be blank) Date Medicare IM given:   Date Additional Medicare IM given:    Discharge Disposition:  HOME/SELF CARE  Per UR Regulation:    If discussed at Long Length of Stay Meetings, dates discussed:    Comments:  06/18/12 0918 Arlyss Queen, RN BSN CM Pt discharged home today. No CM/HH needs noted.  06/17/12 1155 Arlyss Queen, RN BSN CM

## 2012-06-17 NOTE — H&P (Signed)
NAME:  Douglas [name], Douglas [name] NO.:  [id number]  MEDICAL RECORD NO.:  [id number]  LOCATION:                                 FACILITY:  PHYSICIAN:  Kingsley Callander. Ouida Sills, MD       DATE OF BIRTH:  12/19/1927  DATE OF ADMISSION:  06/16/2012 DATE OF DISCHARGE:  LH                             HISTORY & PHYSICAL   CHIEF COMPLAINT:  Melena.  HISTORY OF PRESENT ILLNESS:  This patient is an 78 year old white male, who presented to the office complaining of a dark tarry stool, shortness of breath, and weakness.  The patient is anticoagulated with Coumadin because of atrial fibrillation.  He has undergone a thoracic aortic aneurysm repair, aortic valve replacement, and bypass surgery in April of this year.  He required pacemaker placement for complete heart block postoperatively.  He has had atrial fibrillation requiring anticoagulation.  Last INR was 3.1, a week prior to admission.  He denies any vomiting or abdominal pain.  He has noticed some dark stools for approximately a week and then over the past 2 days, has had black stools.  On evaluation in the office, he had a black tarry stool on the exam glove and this was Hemoccult positive.  He was therefore hospitalized for additional GI evaluation.  PAST MEDICAL HISTORY: 1. Thoracic aortic aneurysm repair. 2. Aortic valve replacement with a tissue valve. 3. Coronary artery bypass surgery. 4. Pacemaker placement. 5. Hypertension. 6. Osteoarthritis. 7. Lumbar disk disease. 8. Hyperlipidemia. 9. Gout. 10.Colon adenomas, had a colonoscopy with no polyps being found in     2008. 11.History of PMR. 12.Hemorrhoidectomy. 13.Appendectomy. 14.Left inguinal hernia repair.  MEDICATIONS: 1. Aspirin 81 mg daily. 2. Coumadin 5 mg daily except 2.5 mg on Wednesday. 3. Lasix 60 mg daily. 4. Metoprolol XL 25 mg daily.  ALLERGIES:  None.  SOCIAL HISTORY:  He does not smoke, drink, or use drugs.  FAMILY HISTORY:  His mother had kidney  failure.  His father had hypertension.  His sister has hypertension.  REVIEW OF SYSTEMS:  He has had dyspnea on exertion and fatigue.  He has not experienced syncope or chest pain.  His appetite has been good.  PHYSICAL EXAMINATION:  VITAL SIGNS:  Temperature 98.2, pulse 80, respirations 23, blood pressure 115/68. GENERAL:  Pale alert in no distress. HEENT:  No scleral icterus.  Nose and oropharynx are unremarkable. LUNGS:  Clear. HEART:  Regular with no murmurs. ABDOMEN:  Soft and nontender with no hepatosplenomegaly. RECTAL:  Reveals a nonnodular prostate and black story stool which is Hemoccult positive. EXTREMITIES:  No cyanosis, clubbing, or edema. NEUROLOGIC:  No focal weakness. LYMPH NODES:  No cervical or supraclavicular enlargement. SKIN:  Unremarkable.  He has a well-healed sternotomy wound.  LABORATORY DATA:  Sodium 140, potassium 3.8, bicarb 22, BUN 52, creatinine 1.36, calcium 9.8, glucose 133.  White count 9.5, hemoglobin 9.5, platelets 275,000.  INR 2.36.  IMPRESSION AND PLAN: 1. Gastrointestinal bleed.  Coumadin and aspirin are being stopped.     He will be treated empirically with IV Protonix 40 mg q.12 h.  A     Gastroenterology consultation will be obtained with Dr.  Rehman.     This case has been discussed with Dr. Karilyn Cota.  He has no history of     ulcer disease.  He does not use any other NSAIDs. 2. History of thoracic aortic aneurysm repair, bypass surgery, aortic     valve replacement and pacemaker placement, stable. 3. Hypertension. 4. Atrial fibrillation.  Continue metoprolol, but hold Coumadin. 5. Azotemia.  BUN and creatinine appear elevated secondary to blood in     his gastrointestinal tract. 6. History of colon adenoma.     Kingsley Callander. Ouida Sills, MD     ROF/MEDQ  D:  06/17/2012  T:  06/17/2012  Job:  [id number]

## 2012-06-17 NOTE — Progress Notes (Signed)
UR Chart Review Completed  

## 2012-06-18 ENCOUNTER — Encounter (HOSPITAL_COMMUNITY): Payer: Medicare Other

## 2012-06-18 DIAGNOSIS — K259 Gastric ulcer, unspecified as acute or chronic, without hemorrhage or perforation: Secondary | ICD-10-CM

## 2012-06-18 DIAGNOSIS — K228 Other specified diseases of esophagus: Secondary | ICD-10-CM

## 2012-06-18 DIAGNOSIS — K2289 Other specified disease of esophagus: Secondary | ICD-10-CM

## 2012-06-18 LAB — CBC WITH DIFFERENTIAL/PLATELET
Basophils Absolute: 0 10*3/uL (ref 0.0–0.1)
Eosinophils Absolute: 0.6 10*3/uL (ref 0.0–0.7)
Lymphocytes Relative: 19 % (ref 12–46)
Lymphs Abs: 1.8 10*3/uL (ref 0.7–4.0)
MCH: 29.7 pg (ref 26.0–34.0)
Neutrophils Relative %: 69 % (ref 43–77)
Platelets: 217 10*3/uL (ref 150–400)
RBC: 2.79 MIL/uL — ABNORMAL LOW (ref 4.22–5.81)
WBC: 9.4 10*3/uL (ref 4.0–10.5)

## 2012-06-18 LAB — BASIC METABOLIC PANEL
Calcium: 9.3 mg/dL (ref 8.4–10.5)
GFR calc non Af Amer: 59 mL/min — ABNORMAL LOW (ref >90)
Sodium: 140 mEq/L (ref 135–145)

## 2012-06-18 MED ORDER — PANTOPRAZOLE SODIUM 40 MG PO TBEC: 40.0000 mg | DELAYED_RELEASE_TABLET | Freq: Every day | ORAL | Status: DC

## 2012-06-18 NOTE — Progress Notes (Signed)
NAME:  Douglas, [name] NO.:  [id number]  MEDICAL RECORD NO.:  [id number]  LOCATION:                                 FACILITY:  PHYSICIAN:  Kingsley Callander. Ouida Sills, MD       DATE OF BIRTH:  1928/03/11  DATE OF PROCEDURE:  06/17/2012 DATE OF DISCHARGE:                                PROGRESS NOTE   Douglas [name] has had no sign of increased bleeding.  He has remained hemodynamically stable.  His hemoglobin is 8.5, both Tuesday morning and Tuesday afternoon.  He has undergone an upper endoscopy by Dr. Karilyn Cota. He was found to have an ulcer at the GE junction and an AVM which was treated.  PHYSICAL EXAMINATION:  LUNGS:  Clear. HEART:  Irregularly irregular. ABDOMEN:  Soft and nontender.  IMPRESSION/PLAN: 1. Upper gastrointestinal bleed.  Protonix has been switched to the OR     route.  His diet has been advanced.  Aspirin will be discontinued     from now.  Coumadin will be restarted later this week. 2. Azotemia.  BUN and creatinine are improving. 3. Status post aortic valve replacement stable.     Kingsley Callander. Ouida Sills, MD     ROF/MEDQ  D:  06/17/2012  T:  06/18/2012  Job:  [id number]

## 2012-06-18 NOTE — Progress Notes (Signed)
Slept well all shift. No complaints or requests.

## 2012-06-18 NOTE — Progress Notes (Signed)
CBC obtained per Aurther Loft. HGB 8.3 this AM. Dr. Karilyn Cota notified. Was told to call Dr. Ouida Sills and notify him. Dr. Ouida Sills ordered to send patient home and to send patient to the lab the day before his appointment.

## 2012-06-18 NOTE — Progress Notes (Signed)
Patient left facility with no questions. Prescriptions given. Instructed patient to go to the lab for blood draw the day before his appointment. Patient left with family. Taken out of facility by staff via wheelchair.

## 2012-06-18 NOTE — Progress Notes (Signed)
Patient ID: Douglas [name], male   DOB: 12/18/1927, 78 y.o.   MRN: [id number] S. Says he feels better. Stool was black last night but brown this am. He had a small BM this morning. To be discharged this morning. Hemoglobin 8.7 this am.  Patient may resume Coumadin per Dr. Karilyn Cota. Thank you for allowing [place] to participate in his care.  Filed Vitals:   06/18/12 0521  BP: 101/63  Pulse: 80  Temp: 98.8 F (37.1 C)  Resp: 20   Continue the Protonix.

## 2012-06-18 NOTE — Discharge Summary (Signed)
NAME:  CAYDIN, YEATTS NO.:  [id number]  MEDICAL RECORD NO.:  [id number]  LOCATION:                                 FACILITY:  PHYSICIAN:  Kingsley Callander. Ouida Sills, MD       DATE OF BIRTH:  Jul 24, 1928  DATE OF ADMISSION:  06/16/2012 DATE OF DISCHARGE:  08/28/2013LH                              DISCHARGE SUMMARY   DISCHARGE DIAGNOSES: 1. Upper gastrointestinal bleed. 2. Gastric ulcer. 3. Anemia. 4. Chronic atrial fibrillation. 5. Hypertension. 6. History of thoracic aortic aneurysm repair, aortic valve     replacement, bypass surgery, and pacemaker placement earlier this     year.  DISCHARGE MEDICATIONS: 1. Lasix 60 mg daily. 2. Coumadin 5 mg daily except 2.5 mg on Wednesday. 3. Toprol-XL 25 mg daily. 4. Protonix 40 mg daily.  HOSPITAL COURSE:  This patient is an 78 year old male who presented with black tarry stools.  His hemoglobin was 9.5 with an MCV of 88.  He had been on aspirin and Coumadin.  He remained hemodynamically stable.  He was seen in Gastroenterology consultation by Dr. Karilyn Cota and underwent an upper endoscopy which revealed an ulcer at the gastroesophageal junction and an AVM in the stomach which was treated.  Coumadin and aspirin were stopped.  He was treated with 3 mg of vitamin K.  His INR dropped to 1.7.  His INR was 2.3 on admission.  His hemoglobin trended down to 8.5. His discharge hemoglobin is 8.7.  His bleeding appears to have stopped. He has remained hemodynamically stable.  He has not required a transfusion.  He will continue Protonix as an outpatient and will follow up in the office in 2 weeks.  His condition at discharge is much improved.  Aspirin is being stopped for now.  Coumadin is being restarted and will be followed up in the Coumadin Clinic with [facility] in 1 week. This appointment has already in place.    Kingsley Callander. Ouida Sills, MD    ROF/MEDQ  D:  06/18/2012  T:  06/18/2012  Job:  [id number]

## 2012-06-19 ENCOUNTER — Encounter (HOSPITAL_COMMUNITY): Payer: Self-pay | Admitting: Internal Medicine

## 2012-06-19 ENCOUNTER — Telehealth: Payer: Self-pay | Admitting: Cardiology

## 2012-06-19 NOTE — Telephone Encounter (Signed)
noted 

## 2012-06-19 NOTE — Telephone Encounter (Signed)
PT WANTED [place] TO KNOW THAT DR Ouida Sills PUT HIM ON PROTONIX 45MG . PHARMACY TOLD HIM TO LET us KNOW.

## 2012-06-20 ENCOUNTER — Encounter (HOSPITAL_COMMUNITY): Payer: Medicare Other

## 2012-06-23 ENCOUNTER — Encounter (HOSPITAL_COMMUNITY): Payer: Medicare Other

## 2012-06-24 DIAGNOSIS — D649 Anemia, unspecified: Secondary | ICD-10-CM | POA: Diagnosis not present

## 2012-06-25 ENCOUNTER — Encounter (HOSPITAL_COMMUNITY): Payer: Medicare Other

## 2012-06-26 ENCOUNTER — Ambulatory Visit (INDEPENDENT_AMBULATORY_CARE_PROVIDER_SITE_OTHER): Payer: Medicare Other | Admitting: *Deleted

## 2012-06-26 DIAGNOSIS — Z7901 Long term (current) use of anticoagulants: Secondary | ICD-10-CM | POA: Diagnosis not present

## 2012-06-26 DIAGNOSIS — I4891 Unspecified atrial fibrillation: Secondary | ICD-10-CM | POA: Diagnosis not present

## 2012-06-26 LAB — POCT INR: INR: 1.8

## 2012-06-27 ENCOUNTER — Encounter (HOSPITAL_COMMUNITY): Payer: Medicare Other

## 2012-06-30 ENCOUNTER — Encounter (HOSPITAL_COMMUNITY): Payer: Medicare Other

## 2012-07-02 ENCOUNTER — Encounter (HOSPITAL_COMMUNITY): Payer: Medicare Other

## 2012-07-02 DIAGNOSIS — D649 Anemia, unspecified: Secondary | ICD-10-CM | POA: Diagnosis not present

## 2012-07-02 DIAGNOSIS — Z79899 Other long term (current) drug therapy: Secondary | ICD-10-CM | POA: Diagnosis not present

## 2012-07-03 ENCOUNTER — Ambulatory Visit (INDEPENDENT_AMBULATORY_CARE_PROVIDER_SITE_OTHER): Payer: Medicare Other | Admitting: *Deleted

## 2012-07-03 DIAGNOSIS — Z7901 Long term (current) use of anticoagulants: Secondary | ICD-10-CM | POA: Diagnosis not present

## 2012-07-03 DIAGNOSIS — I4891 Unspecified atrial fibrillation: Secondary | ICD-10-CM | POA: Diagnosis not present

## 2012-07-03 DIAGNOSIS — K259 Gastric ulcer, unspecified as acute or chronic, without hemorrhage or perforation: Secondary | ICD-10-CM | POA: Diagnosis not present

## 2012-07-03 LAB — POCT INR: INR: 2.1

## 2012-07-04 ENCOUNTER — Encounter (HOSPITAL_COMMUNITY): Payer: Medicare Other

## 2012-07-07 ENCOUNTER — Encounter (HOSPITAL_COMMUNITY): Payer: Medicare Other

## 2012-07-09 ENCOUNTER — Encounter (HOSPITAL_COMMUNITY): Payer: Medicare Other

## 2012-07-17 ENCOUNTER — Ambulatory Visit (INDEPENDENT_AMBULATORY_CARE_PROVIDER_SITE_OTHER): Payer: Medicare Other | Admitting: *Deleted

## 2012-07-17 DIAGNOSIS — I4891 Unspecified atrial fibrillation: Secondary | ICD-10-CM | POA: Diagnosis not present

## 2012-07-17 DIAGNOSIS — Z7901 Long term (current) use of anticoagulants: Secondary | ICD-10-CM | POA: Diagnosis not present

## 2012-07-22 ENCOUNTER — Other Ambulatory Visit: Payer: Self-pay | Admitting: Cardiothoracic Surgery

## 2012-07-22 DIAGNOSIS — J90 Pleural effusion, not elsewhere classified: Secondary | ICD-10-CM

## 2012-07-22 DIAGNOSIS — I2510 Atherosclerotic heart disease of native coronary artery without angina pectoris: Secondary | ICD-10-CM

## 2012-07-23 ENCOUNTER — Encounter: Payer: Self-pay | Admitting: Cardiothoracic Surgery

## 2012-07-23 ENCOUNTER — Telehealth: Payer: Self-pay | Admitting: *Deleted

## 2012-07-23 ENCOUNTER — Ambulatory Visit
Admission: RE | Admit: 2012-07-23 | Discharge: 2012-07-23 | Disposition: A | Discharge status: Home / Self Care | Payer: Medicare Other | Source: Ambulatory Visit | Attending: Cardiothoracic Surgery | Admitting: Cardiothoracic Surgery

## 2012-07-23 ENCOUNTER — Ambulatory Visit (INDEPENDENT_AMBULATORY_CARE_PROVIDER_SITE_OTHER): Payer: Medicare Other | Admitting: Cardiothoracic Surgery

## 2012-07-23 VITALS — BP 118/79 | HR 84 | Resp 20 | Ht 69.0 in | Wt 220.0 lb

## 2012-07-23 DIAGNOSIS — I712 Thoracic aortic aneurysm, without rupture, unspecified: Secondary | ICD-10-CM

## 2012-07-23 DIAGNOSIS — Z951 Presence of aortocoronary bypass graft: Secondary | ICD-10-CM | POA: Diagnosis not present

## 2012-07-23 DIAGNOSIS — D649 Anemia, unspecified: Secondary | ICD-10-CM | POA: Diagnosis not present

## 2012-07-23 DIAGNOSIS — J90 Pleural effusion, not elsewhere classified: Secondary | ICD-10-CM

## 2012-07-23 DIAGNOSIS — I359 Nonrheumatic aortic valve disorder, unspecified: Secondary | ICD-10-CM | POA: Diagnosis not present

## 2012-07-23 DIAGNOSIS — I4891 Unspecified atrial fibrillation: Secondary | ICD-10-CM

## 2012-07-23 DIAGNOSIS — Z950 Presence of cardiac pacemaker: Secondary | ICD-10-CM | POA: Diagnosis not present

## 2012-07-23 DIAGNOSIS — E119 Type 2 diabetes mellitus without complications: Secondary | ICD-10-CM | POA: Diagnosis not present

## 2012-07-23 NOTE — Telephone Encounter (Signed)
Atrial fibrillation is not new. Please see my last office note. He has been on Coumadin, and apparently this has been stopped due to GI bleed. Hopefully we can keep him on aspirin.

## 2012-07-23 NOTE — Telephone Encounter (Signed)
Received TFC from Dr Alla German, advising that patient was seen in the office today, with new diagnosis of atrial fib.  Requested that patient be scheduled for an earlier appointment, which is in the process of being arranged by Kenney Houseman.

## 2012-07-23 NOTE — Progress Notes (Signed)
PCP is Carylon Perches, MD Referring Provider is Carylon Perches, MD  Chief Complaint  Patient presents with  . Routine Post Op    3 month f/u with CXR, S/P Bentall, CABG x 3 on 01/24/12    HPI: The patient is 78 years old and returns for 6 month followup after a CABG- Bentall procedure with a biologic pericardial valve and replacement of a ascending fusiform aneurysm. Postoperative heart block required a permanent pacemaker.  The patient was admitted to [facility] for upper GI bleeding one month ago treated with endoscopy and cautery. His aspirin was stopped. He is on oral iron. He is doing well now without symptoms of angina or CHF. There no symptoms of GI bleeding.  However his heart rate today is atrial fibrillation with controlled rate 90s per minute. No evidence of CHF ankle edema. Chest x-ray shows resolution of his previously seen moderate left pleural effusion. Past Medical History  Diagnosis Date  . Essential hypertension, benign   . DJD (degenerative joint disease)   . PMR (polymyalgia rheumatica)   . Gout   . Arthritis   . Aortic stenosis     Bioprosthetic AVR  . Hx of adenomatous colonic polyps   . Aneurysm of thoracic aorta     Ascending. 5.7 x 5.5cm - Bentall procedure  . Coronary atherosclerosis of native coronary artery     Multivessel - LIMA to LAD, SVG to OM, SVG to PDA  . H/O hiatal hernia   . Esophageal dysmotility   . Fibromyalgia   . Complete heart block     Status post pacemaker placement  . Atrial fibrillation     Documented on pacer interrogation 6/13    Past Surgical History  Procedure Date  . Bilateral knee arthroscopy   . Appendectomy   . Hemorroidectomy   . Left inguinal hernia repair   . Hernia repair   . Back surgery     1965 or 1966 lumbar back surgery  . Bentall procedure 01/24/2012    Procedure: BENTALL PROCEDURE;  Surgeon: Kerin Perna, MD;  Location: [facility] OR;  Service: Open Heart Surgery;  Laterality: N/A;  . Coronary artery bypass graft  01/24/2012    Procedure: CORONARY ARTERY BYPASS GRAFTING (CABG);  Surgeon: Kerin Perna, MD;  Location: [facility] OR;  Service: Open Heart Surgery;  Laterality: N/A;  . Pacemaker placement     Medtronic 4/13 - Dr. Johney Frame  . Aortic valve replacement 01/24/12  . Esophagogastroduodenoscopy 06/17/2012    Procedure: ESOPHAGOGASTRODUODENOSCOPY (EGD);  Surgeon: Malissa Hippo, MD;  [facility];  Service: Endoscopy;  Laterality: N/A;  ED    Family History  Problem Relation Age of Onset  . Kidney disease Mother   . Heart disease Father   . Anesthesia problems Neg Hx   . Hypotension Neg Hx   . Malignant hyperthermia Neg Hx   . Pseudochol deficiency Neg Hx     Social History History  Substance Use Topics  . Smoking status: Never Smoker   . Smokeless tobacco: Never Used  . Alcohol Use: No    Current Outpatient Prescriptions  Medication Sig Dispense Refill  . ferrous gluconate (FERGON) 325 MG tablet Take 325 mg by mouth daily with breakfast.      . furosemide (LASIX) 40 MG tablet Take 60 mg by mouth daily. 60 mg total  daily        . metoprolol succinate (TOPROL-XL) 25 MG 24 hr tablet Take 25 mg by mouth Daily.      Marland Kitchen  pantoprazole (PROTONIX) 40 MG tablet Take 1 tablet (40 mg total) by mouth daily.  30 tablet  4  . warfarin (COUMADIN) 5 MG tablet Take 2.5-5 mg by mouth daily. Takes a whole tablet everyday besides on Wednesday when he takes a half of a tablet.        No Known Allergies  Review of Systems no fever no chest pains no shortness of breath   his atrial fibrillation probably is making him somewhat weak feeling  BP 118/79  Pulse 84  Resp 20  Ht 5\' 9"  (1.753 m)  Wt 220 lb (99.791 kg)  BMI 32.49 kg/m2  SpO2 98% Physical Exam Alert and pleasant Lungs clear Heart rate irregular no murmur Trace pedal edema  Diagnostic Tests: Chest x-ray shows resolution of the left pleural effusion  Impression:  Doing well 6 months after CABG-biologic Bentall procedure with  current atrial fibrillation, rate controlled on a beta blocker. Recent GI bleed with contraindicate Coumadin. Dr. Ival Bible office called to move up his appointment this month to evaluate his arrhythmia. Plan: Return for CT scan of the chest in 6 months to evaluate aortic repair.

## 2012-07-25 ENCOUNTER — Encounter: Payer: Self-pay | Admitting: Cardiology

## 2012-07-25 ENCOUNTER — Ambulatory Visit (INDEPENDENT_AMBULATORY_CARE_PROVIDER_SITE_OTHER): Payer: Medicare Other | Admitting: Cardiology

## 2012-07-25 VITALS — BP 118/80 | HR 90 | Wt 218.0 lb

## 2012-07-25 DIAGNOSIS — I4891 Unspecified atrial fibrillation: Secondary | ICD-10-CM | POA: Diagnosis not present

## 2012-07-25 NOTE — Progress Notes (Signed)
Clinical Summary [name] is a 78 y.o.male referred back to the office by Dr. Donata Clay. He was seen this week for TCTS followup and there were concerns about treatment of his atrial fibrillation. Recent hospitalization at [facility] reviewed with upper GIB, endoscopy showing ulcer at GE junction and AVM which was treated. Dr. Karilyn Cota cleared him to resume Coumadin, ASA was held at that point.  Patient indicates that concern was for heart rate control - it was 84 at the last visit and 90 today. He has no palpitations or progressive breathlessness. Still has not built back up after GIB but is on iron and slowly improving.  I had him walk in hall and his heart rate went up to 110 range.   No Known Allergies  Current Outpatient Prescriptions  Medication Sig Dispense Refill  . furosemide (LASIX) 40 MG tablet Take 60 mg by mouth daily. 60 mg total  daily        . iron polysaccharides (NIFEREX) 150 MG capsule Take 150 mg by mouth 2 (two) times daily.      . metoprolol succinate (TOPROL-XL) 25 MG 24 hr tablet Take 25 mg by mouth Daily.      . pantoprazole (PROTONIX) 40 MG tablet Take 1 tablet (40 mg total) by mouth daily.  30 tablet  4  . warfarin (COUMADIN) 5 MG tablet Take 2.5-5 mg by mouth daily. Takes a whole tablet everyday besides on Wednesday when he takes a half of a tablet.        Past Medical History  Diagnosis Date  . Essential hypertension, benign   . DJD (degenerative joint disease)   . PMR (polymyalgia rheumatica)   . Gout   . Arthritis   . Aortic stenosis     Bioprosthetic AVR  . Hx of adenomatous colonic polyps   . Aneurysm of thoracic aorta     Ascending. 5.7 x 5.5cm - Bentall procedure  . Coronary atherosclerosis of native coronary artery     Multivessel - LIMA to LAD, SVG to OM, SVG to PDA  . H/O hiatal hernia   . Esophageal dysmotility   . Fibromyalgia   . Complete heart block     Status post pacemaker placement  . Atrial fibrillation     Documented on pacer  interrogation 6/13    Past Surgical History  Procedure Date  . Bilateral knee arthroscopy   . Appendectomy   . Hemorroidectomy   . Left inguinal hernia repair   . Hernia repair   . Back surgery     1965 or 1966 lumbar back surgery  . Bentall procedure 01/24/2012    Procedure: BENTALL PROCEDURE;  Surgeon: Kerin Perna, MD;  Location: [facility] OR;  Service: Open Heart Surgery;  Laterality: N/A;  . Coronary artery bypass graft 01/24/2012    Procedure: CORONARY ARTERY BYPASS GRAFTING (CABG);  Surgeon: Kerin Perna, MD;  Location: [facility] OR;  Service: Open Heart Surgery;  Laterality: N/A;  . Pacemaker placement     Medtronic 4/13 - Dr. Johney Frame  . Aortic valve replacement 01/24/12  . Esophagogastroduodenoscopy 06/17/2012    Procedure: ESOPHAGOGASTRODUODENOSCOPY (EGD);  Surgeon: Malissa Hippo, MD;  [facility];  Service: Endoscopy;  Laterality: N/A;  ED    Social History Douglas [name] reports that he has never smoked. He has never used smokeless tobacco. Douglas [name] reports that he does not drink alcohol.  Review of Systems Negative except as outlined.  Physical Examination Filed Vitals:  07/25/12 0908  BP: 118/80  Pulse: 90   Filed Weights   07/25/12 0908  Weight: 218 lb (98.884 kg)    HEENT: Conjunctiva and lids normal, oropharynx clear.  Neck: Supple, increased girth, no carotid bruits, no thyromegaly.  Thorax: Stable pacer pocket site. Well healing midline sternal incision.  Lungs: Decreased breath sounds, nonlabored breathing at rest.  Cardiac: Irregularly irregular, no S3, soft systolic murmur, no pericardial rub.  Abdomen: Soft, nontender, bowel sounds present, no guarding or rebound.  Extremities: 1+ edema.    Problem List and Plan   Atrial fibrillation Not new diagnosis - he has PPM in place with CHB and is on Toprol XL and Coumadin. I am not sure that his resting heart rate is a problem in terms of symptoms. I asked him to take Toprol XL 25 mg BID for a week to  see if he feels any better - if so can increase going forward, otherwise will keep the same. As far as anticoagulation is concerned, he will stay on Coumadin without ASA - was cleared by Dr. Karilyn Cota after endoscopy. If he was recurrent GIB, we may have to come off Coumadin for good however. Followup CBC with Dr. Ouida Sills.    Jonelle Sidle, M.D., F.A.C.C.

## 2012-07-25 NOTE — Patient Instructions (Addendum)
Your physician recommends that you schedule a follow-up appointment in: 6 week follow up  Your physician has recommended you make the following change in your medication:  1 - Take metoprolol twice a day x 1 week.  If your symptoms improve, call the office and we will change your prescription. 2 - STOP Aspirin

## 2012-07-25 NOTE — Assessment & Plan Note (Signed)
Not new diagnosis - he has PPM in place with CHB and is on Toprol XL and Coumadin. I am not sure that his resting heart rate is a problem in terms of symptoms. I asked him to take Toprol XL 25 mg BID for a week to see if he feels any better - if so can increase going forward, otherwise will keep the same. As far as anticoagulation is concerned, he will stay on Coumadin without ASA - was cleared by Dr. Karilyn Cota after endoscopy. If he was recurrent GIB, we may have to come off Coumadin for good however. Followup CBC with Dr. Ouida Sills.

## 2012-07-29 ENCOUNTER — Encounter: Payer: Self-pay | Admitting: *Deleted

## 2012-07-29 DIAGNOSIS — Z950 Presence of cardiac pacemaker: Secondary | ICD-10-CM

## 2012-07-29 HISTORY — DX: Presence of cardiac pacemaker: Z95.0

## 2012-08-04 ENCOUNTER — Encounter: Payer: Self-pay | Admitting: Internal Medicine

## 2012-08-04 ENCOUNTER — Telehealth: Payer: Self-pay | Admitting: Cardiology

## 2012-08-04 MED ORDER — METOPROLOL SUCCINATE ER 25 MG PO TB24: 25.0000 mg | ORAL_TABLET | Freq: Every day | ORAL | Status: DC

## 2012-08-04 NOTE — Telephone Encounter (Signed)
Noted - thank you. Question was whether he would feel any better on higher dose metoprolol with more aggressive heart rate lowering. If he has not noticed an improvement, would keep his Toprol-XL at 25 mg daily.

## 2012-08-04 NOTE — Telephone Encounter (Signed)
Message left for a return call

## 2012-08-04 NOTE — Telephone Encounter (Signed)
Patient states that his Metoprolol was doubled last week and his heart rate has dropped from the 80's to the 60's.  Also states that he is not feeling much better.  States that he will also need new prescription. / tg

## 2012-08-04 NOTE — Telephone Encounter (Signed)
Patient states information below.  States that he is not having any new symptoms, just not feeling any better than he was at office visit.  Has not been taking blood pressures at home.

## 2012-08-04 NOTE — Telephone Encounter (Signed)
Patient made aware of recommendations.  Refill sent in for Toprol XL 25 mg daily.  Pt verbalizes understanding.

## 2012-08-08 ENCOUNTER — Encounter (INDEPENDENT_AMBULATORY_CARE_PROVIDER_SITE_OTHER): Payer: Medicare Other | Admitting: *Deleted

## 2012-08-08 ENCOUNTER — Ambulatory Visit (INDEPENDENT_AMBULATORY_CARE_PROVIDER_SITE_OTHER): Payer: Medicare Other | Admitting: *Deleted

## 2012-08-08 ENCOUNTER — Encounter: Payer: Self-pay | Admitting: Internal Medicine

## 2012-08-08 DIAGNOSIS — I4891 Unspecified atrial fibrillation: Secondary | ICD-10-CM

## 2012-08-08 DIAGNOSIS — I442 Atrioventricular block, complete: Secondary | ICD-10-CM

## 2012-08-08 DIAGNOSIS — Z7901 Long term (current) use of anticoagulants: Secondary | ICD-10-CM

## 2012-08-08 LAB — POCT INR: INR: 2.9

## 2012-08-08 LAB — PACEMAKER DEVICE OBSERVATION
AL IMPEDENCE PM: 515 Ohm
AL THRESHOLD: 0.5 V
BATTERY VOLTAGE: 2.78 V
VENTRICULAR PACING PM: 100

## 2012-08-08 NOTE — Progress Notes (Signed)
PPM check 

## 2012-08-13 ENCOUNTER — Other Ambulatory Visit: Payer: Self-pay | Admitting: *Deleted

## 2012-08-13 ENCOUNTER — Telehealth: Payer: Self-pay | Admitting: *Deleted

## 2012-08-13 MED ORDER — METOPROLOL SUCCINATE ER 25 MG PO TB24: 50.0000 mg | ORAL_TABLET | Freq: Every day | ORAL | Status: DC

## 2012-08-13 NOTE — Telephone Encounter (Signed)
Left message for patient to increase Metoprolol from 25mg  daily to 50mg  daily per Dr. Johney Frame.

## 2012-08-21 ENCOUNTER — Encounter: Payer: Medicare Other | Admitting: Cardiology

## 2012-08-25 ENCOUNTER — Ambulatory Visit (INDEPENDENT_AMBULATORY_CARE_PROVIDER_SITE_OTHER): Payer: Medicare Other | Admitting: *Deleted

## 2012-08-25 DIAGNOSIS — Z954 Presence of other heart-valve replacement: Secondary | ICD-10-CM | POA: Diagnosis not present

## 2012-08-25 DIAGNOSIS — I4891 Unspecified atrial fibrillation: Secondary | ICD-10-CM

## 2012-08-25 DIAGNOSIS — Z952 Presence of prosthetic heart valve: Secondary | ICD-10-CM | POA: Insufficient documentation

## 2012-08-26 ENCOUNTER — Telehealth: Payer: Self-pay | Admitting: Cardiology

## 2012-08-26 ENCOUNTER — Other Ambulatory Visit: Payer: Self-pay | Admitting: *Deleted

## 2012-08-26 DIAGNOSIS — Z7901 Long term (current) use of anticoagulants: Secondary | ICD-10-CM

## 2012-08-26 NOTE — Telephone Encounter (Signed)
PT REQUESTING A HEMOGLOBIN DONE ALSO. PLEASE CALL HIM AND LET HIM KNOW IF WE CAN OR NOT. DR Ouida Sills HAS BEEN CHECKING IT FOR HIM.

## 2012-08-26 NOTE — Telephone Encounter (Signed)
CBC added to next lab draw

## 2012-08-29 ENCOUNTER — Other Ambulatory Visit: Payer: Self-pay | Admitting: *Deleted

## 2012-08-29 DIAGNOSIS — Z7901 Long term (current) use of anticoagulants: Secondary | ICD-10-CM

## 2012-08-29 DIAGNOSIS — I10 Essential (primary) hypertension: Secondary | ICD-10-CM | POA: Diagnosis not present

## 2012-08-30 DIAGNOSIS — I10 Essential (primary) hypertension: Secondary | ICD-10-CM | POA: Diagnosis not present

## 2012-08-30 LAB — CBC
HCT: 33.6 % — ABNORMAL LOW (ref 39.0–52.0)
Hemoglobin: 10.9 g/dL — ABNORMAL LOW (ref 13.0–17.0)
MCHC: 32.4 g/dL (ref 30.0–36.0)

## 2012-08-30 LAB — BASIC METABOLIC PANEL
CO2: 29 mEq/L (ref 19–32)
Calcium: 9.4 mg/dL (ref 8.4–10.5)
Chloride: 106 mEq/L (ref 96–112)
Potassium: 4.4 mEq/L (ref 3.5–5.3)
Sodium: 142 mEq/L (ref 135–145)

## 2012-09-05 ENCOUNTER — Ambulatory Visit (INDEPENDENT_AMBULATORY_CARE_PROVIDER_SITE_OTHER): Payer: Medicare Other | Admitting: Cardiology

## 2012-09-05 ENCOUNTER — Encounter: Payer: Self-pay | Admitting: Cardiology

## 2012-09-05 ENCOUNTER — Other Ambulatory Visit: Payer: Self-pay | Admitting: Cardiology

## 2012-09-05 VITALS — BP 118/66 | HR 73 | Ht 69.0 in | Wt 218.0 lb

## 2012-09-05 DIAGNOSIS — I4891 Unspecified atrial fibrillation: Secondary | ICD-10-CM | POA: Diagnosis not present

## 2012-09-05 DIAGNOSIS — I2510 Atherosclerotic heart disease of native coronary artery without angina pectoris: Secondary | ICD-10-CM

## 2012-09-05 DIAGNOSIS — Z954 Presence of other heart-valve replacement: Secondary | ICD-10-CM

## 2012-09-05 DIAGNOSIS — Z952 Presence of prosthetic heart valve: Secondary | ICD-10-CM

## 2012-09-05 MED ORDER — METOPROLOL SUCCINATE ER 25 MG PO TB24: 50.0000 mg | ORAL_TABLET | Freq: Every day | ORAL | Status: DC

## 2012-09-05 MED ORDER — WARFARIN SODIUM 5 MG PO TABS: 2.5000 mg | ORAL_TABLET | Freq: Every day | ORAL | Status: DC

## 2012-09-05 MED ORDER — PANTOPRAZOLE SODIUM 40 MG PO TBEC: 40.0000 mg | DELAYED_RELEASE_TABLET | Freq: Every day | ORAL | Status: DC

## 2012-09-05 NOTE — Assessment & Plan Note (Signed)
ECG today shows ventricular paced rhythm with underlying atrial fibrillation. Continue current dose of Toprol-XL at 50 mg daily plus Coumadin. Recent device parameter changes noted. Patient is doing well symptomatically.

## 2012-09-05 NOTE — Assessment & Plan Note (Signed)
No active angina symptoms status post CABG.

## 2012-09-05 NOTE — Progress Notes (Signed)
Clinical Summary Douglas [name] is a 78 y.o.male presenting for followup. He was seen in October. He states that he has been doing very well, has not had any major sense of palpitations, no chest pain, stable dyspnea on exertion. He has had a recent mild episode of dizziness, no syncope. This has not been a recurrent problem.  Followup lab work shows hemoglobin 10.9 up from 8.3, platelets 303, potassium 4.4, BUN 20, creatinine 1.3.  He also had a device interrogation done in October, 32% of the time heart rates were over 100 beats per minute. Toprol-XL was increased back to 50 mg daily based on this, although initially he did not report any significant improvement with this dose. Device parameters were also changed, and he states he has been feeling better.   No Known Allergies  Current Outpatient Prescriptions  Medication Sig Dispense Refill  . iron polysaccharides (NIFEREX) 150 MG capsule Take 150 mg by mouth daily.       Marland Kitchen LASIX 40 MG tablet TAKE ONE AND 1/2 TABLET DAILY.  45 tablet  3  . metoprolol succinate (TOPROL-XL) 25 MG 24 hr tablet Take 2 tablets (50 mg total) by mouth daily. 10/23 Dosage increase to 50mg  daily per Dr. Johney Frame  60 tablet  0  . pantoprazole (PROTONIX) 40 MG tablet Take 1 tablet (40 mg total) by mouth daily.  30 tablet  4  . warfarin (COUMADIN) 5 MG tablet Take 2.5-5 mg by mouth daily. Takes a whole tablet everyday besides on Wednesday when he takes a half of a tablet.      . warfarin (COUMADIN) 5 MG tablet TAKE ONE TABLET DAILY.  90 tablet  3  . [DISCONTINUED] furosemide (LASIX) 40 MG tablet Take 60 mg by mouth daily. 60 mg total  daily          Past Medical History  Diagnosis Date  . Essential hypertension, benign   . DJD (degenerative joint disease)   . PMR (polymyalgia rheumatica)   . Gout   . Arthritis   . Aortic stenosis     Bioprosthetic AVR  . Hx of adenomatous colonic polyps   . Aneurysm of thoracic aorta     Ascending. 5.7 x 5.5cm - Bentall  procedure  . Coronary atherosclerosis of native coronary artery     Multivessel - LIMA to LAD, SVG to OM, SVG to PDA  . H/O hiatal hernia   . Esophageal dysmotility   . Fibromyalgia   . Complete heart block     Status post pacemaker placement  . Atrial fibrillation     Documented on pacer interrogation 6/13    Past Surgical History  Procedure Date  . Bilateral knee arthroscopy   . Appendectomy   . Hemorroidectomy   . Left inguinal hernia repair   . Hernia repair   . Back surgery     1965 or 1966 lumbar back surgery  . Bentall procedure 01/24/2012    Procedure: BENTALL PROCEDURE;  Surgeon: Kerin Perna, MD;  Location: [facility] OR;  Service: Open Heart Surgery;  Laterality: N/A;  . Coronary artery bypass graft 01/24/2012    Procedure: CORONARY ARTERY BYPASS GRAFTING (CABG);  Surgeon: Kerin Perna, MD;  Location: [facility] OR;  Service: Open Heart Surgery;  Laterality: N/A;  . Pacemaker placement     Medtronic 4/13 - Dr. Johney Frame  . Aortic valve replacement 01/24/12  . Esophagogastroduodenoscopy 06/17/2012    Procedure: ESOPHAGOGASTRODUODENOSCOPY (EGD);  Surgeon: Malissa Hippo, MD;  [facility];  Service: Endoscopy;  Laterality: N/A;  ED    Social History Mr. Tinder reports that he has never smoked. He has never used smokeless tobacco. Mr. Tomaro reports that he does not drink alcohol.  Review of Systems Negative except as outlined above.  Physical Examination Filed Vitals:   09/05/12 1416  BP: 118/66  Pulse: 73   Filed Weights   09/05/12 1416  Weight: 218 lb (98.884 kg)    HEENT: Conjunctiva and lids normal, oropharynx clear.  Neck: Supple, increased girth, no carotid bruits, no thyromegaly.  Thorax: Stable pacer pocket site. Well healing midline sternal incision.  Lungs: Decreased breath sounds, nonlabored breathing at rest.  Cardiac: RRR, no S3, soft systolic murmur, no pericardial rub.  Abdomen: Soft, nontender, bowel sounds present, no guarding or rebound.    Extremities: 1+ edema.    Problem List and Plan   Atrial fibrillation ECG today shows ventricular paced rhythm with underlying atrial fibrillation. Continue current dose of Toprol-XL at 50 mg daily plus Coumadin. Recent device parameter changes noted. Patient is doing well symptomatically.  Coronary atherosclerosis of native coronary artery No active angina symptoms status post CABG.  Aortic valve replaced Status post bioprosthetic AVR.    Jonelle Sidle, M.D., F.A.C.C.

## 2012-09-05 NOTE — Patient Instructions (Addendum)
Your physician recommends that you schedule a follow-up appointment in: 3 MONTHS WITH SM  Your physician recommends that you continue on your current medications as directed. Please refer to the Current Medication list given to you today. 

## 2012-09-05 NOTE — Assessment & Plan Note (Signed)
Status post bioprosthetic AVR. 

## 2012-09-05 NOTE — Addendum Note (Signed)
Addended by: Derry Lory A on: 09/05/2012 03:10 PM   Modules accepted: Orders

## 2012-09-08 DIAGNOSIS — Z23 Encounter for immunization: Secondary | ICD-10-CM | POA: Diagnosis not present

## 2012-09-12 ENCOUNTER — Ambulatory Visit: Payer: Medicare Other | Admitting: Cardiology

## 2012-09-24 ENCOUNTER — Ambulatory Visit (INDEPENDENT_AMBULATORY_CARE_PROVIDER_SITE_OTHER): Payer: Medicare Other | Admitting: *Deleted

## 2012-09-24 DIAGNOSIS — I4891 Unspecified atrial fibrillation: Secondary | ICD-10-CM | POA: Diagnosis not present

## 2012-09-24 DIAGNOSIS — Z7901 Long term (current) use of anticoagulants: Secondary | ICD-10-CM

## 2012-09-24 DIAGNOSIS — Z954 Presence of other heart-valve replacement: Secondary | ICD-10-CM

## 2012-09-24 DIAGNOSIS — Z952 Presence of prosthetic heart valve: Secondary | ICD-10-CM

## 2012-09-24 LAB — POCT INR: INR: 2.7

## 2012-10-28 DIAGNOSIS — H524 Presbyopia: Secondary | ICD-10-CM | POA: Diagnosis not present

## 2012-10-28 DIAGNOSIS — H52229 Regular astigmatism, unspecified eye: Secondary | ICD-10-CM | POA: Diagnosis not present

## 2012-10-28 DIAGNOSIS — H5200 Hypermetropia, unspecified eye: Secondary | ICD-10-CM | POA: Diagnosis not present

## 2012-10-28 DIAGNOSIS — H2510 Age-related nuclear cataract, unspecified eye: Secondary | ICD-10-CM | POA: Diagnosis not present

## 2012-10-29 ENCOUNTER — Ambulatory Visit (INDEPENDENT_AMBULATORY_CARE_PROVIDER_SITE_OTHER): Payer: Medicare Other | Admitting: *Deleted

## 2012-10-29 DIAGNOSIS — Z954 Presence of other heart-valve replacement: Secondary | ICD-10-CM | POA: Diagnosis not present

## 2012-10-29 DIAGNOSIS — I4891 Unspecified atrial fibrillation: Secondary | ICD-10-CM

## 2012-10-29 DIAGNOSIS — Z7901 Long term (current) use of anticoagulants: Secondary | ICD-10-CM | POA: Diagnosis not present

## 2012-10-29 DIAGNOSIS — Z952 Presence of prosthetic heart valve: Secondary | ICD-10-CM

## 2012-12-03 NOTE — Progress Notes (Signed)
Cardiac Rehabilitation Program Outcomes Report   Orientation:  03/11/2012 1st week report: 03/28/2012 Graduate Date:  tbd Discharge Date:  tbd # of sessions completed: 3 DX:CAD and Aortic Stenosis  Cardiologist: Nona Dell Family MD:  Carylon Perches Class Time:  09:30  A.  Exercise Program:  Tolerates exercise @ 4.03 METS for 15 minutes  B.  Mental Health:  Good mental attitude  C.  Education/Instruction/Skills  Accurately checks own pulse.  Rest:  71  Exercise:  118, Knows THR for exercise and Uses Perceived Exertion Scale and/or Dyspnea Scale  Uses Perceived Exertion Scale and/or Dyspnea Scale  D.  Nutrition/Weight Control/Body Composition:  Adherence to prescribed nutrition program: good    E.  Blood Lipids    No results found for this basename: CHOL, HDL, LDLCALC, LDLDIRECT, TRIG, CHOLHDL    F.  Lifestyle Changes:  Making positive lifestyle changes  G.  Symptoms noted with exercise:  Asymptomatic  Report Completed By:  Lelon Huh. Yovanna Cogan RN   Comments:  This is patients 1st week report. HE achieved a peak METS of 4.03. His resting HR was 71 and resting BP 128/58, His peak HR was 118 and peak BP was 130/68. A report will follow upon his 18 th visit.

## 2012-12-10 ENCOUNTER — Ambulatory Visit (INDEPENDENT_AMBULATORY_CARE_PROVIDER_SITE_OTHER): Payer: Medicare Other | Admitting: *Deleted

## 2012-12-10 DIAGNOSIS — I4891 Unspecified atrial fibrillation: Secondary | ICD-10-CM | POA: Diagnosis not present

## 2012-12-10 DIAGNOSIS — Z952 Presence of prosthetic heart valve: Secondary | ICD-10-CM

## 2012-12-10 DIAGNOSIS — D649 Anemia, unspecified: Secondary | ICD-10-CM | POA: Diagnosis not present

## 2012-12-10 DIAGNOSIS — Z7901 Long term (current) use of anticoagulants: Secondary | ICD-10-CM | POA: Diagnosis not present

## 2012-12-10 DIAGNOSIS — Z954 Presence of other heart-valve replacement: Secondary | ICD-10-CM | POA: Diagnosis not present

## 2012-12-12 DIAGNOSIS — Z79899 Other long term (current) drug therapy: Secondary | ICD-10-CM | POA: Diagnosis not present

## 2012-12-12 DIAGNOSIS — E785 Hyperlipidemia, unspecified: Secondary | ICD-10-CM | POA: Diagnosis not present

## 2012-12-12 DIAGNOSIS — I10 Essential (primary) hypertension: Secondary | ICD-10-CM | POA: Diagnosis not present

## 2012-12-15 ENCOUNTER — Ambulatory Visit: Payer: Medicare Other | Admitting: Cardiology

## 2012-12-18 ENCOUNTER — Other Ambulatory Visit: Payer: Self-pay | Admitting: Cardiology

## 2012-12-18 DIAGNOSIS — I4891 Unspecified atrial fibrillation: Secondary | ICD-10-CM | POA: Diagnosis not present

## 2012-12-18 DIAGNOSIS — I10 Essential (primary) hypertension: Secondary | ICD-10-CM | POA: Diagnosis not present

## 2012-12-18 DIAGNOSIS — D649 Anemia, unspecified: Secondary | ICD-10-CM | POA: Diagnosis not present

## 2012-12-18 DIAGNOSIS — N390 Urinary tract infection, site not specified: Secondary | ICD-10-CM | POA: Diagnosis not present

## 2012-12-18 DIAGNOSIS — Z1212 Encounter for screening for malignant neoplasm of rectum: Secondary | ICD-10-CM | POA: Diagnosis not present

## 2012-12-24 ENCOUNTER — Ambulatory Visit (INDEPENDENT_AMBULATORY_CARE_PROVIDER_SITE_OTHER): Payer: Medicare Other | Admitting: Cardiology

## 2012-12-24 ENCOUNTER — Encounter: Payer: Self-pay | Admitting: Cardiology

## 2012-12-24 VITALS — BP 126/70 | HR 72 | Ht 68.0 in | Wt 230.0 lb

## 2012-12-24 DIAGNOSIS — I712 Thoracic aortic aneurysm, without rupture: Secondary | ICD-10-CM | POA: Diagnosis not present

## 2012-12-24 DIAGNOSIS — I4891 Unspecified atrial fibrillation: Secondary | ICD-10-CM | POA: Diagnosis not present

## 2012-12-24 DIAGNOSIS — I359 Nonrheumatic aortic valve disorder, unspecified: Secondary | ICD-10-CM

## 2012-12-24 DIAGNOSIS — I10 Essential (primary) hypertension: Secondary | ICD-10-CM

## 2012-12-24 DIAGNOSIS — I350 Nonrheumatic aortic (valve) stenosis: Secondary | ICD-10-CM

## 2012-12-24 DIAGNOSIS — I2510 Atherosclerotic heart disease of native coronary artery without angina pectoris: Secondary | ICD-10-CM | POA: Diagnosis not present

## 2012-12-24 NOTE — Assessment & Plan Note (Signed)
Blood pressure looks good today. 

## 2012-12-24 NOTE — Assessment & Plan Note (Signed)
Status post Bentall procedure.

## 2012-12-24 NOTE — Progress Notes (Signed)
Clinical Summary Douglas [name] is an 79 y.o.male presenting for followup. He was seen in November 2013. He continues to do reasonably well. States that he just had a physical with Douglas [name]. He reports no problems with palpitations or progressive shortness of breath, remains compliant with his medications.  He denies any bleeding problems on Coumadin. His weight has increased and he attributes this to "overactive" appetite. We did discuss diet and a basic walking regimen. He reports no significant edema or orthopnea.   No Known Allergies  Current Outpatient Prescriptions  Medication Sig Dispense Refill  . COLCRYS 0.6 MG tablet Take 0.6 mg by mouth as needed.       . iron polysaccharides (NIFEREX) 150 MG capsule Take 150 mg by mouth daily.       Marland Kitchen LASIX 40 MG tablet TAKE ONE AND 1/2 TABLET DAILY.  45 tablet  3  . metoprolol succinate (TOPROL-XL) 25 MG 24 hr tablet TAKE (2) TABLETS BY MOUTH DAILY.  60 tablet  6  . pantoprazole (PROTONIX) 40 MG tablet Take 1 tablet (40 mg total) by mouth daily.  30 tablet  4  . warfarin (COUMADIN) 5 MG tablet TAKE ONE TABLET DAILY.  90 tablet  3  . warfarin (COUMADIN) 5 MG tablet Take 0.5-1 tablets (2.5-5 mg total) by mouth daily. Takes a whole tablet everyday besides on Wednesday when he takes a half of a tablet.  30 tablet  3   No current facility-administered medications for this visit.    Past Medical History  Diagnosis Date  . Essential hypertension, benign   . DJD (degenerative joint disease)   . PMR (polymyalgia rheumatica)   . Gout   . Arthritis   . Aortic stenosis     Bioprosthetic AVR  . Hx of adenomatous colonic polyps   . Aneurysm of thoracic aorta     Ascending. 5.7 x 5.5cm - Bentall procedure  . Coronary atherosclerosis of native coronary artery     Multivessel - LIMA to LAD, SVG to OM, SVG to PDA  . H/O hiatal hernia   . Esophageal dysmotility   . Fibromyalgia   . Complete heart block     Status post pacemaker placement  . Atrial  fibrillation     Documented on pacer interrogation 6/13    Past Surgical History  Procedure Laterality Date  . Bilateral knee arthroscopy    . Appendectomy    . Hemorroidectomy    . Left inguinal hernia repair    . Hernia repair    . Back surgery      1965 or 1966 lumbar back surgery  . Bentall procedure  01/24/2012    Procedure: BENTALL PROCEDURE;  Surgeon: Kerin Perna, MD;  Location: [facility] OR;  Service: Open Heart Surgery;  Laterality: N/A;  . Coronary artery bypass graft  01/24/2012    Procedure: CORONARY ARTERY BYPASS GRAFTING (CABG);  Surgeon: Kerin Perna, MD;  Location: [facility] OR;  Service: Open Heart Surgery;  Laterality: N/A;  . Pacemaker placement      Medtronic 4/13 - Dr. Johney Frame  . Aortic valve replacement  01/24/12  . Esophagogastroduodenoscopy  06/17/2012    Procedure: ESOPHAGOGASTRODUODENOSCOPY (EGD);  Surgeon: Malissa Hippo, MD;  Location: [facility];  Service: Endoscopy;  Laterality: N/A;  ED    Social History Douglas [name] reports that he has never smoked. He has never used smokeless tobacco. Douglas [name] reports that he does not drink alcohol.  Review of Systems Negative except as  outlined.  Physical Examination Filed Vitals:   12/24/12 0821  BP: 126/70  Pulse: 72   Filed Weights   12/24/12 0821  Weight: 230 lb (104.327 kg)   No acute distress. HEENT: Conjunctiva and lids normal, oropharynx clear.  Neck: Supple, increased girth, no carotid bruits, no thyromegaly.  Thorax: Stable pacer pocket site. Well healing midline sternal incision.  Lungs: Decreased breath sounds, nonlabored breathing at rest.  Cardiac: RRR, no S3, soft systolic murmur, no pericardial rub.  Abdomen: Soft, nontender, bowel sounds present, no guarding or rebound.  Extremities: 1+ edema.    Problem List and Plan   Atrial fibrillation Good heart rate control, paced rhythm. Plan to continue current regimen including Coumadin and Toprol-XL.  Aortic stenosis Status post bioprosthetic  AVR, stable examination.  Aneurysm of thoracic aorta Status post Bentall procedure.  Coronary atherosclerosis of native coronary artery Multivessel, status post CABG. Symptomatically stable. Encouraged diet and exercise.  Essential hypertension, benign Blood pressure looks good today.    Jonelle Sidle, M.D., F.A.C.C.

## 2012-12-24 NOTE — Assessment & Plan Note (Signed)
Status post bioprosthetic AVR, stable examination.

## 2012-12-24 NOTE — Assessment & Plan Note (Signed)
Multivessel, status post CABG. Symptomatically stable. Encouraged diet and exercise.

## 2012-12-24 NOTE — Assessment & Plan Note (Signed)
Good heart rate control, paced rhythm. Plan to continue current regimen including Coumadin and Toprol-XL.

## 2012-12-24 NOTE — Patient Instructions (Addendum)
Your physician recommends that you schedule a follow-up appointment in: 6 MONTHS 

## 2013-01-09 ENCOUNTER — Other Ambulatory Visit: Payer: Self-pay | Admitting: *Deleted

## 2013-01-09 DIAGNOSIS — I712 Thoracic aortic aneurysm, without rupture: Secondary | ICD-10-CM

## 2013-01-12 ENCOUNTER — Other Ambulatory Visit: Payer: Self-pay | Admitting: Cardiology

## 2013-01-13 ENCOUNTER — Other Ambulatory Visit: Payer: Self-pay | Admitting: *Deleted

## 2013-01-13 DIAGNOSIS — I712 Thoracic aortic aneurysm, without rupture: Secondary | ICD-10-CM

## 2013-01-13 DIAGNOSIS — I359 Nonrheumatic aortic valve disorder, unspecified: Secondary | ICD-10-CM

## 2013-01-21 ENCOUNTER — Ambulatory Visit (INDEPENDENT_AMBULATORY_CARE_PROVIDER_SITE_OTHER): Payer: Medicare Other | Admitting: *Deleted

## 2013-01-21 DIAGNOSIS — Z7901 Long term (current) use of anticoagulants: Secondary | ICD-10-CM

## 2013-01-21 DIAGNOSIS — Z954 Presence of other heart-valve replacement: Secondary | ICD-10-CM | POA: Diagnosis not present

## 2013-01-21 DIAGNOSIS — I4891 Unspecified atrial fibrillation: Secondary | ICD-10-CM

## 2013-01-21 DIAGNOSIS — Z952 Presence of prosthetic heart valve: Secondary | ICD-10-CM

## 2013-02-10 ENCOUNTER — Other Ambulatory Visit: Payer: Self-pay | Admitting: Cardiothoracic Surgery

## 2013-02-10 DIAGNOSIS — I712 Thoracic aortic aneurysm, without rupture: Secondary | ICD-10-CM | POA: Diagnosis not present

## 2013-02-11 ENCOUNTER — Ambulatory Visit
Admission: RE | Admit: 2013-02-11 | Discharge: 2013-02-11 | Disposition: A | Discharge status: Home / Self Care | Payer: Medicare Other | Source: Ambulatory Visit | Attending: Cardiothoracic Surgery | Admitting: Cardiothoracic Surgery

## 2013-02-11 ENCOUNTER — Ambulatory Visit (INDEPENDENT_AMBULATORY_CARE_PROVIDER_SITE_OTHER): Payer: Medicare Other | Admitting: Cardiothoracic Surgery

## 2013-02-11 ENCOUNTER — Encounter: Payer: Self-pay | Admitting: Cardiothoracic Surgery

## 2013-02-11 VITALS — BP 139/78 | HR 74 | Resp 20 | Ht 68.0 in | Wt 230.0 lb

## 2013-02-11 DIAGNOSIS — Z9889 Other specified postprocedural states: Secondary | ICD-10-CM | POA: Diagnosis not present

## 2013-02-11 DIAGNOSIS — Z09 Encounter for follow-up examination after completed treatment for conditions other than malignant neoplasm: Secondary | ICD-10-CM | POA: Diagnosis not present

## 2013-02-11 DIAGNOSIS — I359 Nonrheumatic aortic valve disorder, unspecified: Secondary | ICD-10-CM

## 2013-02-11 DIAGNOSIS — I712 Thoracic aortic aneurysm, without rupture: Secondary | ICD-10-CM

## 2013-02-11 DIAGNOSIS — Z951 Presence of aortocoronary bypass graft: Secondary | ICD-10-CM

## 2013-02-11 DIAGNOSIS — Z8679 Personal history of other diseases of the circulatory system: Secondary | ICD-10-CM

## 2013-02-11 LAB — BUN: BUN: 24 mg/dL — ABNORMAL HIGH (ref 6–23)

## 2013-02-11 LAB — CREATININE, SERUM: Creat: 1.6 mg/dL — ABNORMAL HIGH (ref 0.50–1.35)

## 2013-02-11 MED ORDER — IOHEXOL 350 MG/ML SOLN
80.0000 mL | Freq: Once | INTRAVENOUS | Status: AC | PRN
  Administered 2013-02-11: 80 mL via INTRAVENOUS

## 2013-02-11 NOTE — Progress Notes (Signed)
PCP is Carylon Perches, MD Referring Provider is Carylon Perches, MD  Chief Complaint  Patient presents with  . Follow-up    6 month f/u with CTA Chest,  post-op check on aortic repair    HPI: 1 year surgical followup after by biologic-Bentall procedure with tissue aVR and replacement of aortic root and ascending aorta with a heme shield graft, combined CABG x3. He required postop pacemaker because of slow atrial fibrillation. He is been on Coumadin. He had a GI bleed several months ago which is resolved after stopping his aspirin. He denies symptoms of CHF or angina. He denies edema. He remains quite active.  The patient has a recent history over the past 2 days of bilateral knee soreness which improved after he took colchicine.  The patient had a CTA one year postop to assess the aortic repair. His root replacement is intact. His distal ascending aorta is 4.5 cm. No suspicious pulmonary nodules or adenopathy noted.  Past Medical History  Diagnosis Date  . Essential hypertension, benign   . DJD (degenerative joint disease)   . PMR (polymyalgia rheumatica)   . Gout   . Arthritis   . Aortic stenosis     Bioprosthetic AVR  . Hx of adenomatous colonic polyps   . Aneurysm of thoracic aorta     Ascending. 5.7 x 5.5cm - Bentall procedure  . Coronary atherosclerosis of native coronary artery     Multivessel - LIMA to LAD, SVG to OM, SVG to PDA  . H/O hiatal hernia   . Esophageal dysmotility   . Fibromyalgia   . Complete heart block     Status post pacemaker placement  . Atrial fibrillation     Documented on pacer interrogation 6/13    Past Surgical History  Procedure Laterality Date  . Bilateral knee arthroscopy    . Appendectomy    . Hemorroidectomy    . Left inguinal hernia repair    . Hernia repair    . Back surgery      1965 or 1966 lumbar back surgery  . Bentall procedure  01/24/2012    Procedure: BENTALL PROCEDURE;  Surgeon: Kerin Perna, MD;  Location: [facility] OR;  Service: Open  Heart Surgery;  Laterality: N/A;  . Coronary artery bypass graft  01/24/2012    Procedure: CORONARY ARTERY BYPASS GRAFTING (CABG);  Surgeon: Kerin Perna, MD;  Location: [facility] OR;  Service: Open Heart Surgery;  Laterality: N/A;  . Pacemaker placement      Medtronic 4/13 - Dr. Johney Frame  . Aortic valve replacement  01/24/12  . Esophagogastroduodenoscopy  06/17/2012    Procedure: ESOPHAGOGASTRODUODENOSCOPY (EGD);  Surgeon: Malissa Hippo, MD;  Location: [facility];  Service: Endoscopy;  Laterality: N/A;  ED    Family History  Problem Relation Age of Onset  . Kidney disease Mother   . Heart disease Father   . Anesthesia problems Neg Hx   . Hypotension Neg Hx   . Malignant hyperthermia Neg Hx   . Pseudochol deficiency Neg Hx     Social History History  Substance Use Topics  . Smoking status: Never Smoker   . Smokeless tobacco: Never Used  . Alcohol Use: No    Current Outpatient Prescriptions  Medication Sig Dispense Refill  . COLCRYS 0.6 MG tablet Take 0.6 mg by mouth as needed.       . furosemide (LASIX) 40 MG tablet TAKE ONE AND 1/2 TABLET DAILY.  45 tablet  3  . iron polysaccharides (NIFEREX)  150 MG capsule Take 150 mg by mouth daily.       . metoprolol succinate (TOPROL-XL) 25 MG 24 hr tablet TAKE (2) TABLETS BY MOUTH DAILY.  60 tablet  6  . pantoprazole (PROTONIX) 40 MG tablet TAKE ONE TABLET BY MOUTH ONCE DAILY IN THE MORNING.  30 tablet  1  . warfarin (COUMADIN) 5 MG tablet TAKE ONE TABLET DAILY.  90 tablet  3  . warfarin (COUMADIN) 5 MG tablet Take 0.5-1 tablets (2.5-5 mg total) by mouth daily. Takes a whole tablet everyday besides on Wednesday when he takes a half of a tablet.  30 tablet  3   No current facility-administered medications for this visit.    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 BP 139/78  Pulse 74  Resp 20  Ht 5\' 8"  (1.727  m)  Wt 230 lb (104.327 kg)  BMI 34.98 kg/m2  SpO2 97% Physical Exam Alert and comfortable Lungs clear Soft flow murmur through aortic valve no murmur of AI Sternum well-healed Paced rhythm, regular Minimal pedal edema  Diagnostic Tests: CTA of the thoracic aorta shows intact aortic repair, no significant pleural effusion, distal ascending aorta proximal arch measures 4.5 cm.  Impression: Doing well one month after surgery. Continue current medications. Importance of blood pressure control emphasized to the patient. Will return here as needed.  Plan: Return when necessary

## 2013-03-04 ENCOUNTER — Ambulatory Visit (INDEPENDENT_AMBULATORY_CARE_PROVIDER_SITE_OTHER): Payer: Medicare Other | Admitting: *Deleted

## 2013-03-04 DIAGNOSIS — Z954 Presence of other heart-valve replacement: Secondary | ICD-10-CM | POA: Diagnosis not present

## 2013-03-04 DIAGNOSIS — I4891 Unspecified atrial fibrillation: Secondary | ICD-10-CM | POA: Diagnosis not present

## 2013-03-04 DIAGNOSIS — Z7901 Long term (current) use of anticoagulants: Secondary | ICD-10-CM | POA: Diagnosis not present

## 2013-03-04 DIAGNOSIS — Z952 Presence of prosthetic heart valve: Secondary | ICD-10-CM

## 2013-03-04 LAB — POCT INR: INR: 2.7

## 2013-03-06 DIAGNOSIS — M109 Gout, unspecified: Secondary | ICD-10-CM | POA: Diagnosis not present

## 2013-03-06 DIAGNOSIS — D509 Iron deficiency anemia, unspecified: Secondary | ICD-10-CM | POA: Diagnosis not present

## 2013-03-07 ENCOUNTER — Other Ambulatory Visit: Payer: Self-pay | Admitting: Cardiology

## 2013-03-12 IMAGING — RF DG ESOPHAGUS
7 of 11 series · 14 of 24 positions shown · non-contrast
Comparison: 01/29/2012 chest radiograph

CLINICAL DATA: Transesophageal echo, rule out esophageal injury.

ESOPHOGRAM / BARIUM SWALLOW
TECHNIQUE: Initially, about 50 ml of Cmnipaque-V22 was administered
orally to the patient.  When this did not demonstrate a significant
tear, thin barium was administered to the patient in order to
assess the esophagus for tear, given its better coating properties.
Today's exam was performed in the upright position.  The patient is
somewhat frail, and the standard positional changes associated with
a typical esophagram were not employed.
Fluoroscopy time:  1.0 minutes.

[Series 1: run · 1 of 1 slices shown (1 of 7)]
[im 1/1]
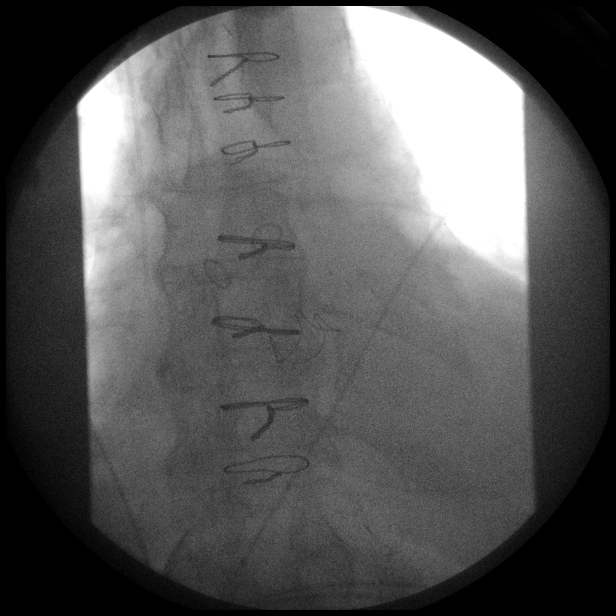

[Series 3: run · 1 of 1 slices shown (2 of 7)]
[im 1/1]
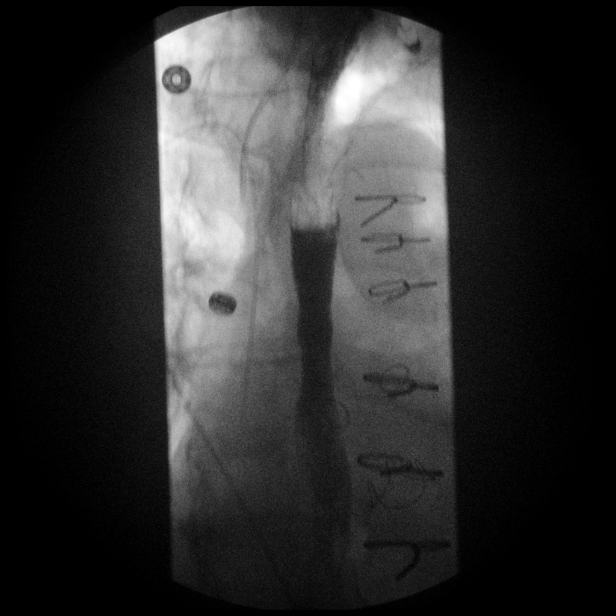

[Series 5: run · 1 of 1 slices shown (3 of 7)]
[im 1/1]
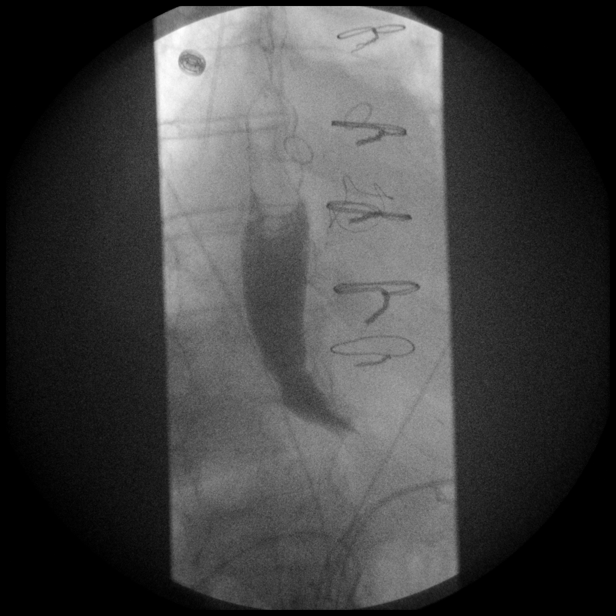

[Series 7: run · 2 of 14 slices shown (4 of 7)]
[im 1/14]
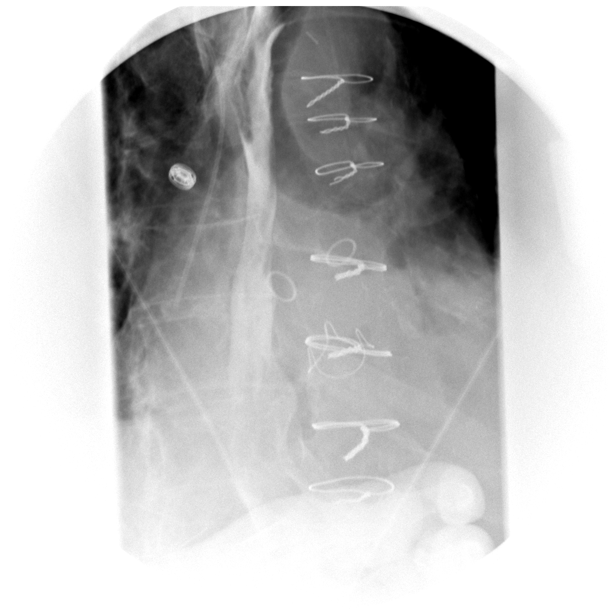
[im 14/14]
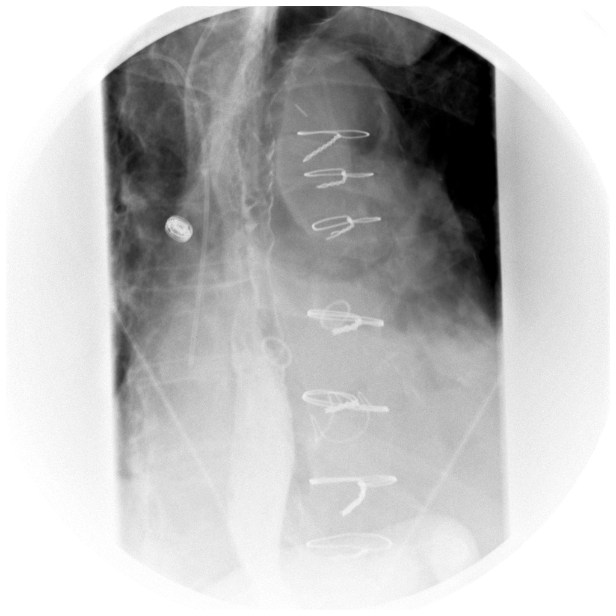

[Series 9: run · 7 of 48 slices shown (5 of 7)]
[im 1/48]
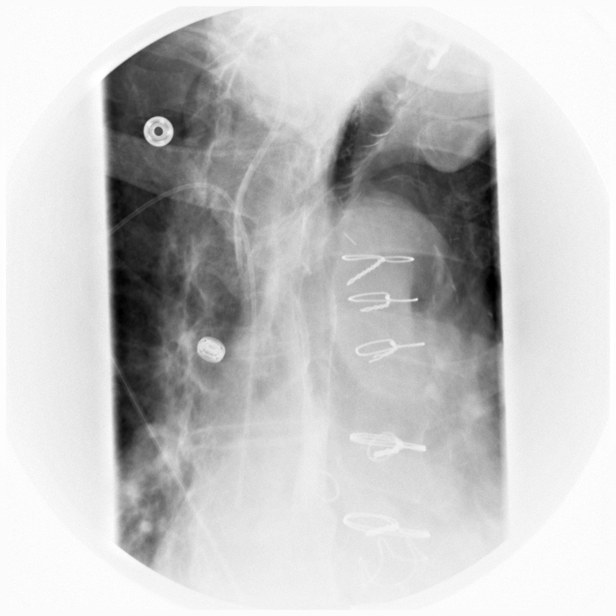
[im 9/48]
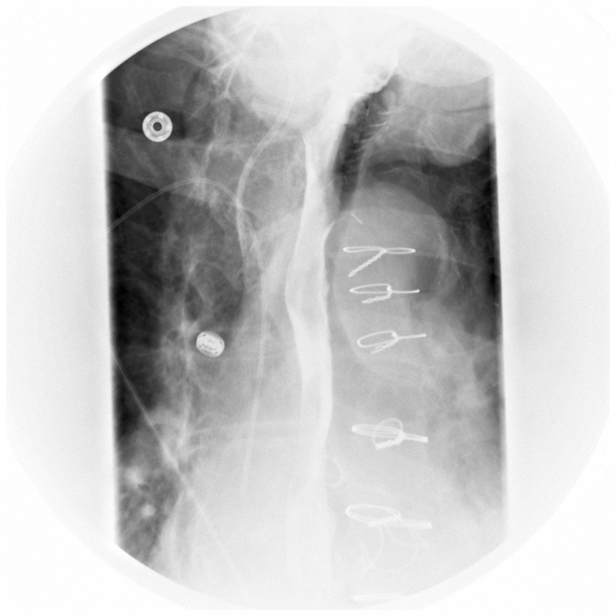
[im 13/48]
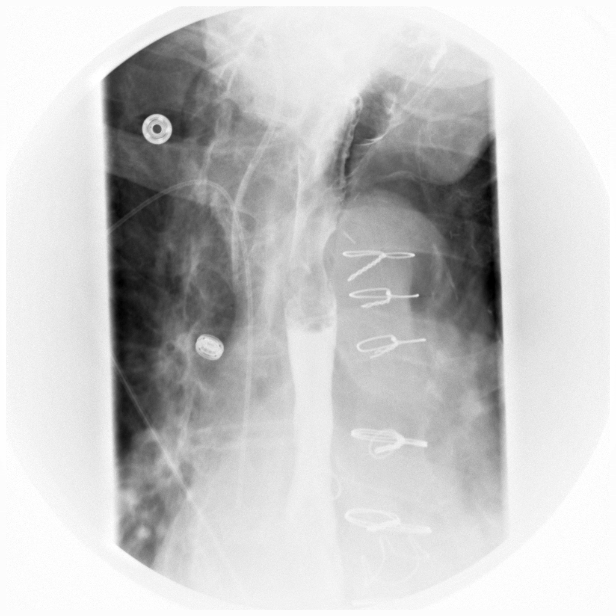
[im 22/48]
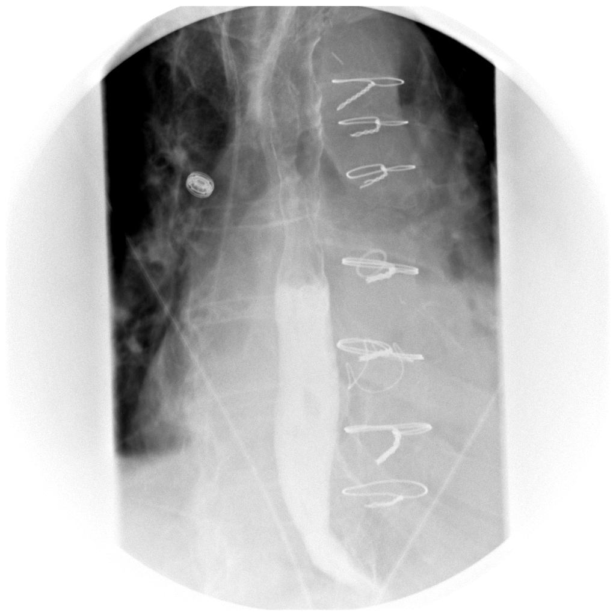
[im 30/48]
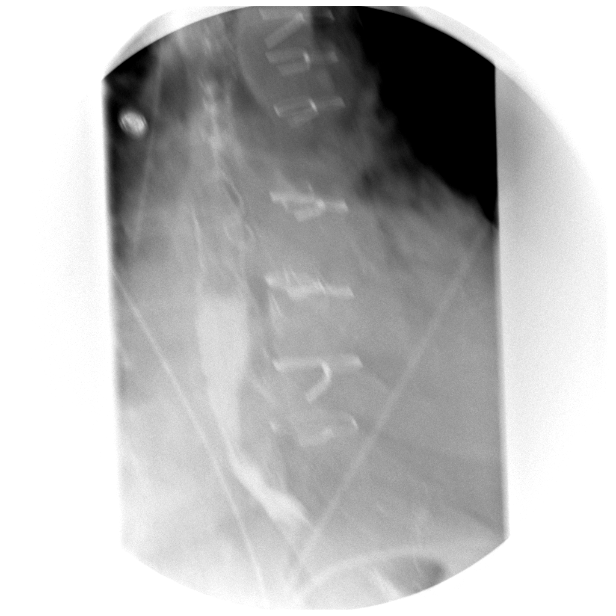
[im 39/48]
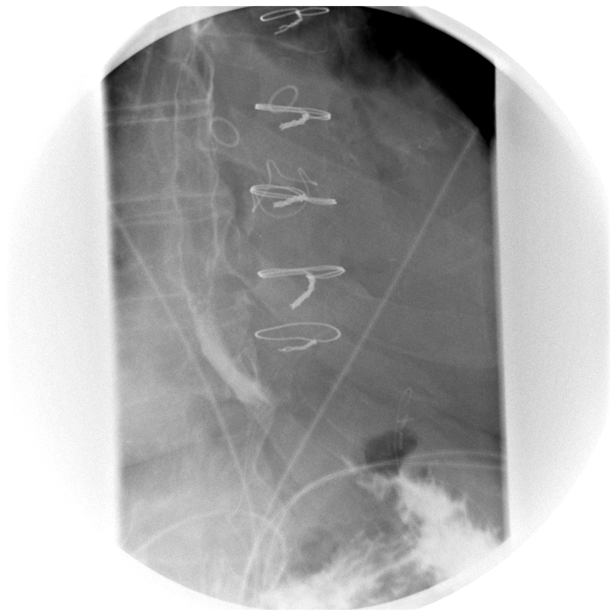
[im 43/48]
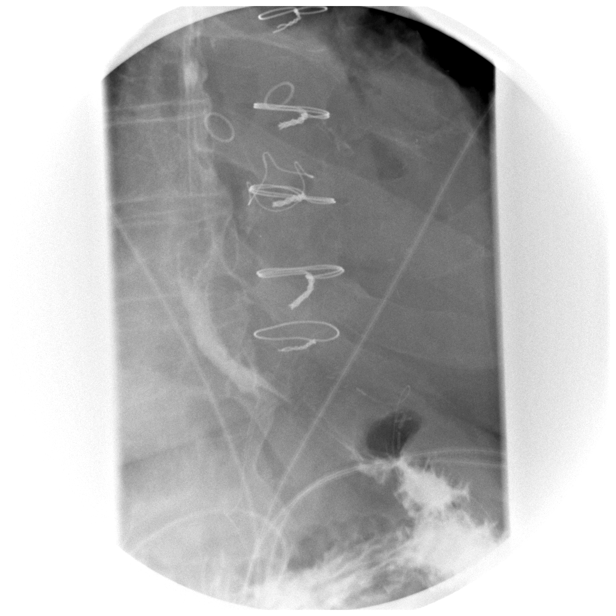

[Series 10: run · 1 of 11 slices shown (6 of 7)]
[im 1/11]
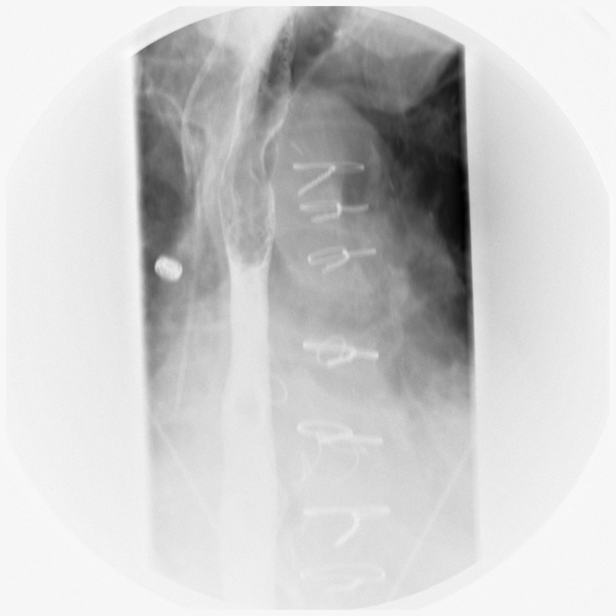

[Series 11: run · 1 of 2 slices shown (7 of 7)]
[im 1/2]
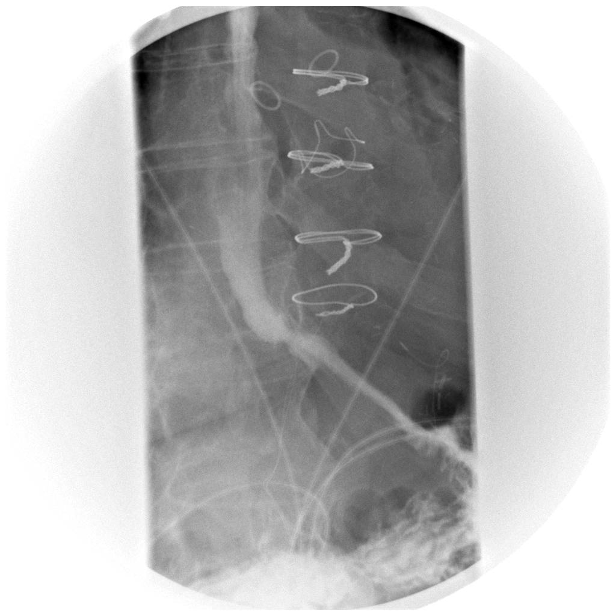

[14 of 24 positions shown; findings below may reference images not displayed]

FINDINGS: Aortic valve prosthesis noted.  Prior CABG noted.

The patient had several coughing episodes while swallowing water
soluble contrast and barium, likely attributable to laryngeal
penetration.  The swallowing function study was performed after
this exam.

We do not demonstrate a significant abnormal outpouching of
contrast or esophageal wall luminal irregularity with the use of
water soluble contrast for thin barium to suggest esophageal
tear/injury.
IMPRESSION: 1.  No esophageal tear is observed on today's water-soluble and
thin barium esophagram assessment.
2.  Coughing episodes while swallowing may be attributable to
laryngeal penetration or aspiration.  A swallowing function study
was performed following this exam.

## 2013-03-17 NOTE — Progress Notes (Signed)
Cardiac Rehabilitation Program Outcomes Report   Orientation:  03/11/2012 Graduate Date:  06/13/2012 Discharge Date:  06/13/2012 # of sessions completed: 33  Cardiologist: Nona Dell Family MD:  Dr. Carylon Perches Class Time:  09:30  A.  Exercise Program:  Tolerates exercise @ 4.03 METS for 15 minutes and Discharged  B.  Mental Health:  Good mental attitude  C.  Education/Instruction/Skills  Accurately checks own pulse.  Rest:  75  Exercise: 112, Knows THR for exercise, Uses Perceived Exertion Scale and/or Dyspnea Scale and Attended 10  education classes  Uses Perceived Exertion Scale and/or Dyspnea Scale  D.  Nutrition/Weight Control/Body Composition:  Adherence to prescribed nutrition program: good    E.  Blood Lipids    No results found for this basename: CHOL, HDL, LDLCALC, LDLDIRECT, TRIG, CHOLHDL    F.  Lifestyle Changes:  Making positive lifestyle changes  G.  Symptoms noted with exercise:  Asymptomatic  Report Completed By:  Lelon Huh. Bird Swetz RN   Comments:  *This is patients graduation report. He achieved a peak METS of 4.03. His resting HR was 75 and resting BP was 118/60. His peak HR was 112 and peak BP was 118/60. He did very well while in rehab classes. He attended only 33 visits never came back to finish rest.

## 2013-03-17 NOTE — Progress Notes (Signed)
Cardiac Rehabilitation Program Outcomes Report   Orientation:  03/11/2012 Halfway report: 05/05/2012 Graduate Date:  tbd Discharge Date:  tbd # of sessions completed: 18 DX; Aortic Stenosis, CAD  Cardiologist: Karalee Height Family MD:  Dr. Carylon Perches Class Time:  09:30  A.  Exercise Program:  Tolerates exercise @ 4.03 METS for 15 minutes  B.  Mental Health:  Good mental attitude  C.  Education/Instruction/Skills  Accurately checks own pulse.  Rest:  95  Exercise: 127, Knows THR for exercise and Uses Perceived Exertion Scale and/or Dyspnea Scale  Uses Perceived Exertion Scale and/or Dyspnea Scale  D.  Nutrition/Weight Control/Body Composition:  Adherence to prescribed nutrition program: good    E.  Blood Lipids    No results found for this basename: CHOL, HDL, LDLCALC, LDLDIRECT, TRIG, CHOLHDL    F.  Lifestyle Changes:  Making positive lifestyle changes  G.  Symptoms noted with exercise:  Asymptomatic  Report Completed By:  Lelon Huh. Seeley Southgate RN   Comments:  This is Patients halfway report. He achieved a Peak METS of 4.03. His resting HR was 95 and resting BP was 110/60, His peak HR was 127 and peak BP was 140/70. A graduation report will follow

## 2013-03-19 DIAGNOSIS — D509 Iron deficiency anemia, unspecified: Secondary | ICD-10-CM | POA: Diagnosis not present

## 2013-03-19 DIAGNOSIS — M109 Gout, unspecified: Secondary | ICD-10-CM | POA: Diagnosis not present

## 2013-04-03 DIAGNOSIS — D649 Anemia, unspecified: Secondary | ICD-10-CM | POA: Diagnosis not present

## 2013-04-03 DIAGNOSIS — M109 Gout, unspecified: Secondary | ICD-10-CM | POA: Diagnosis not present

## 2013-04-08 ENCOUNTER — Other Ambulatory Visit: Payer: Self-pay | Admitting: Cardiology

## 2013-04-20 ENCOUNTER — Ambulatory Visit (INDEPENDENT_AMBULATORY_CARE_PROVIDER_SITE_OTHER): Payer: Medicare Other | Admitting: *Deleted

## 2013-04-20 DIAGNOSIS — I4891 Unspecified atrial fibrillation: Secondary | ICD-10-CM

## 2013-04-20 DIAGNOSIS — Z7901 Long term (current) use of anticoagulants: Secondary | ICD-10-CM | POA: Diagnosis not present

## 2013-04-20 DIAGNOSIS — Z954 Presence of other heart-valve replacement: Secondary | ICD-10-CM

## 2013-04-20 DIAGNOSIS — Z952 Presence of prosthetic heart valve: Secondary | ICD-10-CM

## 2013-04-20 LAB — POCT INR: INR: 2.6

## 2013-05-08 ENCOUNTER — Other Ambulatory Visit: Payer: Self-pay | Admitting: Cardiology

## 2013-05-08 NOTE — Telephone Encounter (Signed)
Medication sent via escribe.  

## 2013-05-13 ENCOUNTER — Encounter: Payer: Self-pay | Admitting: *Deleted

## 2013-05-27 ENCOUNTER — Other Ambulatory Visit: Payer: Self-pay

## 2013-06-01 ENCOUNTER — Ambulatory Visit (INDEPENDENT_AMBULATORY_CARE_PROVIDER_SITE_OTHER): Payer: Medicare Other | Admitting: *Deleted

## 2013-06-01 DIAGNOSIS — Z954 Presence of other heart-valve replacement: Secondary | ICD-10-CM | POA: Diagnosis not present

## 2013-06-01 DIAGNOSIS — I4891 Unspecified atrial fibrillation: Secondary | ICD-10-CM

## 2013-06-01 DIAGNOSIS — Z7901 Long term (current) use of anticoagulants: Secondary | ICD-10-CM

## 2013-06-01 DIAGNOSIS — I442 Atrioventricular block, complete: Secondary | ICD-10-CM

## 2013-06-01 DIAGNOSIS — Z952 Presence of prosthetic heart valve: Secondary | ICD-10-CM

## 2013-06-01 LAB — PACEMAKER DEVICE OBSERVATION
AL AMPLITUDE: 1 mv
AL IMPEDENCE PM: 541 Ohm
ATRIAL PACING PM: 73
BATTERY VOLTAGE: 2.78 V
RV LEAD IMPEDENCE PM: 606 Ohm
VENTRICULAR PACING PM: 100

## 2013-06-01 NOTE — Progress Notes (Signed)
PPM check in office. 

## 2013-06-08 ENCOUNTER — Other Ambulatory Visit: Payer: Self-pay | Admitting: Cardiology

## 2013-06-08 DIAGNOSIS — M109 Gout, unspecified: Secondary | ICD-10-CM | POA: Diagnosis not present

## 2013-06-15 ENCOUNTER — Ambulatory Visit (INDEPENDENT_AMBULATORY_CARE_PROVIDER_SITE_OTHER): Payer: Medicare Other | Admitting: *Deleted

## 2013-06-15 DIAGNOSIS — Z952 Presence of prosthetic heart valve: Secondary | ICD-10-CM

## 2013-06-15 DIAGNOSIS — Z7901 Long term (current) use of anticoagulants: Secondary | ICD-10-CM | POA: Diagnosis not present

## 2013-06-15 DIAGNOSIS — I4891 Unspecified atrial fibrillation: Secondary | ICD-10-CM | POA: Diagnosis not present

## 2013-06-15 DIAGNOSIS — Z954 Presence of other heart-valve replacement: Secondary | ICD-10-CM | POA: Diagnosis not present

## 2013-06-15 LAB — POCT INR: INR: 4

## 2013-06-24 ENCOUNTER — Ambulatory Visit (INDEPENDENT_AMBULATORY_CARE_PROVIDER_SITE_OTHER): Payer: Medicare Other | Admitting: *Deleted

## 2013-06-24 DIAGNOSIS — I4891 Unspecified atrial fibrillation: Secondary | ICD-10-CM | POA: Diagnosis not present

## 2013-06-24 DIAGNOSIS — Z7901 Long term (current) use of anticoagulants: Secondary | ICD-10-CM

## 2013-06-24 DIAGNOSIS — Z954 Presence of other heart-valve replacement: Secondary | ICD-10-CM | POA: Diagnosis not present

## 2013-06-24 DIAGNOSIS — Z952 Presence of prosthetic heart valve: Secondary | ICD-10-CM

## 2013-06-29 DIAGNOSIS — D509 Iron deficiency anemia, unspecified: Secondary | ICD-10-CM | POA: Diagnosis not present

## 2013-07-06 ENCOUNTER — Encounter: Payer: Self-pay | Admitting: Internal Medicine

## 2013-07-06 DIAGNOSIS — D509 Iron deficiency anemia, unspecified: Secondary | ICD-10-CM | POA: Diagnosis not present

## 2013-07-06 DIAGNOSIS — M109 Gout, unspecified: Secondary | ICD-10-CM | POA: Diagnosis not present

## 2013-07-09 ENCOUNTER — Ambulatory Visit: Payer: Medicare Other | Admitting: Cardiology

## 2013-07-17 ENCOUNTER — Other Ambulatory Visit: Payer: Self-pay | Admitting: Dermatology

## 2013-07-17 DIAGNOSIS — L821 Other seborrheic keratosis: Secondary | ICD-10-CM | POA: Diagnosis not present

## 2013-07-17 DIAGNOSIS — Z85828 Personal history of other malignant neoplasm of skin: Secondary | ICD-10-CM | POA: Diagnosis not present

## 2013-07-17 DIAGNOSIS — L738 Other specified follicular disorders: Secondary | ICD-10-CM | POA: Diagnosis not present

## 2013-07-17 DIAGNOSIS — D485 Neoplasm of uncertain behavior of skin: Secondary | ICD-10-CM | POA: Diagnosis not present

## 2013-07-17 DIAGNOSIS — L570 Actinic keratosis: Secondary | ICD-10-CM | POA: Diagnosis not present

## 2013-07-23 ENCOUNTER — Ambulatory Visit (INDEPENDENT_AMBULATORY_CARE_PROVIDER_SITE_OTHER): Payer: Medicare Other | Admitting: *Deleted

## 2013-07-23 ENCOUNTER — Ambulatory Visit (INDEPENDENT_AMBULATORY_CARE_PROVIDER_SITE_OTHER): Payer: Medicare Other | Admitting: Cardiology

## 2013-07-23 ENCOUNTER — Encounter: Payer: Self-pay | Admitting: Cardiology

## 2013-07-23 VITALS — BP 121/75 | HR 70 | Ht 68.0 in | Wt 225.2 lb

## 2013-07-23 DIAGNOSIS — Z954 Presence of other heart-valve replacement: Secondary | ICD-10-CM

## 2013-07-23 DIAGNOSIS — Z952 Presence of prosthetic heart valve: Secondary | ICD-10-CM

## 2013-07-23 DIAGNOSIS — I10 Essential (primary) hypertension: Secondary | ICD-10-CM | POA: Diagnosis not present

## 2013-07-23 DIAGNOSIS — I4891 Unspecified atrial fibrillation: Secondary | ICD-10-CM | POA: Diagnosis not present

## 2013-07-23 DIAGNOSIS — Z7901 Long term (current) use of anticoagulants: Secondary | ICD-10-CM

## 2013-07-23 DIAGNOSIS — I2510 Atherosclerotic heart disease of native coronary artery without angina pectoris: Secondary | ICD-10-CM

## 2013-07-23 NOTE — Assessment & Plan Note (Signed)
Blood pressure control is good today. No changes made. 

## 2013-07-23 NOTE — Patient Instructions (Addendum)
Your physician recommends that you schedule a follow-up appointment in: 6 MONTHS   Your physician recommends that you continue on your current medications as directed. Please refer to the Current Medication list given to you today.  

## 2013-07-23 NOTE — Assessment & Plan Note (Signed)
Paroxysmal, continue current regimen including beta blocker and Coumadin.

## 2013-07-23 NOTE — Assessment & Plan Note (Signed)
Bioprosthetic AVR with associated Bentall procedure for ascending aneurysm. He is doing well.

## 2013-07-23 NOTE — Assessment & Plan Note (Signed)
Symptomatically stable on medical therapy following CABG.

## 2013-07-23 NOTE — Progress Notes (Signed)
Clinical Summary Douglas [name] is an 79 y.o.male last seen in March. He is doing very well, reports no functional limitations, states that he is "doing what I want to do."  He has had no bleeding problems with Coumadin.  Device check from August noted.  ECG today shows dual-chamber pacing. Weight is down 5 pounds from last visit. He denies any significant orthopnea or PND. Has had some flareups of gout, although this is now resolved.  No Known Allergies  Current Outpatient Prescriptions  Medication Sig Dispense Refill  . allopurinol (ZYLOPRIM) 100 MG tablet       . COLCRYS 0.6 MG tablet Take 0.6 mg by mouth as needed.       . furosemide (LASIX) 40 MG tablet TAKE 1 AND 1/2 TABLET DAILY.  45 tablet  3  . iron polysaccharides (NIFEREX) 150 MG capsule Take 150 mg by mouth daily.       . metoprolol succinate (TOPROL-XL) 25 MG 24 hr tablet TAKE (2) TABLETS BY MOUTH DAILY.  60 tablet  6  . pantoprazole (PROTONIX) 40 MG tablet TAKE ONE TABLET BY MOUTH ONCE DAILY IN THE MORNING.  30 tablet  5  . warfarin (COUMADIN) 5 MG tablet Take 0.5-1 tablets (2.5-5 mg total) by mouth daily. Takes a whole tablet everyday besides on Wednesday when he takes a half of a tablet.  30 tablet  3   No current facility-administered medications for this visit.    Past Medical History  Diagnosis Date  . Essential hypertension, benign   . DJD (degenerative joint disease)   . PMR (polymyalgia rheumatica)   . Gout   . Arthritis   . Aortic stenosis     Bioprosthetic AVR  . Hx of adenomatous colonic polyps   . Aneurysm of thoracic aorta     Ascending. 5.7 x 5.5cm - Bentall procedure  . Coronary atherosclerosis of native coronary artery     Multivessel - LIMA to LAD, SVG to OM, SVG to PDA  . H/O hiatal hernia   . Esophageal dysmotility   . Fibromyalgia   . Complete heart block     Status post pacemaker placement  . Atrial fibrillation     Documented on pacer interrogation 6/13    Past Surgical History    Procedure Laterality Date  . Bilateral knee arthroscopy    . Appendectomy    . Hemorroidectomy    . Left inguinal hernia repair    . Hernia repair    . Back surgery      1965 or 1966 lumbar back surgery  . Bentall procedure  01/24/2012    Procedure: BENTALL PROCEDURE;  Surgeon: Kerin Perna, MD;  Location: [facility] OR;  Service: Open Heart Surgery;  Laterality: N/A;  . Coronary artery bypass graft  01/24/2012    Procedure: CORONARY ARTERY BYPASS GRAFTING (CABG);  Surgeon: Kerin Perna, MD;  Location: [facility] OR;  Service: Open Heart Surgery;  Laterality: N/A;  . Pacemaker placement      Medtronic 4/13 - Dr. Johney Frame  . Aortic valve replacement  01/24/12  . Esophagogastroduodenoscopy  06/17/2012    Procedure: ESOPHAGOGASTRODUODENOSCOPY (EGD);  Surgeon: Malissa Hippo, MD;  [facility];  Service: Endoscopy;  Laterality: N/A;  ED    Social History Douglas [name] reports that he has never smoked. He has never used smokeless tobacco. Douglas [name] reports that he does not drink alcohol.  Review of Systems Negative except as outlined above.  Physical Examination Filed Vitals:  07/23/13 0913  BP: 121/75  Pulse: 70   Filed Weights   07/23/13 0913  Weight: 225 lb 4 oz (102.173 kg)    No acute distress.  HEENT: Conjunctiva and lids normal, oropharynx clear.  Neck: Supple, increased girth, no carotid bruits, no thyromegaly.  Thorax: Stable pacer pocket site. Well healing midline sternal incision.  Lungs: Decreased breath sounds, nonlabored breathing at rest.  Cardiac: RRR, no S3, soft systolic murmur, no pericardial rub.  Abdomen: Soft, nontender, bowel sounds present, no guarding or rebound.  Extremities: 1+ edema.    Problem List and Plan   Atrial fibrillation Paroxysmal, continue current regimen including beta blocker and Coumadin.  Essential hypertension, benign Blood pressure control is good today. No changes made.  Coronary atherosclerosis of native coronary  artery Symptomatically stable on medical therapy following CABG.  Aortic valve replaced Bioprosthetic AVR with associated Bentall procedure for ascending aneurysm. He is doing well.    Jonelle Sidle, M.D., F.A.C.C.

## 2013-08-18 DIAGNOSIS — Z23 Encounter for immunization: Secondary | ICD-10-CM | POA: Diagnosis not present

## 2013-08-27 ENCOUNTER — Ambulatory Visit (INDEPENDENT_AMBULATORY_CARE_PROVIDER_SITE_OTHER): Payer: Medicare Other | Admitting: *Deleted

## 2013-08-27 ENCOUNTER — Other Ambulatory Visit: Payer: Self-pay

## 2013-08-27 DIAGNOSIS — Z7901 Long term (current) use of anticoagulants: Secondary | ICD-10-CM

## 2013-08-27 DIAGNOSIS — Z952 Presence of prosthetic heart valve: Secondary | ICD-10-CM

## 2013-08-27 DIAGNOSIS — Z954 Presence of other heart-valve replacement: Secondary | ICD-10-CM | POA: Diagnosis not present

## 2013-08-27 DIAGNOSIS — I4891 Unspecified atrial fibrillation: Secondary | ICD-10-CM

## 2013-08-27 LAB — POCT INR: INR: 2.5

## 2013-09-30 ENCOUNTER — Other Ambulatory Visit: Payer: Self-pay | Admitting: Cardiology

## 2013-10-01 ENCOUNTER — Ambulatory Visit (INDEPENDENT_AMBULATORY_CARE_PROVIDER_SITE_OTHER): Payer: Medicare Other | Admitting: *Deleted

## 2013-10-01 DIAGNOSIS — Z952 Presence of prosthetic heart valve: Secondary | ICD-10-CM

## 2013-10-01 DIAGNOSIS — Z7901 Long term (current) use of anticoagulants: Secondary | ICD-10-CM

## 2013-10-01 DIAGNOSIS — I4891 Unspecified atrial fibrillation: Secondary | ICD-10-CM

## 2013-10-01 DIAGNOSIS — Z954 Presence of other heart-valve replacement: Secondary | ICD-10-CM | POA: Diagnosis not present

## 2013-10-01 LAB — POCT INR: INR: 2.8

## 2013-10-02 DIAGNOSIS — M109 Gout, unspecified: Secondary | ICD-10-CM | POA: Diagnosis not present

## 2013-10-02 DIAGNOSIS — Z79899 Other long term (current) drug therapy: Secondary | ICD-10-CM | POA: Diagnosis not present

## 2013-10-02 DIAGNOSIS — D509 Iron deficiency anemia, unspecified: Secondary | ICD-10-CM | POA: Diagnosis not present

## 2013-10-06 ENCOUNTER — Other Ambulatory Visit: Payer: Self-pay | Admitting: Cardiology

## 2013-10-09 DIAGNOSIS — M109 Gout, unspecified: Secondary | ICD-10-CM | POA: Diagnosis not present

## 2013-10-09 DIAGNOSIS — D649 Anemia, unspecified: Secondary | ICD-10-CM | POA: Diagnosis not present

## 2013-11-12 ENCOUNTER — Ambulatory Visit (INDEPENDENT_AMBULATORY_CARE_PROVIDER_SITE_OTHER): Payer: Medicare Other | Admitting: *Deleted

## 2013-11-12 DIAGNOSIS — Z954 Presence of other heart-valve replacement: Secondary | ICD-10-CM

## 2013-11-12 DIAGNOSIS — Z7901 Long term (current) use of anticoagulants: Secondary | ICD-10-CM | POA: Diagnosis not present

## 2013-11-12 DIAGNOSIS — I4891 Unspecified atrial fibrillation: Secondary | ICD-10-CM | POA: Diagnosis not present

## 2013-11-12 DIAGNOSIS — Z952 Presence of prosthetic heart valve: Secondary | ICD-10-CM

## 2013-11-12 LAB — POCT INR: INR: 2.3

## 2013-12-09 ENCOUNTER — Encounter: Payer: Self-pay | Admitting: *Deleted

## 2013-12-21 DIAGNOSIS — D509 Iron deficiency anemia, unspecified: Secondary | ICD-10-CM | POA: Diagnosis not present

## 2013-12-21 DIAGNOSIS — Z79899 Other long term (current) drug therapy: Secondary | ICD-10-CM | POA: Diagnosis not present

## 2013-12-21 DIAGNOSIS — N390 Urinary tract infection, site not specified: Secondary | ICD-10-CM | POA: Diagnosis not present

## 2013-12-21 DIAGNOSIS — M109 Gout, unspecified: Secondary | ICD-10-CM | POA: Diagnosis not present

## 2013-12-21 DIAGNOSIS — I10 Essential (primary) hypertension: Secondary | ICD-10-CM | POA: Diagnosis not present

## 2013-12-21 DIAGNOSIS — I4891 Unspecified atrial fibrillation: Secondary | ICD-10-CM | POA: Diagnosis not present

## 2013-12-21 DIAGNOSIS — I2510 Atherosclerotic heart disease of native coronary artery without angina pectoris: Secondary | ICD-10-CM | POA: Diagnosis not present

## 2013-12-24 ENCOUNTER — Ambulatory Visit (INDEPENDENT_AMBULATORY_CARE_PROVIDER_SITE_OTHER): Payer: Medicare Other | Admitting: *Deleted

## 2013-12-24 DIAGNOSIS — Z952 Presence of prosthetic heart valve: Secondary | ICD-10-CM

## 2013-12-24 DIAGNOSIS — Z954 Presence of other heart-valve replacement: Secondary | ICD-10-CM | POA: Diagnosis not present

## 2013-12-24 DIAGNOSIS — Z7901 Long term (current) use of anticoagulants: Secondary | ICD-10-CM | POA: Diagnosis not present

## 2013-12-24 DIAGNOSIS — I4891 Unspecified atrial fibrillation: Secondary | ICD-10-CM

## 2013-12-24 LAB — POCT INR: INR: 3.3

## 2013-12-31 DIAGNOSIS — Z0000 Encounter for general adult medical examination without abnormal findings: Secondary | ICD-10-CM | POA: Diagnosis not present

## 2013-12-31 DIAGNOSIS — Z1212 Encounter for screening for malignant neoplasm of rectum: Secondary | ICD-10-CM | POA: Diagnosis not present

## 2013-12-31 DIAGNOSIS — I4891 Unspecified atrial fibrillation: Secondary | ICD-10-CM | POA: Diagnosis not present

## 2013-12-31 DIAGNOSIS — Z23 Encounter for immunization: Secondary | ICD-10-CM | POA: Diagnosis not present

## 2013-12-31 DIAGNOSIS — M109 Gout, unspecified: Secondary | ICD-10-CM | POA: Diagnosis not present

## 2014-01-05 ENCOUNTER — Other Ambulatory Visit: Payer: Self-pay | Admitting: Cardiology

## 2014-01-18 ENCOUNTER — Ambulatory Visit (INDEPENDENT_AMBULATORY_CARE_PROVIDER_SITE_OTHER): Payer: Medicare Other | Admitting: Internal Medicine

## 2014-01-18 ENCOUNTER — Encounter: Payer: Self-pay | Admitting: Internal Medicine

## 2014-01-18 ENCOUNTER — Ambulatory Visit (INDEPENDENT_AMBULATORY_CARE_PROVIDER_SITE_OTHER): Payer: Medicare Other | Admitting: *Deleted

## 2014-01-18 VITALS — BP 122/57 | HR 74 | Ht 67.0 in | Wt 232.0 lb

## 2014-01-18 DIAGNOSIS — Z954 Presence of other heart-valve replacement: Secondary | ICD-10-CM | POA: Diagnosis not present

## 2014-01-18 DIAGNOSIS — Z950 Presence of cardiac pacemaker: Secondary | ICD-10-CM | POA: Diagnosis not present

## 2014-01-18 DIAGNOSIS — I442 Atrioventricular block, complete: Secondary | ICD-10-CM

## 2014-01-18 DIAGNOSIS — Z7901 Long term (current) use of anticoagulants: Secondary | ICD-10-CM

## 2014-01-18 DIAGNOSIS — I4891 Unspecified atrial fibrillation: Secondary | ICD-10-CM

## 2014-01-18 DIAGNOSIS — Z952 Presence of prosthetic heart valve: Secondary | ICD-10-CM

## 2014-01-18 LAB — MDC_IDC_ENUM_SESS_TYPE_INCLINIC
Battery Impedance: 159 Ohm
Battery Remaining Longevity: 125 mo
Battery Voltage: 2.78 V
Brady Statistic AS VP Percent: 28 %
Lead Channel Pacing Threshold Amplitude: 0.5 V
Lead Channel Sensing Intrinsic Amplitude: 0.7 mV
Lead Channel Setting Pacing Amplitude: 2 V
Lead Channel Setting Pacing Amplitude: 2 V
Lead Channel Setting Pacing Pulse Width: 0.4 ms
MDC IDC MSMT LEADCHNL RA IMPEDANCE VALUE: 523 Ohm
MDC IDC MSMT LEADCHNL RA PACING THRESHOLD AMPLITUDE: 0.5 V
MDC IDC MSMT LEADCHNL RA PACING THRESHOLD PULSEWIDTH: 0.4 ms
MDC IDC MSMT LEADCHNL RV IMPEDANCE VALUE: 590 Ohm
MDC IDC MSMT LEADCHNL RV PACING THRESHOLD PULSEWIDTH: 0.4 ms
MDC IDC SESS DTM: 20150330095835
MDC IDC SET LEADCHNL RV SENSING SENSITIVITY: 4 mV
MDC IDC STAT BRADY AP VP PERCENT: 72 %
MDC IDC STAT BRADY AP VS PERCENT: 0 %
MDC IDC STAT BRADY AS VS PERCENT: 0 %

## 2014-01-18 LAB — POCT INR: INR: 2.9

## 2014-01-18 NOTE — Assessment & Plan Note (Signed)
His Medtronic DDD PM is working normally. Will recheck in several months. He has over 10 years of battery longevity.

## 2014-01-18 NOTE — Progress Notes (Signed)
HPI [name] returns today for followup. He is a pleasant 80 yo man with a h/o CHB, after AVR/Bentall with a tissue valve, s/p PPM, HTN and chronic coumadin therapy. In the interim, he has done well. He denies chest pain or sob. No syncope. No peripheral edema. He wants to run a chain saw.  No Known Allergies   Current Outpatient Prescriptions  Medication Sig Dispense Refill  . allopurinol (ZYLOPRIM) 300 MG tablet Take 300 mg by mouth daily. Take 1 1/2 daily      . furosemide (LASIX) 40 MG tablet TAKE 1 AND 1/2 TABLET DAILY.  45 tablet  3  . metoprolol succinate (TOPROL-XL) 25 MG 24 hr tablet TAKE (2) TABLETS BY MOUTH DAILY.  60 tablet  6  . pantoprazole (PROTONIX) 40 MG tablet TAKE ONE TABLET BY MOUTH ONCE DAILY IN THE MORNING.  60 tablet  3  . warfarin (COUMADIN) 5 MG tablet Take 1 tablet daily except 1/2 tablet on Wednesdays  90 tablet  3   No current facility-administered medications for this visit.     Past Medical History  Diagnosis Date  . Essential hypertension, benign   . DJD (degenerative joint disease)   . PMR (polymyalgia rheumatica)   . Gout   . Arthritis   . Aortic stenosis     Bioprosthetic AVR  . Hx of adenomatous colonic polyps   . Aneurysm of thoracic aorta     Ascending. 5.7 x 5.5cm - Bentall procedure  . Coronary atherosclerosis of native coronary artery     Multivessel - LIMA to LAD, SVG to OM, SVG to PDA  . H/O hiatal hernia   . Esophageal dysmotility   . Fibromyalgia   . Complete heart block     Status post pacemaker placement  . Atrial fibrillation     Documented on pacer interrogation 6/13    ROS:   All systems reviewed and negative except as noted in the HPI.   Past Surgical History  Procedure Laterality Date  . Bilateral knee arthroscopy    . Appendectomy    . Hemorroidectomy    . Left inguinal hernia repair    . Hernia repair    . Back surgery      1965 or 1966 lumbar back surgery  . Bentall procedure  01/24/2012   Procedure: BENTALL PROCEDURE;  Surgeon: Ivin Poot, MD;  Location: [place];  Service: Open Heart Surgery;  Laterality: N/A;  . Coronary artery bypass graft  01/24/2012    Procedure: CORONARY ARTERY BYPASS GRAFTING (CABG);  Surgeon: Ivin Poot, MD;  Location: [place];  Service: Open Heart Surgery;  Laterality: N/A;  . Pacemaker placement      Medtronic 4/13 - Dr. Rayann Heman  . Aortic valve replacement  01/24/12  . Esophagogastroduodenoscopy  06/17/2012    Procedure: ESOPHAGOGASTRODUODENOSCOPY (EGD);  Surgeon: Rogene Houston, MD;  [facility];  Service: Endoscopy;  Laterality: N/A;  ED     Family History  Problem Relation Age of Onset  . Kidney disease Mother   . Heart disease Father   . Anesthesia problems Neg Hx   . Hypotension Neg Hx   . Malignant hyperthermia Neg Hx   . Pseudochol deficiency Neg Hx      History   Social History  . Marital Status: Married    Spouse Name: N/A    Number of Children: 4  . Years of Education: N/A   Occupational History  . Retired  Dairy farmer   Social History Main Topics  . Smoking status: Never Smoker   . Smokeless tobacco: Never Used  . Alcohol Use: No  . Drug Use: No  . Sexual Activity: No   Other Topics Concern  . Not on file   Social History Narrative  . No narrative on file     BP 122/57  Pulse 74  Ht 5\' 7"  (1.702 m)  Wt 232 lb (105.235 kg)  BMI 36.33 kg/m2  Physical Exam:  Well appearing NAD HEENT: Unremarkable Neck:  No JVD, no thyromegally Lymphatics:  No adenopathy Back:  No CVA tenderness Lungs:  Clear with no wheezes.  HEART:  Regular rate rhythm, no murmurs, no rubs, no clicks, with no murmur. Abd:  soft, positive bowel sounds, no organomegally, no rebound, no guarding Ext:  2 plus pulses, no edema, no cyanosis, no clubbing Skin:  No rashes no nodules Neuro:  CN II through XII intact, motor grossly intact   DEVICE  Normal device function.  See PaceArt for details.   Assess/Plan:

## 2014-01-18 NOTE — Patient Instructions (Signed)
Your physician recommends that you schedule a follow-up appointment in: 6 months with Device clinic and 12 months with Dr Taylor You will receive a reminder letter two months in advance reminding you to call and schedule your appointment. If you don't receive this letter, please contact our office.     

## 2014-01-18 NOTE — Assessment & Plan Note (Signed)
He appears to be maintaining NSR very nicely. He will continue his current meds and his anticoagulation.

## 2014-02-04 ENCOUNTER — Other Ambulatory Visit (HOSPITAL_COMMUNITY): Payer: Self-pay | Admitting: Internal Medicine

## 2014-02-04 ENCOUNTER — Ambulatory Visit (HOSPITAL_COMMUNITY)
Admission: RE | Admit: 2014-02-04 | Discharge: 2014-02-04 | Disposition: A | Discharge status: Home / Self Care | Payer: Medicare Other | Source: Ambulatory Visit | Attending: Internal Medicine | Admitting: Internal Medicine

## 2014-02-04 DIAGNOSIS — M1710 Unilateral primary osteoarthritis, unspecified knee: Secondary | ICD-10-CM | POA: Diagnosis not present

## 2014-02-04 DIAGNOSIS — IMO0002 Reserved for concepts with insufficient information to code with codable children: Secondary | ICD-10-CM | POA: Diagnosis not present

## 2014-02-04 DIAGNOSIS — M898X9 Other specified disorders of bone, unspecified site: Secondary | ICD-10-CM | POA: Diagnosis not present

## 2014-02-04 DIAGNOSIS — M25569 Pain in unspecified knee: Secondary | ICD-10-CM | POA: Insufficient documentation

## 2014-02-04 DIAGNOSIS — M1120 Other chondrocalcinosis, unspecified site: Secondary | ICD-10-CM | POA: Diagnosis not present

## 2014-02-04 DIAGNOSIS — M25562 Pain in left knee: Secondary | ICD-10-CM

## 2014-02-05 ENCOUNTER — Other Ambulatory Visit: Payer: Self-pay | Admitting: Cardiology

## 2014-02-15 ENCOUNTER — Ambulatory Visit (INDEPENDENT_AMBULATORY_CARE_PROVIDER_SITE_OTHER): Payer: Medicare Other | Admitting: *Deleted

## 2014-02-15 DIAGNOSIS — Z954 Presence of other heart-valve replacement: Secondary | ICD-10-CM | POA: Diagnosis not present

## 2014-02-15 DIAGNOSIS — Z952 Presence of prosthetic heart valve: Secondary | ICD-10-CM

## 2014-02-15 DIAGNOSIS — Z7901 Long term (current) use of anticoagulants: Secondary | ICD-10-CM | POA: Diagnosis not present

## 2014-02-15 DIAGNOSIS — I4891 Unspecified atrial fibrillation: Secondary | ICD-10-CM | POA: Diagnosis not present

## 2014-02-15 LAB — POCT INR: INR: 3.5

## 2014-02-22 DIAGNOSIS — H52229 Regular astigmatism, unspecified eye: Secondary | ICD-10-CM | POA: Diagnosis not present

## 2014-02-22 DIAGNOSIS — H2510 Age-related nuclear cataract, unspecified eye: Secondary | ICD-10-CM | POA: Diagnosis not present

## 2014-02-22 DIAGNOSIS — H5200 Hypermetropia, unspecified eye: Secondary | ICD-10-CM | POA: Diagnosis not present

## 2014-02-22 DIAGNOSIS — H43819 Vitreous degeneration, unspecified eye: Secondary | ICD-10-CM | POA: Diagnosis not present

## 2014-03-17 ENCOUNTER — Encounter: Payer: Self-pay | Admitting: Cardiology

## 2014-03-17 ENCOUNTER — Ambulatory Visit (INDEPENDENT_AMBULATORY_CARE_PROVIDER_SITE_OTHER): Payer: Medicare Other | Admitting: *Deleted

## 2014-03-17 ENCOUNTER — Ambulatory Visit (INDEPENDENT_AMBULATORY_CARE_PROVIDER_SITE_OTHER): Payer: Medicare Other | Admitting: Cardiology

## 2014-03-17 VITALS — BP 122/74 | HR 71 | Ht 68.0 in | Wt 234.0 lb

## 2014-03-17 DIAGNOSIS — I2510 Atherosclerotic heart disease of native coronary artery without angina pectoris: Secondary | ICD-10-CM | POA: Diagnosis not present

## 2014-03-17 DIAGNOSIS — I10 Essential (primary) hypertension: Secondary | ICD-10-CM

## 2014-03-17 DIAGNOSIS — Z954 Presence of other heart-valve replacement: Secondary | ICD-10-CM

## 2014-03-17 DIAGNOSIS — Z7901 Long term (current) use of anticoagulants: Secondary | ICD-10-CM

## 2014-03-17 DIAGNOSIS — I4891 Unspecified atrial fibrillation: Secondary | ICD-10-CM

## 2014-03-17 DIAGNOSIS — Z952 Presence of prosthetic heart valve: Secondary | ICD-10-CM

## 2014-03-17 LAB — POCT INR: INR: 2.6

## 2014-03-17 NOTE — Patient Instructions (Signed)
Your physician wants you to follow-up in: 6 months You will receive a reminder letter in the mail two months in advance. If you don't receive a letter, please call our office to schedule the follow-up appointment.     Your physician recommends that you continue on your current medications as directed. Please refer to the Current Medication list given to you today.      Thank you for choosing [facility] !        

## 2014-03-17 NOTE — Assessment & Plan Note (Signed)
Blood pressure is well-controlled today. 

## 2014-03-17 NOTE — Assessment & Plan Note (Signed)
Symptomatically stable on medical therapy following CABG. No change to current regimen.

## 2014-03-17 NOTE — Progress Notes (Signed)
Clinical Summary Mr. Cease is an 80 y.o.male last seen in October 2014. Interval visit with Dr. Lovena Le noted in March - normal device function at that time. He has been relatively stable from a cardiac perspective, no interval hospitalizations. He reports compliance with his medications, no bleeding problems with Coumadin. Continues to do chores around the house and works in his garden. His main limitation is bilateral arthritic knee pain.  Echocardiogram from April 2013 indicated mild LVH with LVEF 60-65%, no comment on diastolic function, aortic prosthesis with mean gradient 18 mm mercury, mildly thickened mitral leaflets with trivial mitral regurgitation, mild left atrial enlargement, mildly dilated RV, device wire noted in the right heart, small pericardial effusion.   No Known Allergies  Current Outpatient Prescriptions  Medication Sig Dispense Refill  . allopurinol (ZYLOPRIM) 300 MG tablet Take 300 mg by mouth daily. Take 1 1/2 daily      . Diclofenac Sodium POWD as needed.      . furosemide (LASIX) 40 MG tablet TAKE 1 AND 1/2 TABLET DAILY.  45 tablet  3  . metoprolol succinate (TOPROL-XL) 25 MG 24 hr tablet TAKE (2) TABLETS BY MOUTH DAILY.  60 tablet  3  . pantoprazole (PROTONIX) 40 MG tablet TAKE ONE TABLET BY MOUTH ONCE DAILY IN THE MORNING.  30 tablet  3  . warfarin (COUMADIN) 5 MG tablet Take 1 tablet daily except 1/2 tablet on Mondays and Thursdays       No current facility-administered medications for this visit.    Past Medical History  Diagnosis Date  . Essential hypertension, benign   . DJD (degenerative joint disease)   . PMR (polymyalgia rheumatica)   . Gout   . Arthritis   . Aortic stenosis     Bioprosthetic AVR  . Hx of adenomatous colonic polyps   . Aneurysm of thoracic aorta     Ascending. 5.7 x 5.5cm - Bentall procedure  . Coronary atherosclerosis of native coronary artery     Multivessel - LIMA to LAD, SVG to OM, SVG to PDA  . H/O hiatal hernia   .  Esophageal dysmotility   . Fibromyalgia   . Complete heart block     Status post pacemaker placement  . Atrial fibrillation     Documented on pacer interrogation 6/13    Past Surgical History  Procedure Laterality Date  . Bilateral knee arthroscopy    . Appendectomy    . Hemorroidectomy    . Left inguinal hernia repair    . Hernia repair    . Back surgery      1965 or 1966 lumbar back surgery  . Bentall procedure  01/24/2012    Procedure: BENTALL PROCEDURE;  Surgeon: Ivin Poot, MD;  Location: [place];  Service: Open Heart Surgery;  Laterality: N/A;  . Coronary artery bypass graft  01/24/2012    Procedure: CORONARY ARTERY BYPASS GRAFTING (CABG);  Surgeon: Ivin Poot, MD;  Location: [place];  Service: Open Heart Surgery;  Laterality: N/A;  . Pacemaker placement      Medtronic 4/13 - Dr. Rayann Heman  . Aortic valve replacement  01/24/12  . Esophagogastroduodenoscopy  06/17/2012    Procedure: ESOPHAGOGASTRODUODENOSCOPY (EGD);  Surgeon: Rogene Houston, MD;  [facility];  Service: Endoscopy;  Laterality: N/A;  ED    Social History Mr. Vanwieren reports that he has never smoked. He has never used smokeless tobacco. Mr. Stradling reports that he does not drink alcohol.  Review of Systems  No palpitations, dizziness, syncope. Otherwise as outlined.  Physical Examination Filed Vitals:   03/17/14 0920  BP: 122/74  Pulse: 71   Filed Weights   03/17/14 0920  Weight: 234 lb (106.142 kg)    No acute distress.  HEENT: Conjunctiva and lids normal, oropharynx clear.  Neck: Supple, increased girth, no carotid bruits, no thyromegaly.  Thorax: Stable pacer pocket site. Well healing midline sternal incision.  Lungs: Decreased breath sounds, nonlabored breathing at rest.  Cardiac: RRR, no S3, soft systolic murmur, no pericardial rub.  Abdomen: Soft, nontender, bowel sounds present, no guarding or rebound.  Extremities: 1+ edema.    Problem List and Plan   Coronary atherosclerosis  of native coronary artery Symptomatically stable on medical therapy following CABG. No change to current regimen.  Aortic valve replaced Bioprosthetic AVR with Bentall procedure. Stable on examination.  Atrial fibrillation Continues on Coumadin.  Essential hypertension, benign Blood pressure is well-controlled today.    Satira Sark, M.D., F.A.C.C.

## 2014-03-17 NOTE — Assessment & Plan Note (Signed)
Bioprosthetic AVR with Bentall procedure. Stable on examination. 

## 2014-03-17 NOTE — Assessment & Plan Note (Signed)
Continues on Coumadin.

## 2014-03-31 DIAGNOSIS — Z79899 Other long term (current) drug therapy: Secondary | ICD-10-CM | POA: Diagnosis not present

## 2014-03-31 DIAGNOSIS — M109 Gout, unspecified: Secondary | ICD-10-CM | POA: Diagnosis not present

## 2014-04-09 DIAGNOSIS — M259 Joint disorder, unspecified: Secondary | ICD-10-CM | POA: Diagnosis not present

## 2014-04-09 DIAGNOSIS — M109 Gout, unspecified: Secondary | ICD-10-CM | POA: Diagnosis not present

## 2014-04-13 DIAGNOSIS — M25569 Pain in unspecified knee: Secondary | ICD-10-CM | POA: Diagnosis not present

## 2014-04-13 DIAGNOSIS — M1710 Unilateral primary osteoarthritis, unspecified knee: Secondary | ICD-10-CM | POA: Diagnosis not present

## 2014-04-20 ENCOUNTER — Encounter: Payer: Self-pay | Admitting: Cardiology

## 2014-04-21 ENCOUNTER — Ambulatory Visit (INDEPENDENT_AMBULATORY_CARE_PROVIDER_SITE_OTHER): Payer: Medicare Other | Admitting: *Deleted

## 2014-04-21 DIAGNOSIS — Z954 Presence of other heart-valve replacement: Secondary | ICD-10-CM

## 2014-04-21 DIAGNOSIS — Z7901 Long term (current) use of anticoagulants: Secondary | ICD-10-CM | POA: Diagnosis not present

## 2014-04-21 DIAGNOSIS — Z952 Presence of prosthetic heart valve: Secondary | ICD-10-CM

## 2014-04-21 DIAGNOSIS — I4891 Unspecified atrial fibrillation: Secondary | ICD-10-CM

## 2014-04-21 LAB — POCT INR: INR: 2.9

## 2014-05-26 ENCOUNTER — Ambulatory Visit (INDEPENDENT_AMBULATORY_CARE_PROVIDER_SITE_OTHER): Payer: Medicare Other | Admitting: *Deleted

## 2014-05-26 DIAGNOSIS — Z954 Presence of other heart-valve replacement: Secondary | ICD-10-CM | POA: Diagnosis not present

## 2014-05-26 DIAGNOSIS — Z952 Presence of prosthetic heart valve: Secondary | ICD-10-CM

## 2014-05-26 DIAGNOSIS — Z7901 Long term (current) use of anticoagulants: Secondary | ICD-10-CM

## 2014-05-26 DIAGNOSIS — I4891 Unspecified atrial fibrillation: Secondary | ICD-10-CM

## 2014-05-26 LAB — POCT INR: INR: 3.3

## 2014-05-31 ENCOUNTER — Other Ambulatory Visit: Payer: Self-pay | Admitting: Cardiology

## 2014-06-14 ENCOUNTER — Telehealth: Payer: Self-pay | Admitting: Cardiology

## 2014-06-14 NOTE — Telephone Encounter (Signed)
Notified pt that we had an appt with Douglas [name] pt said he would call if he couldn't come he has to go to [facility]. No chest pain and advised if chest pain came back to go to ED and to make an appt with primary doctor

## 2014-06-14 NOTE — Telephone Encounter (Signed)
Patient states that he is having tightness in his chest for couple days / tgs

## 2014-06-15 ENCOUNTER — Ambulatory Visit (INDEPENDENT_AMBULATORY_CARE_PROVIDER_SITE_OTHER): Payer: Medicare Other | Admitting: Cardiology

## 2014-06-15 ENCOUNTER — Encounter: Payer: Self-pay | Admitting: Cardiology

## 2014-06-15 VITALS — BP 109/67 | HR 78 | Ht 68.0 in | Wt 222.0 lb

## 2014-06-15 DIAGNOSIS — R079 Chest pain, unspecified: Secondary | ICD-10-CM

## 2014-06-15 DIAGNOSIS — I359 Nonrheumatic aortic valve disorder, unspecified: Secondary | ICD-10-CM | POA: Diagnosis not present

## 2014-06-15 DIAGNOSIS — I712 Thoracic aortic aneurysm, without rupture, unspecified: Secondary | ICD-10-CM | POA: Diagnosis not present

## 2014-06-15 DIAGNOSIS — Z950 Presence of cardiac pacemaker: Secondary | ICD-10-CM

## 2014-06-15 DIAGNOSIS — R072 Precordial pain: Secondary | ICD-10-CM | POA: Diagnosis not present

## 2014-06-15 DIAGNOSIS — I350 Nonrheumatic aortic (valve) stenosis: Secondary | ICD-10-CM

## 2014-06-15 DIAGNOSIS — I2510 Atherosclerotic heart disease of native coronary artery without angina pectoris: Secondary | ICD-10-CM | POA: Diagnosis not present

## 2014-06-15 NOTE — Progress Notes (Signed)
Clinical Summary [name] is a medically complex 80 y.o.male last seen in May. He schedule this followup visit earlier than anticipated related to recent chest pain symptoms. He states that he has felt a tightness in his upper chest, also upper back, began a few weeks ago has been intermittent, initially noted after having caffeinated coffee which is not typical for him. He has not had any palpitations. She seems to feel something in his throat, a bitter taste, also states he has trouble swallowing pills sometimes. Symptoms are quite prolonged, not necessarily worsened with exertion. He reports compliance with medications.  Echocardiogram from April 2013 indicated mild LVH with LVEF 60-65%, no comment on diastolic function, aortic prosthesis with mean gradient 18 mm mercury, mildly thickened mitral leaflets with trivial mitral regurgitation, mild left atrial enlargement, mildly dilated RV, device wire noted in the right heart, small pericardial effusion.  ECG today shows dual-chamber pacing. He is not in any distress.  No Known Allergies  Current Outpatient Prescriptions  Medication Sig Dispense Refill  . allopurinol (ZYLOPRIM) 300 MG tablet Take 300 mg by mouth daily. Take 1 1/2 daily      . Diclofenac Sodium POWD as needed.      . furosemide (LASIX) 40 MG tablet TAKE 1 AND 1/2 TABLET DAILY.  45 tablet  3  . metoprolol succinate (TOPROL-XL) 25 MG 24 hr tablet TAKE (2) TABLETS BY MOUTH DAILY.  60 tablet  3  . pantoprazole (PROTONIX) 40 MG tablet TAKE ONE TABLET BY MOUTH ONCE DAILY IN THE MORNING.  30 tablet  3  . warfarin (COUMADIN) 5 MG tablet Take 1 tablet daily except 1/2 tablet on Mondays and Thursdays       No current facility-administered medications for this visit.    Past Medical History  Diagnosis Date  . Essential hypertension, benign   . DJD (degenerative joint disease)   . PMR (polymyalgia rheumatica)   . Gout   . Arthritis   . Aortic stenosis     Bioprosthetic AVR    . Hx of adenomatous colonic polyps   . Aneurysm of thoracic aorta     Ascending. 5.7 x 5.5cm - Bentall procedure  . Coronary atherosclerosis of native coronary artery     Multivessel - LIMA to LAD, SVG to OM, SVG to PDA  . H/O hiatal hernia   . Esophageal dysmotility   . Fibromyalgia   . Complete heart block     Status post pacemaker placement  . Atrial fibrillation     Documented on pacer interrogation 6/13    Past Surgical History  Procedure Laterality Date  . Bilateral knee arthroscopy    . Appendectomy    . Hemorroidectomy    . Left inguinal hernia repair    . Hernia repair    . Back surgery      1965 or 1966 lumbar back surgery  . Bentall procedure  01/24/2012    Procedure: BENTALL PROCEDURE;  Surgeon: Ivin Poot, MD;  Location: [place];  Service: Open Heart Surgery;  Laterality: N/A;  . Coronary artery bypass graft  01/24/2012    Procedure: CORONARY ARTERY BYPASS GRAFTING (CABG);  Surgeon: Ivin Poot, MD;  Location: [place];  Service: Open Heart Surgery;  Laterality: N/A;  . Pacemaker placement      Medtronic 4/13 - Dr. Rayann Heman  . Aortic valve replacement  01/24/12  . Esophagogastroduodenoscopy  06/17/2012    Procedure: ESOPHAGOGASTRODUODENOSCOPY (EGD);  Surgeon: Rogene Houston, MD;  [facility];  Service: Endoscopy;  Laterality: N/A;  ED    Social History Douglas [name] reports that he has never smoked. He has never used smokeless tobacco. Douglas [name] reports that he does not drink alcohol.  Review of Systems Other systems reviewed and negative except as outlined.  Physical Examination Filed Vitals:   06/15/14 1418  BP: 109/67  Pulse: 78   Filed Weights   06/15/14 1418  Weight: 222 lb (100.699 kg)    No acute distress.  HEENT: Conjunctiva and lids normal, oropharynx clear.  Neck: Supple, increased girth, no carotid bruits, no thyromegaly.  Thorax: Stable pacer pocket site. Well healing midline sternal incision.  Lungs: Decreased breath sounds,  nonlabored breathing at rest.  Cardiac: RRR, no S3, soft systolic murmur, no pericardial rub.  Abdomen: Soft, nontender, bowel sounds present, no guarding or rebound.  Extremities: 1+ edema.    Problem List and Plan   Precordial pain Recently noted, as outlined above. Some features sounds more GI in etiology, however not completely so. His ECG shows dual-chamber pacing. He is not in any distress and is hemodynamically stable at present. We will have him increase Protonix to twice a day for the next few weeks to see if this makes any difference. To be complete, we will also obtain followup ischemic testing via [place] and also an echocardiogram to assess aortic valve and root. Followup arranged. I asked him to seek urgent medical attention if symptoms escalate suddenly.  Aneurysm of thoracic aorta Bioprosthetic AVR with Bentall procedure. Stable on examination.  Aortic stenosis Echocardiogram from last year noted above.  Coronary atherosclerosis of native coronary artery Multivessel disease status post CABG in April 2013.  Pacemaker-Medtronic History of PAF and complete heart block.    Satira Sark, M.D., F.A.C.C.

## 2014-06-15 NOTE — Assessment & Plan Note (Signed)
Bioprosthetic AVR with Bentall procedure. Stable on examination.

## 2014-06-15 NOTE — Patient Instructions (Signed)
Your physician recommends that you schedule a follow-up appointment in: 1 month  Please increase your Protonix to twice a day for 1-2 weeks   Your physician has requested that you have an echocardiogram. Echocardiography is a painless test that uses sound waves to create images of your heart. It provides your doctor with information about the size and shape of your heart and how well your heart's chambers and valves are working. This procedure takes approximately one hour. There are no restrictions for this procedure.    Your physician has requested that you have a lexiscan myoview. For further information please visit [email or web address]. Please follow instruction sheet, as given.        Thank you for choosing [facility] !

## 2014-06-15 NOTE — Assessment & Plan Note (Signed)
Multivessel disease status post CABG in April 2013.

## 2014-06-15 NOTE — Assessment & Plan Note (Signed)
History of PAF and complete heart block.

## 2014-06-15 NOTE — Assessment & Plan Note (Signed)
Echocardiogram from last year noted above.

## 2014-06-15 NOTE — Assessment & Plan Note (Signed)
Recently noted, as outlined above. Some features sounds more GI in etiology, however not completely so. His ECG shows dual-chamber pacing. He is not in any distress and is hemodynamically stable at present. We will have him increase Protonix to twice a day for the next few weeks to see if this makes any difference. To be complete, we will also obtain followup ischemic testing via [place] and also an echocardiogram to assess aortic valve and root. Followup arranged. I asked him to seek urgent medical attention if symptoms escalate suddenly.

## 2014-06-16 ENCOUNTER — Ambulatory Visit (INDEPENDENT_AMBULATORY_CARE_PROVIDER_SITE_OTHER): Payer: Medicare Other | Admitting: *Deleted

## 2014-06-16 DIAGNOSIS — Z7901 Long term (current) use of anticoagulants: Secondary | ICD-10-CM

## 2014-06-16 DIAGNOSIS — Z952 Presence of prosthetic heart valve: Secondary | ICD-10-CM

## 2014-06-16 DIAGNOSIS — Z954 Presence of other heart-valve replacement: Secondary | ICD-10-CM

## 2014-06-16 DIAGNOSIS — I4891 Unspecified atrial fibrillation: Secondary | ICD-10-CM

## 2014-06-16 LAB — POCT INR: INR: 1.9

## 2014-06-22 ENCOUNTER — Encounter (HOSPITAL_COMMUNITY)
Admission: RE | Admit: 2014-06-22 | Discharge: 2014-06-22 | Disposition: A | Discharge status: Home / Self Care | Payer: Medicare Other | Source: Ambulatory Visit | Attending: Cardiology | Admitting: Cardiology

## 2014-06-22 ENCOUNTER — Encounter (HOSPITAL_COMMUNITY): Payer: Self-pay

## 2014-06-22 ENCOUNTER — Ambulatory Visit (HOSPITAL_COMMUNITY)
Admission: RE | Admit: 2014-06-22 | Discharge: 2014-06-22 | Disposition: A | Discharge status: Home / Self Care | Payer: Medicare Other | Source: Ambulatory Visit | Attending: Cardiology | Admitting: Cardiology

## 2014-06-22 DIAGNOSIS — I059 Rheumatic mitral valve disease, unspecified: Secondary | ICD-10-CM | POA: Diagnosis not present

## 2014-06-22 DIAGNOSIS — I10 Essential (primary) hypertension: Secondary | ICD-10-CM | POA: Insufficient documentation

## 2014-06-22 DIAGNOSIS — I519 Heart disease, unspecified: Secondary | ICD-10-CM | POA: Insufficient documentation

## 2014-06-22 DIAGNOSIS — I359 Nonrheumatic aortic valve disorder, unspecified: Secondary | ICD-10-CM | POA: Insufficient documentation

## 2014-06-22 DIAGNOSIS — R072 Precordial pain: Secondary | ICD-10-CM

## 2014-06-22 DIAGNOSIS — I2510 Atherosclerotic heart disease of native coronary artery without angina pectoris: Secondary | ICD-10-CM | POA: Diagnosis not present

## 2014-06-22 DIAGNOSIS — Z951 Presence of aortocoronary bypass graft: Secondary | ICD-10-CM | POA: Insufficient documentation

## 2014-06-22 DIAGNOSIS — R079 Chest pain, unspecified: Secondary | ICD-10-CM | POA: Insufficient documentation

## 2014-06-22 DIAGNOSIS — I4891 Unspecified atrial fibrillation: Secondary | ICD-10-CM | POA: Insufficient documentation

## 2014-06-22 DIAGNOSIS — Z950 Presence of cardiac pacemaker: Secondary | ICD-10-CM | POA: Diagnosis not present

## 2014-06-22 DIAGNOSIS — Z954 Presence of other heart-valve replacement: Secondary | ICD-10-CM | POA: Diagnosis not present

## 2014-06-22 MED ORDER — REGADENOSON 0.4 MG/5ML IV SOLN
0.4000 mg | Freq: Once | INTRAVENOUS | Status: AC | PRN
  Administered 2014-06-22: 0.4 mg via INTRAVENOUS

## 2014-06-22 MED ORDER — TECHNETIUM TC 99M SESTAMIBI GENERIC - CARDIOLITE
10.0000 | Freq: Once | INTRAVENOUS | Status: AC | PRN
  Administered 2014-06-22: 10 via INTRAVENOUS

## 2014-06-22 MED ORDER — SODIUM CHLORIDE 0.9 % IJ SOLN
10.0000 mL | INTRAMUSCULAR | Status: DC | PRN
  Administered 2014-06-22: 10 mL via INTRAVENOUS

## 2014-06-22 MED ORDER — REGADENOSON 0.4 MG/5ML IV SOLN
INTRAVENOUS | Status: AC
  Administered 2014-06-22: 0.4 mg via INTRAVENOUS
  Filled 2014-06-22: qty 5

## 2014-06-22 MED ORDER — TECHNETIUM TC 99M SESTAMIBI - CARDIOLITE
30.0000 | Freq: Once | INTRAVENOUS | Status: AC | PRN
  Administered 2014-06-22: 11:00:00 30 via INTRAVENOUS

## 2014-06-22 MED ORDER — SODIUM CHLORIDE 0.9 % IJ SOLN
INTRAMUSCULAR | Status: AC
  Administered 2014-06-22: 10 mL via INTRAVENOUS
  Filled 2014-06-22: qty 10

## 2014-06-22 NOTE — Progress Notes (Signed)
  Echocardiogram 2D Echocardiogram has been performed.  [place], Wallace 06/22/2014, 9:10 AM

## 2014-06-22 NOTE — Progress Notes (Signed)
Stress Lab Nurses Notes - [facility] 06/22/2014 Reason for doing test: CAD and Chest Pain Type of test: Wille Glaser Nurse performing test: Gerrit Halls, RN Nuclear Medicine Tech: Redmond Baseman Echo Tech: Not Applicable MD performing test: Branch/K.Purcell Nails NP Family MD: Willey Blade Test explained and consent signed: Yes.   IV started: Saline lock flushed, No redness or edema and Saline lock started in radiology Symptoms: None Treatment/Intervention: None Reason test stopped: protocol completed After recovery IV was: Discontinued via X-ray tech and No redness or edema Patient to return to Nuc. Med at : 11:35 Patient discharged: Home Patient's Condition upon discharge was: stable Comments: During test BP 95/59 & HR 73.  Recovery BP 112/68 & HR 70.  Symptoms resolved in recovery. Geanie Cooley T

## 2014-06-24 ENCOUNTER — Telehealth: Payer: Self-pay | Admitting: Cardiology

## 2014-06-24 ENCOUNTER — Encounter (HOSPITAL_COMMUNITY)
Admission: RE | Admit: 2014-06-24 | Discharge: 2014-06-24 | Disposition: A | Discharge status: Home / Self Care | Payer: Medicare Other | Source: Ambulatory Visit | Attending: Cardiology | Admitting: Cardiology

## 2014-06-24 DIAGNOSIS — R072 Precordial pain: Secondary | ICD-10-CM

## 2014-06-24 MED ORDER — PANTOPRAZOLE SODIUM 40 MG PO TBEC: DELAYED_RELEASE_TABLET | ORAL | Status: DC

## 2014-06-24 NOTE — Telephone Encounter (Signed)
Needs new RX for Protonix sent to [facility] due to dosage increase. / tgs

## 2014-07-14 ENCOUNTER — Ambulatory Visit (INDEPENDENT_AMBULATORY_CARE_PROVIDER_SITE_OTHER): Payer: Medicare Other | Admitting: *Deleted

## 2014-07-14 DIAGNOSIS — Z954 Presence of other heart-valve replacement: Secondary | ICD-10-CM | POA: Diagnosis not present

## 2014-07-14 DIAGNOSIS — I4891 Unspecified atrial fibrillation: Secondary | ICD-10-CM | POA: Diagnosis not present

## 2014-07-14 DIAGNOSIS — Z7901 Long term (current) use of anticoagulants: Secondary | ICD-10-CM | POA: Diagnosis not present

## 2014-07-14 DIAGNOSIS — Z952 Presence of prosthetic heart valve: Secondary | ICD-10-CM

## 2014-07-14 LAB — POCT INR: INR: 3.2

## 2014-07-23 ENCOUNTER — Encounter: Payer: Self-pay | Admitting: Internal Medicine

## 2014-07-23 ENCOUNTER — Ambulatory Visit (INDEPENDENT_AMBULATORY_CARE_PROVIDER_SITE_OTHER): Payer: Medicare Other | Admitting: *Deleted

## 2014-07-23 ENCOUNTER — Encounter: Payer: Self-pay | Admitting: *Deleted

## 2014-07-23 DIAGNOSIS — I442 Atrioventricular block, complete: Secondary | ICD-10-CM | POA: Diagnosis not present

## 2014-07-23 LAB — MDC_IDC_ENUM_SESS_TYPE_INCLINIC
Battery Impedance: 183 Ohm
Battery Voltage: 2.78 V
Brady Statistic AS VS Percent: 0 %
Date Time Interrogation Session: 20151002132048
Lead Channel Impedance Value: 523 Ohm
Lead Channel Pacing Threshold Amplitude: 0.75 V
Lead Channel Pacing Threshold Pulse Width: 0.4 ms
Lead Channel Pacing Threshold Pulse Width: 0.4 ms
Lead Channel Setting Pacing Amplitude: 2 V
Lead Channel Setting Pacing Amplitude: 2 V
Lead Channel Setting Pacing Pulse Width: 0.4 ms
Lead Channel Setting Sensing Sensitivity: 4 mV
MDC IDC MSMT BATTERY REMAINING LONGEVITY: 122 mo
MDC IDC MSMT LEADCHNL RA PACING THRESHOLD AMPLITUDE: 0.5 V
MDC IDC MSMT LEADCHNL RA SENSING INTR AMPL: 2 mV
MDC IDC MSMT LEADCHNL RV IMPEDANCE VALUE: 604 Ohm
MDC IDC STAT BRADY AP VP PERCENT: 59 %
MDC IDC STAT BRADY AP VS PERCENT: 0 %
MDC IDC STAT BRADY AS VP PERCENT: 41 %

## 2014-07-23 NOTE — Progress Notes (Signed)
PPM check in office. 

## 2014-07-26 ENCOUNTER — Ambulatory Visit: Payer: Medicare Other | Admitting: Cardiology

## 2014-07-29 ENCOUNTER — Other Ambulatory Visit: Payer: Self-pay | Admitting: Cardiology

## 2014-08-03 DIAGNOSIS — Z23 Encounter for immunization: Secondary | ICD-10-CM | POA: Diagnosis not present

## 2014-08-06 ENCOUNTER — Other Ambulatory Visit: Payer: Self-pay

## 2014-08-11 ENCOUNTER — Ambulatory Visit (INDEPENDENT_AMBULATORY_CARE_PROVIDER_SITE_OTHER): Payer: Medicare Other | Admitting: *Deleted

## 2014-08-11 DIAGNOSIS — Z954 Presence of other heart-valve replacement: Secondary | ICD-10-CM

## 2014-08-11 DIAGNOSIS — I4891 Unspecified atrial fibrillation: Secondary | ICD-10-CM | POA: Diagnosis not present

## 2014-08-11 DIAGNOSIS — Z7901 Long term (current) use of anticoagulants: Secondary | ICD-10-CM | POA: Diagnosis not present

## 2014-08-11 DIAGNOSIS — Z952 Presence of prosthetic heart valve: Secondary | ICD-10-CM

## 2014-08-11 LAB — POCT INR: INR: 2.7

## 2014-08-13 ENCOUNTER — Encounter: Payer: Self-pay | Admitting: *Deleted

## 2014-08-17 DIAGNOSIS — M170 Bilateral primary osteoarthritis of knee: Secondary | ICD-10-CM | POA: Diagnosis not present

## 2014-08-31 DIAGNOSIS — M25562 Pain in left knee: Secondary | ICD-10-CM | POA: Diagnosis not present

## 2014-09-06 DIAGNOSIS — Z79899 Other long term (current) drug therapy: Secondary | ICD-10-CM | POA: Diagnosis not present

## 2014-09-08 ENCOUNTER — Ambulatory Visit (INDEPENDENT_AMBULATORY_CARE_PROVIDER_SITE_OTHER): Payer: Medicare Other | Admitting: *Deleted

## 2014-09-08 DIAGNOSIS — Z954 Presence of other heart-valve replacement: Secondary | ICD-10-CM

## 2014-09-08 DIAGNOSIS — I4891 Unspecified atrial fibrillation: Secondary | ICD-10-CM

## 2014-09-08 DIAGNOSIS — Z7901 Long term (current) use of anticoagulants: Secondary | ICD-10-CM | POA: Diagnosis not present

## 2014-09-08 DIAGNOSIS — Z952 Presence of prosthetic heart valve: Secondary | ICD-10-CM

## 2014-09-08 LAB — POCT INR: INR: 2.7

## 2014-09-13 DIAGNOSIS — I10 Essential (primary) hypertension: Secondary | ICD-10-CM | POA: Diagnosis not present

## 2014-09-13 DIAGNOSIS — I4891 Unspecified atrial fibrillation: Secondary | ICD-10-CM | POA: Diagnosis not present

## 2014-09-24 ENCOUNTER — Other Ambulatory Visit: Payer: Self-pay | Admitting: Cardiology

## 2014-09-30 ENCOUNTER — Encounter (HOSPITAL_COMMUNITY): Payer: Self-pay | Admitting: Internal Medicine

## 2014-10-06 ENCOUNTER — Ambulatory Visit (INDEPENDENT_AMBULATORY_CARE_PROVIDER_SITE_OTHER): Payer: Medicare Other | Admitting: *Deleted

## 2014-10-06 DIAGNOSIS — Z954 Presence of other heart-valve replacement: Secondary | ICD-10-CM

## 2014-10-06 DIAGNOSIS — Z952 Presence of prosthetic heart valve: Secondary | ICD-10-CM

## 2014-10-06 DIAGNOSIS — Z7901 Long term (current) use of anticoagulants: Secondary | ICD-10-CM | POA: Diagnosis not present

## 2014-10-06 DIAGNOSIS — I4891 Unspecified atrial fibrillation: Secondary | ICD-10-CM

## 2014-10-06 LAB — POCT INR: INR: 3

## 2014-10-20 ENCOUNTER — Encounter: Payer: Self-pay | Admitting: *Deleted

## 2014-11-08 ENCOUNTER — Encounter: Payer: Self-pay | Admitting: Cardiology

## 2014-11-08 ENCOUNTER — Ambulatory Visit (INDEPENDENT_AMBULATORY_CARE_PROVIDER_SITE_OTHER): Payer: Medicare Other | Admitting: Cardiology

## 2014-11-08 VITALS — BP 128/76 | HR 76 | Ht 68.0 in | Wt 228.0 lb

## 2014-11-08 DIAGNOSIS — Z952 Presence of prosthetic heart valve: Secondary | ICD-10-CM

## 2014-11-08 DIAGNOSIS — Z954 Presence of other heart-valve replacement: Secondary | ICD-10-CM | POA: Diagnosis not present

## 2014-11-08 DIAGNOSIS — I2510 Atherosclerotic heart disease of native coronary artery without angina pectoris: Secondary | ICD-10-CM

## 2014-11-08 DIAGNOSIS — I4891 Unspecified atrial fibrillation: Secondary | ICD-10-CM | POA: Diagnosis not present

## 2014-11-08 NOTE — Assessment & Plan Note (Signed)
Symptomatically stable. Continues on beta blocker therapy. Recent Cardiolite in September 2015 showed no evidence of scar or ischemia.

## 2014-11-08 NOTE — Progress Notes (Signed)
Reason for visit: CAD, history of AVR  Clinical Summary Mr. Narula is a medically complex 81 y.o.male last seen in August 2015. He comes in today for a routine visit. Overall seems to be doing fairly well. Takes care of his ADLs including loading wood in his house furnace. He does not endorse any recurring angina symptoms or palpitations at this time, reports compliance with his medications.  Echocardiogram from September 2015 showed mild LVH with LVEF 86-76%, grade 2 diastolic dysfunction, stable aortic prosthesis with mean gradient 2 mmHg, normal aortic root size, mild to moderate mitral regurgitation, moderate left atrial enlargement, moderate RV dysfunction, PASP 40 mmHg. Lexiscan Cardiolite from September 2015 showed no evidence of scar or ischemia, reported global LV dysfunction with LVEF 20%, however this was not corroborated by echocardiogram done the same day.  He continues to follow in the anticoagulation clinic. No reported bleeding episodes.  We reviewed the results of his recent updated cardiac testing outlined above.  No Known Allergies  Current Outpatient Prescriptions  Medication Sig Dispense Refill  . allopurinol (ZYLOPRIM) 300 MG tablet Take 300 mg by mouth daily. Take 1 1/2 daily    . Diclofenac Sodium POWD as needed.    . furosemide (LASIX) 40 MG tablet TAKE 1 AND 1/2 TABLETS DAILY. 45 tablet 6  . metoprolol succinate (TOPROL-XL) 25 MG 24 hr tablet TAKE (2) TABLETS BY MOUTH DAILY. 60 tablet 6  . pantoprazole (PROTONIX) 40 MG tablet TAKE ONE TABLET BY MOUTH ONCE DAILY IN THE MORNING. 30 tablet 3  . warfarin (COUMADIN) 5 MG tablet Take 1 tablet daily except 1/2 tablet on Mondays and Thursdays     No current facility-administered medications for this visit.    Past Medical History  Diagnosis Date  . Essential hypertension, benign   . DJD (degenerative joint disease)   . PMR (polymyalgia rheumatica)   . Gout   . Arthritis   . Aortic stenosis     Bioprosthetic AVR    . Hx of adenomatous colonic polyps   . Aneurysm of thoracic aorta     Ascending. 5.7 x 5.5cm - Bentall procedure  . Coronary atherosclerosis of native coronary artery     Multivessel - LIMA to LAD, SVG to OM, SVG to PDA  . H/O hiatal hernia   . Esophageal dysmotility   . Fibromyalgia   . Complete heart block     Status post pacemaker placement  . Atrial fibrillation     Documented on pacer interrogation 6/13    Past Surgical History  Procedure Laterality Date  . Bilateral knee arthroscopy    . Appendectomy    . Hemorroidectomy    . Left inguinal hernia repair    . Hernia repair    . Back surgery      1965 or 1966 lumbar back surgery  . Bentall procedure  01/24/2012    Procedure: BENTALL PROCEDURE;  Surgeon: Ivin Poot, MD;  Location: [facility];  Service: Open Heart Surgery;  Laterality: N/A;  . Coronary artery bypass graft  01/24/2012    Procedure: CORONARY ARTERY BYPASS GRAFTING (CABG);  Surgeon: Ivin Poot, MD;  Location: [place];  Service: Open Heart Surgery;  Laterality: N/A;  . Pacemaker placement      Medtronic 4/13 - Dr. Rayann Heman  . Aortic valve replacement  01/24/12  . Esophagogastroduodenoscopy  06/17/2012    Procedure: ESOPHAGOGASTRODUODENOSCOPY (EGD);  Surgeon: Rogene Houston, MD;  [facility];  Service: Endoscopy;  Laterality: N/A;  ED  . Permanent pacemaker insertion N/A 02/01/2012    Procedure: PERMANENT PACEMAKER INSERTION;  Surgeon: Thompson Grayer, MD;  Location: [facility] CATH LAB;  Service: Cardiovascular;  Laterality: N/A;    Social History Mr. Saiki reports that he has never smoked. He has never used smokeless tobacco. Mr. Zukowski reports that he does not drink alcohol.  Review of Systems Complete review of systems negative except as otherwise outlined in the clinical summary and also the following. Chronic arthritic symptoms.  Physical Examination Filed Vitals:   11/08/14 0937  BP: 128/76  Pulse: 76   Filed Weights   11/08/14 0937  Weight: 228  lb (103.42 kg)    No acute distress.  HEENT: Conjunctiva and lids normal, oropharynx clear.  Neck: Supple, increased girth, no carotid bruits, no thyromegaly.  Thorax: Stable pacer pocket site. Well healing midline sternal incision.  Lungs: Decreased breath sounds, nonlabored breathing at rest.  Cardiac: RRR, no S3, soft systolic murmur, no pericardial rub.  Abdomen: Soft, nontender, bowel sounds present, no guarding or rebound.  Extremities: 1+ edema.    Problem List and Plan   Coronary atherosclerosis of native coronary artery Symptomatically stable. Continues on beta blocker therapy. Recent Cardiolite in September 2015 showed no evidence of scar or ischemia.   Aortic valve replaced Stable bioprosthetic AVR by recent follow-up echocardiogram. LVEF 55-60%.   Atrial fibrillation Symptom medically stable, continues on Toprol-XL and Coumadin.     Satira Sark, M.D., F.A.C.C.

## 2014-11-08 NOTE — Assessment & Plan Note (Signed)
Stable bioprosthetic AVR by recent follow-up echocardiogram. LVEF 55-60%.

## 2014-11-08 NOTE — Patient Instructions (Signed)
Your physician wants you to follow-up in: 6 months with Dr.McDowell You will receive a reminder letter in the mail two months in advance. If you don't receive a letter, please call our office to schedule the follow-up appointment.     Your physician recommends that you continue on your current medications as directed. Please refer to the Current Medication list given to you today.     Thank you for choosing [facility] !        

## 2014-11-08 NOTE — Assessment & Plan Note (Signed)
Symptom medically stable, continues on Toprol-XL and Coumadin.

## 2014-11-17 ENCOUNTER — Ambulatory Visit (INDEPENDENT_AMBULATORY_CARE_PROVIDER_SITE_OTHER): Payer: Medicare Other | Admitting: *Deleted

## 2014-11-17 DIAGNOSIS — Z952 Presence of prosthetic heart valve: Secondary | ICD-10-CM

## 2014-11-17 DIAGNOSIS — Z954 Presence of other heart-valve replacement: Secondary | ICD-10-CM

## 2014-11-17 DIAGNOSIS — I4891 Unspecified atrial fibrillation: Secondary | ICD-10-CM

## 2014-11-17 DIAGNOSIS — Z7901 Long term (current) use of anticoagulants: Secondary | ICD-10-CM | POA: Diagnosis not present

## 2014-11-17 LAB — POCT INR: INR: 2.5

## 2014-11-18 ENCOUNTER — Other Ambulatory Visit: Payer: Self-pay | Admitting: Cardiology

## 2014-12-27 DIAGNOSIS — M353 Polymyalgia rheumatica: Secondary | ICD-10-CM | POA: Diagnosis not present

## 2014-12-27 DIAGNOSIS — N189 Chronic kidney disease, unspecified: Secondary | ICD-10-CM | POA: Diagnosis not present

## 2014-12-27 DIAGNOSIS — D649 Anemia, unspecified: Secondary | ICD-10-CM | POA: Diagnosis not present

## 2014-12-27 DIAGNOSIS — I10 Essential (primary) hypertension: Secondary | ICD-10-CM | POA: Diagnosis not present

## 2014-12-27 DIAGNOSIS — M109 Gout, unspecified: Secondary | ICD-10-CM | POA: Diagnosis not present

## 2014-12-27 DIAGNOSIS — I2510 Atherosclerotic heart disease of native coronary artery without angina pectoris: Secondary | ICD-10-CM | POA: Diagnosis not present

## 2014-12-29 ENCOUNTER — Ambulatory Visit (INDEPENDENT_AMBULATORY_CARE_PROVIDER_SITE_OTHER): Payer: Medicare Other | Admitting: *Deleted

## 2014-12-29 DIAGNOSIS — I4891 Unspecified atrial fibrillation: Secondary | ICD-10-CM

## 2014-12-29 DIAGNOSIS — Z7901 Long term (current) use of anticoagulants: Secondary | ICD-10-CM

## 2014-12-29 DIAGNOSIS — Z952 Presence of prosthetic heart valve: Secondary | ICD-10-CM

## 2014-12-29 DIAGNOSIS — I480 Paroxysmal atrial fibrillation: Secondary | ICD-10-CM

## 2014-12-29 DIAGNOSIS — Z954 Presence of other heart-valve replacement: Secondary | ICD-10-CM | POA: Diagnosis not present

## 2014-12-29 LAB — POCT INR: INR: 1.9

## 2014-12-30 DIAGNOSIS — N3000 Acute cystitis without hematuria: Secondary | ICD-10-CM | POA: Diagnosis not present

## 2015-01-04 DIAGNOSIS — Z0001 Encounter for general adult medical examination with abnormal findings: Secondary | ICD-10-CM | POA: Diagnosis not present

## 2015-01-16 ENCOUNTER — Emergency Department (HOSPITAL_COMMUNITY): Payer: Medicare Other

## 2015-01-16 ENCOUNTER — Inpatient Hospital Stay (HOSPITAL_COMMUNITY)
Admission: EM | Admit: 2015-01-16 | Discharge: 2015-01-20 | DRG: 872 | Disposition: A | Discharge status: Home / Self Care | Payer: Medicare Other | Attending: Internal Medicine | Admitting: Internal Medicine

## 2015-01-16 ENCOUNTER — Encounter (HOSPITAL_COMMUNITY): Payer: Self-pay | Admitting: Emergency Medicine

## 2015-01-16 DIAGNOSIS — M353 Polymyalgia rheumatica: Secondary | ICD-10-CM | POA: Diagnosis present

## 2015-01-16 DIAGNOSIS — R509 Fever, unspecified: Secondary | ICD-10-CM | POA: Diagnosis not present

## 2015-01-16 DIAGNOSIS — Z954 Presence of other heart-valve replacement: Secondary | ICD-10-CM

## 2015-01-16 DIAGNOSIS — A419 Sepsis, unspecified organism: Secondary | ICD-10-CM

## 2015-01-16 DIAGNOSIS — I517 Cardiomegaly: Secondary | ICD-10-CM | POA: Diagnosis not present

## 2015-01-16 DIAGNOSIS — I442 Atrioventricular block, complete: Secondary | ICD-10-CM | POA: Diagnosis present

## 2015-01-16 DIAGNOSIS — R6510 Systemic inflammatory response syndrome (SIRS) of non-infectious origin without acute organ dysfunction: Principal | ICD-10-CM | POA: Diagnosis present

## 2015-01-16 DIAGNOSIS — K224 Dyskinesia of esophagus: Secondary | ICD-10-CM | POA: Diagnosis present

## 2015-01-16 DIAGNOSIS — I959 Hypotension, unspecified: Secondary | ICD-10-CM

## 2015-01-16 DIAGNOSIS — Z86010 Personal history of colonic polyps: Secondary | ICD-10-CM | POA: Diagnosis not present

## 2015-01-16 DIAGNOSIS — Z950 Presence of cardiac pacemaker: Secondary | ICD-10-CM | POA: Diagnosis present

## 2015-01-16 DIAGNOSIS — I129 Hypertensive chronic kidney disease with stage 1 through stage 4 chronic kidney disease, or unspecified chronic kidney disease: Secondary | ICD-10-CM | POA: Diagnosis present

## 2015-01-16 DIAGNOSIS — Z8249 Family history of ischemic heart disease and other diseases of the circulatory system: Secondary | ICD-10-CM

## 2015-01-16 DIAGNOSIS — M109 Gout, unspecified: Secondary | ICD-10-CM | POA: Diagnosis present

## 2015-01-16 DIAGNOSIS — I10 Essential (primary) hypertension: Secondary | ICD-10-CM | POA: Diagnosis present

## 2015-01-16 DIAGNOSIS — E860 Dehydration: Secondary | ICD-10-CM | POA: Diagnosis present

## 2015-01-16 DIAGNOSIS — Z79899 Other long term (current) drug therapy: Secondary | ICD-10-CM

## 2015-01-16 DIAGNOSIS — M797 Fibromyalgia: Secondary | ICD-10-CM | POA: Diagnosis present

## 2015-01-16 DIAGNOSIS — Z951 Presence of aortocoronary bypass graft: Secondary | ICD-10-CM | POA: Diagnosis not present

## 2015-01-16 DIAGNOSIS — I350 Nonrheumatic aortic (valve) stenosis: Secondary | ICD-10-CM | POA: Diagnosis present

## 2015-01-16 DIAGNOSIS — I482 Chronic atrial fibrillation: Secondary | ICD-10-CM | POA: Diagnosis present

## 2015-01-16 DIAGNOSIS — N390 Urinary tract infection, site not specified: Secondary | ICD-10-CM | POA: Diagnosis present

## 2015-01-16 DIAGNOSIS — M1990 Unspecified osteoarthritis, unspecified site: Secondary | ICD-10-CM | POA: Diagnosis present

## 2015-01-16 DIAGNOSIS — Z952 Presence of prosthetic heart valve: Secondary | ICD-10-CM | POA: Diagnosis not present

## 2015-01-16 DIAGNOSIS — J90 Pleural effusion, not elsewhere classified: Secondary | ICD-10-CM | POA: Diagnosis not present

## 2015-01-16 DIAGNOSIS — I2510 Atherosclerotic heart disease of native coronary artery without angina pectoris: Secondary | ICD-10-CM | POA: Diagnosis present

## 2015-01-16 DIAGNOSIS — N3000 Acute cystitis without hematuria: Secondary | ICD-10-CM

## 2015-01-16 DIAGNOSIS — E876 Hypokalemia: Secondary | ICD-10-CM | POA: Diagnosis present

## 2015-01-16 DIAGNOSIS — Z7901 Long term (current) use of anticoagulants: Secondary | ICD-10-CM

## 2015-01-16 DIAGNOSIS — N183 Chronic kidney disease, stage 3 (moderate): Secondary | ICD-10-CM | POA: Diagnosis present

## 2015-01-16 HISTORY — DX: Systemic inflammatory response syndrome (sirs) of non-infectious origin without acute organ dysfunction: R65.10

## 2015-01-16 LAB — URINALYSIS, ROUTINE W REFLEX MICROSCOPIC
BILIRUBIN URINE: NEGATIVE
Glucose, UA: NEGATIVE mg/dL
KETONES UR: NEGATIVE mg/dL
Nitrite: NEGATIVE
SPECIFIC GRAVITY, URINE: 1.02 (ref 1.005–1.030)
UROBILINOGEN UA: 0.2 mg/dL (ref 0.0–1.0)
pH: 5.5 (ref 5.0–8.0)

## 2015-01-16 LAB — CBC WITH DIFFERENTIAL/PLATELET
BASOS PCT: 0 % (ref 0–1)
Basophils Absolute: 0 10*3/uL (ref 0.0–0.1)
EOS PCT: 0 % (ref 0–5)
Eosinophils Absolute: 0 10*3/uL (ref 0.0–0.7)
HCT: 36.6 % — ABNORMAL LOW (ref 39.0–52.0)
Hemoglobin: 11.7 g/dL — ABNORMAL LOW (ref 13.0–17.0)
Lymphocytes Relative: 4 % — ABNORMAL LOW (ref 12–46)
Lymphs Abs: 0.4 10*3/uL — ABNORMAL LOW (ref 0.7–4.0)
MCH: 28.3 pg (ref 26.0–34.0)
MCHC: 32 g/dL (ref 30.0–36.0)
MCV: 88.4 fL (ref 78.0–100.0)
MONO ABS: 0.6 10*3/uL (ref 0.1–1.0)
MONOS PCT: 5 % (ref 3–12)
NEUTROS ABS: 11.2 10*3/uL — AB (ref 1.7–7.7)
NEUTROS PCT: 91 % — AB (ref 43–77)
PLATELETS: 202 10*3/uL (ref 150–400)
RBC: 4.14 MIL/uL — AB (ref 4.22–5.81)
RDW: 15.2 % (ref 11.5–15.5)
WBC: 12.2 10*3/uL — AB (ref 4.0–10.5)

## 2015-01-16 LAB — COMPREHENSIVE METABOLIC PANEL
ALK PHOS: 125 U/L — AB (ref 39–117)
ALT: 43 U/L (ref 0–53)
AST: 65 U/L — ABNORMAL HIGH (ref 0–37)
Albumin: 3.1 g/dL — ABNORMAL LOW (ref 3.5–5.2)
Anion gap: 11 (ref 5–15)
BUN: 28 mg/dL — ABNORMAL HIGH (ref 6–23)
CALCIUM: 8.5 mg/dL (ref 8.4–10.5)
CHLORIDE: 99 mmol/L (ref 96–112)
CO2: 24 mmol/L (ref 19–32)
Creatinine, Ser: 1.53 mg/dL — ABNORMAL HIGH (ref 0.50–1.35)
GFR calc Af Amer: 46 mL/min — ABNORMAL LOW (ref >90)
GFR, EST NON AFRICAN AMERICAN: 40 mL/min — AB (ref >90)
Glucose, Bld: 151 mg/dL — ABNORMAL HIGH (ref 70–99)
Potassium: 3.2 mmol/L — ABNORMAL LOW (ref 3.5–5.1)
Sodium: 134 mmol/L — ABNORMAL LOW (ref 135–145)
Total Bilirubin: 0.7 mg/dL (ref 0.3–1.2)
Total Protein: 6.4 g/dL (ref 6.0–8.3)

## 2015-01-16 LAB — URINE MICROSCOPIC-ADD ON

## 2015-01-16 LAB — I-STAT CG4 LACTIC ACID, ED: Lactic Acid, Venous: 1.93 mmol/L (ref 0.5–2.0)

## 2015-01-16 MED ORDER — SODIUM CHLORIDE 0.9 % IV BOLUS (SEPSIS)
500.0000 mL | Freq: Once | INTRAVENOUS | Status: AC
  Administered 2015-01-16: 500 mL via INTRAVENOUS

## 2015-01-16 MED ORDER — POTASSIUM CHLORIDE 20 MEQ PO PACK
PACK | ORAL | Status: AC
  Administered 2015-01-16: 20 meq
  Filled 2015-01-16: qty 1

## 2015-01-16 MED ORDER — SODIUM CHLORIDE 0.9 % IV BOLUS (SEPSIS)
500.0000 mL | Freq: Once | INTRAVENOUS | Status: AC
  Administered 2015-01-16: 1000 mL via INTRAVENOUS

## 2015-01-16 MED ORDER — POTASSIUM CHLORIDE CRYS ER 20 MEQ PO TBCR
20.0000 meq | EXTENDED_RELEASE_TABLET | Freq: Once | ORAL | Status: DC
  Filled 2015-01-16: qty 1

## 2015-01-16 MED ORDER — DEXTROSE 5 % IV SOLN
1.0000 g | Freq: Once | INTRAVENOUS | Status: AC
  Administered 2015-01-16: 1 g via INTRAVENOUS
  Filled 2015-01-16: qty 10

## 2015-01-16 MED ORDER — SODIUM CHLORIDE 0.9 % IV SOLN
INTRAVENOUS | Status: AC
  Administered 2015-01-16: 1000 mL via INTRAVENOUS
  Administered 2015-01-17: 16:00:00 via INTRAVENOUS

## 2015-01-16 NOTE — ED Notes (Signed)
Patient complaining of fever and chills x 2 days. Wife reports patient has had decreased appetite all week. Denies pain or diarrhea. States vomited x 1 today.

## 2015-01-16 NOTE — H&P (Signed)
[facility] History and Physical  WOODY KRONBERG KXF:[id number] DOB: 21-Jun-1928 DOA: 01/16/2015  Referring physician: ER physician. PCP: [name], MD   Chief Complaint: Fever and fatigue.  HPI: Douglas [name] is a 81 y.o. male with history of bioprosthetic aortic valve repair with Bentall procedure, CAD status post CABG, atrial fibrillation with complete heart block status post pacemaker placement on Coumadin presents to the ER because of subjective feeling of fever and chills over the last 1 week with weakness. Patient states that he has been having subjective feeling of fever or chills 1 week but today felt very weak and presents to the ER. Has been having some nonproductive cough but denies any shortness of breath chest pain nausea vomiting abdominal pain dysuria or any diarrhea. In the ER patient was found be febrile with hypotension and was given fluid bolus. Patient has been admitted for further management of SIRS.   Review of Systems: As presented in the history of presenting illness, rest negative.  Past Medical History  Diagnosis Date  . Essential hypertension, benign   . DJD (degenerative joint disease)   . PMR (polymyalgia rheumatica)   . Gout   . Arthritis   . Aortic stenosis     Bioprosthetic AVR  . Hx of adenomatous colonic polyps   . Aneurysm of thoracic aorta     Ascending. 5.7 x 5.5cm - Bentall procedure  . Coronary atherosclerosis of native coronary artery     Multivessel - LIMA to LAD, SVG to OM, SVG to PDA  . H/O hiatal hernia   . Esophageal dysmotility   . Fibromyalgia   . Complete heart block     Status post pacemaker placement  . Atrial fibrillation     Documented on pacer interrogation 6/13   Past Surgical History  Procedure Laterality Date  . Bilateral knee arthroscopy    . Appendectomy    . Hemorroidectomy    . Left inguinal hernia repair    . Hernia repair    . Back surgery      1965 or 1966 lumbar back surgery  . Bentall procedure   01/24/2012    Procedure: BENTALL PROCEDURE;  Surgeon: Ivin Poot, MD;  Location: [place];  Service: Open Heart Surgery;  Laterality: N/A;  . Coronary artery bypass graft  01/24/2012    Procedure: CORONARY ARTERY BYPASS GRAFTING (CABG);  Surgeon: Ivin Poot, MD;  Location: [place];  Service: Open Heart Surgery;  Laterality: N/A;  . Pacemaker placement      Medtronic 4/13 - Dr. Rayann Heman  . Aortic valve replacement  01/24/12  . Esophagogastroduodenoscopy  06/17/2012    Procedure: ESOPHAGOGASTRODUODENOSCOPY (EGD);  Surgeon: Rogene Houston, MD;  [facility];  Service: Endoscopy;  Laterality: N/A;  ED  . Permanent pacemaker insertion N/A 02/01/2012    Procedure: PERMANENT PACEMAKER INSERTION;  Surgeon: Thompson Grayer, MD;  Location: [facility] C[facility];  Service: Cardiovascular;  Laterality: N/A;   Social History:  reports that he has never smoked. He has never used smokeless tobacco. He reports that he does not drink alcohol or use illicit drugs. Where does patient live home. Can patient participate in ADLs? Yes.  No Known Allergies  Family History:  Family History  Problem Relation Age of Onset  . Kidney disease Mother   . Heart disease Father   . Anesthesia problems Neg Hx   . Hypotension Neg Hx   . Malignant hyperthermia Neg Hx   . Pseudochol deficiency Neg Hx  Prior to Admission medications   Medication Sig Start Date End Date Taking? Authorizing Provider  allopurinol (ZYLOPRIM) 300 MG tablet Take 450 mg by mouth daily. Take 1 1/2 daily   Yes Historical Provider, MD  furosemide (LASIX) 40 MG tablet TAKE 1 AND 1/2 TABLETS DAILY. 09/24/14  Yes Satira Sark, MD  metoprolol succinate (TOPROL-XL) 25 MG 24 hr tablet TAKE (2) TABLETS BY MOUTH DAILY. 09/24/14  Yes Satira Sark, MD  pantoprazole (PROTONIX) 40 MG tablet TAKE ONE TABLET BY MOUTH ONCE DAILY IN THE MORNING. 11/18/14  Yes Satira Sark, MD  warfarin (COUMADIN) 5 MG tablet Take 2.5-5 mg by mouth every evening. Take  1 tablet daily except 1/2 tablet on Mondays and Thursdays 01/05/14  Yes Satira Sark, MD    Physical Exam: Filed Vitals:   01/16/15 2030 01/16/15 2100 01/16/15 2130 01/16/15 2145  BP: 95/47 92/70 125/50   Pulse: 70 71 69   Temp:    98.1 F (36.7 C)  TempSrc:    Oral  Resp: 20 30 18    Height:      Weight:      SpO2: 98% 100% 100%      General:  Well-developed and nourished.  Eyes: Anicteric no pallor.  ENT: No discharge from the ears eyes nose or mouth.  Neck: No mass felt.  Cardiovascular: S1-S2 heard.  Respiratory: No rhonchi or crepitations.  Abdomen: Soft nontender bowel sounds present.  Skin: No rash.  Musculoskeletal: No edema.  Psychiatric: Appears normal.  Neurologic: Alert awake oriented to time place and person. Moves all extremities.  Labs on Admission:  Basic Metabolic Panel:  Recent Labs Lab 01/16/15 1857  NA 134*  K 3.2*  CL 99  CO2 24  GLUCOSE 151*  BUN 28*  CREATININE 1.53*  CALCIUM 8.5   Liver Function Tests:  Recent Labs Lab 01/16/15 1857  AST 65*  ALT 43  ALKPHOS 125*  BILITOT 0.7  PROT 6.4  ALBUMIN 3.1*   No results for input(s): LIPASE, AMYLASE in the last 168 hours. No results for input(s): AMMONIA in the last 168 hours. CBC:  Recent Labs Lab 01/16/15 1857  WBC 12.2*  NEUTROABS 11.2*  HGB 11.7*  HCT 36.6*  MCV 88.4  PLT 202   Cardiac Enzymes: No results for input(s): CKTOTAL, CKMB, CKMBINDEX, TROPONINI in the last 168 hours.  BNP (last 3 results) No results for input(s): BNP in the last 8760 hours.  ProBNP (last 3 results) No results for input(s): PROBNP in the last 8760 hours.  CBG: No results for input(s): GLUCAP in the last 168 hours.  Radiological Exams on Admission: Dg Chest 2 View  01/16/2015   CLINICAL DATA:  Fever and chills.  EXAM: CHEST  2 VIEW  COMPARISON:  Most recent chest imaging CT 02/11/2013  FINDINGS: Dual lead left-sided pacemaker remains in place. The cardiomediastinal contours  are unchanged. Patient is post median sternotomy with prosthetic aortic valve. Mild hyperinflation. Linear opacities at the left lung base, likely atelectasis or scar. Trace blunting of the costophrenic angles may be small effusions versus related to hyperinflation. Pulmonary vasculature is normal. No acute osseous abnormalities, there is degenerative change in the spine.  IMPRESSION: 1. Hyperinflation. Minimal atelectasis or scar at the left lung base, no consolidation to suggest pneumonia. 2. Minimal blunting of the costophrenic angles, small effusions versus related to hyperinflation.   Electronically Signed   By: Jeb Levering M.D.   On: 01/16/2015 20:04     Assessment/Plan Principal  Problem:   SIRS (systemic inflammatory response syndrome) Active Problems:   Essential hypertension, benign   Coronary atherosclerosis of native coronary artery   Pacemaker-Medtronic   Aortic valve replaced   1. SIRS - source not clear. Possibly from urine. Follow blood cultures and urine cultures check influenza PCR. Patient has been placed on empiric antibiotics for now until cultures are available. Continue gentle hydration and hold Lasix. 2. Chronic atrial fibrillation with history of complete heart block status post pacemaker placement - on Coumadin per pharmacy. 3. History of bioprosthetic aortic valve replacement with Bentall procedure - denies any chest pain or shortness of breath. 4. CAD status post CABG - denies any chest pain. 5. Chronic kidney disease stage III - closely follow metabolic panel.   DVT Prophylaxis on Coumadin.  Code Status: Full code.  Family Communication: Family at the bedside.  Disposition Plan: Admit to inpatient.    Kyung Muto N. [facility] Pager [phone].  If 7PM-7AM, please contact night-coverage [email or web address] Password [facility] 01/16/2015, 10:30 PM

## 2015-01-16 NOTE — ED Provider Notes (Signed)
CSN: [id number]     Arrival date & time 01/16/15  1733 History   First MD Initiated Contact with Patient 01/16/15 1823     Chief Complaint  Patient presents with  . Fever  . Chills     (Consider location/radiation/quality/duration/timing/severity/associated sxs/prior Treatment) Patient is a 81 y.o. male presenting with fever. The history is provided by the patient.  Fever Associated symptoms: chills   Associated symptoms: no chest pain    patient presents with fevers chills and generalized weakness. He has felt bad for last couple days. Decreased appetite. No dysuria. Has had an occasional cough. Some sinus congestion. Nausea without vomiting. No diarrhea. He has had decreased oral intake. No abdominal pain. No chest pain. Has had previous aortic valve replacement and atrial fibrillation.  Past Medical History  Diagnosis Date  . Essential hypertension, benign   . DJD (degenerative joint disease)   . PMR (polymyalgia rheumatica)   . Gout   . Arthritis   . Aortic stenosis     Bioprosthetic AVR  . Hx of adenomatous colonic polyps   . Aneurysm of thoracic aorta     Ascending. 5.7 x 5.5cm - Bentall procedure  . Coronary atherosclerosis of native coronary artery     Multivessel - LIMA to LAD, SVG to OM, SVG to PDA  . H/O hiatal hernia   . Esophageal dysmotility   . Fibromyalgia   . Complete heart block     Status post pacemaker placement  . Atrial fibrillation     Documented on pacer interrogation 6/13   Past Surgical History  Procedure Laterality Date  . Bilateral knee arthroscopy    . Appendectomy    . Hemorroidectomy    . Left inguinal hernia repair    . Hernia repair    . Back surgery      1965 or 1966 lumbar back surgery  . Bentall procedure  01/24/2012    Procedure: BENTALL PROCEDURE;  Surgeon: Ivin Poot, MD;  Location: [facility];  Service: Open Heart Surgery;  Laterality: N/A;  . Coronary artery bypass graft  01/24/2012    Procedure: CORONARY ARTERY BYPASS GRAFTING  (CABG);  Surgeon: Ivin Poot, MD;  Location: [facility];  Service: Open Heart Surgery;  Laterality: N/A;  . Pacemaker placement      [employer] 4/13 - Dr. Rayann Heman  . Aortic valve replacement  01/24/12  . Esophagogastroduodenoscopy  06/17/2012    Procedure: ESOPHAGOGASTRODUODENOSCOPY (EGD);  Surgeon: Rogene Houston, MD;  Location: [facility];  Service: Endoscopy;  Laterality: N/A;  ED  . Permanent pacemaker insertion N/A 02/01/2012    Procedure: PERMANENT PACEMAKER INSERTION;  Surgeon: Thompson Grayer, MD;  Location: [facility] CATH LAB;  Service: Cardiovascular;  Laterality: N/A;   Family History  Problem Relation Age of Onset  . Kidney disease Mother   . Heart disease Father   . Anesthesia problems Neg Hx   . Hypotension Neg Hx   . Malignant hyperthermia Neg Hx   . Pseudochol deficiency Neg Hx    History  Substance Use Topics  . Smoking status: Never Smoker   . Smokeless tobacco: Never Used  . Alcohol Use: No    Review of Systems  Constitutional: Positive for fever, chills and appetite change. Negative for diaphoresis.  Respiratory: Negative for shortness of breath.   Cardiovascular: Negative for chest pain.  Gastrointestinal: Negative for abdominal pain.  Skin: Negative for wound.  Neurological: Negative for seizures and weakness.  Psychiatric/Behavioral: Negative for agitation.  Allergies  Review of patient's allergies indicates no known allergies.  Home Medications   Prior to Admission medications   Medication Sig Start Date End Date Taking? Authorizing Provider  allopurinol (ZYLOPRIM) 300 MG tablet Take 450 mg by mouth daily. Take 1 1/2 daily   Yes Historical Provider, MD  furosemide (LASIX) 40 MG tablet TAKE 1 AND 1/2 TABLETS DAILY. 09/24/14  Yes Satira Sark, MD  metoprolol succinate (TOPROL-XL) 25 MG 24 hr tablet TAKE (2) TABLETS BY MOUTH DAILY. 09/24/14  Yes Satira Sark, MD  pantoprazole (PROTONIX) 40 MG tablet TAKE ONE TABLET BY MOUTH ONCE DAILY IN THE  MORNING. 11/18/14  Yes Satira Sark, MD  warfarin (COUMADIN) 5 MG tablet Take 2.5-5 mg by mouth every evening. Take 1 tablet daily except 1/2 tablet on Mondays and Thursdays 01/05/14  Yes Satira Sark, MD   BP 142/71 mmHg  Pulse 70  Temp(Src) 99.1 F (37.3 C) (Oral)  Resp 20  Ht 5\' 6"  (1.676 m)  Wt 225 lb 15.5 oz (102.5 kg)  BMI 36.49 kg/m2  SpO2 99% Physical Exam  Constitutional: He is oriented to person, place, and time. He appears well-developed.  HENT:  Head: Atraumatic.  Eyes: EOM are normal.  Cardiovascular: Normal rate.   Murmur heard. Pulmonary/Chest: Effort normal.  Abdominal: Soft. There is no tenderness.  Genitourinary:  No CVA tenderness  Neurological: He is alert and oriented to person, place, and time.  Skin: Skin is warm.    ED Course  Procedures (including critical care time) Labs Review Labs Reviewed  CBC WITH DIFFERENTIAL/PLATELET - Abnormal; Notable for the following:    WBC 12.2 (*)    RBC 4.14 (*)    Hemoglobin 11.7 (*)    HCT 36.6 (*)    Neutrophils Relative % 91 (*)    Neutro Abs 11.2 (*)    Lymphocytes Relative 4 (*)    Lymphs Abs 0.4 (*)    All other components within normal limits  COMPREHENSIVE METABOLIC PANEL - Abnormal; Notable for the following:    Sodium 134 (*)    Potassium 3.2 (*)    Glucose, Bld 151 (*)    BUN 28 (*)    Creatinine, Ser 1.53 (*)    Albumin 3.1 (*)    AST 65 (*)    Alkaline Phosphatase 125 (*)    GFR calc non Af Amer 40 (*)    GFR calc Af Amer 46 (*)    All other components within normal limits  URINALYSIS, ROUTINE W REFLEX MICROSCOPIC - Abnormal; Notable for the following:    APPearance HAZY (*)    Hgb urine dipstick TRACE (*)    Protein, ur TRACE (*)    Leukocytes, UA MODERATE (*)    All other components within normal limits  URINE MICROSCOPIC-ADD ON - Abnormal; Notable for the following:    Squamous Epithelial / LPF FEW (*)    Bacteria, UA FEW (*)    All other components within normal limits   COMPREHENSIVE METABOLIC PANEL - Abnormal; Notable for the following:    Glucose, Bld 111 (*)    Calcium 8.1 (*)    Total Protein 5.9 (*)    Albumin 2.8 (*)    AST 53 (*)    GFR calc non Af Amer 56 (*)    GFR calc Af Amer 64 (*)    All other components within normal limits  CBC WITH DIFFERENTIAL/PLATELET - Abnormal; Notable for the following:    RBC 3.96 (*)  Hemoglobin 11.0 (*)    HCT 35.3 (*)    Neutrophils Relative % 83 (*)    Lymphocytes Relative 9 (*)    All other components within normal limits  PROTIME-INR - Abnormal; Notable for the following:    Prothrombin Time 28.0 (*)    INR 2.59 (*)    All other components within normal limits  PROTIME-INR - Abnormal; Notable for the following:    Prothrombin Time 26.6 (*)    INR 2.43 (*)    All other components within normal limits  COMPREHENSIVE METABOLIC PANEL - Abnormal; Notable for the following:    Sodium 134 (*)    Glucose, Bld 130 (*)    Albumin 2.6 (*)    GFR calc non Af Amer 63 (*)    GFR calc Af Amer 73 (*)    All other components within normal limits  CBC - Abnormal; Notable for the following:    WBC 10.9 (*)    RBC 4.09 (*)    Hemoglobin 11.5 (*)    HCT 36.2 (*)    All other components within normal limits  CULTURE, BLOOD (ROUTINE X 2)  CULTURE, BLOOD (ROUTINE X 2)  URINE CULTURE  MRSA PCR SCREENING  INFLUENZA PANEL BY PCR (TYPE A & B, H1N1)  LACTIC ACID, PLASMA  TROPONIN I  CBC  PROTIME-INR  I-STAT CG4 LACTIC ACID, ED    Imaging Review No results found.   EKG Interpretation   Date/Time:  Sunday January 16 2015 18:42:29 EDT Ventricular Rate:  70 PR Interval:    QRS Duration: 160 QT Interval:  476 QTC Calculation: 514 R Axis:   -76 Text Interpretation:  VENTRICULAR PACED RHYTHM Left bundle branch block  Confirmed by Alvino Chapel  MD, Ovid Curd [phone]) on 01/16/2015 7:09:14 PM      MDM   Final diagnoses:  Acute cystitis without hematuria  Hypotension, unspecified hypotension type    Patient  with urinary tract infection. Hypertension but good lactic acid. Will admit to internal medicine.    Davonna Belling, MD 01/19/15 [phone]

## 2015-01-17 ENCOUNTER — Encounter (HOSPITAL_COMMUNITY): Payer: Self-pay | Admitting: *Deleted

## 2015-01-17 DIAGNOSIS — R509 Fever, unspecified: Secondary | ICD-10-CM | POA: Diagnosis not present

## 2015-01-17 LAB — COMPREHENSIVE METABOLIC PANEL
ALT: 39 U/L (ref 0–53)
AST: 53 U/L — AB (ref 0–37)
Albumin: 2.8 g/dL — ABNORMAL LOW (ref 3.5–5.2)
Alkaline Phosphatase: 104 U/L (ref 39–117)
Anion gap: 7 (ref 5–15)
BILIRUBIN TOTAL: 0.9 mg/dL (ref 0.3–1.2)
BUN: 22 mg/dL (ref 6–23)
CHLORIDE: 101 mmol/L (ref 96–112)
CO2: 27 mmol/L (ref 19–32)
CREATININE: 1.16 mg/dL (ref 0.50–1.35)
Calcium: 8.1 mg/dL — ABNORMAL LOW (ref 8.4–10.5)
GFR calc Af Amer: 64 mL/min — ABNORMAL LOW (ref >90)
GFR, EST NON AFRICAN AMERICAN: 56 mL/min — AB (ref >90)
Glucose, Bld: 111 mg/dL — ABNORMAL HIGH (ref 70–99)
Potassium: 3.8 mmol/L (ref 3.5–5.1)
SODIUM: 135 mmol/L (ref 135–145)
Total Protein: 5.9 g/dL — ABNORMAL LOW (ref 6.0–8.3)

## 2015-01-17 LAB — CBC WITH DIFFERENTIAL/PLATELET
BASOS PCT: 0 % (ref 0–1)
Basophils Absolute: 0 10*3/uL (ref 0.0–0.1)
EOS ABS: 0.1 10*3/uL (ref 0.0–0.7)
Eosinophils Relative: 1 % (ref 0–5)
HEMATOCRIT: 35.3 % — AB (ref 39.0–52.0)
Hemoglobin: 11 g/dL — ABNORMAL LOW (ref 13.0–17.0)
Lymphocytes Relative: 9 % — ABNORMAL LOW (ref 12–46)
Lymphs Abs: 0.9 10*3/uL (ref 0.7–4.0)
MCH: 27.8 pg (ref 26.0–34.0)
MCHC: 31.2 g/dL (ref 30.0–36.0)
MCV: 89.1 fL (ref 78.0–100.0)
MONO ABS: 0.6 10*3/uL (ref 0.1–1.0)
MONOS PCT: 7 % (ref 3–12)
Neutro Abs: 7.7 10*3/uL (ref 1.7–7.7)
Neutrophils Relative %: 83 % — ABNORMAL HIGH (ref 43–77)
Platelets: 170 10*3/uL (ref 150–400)
RBC: 3.96 MIL/uL — ABNORMAL LOW (ref 4.22–5.81)
RDW: 15.1 % (ref 11.5–15.5)
WBC: 9.4 10*3/uL (ref 4.0–10.5)

## 2015-01-17 LAB — INFLUENZA PANEL BY PCR (TYPE A & B)
H1N1 flu by pcr: NOT DETECTED
Influenza A By PCR: NEGATIVE
Influenza B By PCR: NEGATIVE

## 2015-01-17 LAB — LACTIC ACID, PLASMA: LACTIC ACID, VENOUS: 1.1 mmol/L (ref 0.5–2.0)

## 2015-01-17 LAB — TROPONIN I

## 2015-01-17 LAB — PROTIME-INR
INR: 2.59 — AB (ref 0.00–1.49)
PROTHROMBIN TIME: 28 s — AB (ref 11.6–15.2)

## 2015-01-17 LAB — MRSA PCR SCREENING: MRSA by PCR: NEGATIVE

## 2015-01-17 MED ORDER — CEFTRIAXONE SODIUM IN DEXTROSE 20 MG/ML IV SOLN: 1.0000 g | INTRAVENOUS | Status: DC

## 2015-01-17 MED ORDER — DEXTROSE 5 % IV SOLN
INTRAVENOUS | Status: AC
  Filled 2015-01-17: qty 2

## 2015-01-17 MED ORDER — ONDANSETRON HCL 4 MG PO TABS: 4.0000 mg | ORAL_TABLET | Freq: Four times a day (QID) | ORAL | Status: DC | PRN

## 2015-01-17 MED ORDER — ACETAMINOPHEN 650 MG RE SUPP: 650.0000 mg | Freq: Four times a day (QID) | RECTAL | Status: DC | PRN

## 2015-01-17 MED ORDER — METOPROLOL SUCCINATE ER 50 MG PO TB24
50.0000 mg | ORAL_TABLET | Freq: Every day | ORAL | Status: DC
  Administered 2015-01-17 – 2015-01-20 (×4): 50 mg via ORAL
  Filled 2015-01-17 (×4): qty 1

## 2015-01-17 MED ORDER — ACETAMINOPHEN 325 MG PO TABS: 650.0000 mg | ORAL_TABLET | Freq: Four times a day (QID) | ORAL | Status: DC | PRN

## 2015-01-17 MED ORDER — DEXTROSE 5 % IV SOLN
1.0000 g | Freq: Three times a day (TID) | INTRAVENOUS | Status: DC
  Administered 2015-01-17 – 2015-01-20 (×10): 1 g via INTRAVENOUS
  Filled 2015-01-17 (×13): qty 1

## 2015-01-17 MED ORDER — ALLOPURINOL 300 MG PO TABS
450.0000 mg | ORAL_TABLET | Freq: Every day | ORAL | Status: DC
  Administered 2015-01-17 – 2015-01-20 (×4): 450 mg via ORAL
  Filled 2015-01-17 (×4): qty 2

## 2015-01-17 MED ORDER — PANTOPRAZOLE SODIUM 40 MG PO TBEC
40.0000 mg | DELAYED_RELEASE_TABLET | Freq: Every day | ORAL | Status: DC
  Administered 2015-01-17 – 2015-01-20 (×4): 40 mg via ORAL
  Filled 2015-01-17 (×4): qty 1

## 2015-01-17 MED ORDER — DEXTROSE 5 % IV SOLN
2.0000 g | Freq: Once | INTRAVENOUS | Status: AC
  Administered 2015-01-17: 2 g via INTRAVENOUS
  Filled 2015-01-17: qty 2

## 2015-01-17 MED ORDER — VANCOMYCIN HCL IN DEXTROSE 750-5 MG/150ML-% IV SOLN
750.0000 mg | Freq: Two times a day (BID) | INTRAVENOUS | Status: DC
  Administered 2015-01-17 – 2015-01-20 (×6): 750 mg via INTRAVENOUS
  Filled 2015-01-17 (×8): qty 150

## 2015-01-17 MED ORDER — ONDANSETRON HCL 4 MG/2ML IJ SOLN: 4.0000 mg | Freq: Four times a day (QID) | INTRAMUSCULAR | Status: DC | PRN

## 2015-01-17 MED ORDER — VANCOMYCIN HCL 10 G IV SOLR
1250.0000 mg | Freq: Once | INTRAVENOUS | Status: AC
  Administered 2015-01-17: 1250 mg via INTRAVENOUS
  Filled 2015-01-17: qty 1250

## 2015-01-17 NOTE — Progress Notes (Signed)
ANTICOAGULATION CONSULT NOTE - Initial Consult  Pharmacy Consult for Coumadin (chronic Rx PTA) Indication: atrial fibrillation  No Known Allergies  Patient Measurements: Height: 5\' 6"  (167.6 cm) Weight: 217 lb (98.431 kg) IBW/kg (Calculated) : 63.8  Vital Signs: Temp: 98.2 F (36.8 C) (03/28 1200) Temp Source: Oral (03/28 1200) BP: 120/61 mmHg (03/28 1100) Pulse Rate: 71 (03/28 1100)  Labs:  Recent Labs  01/16/15 1857 01/16/15 2345 01/17/15 0527 01/17/15 0906  HGB 11.7*  --  11.0*  --   HCT 36.6*  --  35.3*  --   PLT 202  --  170  --   LABPROT  --   --   --  28.0*  INR  --   --   --  2.59*  CREATININE 1.53*  --  1.16  --   TROPONINI  --  <0.03  --   --     Estimated Creatinine Clearance: 51.1 mL/min (by C-G formula based on Cr of 1.16).   Medical History: Past Medical History  Diagnosis Date  . Essential hypertension, benign   . DJD (degenerative joint disease)   . PMR (polymyalgia rheumatica)   . Gout   . Arthritis   . Aortic stenosis     Bioprosthetic AVR  . Hx of adenomatous colonic polyps   . Aneurysm of thoracic aorta     Ascending. 5.7 x 5.5cm - Bentall procedure  . Coronary atherosclerosis of native coronary artery     Multivessel - LIMA to LAD, SVG to OM, SVG to PDA  . H/O hiatal hernia   . Esophageal dysmotility   . Fibromyalgia   . Complete heart block     Status post pacemaker placement  . Atrial fibrillation     Documented on pacer interrogation 6/13    Medications:  Prescriptions prior to admission  Medication Sig Dispense Refill Last Dose  . allopurinol (ZYLOPRIM) 300 MG tablet Take 450 mg by mouth daily. Take 1 1/2 daily   01/15/2015 at Unknown time  . furosemide (LASIX) 40 MG tablet TAKE 1 AND 1/2 TABLETS DAILY. 45 tablet 6 01/15/2015 at Unknown time  . metoprolol succinate (TOPROL-XL) 25 MG 24 hr tablet TAKE (2) TABLETS BY MOUTH DAILY. 60 tablet 6 01/16/2015 at 800A  . pantoprazole (PROTONIX) 40 MG tablet TAKE ONE TABLET BY MOUTH ONCE  DAILY IN THE MORNING. 30 tablet 3 01/15/2015 at Unknown time  . warfarin (COUMADIN) 5 MG tablet Take 2.5-5 mg by mouth every evening. Take 1 tablet daily except 1/2 tablet on Mondays and Thursdays   01/15/2015 at Unknown time    Assessment: 81yo male on chronic Coumadin PTA for h/o afib.  INR therapeutic on admission. Dosing regimen from Coumadin clinic noted below:  Anticoagulation Monitoring 12/29/2014  INR goal 2.0-3.0  Assoc. INR Date 12/29/2014  Associated INR 1.9  Pt. deviation No  Current weekly dose 30 mg  Sunday dose 5 mg  Monday dose 2.5 mg  Tuesday dose 5 mg  Wednesday dose 5 mg  Thursday dose 2.5 mg  Friday dose 5 mg  Saturday dose 5 mg  Weekly dose 30 mg  Dose description Take coumadin 1 tablet on Thursday then resume 1 tablet daily except 1/2 tablet on Mondays and Thursdays . . .  Return date 01/26/2015  VISIT REPORT     Goal of Therapy:  INR 2-3 Monitor platelets by anticoagulation protocol: Yes   Plan:  Coumadin 2.5mg  po today x 1 (home dose) INR daily  Hart Robinsons A 01/17/2015,12:57  PM   

## 2015-01-17 NOTE — Progress Notes (Signed)
ANTIBIOTIC CONSULT NOTE-Preliminary  Pharmacy Consult for Vancomycin and Cefepime Indication: Sepsis  No Known Allergies  Patient Measurements: Height: 5\' 6"  (167.6 cm) Weight: 217 lb (98.431 kg) IBW/kg (Calculated) : 63.8   Vital Signs: Temp: 97.5 F (36.4 C) (03/28 0000) Temp Source: Oral (03/28 0000) BP: 133/70 mmHg (03/28 0030) Pulse Rate: 70 (03/28 0030)  Labs:  Recent Labs  01/16/15 1857  WBC 12.2*  HGB 11.7*  PLT 202  CREATININE 1.53*    Estimated Creatinine Clearance: 38.7 mL/min (by C-G formula based on Cr of 1.53).  No results for input(s): VANCOTROUGH, VANCOPEAK, VANCORANDOM, GENTTROUGH, GENTPEAK, GENTRANDOM, TOBRATROUGH, TOBRAPEAK, TOBRARND, AMIKACINPEAK, AMIKACINTROU, AMIKACIN in the last 72 hours.   Microbiology: Recent Results (from the past 720 hour(s))  Culture, blood (routine x 2)     Status: None (Preliminary result)   Collection Time: 01/16/15  6:57 PM  Result Value Ref Range Status   Specimen Description BLOOD RIGHT HAND  Final   Special Requests BOTTLES DRAWN AEROBIC AND ANAEROBIC 6CC  Final   Culture PENDING  Incomplete   Report Status PENDING  Incomplete  Culture, blood (routine x 2)     Status: None (Preliminary result)   Collection Time: 01/16/15  7:13 PM  Result Value Ref Range Status   Specimen Description LEFT ANTECUBITAL  Final   Special Requests   Final    BOTTLES DRAWN AEROBIC AND ANAEROBIC 6CC DRAWN BY RN   Culture PENDING  Incomplete   Report Status PENDING  Incomplete    Medical History: Past Medical History  Diagnosis Date  . Essential hypertension, benign   . DJD (degenerative joint disease)   . PMR (polymyalgia rheumatica)   . Gout   . Arthritis   . Aortic stenosis     Bioprosthetic AVR  . Hx of adenomatous colonic polyps   . Aneurysm of thoracic aorta     Ascending. 5.7 x 5.5cm - Bentall procedure  . Coronary atherosclerosis of native coronary artery     Multivessel - LIMA to LAD, SVG to OM, SVG to PDA  . H/O  hiatal hernia   . Esophageal dysmotility   . Fibromyalgia   . Complete heart block     Status post pacemaker placement  . Atrial fibrillation     Documented on pacer interrogation 6/13    Medications:   Assessment: 81 yo male with 2 day history of chills, fever, vomiting, and generalized weakness. Broad spectrum antibiotics to be given for sepsis/ SIRS.  Goal of Therapy:  Vancomycin troughs 15-20 mcg/ml Eradicate infection  Plan:  Preliminary review of pertinent patient information completed.  Protocol will be initiated with one-time doses of Cefepime 2 Gm IV and Vancomycin 1250 mg IV.  [name] clinical pharmacist will complete review during morning rounds to assess patient and finalize treatment regimen.  Norberto Sorenson, [facility] 01/17/2015,12:44 AM

## 2015-01-17 NOTE — Plan of Care (Signed)
Problem: Consults Goal: General Medical Patient Education See Patient Education Module for specific education. Outcome: Progressing SIRS Systemic Inflammatory Response Syndrome

## 2015-01-17 NOTE — Progress Notes (Signed)
Subjective: [name] was admitted yesterday with a one-week history of weakness with fever and chills. He has been treated with cefepime, like a mycin and ceftriaxone. He initially had a mild leukocytosis with left shift. Blood and urine cultures are pending. He denies significant dysuria or cough. Chest x-ray reveals no evidence of pneumonia. Urinalysis reveals 3-6 WBCs with a positive leukocyte esterase. Objective: Vital signs in last 24 hours: Filed Vitals:   01/17/15 0300 01/17/15 0400 01/17/15 0500 01/17/15 0600  BP: 138/69 135/67 139/65 143/78  Pulse: 70 69 69 70  Temp:  98.1 F (36.7 C)    TempSrc:  Oral    Resp: 22 21 18 26   Height:      Weight:      SpO2: 100% 99% 99% 99%   Weight change:   Intake/Output Summary (Last 24 hours) at 01/17/15 0749 Last data filed at 01/17/15 0500  Gross per 24 hour  Intake   1125 ml  Output    350 ml  Net    775 ml    Physical Exam: Alert. Fully oriented. No distress. Lungs clear. Heart regular and paced on telemetry. Abdomen soft and nontender. Extremities reveal no edema or rash.  Lab Results:    Results for orders placed or performed during the hospital encounter of 01/16/15 (from the past 24 hour(s))  Culture, blood (routine x 2)     Status: None (Preliminary result)   Collection Time: 01/16/15  6:57 PM  Result Value Ref Range   Specimen Description BLOOD RIGHT HAND    Special Requests BOTTLES DRAWN AEROBIC AND ANAEROBIC [place]    Culture PENDING    Report Status PENDING   CBC with Differential     Status: Abnormal   Collection Time: 01/16/15  6:57 PM  Result Value Ref Range   WBC 12.2 (H) 4.0 - 10.5 K/uL   RBC 4.14 (L) 4.22 - 5.81 MIL/uL   Hemoglobin 11.7 (L) 13.0 - 17.0 g/dL   HCT 36.6 (L) 39.0 - 52.0 %   MCV 88.4 78.0 - 100.0 fL   MCH 28.3 26.0 - 34.0 pg   MCHC 32.0 30.0 - 36.0 g/dL   RDW 15.2 11.5 - 15.5 %   Platelets 202 150 - 400 K/uL   Neutrophils Relative % 91 (H) 43 - 77 %   Neutro Abs 11.2 (H) 1.7 - 7.7 K/uL    Lymphocytes Relative 4 (L) 12 - 46 %   Lymphs Abs 0.4 (L) 0.7 - 4.0 K/uL   Monocytes Relative 5 3 - 12 %   Monocytes Absolute 0.6 0.1 - 1.0 K/uL   Eosinophils Relative 0 0 - 5 %   Eosinophils Absolute 0.0 0.0 - 0.7 K/uL   Basophils Relative 0 0 - 1 %   Basophils Absolute 0.0 0.0 - 0.1 K/uL  Comprehensive metabolic panel     Status: Abnormal   Collection Time: 01/16/15  6:57 PM  Result Value Ref Range   Sodium 134 (L) 135 - 145 mmol/L   Potassium 3.2 (L) 3.5 - 5.1 mmol/L   Chloride 99 96 - 112 mmol/L   CO2 24 19 - 32 mmol/L   Glucose, Bld 151 (H) 70 - 99 mg/dL   BUN 28 (H) 6 - 23 mg/dL   Creatinine, Ser 1.53 (H) 0.50 - 1.35 mg/dL   Calcium 8.5 8.4 - 10.5 mg/dL   Total Protein 6.4 6.0 - 8.3 g/dL   Albumin 3.1 (L) 3.5 - 5.2 g/dL   AST 65 (H) 0 -  37 U/L   ALT 43 0 - 53 U/L   Alkaline Phosphatase 125 (H) 39 - 117 U/L   Total Bilirubin 0.7 0.3 - 1.2 mg/dL   GFR calc non Af Amer 40 (L) >90 mL/min   GFR calc Af Amer 46 (L) >90 mL/min   Anion gap 11 5 - 15  Culture, blood (routine x 2)     Status: None (Preliminary result)   Collection Time: 01/16/15  7:13 PM  Result Value Ref Range   Specimen Description LEFT ANTECUBITAL    Special Requests      BOTTLES DRAWN AEROBIC AND ANAEROBIC 6CC DRAWN BY RN   Culture PENDING    Report Status PENDING   I-Stat CG4 Lactic Acid, ED     Status: None   Collection Time: 01/16/15  7:14 PM  Result Value Ref Range   Lactic Acid, Venous 1.93 0.5 - 2.0 mmol/L  Urinalysis, Routine w reflex microscopic     Status: Abnormal   Collection Time: 01/16/15  9:03 PM  Result Value Ref Range   Color, Urine YELLOW YELLOW   APPearance HAZY (A) CLEAR   Specific Gravity, Urine 1.020 1.005 - 1.030   pH 5.5 5.0 - 8.0   Glucose, UA NEGATIVE NEGATIVE mg/dL   Hgb urine dipstick TRACE (A) NEGATIVE   Bilirubin Urine NEGATIVE NEGATIVE   Ketones, ur NEGATIVE NEGATIVE mg/dL   Protein, ur TRACE (A) NEGATIVE mg/dL   Urobilinogen, UA 0.2 0.0 - 1.0 mg/dL   Nitrite NEGATIVE  NEGATIVE   Leukocytes, UA MODERATE (A) NEGATIVE  Urine microscopic-add on     Status: Abnormal   Collection Time: 01/16/15  9:03 PM  Result Value Ref Range   Squamous Epithelial / LPF FEW (A) RARE   WBC, UA 3-6 <3 WBC/hpf   RBC / HPF 0-2 <3 RBC/hpf   Bacteria, UA FEW (A) RARE  Influenza panel by PCR (type A & B, H1N1)     Status: None   Collection Time: 01/16/15 10:15 PM  Result Value Ref Range   Influenza A By PCR NEGATIVE NEGATIVE   Influenza B By PCR NEGATIVE NEGATIVE   H1N1 flu by pcr NOT DETECTED NOT DETECTED  Lactic acid, plasma     Status: None   Collection Time: 01/16/15 11:45 PM  Result Value Ref Range   Lactic Acid, Venous 1.1 0.5 - 2.0 mmol/L  Troponin I     Status: None   Collection Time: 01/16/15 11:45 PM  Result Value Ref Range   Troponin I <0.03 <0.031 ng/mL  MRSA PCR Screening     Status: None   Collection Time: 01/17/15 12:25 AM  Result Value Ref Range   MRSA by PCR NEGATIVE NEGATIVE  Comprehensive metabolic panel     Status: Abnormal   Collection Time: 01/17/15  5:27 AM  Result Value Ref Range   Sodium 135 135 - 145 mmol/L   Potassium 3.8 3.5 - 5.1 mmol/L   Chloride 101 96 - 112 mmol/L   CO2 27 19 - 32 mmol/L   Glucose, Bld 111 (H) 70 - 99 mg/dL   BUN 22 6 - 23 mg/dL   Creatinine, Ser 1.16 0.50 - 1.35 mg/dL   Calcium 8.1 (L) 8.4 - 10.5 mg/dL   Total Protein 5.9 (L) 6.0 - 8.3 g/dL   Albumin 2.8 (L) 3.5 - 5.2 g/dL   AST 53 (H) 0 - 37 U/L   ALT 39 0 - 53 U/L   Alkaline Phosphatase 104 39 - 117 U/L  Total Bilirubin 0.9 0.3 - 1.2 mg/dL   GFR calc non Af Amer 56 (L) >90 mL/min   GFR calc Af Amer 64 (L) >90 mL/min   Anion gap 7 5 - 15  CBC with Differential/Platelet     Status: Abnormal   Collection Time: 01/17/15  5:27 AM  Result Value Ref Range   WBC 9.4 4.0 - 10.5 K/uL   RBC 3.96 (L) 4.22 - 5.81 MIL/uL   Hemoglobin 11.0 (L) 13.0 - 17.0 g/dL   HCT 35.3 (L) 39.0 - 52.0 %   MCV 89.1 78.0 - 100.0 fL   MCH 27.8 26.0 - 34.0 pg   MCHC 31.2 30.0 - 36.0  g/dL   RDW 15.1 11.5 - 15.5 %   Platelets 170 150 - 400 K/uL   Neutrophils Relative % 83 (H) 43 - 77 %   Neutro Abs 7.7 1.7 - 7.7 K/uL   Lymphocytes Relative 9 (L) 12 - 46 %   Lymphs Abs 0.9 0.7 - 4.0 K/uL   Monocytes Relative 7 3 - 12 %   Monocytes Absolute 0.6 0.1 - 1.0 K/uL   Eosinophils Relative 1 0 - 5 %   Eosinophils Absolute 0.1 0.0 - 0.7 K/uL   Basophils Relative 0 0 - 1 %   Basophils Absolute 0.0 0.0 - 0.1 K/uL     ABGS No results for input(s): PHART, PO2ART, TCO2, HCO3 in the last 72 hours.  Invalid input(s): PCO2 CULTURES Recent Results (from the past 240 hour(s))  Culture, blood (routine x 2)     Status: None (Preliminary result)   Collection Time: 01/16/15  6:57 PM  Result Value Ref Range Status   Specimen Description BLOOD RIGHT HAND  Final   Special Requests BOTTLES DRAWN AEROBIC AND ANAEROBIC 6CC  Final   Culture PENDING  Incomplete   Report Status PENDING  Incomplete  Culture, blood (routine x 2)     Status: None (Preliminary result)   Collection Time: 01/16/15  7:13 PM  Result Value Ref Range Status   Specimen Description LEFT ANTECUBITAL  Final   Special Requests   Final    BOTTLES DRAWN AEROBIC AND ANAEROBIC 6CC DRAWN BY RN   Culture PENDING  Incomplete   Report Status PENDING  Incomplete  MRSA PCR Screening     Status: None   Collection Time: 01/17/15 12:25 AM  Result Value Ref Range Status   MRSA by PCR NEGATIVE NEGATIVE Final    Comment:        The GeneXpert MRSA Assay (FDA approved for NASAL specimens only), is one component of a comprehensive MRSA colonization surveillance program. It is not intended to diagnose MRSA infection nor to guide or monitor treatment for MRSA infections.    Studies/Results: Dg Chest 2 View  01/16/2015   CLINICAL DATA:  Fever and chills.  EXAM: CHEST  2 VIEW  COMPARISON:  Most recent chest imaging CT 02/11/2013  FINDINGS: Dual lead left-sided pacemaker remains in place. The cardiomediastinal contours are  unchanged. Patient is post median sternotomy with prosthetic aortic valve. Mild hyperinflation. Linear opacities at the left lung base, likely atelectasis or scar. Trace blunting of the costophrenic angles may be small effusions versus related to hyperinflation. Pulmonary vasculature is normal. No acute osseous abnormalities, there is degenerative change in the spine.  IMPRESSION: 1. Hyperinflation. Minimal atelectasis or scar at the left lung base, no consolidation to suggest pneumonia. 2. Minimal blunting of the costophrenic angles, small effusions versus related to hyperinflation.   Electronically  Signed   By: Jeb Levering M.D.   On: 01/16/2015 20:04   Micro Results: Recent Results (from the past 240 hour(s))  Culture, blood (routine x 2)     Status: None (Preliminary result)   Collection Time: 01/16/15  6:57 PM  Result Value Ref Range Status   Specimen Description BLOOD RIGHT HAND  Final   Special Requests BOTTLES DRAWN AEROBIC AND ANAEROBIC 6CC  Final   Culture PENDING  Incomplete   Report Status PENDING  Incomplete  Culture, blood (routine x 2)     Status: None (Preliminary result)   Collection Time: 01/16/15  7:13 PM  Result Value Ref Range Status   Specimen Description LEFT ANTECUBITAL  Final   Special Requests   Final    BOTTLES DRAWN AEROBIC AND ANAEROBIC 6CC DRAWN BY RN   Culture PENDING  Incomplete   Report Status PENDING  Incomplete  MRSA PCR Screening     Status: None   Collection Time: 01/17/15 12:25 AM  Result Value Ref Range Status   MRSA by PCR NEGATIVE NEGATIVE Final    Comment:        The GeneXpert MRSA Assay (FDA approved for NASAL specimens only), is one component of a comprehensive MRSA colonization surveillance program. It is not intended to diagnose MRSA infection nor to guide or monitor treatment for MRSA infections.    Studies/Results: Dg Chest 2 View  01/16/2015   CLINICAL DATA:  Fever and chills.  EXAM: CHEST  2 VIEW  COMPARISON:  Most recent  chest imaging CT 02/11/2013  FINDINGS: Dual lead left-sided pacemaker remains in place. The cardiomediastinal contours are unchanged. Patient is post median sternotomy with prosthetic aortic valve. Mild hyperinflation. Linear opacities at the left lung base, likely atelectasis or scar. Trace blunting of the costophrenic angles may be small effusions versus related to hyperinflation. Pulmonary vasculature is normal. No acute osseous abnormalities, there is degenerative change in the spine.  IMPRESSION: 1. Hyperinflation. Minimal atelectasis or scar at the left lung base, no consolidation to suggest pneumonia. 2. Minimal blunting of the costophrenic angles, small effusions versus related to hyperinflation.   Electronically Signed   By: Jeb Levering M.D.   On: 01/16/2015 20:04   Medications:  I have reviewed the patient's current medications Scheduled Meds: . allopurinol  450 mg Oral Daily  . ceFEPime (MAXIPIME) IV  1 g Intravenous Q8H  . metoprolol succinate  50 mg Oral Daily  . pantoprazole  40 mg Oral Daily  . potassium chloride  20 mEq Oral Once   Continuous Infusions: . sodium chloride 100 mL/hr at 01/17/15 0100   PRN Meds:.acetaminophen **OR** acetaminophen, ondansetron **OR** ondansetron (ZOFRAN) IV   Assessment/Plan: #1. UTI. Blood and urine cultures pending. Continue current antibiotic therapy. White count now normal. Afebrile this morning. #2. Status post aortic valve replacement. #3. Hypokalemia. Resolved. #4. Status post pacemaker placement. Principal Problem:   SIRS (systemic inflammatory response syndrome) Active Problems:   Essential hypertension, benign   Coronary atherosclerosis of native coronary artery   Pacemaker-[employer]tr[employer]   Aortic valve replaced     LOS: 1 day   Jemel Ono 01/17/2015, 7:49 AM

## 2015-01-17 NOTE — Progress Notes (Signed)
  Echocardiogram 2D Echocardiogram has been performed.  Douglas [name] 01/17/2015, 12:29 PM

## 2015-01-17 NOTE — Care Management Note (Addendum)
    Page 1 of 1   01/20/2015     9:36:41 AM CARE MANAGEMENT NOTE 01/20/2015  Patient:  JURIEL, CID   Account Number:  [id number]  Date Initiated:  01/17/2015  Documentation initiated by:  Jolene Provost  Subjective/Objective Assessment:   Pt admitted with ?viral illness. Pt is from home, lives with wife, independent at baseline. Pt has no HH services or DME's prior to admission. No CM needs.     Action/Plan:   Anticipated DC Date:  01/20/2015   Anticipated DC Plan:  [place]  CM consult      Choice offered to / List presented to:             Status of service:  Completed, signed off Medicare Important Message given?  YES (If response is "NO", the following Medicare IM given date fields will be blank) Date Medicare IM given:  01/20/2015 Medicare IM given by:  Jolene Provost Date Additional Medicare IM given:   Additional Medicare IM given by:    Discharge Disposition:  HOME/SELF CARE  Per UR Regulation:  Reviewed for med. necessity/level of care/duration of stay  If discussed at [place] of Stay Meetings, dates discussed:    Comments:  01/17/2015 [place], RN, MSN, CM Pt plans to discharge home with self care. No CM needs. 01/17/2015 [place], RN, MSN, CM

## 2015-01-17 NOTE — Care Management Utilization Note (Signed)
UR completed 

## 2015-01-17 NOTE — Progress Notes (Signed)
ANTIBIOTIC CONSULT NOTE-Preliminary  Pharmacy Consult for Vancomycin and Cefepime Indication: Sepsis  No Known Allergies  Patient Measurements: Height: 5\' 6"  (167.6 cm) Weight: 217 lb (98.431 kg) IBW/kg (Calculated) : 63.8  Vital Signs: Temp: 98.2 F (36.8 C) (03/28 1200) Temp Source: Oral (03/28 1200) BP: 120/61 mmHg (03/28 1100) Pulse Rate: 71 (03/28 1100)  Labs:  Recent Labs  01/16/15 1857 01/17/15 0527  WBC 12.2* 9.4  HGB 11.7* 11.0*  PLT 202 170  CREATININE 1.53* 1.16    Estimated Creatinine Clearance: 51.1 mL/min (by C-G formula based on Cr of 1.16).  No results for input(s): VANCOTROUGH, VANCOPEAK, VANCORANDOM, GENTTROUGH, GENTPEAK, GENTRANDOM, TOBRATROUGH, TOBRAPEAK, TOBRARND, AMIKACINPEAK, AMIKACINTROU, AMIKACIN in the last 72 hours.   Microbiology: Recent Results (from the past 720 hour(s))  Culture, blood (routine x 2)     Status: None (Preliminary result)   Collection Time: 01/16/15  6:57 PM  Result Value Ref Range Status   Specimen Description BLOOD RIGHT HAND  Final   Special Requests BOTTLES DRAWN AEROBIC AND ANAEROBIC 6CC  Final   Culture NO GROWTH 1 DAY  Final   Report Status PENDING  Incomplete  Culture, blood (routine x 2)     Status: None (Preliminary result)   Collection Time: 01/16/15  7:13 PM  Result Value Ref Range Status   Specimen Description LEFT ANTECUBITAL  Final   Special Requests   Final    BOTTLES DRAWN AEROBIC AND ANAEROBIC 6CC DRAWN BY RN   Culture NO GROWTH 1 DAY  Final   Report Status PENDING  Incomplete  MRSA PCR Screening     Status: None   Collection Time: 01/17/15 12:25 AM  Result Value Ref Range Status   MRSA by PCR NEGATIVE NEGATIVE Final    Comment:        The GeneXpert MRSA Assay (FDA approved for NASAL specimens only), is one component of a comprehensive MRSA colonization surveillance program. It is not intended to diagnose MRSA infection nor to guide or monitor treatment for MRSA infections.     Medical  History: Past Medical History  Diagnosis Date  . Essential hypertension, benign   . DJD (degenerative joint disease)   . PMR (polymyalgia rheumatica)   . Gout   . Arthritis   . Aortic stenosis     Bioprosthetic AVR  . Hx of adenomatous colonic polyps   . Aneurysm of thoracic aorta     Ascending. 5.7 x 5.5cm - Bentall procedure  . Coronary atherosclerosis of native coronary artery     Multivessel - LIMA to LAD, SVG to OM, SVG to PDA  . H/O hiatal hernia   . Esophageal dysmotility   . Fibromyalgia   . Complete heart block     Status post pacemaker placement  . Atrial fibrillation     Documented on pacer interrogation 6/13   Vancomycin 3/28 >> Cefepime 3/28 >>   Assessment: 81 yo male with 2 day history of chills, fever, vomiting, and generalized weakness. Broad spectrum antibiotics to be given for sepsis/ SIRS.  Goal of Therapy:  Vancomycin troughs 15-20 mcg/ml Eradicate infection  Plan:  Preliminary review of pertinent patient information completed.  Protocol will be initiated with one-time doses of Cefepime 2 Gm IV and Vancomycin 1250 mg IV.  [name] clinical pharmacist will complete review during morning rounds to assess patient and finalize treatment regimen.  Addendum:  Continue Cefepime 1gm IV q8h  Continue Vancomycin 750mg  IV q12hrs  Check trough at steady state  Monitor labs,  progress, renal fxn, and cultures  Ena Dawley, RPH 01/17/2015,12:53 PM

## 2015-01-18 LAB — PROTIME-INR
INR: 2.43 — AB (ref 0.00–1.49)
Prothrombin Time: 26.6 seconds — ABNORMAL HIGH (ref 11.6–15.2)

## 2015-01-18 LAB — COMPREHENSIVE METABOLIC PANEL
ALK PHOS: 102 U/L (ref 39–117)
ALT: 33 U/L (ref 0–53)
AST: 37 U/L (ref 0–37)
Albumin: 2.6 g/dL — ABNORMAL LOW (ref 3.5–5.2)
Anion gap: 8 (ref 5–15)
BILIRUBIN TOTAL: 0.5 mg/dL (ref 0.3–1.2)
BUN: 20 mg/dL (ref 6–23)
CO2: 23 mmol/L (ref 19–32)
CREATININE: 1.04 mg/dL (ref 0.50–1.35)
Calcium: 8.4 mg/dL (ref 8.4–10.5)
Chloride: 103 mmol/L (ref 96–112)
GFR calc non Af Amer: 63 mL/min — ABNORMAL LOW (ref >90)
GFR, EST AFRICAN AMERICAN: 73 mL/min — AB (ref >90)
Glucose, Bld: 130 mg/dL — ABNORMAL HIGH (ref 70–99)
Potassium: 3.6 mmol/L (ref 3.5–5.1)
SODIUM: 134 mmol/L — AB (ref 135–145)
TOTAL PROTEIN: 6 g/dL (ref 6.0–8.3)

## 2015-01-18 LAB — URINE CULTURE
Colony Count: NO GROWTH
Culture: NO GROWTH

## 2015-01-18 LAB — CBC
HEMATOCRIT: 36.2 % — AB (ref 39.0–52.0)
HEMOGLOBIN: 11.5 g/dL — AB (ref 13.0–17.0)
MCH: 28.1 pg (ref 26.0–34.0)
MCHC: 31.8 g/dL (ref 30.0–36.0)
MCV: 88.5 fL (ref 78.0–100.0)
Platelets: 189 10*3/uL (ref 150–400)
RBC: 4.09 MIL/uL — ABNORMAL LOW (ref 4.22–5.81)
RDW: 15.3 % (ref 11.5–15.5)
WBC: 10.9 10*3/uL — ABNORMAL HIGH (ref 4.0–10.5)

## 2015-01-18 MED ORDER — WARFARIN SODIUM 5 MG PO TABS
5.0000 mg | ORAL_TABLET | Freq: Once | ORAL | Status: AC
  Administered 2015-01-18: 5 mg via ORAL
  Filled 2015-01-18: qty 1

## 2015-01-18 MED ORDER — WARFARIN - PHARMACIST DOSING INPATIENT
Status: DC
  Administered 2015-01-19: 16:00:00

## 2015-01-18 NOTE — Progress Notes (Signed)
PT TRANSFERRING TO ROOM 311 AS MED/SURG. PT ALERT AND ORIENTED. VS STABLE. AFEBRILE. O2 SAT 100%  ON ROOM AIR. VOIDING CLEAR AMBER URINE. LT FA IV PATENT. DENIES ANY DISCOMFORT. TRANSFER REPORT CALLED TO RENAE RN ON 300.

## 2015-01-18 NOTE — Progress Notes (Signed)
Subjective: Douglas [name] is feeling better. Fever has resolved. He really has no localizing symptoms to explain his recent fever. He is eating better. He denies any chills or sweats overnight.  Objective: Vital signs in last 24 hours: Filed Vitals:   01/18/15 0200 01/18/15 0300 01/18/15 0400 01/18/15 0500  BP: 137/62  141/65   Pulse: 70 70 70 70  Temp:   98.6 F (37 C)   TempSrc:   Axillary   Resp: 27 25 23 22   Height:      Weight:    225 lb 15.5 oz (102.5 kg)  SpO2: 93% 95% 93% 92%   Weight change: 8 lb 15.5 oz (4.069 kg)  Intake/Output Summary (Last 24 hours) at 01/18/15 0704 Last data filed at 01/18/15 0500  Gross per 24 hour  Intake 1223.5 ml  Output    650 ml  Net  573.5 ml    Physical Exam: Alert. No distress. Lungs clear. Heart regular and paced at 70 bpm. Abdomen soft and nontender. Extremities reveal no edema or rash. Neuro at baseline.  Lab Results:    Results for orders placed or performed during the hospital encounter of 01/16/15 (from the past 24 hour(s))  Protime-INR     Status: Abnormal   Collection Time: 01/17/15  9:06 AM  Result Value Ref Range   Prothrombin Time 28.0 (H) 11.6 - 15.2 seconds   INR 2.59 (H) 0.00 - 1.49  Protime-INR     Status: Abnormal   Collection Time: 01/18/15  5:01 AM  Result Value Ref Range   Prothrombin Time 26.6 (H) 11.6 - 15.2 seconds   INR 2.43 (H) 0.00 - 1.49  Comprehensive metabolic panel     Status: Abnormal   Collection Time: 01/18/15  5:01 AM  Result Value Ref Range   Sodium 134 (L) 135 - 145 mmol/L   Potassium 3.6 3.5 - 5.1 mmol/L   Chloride 103 96 - 112 mmol/L   CO2 23 19 - 32 mmol/L   Glucose, Bld 130 (H) 70 - 99 mg/dL   BUN 20 6 - 23 mg/dL   Creatinine, Ser 1.04 0.50 - 1.35 mg/dL   Calcium 8.4 8.4 - 10.5 mg/dL   Total Protein 6.0 6.0 - 8.3 g/dL   Albumin 2.6 (L) 3.5 - 5.2 g/dL   AST 37 0 - 37 U/L   ALT 33 0 - 53 U/L   Alkaline Phosphatase 102 39 - 117 U/L   Total Bilirubin 0.5 0.3 - 1.2 mg/dL   GFR calc non  Af Amer 63 (L) >90 mL/min   GFR calc Af Amer 73 (L) >90 mL/min   Anion gap 8 5 - 15  CBC     Status: Abnormal   Collection Time: 01/18/15  5:01 AM  Result Value Ref Range   WBC 10.9 (H) 4.0 - 10.5 K/uL   RBC 4.09 (L) 4.22 - 5.81 MIL/uL   Hemoglobin 11.5 (L) 13.0 - 17.0 g/dL   HCT 36.2 (L) 39.0 - 52.0 %   MCV 88.5 78.0 - 100.0 fL   MCH 28.1 26.0 - 34.0 pg   MCHC 31.8 30.0 - 36.0 g/dL   RDW 15.3 11.5 - 15.5 %   Platelets 189 150 - 400 K/uL     ABGS No results for input(s): PHART, PO2ART, TCO2, HCO3 in the last 72 hours.  Invalid input(s): PCO2 CULTURES Recent Results (from the past 240 hour(s))  Culture, blood (routine x 2)     Status: None (Preliminary result)  Collection Time: 01/16/15  6:57 PM  Result Value Ref Range Status   Specimen Description BLOOD RIGHT HAND  Final   Special Requests BOTTLES DRAWN AEROBIC AND ANAEROBIC 6CC  Final   Culture NO GROWTH 1 DAY  Final   Report Status PENDING  Incomplete  Culture, blood (routine x 2)     Status: None (Preliminary result)   Collection Time: 01/16/15  7:13 PM  Result Value Ref Range Status   Specimen Description LEFT ANTECUBITAL  Final   Special Requests   Final    BOTTLES DRAWN AEROBIC AND ANAEROBIC 6CC DRAWN BY RN   Culture NO GROWTH 1 DAY  Final   Report Status PENDING  Incomplete  MRSA PCR Screening     Status: None   Collection Time: 01/17/15 12:25 AM  Result Value Ref Range Status   MRSA by PCR NEGATIVE NEGATIVE Final    Comment:        The GeneXpert MRSA Assay (FDA approved for NASAL specimens only), is one component of a comprehensive MRSA colonization surveillance program. It is not intended to diagnose MRSA infection nor to guide or monitor treatment for MRSA infections.    Studies/Results: Dg Chest 2 View  01/16/2015   CLINICAL DATA:  Fever and chills.  EXAM: CHEST  2 VIEW  COMPARISON:  Most recent chest imaging CT 02/11/2013  FINDINGS: Dual lead left-sided pacemaker remains in place. The  cardiomediastinal contours are unchanged. Patient is post median sternotomy with prosthetic aortic valve. Mild hyperinflation. Linear opacities at the left lung base, likely atelectasis or scar. Trace blunting of the costophrenic angles may be small effusions versus related to hyperinflation. Pulmonary vasculature is normal. No acute osseous abnormalities, there is degenerative change in the spine.  IMPRESSION: 1. Hyperinflation. Minimal atelectasis or scar at the left lung base, no consolidation to suggest pneumonia. 2. Minimal blunting of the costophrenic angles, small effusions versus related to hyperinflation.   Electronically Signed   By: Jeb Levering M.D.   On: 01/16/2015 20:04   Micro Results: Recent Results (from the past 240 hour(s))  Culture, blood (routine x 2)     Status: None (Preliminary result)   Collection Time: 01/16/15  6:57 PM  Result Value Ref Range Status   Specimen Description BLOOD RIGHT HAND  Final   Special Requests BOTTLES DRAWN AEROBIC AND ANAEROBIC 6CC  Final   Culture NO GROWTH 1 DAY  Final   Report Status PENDING  Incomplete  Culture, blood (routine x 2)     Status: None (Preliminary result)   Collection Time: 01/16/15  7:13 PM  Result Value Ref Range Status   Specimen Description LEFT ANTECUBITAL  Final   Special Requests   Final    BOTTLES DRAWN AEROBIC AND ANAEROBIC 6CC DRAWN BY RN   Culture NO GROWTH 1 DAY  Final   Report Status PENDING  Incomplete  MRSA PCR Screening     Status: None   Collection Time: 01/17/15 12:25 AM  Result Value Ref Range Status   MRSA by PCR NEGATIVE NEGATIVE Final    Comment:        The GeneXpert MRSA Assay (FDA approved for NASAL specimens only), is one component of a comprehensive MRSA colonization surveillance program. It is not intended to diagnose MRSA infection nor to guide or monitor treatment for MRSA infections.    Studies/Results: Dg Chest 2 View  01/16/2015   CLINICAL DATA:  Fever and chills.  EXAM: CHEST   2 VIEW  COMPARISON:  Most  recent chest imaging CT 02/11/2013  FINDINGS: Dual lead left-sided pacemaker remains in place. The cardiomediastinal contours are unchanged. Patient is post median sternotomy with prosthetic aortic valve. Mild hyperinflation. Linear opacities at the left lung base, likely atelectasis or scar. Trace blunting of the costophrenic angles may be small effusions versus related to hyperinflation. Pulmonary vasculature is normal. No acute osseous abnormalities, there is degenerative change in the spine.  IMPRESSION: 1. Hyperinflation. Minimal atelectasis or scar at the left lung base, no consolidation to suggest pneumonia. 2. Minimal blunting of the costophrenic angles, small effusions versus related to hyperinflation.   Electronically Signed   By: Jeb Levering M.D.   On: 01/16/2015 20:04   Medications:  I have reviewed the patient's current medications Scheduled Meds: . allopurinol  450 mg Oral Daily  . ceFEPime (MAXIPIME) IV  1 g Intravenous Q8H  . metoprolol succinate  50 mg Oral Daily  . pantoprazole  40 mg Oral Daily  . potassium chloride  20 mEq Oral Once  . vancomycin  750 mg Intravenous Q12H   Continuous Infusions:  PRN Meds:.acetaminophen **OR** acetaminophen, ondansetron **OR** ondansetron (ZOFRAN) IV   Assessment/Plan: #1. Possible UTI. Urine culture pending. Blood cultures so far negative. White count 10.9 today. #2. Status post aortic valve replacement. Echo reveals no signs of vegetation. LV function is normal. #3. Chronic kidney disease. Creatinine is below baseline. #4. Chronic anticoagulation. INR is therapeutic. #5. Transaminase elevations. Resolved.  Moved from the ICU. Await cultures. Continue vancomycin and cefepime. Principal Problem:   SIRS (systemic inflammatory response syndrome) Active Problems:   Essential hypertension, benign   Coronary atherosclerosis of native coronary artery   Pacemaker-[employer]   Aortic valve replaced     LOS: 2  days   Vikas Wegmann 01/18/2015, 7:04 AM

## 2015-01-18 NOTE — Progress Notes (Signed)
Patient transfer from ICU. Sitting in the chair tolerated well.

## 2015-01-18 NOTE — Progress Notes (Signed)
ANTICOAGULATION CONSULT NOTE  Pharmacy Consult for Coumadin Indication: atrial fibrillation  No Known Allergies  Patient Measurements: Height: 5\' 6"  (167.6 cm) Weight: 225 lb 15.5 oz (102.5 kg) IBW/kg (Calculated) : 63.8  Vital Signs: Temp: 98.5 F (36.9 C) (03/29 0733) Temp Source: Axillary (03/29 0733) BP: 135/62 mmHg (03/29 0800) Pulse Rate: 70 (03/29 0800)  Labs:  Recent Labs  01/16/15 1857 01/16/15 2345 01/17/15 0527 01/17/15 0906 01/18/15 0501  HGB 11.7*  --  11.0*  --  11.5*  HCT 36.6*  --  35.3*  --  36.2*  PLT 202  --  170  --  189  LABPROT  --   --   --  28.0* 26.6*  INR  --   --   --  2.59* 2.43*  CREATININE 1.53*  --  1.16  --  1.04  TROPONINI  --  <0.03  --   --   --     Estimated Creatinine Clearance: 58.2 mL/min (by C-G formula based on Cr of 1.04).   Medical History: Past Medical History  Diagnosis Date  . Essential hypertension, benign   . DJD (degenerative joint disease)   . PMR (polymyalgia rheumatica)   . Gout   . Arthritis   . Aortic stenosis     Bioprosthetic AVR  . Hx of adenomatous colonic polyps   . Aneurysm of thoracic aorta     Ascending. 5.7 x 5.5cm - Bentall procedure  . Coronary atherosclerosis of native coronary artery     Multivessel - LIMA to LAD, SVG to OM, SVG to PDA  . H/O hiatal hernia   . Esophageal dysmotility   . Fibromyalgia   . Complete heart block     Status post pacemaker placement  . Atrial fibrillation     Documented on pacer interrogation 6/13    Medications:  Prescriptions prior to admission  Medication Sig Dispense Refill Last Dose  . allopurinol (ZYLOPRIM) 300 MG tablet Take 450 mg by mouth daily. Take 1 1/2 daily   01/15/2015 at Unknown time  . furosemide (LASIX) 40 MG tablet TAKE 1 AND 1/2 TABLETS DAILY. 45 tablet 6 01/15/2015 at Unknown time  . metoprolol succinate (TOPROL-XL) 25 MG 24 hr tablet TAKE (2) TABLETS BY MOUTH DAILY. 60 tablet 6 01/16/2015 at 800A  . pantoprazole (PROTONIX) 40 MG tablet  TAKE ONE TABLET BY MOUTH ONCE DAILY IN THE MORNING. 30 tablet 3 01/15/2015 at Unknown time  . warfarin (COUMADIN) 5 MG tablet Take 2.5-5 mg by mouth every evening. Take 1 tablet daily except 1/2 tablet on Mondays and Thursdays   01/15/2015 at Unknown time    Assessment: 81yo male on chronic Coumadin PTA for h/o afib.  Home dose listed above.  INR therapeutic on admission.  No bleeding noted.   Goal of Therapy:  INR 2-3   Plan:  Coumadin 5mg  po today x 1 (home dose) INR daily  Rayli Wiederhold, Lavonia Drafts 01/18/2015,10:45 AM

## 2015-01-19 ENCOUNTER — Inpatient Hospital Stay (HOSPITAL_COMMUNITY): Payer: Medicare Other

## 2015-01-19 LAB — PROTIME-INR
INR: 2.2 — AB (ref 0.00–1.49)
Prothrombin Time: 24.6 seconds — ABNORMAL HIGH (ref 11.6–15.2)

## 2015-01-19 MED ORDER — WARFARIN SODIUM 5 MG PO TABS
5.0000 mg | ORAL_TABLET | Freq: Once | ORAL | Status: AC
  Administered 2015-01-19: 5 mg via ORAL
  Filled 2015-01-19: qty 1

## 2015-01-19 NOTE — Progress Notes (Signed)
Subjective: This is a patient of Dr. Ria Comment who was admitted with systemic inflammatory response syndrome. He does not have a definite source. He had some urinary symptoms but his urine culture is negative. Blood cultures are still pending but negative at 24 hours. His echocardiogram did not show evidence of vegetations. His symptoms were basically respiratory.  Objective: Vital signs in last 24 hours: Temp:  [98.2 F (36.8 C)-99.1 F (37.3 C)] 99 F (37.2 C) (03/30 0529) Pulse Rate:  [69-73] 73 (03/30 0529) Resp:  [20] 20 (03/30 0529) BP: (129-149)/(59-71) 149/66 mmHg (03/30 0529) SpO2:  [96 %-100 %] 100 % (03/30 0529) Weight:  [102.967 kg (227 lb)] 102.967 kg (227 lb) (03/30 0529) Weight change: 0.467 kg (1 lb 0.5 oz) Last BM Date: 01/18/15  Intake/Output from previous day: 03/29 0701 - 03/30 0700 In: 1210 [P.O.:960; IV Piggyback:250] Out: 1151 [Urine:1150; Stool:1]  PHYSICAL EXAM General appearance: alert, cooperative and no distress Resp: rhonchi bilaterally Cardio: regular rate and rhythm, S1, S2 normal, no murmur, click, rub or gallop GI: soft, non-tender; bowel sounds normal; no masses,  no organomegaly Extremities: extremities normal, atraumatic, no cyanosis or edema  Lab Results:  Results for orders placed or performed during the hospital encounter of 01/16/15 (from the past 48 hour(s))  Protime-INR     Status: Abnormal   Collection Time: 01/17/15  9:06 AM  Result Value Ref Range   Prothrombin Time 28.0 (H) 11.6 - 15.2 seconds   INR 2.59 (H) 0.00 - 1.49  Protime-INR     Status: Abnormal   Collection Time: 01/18/15  5:01 AM  Result Value Ref Range   Prothrombin Time 26.6 (H) 11.6 - 15.2 seconds   INR 2.43 (H) 0.00 - 1.49  Comprehensive metabolic panel     Status: Abnormal   Collection Time: 01/18/15  5:01 AM  Result Value Ref Range   Sodium 134 (L) 135 - 145 mmol/L   Potassium 3.6 3.5 - 5.1 mmol/L   Chloride 103 96 - 112 mmol/L   CO2 23 19 - 32 mmol/L   Glucose, Bld 130 (H) 70 - 99 mg/dL   BUN 20 6 - 23 mg/dL   Creatinine, Ser 1.04 0.50 - 1.35 mg/dL   Calcium 8.4 8.4 - 10.5 mg/dL   Total Protein 6.0 6.0 - 8.3 g/dL   Albumin 2.6 (L) 3.5 - 5.2 g/dL   AST 37 0 - 37 U/L   ALT 33 0 - 53 U/L   Alkaline Phosphatase 102 39 - 117 U/L   Total Bilirubin 0.5 0.3 - 1.2 mg/dL   GFR calc non Af Amer 63 (L) >90 mL/min   GFR calc Af Amer 73 (L) >90 mL/min    Comment: (NOTE) The eGFR has been calculated using the CKD EPI equation. This calculation has not been validated in all clinical situations. eGFR's persistently <90 mL/min signify possible Chronic Kidney Disease.    Anion gap 8 5 - 15  CBC     Status: Abnormal   Collection Time: 01/18/15  5:01 AM  Result Value Ref Range   WBC 10.9 (H) 4.0 - 10.5 K/uL   RBC 4.09 (L) 4.22 - 5.81 MIL/uL   Hemoglobin 11.5 (L) 13.0 - 17.0 g/dL   HCT 36.2 (L) 39.0 - 52.0 %   MCV 88.5 78.0 - 100.0 fL   MCH 28.1 26.0 - 34.0 pg   MCHC 31.8 30.0 - 36.0 g/dL   RDW 15.3 11.5 - 15.5 %   Platelets 189 150 - 400 K/uL  Protime-INR     Status: Abnormal   Collection Time: 01/19/15  5:44 AM  Result Value Ref Range   Prothrombin Time 24.6 (H) 11.6 - 15.2 seconds   INR 2.20 (H) 0.00 - 1.49    ABGS No results for input(s): PHART, PO2ART, TCO2, HCO3 in the last 72 hours.  Invalid input(s): PCO2 CULTURES Recent Results (from the past 240 hour(s))  Culture, blood (routine x 2)     Status: None (Preliminary result)   Collection Time: 01/16/15  6:57 PM  Result Value Ref Range Status   Specimen Description BLOOD RIGHT HAND  Final   Special Requests BOTTLES DRAWN AEROBIC AND ANAEROBIC 6CC  Final   Culture NO GROWTH 1 DAY  Final   Report Status PENDING  Incomplete  Culture, blood (routine x 2)     Status: None (Preliminary result)   Collection Time: 01/16/15  7:13 PM  Result Value Ref Range Status   Specimen Description LEFT ANTECUBITAL  Final   Special Requests   Final    BOTTLES DRAWN AEROBIC AND ANAEROBIC 6CC DRAWN  BY RN   Culture NO GROWTH 1 DAY  Final   Report Status PENDING  Incomplete  Urine culture     Status: None   Collection Time: 01/16/15  9:03 PM  Result Value Ref Range Status   Specimen Description URINE, CLEAN CATCH  Final   Special Requests NONE  Final   Colony Count NO GROWTH Performed at [facility]   Final   Culture NO GROWTH Performed at [facility]   Final   Report Status 01/18/2015 FINAL  Final  MRSA PCR Screening     Status: None   Collection Time: 01/17/15 12:25 AM  Result Value Ref Range Status   MRSA by PCR NEGATIVE NEGATIVE Final    Comment:        The GeneXpert MRSA Assay (FDA approved for NASAL specimens only), is one component of a comprehensive MRSA colonization surveillance program. It is not intended to diagnose MRSA infection nor to guide or monitor treatment for MRSA infections.    Studies/Results: No results found.  Medications:  Prior to Admission:  Prescriptions prior to admission  Medication Sig Dispense Refill Last Dose  . allopurinol (ZYLOPRIM) 300 MG tablet Take 450 mg by mouth daily. Take 1 1/2 daily   01/15/2015 at Unknown time  . furosemide (LASIX) 40 MG tablet TAKE 1 AND 1/2 TABLETS DAILY. 45 tablet 6 01/15/2015 at Unknown time  . metoprolol succinate (TOPROL-XL) 25 MG 24 hr tablet TAKE (2) TABLETS BY MOUTH DAILY. 60 tablet 6 01/16/2015 at 800A  . pantoprazole (PROTONIX) 40 MG tablet TAKE ONE TABLET BY MOUTH ONCE DAILY IN THE MORNING. 30 tablet 3 01/15/2015 at Unknown time  . warfarin (COUMADIN) 5 MG tablet Take 2.5-5 mg by mouth every evening. Take 1 tablet daily except 1/2 tablet on Mondays and Thursdays   01/15/2015 at Unknown time   Scheduled: . allopurinol  450 mg Oral Daily  . ceFEPime (MAXIPIME) IV  1 g Intravenous Q8H  . metoprolol succinate  50 mg Oral Daily  . pantoprazole  40 mg Oral Daily  . potassium chloride  20 mEq Oral Once  . vancomycin  750 mg Intravenous Q12H  . Warfarin - Pharmacist Dosing Inpatient    Does not apply Q24H   Continuous:  FTD:[id number] **OR** acetaminophen, ondansetron **OR** ondansetron (ZOFRAN) IV  Assesment: He is admitted with systemic inflammatory response syndrome without a definite source. I'm going to have  him do a chest x-ray two-view because his symptoms were mostly respiratory and he was dehydrated on admission  He had dehydration which has resolved  He has a history of coronary artery occlusive disease but that is stable. He has a pacemaker and aortic valve replacement. Principal Problem:   SIRS (systemic inflammatory response syndrome) Active Problems:   Essential hypertension, benign   Coronary atherosclerosis of native coronary artery   Pacemaker-Medtronic   Aortic valve replaced    Plan: Continue current treatments chest x-ray today and try to get him up more    LOS: 3 days   Valora Norell L 01/19/2015, 8:42 AM

## 2015-01-19 NOTE — Progress Notes (Addendum)
ANTICOAGULATION CONSULT NOTE  Pharmacy Consult for Coumadin Indication: atrial fibrillation  No Known Allergies  Patient Measurements: Height: 5\' 6"  (167.6 cm) Weight: 227 lb (102.967 kg) IBW/kg (Calculated) : 63.8  Vital Signs: Temp: 99 F (37.2 C) (03/30 0529) Temp Source: Oral (03/30 0529) BP: 149/66 mmHg (03/30 0529) Pulse Rate: 73 (03/30 0529)  Labs:  Recent Labs  01/16/15 1857 01/16/15 2345 01/17/15 0527 01/17/15 0906 01/18/15 0501 01/19/15 0544  HGB 11.7*  --  11.0*  --  11.5*  --   HCT 36.6*  --  35.3*  --  36.2*  --   PLT 202  --  170  --  189  --   LABPROT  --   --   --  28.0* 26.6* 24.6*  INR  --   --   --  2.59* 2.43* 2.20*  CREATININE 1.53*  --  1.16  --  1.04  --   TROPONINI  --  <0.03  --   --   --   --    Estimated Creatinine Clearance: 58.4 mL/min (by C-G formula based on Cr of 1.04).  Medical History: Past Medical History  Diagnosis Date  . Essential hypertension, benign   . DJD (degenerative joint disease)   . PMR (polymyalgia rheumatica)   . Gout   . Arthritis   . Aortic stenosis     Bioprosthetic AVR  . Hx of adenomatous colonic polyps   . Aneurysm of thoracic aorta     Ascending. 5.7 x 5.5cm - Bentall procedure  . Coronary atherosclerosis of native coronary artery     Multivessel - LIMA to LAD, SVG to OM, SVG to PDA  . H/O hiatal hernia   . Esophageal dysmotility   . Fibromyalgia   . Complete heart block     Status post pacemaker placement  . Atrial fibrillation     Documented on pacer interrogation 6/13   Medications:  Prescriptions prior to admission  Medication Sig Dispense Refill Last Dose  . allopurinol (ZYLOPRIM) 300 MG tablet Take 450 mg by mouth daily. Take 1 1/2 daily   01/15/2015 at Unknown time  . furosemide (LASIX) 40 MG tablet TAKE 1 AND 1/2 TABLETS DAILY. 45 tablet 6 01/15/2015 at Unknown time  . metoprolol succinate (TOPROL-XL) 25 MG 24 hr tablet TAKE (2) TABLETS BY MOUTH DAILY. 60 tablet 6 01/16/2015 at 800A  .  pantoprazole (PROTONIX) 40 MG tablet TAKE ONE TABLET BY MOUTH ONCE DAILY IN THE MORNING. 30 tablet 3 01/15/2015 at Unknown time  . warfarin (COUMADIN) 5 MG tablet Take 2.5-5 mg by mouth every evening. Take 1 tablet daily except 1/2 tablet on Mondays and Thursdays   01/15/2015 at Unknown time   Assessment: 81yo male on chronic Coumadin PTA for h/o afib.  Home dose listed above.  INR therapeutic on admission.  No bleeding noted.  Records from Coumadin clinic noted:  Anticoagulation Monitoring 12/29/2014  INR goal 2.0-3.0  Assoc. INR Date 12/29/2014  Associated INR 1.9  Pt. deviation No  Current weekly dose 30 mg  Sunday dose 5 mg  Monday dose 2.5 mg  Tuesday dose 5 mg  Wednesday dose 5 mg  Thursday dose 2.5 mg  Friday dose 5 mg  Saturday dose 5 mg  Weekly dose 30 mg  Dose description Take coumadin 1 tablet on Thursday then resume 1 tablet daily except 1/2 tablet on Mondays and Thursdays . . .  Return date 01/26/2015  VISIT REPORT    Goal of Therapy:  INR 2-3   Plan:  Coumadin 5mg  po today x 1 (home dose) INR daily  Jordie Schreur A 01/19/2015,10:29 AM

## 2015-01-20 LAB — PROTIME-INR
INR: 2.25 — ABNORMAL HIGH (ref 0.00–1.49)
Prothrombin Time: 25 seconds — ABNORMAL HIGH (ref 11.6–15.2)

## 2015-01-20 MED ORDER — LEVOFLOXACIN 500 MG PO TABS: 500.0000 mg | ORAL_TABLET | Freq: Every day | ORAL | Status: DC

## 2015-01-20 NOTE — Progress Notes (Signed)
Subjective: He says he feels well. He has no complaints. His breathing is okay. He's not having any urinary symptoms. He has no pain. He wants to go home.  Objective: Vital signs in last 24 hours: Temp:  [98.2 F (36.8 C)-98.8 F (37.1 C)] 98.2 F (36.8 C) (03/31 0611) Pulse Rate:  [69-70] 70 (03/31 0611) Resp:  [20-21] 21 (03/31 0611) BP: (109-137)/(57-62) 137/62 mmHg (03/31 0611) SpO2:  [97 %-100 %] 97 % (03/31 0611) Weight:  [103.057 kg (227 lb 3.2 oz)] 103.057 kg (227 lb 3.2 oz) (03/31 0611) Weight change: 0.091 kg (3.2 oz) Last BM Date: 01/17/15  Intake/Output from previous day: 03/30 0701 - 03/31 0700 In: 920 [P.O.:720; IV Piggyback:200] Out: 1700 [Urine:1700]  PHYSICAL EXAM General appearance: alert, cooperative and no distress Resp: clear to auscultation bilaterally Cardio: irregularly irregular rhythm GI: soft, non-tender; bowel sounds normal; no masses,  no organomegaly Extremities: extremities normal, atraumatic, no cyanosis or edema  Lab Results:  Results for orders placed or performed during the hospital encounter of 01/16/15 (from the past 48 hour(s))  Protime-INR     Status: Abnormal   Collection Time: 01/19/15  5:44 AM  Result Value Ref Range   Prothrombin Time 24.6 (H) 11.6 - 15.2 seconds   INR 2.20 (H) 0.00 - 1.49  Protime-INR     Status: Abnormal   Collection Time: 01/20/15  6:27 AM  Result Value Ref Range   Prothrombin Time 25.0 (H) 11.6 - 15.2 seconds   INR 2.25 (H) 0.00 - 1.49    ABGS No results for input(s): PHART, PO2ART, TCO2, HCO3 in the last 72 hours.  Invalid input(s): PCO2 CULTURES Recent Results (from the past 240 hour(s))  Culture, blood (routine x 2)     Status: None (Preliminary result)   Collection Time: 01/16/15  6:57 PM  Result Value Ref Range Status   Specimen Description BLOOD RIGHT HAND  Final   Special Requests BOTTLES DRAWN AEROBIC AND ANAEROBIC 6CC  Final   Culture NO ORGANISMS SEEN  Final   Report Status PENDING   Incomplete  Culture, blood (routine x 2)     Status: None (Preliminary result)   Collection Time: 01/16/15  7:13 PM  Result Value Ref Range Status   Specimen Description LEFT ANTECUBITAL  Final   Special Requests   Final    BOTTLES DRAWN AEROBIC AND ANAEROBIC 6CC DRAWN BY RN   Culture NO ORGANISMS SEEN  Final   Report Status PENDING  Incomplete  Urine culture     Status: None   Collection Time: 01/16/15  9:03 PM  Result Value Ref Range Status   Specimen Description URINE, CLEAN CATCH  Final   Special Requests NONE  Final   Colony Count NO GROWTH Performed at [facility]   Final   Culture NO GROWTH Performed at [facility]   Final   Report Status 01/18/2015 FINAL  Final  MRSA PCR Screening     Status: None   Collection Time: 01/17/15 12:25 AM  Result Value Ref Range Status   MRSA by PCR NEGATIVE NEGATIVE Final    Comment:        The GeneXpert MRSA Assay (FDA approved for NASAL specimens only), is one component of a comprehensive MRSA colonization surveillance program. It is not intended to diagnose MRSA infection nor to guide or monitor treatment for MRSA infections.    Studies/Results: Dg Chest 2 View  01/19/2015   CLINICAL DATA:  .  Weakness.  Prior CABG.  EXAM: CHEST  2 VIEW  COMPARISON:  None.  FINDINGS: Hyperinflation. Prior median sternotomy. Accentuation of expected thoracic kyphosis. Pacer with leads at right atrium and right ventricle. No lead discontinuity.  Patient minimally rotated left on the frontal. Midline trachea. Mild cardiomegaly. Trace bilateral pleural effusions. No pneumothorax. No congestive failure. Mild subsegmental atelectasis or scarring at the left lung base.  IMPRESSION: Trace bilateral pleural effusions with left base atelectasis or scar.  Hyperinflation with mild cardiomegaly.  No congestive failure.   Electronically Signed   By: Abigail Miyamoto M.D.   On: 01/19/2015 14:45    Medications:  Prior to Admission:  Prescriptions  prior to admission  Medication Sig Dispense Refill Last Dose  . allopurinol (ZYLOPRIM) 300 MG tablet Take 450 mg by mouth daily. Take 1 1/2 daily   01/15/2015 at Unknown time  . furosemide (LASIX) 40 MG tablet TAKE 1 AND 1/2 TABLETS DAILY. 45 tablet 6 01/15/2015 at Unknown time  . metoprolol succinate (TOPROL-XL) 25 MG 24 hr tablet TAKE (2) TABLETS BY MOUTH DAILY. 60 tablet 6 01/16/2015 at 800A  . pantoprazole (PROTONIX) 40 MG tablet TAKE ONE TABLET BY MOUTH ONCE DAILY IN THE MORNING. 30 tablet 3 01/15/2015 at Unknown time  . warfarin (COUMADIN) 5 MG tablet Take 2.5-5 mg by mouth every evening. Take 1 tablet daily except 1/2 tablet on Mondays and Thursdays   01/15/2015 at Unknown time    Assesment: He was admitted with febrile illness and systemic inflammatory response syndrome. His urine culture is negative. Chest x-ray is negative. Echocardiogram did not show vegetations. Blood cultures are negative now at 4 days but still pending. He has an aortic valve replacement but did not show any vegetations. He feels well. He has been asymptomatic. He is afebrile now. Principal Problem:   SIRS (systemic inflammatory response syndrome) Active Problems:   Essential hypertension, benign   Coronary atherosclerosis of native coronary artery   Pacemaker-Medtronic   Aortic valve replaced    Plan: Discharge home    LOS: 4 days   Douglas [name] L 01/20/2015, 8:27 AM

## 2015-01-20 NOTE — Care Management Utilization Note (Signed)
UR completed 

## 2015-01-20 NOTE — Discharge Summary (Addendum)
Physician Discharge Summary  Patient ID: BARAKA KLATT MRN: [id number] DOB/AGE: [birth year] 81 y.o. Primary Care Physician:FAGAN,ROY, MD Admit date: 01/16/2015 Discharge date: 01/20/2015    Discharge Diagnoses:   Principal Problem:   SIRS (systemic inflammatory response syndrome) Active Problems:   Essential hypertension, benign   Coronary atherosclerosis of native coronary artery   Pacemaker-Medtronic   Aortic valve replaced     Medication List    TAKE these medications        allopurinol 300 MG tablet  Commonly known as:  ZYLOPRIM  Take 450 mg by mouth daily. Take 1 1/2 daily     furosemide 40 MG tablet  Commonly known as:  LASIX  TAKE 1 AND 1/2 TABLETS DAILY.     levofloxacin 500 MG tablet  Commonly known as:  LEVAQUIN  Take 1 tablet (500 mg total) by mouth daily.     metoprolol succinate 25 MG 24 hr tablet  Commonly known as:  TOPROL-XL  TAKE (2) TABLETS BY MOUTH DAILY.     pantoprazole 40 MG tablet  Commonly known as:  PROTONIX  TAKE ONE TABLET BY MOUTH ONCE DAILY IN THE MORNING.     warfarin 5 MG tablet  Commonly known as:  COUMADIN  Take 2.5-5 mg by mouth every evening. Take 1 tablet daily except 1/2 tablet on Mondays and Thursdays        Discharged Condition:improved    Consults: None  Significant Diagnostic Studies: Dg Chest 2 View  01/19/2015   CLINICAL DATA:  .  Weakness.  Prior CABG.  EXAM: CHEST  2 VIEW  COMPARISON:  None.  FINDINGS: Hyperinflation. Prior median sternotomy. Accentuation of expected thoracic kyphosis. Pacer with leads at right atrium and right ventricle. No lead discontinuity.  Patient minimally rotated left on the frontal. Midline trachea. Mild cardiomegaly. Trace bilateral pleural effusions. No pneumothorax. No congestive failure. Mild subsegmental atelectasis or scarring at the left lung base.  IMPRESSION: Trace bilateral pleural effusions with left base atelectasis or scar.  Hyperinflation with mild cardiomegaly.  No congestive  failure.   Electronically Signed   By: Abigail Miyamoto M.D.   On: 01/19/2015 14:45   Dg Chest 2 View  01/16/2015   CLINICAL DATA:  Fever and chills.  EXAM: CHEST  2 VIEW  COMPARISON:  Most recent chest imaging CT 02/11/2013  FINDINGS: Dual lead left-sided pacemaker remains in place. The cardiomediastinal contours are unchanged. Patient is post median sternotomy with prosthetic aortic valve. Mild hyperinflation. Linear opacities at the left lung base, likely atelectasis or scar. Trace blunting of the costophrenic angles may be small effusions versus related to hyperinflation. Pulmonary vasculature is normal. No acute osseous abnormalities, there is degenerative change in the spine.  IMPRESSION: 1. Hyperinflation. Minimal atelectasis or scar at the left lung base, no consolidation to suggest pneumonia. 2. Minimal blunting of the costophrenic angles, small effusions versus related to hyperinflation.   Electronically Signed   By: Jeb Levering M.D.   On: 01/16/2015 20:04    Lab Results: Basic Metabolic Panel:  Recent Labs  01/18/15 0501  NA 134*  K 3.6  CL 103  CO2 23  GLUCOSE 130*  BUN 20  CREATININE 1.04  CALCIUM 8.4   Liver Function Tests:  Recent Labs  01/18/15 0501  AST 37  ALT 33  ALKPHOS 102  BILITOT 0.5  PROT 6.0  ALBUMIN 2.6*     CBC:  Recent Labs  01/18/15 0501  WBC 10.9*  HGB 11.5*  HCT 36.2*  MCV  88.5  PLT 189    Recent Results (from the past 240 hour(s))  Culture, blood (routine x 2)     Status: None (Preliminary result)   Collection Time: 01/16/15  6:57 PM  Result Value Ref Range Status   Specimen Description BLOOD RIGHT HAND  Final   Special Requests BOTTLES DRAWN AEROBIC AND ANAEROBIC 6CC  Final   Culture NO ORGANISMS SEEN  Final   Report Status PENDING  Incomplete  Culture, blood (routine x 2)     Status: None (Preliminary result)   Collection Time: 01/16/15  7:13 PM  Result Value Ref Range Status   Specimen Description LEFT ANTECUBITAL  Final    Special Requests   Final    BOTTLES DRAWN AEROBIC AND ANAEROBIC 6CC DRAWN BY RN   Culture NO ORGANISMS SEEN  Final   Report Status PENDING  Incomplete  Urine culture     Status: None   Collection Time: 01/16/15  9:03 PM  Result Value Ref Range Status   Specimen Description URINE, CLEAN CATCH  Final   Special Requests NONE  Final   Colony Count NO GROWTH Performed at [facility]   Final   Culture NO GROWTH Performed at [facility]   Final   Report Status 01/18/2015 FINAL  Final  MRSA PCR Screening     Status: None   Collection Time: 01/17/15 12:25 AM  Result Value Ref Range Status   MRSA by PCR NEGATIVE NEGATIVE Final    Comment:        The GeneXpert MRSA Assay (FDA approved for NASAL specimens only), is one component of a comprehensive MRSA colonization surveillance program. It is not intended to diagnose MRSA infection nor to guide or monitor treatment for MRSA infections.      Hospital Course: This is an 81 year old who had been in his usual state of fair health at home when he developed high fever. His temperature went as high as 102.5. He had some chills. He had some urinary symptoms with frequency. He also had some cough. His wife had been sick with what was thought to be a viral illness prior to him becoming sick. He was evaluated in the emergency department and no obvious cause of his fever was found. Because he has a bioprosthetic aortic valve he underwent echocardiogram which did not show any evidence of vegetations. Blood cultures were done and although are not fully signed out they are negative at 4 days. Urine culture was negative. Chest x-ray did not show an infiltrate. He was started on IV antibiotics and improved. By the time of discharge he felt well was afebrile and had no symptoms. The cause of his fever is not known.  Discharge Exam: Blood pressure 137/62, pulse 70, temperature 98.2 F (36.8 C), temperature source Oral, resp. rate 21, height  5\' 6"  (1.676 m), weight 103.057 kg (227 lb 3.2 oz), SpO2 97 %. He is awake and alert. He looks comfortable. His chest is clear.  Disposition: Home I discussed his situation with his Coumadin clinic and he will remain on the same dose of Coumadin and have his INR checked on 01/25/2015   because of the uncertain diagnosis he was told to keep an eye on his fever and measured at least twice a day and if he starts having a lot of symptoms he needs to report to the emergency department    Signed: Migdalia Olejniczak L   01/20/2015, 8:43 AM

## 2015-01-26 ENCOUNTER — Ambulatory Visit (INDEPENDENT_AMBULATORY_CARE_PROVIDER_SITE_OTHER): Payer: Medicare Other | Admitting: *Deleted

## 2015-01-26 DIAGNOSIS — I4891 Unspecified atrial fibrillation: Secondary | ICD-10-CM | POA: Diagnosis not present

## 2015-01-26 DIAGNOSIS — Z952 Presence of prosthetic heart valve: Secondary | ICD-10-CM

## 2015-01-26 DIAGNOSIS — Z954 Presence of other heart-valve replacement: Secondary | ICD-10-CM | POA: Diagnosis not present

## 2015-01-26 DIAGNOSIS — Z7901 Long term (current) use of anticoagulants: Secondary | ICD-10-CM

## 2015-01-26 DIAGNOSIS — I480 Paroxysmal atrial fibrillation: Secondary | ICD-10-CM | POA: Diagnosis not present

## 2015-01-26 LAB — POCT INR: INR: 2.4

## 2015-01-27 DIAGNOSIS — I4891 Unspecified atrial fibrillation: Secondary | ICD-10-CM | POA: Diagnosis not present

## 2015-01-27 DIAGNOSIS — R6510 Systemic inflammatory response syndrome (SIRS) of non-infectious origin without acute organ dysfunction: Secondary | ICD-10-CM | POA: Diagnosis not present

## 2015-01-27 LAB — CULTURE, BLOOD (ROUTINE X 2)
CULTURE: NO GROWTH
Culture: NO GROWTH

## 2015-02-07 DIAGNOSIS — L570 Actinic keratosis: Secondary | ICD-10-CM | POA: Diagnosis not present

## 2015-02-07 DIAGNOSIS — Z85828 Personal history of other malignant neoplasm of skin: Secondary | ICD-10-CM | POA: Diagnosis not present

## 2015-02-07 DIAGNOSIS — L820 Inflamed seborrheic keratosis: Secondary | ICD-10-CM | POA: Diagnosis not present

## 2015-02-07 DIAGNOSIS — L821 Other seborrheic keratosis: Secondary | ICD-10-CM | POA: Diagnosis not present

## 2015-02-23 ENCOUNTER — Encounter: Payer: Self-pay | Admitting: Internal Medicine

## 2015-02-23 ENCOUNTER — Ambulatory Visit (INDEPENDENT_AMBULATORY_CARE_PROVIDER_SITE_OTHER): Payer: Medicare Other | Admitting: Internal Medicine

## 2015-02-23 ENCOUNTER — Ambulatory Visit (INDEPENDENT_AMBULATORY_CARE_PROVIDER_SITE_OTHER): Payer: Medicare Other | Admitting: *Deleted

## 2015-02-23 VITALS — BP 122/68 | HR 73 | Ht 66.0 in | Wt 217.5 lb

## 2015-02-23 DIAGNOSIS — Z7901 Long term (current) use of anticoagulants: Secondary | ICD-10-CM | POA: Diagnosis not present

## 2015-02-23 DIAGNOSIS — I480 Paroxysmal atrial fibrillation: Secondary | ICD-10-CM

## 2015-02-23 DIAGNOSIS — Z950 Presence of cardiac pacemaker: Secondary | ICD-10-CM | POA: Diagnosis not present

## 2015-02-23 DIAGNOSIS — I4891 Unspecified atrial fibrillation: Secondary | ICD-10-CM | POA: Diagnosis not present

## 2015-02-23 DIAGNOSIS — I10 Essential (primary) hypertension: Secondary | ICD-10-CM | POA: Diagnosis not present

## 2015-02-23 DIAGNOSIS — I2510 Atherosclerotic heart disease of native coronary artery without angina pectoris: Secondary | ICD-10-CM | POA: Diagnosis not present

## 2015-02-23 DIAGNOSIS — Z952 Presence of prosthetic heart valve: Secondary | ICD-10-CM

## 2015-02-23 DIAGNOSIS — Z954 Presence of other heart-valve replacement: Secondary | ICD-10-CM

## 2015-02-23 LAB — CUP PACEART INCLINIC DEVICE CHECK
Battery Impedance: 232 Ohm
Battery Remaining Longevity: 114 mo
Brady Statistic AP VP Percent: 54 %
Brady Statistic AP VS Percent: 0 %
Brady Statistic AS VP Percent: 46 %
Date Time Interrogation Session: 20160504103136
Lead Channel Impedance Value: 586 Ohm
Lead Channel Pacing Threshold Amplitude: 0.5 V
Lead Channel Pacing Threshold Amplitude: 0.75 V
Lead Channel Pacing Threshold Pulse Width: 0.4 ms
Lead Channel Sensing Intrinsic Amplitude: 1.4 mV
Lead Channel Setting Pacing Amplitude: 2 V
Lead Channel Setting Pacing Amplitude: 2 V
Lead Channel Setting Pacing Pulse Width: 0.4 ms
MDC IDC MSMT BATTERY VOLTAGE: 2.78 V
MDC IDC MSMT LEADCHNL RA IMPEDANCE VALUE: 479 Ohm
MDC IDC MSMT LEADCHNL RV PACING THRESHOLD PULSEWIDTH: 0.4 ms
MDC IDC SET LEADCHNL RV SENSING SENSITIVITY: 4 mV
MDC IDC STAT BRADY AS VS PERCENT: 0 %

## 2015-02-23 LAB — POCT INR: INR: 2

## 2015-02-23 NOTE — Patient Instructions (Addendum)
  Medication Instructions: No change  Labwork: None  Procedures/Testing: None  Follow-Up: Remote monitoring is used to monitor your Pacemaker of ICD from home. This monitoring reduces the number of office visits required to check your device to one time per year. It allows [place] to keep an eye on the functioning of your device to ensure it is working properly. You are scheduled for a device check from home on 05/25/15. You may send your transmission at any time that day. If you have a wireless device, the transmission will be sent automatically. After your physician reviews your transmission, you will receive a postcard with your next transmission date.  Your physician wants you to follow-up in: 1 year with Dr. Lovena Le. You will receive a reminder letter in the mail two months in advance. If you don't receive a letter, please call our office to schedule the follow-up appointment.  Any Additional Special Instructions Will Be Listed Below (If Applicable).  Thank you for choosing [facility] !

## 2015-02-23 NOTE — Assessment & Plan Note (Signed)
His medtronic DDD PM is working normally. Will recheck in several months. 

## 2015-02-23 NOTE — Assessment & Plan Note (Signed)
He is maintaining NSR 99.9% of the time. No change in meds.

## 2015-02-23 NOTE — Assessment & Plan Note (Signed)
His blood pressure is well controlled. No change in meds. He will maintain a low sodium diet. 

## 2015-02-23 NOTE — Progress Notes (Signed)
HPI [name] returns today for followup. He is a pleasant 81 yo man with a h/o CHB, after AVR/Bentall with a tissue valve, s/p PPM, HTN and chronic coumadin therapy. In the interim, he has done well except for a febrile illness several weeks ago where he was in the hospital 4 days.  He denies chest pain or sob. No syncope. Minimal peripheral edema.  No Known Allergies   Current Outpatient Prescriptions  Medication Sig Dispense Refill  . allopurinol (ZYLOPRIM) 300 MG tablet Take 450 mg by mouth daily. Take 1 1/2 daily    . furosemide (LASIX) 40 MG tablet TAKE 1 AND 1/2 TABLETS DAILY. 45 tablet 6  . metoprolol succinate (TOPROL-XL) 25 MG 24 hr tablet TAKE (2) TABLETS BY MOUTH DAILY. 60 tablet 6  . pantoprazole (PROTONIX) 40 MG tablet TAKE ONE TABLET BY MOUTH ONCE DAILY IN THE MORNING. 30 tablet 3  . warfarin (COUMADIN) 5 MG tablet Take 2.5-5 mg by mouth every evening. Take 1 tablet daily except 1/2 tablet on Mondays and Thursdays     No current facility-administered medications for this visit.     Past Medical History  Diagnosis Date  . Essential hypertension, benign   . DJD (degenerative joint disease)   . PMR (polymyalgia rheumatica)   . Gout   . Arthritis   . Aortic stenosis     Bioprosthetic AVR  . Hx of adenomatous colonic polyps   . Aneurysm of thoracic aorta     Ascending. 5.7 x 5.5cm - Bentall procedure  . Coronary atherosclerosis of native coronary artery     Multivessel - LIMA to LAD, SVG to OM, SVG to PDA  . H/O hiatal hernia   . Esophageal dysmotility   . Fibromyalgia   . Complete heart block     Status post pacemaker placement  . Atrial fibrillation     Documented on pacer interrogation 6/13    ROS:   All systems reviewed and negative except as noted in the HPI.   Past Surgical History  Procedure Laterality Date  . Bilateral knee arthroscopy    . Appendectomy    . Hemorroidectomy    . Left inguinal hernia repair    . Hernia repair    . Back  surgery      1965 or 1966 lumbar back surgery  . Bentall procedure  01/24/2012    Procedure: BENTALL PROCEDURE;  Surgeon: Ivin Poot, MD;  Location: [facility];  Service: Open Heart Surgery;  Laterality: N/A;  . Coronary artery bypass graft  01/24/2012    Procedure: CORONARY ARTERY BYPASS GRAFTING (CABG);  Surgeon: Ivin Poot, MD;  Location: [facility];  Service: Open Heart Surgery;  Laterality: N/A;  . Pacemaker placement      Medtronic 4/13 - Dr. Rayann Heman  . Aortic valve replacement  01/24/12  . Esophagogastroduodenoscopy  06/17/2012    Procedure: ESOPHAGOGASTRODUODENOSCOPY (EGD);  Surgeon: Rogene Houston, MD;  [facility];  Service: Endoscopy;  Laterality: N/A;  ED  . Permanent pacemaker insertion N/A 02/01/2012    Procedure: PERMANENT PACEMAKER INSERTION;  Surgeon: Thompson Grayer, MD;  Location: [facility] C[facility];  Service: Cardiovascular;  Laterality: N/A;     Family History  Problem Relation Age of Onset  . Kidney disease Mother   . Heart disease Father   . Anesthesia problems Neg Hx   . Hypotension Neg Hx   . Malignant hyperthermia Neg Hx   . Pseudochol deficiency Neg Hx  History   Social History  . Marital Status: Married    Spouse Name: N/A  . Number of Children: 4  . Years of Education: N/A   Occupational History  . Retired     Scientist, forensic   Social History Main Topics  . Smoking status: Never Smoker   . Smokeless tobacco: Never Used  . Alcohol Use: No  . Drug Use: No  . Sexual Activity: No   Other Topics Concern  . Not on file   Social History Narrative     BP 122/68 mmHg  Pulse 73  Ht 5\' 6"  (1.676 m)  Wt 217 lb 8 oz (98.657 kg)  BMI 35.12 kg/m2  Physical Exam:  Well appearing 81 yo man, NAD HEENT: Unremarkable Neck:  No JVD, no thyromegally Lymphatics:  No adenopathy Back:  No CVA tenderness Lungs:  Clear with no wheezes.  HEART:  Regular rate rhythm, no murmurs, no rubs, no clicks, with no murmur. Abd:  soft, positive bowel sounds, no  organomegally, no rebound, no guarding Ext:  2 plus pulses, no edema, no cyanosis, no clubbing Skin:  No rashes no nodules Neuro:  CN II through XII intact, motor grossly intact   DEVICE  Normal device function.  See PaceArt for details.   Assess/Plan:

## 2015-03-01 ENCOUNTER — Other Ambulatory Visit: Payer: Self-pay | Admitting: Cardiology

## 2015-03-23 ENCOUNTER — Ambulatory Visit (INDEPENDENT_AMBULATORY_CARE_PROVIDER_SITE_OTHER): Payer: Medicare Other | Admitting: *Deleted

## 2015-03-23 DIAGNOSIS — Z7901 Long term (current) use of anticoagulants: Secondary | ICD-10-CM

## 2015-03-23 DIAGNOSIS — Z954 Presence of other heart-valve replacement: Secondary | ICD-10-CM | POA: Diagnosis not present

## 2015-03-23 DIAGNOSIS — Z952 Presence of prosthetic heart valve: Secondary | ICD-10-CM

## 2015-03-23 DIAGNOSIS — I4891 Unspecified atrial fibrillation: Secondary | ICD-10-CM

## 2015-03-23 LAB — POCT INR: INR: 2.3

## 2015-03-29 ENCOUNTER — Other Ambulatory Visit: Payer: Self-pay | Admitting: Cardiology

## 2015-04-18 ENCOUNTER — Other Ambulatory Visit: Payer: Self-pay

## 2015-04-27 ENCOUNTER — Other Ambulatory Visit: Payer: Self-pay | Admitting: Cardiology

## 2015-05-02 DIAGNOSIS — N183 Chronic kidney disease, stage 3 (moderate): Secondary | ICD-10-CM | POA: Diagnosis not present

## 2015-05-02 DIAGNOSIS — I10 Essential (primary) hypertension: Secondary | ICD-10-CM | POA: Diagnosis not present

## 2015-05-02 DIAGNOSIS — Z79899 Other long term (current) drug therapy: Secondary | ICD-10-CM | POA: Diagnosis not present

## 2015-05-04 ENCOUNTER — Ambulatory Visit (INDEPENDENT_AMBULATORY_CARE_PROVIDER_SITE_OTHER): Payer: Medicare Other | Admitting: *Deleted

## 2015-05-04 DIAGNOSIS — Z7901 Long term (current) use of anticoagulants: Secondary | ICD-10-CM | POA: Diagnosis not present

## 2015-05-04 DIAGNOSIS — Z954 Presence of other heart-valve replacement: Secondary | ICD-10-CM | POA: Diagnosis not present

## 2015-05-04 DIAGNOSIS — I4891 Unspecified atrial fibrillation: Secondary | ICD-10-CM | POA: Diagnosis not present

## 2015-05-04 DIAGNOSIS — Z952 Presence of prosthetic heart valve: Secondary | ICD-10-CM

## 2015-05-04 LAB — POCT INR: INR: 1.8

## 2015-05-09 DIAGNOSIS — N183 Chronic kidney disease, stage 3 (moderate): Secondary | ICD-10-CM | POA: Diagnosis not present

## 2015-05-09 DIAGNOSIS — I10 Essential (primary) hypertension: Secondary | ICD-10-CM | POA: Diagnosis not present

## 2015-05-09 DIAGNOSIS — Z6834 Body mass index (BMI) 34.0-34.9, adult: Secondary | ICD-10-CM | POA: Diagnosis not present

## 2015-05-25 ENCOUNTER — Ambulatory Visit (INDEPENDENT_AMBULATORY_CARE_PROVIDER_SITE_OTHER): Payer: Medicare Other | Admitting: *Deleted

## 2015-05-25 DIAGNOSIS — I442 Atrioventricular block, complete: Secondary | ICD-10-CM | POA: Diagnosis not present

## 2015-05-25 NOTE — Progress Notes (Signed)
Remote pacemaker transmission.   

## 2015-05-27 LAB — CUP PACEART REMOTE DEVICE CHECK
Battery Impedance: 232 Ohm
Battery Voltage: 2.78 V
Brady Statistic AP VS Percent: 0 %
Brady Statistic AS VP Percent: 42 %
Lead Channel Impedance Value: 584 Ohm
Lead Channel Pacing Threshold Amplitude: 0.5 V
Lead Channel Pacing Threshold Pulse Width: 0.4 ms
Lead Channel Sensing Intrinsic Amplitude: 0.7 mV
Lead Channel Setting Sensing Sensitivity: 4 mV
MDC IDC MSMT BATTERY REMAINING LONGEVITY: 113 mo
MDC IDC MSMT LEADCHNL RA IMPEDANCE VALUE: 472 Ohm
MDC IDC SESS DTM: 20160803123111
MDC IDC SET LEADCHNL RA PACING AMPLITUDE: 2 V
MDC IDC SET LEADCHNL RV PACING AMPLITUDE: 2 V
MDC IDC SET LEADCHNL RV PACING PULSEWIDTH: 0.4 ms
MDC IDC STAT BRADY AP VP PERCENT: 57 %
MDC IDC STAT BRADY AS VS PERCENT: 0 %

## 2015-05-30 ENCOUNTER — Other Ambulatory Visit: Payer: Self-pay | Admitting: Cardiology

## 2015-06-01 ENCOUNTER — Ambulatory Visit (INDEPENDENT_AMBULATORY_CARE_PROVIDER_SITE_OTHER): Payer: Medicare Other | Admitting: *Deleted

## 2015-06-01 ENCOUNTER — Telehealth: Payer: Self-pay | Admitting: Internal Medicine

## 2015-06-01 DIAGNOSIS — I4891 Unspecified atrial fibrillation: Secondary | ICD-10-CM | POA: Diagnosis not present

## 2015-06-01 DIAGNOSIS — Z954 Presence of other heart-valve replacement: Secondary | ICD-10-CM

## 2015-06-01 DIAGNOSIS — Z7901 Long term (current) use of anticoagulants: Secondary | ICD-10-CM | POA: Diagnosis not present

## 2015-06-01 DIAGNOSIS — Z952 Presence of prosthetic heart valve: Secondary | ICD-10-CM

## 2015-06-01 LAB — POCT INR: INR: 1.9

## 2015-06-01 NOTE — Telephone Encounter (Signed)
Spoke w/pt to let know transmission was received.  

## 2015-06-01 NOTE — Telephone Encounter (Signed)
New message     Patient calling to check on the status of his remote transmitting.

## 2015-06-13 DIAGNOSIS — Z85828 Personal history of other malignant neoplasm of skin: Secondary | ICD-10-CM | POA: Diagnosis not present

## 2015-06-13 DIAGNOSIS — C44311 Basal cell carcinoma of skin of nose: Secondary | ICD-10-CM | POA: Diagnosis not present

## 2015-06-23 ENCOUNTER — Encounter: Payer: Self-pay | Admitting: Cardiology

## 2015-06-28 ENCOUNTER — Other Ambulatory Visit: Payer: Self-pay | Admitting: Cardiology

## 2015-06-29 ENCOUNTER — Ambulatory Visit (INDEPENDENT_AMBULATORY_CARE_PROVIDER_SITE_OTHER): Payer: Medicare Other | Admitting: *Deleted

## 2015-06-29 DIAGNOSIS — Z7901 Long term (current) use of anticoagulants: Secondary | ICD-10-CM

## 2015-06-29 DIAGNOSIS — I4891 Unspecified atrial fibrillation: Secondary | ICD-10-CM | POA: Diagnosis not present

## 2015-06-29 DIAGNOSIS — Z952 Presence of prosthetic heart valve: Secondary | ICD-10-CM

## 2015-06-29 DIAGNOSIS — Z954 Presence of other heart-valve replacement: Secondary | ICD-10-CM

## 2015-06-29 LAB — POCT INR: INR: 2.8

## 2015-07-04 ENCOUNTER — Encounter: Payer: Self-pay | Admitting: Internal Medicine

## 2015-07-07 ENCOUNTER — Encounter: Payer: Self-pay | Admitting: Cardiology

## 2015-07-18 DIAGNOSIS — C44311 Basal cell carcinoma of skin of nose: Secondary | ICD-10-CM | POA: Diagnosis not present

## 2015-07-18 DIAGNOSIS — Z85828 Personal history of other malignant neoplasm of skin: Secondary | ICD-10-CM | POA: Diagnosis not present

## 2015-07-25 DIAGNOSIS — Z4802 Encounter for removal of sutures: Secondary | ICD-10-CM | POA: Diagnosis not present

## 2015-07-27 ENCOUNTER — Ambulatory Visit (INDEPENDENT_AMBULATORY_CARE_PROVIDER_SITE_OTHER): Payer: Medicare Other | Admitting: *Deleted

## 2015-07-27 DIAGNOSIS — Z7901 Long term (current) use of anticoagulants: Secondary | ICD-10-CM

## 2015-07-27 DIAGNOSIS — Z952 Presence of prosthetic heart valve: Secondary | ICD-10-CM

## 2015-07-27 DIAGNOSIS — Z954 Presence of other heart-valve replacement: Secondary | ICD-10-CM

## 2015-07-27 DIAGNOSIS — I4891 Unspecified atrial fibrillation: Secondary | ICD-10-CM | POA: Diagnosis not present

## 2015-07-27 LAB — POCT INR: INR: 2.6

## 2015-08-16 DIAGNOSIS — H52223 Regular astigmatism, bilateral: Secondary | ICD-10-CM | POA: Diagnosis not present

## 2015-08-16 DIAGNOSIS — H524 Presbyopia: Secondary | ICD-10-CM | POA: Diagnosis not present

## 2015-08-16 DIAGNOSIS — H5203 Hypermetropia, bilateral: Secondary | ICD-10-CM | POA: Diagnosis not present

## 2015-08-16 DIAGNOSIS — H35033 Hypertensive retinopathy, bilateral: Secondary | ICD-10-CM | POA: Diagnosis not present

## 2015-08-24 ENCOUNTER — Ambulatory Visit (INDEPENDENT_AMBULATORY_CARE_PROVIDER_SITE_OTHER): Payer: Medicare Other | Admitting: *Deleted

## 2015-08-24 DIAGNOSIS — Z7901 Long term (current) use of anticoagulants: Secondary | ICD-10-CM | POA: Diagnosis not present

## 2015-08-24 DIAGNOSIS — Z954 Presence of other heart-valve replacement: Secondary | ICD-10-CM | POA: Diagnosis not present

## 2015-08-24 DIAGNOSIS — I4891 Unspecified atrial fibrillation: Secondary | ICD-10-CM

## 2015-08-24 DIAGNOSIS — Z952 Presence of prosthetic heart valve: Secondary | ICD-10-CM

## 2015-08-24 LAB — POCT INR: INR: 2.6

## 2015-08-29 ENCOUNTER — Ambulatory Visit: Payer: Medicare Other | Admitting: *Deleted

## 2015-08-29 DIAGNOSIS — I442 Atrioventricular block, complete: Secondary | ICD-10-CM | POA: Diagnosis not present

## 2015-08-29 NOTE — Progress Notes (Addendum)
Error -- pt did not send remote transmission.

## 2015-08-30 ENCOUNTER — Encounter: Payer: Self-pay | Admitting: Cardiology

## 2015-09-05 DIAGNOSIS — I2510 Atherosclerotic heart disease of native coronary artery without angina pectoris: Secondary | ICD-10-CM | POA: Diagnosis not present

## 2015-09-05 DIAGNOSIS — Z79899 Other long term (current) drug therapy: Secondary | ICD-10-CM | POA: Diagnosis not present

## 2015-09-05 DIAGNOSIS — N189 Chronic kidney disease, unspecified: Secondary | ICD-10-CM | POA: Diagnosis not present

## 2015-09-06 ENCOUNTER — Ambulatory Visit (INDEPENDENT_AMBULATORY_CARE_PROVIDER_SITE_OTHER): Payer: Medicare Other | Admitting: *Deleted

## 2015-09-06 DIAGNOSIS — I442 Atrioventricular block, complete: Secondary | ICD-10-CM

## 2015-09-07 NOTE — Progress Notes (Signed)
Remote pacemaker transmission.   

## 2015-09-13 DIAGNOSIS — I2510 Atherosclerotic heart disease of native coronary artery without angina pectoris: Secondary | ICD-10-CM | POA: Diagnosis not present

## 2015-09-13 DIAGNOSIS — Z6834 Body mass index (BMI) 34.0-34.9, adult: Secondary | ICD-10-CM | POA: Diagnosis not present

## 2015-09-13 DIAGNOSIS — N183 Chronic kidney disease, stage 3 (moderate): Secondary | ICD-10-CM | POA: Diagnosis not present

## 2015-09-13 DIAGNOSIS — Z23 Encounter for immunization: Secondary | ICD-10-CM | POA: Diagnosis not present

## 2015-09-13 DIAGNOSIS — E785 Hyperlipidemia, unspecified: Secondary | ICD-10-CM | POA: Diagnosis not present

## 2015-09-14 LAB — CUP PACEART REMOTE DEVICE CHECK
Battery Remaining Longevity: 108 mo
Brady Statistic AP VS Percent: 0 %
Brady Statistic AS VP Percent: 29 %
Brady Statistic AS VS Percent: 0 %
Implantable Lead Implant Date: 20130412
Implantable Lead Location: 753859
Implantable Lead Model: 5076
Lead Channel Impedance Value: 493 Ohm
Lead Channel Setting Pacing Amplitude: 2 V
Lead Channel Setting Pacing Amplitude: 2 V
Lead Channel Setting Pacing Pulse Width: 0.4 ms
MDC IDC LEAD IMPLANT DT: 20130412
MDC IDC LEAD LOCATION: 753860
MDC IDC MSMT BATTERY IMPEDANCE: 256 Ohm
MDC IDC MSMT BATTERY VOLTAGE: 2.78 V
MDC IDC MSMT LEADCHNL RV IMPEDANCE VALUE: 573 Ohm
MDC IDC SESS DTM: 20161115182845
MDC IDC SET LEADCHNL RV SENSING SENSITIVITY: 4 mV
MDC IDC STAT BRADY AP VP PERCENT: 71 %

## 2015-09-21 ENCOUNTER — Encounter: Payer: Self-pay | Admitting: Cardiology

## 2015-10-05 ENCOUNTER — Encounter: Payer: Self-pay | Admitting: Cardiology

## 2015-10-05 ENCOUNTER — Ambulatory Visit (INDEPENDENT_AMBULATORY_CARE_PROVIDER_SITE_OTHER): Payer: Medicare Other | Admitting: *Deleted

## 2015-10-05 DIAGNOSIS — I4891 Unspecified atrial fibrillation: Secondary | ICD-10-CM | POA: Diagnosis not present

## 2015-10-05 DIAGNOSIS — Z954 Presence of other heart-valve replacement: Secondary | ICD-10-CM

## 2015-10-05 DIAGNOSIS — Z7901 Long term (current) use of anticoagulants: Secondary | ICD-10-CM | POA: Diagnosis not present

## 2015-10-05 DIAGNOSIS — Z952 Presence of prosthetic heart valve: Secondary | ICD-10-CM

## 2015-10-05 LAB — POCT INR: INR: 2.5

## 2015-10-28 ENCOUNTER — Encounter: Payer: Self-pay | Admitting: *Deleted

## 2015-10-28 ENCOUNTER — Ambulatory Visit (INDEPENDENT_AMBULATORY_CARE_PROVIDER_SITE_OTHER): Payer: Medicare Other | Admitting: Physician Assistant

## 2015-10-28 ENCOUNTER — Encounter: Payer: Self-pay | Admitting: Physician Assistant

## 2015-10-28 VITALS — BP 118/62 | HR 72 | Ht 66.0 in | Wt 213.8 lb

## 2015-10-28 DIAGNOSIS — I10 Essential (primary) hypertension: Secondary | ICD-10-CM | POA: Diagnosis not present

## 2015-10-28 DIAGNOSIS — Z952 Presence of prosthetic heart valve: Secondary | ICD-10-CM

## 2015-10-28 DIAGNOSIS — Z7901 Long term (current) use of anticoagulants: Secondary | ICD-10-CM

## 2015-10-28 DIAGNOSIS — R079 Chest pain, unspecified: Secondary | ICD-10-CM | POA: Diagnosis not present

## 2015-10-28 DIAGNOSIS — Z954 Presence of other heart-valve replacement: Secondary | ICD-10-CM

## 2015-10-28 DIAGNOSIS — R072 Precordial pain: Secondary | ICD-10-CM | POA: Diagnosis not present

## 2015-10-28 DIAGNOSIS — I480 Paroxysmal atrial fibrillation: Secondary | ICD-10-CM | POA: Diagnosis not present

## 2015-10-28 NOTE — Progress Notes (Signed)
Cardiology Office Note   Date:  10/28/2015   ID:  AZARIYAH RAMUS, DOB [birth year], MRN [id number]  PCP:  [name], MD  Cardiologist:  Dr Domenic Polite, Dr Brayton Mars, PA-C   History of Present Illness: Douglas [name] is a 82 y.o. male with a history of AVR/Bentall procedure, CABG 2013, MDT PPM for CHB, PMR, PAF (seen on PPM interrogation) on coumadin.   Laurena Slimmer presents for evaluation of chest pain.  Mr Rodarte generally does very well. He has no LE edema. No palpitations. No new DOE, no orthopnea or PND. He admits he cannot do what he used to do, but he still is active around the house and [place] a [place] the farm.   Recently (exact timing unclear) he has noticed upper chest tightness with exertion. It is a 2-3/10. He denies associated symptoms. There is no dyspnea, N&V or diaphoresis. When he stops, the chest tightness goes away. He is not sure how many episodes there have been. The most recent one (the only one he can describe) was when he went to get wood for the stove.  He cannot remember any chest pain prior to his CABG/AVR. He thinks the cath that led to his CABG was a screening procedure because he needed a new valve.  Past Medical History  Diagnosis Date  . Essential hypertension, benign   . DJD (degenerative joint disease)   . PMR (polymyalgia rheumatica) (HCC)   . Gout   . Arthritis   . Aortic stenosis     Bioprosthetic AVR  . Hx of adenomatous colonic polyps   . Aneurysm of thoracic aorta (HCC)     Ascending. 5.7 x 5.5cm - Bentall procedure  . Coronary atherosclerosis of native coronary artery     Multivessel - LIMA to LAD, SVG to OM, SVG to PDA  . H/O hiatal hernia   . Esophageal dysmotility   . Fibromyalgia   . Complete heart block ([place])     Status post pacemaker placement  . Atrial fibrillation ([place])     Documented on pacer interrogation 6/13    Past Surgical History  Procedure Laterality Date  . Bilateral knee arthroscopy    .  Appendectomy    . Hemorroidectomy    . Left inguinal hernia repair    . Hernia repair    . Back surgery      1965 or 1966 lumbar back surgery  . Bentall procedure  01/24/2012    Procedure: BENTALL PROCEDURE;  Surgeon: Ivin Poot, MD;  Location: [facility];  Service: Open Heart Surgery;  Laterality: N/A;  . Coronary artery bypass graft  01/24/2012    Procedure: CORONARY ARTERY BYPASS GRAFTING (CABG);  Surgeon: Ivin Poot, MD;  Location: [facility];  Service: Open Heart Surgery;  Laterality: N/A;  . Pacemaker placement      Medtronic 4/13 - Dr. Rayann Heman  . Aortic valve replacement  01/24/12  . Esophagogastroduodenoscopy  06/17/2012    Procedure: ESOPHAGOGASTRODUODENOSCOPY (EGD);  Surgeon: Rogene Houston, MD;  [facility];  Service: Endoscopy;  Laterality: N/A;  ED  . Permanent pacemaker insertion N/A 02/01/2012    Procedure: PERMANENT PACEMAKER INSERTION;  Surgeon: Thompson Grayer, MD; [place] 1 (serial number [id number])      Current Outpatient Prescriptions  Medication Sig Dispense Refill  . allopurinol (ZYLOPRIM) 300 MG tablet Take 450 mg by mouth daily. Take 1 1/2 daily    . furosemide (LASIX)  40 MG tablet TAKE 1 AND 1/2 TABLETS DAILY. 45 tablet 9  . metoprolol succinate (TOPROL-XL) 25 MG 24 hr tablet TAKE (2) TABLETS BY MOUTH DAILY. 60 tablet 9  . pantoprazole (PROTONIX) 40 MG tablet TAKE ONE TABLET BY MOUTH ONCE DAILY IN THE MORNING. 30 tablet 3  . warfarin (COUMADIN) 5 MG tablet Take 1 tablet (5 mg total) by mouth one time only at 6 PM. 90 tablet 3  . pravastatin (PRAVACHOL) 20 MG tablet Take 20 mg by mouth daily.   11   No current facility-administered medications for this visit.    Allergies:   Review of patient's allergies indicates no known allergies.    Social History:  The patient  reports that he has never smoked. He has never used smokeless tobacco. He reports that he does not drink alcohol or use illicit drugs.   Family History:  The patient's  family history includes Heart disease in his father; Kidney disease in his mother. There is no history of Anesthesia problems, Hypotension, Malignant hyperthermia, or Pseudochol deficiency.    ROS:  Please see the history of present illness. All other systems are reviewed and negative.    PHYSICAL EXAM: VS:  BP 118/62 mmHg  Pulse 72  Ht 5\' 6"  (1.676 m)  Wt 213 lb 12.8 oz (96.979 kg)  BMI 34.52 kg/m2  SpO2 99% , BMI Body mass index is 34.52 kg/(m^2). GEN: Well nourished, well developed, male in no acute distress HEENT: normal for age  Neck: no JVD, no carotid bruit, no masses Cardiac: RRR; no murmur, no rubs, or gallops Respiratory:  clear to auscultation bilaterally, normal work of breathing GI: soft, nontender, nondistended, + BS MS: no deformity or atrophy; no edema; distal pulses are 2+ in all 4 extremities  Skin: warm and dry, no rash Neuro:  Strength and sensation are intact Psych: euthymic mood, full affect   EKG:  EKG is ordered today. The ekg ordered today demonstrates AV pacing   Recent Labs: 01/18/2015: ALT 33; BUN 20; Creatinine, Ser 1.04; Hemoglobin 11.5*; Platelets 189; Potassium 3.6; Sodium 134*    Lipid Panel No results found for: CHOL, TRIG, HDL, CHOLHDL, VLDL, LDLCALC, LDLDIRECT   Wt Readings from Last 3 Encounters:  10/28/15 213 lb 12.8 oz (96.979 kg)  02/23/15 217 lb 8 oz (98.657 kg)  01/20/15 227 lb 3.2 oz (103.057 kg)     Other studies Reviewed: Additional studies/ records that were reviewed today include: Hospital records, office notes.  ASSESSMENT AND PLAN:  1.  Chest pain: symptoms concerning for angina because they are brought on by exertion and relieved by rest. Stress test 06/2014 showed EF 20%, but no ischemia or infarction. EF 60-65% by echo 12/2014. Will go ahead and schedule a MV, but if EF is lower w/ Lexiscan, may need to consider global ischemia. Continue BB and statin (lipids managed by Dr Willey Blade), no ASA 2nd coumadin.  2. Chronic  systolic CHF: no sx volume overload by exam, wt below baseline. Continue current Lasix dose.   3. Chronic anticoagulation: coumadin, followed by coumadin clinic here, keep appt. This patients CHA2DS2-VASc Score and unadjusted Ischemic Stroke Rate (% per year) is equal to 7.2 % stroke rate/year from a score of 5 Above score calculated as 1 point each if present [CHF, HTN, DM, Vascular=MI/PAD/Aortic Plaque, Age if 65-74, or Male], 2 points each if present [Age > 75, or Stroke/TIA/TE]     4. PAF/PPM/CHB: MDT PPM in place. Pt keeps appts for remote checks. No  sx so not known if he is having atrial fib except at interrogation. Continue BB at current dose  Current medicines are reviewed at length with the patient today.  The patient does not have concerns regarding medicines.  The following changes have been made:  no change  Labs/ tests ordered today include:  Orders Placed This Encounter  Procedures  . NM Myocar Multi W/Spect W/Wall Motion / EF  . EKG 12-Lead     Disposition:   FU with Dr Domenic Polite  Signed, Rosaria Ferries, PA-C  10/28/2015 3:00 PM    [address], [place], [place]  [address] Phone: [phone]; Fax: [phone]

## 2015-10-28 NOTE — Patient Instructions (Signed)
Your physician recommends that you schedule a follow-up appointment After Stress Test  Your physician recommends that you continue on your current medications as directed. Please refer to the Current Medication list given to you today.  Your physician has requested that you have a lexiscan myoview. For further information please visit [email or web address]. Please follow instruction sheet, as given.  If you need a refill on your cardiac medications before your next appointment, please call your pharmacy.  Thank you for choosing [facility]!

## 2015-11-07 ENCOUNTER — Encounter (HOSPITAL_COMMUNITY)
Admission: RE | Admit: 2015-11-07 | Discharge: 2015-11-07 | Disposition: A | Discharge status: Home / Self Care | Payer: Medicare Other | Source: Ambulatory Visit | Attending: Physician Assistant | Admitting: Physician Assistant

## 2015-11-07 ENCOUNTER — Encounter (HOSPITAL_COMMUNITY): Payer: Self-pay

## 2015-11-07 ENCOUNTER — Encounter (HOSPITAL_COMMUNITY): Payer: Medicare Other

## 2015-11-07 DIAGNOSIS — R079 Chest pain, unspecified: Secondary | ICD-10-CM | POA: Diagnosis not present

## 2015-11-07 DIAGNOSIS — I259 Chronic ischemic heart disease, unspecified: Secondary | ICD-10-CM | POA: Diagnosis not present

## 2015-11-07 DIAGNOSIS — I5189 Other ill-defined heart diseases: Secondary | ICD-10-CM | POA: Diagnosis not present

## 2015-11-07 DIAGNOSIS — I252 Old myocardial infarction: Secondary | ICD-10-CM | POA: Diagnosis not present

## 2015-11-07 LAB — NM MYOCAR MULTI W/SPECT W/WALL MOTION / EF
CHL CUP NUCLEAR SRS: 0
CHL CUP NUCLEAR SSS: 6
CSEPPHR: 87 {beats}/min
LHR: 0.4
LV dias vol: 75 mL
LV sys vol: 55 mL
Rest HR: 87 {beats}/min
SDS: 6
TID: 1.12

## 2015-11-07 MED ORDER — TECHNETIUM TC 99M SESTAMIBI - CARDIOLITE
10.0000 | Freq: Once | INTRAVENOUS | Status: AC | PRN
  Administered 2015-11-07: 11:00:00 10.2 via INTRAVENOUS

## 2015-11-07 MED ORDER — REGADENOSON 0.4 MG/5ML IV SOLN
INTRAVENOUS | Status: AC
Start: 2015-11-07 — End: 2015-11-07
  Administered 2015-11-07: 0.4 mg
  Filled 2015-11-07: qty 5

## 2015-11-07 MED ORDER — SODIUM CHLORIDE 0.9 % IJ SOLN
INTRAMUSCULAR | Status: AC
Start: 2015-11-07 — End: 2015-11-07
  Filled 2015-11-07: qty 3

## 2015-11-07 MED ORDER — TECHNETIUM TC 99M SESTAMIBI GENERIC - CARDIOLITE
30.0000 | Freq: Once | INTRAVENOUS | Status: AC | PRN
  Administered 2015-11-07: 32 via INTRAVENOUS

## 2015-11-08 ENCOUNTER — Telehealth: Payer: Self-pay

## 2015-11-08 DIAGNOSIS — R072 Precordial pain: Secondary | ICD-10-CM

## 2015-11-08 DIAGNOSIS — R931 Abnormal findings on diagnostic imaging of heart and coronary circulation: Secondary | ICD-10-CM

## 2015-11-08 MED ORDER — ISOSORBIDE MONONITRATE ER 30 MG PO TB24: 30.0000 mg | ORAL_TABLET | Freq: Every day | ORAL | Status: DC

## 2015-11-08 NOTE — Telephone Encounter (Signed)
Placed order for echo,escribed Imdur,LM for pt to call back, needs to see Dr Domenic Polite after echo per Suanne Marker  Barrett PA-C  Lupita Shutter reception aware

## 2015-11-09 NOTE — Telephone Encounter (Signed)
Patient notified of appts.  

## 2015-11-10 ENCOUNTER — Ambulatory Visit (HOSPITAL_COMMUNITY)
Admission: RE | Admit: 2015-11-10 | Discharge: 2015-11-10 | Disposition: A | Discharge status: Home / Self Care | Payer: Medicare Other | Source: Ambulatory Visit | Attending: Cardiology | Admitting: Cardiology

## 2015-11-10 DIAGNOSIS — R072 Precordial pain: Secondary | ICD-10-CM | POA: Insufficient documentation

## 2015-11-10 DIAGNOSIS — I358 Other nonrheumatic aortic valve disorders: Secondary | ICD-10-CM | POA: Diagnosis not present

## 2015-11-10 DIAGNOSIS — R931 Abnormal findings on diagnostic imaging of heart and coronary circulation: Secondary | ICD-10-CM

## 2015-11-10 DIAGNOSIS — I081 Rheumatic disorders of both mitral and tricuspid valves: Secondary | ICD-10-CM | POA: Insufficient documentation

## 2015-11-10 DIAGNOSIS — I4891 Unspecified atrial fibrillation: Secondary | ICD-10-CM | POA: Insufficient documentation

## 2015-11-10 MED ORDER — PERFLUTREN LIPID MICROSPHERE
1.0000 mL | INTRAVENOUS | Status: AC | PRN
  Administered 2015-11-10: 1 mL via INTRAVENOUS
  Administered 2015-11-10: 2 mL via INTRAVENOUS

## 2015-11-10 NOTE — Progress Notes (Signed)
IV to [facility] out with catheter intact.

## 2015-11-11 ENCOUNTER — Ambulatory Visit (INDEPENDENT_AMBULATORY_CARE_PROVIDER_SITE_OTHER): Payer: Medicare Other | Admitting: Cardiology

## 2015-11-11 ENCOUNTER — Encounter: Payer: Self-pay | Admitting: Cardiology

## 2015-11-11 ENCOUNTER — Other Ambulatory Visit (HOSPITAL_COMMUNITY): Payer: Medicare Other

## 2015-11-11 VITALS — BP 132/78 | HR 81 | Ht 67.0 in | Wt 214.0 lb

## 2015-11-11 DIAGNOSIS — Z953 Presence of xenogenic heart valve: Secondary | ICD-10-CM

## 2015-11-11 DIAGNOSIS — Z954 Presence of other heart-valve replacement: Secondary | ICD-10-CM | POA: Diagnosis not present

## 2015-11-11 DIAGNOSIS — I25119 Atherosclerotic heart disease of native coronary artery with unspecified angina pectoris: Secondary | ICD-10-CM

## 2015-11-11 DIAGNOSIS — R072 Precordial pain: Secondary | ICD-10-CM

## 2015-11-11 NOTE — Patient Instructions (Addendum)
Your physician recommends that you schedule a follow-up appointment in: 3 months   Your physician recommends that you continue on your current medications as directed. Please refer to the Current Medication list given to you today.    If you need a refill on your cardiac medications before your next appointment, please call your pharmacy.     Thank you for choosing [facility] !         

## 2015-11-11 NOTE — Progress Notes (Signed)
Cardiology Office Note  Date: 11/11/2015   ID: Douglas [name], DOB 08/23/1928, MRN [id number]  PCP: Douglas Noble, MD  Primary Cardiologist: Douglas Lesches, MD   Chief Complaint  Patient presents with  . Follow-up testing    History of Present Illness: Douglas [name] is a medically complex 82 y.o. male seen recently by Ms. Barrett PA-C on January 6. He was referred for follow-up cardiac testing based on recurring episodes of chest discomfort. He explains to me that he has felt a "tightness" at times in his upper chest, not specifically with exertion. Most recently the symptoms have been notable after eating.  Recent cardiac testing is detailed below. He underwent a Lexiscan Cardiolite which demonstrated a moderate-sized inferior wall defect with mild peri-infarct ischemia, and LVEF less than 30%. Fortunately, echocardiogram revealed LVEF 55-60%. Aortic prosthesis was also functioning normally. Discuss the results of the study today. There is certainly possible that he has graft disease explaining the area of inferior scar with ischemia, but did not have a large ischemic burden.  We reviewed his medications which are outlined below. He has not yet picked up Imdur 30 mg daily which we decided to start next to see if it helps with his possible angina symptoms. Otherwise he continues on Toprol-XL, Pravachol, and Coumadin.  Past Medical History  Diagnosis Date  . Essential hypertension, benign   . DJD (degenerative joint disease)   . PMR (polymyalgia rheumatica) (HCC)   . Gout   . Arthritis   . Aortic stenosis     Bioprosthetic AVR  . Hx of adenomatous colonic polyps   . Aneurysm of thoracic aorta (HCC)     Ascending. 5.7 x 5.5cm - Bentall procedure  . Coronary atherosclerosis of native coronary artery     Multivessel - LIMA to LAD, SVG to OM, SVG to PDA  . H/O hiatal hernia   . Esophageal dysmotility   . Fibromyalgia   . Complete heart block ([place])     Status post pacemaker placement    . Atrial fibrillation ([place])     Documented on pacer interrogation 6/13  . History of skin cancer     Skin    Current Outpatient Prescriptions  Medication Sig Dispense Refill  . allopurinol (ZYLOPRIM) 300 MG tablet Take 450 mg by mouth daily. Take 1 1/2 daily    . furosemide (LASIX) 40 MG tablet TAKE 1 AND 1/2 TABLETS DAILY. 45 tablet 9  . isosorbide mononitrate (IMDUR) 30 MG 24 hr tablet Take 1 tablet (30 mg total) by mouth daily. 30 tablet 6  . metoprolol succinate (TOPROL-XL) 25 MG 24 hr tablet TAKE (2) TABLETS BY MOUTH DAILY. 60 tablet 9  . pantoprazole (PROTONIX) 40 MG tablet TAKE ONE TABLET BY MOUTH ONCE DAILY IN THE MORNING. 30 tablet 3  . pravastatin (PRAVACHOL) 20 MG tablet Take 20 mg by mouth daily.   11  . warfarin (COUMADIN) 5 MG tablet Take 1 tablet (5 mg total) by mouth one time only at 6 PM. 90 tablet 3   No current facility-administered medications for this visit.   Allergies:  Review of patient's allergies indicates no known allergies.   Social History: The patient  reports that he has never smoked. He has never used smokeless tobacco. He reports that he does not drink alcohol or use illicit drugs.   ROS:  Please see the history of present illness. Otherwise, complete review of systems is positive for occasional indigestion, NYHA class II dyspnea.  All other  systems are reviewed and negative.   Physical Exam: VS:  BP 132/78 mmHg  Pulse 81  Ht 5\' 7"  (1.702 m)  Wt 214 lb (97.07 kg)  BMI 33.51 kg/m2  SpO2 97%, BMI Body mass index is 33.51 kg/(m^2).  Wt Readings from Last 3 Encounters:  11/11/15 214 lb (97.07 kg)  10/28/15 213 lb 12.8 oz (96.979 kg)  02/23/15 217 lb 8 oz (98.657 kg)    General: Overweight elderly male, appears comfortable at rest. HEENT: Conjunctiva and lids normal, oropharynx clear.  Neck: Supple, increased girth, no carotid bruits, no thyromegaly.  Thorax: Stable pacer pocket site. Well healing midline sternal incision.  Lungs: Decreased  breath sounds, nonlabored breathing at rest.  Cardiac: RRR, no S3, 2/6 basal systolic murmur, no pericardial rub.  Abdomen: Soft, nontender, bowel sounds present, no guarding or rebound.  Extremities: 1+ edema.   ECG: ECG is not ordered today.  Recent Labwork: 01/18/2015: ALT 33; AST 37; BUN 20; Creatinine, Ser 1.04; Hemoglobin 11.5*; Platelets 189; Potassium 3.6; Sodium 134*   Other Studies Reviewed Today:  Lexiscan Cardiolite 11/07/2015:  There was no ST segment deviation noted during stress.  Findings consistent with prior myocardial infarction with peri-infarct ischemia. There is a moderate sized inferior wall scar with mild peri-infarct ischemia.  The left ventricular ejection fraction is severely decreased (<30%).  This is a high risk study. High risk primarily based on low ejection fraction, there is fairly small amount of myocardium currently at jeopardy. Consider echocardiogram to better evaluate LVEF.  Echocardiogram 11/10/2015: Study Conclusions  - Left ventricle: The cavity size was normal. Wall thickness was increased in a pattern of moderate LVH. Systolic function was normal. The estimated ejection fraction was in the range of 55% to 60%. Wall motion was normal; there were no regional wall motion abnormalities. Features are consistent with a pseudonormal left ventricular filling pattern, with concomitant abnormal relaxation and increased filling pressure (grade 2 diastolic dysfunction). Doppler parameters are consistent with high ventricular filling pressure. - Aortic valve: History of Bental procedure with 23 mm Edwards pericardial valve along with 42mm Gelweave Valsalva ascending aorta graft. Leaflets poorly visualized, there does appear to be decreased leaflet mobility however there is no significant gradient across the valve. Mildly calcified annulus. Mildly thickened leaflets. Mean gradient (S): 5 mm Hg. Peak gradient (S): 10 mm  Hg. VTI ratio of LVOT to aortic valve: 0.76. Valve area (VTI): 1.94 cm^2. Valve area (Vmax): 1.89 cm^2. Valve area (Vmean): 1.96 cm^2. - Aorta: Patient with history of Bentall procedure with 28 mm Gelweave Valsalva graft. - Aortic root: The aortic root was normal in size. - Mitral valve: Mildly calcified annulus. Mildly thickened leaflets . There was mild regurgitation. - Right atrium: The atrium was mildly dilated. - Atrial septum: No defect or patent foramen ovale was identified. - Technically difficult study. Echocontrast was used to enhance visualization.  Assessment and Plan:  1. Recent episodes of chest discomfort, not entirely typical for angina but certainly possible. Recent stress testing shows an area of inferior scar with mild peri-infarct ischemia, LVEF normal range by echocardiogram. Plan is to initiate Imdur 30 mg daily to see if this is helpful in terms of symptom suppression. Continue medical therapy and observation for now.  2. Multivessel CAD status post CABG. Could have graft disease based on stress testing.  3. Status post Bentall procedure with bioprosthetic AVR. Aortic valve function stable by recent echocardiogram.  Current medicines were reviewed with the patient today.  Disposition: FU with me  in 3 months.   Signed, Satira Sark, MD, [facility] 11/11/2015 8:37 AM    [facility]. [address], [place], [place] [address] Phone: [phone]; Fax: [phone]

## 2015-11-16 ENCOUNTER — Ambulatory Visit (INDEPENDENT_AMBULATORY_CARE_PROVIDER_SITE_OTHER): Payer: Medicare Other | Admitting: *Deleted

## 2015-11-16 DIAGNOSIS — I4891 Unspecified atrial fibrillation: Secondary | ICD-10-CM

## 2015-11-16 DIAGNOSIS — Z954 Presence of other heart-valve replacement: Secondary | ICD-10-CM | POA: Diagnosis not present

## 2015-11-16 DIAGNOSIS — Z7901 Long term (current) use of anticoagulants: Secondary | ICD-10-CM | POA: Diagnosis not present

## 2015-11-16 DIAGNOSIS — Z952 Presence of prosthetic heart valve: Secondary | ICD-10-CM

## 2015-11-16 LAB — POCT INR: INR: 2.7

## 2015-11-24 ENCOUNTER — Other Ambulatory Visit: Payer: Self-pay | Admitting: Cardiology

## 2015-12-06 ENCOUNTER — Ambulatory Visit (INDEPENDENT_AMBULATORY_CARE_PROVIDER_SITE_OTHER): Payer: Medicare Other | Admitting: *Deleted

## 2015-12-06 ENCOUNTER — Telehealth: Payer: Self-pay | Admitting: Internal Medicine

## 2015-12-06 DIAGNOSIS — I442 Atrioventricular block, complete: Secondary | ICD-10-CM

## 2015-12-06 NOTE — Progress Notes (Signed)
Remote pacemaker transmission.   

## 2015-12-06 NOTE — Telephone Encounter (Signed)
New message      Calling to get help with his remote transmission.

## 2015-12-06 NOTE — Telephone Encounter (Signed)
Scheduler informed pt that remote transmission was received.

## 2015-12-07 ENCOUNTER — Encounter: Payer: Self-pay | Admitting: Cardiology

## 2015-12-07 LAB — CUP PACEART REMOTE DEVICE CHECK
Battery Voltage: 2.78 V
Brady Statistic AP VP Percent: 72 %
Brady Statistic AP VS Percent: 0 %
Brady Statistic AS VP Percent: 28 %
Implantable Lead Implant Date: 20130412
Implantable Lead Location: 753859
Implantable Lead Location: 753860
Implantable Lead Model: 5076
Lead Channel Impedance Value: 473 Ohm
Lead Channel Impedance Value: 558 Ohm
Lead Channel Pacing Threshold Amplitude: 0.5 V
Lead Channel Pacing Threshold Pulse Width: 0.4 ms
Lead Channel Sensing Intrinsic Amplitude: 0.7 mV
Lead Channel Setting Pacing Amplitude: 2 V
Lead Channel Setting Pacing Amplitude: 2 V
MDC IDC LEAD IMPLANT DT: 20130412
MDC IDC MSMT BATTERY IMPEDANCE: 256 Ohm
MDC IDC MSMT BATTERY REMAINING LONGEVITY: 106 mo
MDC IDC SESS DTM: 20170214173128
MDC IDC SET LEADCHNL RV PACING PULSEWIDTH: 0.4 ms
MDC IDC SET LEADCHNL RV SENSING SENSITIVITY: 4 mV
MDC IDC STAT BRADY AS VS PERCENT: 0 %

## 2015-12-21 ENCOUNTER — Encounter: Payer: Self-pay | Admitting: Cardiology

## 2015-12-28 ENCOUNTER — Ambulatory Visit (INDEPENDENT_AMBULATORY_CARE_PROVIDER_SITE_OTHER): Payer: Medicare Other | Admitting: Pharmacist

## 2015-12-28 DIAGNOSIS — I4891 Unspecified atrial fibrillation: Secondary | ICD-10-CM

## 2015-12-28 DIAGNOSIS — Z954 Presence of other heart-valve replacement: Secondary | ICD-10-CM

## 2015-12-28 DIAGNOSIS — Z7901 Long term (current) use of anticoagulants: Secondary | ICD-10-CM

## 2015-12-28 DIAGNOSIS — Z952 Presence of prosthetic heart valve: Secondary | ICD-10-CM

## 2015-12-28 LAB — POCT INR: INR: 2.4

## 2016-01-04 DIAGNOSIS — I10 Essential (primary) hypertension: Secondary | ICD-10-CM | POA: Diagnosis not present

## 2016-01-04 DIAGNOSIS — I2510 Atherosclerotic heart disease of native coronary artery without angina pectoris: Secondary | ICD-10-CM | POA: Diagnosis not present

## 2016-01-04 DIAGNOSIS — M353 Polymyalgia rheumatica: Secondary | ICD-10-CM | POA: Diagnosis not present

## 2016-01-04 DIAGNOSIS — D649 Anemia, unspecified: Secondary | ICD-10-CM | POA: Diagnosis not present

## 2016-01-04 DIAGNOSIS — M109 Gout, unspecified: Secondary | ICD-10-CM | POA: Diagnosis not present

## 2016-01-04 DIAGNOSIS — Z125 Encounter for screening for malignant neoplasm of prostate: Secondary | ICD-10-CM | POA: Diagnosis not present

## 2016-01-04 DIAGNOSIS — Z79899 Other long term (current) drug therapy: Secondary | ICD-10-CM | POA: Diagnosis not present

## 2016-01-04 DIAGNOSIS — N189 Chronic kidney disease, unspecified: Secondary | ICD-10-CM | POA: Diagnosis not present

## 2016-01-12 ENCOUNTER — Other Ambulatory Visit (HOSPITAL_COMMUNITY): Payer: Self-pay | Admitting: Internal Medicine

## 2016-01-12 DIAGNOSIS — R1013 Epigastric pain: Secondary | ICD-10-CM | POA: Diagnosis not present

## 2016-01-12 DIAGNOSIS — N183 Chronic kidney disease, stage 3 (moderate): Secondary | ICD-10-CM | POA: Diagnosis not present

## 2016-01-12 DIAGNOSIS — I4891 Unspecified atrial fibrillation: Secondary | ICD-10-CM | POA: Diagnosis not present

## 2016-01-12 DIAGNOSIS — Z6834 Body mass index (BMI) 34.0-34.9, adult: Secondary | ICD-10-CM | POA: Diagnosis not present

## 2016-01-18 ENCOUNTER — Ambulatory Visit (HOSPITAL_COMMUNITY)
Admission: RE | Admit: 2016-01-18 | Discharge: 2016-01-18 | Disposition: A | Discharge status: Home / Self Care | Payer: Medicare Other | Source: Ambulatory Visit | Attending: Internal Medicine | Admitting: Internal Medicine

## 2016-01-18 DIAGNOSIS — R1013 Epigastric pain: Secondary | ICD-10-CM | POA: Diagnosis not present

## 2016-01-19 ENCOUNTER — Other Ambulatory Visit (HOSPITAL_COMMUNITY): Payer: Self-pay | Admitting: Internal Medicine

## 2016-01-19 DIAGNOSIS — R109 Unspecified abdominal pain: Secondary | ICD-10-CM

## 2016-01-23 ENCOUNTER — Other Ambulatory Visit (HOSPITAL_COMMUNITY): Payer: Medicare Other

## 2016-01-23 DIAGNOSIS — L570 Actinic keratosis: Secondary | ICD-10-CM | POA: Diagnosis not present

## 2016-01-23 DIAGNOSIS — C4441 Basal cell carcinoma of skin of scalp and neck: Secondary | ICD-10-CM | POA: Diagnosis not present

## 2016-01-23 DIAGNOSIS — L814 Other melanin hyperpigmentation: Secondary | ICD-10-CM | POA: Diagnosis not present

## 2016-01-23 DIAGNOSIS — D1801 Hemangioma of skin and subcutaneous tissue: Secondary | ICD-10-CM | POA: Diagnosis not present

## 2016-01-23 DIAGNOSIS — D225 Melanocytic nevi of trunk: Secondary | ICD-10-CM | POA: Diagnosis not present

## 2016-01-23 DIAGNOSIS — Z85828 Personal history of other malignant neoplasm of skin: Secondary | ICD-10-CM | POA: Diagnosis not present

## 2016-01-23 DIAGNOSIS — L821 Other seborrheic keratosis: Secondary | ICD-10-CM | POA: Diagnosis not present

## 2016-01-25 ENCOUNTER — Other Ambulatory Visit: Payer: Self-pay | Admitting: Internal Medicine

## 2016-01-26 ENCOUNTER — Ambulatory Visit (HOSPITAL_COMMUNITY)
Admission: RE | Admit: 2016-01-26 | Discharge: 2016-01-26 | Disposition: A | Discharge status: Home / Self Care | Payer: Medicare Other | Source: Ambulatory Visit | Attending: Internal Medicine | Admitting: Internal Medicine

## 2016-01-26 DIAGNOSIS — K30 Functional dyspepsia: Secondary | ICD-10-CM | POA: Diagnosis not present

## 2016-01-26 DIAGNOSIS — R109 Unspecified abdominal pain: Secondary | ICD-10-CM

## 2016-01-26 DIAGNOSIS — K224 Dyskinesia of esophagus: Secondary | ICD-10-CM | POA: Insufficient documentation

## 2016-02-09 ENCOUNTER — Ambulatory Visit (INDEPENDENT_AMBULATORY_CARE_PROVIDER_SITE_OTHER): Payer: Medicare Other | Admitting: Cardiology

## 2016-02-09 ENCOUNTER — Encounter: Payer: Self-pay | Admitting: Cardiology

## 2016-02-09 ENCOUNTER — Encounter (INDEPENDENT_AMBULATORY_CARE_PROVIDER_SITE_OTHER): Payer: Medicare Other

## 2016-02-09 VITALS — BP 110/70 | HR 83 | Ht 68.0 in | Wt 217.0 lb

## 2016-02-09 DIAGNOSIS — I480 Paroxysmal atrial fibrillation: Secondary | ICD-10-CM | POA: Diagnosis not present

## 2016-02-09 DIAGNOSIS — I2510 Atherosclerotic heart disease of native coronary artery without angina pectoris: Secondary | ICD-10-CM

## 2016-02-09 DIAGNOSIS — Z7901 Long term (current) use of anticoagulants: Secondary | ICD-10-CM | POA: Diagnosis not present

## 2016-02-09 DIAGNOSIS — I25119 Atherosclerotic heart disease of native coronary artery with unspecified angina pectoris: Secondary | ICD-10-CM

## 2016-02-09 DIAGNOSIS — Z954 Presence of other heart-valve replacement: Secondary | ICD-10-CM | POA: Diagnosis not present

## 2016-02-09 DIAGNOSIS — E785 Hyperlipidemia, unspecified: Secondary | ICD-10-CM

## 2016-02-09 DIAGNOSIS — Z953 Presence of xenogenic heart valve: Secondary | ICD-10-CM

## 2016-02-09 DIAGNOSIS — I4891 Unspecified atrial fibrillation: Secondary | ICD-10-CM

## 2016-02-09 DIAGNOSIS — I442 Atrioventricular block, complete: Secondary | ICD-10-CM

## 2016-02-09 NOTE — Progress Notes (Signed)
Cardiology Office Note  Date: 02/09/2016   ID: SHADEN HAUGHT, DOB 1927/11/20, MRN [id number]  PCP: [name], MD  Primary Cardiologist: Rozann Lesches, MD   Chief Complaint  Patient presents with  . Coronary Artery Disease    History of Present Illness: Douglas [name] is an 82 y.o. male last seen in January. He presents for a routine follow-up visit. Reports stable dyspnea on exertion, no angina symptoms. Has a chronic feeling of constriction in his lower sternal area following CABG, does have some tenting of his skin at the scar site and restriction noted. He is not interested in seeing a surgeon about this as it does not limit him dramatically.  He continues to follow in the device clinic, Medtronic pacemaker in place.  He remains on Coumadin with follow-up in the anticoagulation clinic. Does not report any spontaneous bleeding problems.  I reviewed his remaining medications. Cardiac regimen includes Lasix, Toprol-XL, and Pravachol. Most recent lab work in March indicated below, LDL was 76.  Past Medical History  Diagnosis Date  . Essential hypertension, benign   . DJD (degenerative joint disease)   . PMR (polymyalgia rheumatica) (HCC)   . Gout   . Arthritis   . Aortic stenosis     Bioprosthetic AVR  . Hx of adenomatous colonic polyps   . Aneurysm of thoracic aorta (HCC)     Ascending. 5.7 x 5.5cm - Bentall procedure  . Coronary atherosclerosis of native coronary artery     Multivessel - LIMA to LAD, SVG to OM, SVG to PDA  . H/O hiatal hernia   . Esophageal dysmotility   . Fibromyalgia   . Complete heart block ([place])     Status post pacemaker placement  . Atrial fibrillation ([place])     Documented on pacer interrogation 6/13  . History of skin cancer     Past Surgical History  Procedure Laterality Date  . Bilateral knee arthroscopy    . Appendectomy    . Hemorroidectomy    . Left inguinal hernia repair    . Hernia repair    . Back surgery      1965 or 1966  lumbar back surgery  . Bentall procedure  01/24/2012    Procedure: BENTALL PROCEDURE;  Surgeon: Ivin Poot, MD;  Location: [facility];  Service: Open Heart Surgery;  Laterality: N/A;  . Coronary artery bypass graft  01/24/2012    Procedure: CORONARY ARTERY BYPASS GRAFTING (CABG);  Surgeon: Ivin Poot, MD;  Location: [facility];  Service: Open Heart Surgery;  Laterality: N/A;  . Pacemaker placement      Medtronic 4/13 - Dr. Rayann Heman  . Aortic valve replacement  01/24/12  . Esophagogastroduodenoscopy  06/17/2012    Procedure: ESOPHAGOGASTRODUODENOSCOPY (EGD);  Surgeon: Rogene Houston, MD;  [facility];  Service: Endoscopy;  Laterality: N/A;  ED  . Permanent pacemaker insertion N/A 02/01/2012    Procedure: PERMANENT PACEMAKER INSERTION;  Surgeon: Thompson Grayer, MD; [place] 1 (serial number [id number])      Current Outpatient Prescriptions  Medication Sig Dispense Refill  . allopurinol (ZYLOPRIM) 300 MG tablet Take 450 mg by mouth daily. Take 1 1/2 daily    . furosemide (LASIX) 40 MG tablet TAKE 1 AND 1/2 TABLETS DAILY. 45 tablet 9  . metoprolol succinate (TOPROL-XL) 25 MG 24 hr tablet TAKE (2) TABLETS BY MOUTH DAILY. 60 tablet 3  . pantoprazole (PROTONIX) 40 MG tablet TAKE ONE TABLET BY MOUTH ONCE  DAILY IN THE MORNING. 30 tablet 6  . pravastatin (PRAVACHOL) 20 MG tablet Take 20 mg by mouth daily.   11  . warfarin (COUMADIN) 5 MG tablet Take 1 tablet (5 mg total) by mouth one time only at 6 PM. 90 tablet 3   No current facility-administered medications for this visit.   Allergies:  Imdur   Social History: The patient  reports that he has never smoked. He has never used smokeless tobacco. He reports that he does not drink alcohol or use illicit drugs.   ROS:  Please see the history of present illness. Otherwise, complete review of systems is positive for arthritic pains.  All other systems are reviewed and negative.   Physical Exam: VS:  BP 110/70 mmHg  Pulse 83   Ht 5\' 8"  (1.727 m)  Wt 217 lb (98.431 kg)  BMI 33.00 kg/m2  SpO2 97%, BMI Body mass index is 33 kg/(m^2).  Wt Readings from Last 3 Encounters:  02/09/16 217 lb (98.431 kg)  11/11/15 214 lb (97.07 kg)  10/28/15 213 lb 12.8 oz (96.979 kg)    General: Overweight elderly male, appears comfortable at rest. HEENT: Conjunctiva and lids normal, oropharynx clear.  Neck: Supple, increased girth, no carotid bruits, no thyromegaly.  Thorax: Stable pacer pocket site. Well healing midline sternal incision.  Lungs: Decreased breath sounds, nonlabored breathing at rest.  Cardiac: RRR, no S3, 2/6 basal systolic murmur, no pericardial rub.  Abdomen: Soft, nontender, bowel sounds present, no guarding or rebound.  Extremities: 1+ edema.   ECG: I personally reviewed the prior tracing from 10/28/2015 which showed dual-chamber pacing.  Recent Labwork:  March 2017: BUN 22, creatinine 1.3, potassium 4.1, AST 17, ALT 9, hemoglobin 11.8, platelets 260, cholesterol 142, triglycerides 176, HDL 31, LDL 76  Other Studies Reviewed Today:  Lexiscan Cardiolite 11/07/2015:  There was no ST segment deviation noted during stress.  Findings consistent with prior myocardial infarction with peri-infarct ischemia. There is a moderate sized inferior wall scar with mild peri-infarct ischemia.  The left ventricular ejection fraction is severely decreased (<30%).  This is a high risk study. High risk primarily based on low ejection fraction, there is fairly small amount of myocardium currently at jeopardy. Consider echocardiogram to better evaluate LVEF.  Echocardiogram 11/10/2015: Study Conclusions  - Left ventricle: The cavity size was normal. Wall thickness was increased in a pattern of moderate LVH. Systolic function was normal. The estimated ejection fraction was in the range of 55% to 60%. Wall motion was normal; there were no regional wall motion abnormalities. Features are consistent with a  pseudonormal left ventricular filling pattern, with concomitant abnormal relaxation and increased filling pressure (grade 2 diastolic dysfunction). Doppler parameters are consistent with high ventricular filling pressure. - Aortic valve: History of Bental procedure with 23 mm Edwards pericardial valve along with 56mm Gelweave Valsalva ascending aorta graft. Leaflets poorly visualized, there does appear to be decreased leaflet mobility however there is no significant gradient across the valve. Mildly calcified annulus. Mildly thickened leaflets. Mean gradient (S): 5 mm Hg. Peak gradient (S): 10 mm Hg. VTI ratio of LVOT to aortic valve: 0.76. Valve area (VTI): 1.94 cm^2. Valve area (Vmax): 1.89 cm^2. Valve area (Vmean): 1.96 cm^2. - Aorta: Patient with history of Bentall procedure with 28 mm Gelweave Valsalva graft. - Aortic root: The aortic root was normal in size. - Mitral valve: Mildly calcified annulus. Mildly thickened leaflets . There was mild regurgitation. - Right atrium: The atrium was mildly dilated. - Atrial  septum: No defect or patent foramen ovale was identified. - Technically difficult study. Echocontrast was used to enhance visualization.  Assessment and Plan:  1. Multivessel CAD status post CABG in 2013. He is symptomatically stable without active angina. Plan to continue medical therapy and observation.  2. Ascending thoracic aortic aneurysm status post Bentall procedure at time of CABG in 2013. Echocardiogram from earlier this year showed stable prosthesis.  3. Paroxysmal atrial fibrillation detected by pacemaker interrogation. He continues on Coumadin for stroke prophylaxis. Keep follow-up in anticoagulation clinic.  4. Hyperlipidemia, on statin therapy with good LDL control, recently 76.  5. Complete heart block status post Medtronic pacemaker placement. Continue follow-up in the device clinic.  Current medicines were reviewed with  the patient today.  Disposition: FU with me in 6 months.   Signed, Satira Sark, MD, [facility] 02/09/2016 8:46 AM    [facility]. [address], [place], [place] [address] Phone: [phone]; Fax: [phone]

## 2016-02-09 NOTE — Patient Instructions (Signed)
Your physician wants you to follow-up in: 6 months with Dr Ferne Reus will receive a reminder letter in the mail two months in advance. If you don't receive a letter, please call our office to schedule the follow-up appointment.   Your physician recommends that you continue on your current medications as directed. Please refer to the Current Medication list given to you today.   If you need a refill on your cardiac medications before your next appointment, please call your pharmacy.    Your INR today is: 2.4        We will notify Edrick Oh RN and she will call you.    Thank you for choosing [facility] !

## 2016-02-13 DIAGNOSIS — D649 Anemia, unspecified: Secondary | ICD-10-CM | POA: Diagnosis not present

## 2016-02-29 ENCOUNTER — Telehealth: Payer: Self-pay | Admitting: *Deleted

## 2016-02-29 ENCOUNTER — Ambulatory Visit (INDEPENDENT_AMBULATORY_CARE_PROVIDER_SITE_OTHER): Payer: Medicare Other | Admitting: *Deleted

## 2016-02-29 DIAGNOSIS — Z954 Presence of other heart-valve replacement: Secondary | ICD-10-CM

## 2016-02-29 DIAGNOSIS — I4891 Unspecified atrial fibrillation: Secondary | ICD-10-CM

## 2016-02-29 DIAGNOSIS — Z952 Presence of prosthetic heart valve: Secondary | ICD-10-CM

## 2016-02-29 LAB — POCT INR: INR: 2.4

## 2016-02-29 NOTE — Telephone Encounter (Signed)
pls call pt, he said someone wass supposed to call him and let him know something about his visit from the April 20th and he never heard anything

## 2016-02-29 NOTE — Telephone Encounter (Signed)
Called pt.  States INR was checked when he was at Dr McDowell's appt on 02/09/16.  I did not get these results.  Pt states INR was 2.4.  Pt to continue current dose of coumadin and f/u INR appt made.  See coumadin note.

## 2016-03-21 ENCOUNTER — Ambulatory Visit (INDEPENDENT_AMBULATORY_CARE_PROVIDER_SITE_OTHER): Payer: Medicare Other | Admitting: *Deleted

## 2016-03-21 DIAGNOSIS — I4891 Unspecified atrial fibrillation: Secondary | ICD-10-CM

## 2016-03-21 DIAGNOSIS — Z954 Presence of other heart-valve replacement: Secondary | ICD-10-CM | POA: Diagnosis not present

## 2016-03-21 DIAGNOSIS — Z7901 Long term (current) use of anticoagulants: Secondary | ICD-10-CM

## 2016-03-21 DIAGNOSIS — Z952 Presence of prosthetic heart valve: Secondary | ICD-10-CM

## 2016-03-21 LAB — POCT INR: INR: 2.9

## 2016-03-26 ENCOUNTER — Other Ambulatory Visit: Payer: Self-pay | Admitting: Internal Medicine

## 2016-03-30 ENCOUNTER — Encounter: Payer: Self-pay | Admitting: Internal Medicine

## 2016-03-30 ENCOUNTER — Ambulatory Visit (INDEPENDENT_AMBULATORY_CARE_PROVIDER_SITE_OTHER): Payer: Medicare Other | Admitting: Internal Medicine

## 2016-03-30 VITALS — BP 120/64 | HR 78 | Ht 67.0 in | Wt 215.0 lb

## 2016-03-30 DIAGNOSIS — I442 Atrioventricular block, complete: Secondary | ICD-10-CM | POA: Diagnosis not present

## 2016-03-30 DIAGNOSIS — I25119 Atherosclerotic heart disease of native coronary artery with unspecified angina pectoris: Secondary | ICD-10-CM

## 2016-03-30 LAB — CUP PACEART INCLINIC DEVICE CHECK
Brady Statistic AP VS Percent: 0 %
Brady Statistic AS VS Percent: 0 %
Date Time Interrogation Session: 20170609104049
Implantable Lead Implant Date: 20130412
Implantable Lead Location: 753860
Implantable Lead Model: 5076
Lead Channel Pacing Threshold Amplitude: 0.5 V
Lead Channel Pacing Threshold Pulse Width: 0.4 ms
Lead Channel Pacing Threshold Pulse Width: 0.4 ms
Lead Channel Setting Pacing Amplitude: 2 V
Lead Channel Setting Pacing Pulse Width: 0.4 ms
Lead Channel Setting Sensing Sensitivity: 4 mV
MDC IDC LEAD IMPLANT DT: 20130412
MDC IDC LEAD LOCATION: 753859
MDC IDC MSMT BATTERY IMPEDANCE: 305 Ohm
MDC IDC MSMT BATTERY REMAINING LONGEVITY: 100 mo
MDC IDC MSMT BATTERY VOLTAGE: 2.78 V
MDC IDC MSMT LEADCHNL RA IMPEDANCE VALUE: 465 Ohm
MDC IDC MSMT LEADCHNL RA PACING THRESHOLD AMPLITUDE: 0.5 V
MDC IDC MSMT LEADCHNL RA PACING THRESHOLD PULSEWIDTH: 0.4 ms
MDC IDC MSMT LEADCHNL RA SENSING INTR AMPL: 2 mV
MDC IDC MSMT LEADCHNL RV IMPEDANCE VALUE: 544 Ohm
MDC IDC MSMT LEADCHNL RV PACING THRESHOLD AMPLITUDE: 0.5 V
MDC IDC SET LEADCHNL RA PACING AMPLITUDE: 2 V
MDC IDC STAT BRADY AP VP PERCENT: 73 %
MDC IDC STAT BRADY AS VP PERCENT: 27 %

## 2016-03-30 NOTE — Patient Instructions (Signed)

## 2016-03-30 NOTE — Progress Notes (Signed)
HPI [name] returns today for followup. He is a pleasant 82 yo man with a h/o CHB, after AVR/Bentall with a tissue valve, s/p PPM, HTN and chronic coumadin therapy. In the interim, he has done well. He denies chest pain or sob. No syncope. Minimal peripheral edema. He remains active running a chain saw; he heats his house with wood. Allergies  Allergen Reactions  . Imdur [Isosorbide Nitrate] Other (See Comments)    Nausea and lightheaded      Current Outpatient Prescriptions  Medication Sig Dispense Refill  . allopurinol (ZYLOPRIM) 300 MG tablet Take 450 mg by mouth daily. Take 1 1/2 daily    . furosemide (LASIX) 40 MG tablet TAKE 1 AND 1/2 TABLETS DAILY. 45 tablet 6  . metoprolol succinate (TOPROL-XL) 25 MG 24 hr tablet TAKE (2) TABLETS BY MOUTH DAILY. 60 tablet 3  . pantoprazole (PROTONIX) 40 MG tablet TAKE ONE TABLET BY MOUTH ONCE DAILY IN THE MORNING. 30 tablet 6  . pravastatin (PRAVACHOL) 20 MG tablet Take 20 mg by mouth daily.   11  . warfarin (COUMADIN) 5 MG tablet Take 1 tablet (5 mg total) by mouth one time only at 6 PM. 90 tablet 3   No current facility-administered medications for this visit.     Past Medical History  Diagnosis Date  . Essential hypertension, benign   . DJD (degenerative joint disease)   . PMR (polymyalgia rheumatica) (HCC)   . Gout   . Arthritis   . Aortic stenosis     Bioprosthetic AVR  . Hx of adenomatous colonic polyps   . Aneurysm of thoracic aorta (HCC)     Ascending. 5.7 x 5.5cm - Bentall procedure  . Coronary atherosclerosis of native coronary artery     Multivessel - LIMA to LAD, SVG to OM, SVG to PDA  . H/O hiatal hernia   . Esophageal dysmotility   . Fibromyalgia   . Complete heart block ([place])     Status post pacemaker placement  . Atrial fibrillation ([place])     Documented on pacer interrogation 6/13  . History of skin cancer     ROS:   All systems reviewed and negative except as noted in the HPI.   Past Surgical  History  Procedure Laterality Date  . Bilateral knee arthroscopy    . Appendectomy    . Hemorroidectomy    . Left inguinal hernia repair    . Hernia repair    . Back surgery      1965 or 1966 lumbar back surgery  . Bentall procedure  01/24/2012    Procedure: BENTALL PROCEDURE;  Surgeon: Ivin Poot, MD;  Location: [facility];  Service: Open Heart Surgery;  Laterality: N/A;  . Coronary artery bypass graft  01/24/2012    Procedure: CORONARY ARTERY BYPASS GRAFTING (CABG);  Surgeon: Ivin Poot, MD;  Location: [facility];  Service: Open Heart Surgery;  Laterality: N/A;  . Pacemaker placement      Medtronic 4/13 - Dr. Rayann Heman  . Aortic valve replacement  01/24/12  . Esophagogastroduodenoscopy  06/17/2012    Procedure: ESOPHAGOGASTRODUODENOSCOPY (EGD);  Surgeon: Rogene Houston, MD;  [facility];  Service: Endoscopy;  Laterality: N/A;  ED  . Permanent pacemaker insertion N/A 02/01/2012    Procedure: PERMANENT PACEMAKER INSERTION;  Surgeon: Thompson Grayer, MD; [place] 1 (serial number [id number])       Family History  Problem Relation Age of Onset  .  Kidney disease Mother   . Heart disease Father   . Anesthesia problems Neg Hx   . Hypotension Neg Hx   . Malignant hyperthermia Neg Hx   . Pseudochol deficiency Neg Hx      Social History   Social History  . Marital Status: Married    Spouse Name: N/A  . Number of Children: 4  . Years of Education: N/A   Occupational History  . Retired     Scientist, forensic   Social History Main Topics  . Smoking status: Never Smoker   . Smokeless tobacco: Never Used  . Alcohol Use: No  . Drug Use: No  . Sexual Activity: No   Other Topics Concern  . Not on file   Social History Narrative   Lives with wife, has been married since 20.     BP 120/64 mmHg  Pulse 78  Ht 5\' 7"  (1.702 m)  Wt 215 lb (97.523 kg)  BMI 33.67 kg/m2  SpO2 99%  Physical Exam:  Well appearing 82 yo man, NAD HEENT: Unremarkable Neck:   6 cm JVD, no thyromegally Lymphatics:  No adenopathy Back:  No CVA tenderness Lungs:  Clear with no wheezes.  HEART:  Regular rate rhythm, no murmurs, no rubs, no clicks, with no murmur. Abd:  soft, positive bowel sounds, no organomegally, no rebound, no guarding Ext:  2 plus pulses, no edema, no cyanosis, no clubbing Skin:  No rashes no nodules Neuro:  CN II through XII intact, motor grossly intact   DEVICE  Normal device function.  See PaceArt for details.   Assess/Plan: 1. Chronic diastolic heart failure - he is doing well. Symptoms are really class1. No change in meds. 2. HTN -his blood pressure is well controlled. No change in meds. 3. PPM - his Medtronic DDD PM is working normally. Will recheck in several months.  Douglas [name].D.

## 2016-05-02 ENCOUNTER — Ambulatory Visit (INDEPENDENT_AMBULATORY_CARE_PROVIDER_SITE_OTHER): Payer: Medicare Other | Admitting: Pharmacist

## 2016-05-02 DIAGNOSIS — Z954 Presence of other heart-valve replacement: Secondary | ICD-10-CM | POA: Diagnosis not present

## 2016-05-02 DIAGNOSIS — Z952 Presence of prosthetic heart valve: Secondary | ICD-10-CM

## 2016-05-02 DIAGNOSIS — Z7901 Long term (current) use of anticoagulants: Secondary | ICD-10-CM

## 2016-05-02 DIAGNOSIS — I4891 Unspecified atrial fibrillation: Secondary | ICD-10-CM | POA: Diagnosis not present

## 2016-05-02 LAB — POCT INR: INR: 2.7

## 2016-05-07 DIAGNOSIS — I2510 Atherosclerotic heart disease of native coronary artery without angina pectoris: Secondary | ICD-10-CM | POA: Diagnosis not present

## 2016-05-07 DIAGNOSIS — Z79899 Other long term (current) drug therapy: Secondary | ICD-10-CM | POA: Diagnosis not present

## 2016-05-07 DIAGNOSIS — N189 Chronic kidney disease, unspecified: Secondary | ICD-10-CM | POA: Diagnosis not present

## 2016-05-15 DIAGNOSIS — I5032 Chronic diastolic (congestive) heart failure: Secondary | ICD-10-CM | POA: Diagnosis not present

## 2016-05-15 DIAGNOSIS — I10 Essential (primary) hypertension: Secondary | ICD-10-CM | POA: Diagnosis not present

## 2016-05-18 ENCOUNTER — Other Ambulatory Visit: Payer: Self-pay | Admitting: Internal Medicine

## 2016-05-18 ENCOUNTER — Encounter (HOSPITAL_COMMUNITY): Payer: Self-pay

## 2016-05-18 ENCOUNTER — Inpatient Hospital Stay (HOSPITAL_COMMUNITY)
Admission: AD | Admit: 2016-05-18 | Discharge: 2016-05-20 | DRG: 378 | Disposition: A | Discharge status: Home / Self Care | Payer: Medicare Other | Source: Ambulatory Visit | Attending: Internal Medicine | Admitting: Internal Medicine

## 2016-05-18 DIAGNOSIS — D62 Acute posthemorrhagic anemia: Secondary | ICD-10-CM | POA: Diagnosis present

## 2016-05-18 DIAGNOSIS — Z8711 Personal history of peptic ulcer disease: Secondary | ICD-10-CM

## 2016-05-18 DIAGNOSIS — M797 Fibromyalgia: Secondary | ICD-10-CM | POA: Diagnosis not present

## 2016-05-18 DIAGNOSIS — M109 Gout, unspecified: Secondary | ICD-10-CM | POA: Diagnosis present

## 2016-05-18 DIAGNOSIS — K5521 Angiodysplasia of colon with hemorrhage: Principal | ICD-10-CM | POA: Diagnosis present

## 2016-05-18 DIAGNOSIS — I2510 Atherosclerotic heart disease of native coronary artery without angina pectoris: Secondary | ICD-10-CM | POA: Diagnosis present

## 2016-05-18 DIAGNOSIS — K922 Gastrointestinal hemorrhage, unspecified: Secondary | ICD-10-CM

## 2016-05-18 DIAGNOSIS — Z8719 Personal history of other diseases of the digestive system: Secondary | ICD-10-CM

## 2016-05-18 DIAGNOSIS — Z85828 Personal history of other malignant neoplasm of skin: Secondary | ICD-10-CM

## 2016-05-18 DIAGNOSIS — D649 Anemia, unspecified: Secondary | ICD-10-CM

## 2016-05-18 DIAGNOSIS — Z952 Presence of prosthetic heart valve: Secondary | ICD-10-CM

## 2016-05-18 DIAGNOSIS — Z7901 Long term (current) use of anticoagulants: Secondary | ICD-10-CM | POA: Diagnosis not present

## 2016-05-18 DIAGNOSIS — I480 Paroxysmal atrial fibrillation: Secondary | ICD-10-CM | POA: Diagnosis present

## 2016-05-18 DIAGNOSIS — I959 Hypotension, unspecified: Secondary | ICD-10-CM | POA: Diagnosis not present

## 2016-05-18 DIAGNOSIS — Z951 Presence of aortocoronary bypass graft: Secondary | ICD-10-CM

## 2016-05-18 DIAGNOSIS — Z86010 Personal history of colonic polyps: Secondary | ICD-10-CM

## 2016-05-18 DIAGNOSIS — I10 Essential (primary) hypertension: Secondary | ICD-10-CM | POA: Diagnosis not present

## 2016-05-18 DIAGNOSIS — Z8679 Personal history of other diseases of the circulatory system: Secondary | ICD-10-CM

## 2016-05-18 DIAGNOSIS — Z841 Family history of disorders of kidney and ureter: Secondary | ICD-10-CM

## 2016-05-18 DIAGNOSIS — I9589 Other hypotension: Secondary | ICD-10-CM

## 2016-05-18 DIAGNOSIS — Z8249 Family history of ischemic heart disease and other diseases of the circulatory system: Secondary | ICD-10-CM | POA: Diagnosis not present

## 2016-05-18 DIAGNOSIS — Z953 Presence of xenogenic heart valve: Secondary | ICD-10-CM | POA: Diagnosis not present

## 2016-05-18 DIAGNOSIS — N183 Chronic kidney disease, stage 3 (moderate): Secondary | ICD-10-CM | POA: Diagnosis not present

## 2016-05-18 DIAGNOSIS — K921 Melena: Secondary | ICD-10-CM

## 2016-05-18 DIAGNOSIS — Z8774 Personal history of (corrected) congenital malformations of heart and circulatory system: Secondary | ICD-10-CM

## 2016-05-18 DIAGNOSIS — Z79899 Other long term (current) drug therapy: Secondary | ICD-10-CM | POA: Diagnosis not present

## 2016-05-18 DIAGNOSIS — M353 Polymyalgia rheumatica: Secondary | ICD-10-CM | POA: Diagnosis not present

## 2016-05-18 DIAGNOSIS — Z950 Presence of cardiac pacemaker: Secondary | ICD-10-CM | POA: Diagnosis not present

## 2016-05-18 HISTORY — DX: Gastrointestinal hemorrhage, unspecified: K92.2

## 2016-05-18 LAB — PREPARE RBC (CROSSMATCH)

## 2016-05-18 LAB — ABO/RH: ABO/RH(D): B POS

## 2016-05-18 LAB — PROTIME-INR
INR: 3.06
Prothrombin Time: 32.3 seconds — ABNORMAL HIGH (ref 11.4–15.2)

## 2016-05-18 LAB — MRSA PCR SCREENING: MRSA by PCR: NEGATIVE

## 2016-05-18 LAB — HEMOGLOBIN AND HEMATOCRIT, BLOOD
HCT: 23 % — ABNORMAL LOW (ref 39.0–52.0)
Hemoglobin: 7.1 g/dL — ABNORMAL LOW (ref 13.0–17.0)

## 2016-05-18 MED ORDER — SODIUM CHLORIDE 0.9% FLUSH
3.0000 mL | Freq: Two times a day (BID) | INTRAVENOUS | Status: DC
  Administered 2016-05-18 – 2016-05-19 (×2): 3 mL via INTRAVENOUS

## 2016-05-18 MED ORDER — SODIUM CHLORIDE 0.9 % IV SOLN: Freq: Once | INTRAVENOUS | Status: DC

## 2016-05-18 MED ORDER — SODIUM CHLORIDE 0.9 % IV SOLN
INTRAVENOUS | Status: DC
  Administered 2016-05-19: 800 mL via INTRAVENOUS

## 2016-05-18 MED ORDER — ONDANSETRON HCL 4 MG/2ML IJ SOLN
4.0000 mg | Freq: Four times a day (QID) | INTRAMUSCULAR | Status: DC | PRN
Start: 2016-05-18 — End: 2016-05-20

## 2016-05-18 MED ORDER — PANTOPRAZOLE SODIUM 40 MG IV SOLR
40.0000 mg | Freq: Two times a day (BID) | INTRAVENOUS | Status: DC
  Administered 2016-05-18: 40 mg via INTRAVENOUS
  Filled 2016-05-18 (×2): qty 40

## 2016-05-18 MED ORDER — ACETAMINOPHEN 650 MG RE SUPP: 650.0000 mg | Freq: Four times a day (QID) | RECTAL | Status: DC | PRN

## 2016-05-18 MED ORDER — ONDANSETRON HCL 4 MG PO TABS: 4.0000 mg | ORAL_TABLET | Freq: Four times a day (QID) | ORAL | Status: DC | PRN

## 2016-05-18 MED ORDER — ACETAMINOPHEN 325 MG PO TABS: 650.0000 mg | ORAL_TABLET | Freq: Four times a day (QID) | ORAL | Status: DC | PRN

## 2016-05-18 NOTE — Progress Notes (Signed)
Called pager and Dr. Oneida Alar called back for consult.

## 2016-05-18 NOTE — Consult Note (Signed)
Referring Provider: No ref. provider found Primary Care Physician:  [name], MD Primary Gastroenterologist:  Dr. Laural Golden  Date of Admission: 05/18/16 Date of Consultation: 05/18/16  Reason for Consultation:  Acute upper GI bleed, anemia  HPI:  Douglas [name] is a 82 y.o. male with a past medical history of Atrical fibrillation, complete heart block, CAD s/p aortic valve replacement, CABG on chronic coumadin was admitted with acute anmeia and dark stools concerning for acute upper GI bleed. Review of labs show last hgb 11.8 in March of this year (4 months ago) and 7.1 on admission. Full CBC pending. INR 3.06. Last EGD by Dr. Laural Golden in 2013 for melena/anemia showed tortuous proximal esophagus below UES, small ulcer at GE junction without stigmata of bleed, small sliding hiatal hernia, non-bleeding AV malformation in gastric body s/p ablation.  Today he states he began having nausea yesterday in addition to fatigue, weakness. Also with "swimmy head." Came home to rest. Took Senna last night and between last night and this morning he had too large, black bowel movements. Went to PCP who found his stool to be heme+ and was referred to the ED. Denies abdominal pain, chest pain, shortness of breath. Denies NSAID use and ASA powders. No GERD symptoms. States his INR has been pretty well controlled. No other upper or lower GI symptoms.  Past Medical History:  Diagnosis Date  . Aneurysm of thoracic aorta (HCC)    Ascending. 5.7 x 5.5cm - Bentall procedure  . Aortic stenosis    Bioprosthetic AVR  . Arthritis   . Atrial fibrillation ([place])    Documented on pacer interrogation 6/13  . Complete heart block ([place])    Status post pacemaker placement  . Coronary atherosclerosis of native coronary artery    Multivessel - LIMA to LAD, SVG to OM, SVG to PDA  . DJD (degenerative joint disease)   . Esophageal dysmotility   . Essential hypertension, benign   . Fibromyalgia   . Gout   . H/O hiatal hernia   .  History of skin cancer   . Hx of adenomatous colonic polyps   . PMR (polymyalgia rheumatica) (HCC)     Past Surgical History:  Procedure Laterality Date  . AORTIC VALVE REPLACEMENT  01/24/12  . APPENDECTOMY    . [place] or 1966 lumbar back surgery  . BENTALL PROCEDURE  01/24/2012   Procedure: BENTALL PROCEDURE;  Surgeon: Ivin Poot, MD;  Location: [facility];  Service: Open Heart Surgery;  Laterality: N/A;  . BILATERAL KNEE ARTHROSCOPY    . CORONARY ARTERY BYPASS GRAFT  01/24/2012   Procedure: CORONARY ARTERY BYPASS GRAFTING (CABG);  Surgeon: Ivin Poot, MD;  Location: [facility];  Service: Open Heart Surgery;  Laterality: N/A;  . ESOPHAGOGASTRODUODENOSCOPY  06/17/2012   Procedure: ESOPHAGOGASTRODUODENOSCOPY (EGD);  Surgeon: Rogene Houston, MD;  [facility];  Service: Endoscopy;  Laterality: N/A;  ED  . HEMORROIDECTOMY    . HERNIA REPAIR    . Left inguinal hernia repair    . PACEMAKER PLACEMENT     Medtronic 4/13 - Dr. Rayann Heman  . PERMANENT PACEMAKER INSERTION N/A 02/01/2012   Procedure: PERMANENT PACEMAKER INSERTION;  Surgeon: Thompson Grayer, MD; [place] 1 (serial number [id number])      Prior to Admission medications   Medication Sig Start Date End Date Taking? Authorizing Provider  allopurinol (ZYLOPRIM) 300 MG tablet Take 450 mg by mouth daily. Take 1 1/2 daily  Historical Provider, MD  furosemide (LASIX) 40 MG tablet TAKE 1 AND 1/2 TABLETS DAILY. 03/26/16   Satira Sark, MD  metoprolol succinate (TOPROL-XL) 25 MG 24 hr tablet TAKE (2) TABLETS BY MOUTH DAILY. 01/25/16   Evans Lance, MD  pantoprazole (PROTONIX) 40 MG tablet TAKE ONE TABLET BY MOUTH ONCE DAILY IN THE MORNING. 11/24/15   Satira Sark, MD  pravastatin (PRAVACHOL) 20 MG tablet Take 20 mg by mouth daily.  10/27/15   Historical Provider, MD  warfarin (COUMADIN) 5 MG tablet Take 1 tablet (5 mg total) by mouth one time only at 6 PM. 05/30/15   Satira Sark, MD     Current Facility-Administered Medications  Medication Dose Route Frequency Provider Last Rate Last Dose  . 0.9 %  sodium chloride infusion   Intravenous Once [name], MD      . 0.9 %  sodium chloride infusion   Intravenous Continuous [name], MD      . 0.9 %  sodium chloride infusion   Intravenous Once [name], MD      . acetaminophen (TYLENOL) tablet 650 mg  650 mg Oral Q6H PRN [name], MD       Or  . acetaminophen (TYLENOL) suppository 650 mg  650 mg Rectal Q6H PRN [name], MD      . ondansetron [facility]) tablet 4 mg  4 mg Oral Q6H PRN [name], MD       Or  . ondansetron [facility]) injection 4 mg  4 mg Intravenous Q6H PRN [name], MD      . pantoprazole (PROTONIX) injection 40 mg  40 mg Intravenous Q12H [name], MD      . sodium chloride flush (NS) 0.9 % injection 3 mL  3 mL Intravenous Q12H [name], MD        Allergies as of 05/18/2016 - Review Complete 05/18/2016  Allergen Reaction Noted  . Imdur [isosorbide nitrate] Other (See Comments) 12/28/2015    Family History  Problem Relation Age of Onset  . Kidney disease Mother   . Heart disease Father   . Anesthesia problems Neg Hx   . Hypotension Neg Hx   . Malignant hyperthermia Neg Hx   . Pseudochol deficiency Neg Hx     Social History   Social History  . Marital status: Married    Spouse name: N/A  . Number of children: 4  . Years of education: N/A   Occupational History  . Retired     Scientist, forensic   Social History Main Topics  . Smoking status: Never Smoker  . Smokeless tobacco: Never Used  . Alcohol use No  . Drug use: No  . Sexual activity: No   Other Topics Concern  . Not on file   Social History Narrative   Lives with wife, has been married since 41.    Review of Systems: General: Negative for anorexia, weight loss, fever, chills, fatigue, weakness. Eyes: Negative for vision changes.  ENT: Negative for hoarseness, difficulty swallowing , nasal congestion. CV: Negative for chest  pain, angina, palpitations, dyspnea on exertion, peripheral edema.  Respiratory: Negative for dyspnea at rest, dyspnea on exertion, cough, sputum, wheezing.  GI: See history of present illness. GU:  Negative for dysuria, hematuria, urinary incontinence, urinary frequency, nocturnal urination.  MS: Negative for joint pain, low back pain.  Derm: Negative for rash or itching.  Neuro: Negative for weakness, abnormal sensation, seizure, frequent headaches, memory loss, confusion.  Psych: Negative  for anxiety, depression, suicidal ideation, hallucinations.  Endo: Negative for unusual weight change.  Heme: Negative for bruising or bleeding. Allergy: Negative for rash or hives.  Physical Exam: Vital signs in last 24 hours: Pulse Rate:  [70-76] 70 (07/28 1320) Resp:  [15] 15 (07/28 1300) BP: (100)/(48) 100/48 (07/28 1320) SpO2:  [99 %-100 %] 100 % (07/28 1320) Last BM Date: 05/18/16 General:   Alert,  Well-developed, well-nourished, pleasant and cooperative in NAD Head:  Normocephalic and atraumatic. Eyes:  Sclera clear, no icterus.   Conjunctiva pink. Ears:  Normal auditory acuity. Nose:  No deformity, discharge,  or lesions. Mouth:  No deformity or lesions, dentition normal. Neck:  Supple; no masses or thyromegaly. Lungs:  Clear throughout to auscultation.   No wheezes, crackles, or rhonchi. No acute distress. Heart:  Regular rate and rhythm; no murmurs, clicks, rubs,  or gallops. Abdomen:  Soft, nontender and nondistended. No masses, hepatosplenomegaly or hernias noted. Normal bowel sounds, without guarding, and without rebound.   Rectal:  Deferred until time of colonoscopy.   Msk:  Symmetrical without gross deformities. Normal posture. Pulses:  Normal pulses noted. Extremities:  Without clubbing or edema. Neurologic:  Alert and  oriented x4;  grossly normal neurologically. Skin:  Intact without significant lesions or rashes. Cervical Nodes:  No significant cervical adenopathy. Psych:   Alert and cooperative. Normal mood and affect.  Intake/Output from previous day: No intake/output data recorded. Intake/Output this shift: Total I/O In: -  Out: 250 [Urine:250]  Lab Results:  Recent Labs  05/18/16 1340  HGB 7.1*  HCT 23.0*   BMET No results for input(s): NA, K, CL, CO2, GLUCOSE, BUN, CREATININE, CALCIUM in the last 72 hours. LFT No results for input(s): PROT, ALBUMIN, AST, ALT, ALKPHOS, BILITOT, BILIDIR, IBILI in the last 72 hours. PT/INR  Recent Labs  05/18/16 1340  LABPROT 32.3*  INR 3.06   Hepatitis Panel No results for input(s): HEPBSAG, HCVAB, HEPAIGM, HEPBIGM in the last 72 hours. C-Diff No results for input(s): CDIFFTOX in the last 72 hours.  Studies/Results: No results found.  Impression: 82 year old male admitted with concerns for melena and anemia indicating possible upper GI bleed. He has had about a 4 gm drop in hgb from last known baseline, INR 3.0 on coumadin for AFib, mechanical valve, and CABG. Nurse attempting to start IV now for PRBC transfusion. Vitals mostly normal, but BPs soft (trend down to [id number] systolic.  Symptomatic anemia/melena - history of esophageal ulcer and gastric AVMs s/p ablation. Approximate 4 gm drop in hgb from baseline 3 months ago. Symptomatic anemia with lightheadedness, fatigue, weakness. Differentials include gastritis, esophagitis, esophageal ulcer, PUD, AVMs all in the setting of coumadin anticoagulation.  Plan: 1. Transfuse as necessary 2. Avoid all NSAIDs 3. Continue Protonix 4. Monitor H/H closely 5. Monitor for any recurrent melena 6. Likely need for EGD, will need INR <2.0; possibly Saturday   Walden Field, AGNP-C Adult & Gerontological Nurse Practitioner [facility]    LOS: 0 days     05/18/2016, 2:47 PM

## 2016-05-19 ENCOUNTER — Encounter (HOSPITAL_COMMUNITY)
Admission: AD | Disposition: A | Discharge status: Home / Self Care | Payer: Self-pay | Source: Ambulatory Visit | Attending: Internal Medicine

## 2016-05-19 ENCOUNTER — Encounter (HOSPITAL_COMMUNITY): Payer: Self-pay

## 2016-05-19 DIAGNOSIS — K921 Melena: Secondary | ICD-10-CM | POA: Diagnosis not present

## 2016-05-19 DIAGNOSIS — I959 Hypotension, unspecified: Secondary | ICD-10-CM | POA: Diagnosis not present

## 2016-05-19 DIAGNOSIS — Z8679 Personal history of other diseases of the circulatory system: Secondary | ICD-10-CM | POA: Diagnosis not present

## 2016-05-19 DIAGNOSIS — Z951 Presence of aortocoronary bypass graft: Secondary | ICD-10-CM | POA: Diagnosis not present

## 2016-05-19 DIAGNOSIS — D649 Anemia, unspecified: Secondary | ICD-10-CM | POA: Diagnosis not present

## 2016-05-19 DIAGNOSIS — I10 Essential (primary) hypertension: Secondary | ICD-10-CM | POA: Diagnosis not present

## 2016-05-19 DIAGNOSIS — K5521 Angiodysplasia of colon with hemorrhage: Secondary | ICD-10-CM | POA: Diagnosis not present

## 2016-05-19 DIAGNOSIS — D62 Acute posthemorrhagic anemia: Secondary | ICD-10-CM | POA: Diagnosis not present

## 2016-05-19 DIAGNOSIS — I480 Paroxysmal atrial fibrillation: Secondary | ICD-10-CM | POA: Diagnosis not present

## 2016-05-19 DIAGNOSIS — K922 Gastrointestinal hemorrhage, unspecified: Secondary | ICD-10-CM | POA: Diagnosis not present

## 2016-05-19 HISTORY — PX: ESOPHAGOGASTRODUODENOSCOPY: SHX5428

## 2016-05-19 LAB — TYPE AND SCREEN
ABO/RH(D): B POS
Antibody Screen: NEGATIVE
UNIT DIVISION: 0
Unit division: 0

## 2016-05-19 LAB — CBC
HEMATOCRIT: 26.8 % — AB (ref 39.0–52.0)
HEMOGLOBIN: 8.6 g/dL — AB (ref 13.0–17.0)
MCH: 26.9 pg (ref 26.0–34.0)
MCHC: 32.1 g/dL (ref 30.0–36.0)
MCV: 83.8 fL (ref 78.0–100.0)
Platelets: 172 10*3/uL (ref 150–400)
RBC: 3.2 MIL/uL — ABNORMAL LOW (ref 4.22–5.81)
RDW: 15.7 % — AB (ref 11.5–15.5)
WBC: 10.5 10*3/uL (ref 4.0–10.5)

## 2016-05-19 LAB — HEMOGLOBIN AND HEMATOCRIT, BLOOD
HCT: 25.7 % — ABNORMAL LOW (ref 39.0–52.0)
Hemoglobin: 8.4 g/dL — ABNORMAL LOW (ref 13.0–17.0)

## 2016-05-19 LAB — PROTIME-INR
INR: 2.88
INR: 2.76
PROTHROMBIN TIME: 29.8 s — AB (ref 11.4–15.2)
Prothrombin Time: 30.8 seconds — ABNORMAL HIGH (ref 11.4–15.2)

## 2016-05-19 SURGERY — EGD (ESOPHAGOGASTRODUODENOSCOPY)
Anesthesia: Moderate Sedation

## 2016-05-19 MED ORDER — LIDOCAINE VISCOUS 2 % MT SOLN
OROMUCOSAL | Status: AC
  Filled 2016-05-19: qty 15

## 2016-05-19 MED ORDER — MIDAZOLAM HCL 5 MG/5ML IJ SOLN
INTRAMUSCULAR | Status: DC | PRN
  Administered 2016-05-19: 2 mg via INTRAVENOUS

## 2016-05-19 MED ORDER — EPINEPHRINE HCL 0.1 MG/ML IJ SOSY
PREFILLED_SYRINGE | INTRAMUSCULAR | Status: AC
  Filled 2016-05-19: qty 10

## 2016-05-19 MED ORDER — STERILE WATER FOR IRRIGATION IR SOLN
Status: DC | PRN
  Administered 2016-05-19: 2.5 mL

## 2016-05-19 MED ORDER — LIDOCAINE VISCOUS 2 % MT SOLN
OROMUCOSAL | Status: DC | PRN
  Administered 2016-05-19: 1 via OROMUCOSAL

## 2016-05-19 MED ORDER — MEPERIDINE HCL 100 MG/ML IJ SOLN
INTRAMUSCULAR | Status: DC | PRN
  Administered 2016-05-19: 25 mg via INTRAVENOUS

## 2016-05-19 MED ORDER — METOPROLOL TARTRATE 5 MG/5ML IV SOLN
INTRAVENOUS | Status: DC | PRN
  Administered 2016-05-19: 2.5 mg via INTRAVENOUS

## 2016-05-19 MED ORDER — PANTOPRAZOLE SODIUM 40 MG PO TBEC
40.0000 mg | DELAYED_RELEASE_TABLET | Freq: Two times a day (BID) | ORAL | Status: DC
  Administered 2016-05-19 – 2016-05-20 (×2): 40 mg via ORAL
  Filled 2016-05-19 (×2): qty 1

## 2016-05-19 MED ORDER — SODIUM CHLORIDE 0.9 % IJ SOLN
PREFILLED_SYRINGE | INTRAMUSCULAR | Status: DC | PRN
  Administered 2016-05-19: 4 mL

## 2016-05-19 MED ORDER — VITAMIN K1 10 MG/ML IJ SOLN
3.0000 mg | Freq: Every day | INTRAMUSCULAR | Status: AC
  Administered 2016-05-19: 3 mg via INTRAVENOUS
  Filled 2016-05-19: qty 0.3

## 2016-05-19 MED ORDER — METOPROLOL TARTRATE 5 MG/5ML IV SOLN
INTRAVENOUS | Status: AC
  Filled 2016-05-19: qty 5

## 2016-05-19 MED ORDER — MIDAZOLAM HCL 5 MG/5ML IJ SOLN
INTRAMUSCULAR | Status: AC
  Filled 2016-05-19: qty 10

## 2016-05-19 MED ORDER — ALLOPURINOL 300 MG PO TABS
450.0000 mg | ORAL_TABLET | Freq: Every day | ORAL | Status: DC
  Administered 2016-05-19: 450 mg via ORAL
  Filled 2016-05-19: qty 2

## 2016-05-19 MED ORDER — MEPERIDINE HCL 100 MG/ML IJ SOLN
INTRAMUSCULAR | Status: AC
  Filled 2016-05-19: qty 2

## 2016-05-19 NOTE — H&P (Addendum)
Primary Care Physician:  [name], MD Primary Gastroenterologist:  Dr. Oneida Alar  Pre-Procedure History & Physical: HPI:  Douglas [name] is a 82 y.o. male here for [place]. NO NAUSEA, VOMITING,  CHEST PAIN, OR ABDOMINAL PAIN.   Past Medical History:  Diagnosis Date  . Aneurysm of thoracic aorta (HCC)    Ascending. 5.7 x 5.5cm - Bentall procedure  . Aortic stenosis    Bioprosthetic AVR  . Arthritis   . Atrial fibrillation ([place])    Documented on pacer interrogation 6/13  . Complete heart block ([place])    Status post pacemaker placement  . Coronary atherosclerosis of native coronary artery    Multivessel - LIMA to LAD, SVG to OM, SVG to PDA  . DJD (degenerative joint disease)   . Esophageal dysmotility   . Essential hypertension, benign   . Fibromyalgia   . Gout   . H/O hiatal hernia   . History of skin cancer   . Hx of adenomatous colonic polyps   . PMR (polymyalgia rheumatica) (HCC)     Past Surgical History:  Procedure Laterality Date  . AORTIC VALVE REPLACEMENT  01/24/12  . APPENDECTOMY    . [place] or 1966 lumbar back surgery  . BENTALL PROCEDURE  01/24/2012   Procedure: BENTALL PROCEDURE;  Surgeon: Ivin Poot, MD;  Location: [facility];  Service: Open Heart Surgery;  Laterality: N/A;  . BILATERAL KNEE ARTHROSCOPY    . CORONARY ARTERY BYPASS GRAFT  01/24/2012   Procedure: CORONARY ARTERY BYPASS GRAFTING (CABG);  Surgeon: Ivin Poot, MD;  Location: [facility];  Service: Open Heart Surgery;  Laterality: N/A;  . ESOPHAGOGASTRODUODENOSCOPY  06/17/2012   Procedure: ESOPHAGOGASTRODUODENOSCOPY (EGD);  Surgeon: Rogene Houston, MD;  [facility];  Service: Endoscopy;  Laterality: N/A;  ED  . HEMORROIDECTOMY    . HERNIA REPAIR    . Left inguinal hernia repair    . PACEMAKER PLACEMENT     [employer] 4/13 - Dr. Rayann Heman  . PERMANENT PACEMAKER INSERTION N/A 02/01/2012   Procedure: PERMANENT PACEMAKER INSERTION;  Surgeon: Thompson Grayer, MD; [place] 1 (serial number [id number])      Prior to Admission medications   Medication Sig Start Date End Date Taking? Authorizing Provider  allopurinol (ZYLOPRIM) 300 MG tablet Take 450 mg by mouth daily. Take 1 1/2 daily   Yes Historical Provider, MD  furosemide (LASIX) 40 MG tablet TAKE 1 AND 1/2 TABLETS DAILY. 03/26/16  Yes Satira Sark, MD  metoprolol succinate (TOPROL-XL) 25 MG 24 hr tablet TAKE (2) TABLETS BY MOUTH DAILY. 01/25/16  Yes Evans Lance, MD  pantoprazole (PROTONIX) 40 MG tablet TAKE ONE TABLET BY MOUTH ONCE DAILY IN THE MORNING. 11/24/15  Yes Satira Sark, MD  pravastatin (PRAVACHOL) 20 MG tablet Take 20 mg by mouth daily.  10/27/15  Yes Historical Provider, MD  warfarin (COUMADIN) 5 MG tablet Take 1 tablet (5 mg total) by mouth one time only at 6 PM. 05/30/15  Yes Satira Sark, MD    Allergies as of 05/18/2016 - Review Complete 05/18/2016  Allergen Reaction Noted  . Imdur [isosorbide nitrate] Other (See Comments) 12/28/2015    Family History  Problem Relation Age of Onset  . Kidney disease Mother   . Heart disease Father   . Anesthesia problems Neg Hx   . Hypotension Neg Hx   . Malignant hyperthermia Neg Hx   .  Pseudochol deficiency Neg Hx     Social History   Social History  . Marital status: Married    Spouse name: N/A  . Number of children: 4  . Years of education: N/A   Occupational History  . Retired     Scientist, forensic   Social History Main Topics  . Smoking status: Never Smoker  . Smokeless tobacco: Never Used  . Alcohol use No  . Drug use: No  . Sexual activity: No   Other Topics Concern  . Not on file   Social History Narrative   Lives with wife, has been married since 65.    Review of Systems: See HPI, otherwise negative ROS   Physical Exam: BP (!) 111/50   Pulse 70   Temp 98.7 F (37.1 C) (Oral)   Resp (!) 22   Ht 5\' 6"  (1.676 m)   Wt 212 lb 8.4 oz (96.4 kg)   SpO2 99%   BMI  34.30 kg/m  General:   Alert,  pleasant and cooperative in NAD Abdomen:  Soft, nontender and nondistended. Normal bowel sounds, without guarding, and without rebound.   Neurologic:  Alert and  oriented x4;  grossly normal neurologically.  Impression/Plan:     MELENA  PLAn: 1. EGD TODAY. NO B POS BLOOD ON HAND BUT "PLENTY OF O POS".

## 2016-05-19 NOTE — Plan of Care (Addendum)
REVIEWED labs. INR > 2.5 IN PT WITH AFIB/AVR. VITAMIN K 3 MG IV x1. REPEAT INR @1000 . PLAN EGD IN INR < 2.5 WITHOUT BIOPSY AND CAN INTERVENE ON AN ULCER OR AVM. Discussed with pt, wife, and family.

## 2016-05-19 NOTE — Progress Notes (Signed)
Subjective: He denies any signs of bleeding overnight. He does not feel lightheaded while lying in bed. He received 2 units of packed red cells without complication.  Objective: Vital signs in last 24 hours: Vitals:   05/19/16 0545 05/19/16 0600 05/19/16 0615 05/19/16 0742  BP: (!) 116/50 (!) 120/58 (!) 118/54   Pulse: 70 70 68   Resp: 20 18 17    Temp:    97.6 F (36.4 C)  TempSrc:    Oral  SpO2: 100% 99% 99%   Weight:      Height:       Weight change:   Intake/Output Summary (Last 24 hours) at 05/19/16 0743 Last data filed at 05/19/16 0742  Gross per 24 hour  Intake             1040 ml  Output             2300 ml  Net            -1260 ml    Physical Exam: No distress. Alert. Color improved. Lungs clear. Heart regular with a grade 1 systolic murmur. Abdomen nontender with no hepatosplenomegaly. Extremities reveal no edema.  Lab Results:    Results for orders placed or performed during the hospital encounter of 05/18/16 (from the past 24 hour(s))  Type and screen     Status: None (Preliminary result)   Collection Time: 05/18/16  1:40 PM  Result Value Ref Range   ABO/RH(D) B POS    Antibody Screen NEG    Sample Expiration 05/21/2016    Unit Number [id number]    Blood Component Type RED CELLS,LR    Unit division 00    Status of Unit ISSUED    Transfusion Status OK TO TRANSFUSE    Crossmatch Result Compatible    Unit Number [id number]    Blood Component Type RED CELLS,LR    Unit division 00    Status of Unit ISSUED    Transfusion Status OK TO TRANSFUSE    Crossmatch Result Compatible   Protime-INR     Status: Abnormal   Collection Time: 05/18/16  1:40 PM  Result Value Ref Range   Prothrombin Time 32.3 (H) 11.4 - 15.2 seconds   INR 3.06   Hemoglobin and hematocrit, blood     Status: Abnormal   Collection Time: 05/18/16  1:40 PM  Result Value Ref Range   Hemoglobin 7.1 (L) 13.0 - 17.0 g/dL   HCT 23.0 (L) 39.0 - 52.0 %  Prepare RBC     Status: None   Collection Time: 05/18/16  1:40 PM  Result Value Ref Range   Order Confirmation ORDER PROCESSED BY BLOOD BANK   ABO/Rh     Status: None   Collection Time: 05/18/16  1:48 PM  Result Value Ref Range   ABO/RH(D) B POS   MRSA PCR Screening     Status: None   Collection Time: 05/18/16  2:52 PM  Result Value Ref Range   MRSA by PCR NEGATIVE NEGATIVE  Hemoglobin and hematocrit, blood     Status: Abnormal   Collection Time: 05/19/16 12:16 AM  Result Value Ref Range   Hemoglobin 8.4 (L) 13.0 - 17.0 g/dL   HCT 25.7 (L) 39.0 - 52.0 %  CBC     Status: Abnormal   Collection Time: 05/19/16  4:39 AM  Result Value Ref Range   WBC 10.5 4.0 - 10.5 K/uL   RBC 3.20 (L) 4.22 - 5.81 MIL/uL   Hemoglobin 8.6 (L)  13.0 - 17.0 g/dL   HCT 26.8 (L) 39.0 - 52.0 %   MCV 83.8 78.0 - 100.0 fL   MCH 26.9 26.0 - 34.0 pg   MCHC 32.1 30.0 - 36.0 g/dL   RDW 15.7 (H) 11.5 - 15.5 %   Platelets 172 150 - 400 K/uL  Protime-INR     Status: Abnormal   Collection Time: 05/19/16  4:39 AM  Result Value Ref Range   Prothrombin Time 30.8 (H) 11.4 - 15.2 seconds   INR 2.88      ABGS No results for input(s): PHART, PO2ART, TCO2, HCO3 in the last 72 hours.  Invalid input(s): PCO2 CULTURES Recent Results (from the past 240 hour(s))  MRSA PCR Screening     Status: None   Collection Time: 05/18/16  2:52 PM  Result Value Ref Range Status   MRSA by PCR NEGATIVE NEGATIVE Final    Comment:        The GeneXpert MRSA Assay (FDA approved for NASAL specimens only), is one component of a comprehensive MRSA colonization surveillance program. It is not intended to diagnose MRSA infection nor to guide or monitor treatment for MRSA infections.    Studies/Results: No results found. Micro Results: Recent Results (from the past 240 hour(s))  MRSA PCR Screening     Status: None   Collection Time: 05/18/16  2:52 PM  Result Value Ref Range Status   MRSA by PCR NEGATIVE NEGATIVE Final    Comment:        The GeneXpert MRSA  Assay (FDA approved for NASAL specimens only), is one component of a comprehensive MRSA colonization surveillance program. It is not intended to diagnose MRSA infection nor to guide or monitor treatment for MRSA infections.    Studies/Results: No results found. Medications:  I have reviewed the patient's current medications Scheduled Meds: . sodium chloride   Intravenous Once  . sodium chloride   Intravenous Once  . pantoprazole (PROTONIX) IV  40 mg Intravenous Q12H  . sodium chloride flush  3 mL Intravenous Q12H   Continuous Infusions: . sodium chloride     PRN Meds:.acetaminophen **OR** acetaminophen, ondansetron **OR** ondansetron (ZOFRAN) IV   Assessment/Plan: #1. Upper GI bleed/melena. Hemoglobin has improved 8.6 with 2 units. Hemodynamically stable. Upper endoscopy planned for later this morning. Continue Protonix. #2. PAF. Coumadin held. INR down to 2.8. Active Problems:   Acute upper GI bleed   Melena   Symptomatic anemia   Hypotension   History of esophageal ulcer   H/O arteriovenous malformation (AVM)     LOS: 1 day   Cael Worth 05/19/2016, 7:43 AM

## 2016-05-19 NOTE — H&P (Signed)
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 Douglas [name] had driven to [place] and back 1 day before admission. Upon returning home Douglas [name] felt weak and lightheaded. On the morning of admission Douglas [name] had black tarry stools. Douglas [name] was seen in the office and had a low normal blood pressure and obviously appeared quite weak and pale. Douglas [name] is anticoagulated with Coumadin for paroxysmal atrial fibrillation. Douglas [name] has had an aortic valve replacement and thoracic aortic aneurysm repair. Douglas [name] has a history of a gastric ulcer in 2013. Douglas [name] is on pantoprazole chronically. Douglas [name] denies any nonsteroidal use. Douglas [name] does not use alcohol.    Past Medical History:  Diagnosis Date  . Aneurysm of thoracic aorta (HCC)    Ascending. 5.7 x 5.5cm - Bentall procedure  . Aortic stenosis    Bioprosthetic AVR  . Arthritis   . Atrial fibrillation ([place])    Documented on pacer interrogation 6/13  . Complete heart block ([place])    Status post pacemaker placement  . Coronary atherosclerosis of native coronary artery    Multivessel - LIMA to LAD, SVG to OM, SVG to PDA  . DJD (degenerative joint disease)   . Esophageal dysmotility   . Essential hypertension, benign   . Fibromyalgia   . Gout   . H/O hiatal hernia   . History of skin cancer   . Hx of adenomatous colonic polyps   . PMR (polymyalgia rheumatica) (HCC)      Past Surgical History:  Procedure Laterality Date  . AORTIC VALVE REPLACEMENT  01/24/12  . APPENDECTOMY    . [place] or 1966 lumbar back surgery  . BENTALL PROCEDURE  01/24/2012   Procedure: BENTALL PROCEDURE;  Surgeon: Ivin Poot, MD;  Location: [facility];  Service: Open Heart Surgery;  Laterality: N/A;  . BILATERAL KNEE ARTHROSCOPY    . CORONARY ARTERY BYPASS GRAFT  01/24/2012   Procedure: CORONARY ARTERY BYPASS GRAFTING (CABG);  Surgeon:  Ivin Poot, MD;  Location: [facility];  Service: Open Heart Surgery;  Laterality: N/A;  . ESOPHAGOGASTRODUODENOSCOPY  06/17/2012   Procedure: ESOPHAGOGASTRODUODENOSCOPY (EGD);  Surgeon: Rogene Houston, MD;  [facility];  Service: Endoscopy;  Laterality: N/A;  ED  . HEMORROIDECTOMY    . HERNIA REPAIR    . Left inguinal hernia repair    . PACEMAKER PLACEMENT     [employer]tr[employer] 4/13 - Dr. Rayann Heman  . PERMANENT PACEMAKER INSERTION N/A 02/01/2012   Procedure: PERMANENT PACEMAKER INSERTION;  Surgeon: Thompson Grayer, MD; [place] 1 (serial number [id number])       Prior to Admission medications   Medication Sig Start Date End Date Taking? Authorizing Provider  allopurinol (ZYLOPRIM) 300 MG tablet Take 450 mg by mouth daily. Take 1 1/2 daily   Yes Historical Provider, MD  furosemide (LASIX) 40 MG tablet TAKE 1 AND 1/2 TABLETS DAILY. 03/26/16  Yes Satira Sark, MD  metoprolol succinate (TOPROL-XL) 25 MG 24 hr tablet TAKE (2) TABLETS BY MOUTH DAILY. 01/25/16  Yes Evans Lance, MD  pantoprazole (PROTONIX) 40 MG tablet TAKE ONE TABLET BY MOUTH ONCE DAILY IN THE MORNING. 11/24/15  Yes Satira Sark, MD  pravastatin (PRAVACHOL) 20 MG tablet Take  20 mg by mouth daily.  10/27/15  Yes Historical Provider, MD  warfarin (COUMADIN) 5 MG tablet Take 1 tablet (5 mg total) by mouth one time only at 6 PM. 05/30/15  Yes Satira Sark, MD      Allergies:   Allergies  Allergen Reactions  . Imdur [Isosorbide Nitrate] Other (See Comments)    Nausea and lightheaded      Social History:  reports that Douglas [name] has never smoked. Douglas [name] has never used smokeless tobacco. Douglas [name] reports that Douglas [name] does not drink alcohol or use drugs.  Family History  Problem Relation Age of Onset  . Kidney disease Mother   . Heart disease Father   . Anesthesia problems Neg Hx   . Hypotension Neg Hx   . Malignant hyperthermia Neg Hx   . Pseudochol deficiency Neg Hx     Review of Systems: No chest pain, syncope,  abdominal pain, vomiting, bright red rectal bleeding, difficulty voiding, weight loss or difficulty eating until the day of admission. @NORROS @  Physical Exam: Blood pressure (!) 118/54, pulse 68, temperature 97.6 F (36.4 C), temperature source Oral, resp. rate 17, height 5\' 6"  (1.676 m), weight 212 lb 8.4 oz (96.4 kg), SpO2 99 %. Pale. Alert. Weak appearing. HEENT: No scleral icterus. Pharynx moist. Neck reveals no JVD, thyromegaly or lymphadenopathy. Lungs clear. Heart regular with a grade 1 systolic murmur. Abdomen nontender and nondistended. No hepatosplenomegaly or palpable mass. Rectal exam reveals no palpable mass. Douglas [name] is Hemoccult positive with a black-appearing stool. Extremities feel normal pulses with no edema. Neuro exam reveals no focal deficit. Skin is warm and dry.  Labs on Admission:  Results for orders placed or performed during the hospital encounter of 05/18/16 (from the past 48 hour(s))  Type and screen     Status: None (Preliminary result)   Collection Time: 05/18/16  1:40 PM  Result Value Ref Range   ABO/RH(D) B POS    Antibody Screen NEG    Sample Expiration 05/21/2016    Unit Number [id number]    Blood Component Type RED CELLS,LR    Unit division 00    Status of Unit ISSUED    Transfusion Status OK TO TRANSFUSE    Crossmatch Result Compatible    Unit Number [id number]    Blood Component Type RED CELLS,LR    Unit division 00    Status of Unit ISSUED    Transfusion Status OK TO TRANSFUSE    Crossmatch Result Compatible   Protime-INR     Status: Abnormal   Collection Time: 05/18/16  1:40 PM  Result Value Ref Range   Prothrombin Time 32.3 (H) 11.4 - 15.2 seconds   INR 3.06   Hemoglobin and hematocrit, blood     Status: Abnormal   Collection Time: 05/18/16  1:40 PM  Result Value Ref Range   Hemoglobin 7.1 (L) 13.0 - 17.0 g/dL   HCT 23.0 (L) 39.0 - 52.0 %  Prepare RBC     Status: None   Collection Time: 05/18/16  1:40 PM  Result Value Ref Range   Order  Confirmation ORDER PROCESSED BY BLOOD BANK   ABO/Rh     Status: None   Collection Time: 05/18/16  1:48 PM  Result Value Ref Range   ABO/RH(D) B POS   MRSA PCR Screening     Status: None   Collection Time: 05/18/16  2:52 PM  Result Value Ref Range   MRSA by PCR NEGATIVE NEGATIVE    Comment:  The GeneXpert MRSA Assay (FDA approved for NASAL specimens only), is one component of a comprehensive MRSA colonization surveillance program. It is not intended to diagnose MRSA infection nor to guide or monitor treatment for MRSA infections.   Hemoglobin and hematocrit, blood     Status: Abnormal   Collection Time: 05/19/16 12:16 AM  Result Value Ref Range   Hemoglobin 8.4 (L) 13.0 - 17.0 g/dL   HCT 25.7 (L) 39.0 - 52.0 %  CBC     Status: Abnormal   Collection Time: 05/19/16  4:39 AM  Result Value Ref Range   WBC 10.5 4.0 - 10.5 K/uL   RBC 3.20 (L) 4.22 - 5.81 MIL/uL   Hemoglobin 8.6 (L) 13.0 - 17.0 g/dL   HCT 26.8 (L) 39.0 - 52.0 %   MCV 83.8 78.0 - 100.0 fL   MCH 26.9 26.0 - 34.0 pg   MCHC 32.1 30.0 - 36.0 g/dL   RDW 15.7 (H) 11.5 - 15.5 %   Platelets 172 150 - 400 K/uL  Protime-INR     Status: Abnormal   Collection Time: 05/19/16  4:39 AM  Result Value Ref Range   Prothrombin Time 30.8 (H) 11.4 - 15.2 seconds   INR 2.88     Radiological Exams on Admission: No results found.  Assessment/Plan Active Problems:   Acute upper GI bleed   Melena   Symptomatic anemia   Hypotension   History of esophageal ulcer   H/O arteriovenous malformation (AVM)  #1. Upper GI bleed. Douglas [name] has been hospitalized in a stepdown bed. Douglas [name] is hemodynamically stable. Coumadin is being stopped. Treat with IV Protonix. Obtain GI consult. Case discussed with Dr. Oneida Alar. Transfuse with 2 units of packed red cells for hemoglobin of 7.1. #2. PAF. Hold Coumadin. Rate controlled. Hold metoprolol for now. #3. Status post aortic valve replacement and thoracic aortic aneurysm repair. #4. Permanent  pacemaker in place. #5. History of gastric ulcer  Metabolic profile and CBC were obtained prior to his office visit but these have not returned at this point but will be found this morning.  Douglas [name]    05/19/2016

## 2016-05-19 NOTE — Plan of Care (Signed)
Problem: Fluid Volume: Goal: Will show no signs and symptoms of excessive bleeding Outcome: Progressing PT HAS RECEIVED 2UNITS PRBC. HGB NOW 8.4

## 2016-05-19 NOTE — Op Note (Addendum)
[facility] Patient Name: Douglas [name] Procedure Date: 05/19/2016 11:15 AM MRN: [id number] Date of Birth: 04-06-1928 Attending MD: Barney Drain , MD CSN: [id number] Age: 82 Admit Type: Inpatient Procedure:                Upper GI endoscopy, DIAGNOSTIC WITH APC/EPI                            INJECTION/CLIPx3 Indications:              Acute post hemorrhagic anemia, Melena Providers:                Barney Drain, MD, Janeece Riggers, RN, Lurline Del, RN Referring MD:             [name], Hildred Laser, MD Medicines:                Meperidine 25 mg IV, Midazolam 2 mg IV Complications:            No immediate complications. Estimated Blood Loss:     Estimated blood loss was minimal. Procedure:                Pre-Anesthesia Assessment:                           - Prior to the procedure, a History and Physical                            was performed, and patient medications and                            allergies were reviewed. The patient's tolerance of                            previous anesthesia was also reviewed. The risks                            and benefits of the procedure and the sedation                            options and risks were discussed with the patient.                            All questions were answered, and informed consent                            was obtained. Prior Anticoagulants: The patient has                            taken Coumadin (warfarin), last dose was day of                            procedure. ASA Grade Assessment: III - A patient                            with severe systemic disease. After reviewing the  risks and benefits, the patient was deemed in                            satisfactory condition to undergo the procedure.                            After obtaining informed consent, the endoscope was                            passed under direct vision. Throughout the                            procedure, the  patient's blood pressure, pulse, and                            oxygen saturations were monitored continuously. The                            [id number]) scope was introduced through the                            mouth, and advanced to the second part of duodenum.                            The upper GI endoscopy was somewhat difficult due                            to excessive bleeding. Successful completion of the                            procedure was aided by controlling the bleeding.                            The patient tolerated the procedure well. Scope In: 11:47:05 AM Scope Out: 12:10:21 PM Total Procedure Duration: 0 hours 23 minutes 16 seconds  Findings:      The examined esophagus was normal.      Red blood was found on the lesser curvature of the stomach. Area was       successfully injected with 4 mL of a 1:10,000 solution of epinephrine       for hemostasis. Estimated blood loss was minimal. Coagulation for       hemostasis using argon plasma at 0.8 liters/minute and 20 watts was       successful. To prevent bleeding post-intervention, three hemostatic       clips were successfully placed (MR conditional). There was no bleeding       at the end of the procedure.      The examined duodenum was normal. Impression:               - UGIB/MELENA DUE TO LARGE GASTRIC AVM Moderate Sedation:      Moderate (conscious) sedation was administered by the endoscopy nurse       and supervised by the endoscopist. The following parameters were       monitored: oxygen saturation, heart rate, blood pressure, and response  to care. Total physician intraservice time was 31 minutes. Recommendation:           - Return patient to hospital ward for ongoing care.                           - Cardiac diet.                           - Use Protonix (pantoprazole) 40 mg PO BID.                           - Resume Coumadin (warfarin) at prior dose tomorrow.                           -  Return to GI office-DR. REHMAN in 2 months. Procedure Code(s):        --- Professional ---                           [id number], Esophagogastroduodenoscopy, flexible,                            transoral; with control of bleeding, any method                           99152, Moderate sedation services provided by the                            same physician or other qualified health care                            professional performing the diagnostic or                            therapeutic service that the sedation supports,                            requiring the presence of an independent trained                            observer to assist in the monitoring of the                            patient's level of consciousness and physiological                            status; initial 15 minutes of intraservice time,                            patient age 38 years or older                           [phone], Moderate sedation services; each additional                            15 minutes intraservice  time Diagnosis Code(s):        --- Professional ---                           K92.2, Gastrointestinal hemorrhage, unspecified                           D62, Acute posthemorrhagic anemia                           K92.1, Melena (includes Hematochezia) CPT copyright 2016 [employer]. All rights reserved. The codes documented in this report are preliminary and upon coder review may  be revised to meet current compliance requirements. Barney Drain, MD Barney Drain, MD 05/19/2016 12:28:10 PM This report has been signed electronically. Number of Addenda: 2 Addendum Number: 1   Addendum Date: 05/19/2016 12:31:13 PM                            Barney Drain, MD Barney Drain, MD 05/19/2016 12:31:19 PM This report has been signed electronically. Addendum Number: 2   Addendum Date: 05/19/2016 12:31:29 PM      NO MRI FOR 30 DAYS DUE TO METAL CLIP PLACEMENT IN STOMACH. Barney Drain,  MD Barney Drain, MD 05/19/2016 12:54:13 PM This report has been signed electronically.

## 2016-05-20 DIAGNOSIS — K5521 Angiodysplasia of colon with hemorrhage: Secondary | ICD-10-CM | POA: Diagnosis not present

## 2016-05-20 DIAGNOSIS — K922 Gastrointestinal hemorrhage, unspecified: Secondary | ICD-10-CM | POA: Diagnosis not present

## 2016-05-20 DIAGNOSIS — Z8679 Personal history of other diseases of the circulatory system: Secondary | ICD-10-CM | POA: Diagnosis not present

## 2016-05-20 DIAGNOSIS — I10 Essential (primary) hypertension: Secondary | ICD-10-CM | POA: Diagnosis not present

## 2016-05-20 DIAGNOSIS — I959 Hypotension, unspecified: Secondary | ICD-10-CM | POA: Diagnosis not present

## 2016-05-20 DIAGNOSIS — K921 Melena: Secondary | ICD-10-CM | POA: Diagnosis not present

## 2016-05-20 DIAGNOSIS — I480 Paroxysmal atrial fibrillation: Secondary | ICD-10-CM | POA: Diagnosis not present

## 2016-05-20 DIAGNOSIS — D62 Acute posthemorrhagic anemia: Secondary | ICD-10-CM | POA: Diagnosis not present

## 2016-05-20 DIAGNOSIS — Z951 Presence of aortocoronary bypass graft: Secondary | ICD-10-CM | POA: Diagnosis not present

## 2016-05-20 DIAGNOSIS — D649 Anemia, unspecified: Secondary | ICD-10-CM | POA: Diagnosis not present

## 2016-05-20 LAB — CBC
HEMATOCRIT: 26 % — AB (ref 39.0–52.0)
HEMOGLOBIN: 7.9 g/dL — AB (ref 13.0–17.0)
MCH: 27 pg (ref 26.0–34.0)
MCHC: 30.4 g/dL (ref 30.0–36.0)
MCV: 88.7 fL (ref 78.0–100.0)
Platelets: 166 10*3/uL (ref 150–400)
RBC: 2.93 MIL/uL — ABNORMAL LOW (ref 4.22–5.81)
RDW: 15.7 % — AB (ref 11.5–15.5)
WBC: 9.3 10*3/uL (ref 4.0–10.5)

## 2016-05-20 LAB — BASIC METABOLIC PANEL
Anion gap: 4 — ABNORMAL LOW (ref 5–15)
BUN: 38 mg/dL — AB (ref 6–20)
CHLORIDE: 112 mmol/L — AB (ref 101–111)
CO2: 26 mmol/L (ref 22–32)
CREATININE: 1.22 mg/dL (ref 0.61–1.24)
Calcium: 8.3 mg/dL — ABNORMAL LOW (ref 8.9–10.3)
GFR calc Af Amer: >60 mL/min (ref >60)
GFR calc non Af Amer: 52 mL/min — ABNORMAL LOW (ref >60)
GLUCOSE: 112 mg/dL — AB (ref 65–99)
Potassium: 3.9 mmol/L (ref 3.5–5.1)
Sodium: 142 mmol/L (ref 135–145)

## 2016-05-20 LAB — PROTIME-INR
INR: 1.45
Prothrombin Time: 17.8 seconds — ABNORMAL HIGH (ref 11.4–15.2)

## 2016-05-20 MED ORDER — FERROUS SULFATE 325 (65 FE) MG PO TABS: 325.0000 mg | ORAL_TABLET | Freq: Two times a day (BID) | ORAL | Status: DC

## 2016-05-20 MED ORDER — FERROUS SULFATE 325 (65 FE) MG PO TABS
325.0000 mg | ORAL_TABLET | Freq: Two times a day (BID) | ORAL | Status: DC
  Administered 2016-05-20: 325 mg via ORAL
  Filled 2016-05-20: qty 1

## 2016-05-20 MED ORDER — FERROUS SULFATE 325 (65 FE) MG PO TABS: 325.0000 mg | ORAL_TABLET | Freq: Two times a day (BID) | ORAL | 3 refills | Status: DC

## 2016-05-20 NOTE — Discharge Summary (Signed)
Physician Discharge Summary  Douglas [name] [id number] DOB: [birth year] DOA: 05/18/2016   Admit date: 05/18/2016 Discharge date: 05/20/2016  Discharge Diagnoses:  Active Problems:   Acute upper GI bleed   Melena   Symptomatic anemia   Hypotension   History of esophageal ulcer   H/O arteriovenous malformation (AVM)    Wt Readings from Last 3 Encounters:  05/20/16 212 lb 8.4 oz (96.4 kg)  03/30/16 215 lb (97.5 kg)  02/09/16 217 lb (98.4 kg)     Hospital Course:  This patient is an 82 year old male who presented with weakness and melena. He was anemic with a hemoglobin of 7.1. He remained hemodynamically stable. He was hospitalized in the step down unit. He was transfused with 2 units of packed red cells with an improvement of his hemoglobin to 8.6. He was treated empirically with pantoprazole intravenously. Coumadin was held. INR was 3. He was seen in gastroenterology consultation by Dr. Oneida Alar and underwent an upper endoscopy on 7/29 revealing a large gastric arteriovenous malformation. This was treated with 3 clips.  Observation over the next night revealed no complications. He remained hemodynamically stable but had a drop in his hemoglobin to 7.9. He is not feeling weak or lightheaded now it is felt to be stable for discharge on the morning of 7/30. Coumadin will be restarted. INR is down to 1.4. He received vitamin K yesterday. BUN and creatinine were initially 62 and 1.4 and have improved with hydration to a creatinine of 1.2. Lasix and metoprolol have been temporarily held.  He'll be started on iron.  Condition at discharge is much improved. He is eating without difficulty. Discharge exam reveals that he is alert and oriented. Lungs clear. Heart regular and paced at 70 bpm. Abdomen is nontender with no hepatosplenomegaly.  The patient will be seen in follow-up in one week with a follow-up hemoglobin. Coumadin is being restarted at his usual dose. He'll have repeat monitoring of  his anticoagulation through the cardiology Coumadin clinic. He will cautiously increase activity as tolerated.   Discharge Instructions     Medication List    TAKE these medications   allopurinol 300 MG tablet Commonly known as:  ZYLOPRIM Take 450 mg by mouth daily. Take 1 1/2 daily   ferrous sulfate 325 (65 FE) MG tablet Take 1 tablet (325 mg total) by mouth 2 (two) times daily with a meal.   furosemide 40 MG tablet Commonly known as:  LASIX TAKE 1 AND 1/2 TABLETS DAILY.   metoprolol succinate 25 MG 24 hr tablet Commonly known as:  TOPROL-XL TAKE (2) TABLETS BY MOUTH DAILY.   pantoprazole 40 MG tablet Commonly known as:  PROTONIX TAKE ONE TABLET BY MOUTH ONCE DAILY IN THE MORNING.   pravastatin 20 MG tablet Commonly known as:  PRAVACHOL Take 20 mg by mouth daily.   warfarin 5 MG tablet Commonly known as:  COUMADIN Take 1 tablet (5 mg total) by mouth one time only at 6 PM.        Jerrie Schussler 05/20/2016

## 2016-05-20 NOTE — Progress Notes (Signed)
Dr. Willey Blade discharged patient. Dressed himself and was able to walk about 75 feet without difficulty. IV access removed and telemetry removed. Will go home with family and car rolled down by wheelchair.

## 2016-05-22 ENCOUNTER — Encounter (HOSPITAL_COMMUNITY): Payer: Self-pay | Admitting: Gastroenterology

## 2016-05-24 DIAGNOSIS — D649 Anemia, unspecified: Secondary | ICD-10-CM | POA: Diagnosis not present

## 2016-05-25 DIAGNOSIS — D649 Anemia, unspecified: Secondary | ICD-10-CM | POA: Diagnosis not present

## 2016-05-25 DIAGNOSIS — K922 Gastrointestinal hemorrhage, unspecified: Secondary | ICD-10-CM | POA: Diagnosis not present

## 2016-05-30 ENCOUNTER — Ambulatory Visit (INDEPENDENT_AMBULATORY_CARE_PROVIDER_SITE_OTHER): Payer: Medicare Other | Admitting: *Deleted

## 2016-05-30 DIAGNOSIS — Z954 Presence of other heart-valve replacement: Secondary | ICD-10-CM | POA: Diagnosis not present

## 2016-05-30 DIAGNOSIS — Z7901 Long term (current) use of anticoagulants: Secondary | ICD-10-CM

## 2016-05-30 DIAGNOSIS — I4891 Unspecified atrial fibrillation: Secondary | ICD-10-CM | POA: Diagnosis not present

## 2016-05-30 DIAGNOSIS — Z952 Presence of prosthetic heart valve: Secondary | ICD-10-CM

## 2016-05-30 LAB — POCT INR: INR: 2.4

## 2016-05-31 DIAGNOSIS — D649 Anemia, unspecified: Secondary | ICD-10-CM | POA: Diagnosis not present

## 2016-06-20 ENCOUNTER — Ambulatory Visit (INDEPENDENT_AMBULATORY_CARE_PROVIDER_SITE_OTHER): Payer: Medicare Other | Admitting: *Deleted

## 2016-06-20 DIAGNOSIS — I4891 Unspecified atrial fibrillation: Secondary | ICD-10-CM

## 2016-06-20 DIAGNOSIS — Z954 Presence of other heart-valve replacement: Secondary | ICD-10-CM | POA: Diagnosis not present

## 2016-06-20 DIAGNOSIS — Z7901 Long term (current) use of anticoagulants: Secondary | ICD-10-CM

## 2016-06-20 DIAGNOSIS — Z952 Presence of prosthetic heart valve: Secondary | ICD-10-CM

## 2016-06-20 LAB — POCT INR: INR: 3.8

## 2016-06-27 ENCOUNTER — Other Ambulatory Visit: Payer: Self-pay | Admitting: Cardiology

## 2016-06-27 ENCOUNTER — Other Ambulatory Visit: Payer: Self-pay | Admitting: Internal Medicine

## 2016-07-02 ENCOUNTER — Ambulatory Visit (INDEPENDENT_AMBULATORY_CARE_PROVIDER_SITE_OTHER): Payer: Medicare Other | Admitting: *Deleted

## 2016-07-02 DIAGNOSIS — I442 Atrioventricular block, complete: Secondary | ICD-10-CM | POA: Diagnosis not present

## 2016-07-03 DIAGNOSIS — K922 Gastrointestinal hemorrhage, unspecified: Secondary | ICD-10-CM | POA: Diagnosis not present

## 2016-07-03 DIAGNOSIS — D649 Anemia, unspecified: Secondary | ICD-10-CM | POA: Diagnosis not present

## 2016-07-04 LAB — CUP PACEART REMOTE DEVICE CHECK
Battery Remaining Longevity: 99 mo
Battery Voltage: 2.78 V
Brady Statistic AP VP Percent: 68 %
Date Time Interrogation Session: 20170911105644
Implantable Lead Location: 753860
Lead Channel Pacing Threshold Amplitude: 0.5 V
Lead Channel Setting Pacing Pulse Width: 0.4 ms
Lead Channel Setting Sensing Sensitivity: 4 mV
MDC IDC LEAD IMPLANT DT: 20130412
MDC IDC LEAD IMPLANT DT: 20130412
MDC IDC LEAD LOCATION: 753859
MDC IDC MSMT BATTERY IMPEDANCE: 329 Ohm
MDC IDC MSMT LEADCHNL RA IMPEDANCE VALUE: 465 Ohm
MDC IDC MSMT LEADCHNL RV IMPEDANCE VALUE: 554 Ohm
MDC IDC MSMT LEADCHNL RV PACING THRESHOLD PULSEWIDTH: 0.4 ms
MDC IDC SET LEADCHNL RA PACING AMPLITUDE: 2 V
MDC IDC SET LEADCHNL RV PACING AMPLITUDE: 2 V
MDC IDC STAT BRADY AP VS PERCENT: 0 %
MDC IDC STAT BRADY AS VP PERCENT: 32 %
MDC IDC STAT BRADY AS VS PERCENT: 0 %

## 2016-07-04 NOTE — Progress Notes (Signed)
Remote pacemaker transmission.   

## 2016-07-05 ENCOUNTER — Encounter: Payer: Self-pay | Admitting: Cardiology

## 2016-07-11 ENCOUNTER — Ambulatory Visit (INDEPENDENT_AMBULATORY_CARE_PROVIDER_SITE_OTHER): Payer: Medicare Other | Admitting: *Deleted

## 2016-07-11 DIAGNOSIS — Z954 Presence of other heart-valve replacement: Secondary | ICD-10-CM | POA: Diagnosis not present

## 2016-07-11 DIAGNOSIS — I4891 Unspecified atrial fibrillation: Secondary | ICD-10-CM

## 2016-07-11 DIAGNOSIS — Z952 Presence of prosthetic heart valve: Secondary | ICD-10-CM

## 2016-07-11 DIAGNOSIS — Z7901 Long term (current) use of anticoagulants: Secondary | ICD-10-CM | POA: Diagnosis not present

## 2016-07-11 LAB — POCT INR: INR: 3.6

## 2016-07-19 ENCOUNTER — Encounter: Payer: Self-pay | Admitting: Cardiology

## 2016-08-01 DIAGNOSIS — Z23 Encounter for immunization: Secondary | ICD-10-CM | POA: Diagnosis not present

## 2016-08-08 ENCOUNTER — Ambulatory Visit (INDEPENDENT_AMBULATORY_CARE_PROVIDER_SITE_OTHER): Payer: Medicare Other | Admitting: *Deleted

## 2016-08-08 DIAGNOSIS — I4891 Unspecified atrial fibrillation: Secondary | ICD-10-CM

## 2016-08-08 DIAGNOSIS — Z952 Presence of prosthetic heart valve: Secondary | ICD-10-CM

## 2016-08-08 LAB — POCT INR: INR: 3.6

## 2016-08-10 DIAGNOSIS — H18413 Arcus senilis, bilateral: Secondary | ICD-10-CM | POA: Diagnosis not present

## 2016-08-10 DIAGNOSIS — H43812 Vitreous degeneration, left eye: Secondary | ICD-10-CM | POA: Diagnosis not present

## 2016-08-10 DIAGNOSIS — H25813 Combined forms of age-related cataract, bilateral: Secondary | ICD-10-CM | POA: Diagnosis not present

## 2016-08-10 DIAGNOSIS — H35033 Hypertensive retinopathy, bilateral: Secondary | ICD-10-CM | POA: Diagnosis not present

## 2016-08-20 DIAGNOSIS — D509 Iron deficiency anemia, unspecified: Secondary | ICD-10-CM | POA: Diagnosis not present

## 2016-08-20 DIAGNOSIS — Z79899 Other long term (current) drug therapy: Secondary | ICD-10-CM | POA: Diagnosis not present

## 2016-08-27 ENCOUNTER — Other Ambulatory Visit: Payer: Self-pay | Admitting: Cardiology

## 2016-08-29 ENCOUNTER — Ambulatory Visit (INDEPENDENT_AMBULATORY_CARE_PROVIDER_SITE_OTHER): Payer: Medicare Other | Admitting: *Deleted

## 2016-08-29 DIAGNOSIS — I4891 Unspecified atrial fibrillation: Secondary | ICD-10-CM | POA: Diagnosis not present

## 2016-08-29 DIAGNOSIS — Z952 Presence of prosthetic heart valve: Secondary | ICD-10-CM | POA: Diagnosis not present

## 2016-08-29 LAB — POCT INR: INR: 2

## 2016-08-30 DIAGNOSIS — I5032 Chronic diastolic (congestive) heart failure: Secondary | ICD-10-CM | POA: Diagnosis not present

## 2016-08-30 DIAGNOSIS — D509 Iron deficiency anemia, unspecified: Secondary | ICD-10-CM | POA: Diagnosis not present

## 2016-08-30 DIAGNOSIS — N183 Chronic kidney disease, stage 3 (moderate): Secondary | ICD-10-CM | POA: Diagnosis not present

## 2016-09-17 ENCOUNTER — Ambulatory Visit (HOSPITAL_COMMUNITY): Payer: Medicare Other | Admitting: Oncology

## 2016-09-26 ENCOUNTER — Ambulatory Visit (INDEPENDENT_AMBULATORY_CARE_PROVIDER_SITE_OTHER): Payer: Medicare Other | Admitting: *Deleted

## 2016-09-26 DIAGNOSIS — Z952 Presence of prosthetic heart valve: Secondary | ICD-10-CM | POA: Diagnosis not present

## 2016-09-26 DIAGNOSIS — I25119 Atherosclerotic heart disease of native coronary artery with unspecified angina pectoris: Secondary | ICD-10-CM

## 2016-09-26 DIAGNOSIS — I4891 Unspecified atrial fibrillation: Secondary | ICD-10-CM

## 2016-09-26 LAB — POCT INR: INR: 3

## 2016-10-01 ENCOUNTER — Ambulatory Visit (INDEPENDENT_AMBULATORY_CARE_PROVIDER_SITE_OTHER): Payer: Medicare Other | Admitting: *Deleted

## 2016-10-01 DIAGNOSIS — I442 Atrioventricular block, complete: Secondary | ICD-10-CM | POA: Diagnosis not present

## 2016-10-02 NOTE — Progress Notes (Signed)
Remote pacemaker transmission.   

## 2016-10-03 ENCOUNTER — Encounter (HOSPITAL_COMMUNITY): Payer: Self-pay | Admitting: Hematology & Oncology

## 2016-10-03 ENCOUNTER — Encounter (HOSPITAL_COMMUNITY): Payer: Medicare Other | Attending: Hematology & Oncology | Admitting: Hematology & Oncology

## 2016-10-03 VITALS — BP 130/60 | HR 80 | Temp 98.7°F | Resp 18 | Ht 66.0 in | Wt 205.3 lb

## 2016-10-03 DIAGNOSIS — D500 Iron deficiency anemia secondary to blood loss (chronic): Secondary | ICD-10-CM | POA: Insufficient documentation

## 2016-10-03 DIAGNOSIS — K5900 Constipation, unspecified: Secondary | ICD-10-CM | POA: Diagnosis not present

## 2016-10-03 DIAGNOSIS — D649 Anemia, unspecified: Secondary | ICD-10-CM

## 2016-10-03 DIAGNOSIS — K31819 Angiodysplasia of stomach and duodenum without bleeding: Secondary | ICD-10-CM

## 2016-10-03 DIAGNOSIS — Z789 Other specified health status: Secondary | ICD-10-CM

## 2016-10-03 DIAGNOSIS — Z7901 Long term (current) use of anticoagulants: Secondary | ICD-10-CM

## 2016-10-03 DIAGNOSIS — D509 Iron deficiency anemia, unspecified: Secondary | ICD-10-CM | POA: Insufficient documentation

## 2016-10-03 HISTORY — DX: Iron deficiency anemia secondary to blood loss (chronic): D50.0

## 2016-10-03 LAB — CBC WITH DIFFERENTIAL/PLATELET
BASOS ABS: 0 10*3/uL (ref 0.0–0.1)
Basophils Relative: 1 %
EOS PCT: 13 %
Eosinophils Absolute: 0.9 10*3/uL — ABNORMAL HIGH (ref 0.0–0.7)
HEMATOCRIT: 37.7 % — AB (ref 39.0–52.0)
Hemoglobin: 11.4 g/dL — ABNORMAL LOW (ref 13.0–17.0)
LYMPHS ABS: 1.2 10*3/uL (ref 0.7–4.0)
LYMPHS PCT: 17 %
MCH: 26 pg (ref 26.0–34.0)
MCHC: 30.2 g/dL (ref 30.0–36.0)
MCV: 86.1 fL (ref 78.0–100.0)
Monocytes Absolute: 0.4 10*3/uL (ref 0.1–1.0)
Monocytes Relative: 6 %
NEUTROS ABS: 4.5 10*3/uL (ref 1.7–7.7)
NEUTROS PCT: 64 %
Platelets: 238 10*3/uL (ref 150–400)
RBC: 4.38 MIL/uL (ref 4.22–5.81)
RDW: 17.3 % — AB (ref 11.5–15.5)
WBC: 7 10*3/uL (ref 4.0–10.5)

## 2016-10-03 LAB — RETICULOCYTES
RBC.: 4.38 MIL/uL (ref 4.22–5.81)
RETIC CT PCT: 1.6 % (ref 0.4–3.1)
Retic Count, Absolute: 70.1 10*3/uL (ref 19.0–186.0)

## 2016-10-03 LAB — COMPREHENSIVE METABOLIC PANEL
ALBUMIN: 3.9 g/dL (ref 3.5–5.0)
ALT: 14 U/L — AB (ref 17–63)
AST: 20 U/L (ref 15–41)
Alkaline Phosphatase: 87 U/L (ref 38–126)
Anion gap: 8 (ref 5–15)
BILIRUBIN TOTAL: 0.3 mg/dL (ref 0.3–1.2)
BUN: 27 mg/dL — AB (ref 6–20)
CHLORIDE: 103 mmol/L (ref 101–111)
CO2: 26 mmol/L (ref 22–32)
CREATININE: 1.49 mg/dL — AB (ref 0.61–1.24)
Calcium: 9.1 mg/dL (ref 8.9–10.3)
GFR calc Af Amer: 47 mL/min — ABNORMAL LOW (ref >60)
GFR calc non Af Amer: 41 mL/min — ABNORMAL LOW (ref >60)
GLUCOSE: 165 mg/dL — AB (ref 65–99)
POTASSIUM: 4 mmol/L (ref 3.5–5.1)
Sodium: 137 mmol/L (ref 135–145)
Total Protein: 7.2 g/dL (ref 6.5–8.1)

## 2016-10-03 LAB — IRON AND TIBC
Iron: 22 ug/dL — ABNORMAL LOW (ref 45–182)
Saturation Ratios: 5 % — ABNORMAL LOW (ref 17.9–39.5)
TIBC: 428 ug/dL (ref 250–450)
UIBC: 406 ug/dL

## 2016-10-03 LAB — FERRITIN: FERRITIN: 26 ng/mL (ref 24–336)

## 2016-10-03 LAB — LACTATE DEHYDROGENASE: LDH: 146 U/L (ref 98–192)

## 2016-10-03 LAB — VITAMIN B12: VITAMIN B 12: 509 pg/mL (ref 180–914)

## 2016-10-03 LAB — FOLATE: FOLATE: 12.2 ng/mL (ref >5.9)

## 2016-10-03 NOTE — Progress Notes (Signed)
Douglas [name] presented for labwork. Labs per MD order drawn via Peripheral Line 23 gauge needle inserted in lt ac Good blood return present. Procedure without incident.  Needle removed intact. Patient tolerated procedure well.

## 2016-10-03 NOTE — Patient Instructions (Addendum)
[place] at [facility] Discharge Instructions  RECOMMENDATIONS MADE BY THE CONSULTANT AND ANY TEST RESULTS WILL BE SENT TO YOUR REFERRING PHYSICIAN.  You were seen today by Dr. Whitney Muse.  Injectafer to be given.  Labs today.   Return after the New Year with CBC diff/ferritin   Thank you for choosing [place] at [facility] to provide your oncology and hematology care.  To afford each patient quality time with our provider, please arrive at least 15 minutes before your scheduled appointment time.   Beginning January 23rd 2017 lab work for the [facility] will be done in the  Main lab at [facility] on 1st floor. If you have a lab appointment with the [place] please come in thru the  Main Entrance and check in at the main information desk  You need to re-schedule your appointment should you arrive 10 or more minutes late.  We strive to give you quality time with our providers, and arriving late affects you and other patients whose appointments are after yours.  Also, if you no show three or more times for appointments you may be dismissed from the clinic at the providers discretion.     Again, thank you for choosing [facility].  Our hope is that these requests will decrease the amount of time that you wait before being seen by our physicians.       _____________________________________________________________  Should you have questions after your visit to [facility], please contact our office at [phone] between the hours of 8:30 a.m. and 4:30 p.m.  Voicemails left after 4:30 p.m. will not be returned until the following business day.  For prescription refill requests, have your pharmacy contact our office.         Resources For Cancer Patients and their Caregivers ? American Cancer Society: Can assist with transportation, wigs, general needs, runs Look Good Feel Better.         6571018995 ? Cancer Care: Provides financial assistance, online support groups, medication/co-pay assistance.  1-800-813-HOPE 812-612-9273) ? [place] Assists [place] cancer patients and their families through emotional , educational and financial support.  339-765-7361 ? [place] DSS Where to apply for food stamps, Medicaid and utility assistance. 239-611-5281 ? RCATS: Transportation to medical appointments. 279-456-6474 ? Social Security Administration: May apply for disability if have a Stage IV cancer. 575-855-3408 [phone] ? [employer], Disability and Transit Services: Assists with nutrition, care and transit needs. [place] Support Programs: @10RELATIVEDAYS @ > Cancer Support Group  2nd Tuesday of the month 1pm-2pm, Journey Room  > Creative Journey  3rd Tuesday of the month 1130am-1pm, Journey Room  > Look Good Feel Better  1st Wednesday of the month 10am-12 noon, Journey Room (Call [place] to register [phone])

## 2016-10-03 NOTE — Progress Notes (Signed)
Douglas [name] NOTE  Patient Care Team: [name], MD as PCP - General (Internal Medicine) Satira Sark, MD (Cardiology) Danie Binder, MD as Consulting Physician (Gastroenterology)  CHIEF COMPLAINTS/PURPOSE OF CONSULTATION:  Iron deficiency anemia; history of gastric AVMs Chronic diastolic heart failure Chronic kidney disease stage III Atrial fibrillation, s/p pacemaker placement.  HISTORY OF PRESENTING ILLNESS:  Douglas [name] 82 y.o. male is here because of referral from Dr. Willey Blade for iron deficiency anemia and possible parenteral iron replacement. He has atrial fibrillation and a Psychologist, forensic. He is on chronic coumadin therapy.   Douglas [name] is a pleasant 82 y.o. who was being followed by his primary care physician Dr. Willey Blade for iron deficiency anemia. The patient was taking oral iron but discontinued this medication when it turned his stools black in color and gave him constipation. He has a history of gastric AVM. He was last seen by Dr. Willey Blade on 08/30/2016 and scheduled to return in 4 months. Outside lab results are listed below.   He was admitted to [facility] from 7/28 to 05/20/2016 with weakness and melena. He was anemic with a hemoglobin of 7.1. He was given 2 U PRBC, IV protonix. His coumadin was held. EGD showed a large gastric AVM. He is back on his coumadin.   Mr. Shows presents to the [place] unaccompanied today.   He reports oral iron gave him constipation.   For the last two weeks he has had an issue with loose stool. Previously he ran on the constipated side. At night around 2 am his stomach starts churning. He describes this stool as dark but doesn't believe it is blood.   He doesn't do a whole lot, just cutting some wood. He admits he gives out more than he used to but he isn't sure if he is tired as he isn't sure how an 82 year-old is supposed to feel.   He wakes up every two to three hours at night but is usually able to get back to  sleep.  He reports joint pain in his knees. This is chronic. It is only somewhat limiting.   His breathing is pretty good. He has an area in his throat which doesn't feel like a sore throat but causes him to have trouble swallowing pills and sometimes food. Dr. Laural Golden has stretched his throat before. He has had an EGD earlier this year.   States when the sun goes down he gets cold.   He denies any nausea, vomiting, or fevers. He doesn't crave ice.   He has received a flu shot this year and gets one every year.  He is scheduled to follow up with his cataract doctor on January 8th. He has not been scheduled for surgery yet.  The patient is here for further discussion and evaluation of iron deficiency anemia along with treatment options.   MEDICAL HISTORY:  Past Medical History:  Diagnosis Date  . Aneurysm of thoracic aorta (HCC)    Ascending. 5.7 x 5.5cm - Bent[place] procedure  . Aortic stenosis    Bioprosthetic AVR  . Arthritis   . Atrial fibrillation ([place])    Documented on pacer interrogation 6/13  . Complete heart block ([place])    Status post pacemaker placement  . Coronary atherosclerosis of native coronary artery    Multivessel - LIMA to LAD, SVG to OM, SVG to PDA  . DJD (degenerative joint disease)   . Esophageal dysmotility   . Essential hypertension,  benign   . Fibromyalgia   . Gout   . H/O hiatal hernia   . History of skin cancer   . Hx of adenomatous colonic polyps   . PMR (polymyalgia rheumatica) (HCC)     SURGICAL HISTORY: Past Surgical History:  Procedure Laterality Date  . AORTIC VALVE REPLACEMENT  01/24/12  . APPENDECTOMY    . [place] or 1966 lumbar back surgery  . BENTALL PROCEDURE  01/24/2012   Procedure: BENTALL PROCEDURE;  Surgeon: Ivin Poot, MD;  Location: [place];  Service: Open Heart Surgery;  Laterality: N/A;  . BILATERAL KNEE ARTHROSCOPY    . CORONARY ARTERY BYPASS GRAFT  01/24/2012   Procedure: CORONARY ARTERY BYPASS GRAFTING (CABG);   Surgeon: Ivin Poot, MD;  Location: [place];  Service: Open Heart Surgery;  Laterality: N/A;  . ESOPHAGOGASTRODUODENOSCOPY  06/17/2012   Procedure: ESOPHAGOGASTRODUODENOSCOPY (EGD);  Surgeon: Rogene Houston, MD;  Location[facility];  Service: Endoscopy;  Laterality: N/A;  ED  . ESOPHAGOGASTRODUODENOSCOPY N/A 05/19/2016   Procedure: ESOPHAGOGASTRODUODENOSCOPY (EGD);  Surgeon: Danie Binder, MD;  Location: [facility];  Service: Endoscopy;  Laterality: N/A;  . HEMORROIDECTOMY    . HERNIA REPAIR    . Left inguinal hernia repair    . PACEMAKER PLACEMENT     [employer] 4/13 - Dr. Rayann Heman  . PERMANENT PACEMAKER INSERTION N/A 02/01/2012   Procedure: PERMANENT PACEMAKER INSERTION;  Surgeon: Thompson Grayer, MD; [place] 1 (serial number [id number])      SOCIAL HISTORY: Social History   Social History  . Marital status: Married    Spouse name: N/A  . Number of children: 4  . Years of education: N/A   Occupational History  . Retired     Scientist, forensic   Social History Main Topics  . Smoking status: Never Smoker  . Smokeless tobacco: Never Used  . Alcohol use No  . Drug use: No  . Sexual activity: No   Other Topics Concern  . Not on file   Social History Narrative   Lives with wife, has been married since 16.   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 Non smoker He used to hunt with his father.   FAMILY HISTORY: Family History  Problem Relation Age of Onset  . Kidney disease Mother   . Heart disease Father   . Anesthesia problems Neg Hx   . Hypotension Neg Hx   . Malignant hyperthermia Neg Hx   . Pseudochol deficiency Neg Hx    Mother deceased at 73 years-old of kidney disease Father deceased at 6 years-old He has two sisters One sister receives IV iron in our clinic  ALLERGIES:  is allergic to imdur [isosorbide nitrate].  MEDICATIONS:   Current Outpatient Prescriptions  Medication Sig Dispense Refill  . allopurinol (ZYLOPRIM) 300 MG tablet Take 450 mg by mouth daily. Take 1 1/2 daily    . ferrous sulfate 325 (65 FE) MG tablet Take 1 tablet (325 mg total) by mouth 2 (two) times daily with a meal. 60 tablet 3  . furosemide (LASIX) 40 MG tablet TAKE 1 AND 1/2 TABLETS DAILY. 45 tablet 6  . metoprolol succinate (TOPROL-XL) 25 MG 24 hr tablet TAKE (2) TABLETS BY MOUTH DAILY. 60 tablet 6  . pantoprazole (PROTONIX) 40 MG tablet TAKE ONE TABLET BY MOUTH ONCE DAILY IN THE  MORNING. 30 tablet 6  . pravastatin (PRAVACHOL) 20 MG tablet Take 20 mg by mouth daily.   11  . warfarin (COUMADIN) 5 MG tablet Take 1 tablet daily except 1/2 tablet on Wednesdays and Saturdays 90 tablet 4   Current Facility-Administered Medications  Medication Dose Route Frequency Provider Last Rate Last Dose  . ferrous sulfate tablet 325 mg  325 mg Oral BID WC [name], MD        Review of Systems  Constitutional: Negative.  Negative for fever.  HENT: Negative.        Intermittent trouble swallowing  Eyes: Negative.   Respiratory: Negative.   Cardiovascular: Negative.   Gastrointestinal: Negative.  Negative for nausea and vomiting.       Loose dark stool  Genitourinary: Negative.   Musculoskeletal: Positive for joint pain.       Knee pain  Skin: Negative.   Neurological: Negative.   Endo/Heme/Allergies: Negative.   Psychiatric/Behavioral: Negative.   All other systems reviewed and are negative. 14 point ROS was done and is otherwise as detailed above or in HPI   PHYSICAL EXAMINATION: ECOG PERFORMANCE STATUS: 1 - Symptomatic but completely ambulatory  Vitals:   10/03/16 1521  BP: 130/60  Pulse: 80  Resp: 18  Temp: 98.7 F (37.1 C)   Filed Weights   10/03/16 1521  Weight: 205 lb 4.8 oz (93.1 kg)    Physical Exam  Constitutional: He is oriented to person, place, and time and well-developed, well-nourished, and in no distress.  HENT:   Head: Normocephalic and atraumatic.  Nose: Nose normal.  Mouth/Throat: Oropharynx is clear and moist. No oropharyngeal exudate.  Eyes: Conjunctivae and EOM are normal. Pupils are equal, round, and reactive to light. Right eye exhibits no discharge. Left eye exhibits no discharge. No scleral icterus.  Neck: Normal range of motion. Neck supple. No tracheal deviation present. No thyromegaly present.  Cardiovascular: Normal rate, regular rhythm and normal heart sounds.  Exam reveals no gallop and no friction rub.   No murmur heard. Pulmonary/Chest: Effort normal and breath sounds normal. He has no wheezes. He has no rales.  Abdominal: Soft. Bowel sounds are normal. He exhibits no distension and no mass. There is no tenderness. There is no rebound and no guarding.  Musculoskeletal: Normal range of motion. He exhibits no edema.  Lymphadenopathy:    He has no cervical adenopathy.  Neurological: He is alert and oriented to person, place, and time. He has normal reflexes. No cranial nerve deficit. Gait normal. Coordination normal.  Skin: Skin is warm and dry. No rash noted.  Psychiatric: Mood, memory, affect and judgment normal.  Nursing note and vitals reviewed.   LABORATORY DATA:  I have reviewed the data as listed Lab Results  Component Value Date   WBC 9.3 05/20/2016   HGB 7.9 (L) 05/20/2016   HCT 26.0 (L) 05/20/2016   MCV 88.7 05/20/2016   PLT 166 05/20/2016   CMP     Component Value Date/Time   NA 142 05/20/2016 0448   K 3.9 05/20/2016 0448   CL 112 (H) 05/20/2016 0448   CO2 26 05/20/2016 0448   GLUCOSE 112 (H) 05/20/2016 0448   BUN 38 (H) 05/20/2016 0448   CREATININE 1.22 05/20/2016 0448   CREATININE 1.60 (H) 02/10/2013 1009   CALCIUM 8.3 (L) 05/20/2016 0448   PROT 6.0 01/18/2015 0501   ALBUMIN 2.6 (L) 01/18/2015 0501   AST 37 01/18/2015 0501   ALT 33 01/18/2015 0501   ALKPHOS 102  01/18/2015 0501   BILITOT 0.5 01/18/2015 0501   GFRNONAA 52 (L) 05/20/2016 0448   GFRAA  >60 05/20/2016 0448   Outside labs:           RADIOGRAPHIC STUDIES: I have personally reviewed the radiological images as listed and agreed with the findings in the report. No results found.  ASSESSMENT & PLAN:  Iron deficiency anemia CKD stage III Oral iron intolerance EGD 05/19/2016 with large gastric AVM Chronic Anticoagulation  He has had improvement in his H/H with oral iron however he is unable to continue taking it secondary to GI related side effects. He has gastric AVM's. The most likely cause of his anemia is due to chronic blood loss.  We discussed some of the risks, benefits, and alternatives of intravenous iron infusions. The patient is symptomatic from anemia and the iron level is critically low. He tolerated oral iron supplement poorly and desires to achieved higher levels of iron faster for adequate hematopoesis. Some of the side-effects to be expected including risks of infusion reactions, phlebitis, headaches, nausea and fatigue.  The patient is willing to proceed. Patient education material was dispensed.   Goal is to keep ferritin level greater than or equal to 100 ng/ml  I will see him back 2 weeks after his iron infusion. We will discuss ongoing follow-up of his CBC and ferritin. Given his recent GI bleed and ongoing coumadin use, we may need to monitor labs closely moving forward.  Additional anemia evaluation will also be performed, to this patient is agreeable.   Orders Placed This Encounter  Procedures  . CBC with Differential  . Comprehensive metabolic panel  . IgG, IgA, IgM  . Immunofixation electrophoresis  . Protein electrophoresis, serum  . Iron and TIBC  . Ferritin  . Vitamin B12  . Folate  . Lactate dehydrogenase  . Reticulocytes  . CBC with Differential    Standing Status:   Future    Standing Expiration Date:   10/03/2017  . Ferritin    Standing Status:   Future    Standing Expiration Date:   10/03/2017    He will return for  follow up in 2 weeks.   All questions were answered. The patient knows to call the clinic with any problems, questions or concerns.  This document serves as a record of services personally performed by Ancil Linsey, MD. It was created on her behalf by Arlyce Harman, a trained medical scribe. The creation of this record is based on the scribe's personal observations and the provider's statements to them. This document has been checked and approved by the attending provider.  I have reviewed the above documentation for accuracy and completeness and I agree with the above.  This note was electronically signed.    Molli Hazard, MD  10/03/2016 3:43 PM

## 2016-10-04 LAB — IGG, IGA, IGM
IGA: 173 mg/dL (ref 61–437)
IGG (IMMUNOGLOBIN G), SERUM: 918 mg/dL (ref 700–1600)
IgM, Serum: 31 mg/dL (ref 15–143)

## 2016-10-05 ENCOUNTER — Encounter: Payer: Self-pay | Admitting: Cardiology

## 2016-10-05 LAB — PROTEIN ELECTROPHORESIS, SERUM
A/G Ratio: 1.1 (ref 0.7–1.7)
ALBUMIN ELP: 3.5 g/dL (ref 2.9–4.4)
Alpha-1-Globulin: 0.3 g/dL (ref 0.0–0.4)
Alpha-2-Globulin: 0.8 g/dL (ref 0.4–1.0)
Beta Globulin: 1.1 g/dL (ref 0.7–1.3)
GAMMA GLOBULIN: 0.9 g/dL (ref 0.4–1.8)
Globulin, Total: 3.1 g/dL (ref 2.2–3.9)
M-SPIKE, %: 0.3 g/dL — AB
TOTAL PROTEIN ELP: 6.6 g/dL (ref 6.0–8.5)

## 2016-10-05 LAB — IMMUNOFIXATION ELECTROPHORESIS
IGG (IMMUNOGLOBIN G), SERUM: 947 mg/dL (ref 700–1600)
IgA: 177 mg/dL (ref 61–437)
IgM, Serum: 34 mg/dL (ref 15–143)
Total Protein ELP: 6.7 g/dL (ref 6.0–8.5)

## 2016-10-09 ENCOUNTER — Encounter (HOSPITAL_BASED_OUTPATIENT_CLINIC_OR_DEPARTMENT_OTHER): Payer: Medicare Other

## 2016-10-09 VITALS — BP 126/57 | HR 70 | Temp 97.9°F | Resp 18

## 2016-10-09 DIAGNOSIS — D500 Iron deficiency anemia secondary to blood loss (chronic): Secondary | ICD-10-CM

## 2016-10-09 MED ORDER — SODIUM CHLORIDE 0.9 % IV SOLN
INTRAVENOUS | Status: DC
  Administered 2016-10-09: 14:00:00 via INTRAVENOUS

## 2016-10-09 MED ORDER — SODIUM CHLORIDE 0.9 % IV SOLN
750.0000 mg | Freq: Once | INTRAVENOUS | Status: AC
  Administered 2016-10-09: 750 mg via INTRAVENOUS
  Filled 2016-10-09: qty 15

## 2016-10-09 NOTE — Patient Instructions (Signed)
[place] at [facility] Discharge Instructions  RECOMMENDATIONS MADE BY THE CONSULTANT AND ANY TEST RESULTS WILL BE SENT TO YOUR REFERRING PHYSICIAN.  Received Injectafer infusion today. Follow-up as scheduled. Call clinic for any questions or concerns  Thank you for choosing [place] at [facility] to provide your oncology and hematology care.  To afford each patient quality time with our provider, please arrive at least 15 minutes before your scheduled appointment time.   Beginning January 23rd 2017 lab work for the [facility] will be done in the  Main lab at [facility] on 1st floor. If you have a lab appointment with the [place] please come in thru the  Main Entrance and check in at the main information desk  You need to re-schedule your appointment should you arrive 10 or more minutes late.  We strive to give you quality time with our providers, and arriving late affects you and other patients whose appointments are after yours.  Also, if you no show three or more times for appointments you may be dismissed from the clinic at the providers discretion.     Again, thank you for choosing [facility].  Our hope is that these requests will decrease the amount of time that you wait before being seen by our physicians.       _____________________________________________________________  Should you have questions after your visit to [facility], please contact our office at [phone] between the hours of 8:30 a.m. and 4:30 p.m.  Voicemails left after 4:30 p.m. will not be returned until the following business day.  For prescription refill requests, have your pharmacy contact our office.         Resources For Cancer Patients and their Caregivers ? American Cancer Society: Can assist with transportation, wigs, general needs, runs Look Good Feel Better.        6817723680 ? Cancer Care: Provides  financial assistance, online support groups, medication/co-pay assistance.  1-800-813-HOPE 954-047-8808) ? [place] Assists [place] cancer patients and their families through emotional , educational and financial support.  864-853-6364 ? [place] DSS Where to apply for food stamps, Medicaid and utility assistance. [phone] 541-3752 ? RCATS: Transportation to medical appointments. [phone] ? Social Security Administration: May apply for disability if have a Stage IV cancer. [phone] 908-099-5476 ? [employer], Disability and Transit Services: Assists with nutrition, care and transit needs. [place] Support Programs: @10RELATIVEDAYS @ > Cancer Support Group  2nd Tuesday of the month 1pm-2pm, Journey Room  > Creative Journey  3rd Tuesday of the month 1130am-1pm, Journey Room  > Look Good Feel Better  1st Wednesday of the month 10am-12 noon, Journey Room (Call [place] to register [phone])

## 2016-10-09 NOTE — Progress Notes (Signed)
Douglas [name] tolerated Injectafer infusion well without complaints or incident. Pt instructed in purpose and side effects of Injectafer prior to administration VSS upon discharge. Pt instructed to call for any problems and verbalized understanding. Pt discharged self ambulatory in satisfactory condition

## 2016-10-10 ENCOUNTER — Ambulatory Visit (HOSPITAL_COMMUNITY): Payer: Medicare Other | Admitting: Hematology & Oncology

## 2016-10-11 LAB — CUP PACEART REMOTE DEVICE CHECK
Battery Remaining Longevity: 94 mo
Battery Voltage: 2.77 V
Brady Statistic AP VP Percent: 65 %
Brady Statistic AS VS Percent: 0 %
Date Time Interrogation Session: 20171211143410
Implantable Lead Implant Date: 20130412
Implantable Lead Implant Date: 20130412
Implantable Lead Location: 753859
Implantable Pulse Generator Implant Date: 20130412
Lead Channel Impedance Value: 452 Ohm
Lead Channel Pacing Threshold Amplitude: 0.5 V
Lead Channel Pacing Threshold Pulse Width: 0.4 ms
Lead Channel Setting Pacing Amplitude: 2 V
Lead Channel Setting Pacing Amplitude: 2 V
Lead Channel Setting Sensing Sensitivity: 4 mV
MDC IDC LEAD LOCATION: 753860
MDC IDC MSMT BATTERY IMPEDANCE: 377 Ohm
MDC IDC MSMT LEADCHNL RA SENSING INTR AMPL: 0.7 mV
MDC IDC MSMT LEADCHNL RV IMPEDANCE VALUE: 552 Ohm
MDC IDC SET LEADCHNL RV PACING PULSEWIDTH: 0.4 ms
MDC IDC STAT BRADY AP VS PERCENT: 0 %
MDC IDC STAT BRADY AS VP PERCENT: 35 %

## 2016-10-16 ENCOUNTER — Encounter (HOSPITAL_COMMUNITY): Payer: Self-pay | Admitting: Hematology & Oncology

## 2016-10-19 ENCOUNTER — Encounter: Payer: Self-pay | Admitting: Cardiology

## 2016-10-25 ENCOUNTER — Encounter (HOSPITAL_COMMUNITY): Payer: Medicare Other

## 2016-10-25 ENCOUNTER — Encounter (HOSPITAL_COMMUNITY): Payer: Medicare Other | Attending: Hematology & Oncology | Admitting: Oncology

## 2016-10-25 ENCOUNTER — Encounter (HOSPITAL_COMMUNITY): Payer: Self-pay | Admitting: Oncology

## 2016-10-25 VITALS — BP 124/63 | HR 73 | Temp 98.2°F | Resp 18 | Ht 68.0 in | Wt 207.0 lb

## 2016-10-25 DIAGNOSIS — D649 Anemia, unspecified: Secondary | ICD-10-CM | POA: Insufficient documentation

## 2016-10-25 DIAGNOSIS — D500 Iron deficiency anemia secondary to blood loss (chronic): Secondary | ICD-10-CM

## 2016-10-25 LAB — CBC WITH DIFFERENTIAL/PLATELET
BASOS PCT: 1 %
Basophils Absolute: 0.1 10*3/uL (ref 0.0–0.1)
EOS ABS: 0.6 10*3/uL (ref 0.0–0.7)
Eosinophils Relative: 8 %
HEMATOCRIT: 41.2 % (ref 39.0–52.0)
Hemoglobin: 12.6 g/dL — ABNORMAL LOW (ref 13.0–17.0)
Lymphocytes Relative: 18 %
Lymphs Abs: 1.3 10*3/uL (ref 0.7–4.0)
MCH: 27.9 pg (ref 26.0–34.0)
MCHC: 30.6 g/dL (ref 30.0–36.0)
MCV: 91.4 fL (ref 78.0–100.0)
MONO ABS: 0.6 10*3/uL (ref 0.1–1.0)
MONOS PCT: 9 %
NEUTROS ABS: 4.6 10*3/uL (ref 1.7–7.7)
NEUTROS PCT: 64 %
Platelets: 178 10*3/uL (ref 150–400)
RBC: 4.51 MIL/uL (ref 4.22–5.81)
RDW: 20.7 % — AB (ref 11.5–15.5)
WBC: 7.1 10*3/uL (ref 4.0–10.5)

## 2016-10-25 LAB — FERRITIN: FERRITIN: 146 ng/mL (ref 24–336)

## 2016-10-25 NOTE — Progress Notes (Signed)
[name], MD [place] [address]  Iron deficiency anemia due to chronic blood loss - Plan: CBC with Differential, Iron and TIBC, Ferritin, Basic metabolic panel  CURRENT THERAPY: IV iron when indicated.  INTERVAL HISTORY: Douglas [name] 83 y.o. male returns for followup of iron deficiency anemia, secondary to chronic GI blood loss with AVMs; further complicated by vitamin K antagonist anticoagulation and intolerance to PO iron therapy (ferrous sulfate) with constipation.  He tolerated his iron infusion well.  He denies noticing any significant difference since his iron infusion symptomatically.  He confirms that he is on Coumadin and he is well educated with regards to his cause of iron deficiency anemia.  He denies any complaints today.  Review of Systems  Constitutional: Negative.  Negative for chills, fever and weight loss.  HENT: Negative.   Eyes: Negative.   Respiratory: Negative.  Negative for cough.   Cardiovascular: Negative.  Negative for chest pain.  Gastrointestinal: Negative.  Negative for blood in stool and melena.  Genitourinary: Negative.  Negative for hematuria.  Musculoskeletal: Negative.   Skin: Negative.   Neurological: Negative.  Negative for weakness.  Endo/Heme/Allergies: Negative.   Psychiatric/Behavioral: Negative.     Past Medical History:  Diagnosis Date  . Aneurysm of thoracic aorta (HCC)    Ascending. 5.7 x 5.5cm - Bentall procedure  . Aortic stenosis    Bioprosthetic AVR  . Arthritis   . Atrial fibrillation ([place])    Documented on pacer interrogation 6/13  . Complete heart block ([place])    Status post pacemaker placement  . Coronary atherosclerosis of native coronary artery    Multivessel - LIMA to LAD, SVG to OM, SVG to PDA  . DJD (degenerative joint disease)   . Esophageal dysmotility   . Essential hypertension, benign   . Fibromyalgia   . Gout   . H/O hiatal hernia   . History of skin cancer   . Hx  of adenomatous colonic polyps   . Iron deficiency anemia due to chronic blood loss 10/03/2016  . PMR (polymyalgia rheumatica) (HCC)     Past Surgical History:  Procedure Laterality Date  . AORTIC VALVE REPLACEMENT  01/24/12  . APPENDECTOMY    . [place] or 1966 lumbar back surgery  . BENTALL PROCEDURE  01/24/2012   Procedure: BENTALL PROCEDURE;  Surgeon: Ivin Poot, MD;  Location: [facility];  Service: Open Heart Surgery;  Laterality: N/A;  . BILATERAL KNEE ARTHROSCOPY    . CORONARY ARTERY BYPASS GRAFT  01/24/2012   Procedure: CORONARY ARTERY BYPASS GRAFTING (CABG);  Surgeon: Ivin Poot, MD;  Location: [facility];  Service: Open Heart Surgery;  Laterality: N/A;  . ESOPHAGOGASTRODUODENOSCOPY  06/17/2012   Procedure: ESOPHAGOGASTRODUODENOSCOPY (EGD);  Surgeon: Rogene Houston, MD;  [facility];  Service: Endoscopy;  Laterality: N/A;  ED  . ESOPHAGOGASTRODUODENOSCOPY N/A 05/19/2016   Procedure: ESOPHAGOGASTRODUODENOSCOPY (EGD);  Surgeon: Danie Binder, MD;  [facility];  Service: Endoscopy;  Laterality: N/A;  . HEMORROIDECTOMY    . HERNIA REPAIR    . Left inguinal hernia repair    . PACEMAKER PLACEMENT     [employer] 4/13 - Dr. Rayann Heman  . PERMANENT PACEMAKER INSERTION N/A 02/01/2012   Procedure: PERMANENT PACEMAKER INSERTION;  Surgeon: Thompson Grayer, MD; [place] 1 (serial number [id number])      Family History  Problem Relation Age of Onset  .  Kidney disease Mother   . Heart disease Father   . Anesthesia problems Neg Hx   . Hypotension Neg Hx   . Malignant hyperthermia Neg Hx   . Pseudochol deficiency Neg Hx     Social History   Social History  . Marital status: Married    Spouse name: N/A  . Number of children: 4  . Years of education: N/A   Occupational History  . Retired     Scientist, forensic   Social History Main Topics  . Smoking status: Never Smoker  . Smokeless tobacco: Never Used  . Alcohol use No  . Drug use: No   . Sexual activity: No   Other Topics Concern  . None   Social History Narrative   Lives with wife, has been married since 1954.     PHYSICAL EXAMINATION  ECOG PERFORMANCE STATUS: 1 - Symptomatic but completely ambulatory  Vitals:   10/25/16 1344  BP: 124/63  Pulse: 73  Resp: 18  Temp: 98.2 F (36.8 C)    GENERAL:alert, no distress, well nourished, well developed, comfortable, cooperative, smiling and unaccompanied SKIN: skin color, texture, turgor are normal, no rashes or significant lesions HEAD: Normocephalic, No masses, lesions, tenderness or abnormalities EYES: normal, EOMI, Conjunctiva are pink and non-injected EARS: External ears normal OROPHARYNX:lips, buccal mucosa, and tongue normal and mucous membranes are moist  NECK: supple, trachea midline LYMPH:  no palpable lymphadenopathy BREAST:not examined LUNGS: clear to auscultation  HEART: irregularly irregular and systolic murmur 1/6 ABDOMEN:abdomen soft and normal bowel sounds BACK: Back symmetric, no curvature. EXTREMITIES:less then 2 second capillary refill, no joint deformities, effusion, or inflammation, no skin discoloration, no cyanosis  NEURO: alert & oriented x 3 with fluent speech, no focal motor/sensory deficits, gait normal   LABORATORY DATA: CBC    Component Value Date/Time   WBC 7.1 10/25/2016 1220   RBC 4.51 10/25/2016 1220   HGB 12.6 (L) 10/25/2016 1220   HCT 41.2 10/25/2016 1220   PLT 178 10/25/2016 1220   MCV 91.4 10/25/2016 1220   MCH 27.9 10/25/2016 1220   MCHC 30.6 10/25/2016 1220   RDW 20.7 (H) 10/25/2016 1220   LYMPHSABS 1.3 10/25/2016 1220   MONOABS 0.6 10/25/2016 1220   EOSABS 0.6 10/25/2016 1220   BASOSABS 0.1 10/25/2016 1220      Chemistry      Component Value Date/Time   NA 137 10/03/2016 1557   K 4.0 10/03/2016 1557   CL 103 10/03/2016 1557   CO2 26 10/03/2016 1557   BUN 27 (H) 10/03/2016 1557   CREATININE 1.49 (H) 10/03/2016 1557   CREATININE 1.60 (H) 02/10/2013  1009      Component Value Date/Time   CALCIUM 9.1 10/03/2016 1557   ALKPHOS 87 10/03/2016 1557   AST 20 10/03/2016 1557   ALT 14 (L) 10/03/2016 1557   BILITOT 0.3 10/03/2016 1557     Lab Results  Component Value Date   IRON 22 (L) 10/03/2016   TIBC 428 10/03/2016   FERRITIN 26 10/03/2016     PENDING LABS:   RADIOGRAPHIC STUDIES:  No results found.   PATHOLOGY:    ASSESSMENT AND PLAN:  Iron deficiency anemia due to chronic blood loss Iron deficiency anemia, secondary to chronic GI blood loss with AVMs; further complicated by vitamin K antagonist anticoagulation and intolerance to PO iron therapy (ferrous sulfate).  Labs today: CBC diff, ferritin.  I personally reviewed and went over laboratory results with the patient.  The results are noted within  this dictation.  S/P Injectafer 750 mg IV on 10/09/2016.  Labs every 3 weeks: CBC diff, ferritin.    Labs in 12 weeks: BMET.  Return in 12 weeks for follow-up.    ORDERS PLACED FOR THIS ENCOUNTER: Orders Placed This Encounter  Procedures  . CBC with Differential  . Iron and TIBC  . Ferritin  . Basic metabolic panel    MEDICATIONS PRESCRIBED THIS ENCOUNTER: No orders of the defined types were placed in this encounter.   THERAPY PLAN:  Will monitor iron studies closely over the next few months and replace iron when indicated.  All questions were answered. The patient knows to call the clinic with any problems, questions or concerns. We can certainly see the patient much sooner if necessary.  Patient and plan discussed with Dr. Ancil Linsey and she is in agreement with the aforementioned.   This note is electronically signed by: Doy Mince 10/25/2016 1:48 PM

## 2016-10-25 NOTE — Patient Instructions (Signed)
[place] at [facility] Discharge Instructions  RECOMMENDATIONS MADE BY THE CONSULTANT AND ANY TEST RESULTS WILL BE SENT TO YOUR REFERRING PHYSICIAN.  You were seen today by Kirby Crigler PA-C. Return every 3 weeks for labs. Return in 12 weeks for follow up.   Thank you for choosing [place] at [facility] to provide your oncology and hematology care.  To afford each patient quality time with our provider, please arrive at least 15 minutes before your scheduled appointment time.    If you have a lab appointment with the [facility] please come in thru the  Main Entrance and check in at the main information desk  You need to re-schedule your appointment should you arrive 10 or more minutes late.  We strive to give you quality time with our providers, and arriving late affects you and other patients whose appointments are after yours.  Also, if you no show three or more times for appointments you may be dismissed from the clinic at the providers discretion.     Again, thank you for choosing [facility].  Our hope is that these requests will decrease the amount of time that you wait before being seen by our physicians.       _____________________________________________________________  Should you have questions after your visit to [facility], please contact our office at [phone] between the hours of 8:30 a.m. and 4:30 p.m.  Voicemails left after 4:30 p.m. will not be returned until the following business day.  For prescription refill requests, have your pharmacy contact our office.       Resources For Cancer Patients and their Caregivers ? American Cancer Society: Can assist with transportation, wigs, general needs, runs Look Good Feel Better.        907 189 3925 ? Cancer Care: Provides financial assistance, online support groups, medication/co-pay assistance.  1-800-813-HOPE (941) 187-5780) ? [place] Assists [place] cancer patients and their families through emotional , educational and financial support.  442-549-3707 ? [place] DSS Where to apply for food stamps, Medicaid and utility assistance. (825) 634-1677 ? [facility]: Transportation to medical appointments. (531) 641-4311 ? Social Security Administration: May apply for disability if have a Stage IV cancer. 807-463-8255 4780347965 ? [employer], Disability and Transit Services: Assists with nutrition, care and transit needs. [place] Support Programs: @10RELATIVEDAYS @ > Cancer Support Group  2nd Tuesday of the month 1pm-2pm, Journey Room  > Creative Journey  3rd Tuesday of the month 1130am-1pm, Journey Room  > Look Good Feel Better  1st Wednesday of the month 10am-12 noon, Journey Room (Call [place] to register [phone])

## 2016-10-25 NOTE — Assessment & Plan Note (Signed)
Iron deficiency anemia, secondary to chronic GI blood loss with AVMs; further complicated by vitamin K antagonist anticoagulation and intolerance to PO iron therapy (ferrous sulfate).  Labs today: CBC diff, ferritin.  I personally reviewed and went over laboratory results with the patient.  The results are noted within this dictation.  S/P Injectafer 750 mg IV on 10/09/2016.  Labs every 3 weeks: CBC diff, ferritin.    Labs in 12 weeks: BMET.  Return in 12 weeks for follow-up.

## 2016-10-29 DIAGNOSIS — H2513 Age-related nuclear cataract, bilateral: Secondary | ICD-10-CM | POA: Diagnosis not present

## 2016-10-29 DIAGNOSIS — H179 Unspecified corneal scar and opacity: Secondary | ICD-10-CM | POA: Diagnosis not present

## 2016-10-31 ENCOUNTER — Ambulatory Visit (INDEPENDENT_AMBULATORY_CARE_PROVIDER_SITE_OTHER): Payer: Medicare Other | Admitting: *Deleted

## 2016-10-31 DIAGNOSIS — Z952 Presence of prosthetic heart valve: Secondary | ICD-10-CM

## 2016-10-31 DIAGNOSIS — I4891 Unspecified atrial fibrillation: Secondary | ICD-10-CM | POA: Diagnosis not present

## 2016-10-31 LAB — POCT INR: INR: 2.4

## 2016-10-31 MED ORDER — FUROSEMIDE 40 MG PO TABS: 40.0000 mg | ORAL_TABLET | Freq: Every day | ORAL | 6 refills | Status: DC

## 2016-11-02 DIAGNOSIS — H2512 Age-related nuclear cataract, left eye: Secondary | ICD-10-CM | POA: Diagnosis not present

## 2016-11-08 ENCOUNTER — Other Ambulatory Visit (HOSPITAL_COMMUNITY): Payer: Medicare Other

## 2016-11-08 ENCOUNTER — Ambulatory Visit (HOSPITAL_COMMUNITY): Payer: Medicare Other | Admitting: Hematology & Oncology

## 2016-11-15 ENCOUNTER — Encounter (HOSPITAL_COMMUNITY): Payer: Medicare Other

## 2016-11-15 DIAGNOSIS — D500 Iron deficiency anemia secondary to blood loss (chronic): Secondary | ICD-10-CM

## 2016-11-15 DIAGNOSIS — D649 Anemia, unspecified: Secondary | ICD-10-CM | POA: Diagnosis not present

## 2016-11-15 LAB — CBC WITH DIFFERENTIAL/PLATELET
Basophils Absolute: 0.1 10*3/uL (ref 0.0–0.1)
Basophils Relative: 1 %
Eosinophils Absolute: 0.4 10*3/uL (ref 0.0–0.7)
Eosinophils Relative: 5 %
HCT: 42.1 % (ref 39.0–52.0)
HEMOGLOBIN: 13.3 g/dL (ref 13.0–17.0)
LYMPHS PCT: 15 %
Lymphs Abs: 1.2 10*3/uL (ref 0.7–4.0)
MCH: 29.1 pg (ref 26.0–34.0)
MCHC: 31.6 g/dL (ref 30.0–36.0)
MCV: 92.1 fL (ref 78.0–100.0)
MONO ABS: 0.7 10*3/uL (ref 0.1–1.0)
MONOS PCT: 9 %
NEUTROS ABS: 5.7 10*3/uL (ref 1.7–7.7)
NEUTROS PCT: 70 %
Platelets: 183 10*3/uL (ref 150–400)
RBC: 4.57 MIL/uL (ref 4.22–5.81)
RDW: 19.1 % — ABNORMAL HIGH (ref 11.5–15.5)
WBC: 8 10*3/uL (ref 4.0–10.5)

## 2016-11-15 LAB — IRON AND TIBC
Iron: 61 ug/dL (ref 45–182)
Saturation Ratios: 18 % (ref 17.9–39.5)
TIBC: 333 ug/dL (ref 250–450)
UIBC: 272 ug/dL

## 2016-11-15 LAB — FERRITIN: FERRITIN: 48 ng/mL (ref 24–336)

## 2016-11-26 NOTE — Patient Instructions (Signed)
Douglas [name]  11/26/2016     @PREFPERIOPPHARMACY @   Your procedure is scheduled on 12/03/2016.  Report to [name] at 6:15 A.M.  Call this number if you have problems the morning of surgery:  [phone]   Remember:  Do not eat food or drink liquids after midnight.  Take these medicines the morning of surgery with A SIP OF WATER Allopurinol, Toprol, Protonix   Do not wear jewelry, make-up or nail polish.  Do not wear lotions, powders, or perfumes, or deoderant.  Do not shave 48 hours prior to surgery.  Men may shave face and neck.  Do not bring valuables to the hospital.  [facility] is not responsible for any belongings or valuables.  Contacts, dentures or bridgework may not be worn into surgery.  Leave your suitcase in the car.  After surgery it may be brought to your room.  For patients admitted to the hospital, discharge time will be determined by your treatment team.  Patients discharged the day of surgery will not be allowed to drive home.    Please read over the following fact sheets that you were given. Anesthesia Post-op Instructions     PATIENT INSTRUCTIONS POST-ANESTHESIA  IMMEDIATELY FOLLOWING SURGERY:  Do not drive or operate machinery for the first twenty four hours after surgery.  Do not make any important decisions for twenty four hours after surgery or while taking narcotic pain medications or sedatives.  If you develop intractable nausea and vomiting or a severe headache please notify your doctor immediately.  FOLLOW-UP:  Please make an appointment with your surgeon as instructed. You do not need to follow up with anesthesia unless specifically instructed to do so.  WOUND CARE INSTRUCTIONS (if applicable):  Keep a dry clean dressing on the anesthesia/puncture wound site if there is drainage.  Once the wound has quit draining you may leave it open to air.  Generally you should leave the bandage intact for twenty four hours unless there is drainage.  If  the epidural site drains for more than 36-48 hours please call the anesthesia department.  QUESTIONS?:  Please feel free to call your physician or the hospital operator if you have any questions, and they will be happy to assist you.      Cataract Surgery Cataract surgery is a procedure to remove a cataract from your eye. A cataract is cloudiness on the lens of your eye. The lens focuses light inside the eye. When a lens becomes cloudy, your vision is affected. Cataract surgery is a procedure to remove the cloudy lens. A substitute lens (intraocular lens or IOL) is usually inserted as a replacement for the cloudy lens. Tell a health care provider about:  Any allergies you have.  All medicines you are taking, including vitamins, herbs, eye drops, creams, and over-the-counter medicines.  Any problems you or family members have had with anesthetic medicines.  Any blood disorders you have.  Any surgeries you have had, especially eye surgeries that include refractive surgery, such as PRK and LASIK.  Any medical conditions you have.  Whether you are pregnant or may be pregnant. What are the risks? Generally, this is a safe procedure. However, problems may occur, including:  Infection.  Bleeding.  Glaucoma.  Retinal detachment.  Allergic reactions to medicines.  Damage to other structures or organs.  Inflammation of the eye.  Clouding of the part of your eye that holds an IOL in place (after-cataract), if an IOL was inserted. This is fairly  common.  An IOL moving out of position, if an IOL was inserted. This is very rare.  Loss of vision. This is rare. What happens before the procedure?  Follow instructions from your health care provider about eating or drinking restrictions.  Ask your health care provider about:  Changing or stopping your regular medicines, including any eye drops you have been prescribed. This is especially important if you are taking diabetes medicines  or blood thinners.  Taking medicines such as aspirin and ibuprofen. These medicines can thin your blood. Do not take these medicines before your procedure if your health care provider instructs you not to.  Do not put contact lenses in either eye on the day of your surgery.  Plan for someone to drive you to and from the procedure.  If you will be going home right after the procedure, plan to have someone with you for 24 hours. What happens during the procedure?  An IV tube may be inserted into one of your veins.  You will be given one or more of the following:  A medicine to help you relax (sedative).  A medicine to numb the area (local anesthetic). This may be numbing eye drops or an injection that is given behind the eye.  A small cut (incision) will be made to the edge of the clear, dome-shaped surface that covers the front of the eye (cornea).  A small probe will be inserted into the eye. This device gives off ultrasound waves that soften and break up the cloudy center of the lens. This makes it easier for the cloudy lens to be removed by suction.  An IOL may be implanted.  Part of the capsule that surrounds the lens will be left in the eye to support the IOL.  Your surgeon may use stitches (sutures) to close the incision. The procedure may vary among health care providers and hospitals. What happens after the procedure?  Your blood pressure, heart rate, breathing rate, and blood oxygen level will be monitored often until the medicines you were given have worn off.  You may be given a protective shield to wear over your eyes.  Do not drive for 24 hours if you received a sedative. This information is not intended to replace advice given to you by your health care provider. Make sure you discuss any questions you have with your health care provider. Document Released: 09/27/2011 Document Revised: 03/15/2016 Document Reviewed: 08/18/2015 Elsevier Interactive Patient Education   2017 [employer].

## 2016-11-28 ENCOUNTER — Other Ambulatory Visit: Payer: Self-pay

## 2016-11-28 ENCOUNTER — Encounter (HOSPITAL_COMMUNITY): Payer: Self-pay

## 2016-11-28 ENCOUNTER — Encounter (HOSPITAL_COMMUNITY)
Admission: RE | Admit: 2016-11-28 | Discharge: 2016-11-28 | Disposition: A | Discharge status: Home / Self Care | Payer: Medicare Other | Source: Ambulatory Visit | Attending: Ophthalmology | Admitting: Ophthalmology

## 2016-11-28 DIAGNOSIS — Z01810 Encounter for preprocedural cardiovascular examination: Secondary | ICD-10-CM | POA: Diagnosis not present

## 2016-11-28 HISTORY — DX: Presence of cardiac pacemaker: Z95.0

## 2016-12-03 ENCOUNTER — Encounter (HOSPITAL_COMMUNITY)
Admission: RE | Disposition: A | Discharge status: Home / Self Care | Payer: Self-pay | Source: Ambulatory Visit | Attending: Ophthalmology

## 2016-12-03 ENCOUNTER — Ambulatory Visit (HOSPITAL_COMMUNITY): Payer: Medicare Other | Admitting: Anesthesiology

## 2016-12-03 ENCOUNTER — Ambulatory Visit (HOSPITAL_COMMUNITY)
Admission: RE | Admit: 2016-12-03 | Discharge: 2016-12-03 | Disposition: A | Discharge status: Home / Self Care | Payer: Medicare Other | Source: Ambulatory Visit | Attending: Ophthalmology | Admitting: Ophthalmology

## 2016-12-03 ENCOUNTER — Encounter (HOSPITAL_COMMUNITY): Payer: Self-pay

## 2016-12-03 DIAGNOSIS — E669 Obesity, unspecified: Secondary | ICD-10-CM | POA: Insufficient documentation

## 2016-12-03 DIAGNOSIS — H269 Unspecified cataract: Secondary | ICD-10-CM | POA: Diagnosis not present

## 2016-12-03 DIAGNOSIS — I739 Peripheral vascular disease, unspecified: Secondary | ICD-10-CM | POA: Diagnosis not present

## 2016-12-03 DIAGNOSIS — H2512 Age-related nuclear cataract, left eye: Secondary | ICD-10-CM | POA: Diagnosis not present

## 2016-12-03 DIAGNOSIS — I10 Essential (primary) hypertension: Secondary | ICD-10-CM | POA: Diagnosis not present

## 2016-12-03 DIAGNOSIS — I2510 Atherosclerotic heart disease of native coronary artery without angina pectoris: Secondary | ICD-10-CM | POA: Insufficient documentation

## 2016-12-03 DIAGNOSIS — K449 Diaphragmatic hernia without obstruction or gangrene: Secondary | ICD-10-CM | POA: Insufficient documentation

## 2016-12-03 DIAGNOSIS — M353 Polymyalgia rheumatica: Secondary | ICD-10-CM | POA: Diagnosis not present

## 2016-12-03 DIAGNOSIS — Z6831 Body mass index (BMI) 31.0-31.9, adult: Secondary | ICD-10-CM | POA: Insufficient documentation

## 2016-12-03 DIAGNOSIS — M797 Fibromyalgia: Secondary | ICD-10-CM | POA: Insufficient documentation

## 2016-12-03 HISTORY — PX: CATARACT EXTRACTION W/PHACO: SHX586

## 2016-12-03 SURGERY — PHACOEMULSIFICATION, CATARACT, WITH IOL INSERTION
Anesthesia: Monitor Anesthesia Care | Site: Eye | Laterality: Left

## 2016-12-03 MED ORDER — PHENYLEPHRINE HCL 2.5 % OP SOLN
1.0000 [drp] | OPHTHALMIC | Status: AC
  Administered 2016-12-03 (×3): 1 [drp] via OPHTHALMIC

## 2016-12-03 MED ORDER — MIDAZOLAM HCL 2 MG/2ML IJ SOLN
1.0000 mg | INTRAMUSCULAR | Status: AC
  Administered 2016-12-03: 1 mg via INTRAVENOUS

## 2016-12-03 MED ORDER — TETRACAINE HCL 0.5 % OP SOLN
1.0000 [drp] | OPHTHALMIC | Status: AC
  Administered 2016-12-03 (×3): 1 [drp] via OPHTHALMIC

## 2016-12-03 MED ORDER — FENTANYL CITRATE (PF) 100 MCG/2ML IJ SOLN
INTRAMUSCULAR | Status: AC
  Filled 2016-12-03: qty 2

## 2016-12-03 MED ORDER — LACTATED RINGERS IV SOLN
INTRAVENOUS | Status: DC
  Administered 2016-12-03: 07:00:00 via INTRAVENOUS

## 2016-12-03 MED ORDER — MIDAZOLAM HCL 2 MG/2ML IJ SOLN
INTRAMUSCULAR | Status: AC
  Filled 2016-12-03: qty 2

## 2016-12-03 MED ORDER — CYCLOPENTOLATE-PHENYLEPHRINE 0.2-1 % OP SOLN
1.0000 [drp] | OPHTHALMIC | Status: AC
  Administered 2016-12-03 (×3): 1 [drp] via OPHTHALMIC

## 2016-12-03 MED ORDER — BSS IO SOLN
INTRAOCULAR | Status: DC | PRN
  Administered 2016-12-03: 15 mL via INTRAOCULAR

## 2016-12-03 MED ORDER — PROVISC 10 MG/ML IO SOLN
INTRAOCULAR | Status: DC | PRN
  Administered 2016-12-03: 0.85 mL via INTRAOCULAR

## 2016-12-03 MED ORDER — POVIDONE-IODINE 5 % OP SOLN
OPHTHALMIC | Status: DC | PRN
  Administered 2016-12-03: 1 via OPHTHALMIC

## 2016-12-03 MED ORDER — FENTANYL CITRATE (PF) 100 MCG/2ML IJ SOLN
25.0000 ug | INTRAMUSCULAR | Status: AC
  Administered 2016-12-03: 25 ug via INTRAVENOUS

## 2016-12-03 MED ORDER — EPINEPHRINE PF 1 MG/ML IJ SOLN
INTRAOCULAR | Status: DC | PRN
  Administered 2016-12-03: 500 mL

## 2016-12-03 MED ORDER — NEOMYCIN-POLYMYXIN-DEXAMETH 3.5-10000-0.1 OP SUSP
OPHTHALMIC | Status: DC | PRN
  Administered 2016-12-03: 1 [drp] via OPHTHALMIC

## 2016-12-03 MED ORDER — LIDOCAINE HCL (PF) 1 % IJ SOLN
INTRAMUSCULAR | Status: DC | PRN
  Administered 2016-12-03: .5 mL

## 2016-12-03 MED ORDER — EPINEPHRINE PF 1 MG/ML IJ SOLN
INTRAMUSCULAR | Status: AC
  Filled 2016-12-03: qty 1

## 2016-12-03 MED ORDER — LIDOCAINE 3.5 % OP GEL OPTIME - NO CHARGE
OPHTHALMIC | Status: DC | PRN
  Administered 2016-12-03: 1 [drp] via OPHTHALMIC

## 2016-12-03 MED ORDER — LIDOCAINE HCL 3.5 % OP GEL
1.0000 "application " | Freq: Once | OPHTHALMIC | Status: AC
  Administered 2016-12-03: 1 via OPHTHALMIC

## 2016-12-03 SURGICAL SUPPLY — 23 items
CAPSULAR TENSION RING-AMO (OPHTHALMIC RELATED) IMPLANT
CLOTH BEACON ORANGE TIMEOUT ST (SAFETY) ×2 IMPLANT
EYE SHIELD UNIVERSAL CLEAR (GAUZE/BANDAGES/DRESSINGS) ×2 IMPLANT
GLOVE BIOGEL PI IND STRL 6.5 (GLOVE) IMPLANT
GLOVE BIOGEL PI IND STRL 7.0 (GLOVE) IMPLANT
GLOVE BIOGEL PI INDICATOR 6.5 (GLOVE)
GLOVE BIOGEL PI INDICATOR 7.0 (GLOVE) ×4
GLOVE EXAM NITRILE LRG STRL (GLOVE) IMPLANT
GLOVE EXAM NITRILE MD LF STRL (GLOVE) IMPLANT
KIT VITRECTOMY (OPHTHALMIC RELATED) IMPLANT
PAD ARMBOARD 7.5X6 YLW CONV (MISCELLANEOUS) ×2 IMPLANT
PROC W NO LENS (INTRAOCULAR LENS)
PROC W SPEC LENS (INTRAOCULAR LENS)
PROCESS W NO LENS (INTRAOCULAR LENS) IMPLANT
PROCESS W SPEC LENS (INTRAOCULAR LENS) IMPLANT
RETRACTOR IRIS SIGHTPATH (OPHTHALMIC RELATED) IMPLANT
RING MALYGIN (MISCELLANEOUS) IMPLANT
SIGHTPATH CAT PROC W REG LENS (Ophthalmic Related) ×3 IMPLANT
SYRINGE LUER LOK 1CC (MISCELLANEOUS) ×2 IMPLANT
TAPE SURG TRANSPORE 1 IN (GAUZE/BANDAGES/DRESSINGS) IMPLANT
TAPE SURGICAL TRANSPORE 1 IN (GAUZE/BANDAGES/DRESSINGS) ×2
VISCOELASTIC ADDITIONAL (OPHTHALMIC RELATED) IMPLANT
WATER STERILE IRR 250ML POUR (IV SOLUTION) ×2 IMPLANT

## 2016-12-03 NOTE — Transfer of Care (Signed)
Immediate Anesthesia Transfer of Care Note  Patient: Douglas [name]  Procedure(s) Performed: Procedure(s) with comments: CATARACT EXTRACTION PHACO AND INTRAOCULAR LENS PLACEMENT LEFT EYE (Left) - CDE:  10.87  Patient Location: Short Stay  Anesthesia Type:MAC  Level of Consciousness: awake and patient cooperative  Airway & Oxygen Therapy: Patient Spontanous Breathing  Post-op Assessment: Report given to RN and Post -op Vital signs reviewed and stable  Post vital signs: Reviewed and stable  Last Vitals:  Vitals:   12/03/16 0735 12/03/16 0740  BP: 129/68 113/63  Pulse:    Resp: 16 14  Temp:      Last Pain:  Vitals:   12/03/16 0648  TempSrc: Oral      Patients Stated Pain Goal: 7 (01/60/10 9323)  Complications: No apparent anesthesia complications

## 2016-12-03 NOTE — Anesthesia Postprocedure Evaluation (Signed)
Anesthesia Post Note  Patient: Douglas [name]  Procedure(s) Performed: Procedure(s) (LRB): CATARACT EXTRACTION PHACO AND INTRAOCULAR LENS PLACEMENT LEFT EYE (Left)  Patient location during evaluation: Short Stay Anesthesia Type: MAC Level of consciousness: awake, oriented and patient cooperative Pain management: pain level controlled Vital Signs Assessment: post-procedure vital signs reviewed and stable Respiratory status: spontaneous breathing, nonlabored ventilation and respiratory function stable Cardiovascular status: blood pressure returned to baseline Postop Assessment: no signs of nausea or vomiting Anesthetic complications: no     Last Vitals:  Vitals:   12/03/16 0735 12/03/16 0740  BP: 129/68 113/63  Pulse:    Resp: 16 14  Temp:      Last Pain:  Vitals:   12/03/16 0648  TempSrc: Oral                 Patt Steinhardt J

## 2016-12-03 NOTE — H&P (Signed)
I have reviewed the H&P, the patient was re-examined, and I have identified no interval changes in medical condition and plan of care since the history and physical of record  

## 2016-12-03 NOTE — Op Note (Signed)
Date of Admission: 12/03/2016  Date of Surgery: 12/03/2016  Pre-Op Dx: Cataract Left  Eye  Post-Op Dx: Senile Nuclear Cataract  Left  Eye,  Dx Code H25.12  Surgeon: Tonny Branch, M.D.  Assistants: None  Anesthesia: Topical with MAC  Indications: Painless, progressive loss of vision with compromise of daily activities.  Surgery: Cataract Extraction with Intraocular lens Implant Left Eye  Discription: The patient had dilating drops and viscous lidocaine placed into the Left eye in the pre-op holding area. After transfer to the operating room, a time out was performed. The patient was then prepped and draped. Beginning with a 38 degree blade a paracentesis port was made at the surgeon's 2 o'clock position. The anterior chamber was then filled with 1% non-preserved lidocaine. This was followed by filling the anterior chamber with Provisc.  A 2.65m keratome blade was used to make a clear corneal incision at the temporal limbus.  A bent cystatome needle was used to create a continuous tear capsulotomy. Hydrodissection was performed with balanced salt solution on a Fine canula. The lens nucleus was then removed using the phacoemulsification handpiece. Residual cortex was removed with the I&A handpiece. The anterior chamber and capsular bag were refilled with Provisc. A posterior chamber intraocular lens was placed into the capsular bag with it's injector. The implant was positioned with the Kuglan hook. The Provisc was then removed from the anterior chamber and capsular bag with the I&A handpiece. Stromal hydration of the main incision and paracentesis port was performed with BSS on a Fine canula. The wounds were tested for leak which was negative. The patient tolerated the procedure well. There were no operative complications. The patient was then transferred to the recovery room in stable condition.  Complications: None  Specimen: None  EBL: None  Prosthetic device: Abbott Technis, PCB00, power  21.5, SN [id number]

## 2016-12-03 NOTE — Anesthesia Preprocedure Evaluation (Signed)
Anesthesia Evaluation  Patient identified by MRN, date of birth, ID band Patient awake    Reviewed: Allergy & Precautions, H&P , NPO status , Patient's Chart, lab work & pertinent test results, reviewed documented beta blocker date and time   History of Anesthesia Complications (+) DIFFICULT AIRWAY  Airway Mallampati: III  TM Distance: >3 FB Neck ROM: Full    Dental  (+) Teeth Intact   Pulmonary neg pulmonary ROS,    breath sounds clear to auscultation       Cardiovascular hypertension, Pt. on medications and Pt. on home beta blockers + CAD and + Peripheral Vascular Disease  + dysrhythmias + pacemaker + Valvular Problems/Murmurs AS  Rhythm:Regular Rate:Normal     Neuro/Psych    GI/Hepatic hiatal hernia,   Endo/Other  PMR   Renal/GU      Musculoskeletal  (+) Fibromyalgia -  Abdominal (+) + obese,  Abdomen: soft.    Peds  Hematology   Anesthesia Other Findings   Reproductive/Obstetrics                             Anesthesia Physical Anesthesia Plan  ASA: III  Anesthesia Plan: MAC   Post-op Pain Management:    Induction: Intravenous  Airway Management Planned: Nasal Cannula  Additional Equipment:   Intra-op Plan:   Post-operative Plan:   Informed Consent: I have reviewed the patients History and Physical, chart, labs and discussed the procedure including the risks, benefits and alternatives for the proposed anesthesia with the patient or authorized representative who has indicated his/her understanding and acceptance.     Plan Discussed with:   Anesthesia Plan Comments:         Anesthesia Quick Evaluation

## 2016-12-03 NOTE — Discharge Instructions (Signed)
PATIENT INSTRUCTIONS POST-ANESTHESIA  IMMEDIATELY FOLLOWING SURGERY:  Do not drive or operate machinery for the first twenty four hours after surgery.  Do not make any important decisions for twenty four hours after surgery or while taking narcotic pain medications or sedatives.  If you develop intractable nausea and vomiting or a severe headache please notify your doctor immediately.  FOLLOW-UP:  Please make an appointment with your surgeon as instructed. You do not need to follow up with anesthesia unless specifically instructed to do so.  WOUND CARE INSTRUCTIONS (if applicable):  Keep a dry clean dressing on the anesthesia/puncture wound site if there is drainage.  Once the wound has quit draining you may leave it open to air.  Generally you should leave the bandage intact for twenty four hours unless there is drainage.  If the epidural site drains for more than 36-48 hours please call the anesthesia department.  QUESTIONS?:  Please feel free to call your physician or the hospital operator if you have any questions, and they will be happy to assist you.      PATIENT INSTRUCTIONS POST-ANESTHESIA  IMMEDIATELY FOLLOWING SURGERY:  Do not drive or operate machinery for the first twenty four hours after surgery.  Do not make any important decisions for twenty four hours after surgery or while taking narcotic pain medications or sedatives.  If you develop intractable nausea and vomiting or a severe headache please notify your doctor immediately.  FOLLOW-UP:  Please make an appointment with your surgeon as instructed. You do not need to follow up with anesthesia unless specifically instructed to do so.  WOUND CARE INSTRUCTIONS (if applicable):  Keep a dry clean dressing on the anesthesia/puncture wound site if there is drainage.  Once the wound has quit draining you may leave it open to air.  Generally you should leave the bandage intact for twenty four hours unless there is drainage.  If the epidural  site drains for more than 36-48 hours please call the anesthesia department.  QUESTIONS?:  Please feel free to call your physician or the hospital operator if you have any questions, and they will be happy to assist you.      PATIENT INSTRUCTIONS POST-ANESTHESIA  IMMEDIATELY FOLLOWING SURGERY:  Do not drive or operate machinery for the first twenty four hours after surgery.  Do not make any important decisions for twenty four hours after surgery or while taking narcotic pain medications or sedatives.  If you develop intractable nausea and vomiting or a severe headache please notify your doctor immediately.  FOLLOW-UP:  Please make an appointment with your surgeon as instructed. You do not need to follow up with anesthesia unless specifically instructed to do so.  WOUND CARE INSTRUCTIONS (if applicable):  Keep a dry clean dressing on the anesthesia/puncture wound site if there is drainage.  Once the wound has quit draining you may leave it open to air.  Generally you should leave the bandage intact for twenty four hours unless there is drainage.  If the epidural site drains for more than 36-48 hours please call the anesthesia department.  QUESTIONS?:  Please feel free to call your physician or the hospital operator if you have any questions, and they will be happy to assist you.

## 2016-12-04 ENCOUNTER — Encounter (HOSPITAL_COMMUNITY): Payer: Self-pay | Admitting: Ophthalmology

## 2016-12-06 ENCOUNTER — Other Ambulatory Visit (HOSPITAL_COMMUNITY): Payer: Self-pay | Admitting: Oncology

## 2016-12-06 ENCOUNTER — Encounter (HOSPITAL_COMMUNITY): Payer: Medicare Other | Attending: Oncology

## 2016-12-06 DIAGNOSIS — D500 Iron deficiency anemia secondary to blood loss (chronic): Secondary | ICD-10-CM | POA: Insufficient documentation

## 2016-12-06 LAB — CBC WITH DIFFERENTIAL/PLATELET
BASOS PCT: 1 %
Basophils Absolute: 0.1 10*3/uL (ref 0.0–0.1)
EOS ABS: 0.5 10*3/uL (ref 0.0–0.7)
Eosinophils Relative: 6 %
HEMATOCRIT: 44.7 % (ref 39.0–52.0)
Hemoglobin: 14.3 g/dL (ref 13.0–17.0)
Lymphocytes Relative: 15 %
Lymphs Abs: 1.2 10*3/uL (ref 0.7–4.0)
MCH: 29.9 pg (ref 26.0–34.0)
MCHC: 32 g/dL (ref 30.0–36.0)
MCV: 93.5 fL (ref 78.0–100.0)
MONOS PCT: 8 %
Monocytes Absolute: 0.7 10*3/uL (ref 0.1–1.0)
NEUTROS ABS: 5.6 10*3/uL (ref 1.7–7.7)
Neutrophils Relative %: 70 %
Platelets: 161 10*3/uL (ref 150–400)
RBC: 4.78 MIL/uL (ref 4.22–5.81)
RDW: 17.7 % — ABNORMAL HIGH (ref 11.5–15.5)
WBC: 8 10*3/uL (ref 4.0–10.5)

## 2016-12-06 LAB — IRON AND TIBC
IRON: 85 ug/dL (ref 45–182)
SATURATION RATIOS: 24 % (ref 17.9–39.5)
TIBC: 349 ug/dL (ref 250–450)
UIBC: 264 ug/dL

## 2016-12-06 LAB — FERRITIN: FERRITIN: 28 ng/mL (ref 24–336)

## 2016-12-10 ENCOUNTER — Encounter (HOSPITAL_COMMUNITY)
Admission: RE | Admit: 2016-12-10 | Discharge: 2016-12-10 | Disposition: A | Discharge status: Home / Self Care | Payer: Medicare Other | Source: Ambulatory Visit | Attending: Ophthalmology | Admitting: Ophthalmology

## 2016-12-10 DIAGNOSIS — H2511 Age-related nuclear cataract, right eye: Secondary | ICD-10-CM | POA: Diagnosis not present

## 2016-12-12 ENCOUNTER — Encounter (HOSPITAL_COMMUNITY): Payer: Self-pay

## 2016-12-12 ENCOUNTER — Encounter (HOSPITAL_BASED_OUTPATIENT_CLINIC_OR_DEPARTMENT_OTHER): Payer: Medicare Other

## 2016-12-12 ENCOUNTER — Ambulatory Visit (INDEPENDENT_AMBULATORY_CARE_PROVIDER_SITE_OTHER): Payer: Medicare Other | Admitting: *Deleted

## 2016-12-12 VITALS — BP 115/62 | HR 76 | Temp 98.2°F | Resp 18

## 2016-12-12 DIAGNOSIS — D500 Iron deficiency anemia secondary to blood loss (chronic): Secondary | ICD-10-CM

## 2016-12-12 DIAGNOSIS — I4891 Unspecified atrial fibrillation: Secondary | ICD-10-CM

## 2016-12-12 DIAGNOSIS — Z952 Presence of prosthetic heart valve: Secondary | ICD-10-CM

## 2016-12-12 LAB — POCT INR: INR: 2

## 2016-12-12 MED ORDER — SODIUM CHLORIDE 0.9 % IV SOLN
Freq: Once | INTRAVENOUS | Status: AC
  Administered 2016-12-12: 14:00:00 via INTRAVENOUS

## 2016-12-12 MED ORDER — SODIUM CHLORIDE 0.9 % IV SOLN
510.0000 mg | Freq: Once | INTRAVENOUS | Status: AC
  Administered 2016-12-12: 510 mg via INTRAVENOUS
  Filled 2016-12-12: qty 17

## 2016-12-12 NOTE — Patient Instructions (Signed)
[facility] at [facility] Discharge Instructions  RECOMMENDATIONS MADE BY THE CONSULTANT AND ANY TEST RESULTS WILL BE SENT TO YOUR REFERRING PHYSICIAN.  IV iron today.    Thank you for choosing [facility] at [facility] to provide your oncology and hematology care.  To afford each patient quality time with our provider, please arrive at least 15 minutes before your scheduled appointment time.    If you have a lab appointment with the Cancer Center please come in thru the  Main Entrance and check in at the main information desk  You need to re-schedule your appointment should you arrive 10 or more minutes late.  We strive to give you quality time with our providers, and arriving late affects you and other patients whose appointments are after yours.  Also, if you no show three or more times for appointments you may be dismissed from the clinic at the providers discretion.     Again, thank you for choosing [facility].  Our hope is that these requests will decrease the amount of time that you wait before being seen by our physicians.       _____________________________________________________________  Should you have questions after your visit to [facility], please contact our office at [phone] between the hours of 8:30 a.m. and 4:30 p.m.  Voicemails left after 4:30 p.m. will not be returned until the following business day.  For prescription refill requests, have your pharmacy contact our office.       Resources For Cancer Patients and their Caregivers ? American Cancer Society: Can assist with transportation, wigs, general needs, runs Look Good Feel Better.        1-888-227-6333 ? Cancer Care: Provides financial assistance, online support groups, medication/co-pay assistance.  1-800-813-HOPE (4673) ? [facility] Assists [place] cancer patients and their families through emotional  , educational and financial support.  336-427-4357 ? [place] DSS Where to apply for food stamps, Medicaid and utility assistance. 336-342-1394 ? RCATS: Transportation to medical appointments. 336-347-2287 ? Social Security Administration: May apply for disability if have a Stage IV cancer. 336-342-7796 1-800-772-1213 ? [place] Aging, Disability and Transit Services: Assists with nutrition, care and transit needs. 336-349-2343  Cancer Center Support Programs: @10RELATIVEDAYS@ > Cancer Support Group  2nd Tuesday of the month 1pm-2pm, Journey Room  > Creative Journey  3rd Tuesday of the month 1130am-1pm, Journey Room  > Look Good Feel Better  1st Wednesday of the month 10am-12 noon, Journey Room (Call American [employer] to register 1-800-395-5775)    

## 2016-12-12 NOTE — Progress Notes (Signed)
Laurena Slimmer tolerated Feraheme well without complaints or incident. VSS upon discharge. Pt discharged self ambulatory in satisfactory condition

## 2016-12-13 ENCOUNTER — Ambulatory Visit (HOSPITAL_COMMUNITY): Payer: Medicare Other | Admitting: Anesthesiology

## 2016-12-13 ENCOUNTER — Encounter (HOSPITAL_COMMUNITY): Payer: Self-pay

## 2016-12-13 ENCOUNTER — Ambulatory Visit (HOSPITAL_COMMUNITY)
Admission: RE | Admit: 2016-12-13 | Discharge: 2016-12-13 | Disposition: A | Discharge status: Home / Self Care | Payer: Medicare Other | Source: Ambulatory Visit | Attending: Ophthalmology | Admitting: Ophthalmology

## 2016-12-13 ENCOUNTER — Encounter (HOSPITAL_COMMUNITY)
Admission: RE | Disposition: A | Discharge status: Home / Self Care | Payer: Self-pay | Source: Ambulatory Visit | Attending: Ophthalmology

## 2016-12-13 DIAGNOSIS — Z841 Family history of disorders of kidney and ureter: Secondary | ICD-10-CM | POA: Insufficient documentation

## 2016-12-13 DIAGNOSIS — Z79899 Other long term (current) drug therapy: Secondary | ICD-10-CM | POA: Insufficient documentation

## 2016-12-13 DIAGNOSIS — Z950 Presence of cardiac pacemaker: Secondary | ICD-10-CM | POA: Insufficient documentation

## 2016-12-13 DIAGNOSIS — I739 Peripheral vascular disease, unspecified: Secondary | ICD-10-CM | POA: Diagnosis not present

## 2016-12-13 DIAGNOSIS — M797 Fibromyalgia: Secondary | ICD-10-CM | POA: Insufficient documentation

## 2016-12-13 DIAGNOSIS — Z7901 Long term (current) use of anticoagulants: Secondary | ICD-10-CM | POA: Diagnosis not present

## 2016-12-13 DIAGNOSIS — Z85828 Personal history of other malignant neoplasm of skin: Secondary | ICD-10-CM | POA: Insufficient documentation

## 2016-12-13 DIAGNOSIS — Z8249 Family history of ischemic heart disease and other diseases of the circulatory system: Secondary | ICD-10-CM | POA: Diagnosis not present

## 2016-12-13 DIAGNOSIS — H2511 Age-related nuclear cataract, right eye: Secondary | ICD-10-CM | POA: Insufficient documentation

## 2016-12-13 DIAGNOSIS — Z951 Presence of aortocoronary bypass graft: Secondary | ICD-10-CM | POA: Diagnosis not present

## 2016-12-13 DIAGNOSIS — Z9842 Cataract extraction status, left eye: Secondary | ICD-10-CM | POA: Insufficient documentation

## 2016-12-13 DIAGNOSIS — M109 Gout, unspecified: Secondary | ICD-10-CM | POA: Diagnosis not present

## 2016-12-13 DIAGNOSIS — I10 Essential (primary) hypertension: Secondary | ICD-10-CM | POA: Insufficient documentation

## 2016-12-13 DIAGNOSIS — I2510 Atherosclerotic heart disease of native coronary artery without angina pectoris: Secondary | ICD-10-CM | POA: Insufficient documentation

## 2016-12-13 DIAGNOSIS — Z9889 Other specified postprocedural states: Secondary | ICD-10-CM | POA: Diagnosis not present

## 2016-12-13 DIAGNOSIS — Z961 Presence of intraocular lens: Secondary | ICD-10-CM | POA: Insufficient documentation

## 2016-12-13 DIAGNOSIS — I4891 Unspecified atrial fibrillation: Secondary | ICD-10-CM | POA: Insufficient documentation

## 2016-12-13 DIAGNOSIS — Z86010 Personal history of colonic polyps: Secondary | ICD-10-CM | POA: Insufficient documentation

## 2016-12-13 DIAGNOSIS — Z952 Presence of prosthetic heart valve: Secondary | ICD-10-CM | POA: Diagnosis not present

## 2016-12-13 DIAGNOSIS — M353 Polymyalgia rheumatica: Secondary | ICD-10-CM | POA: Insufficient documentation

## 2016-12-13 HISTORY — PX: CATARACT EXTRACTION W/PHACO: SHX586

## 2016-12-13 SURGERY — PHACOEMULSIFICATION, CATARACT, WITH IOL INSERTION
Anesthesia: Monitor Anesthesia Care | Site: Eye | Laterality: Right

## 2016-12-13 MED ORDER — LIDOCAINE HCL (PF) 1 % IJ SOLN
INTRAMUSCULAR | Status: DC | PRN
  Administered 2016-12-13: .5 mL

## 2016-12-13 MED ORDER — PHENYLEPHRINE HCL 2.5 % OP SOLN
1.0000 [drp] | OPHTHALMIC | Status: AC
  Administered 2016-12-13 (×3): 1 [drp] via OPHTHALMIC

## 2016-12-13 MED ORDER — EPINEPHRINE PF 1 MG/ML IJ SOLN
INTRAOCULAR | Status: DC | PRN
  Administered 2016-12-13: 500 mL

## 2016-12-13 MED ORDER — LACTATED RINGERS IV SOLN
INTRAVENOUS | Status: DC
  Administered 2016-12-13: 12:00:00 via INTRAVENOUS

## 2016-12-13 MED ORDER — FENTANYL CITRATE (PF) 100 MCG/2ML IJ SOLN
INTRAMUSCULAR | Status: AC
  Filled 2016-12-13: qty 2

## 2016-12-13 MED ORDER — PROVISC 10 MG/ML IO SOLN
INTRAOCULAR | Status: DC | PRN
  Administered 2016-12-13: 0.85 mL via INTRAOCULAR

## 2016-12-13 MED ORDER — LIDOCAINE HCL 3.5 % OP GEL
1.0000 "application " | Freq: Once | OPHTHALMIC | Status: AC
  Administered 2016-12-13: 1 via OPHTHALMIC

## 2016-12-13 MED ORDER — MIDAZOLAM HCL 2 MG/2ML IJ SOLN
INTRAMUSCULAR | Status: AC
  Filled 2016-12-13: qty 2

## 2016-12-13 MED ORDER — CYCLOPENTOLATE-PHENYLEPHRINE 0.2-1 % OP SOLN
1.0000 [drp] | OPHTHALMIC | Status: AC
  Administered 2016-12-13 (×3): 1 [drp] via OPHTHALMIC

## 2016-12-13 MED ORDER — TETRACAINE HCL 0.5 % OP SOLN
1.0000 [drp] | OPHTHALMIC | Status: AC
  Administered 2016-12-13 (×3): 1 [drp] via OPHTHALMIC

## 2016-12-13 MED ORDER — FENTANYL CITRATE (PF) 100 MCG/2ML IJ SOLN
25.0000 ug | Freq: Once | INTRAMUSCULAR | Status: AC
  Administered 2016-12-13: 25 ug via INTRAVENOUS

## 2016-12-13 MED ORDER — MIDAZOLAM HCL 2 MG/2ML IJ SOLN
1.0000 mg | INTRAMUSCULAR | Status: AC
  Administered 2016-12-13 (×2): 1 mg via INTRAVENOUS

## 2016-12-13 MED ORDER — POVIDONE-IODINE 5 % OP SOLN
OPHTHALMIC | Status: DC | PRN
Start: 2016-12-13 — End: 2016-12-13
  Administered 2016-12-13: 1 via OPHTHALMIC

## 2016-12-13 MED ORDER — BSS IO SOLN
INTRAOCULAR | Status: DC | PRN
  Administered 2016-12-13: 15 mL

## 2016-12-13 MED ORDER — NEOMYCIN-POLYMYXIN-DEXAMETH 3.5-10000-0.1 OP SUSP
OPHTHALMIC | Status: DC | PRN
  Administered 2016-12-13: 2 [drp] via OPHTHALMIC

## 2016-12-13 SURGICAL SUPPLY — 13 items
CLOTH BEACON ORANGE TIMEOUT ST (SAFETY) ×2 IMPLANT
EYE SHIELD UNIVERSAL CLEAR (GAUZE/BANDAGES/DRESSINGS) ×2 IMPLANT
GLOVE BIOGEL PI IND STRL 6.5 (GLOVE) IMPLANT
GLOVE BIOGEL PI IND STRL 7.0 (GLOVE) IMPLANT
GLOVE BIOGEL PI INDICATOR 6.5 (GLOVE) ×2
GLOVE BIOGEL PI INDICATOR 7.0 (GLOVE) ×2
GLOVE EXAM NITRILE LRG STRL (GLOVE) ×2 IMPLANT
KIT VITRECTOMY (OPHTHALMIC RELATED) ×2 IMPLANT
PAD ARMBOARD 7.5X6 YLW CONV (MISCELLANEOUS) ×2 IMPLANT
SIGHTPATH CAT PROC W REG LENS (Ophthalmic Related) ×3 IMPLANT
SYRINGE LUER LOK 1CC (MISCELLANEOUS) ×2 IMPLANT
TAPE PAPER 1X10 WHT MICROPORE (GAUZE/BANDAGES/DRESSINGS) ×2 IMPLANT
WATER STERILE IRR 250ML POUR (IV SOLUTION) ×2 IMPLANT

## 2016-12-13 NOTE — H&P (Signed)
I have reviewed the H&P, the patient was re-examined, and I have identified no interval changes in medical condition and plan of care since the history and physical of record  

## 2016-12-13 NOTE — Transfer of Care (Signed)
Immediate Anesthesia Transfer of Care Note  Patient: Douglas [name]  Procedure(s) Performed: Procedure(s) with comments: CATARACT EXTRACTION PHACO AND INTRAOCULAR LENS PLACEMENT (IOC) (Right) - right cde 11.78  Patient Location: Short Stay  Anesthesia Type:MAC  Level of Consciousness: awake  Airway & Oxygen Therapy: Patient Spontanous Breathing  Post-op Assessment: Report given to RN, Post -op Vital signs reviewed and stable and Patient moving all extremities  Post vital signs: Reviewed and stable  Last Vitals:  Vitals:   12/13/16 1157  BP: 125/68  Pulse: 70  Resp: 18  Temp: 36.7 C    Last Pain:  Vitals:   12/13/16 1157  TempSrc: Oral  PainSc: 4       Patients Stated Pain Goal: 5 (74/12/87 8676)  Complications: No apparent anesthesia complications

## 2016-12-13 NOTE — Anesthesia Postprocedure Evaluation (Signed)
Anesthesia Post Note  Patient: Douglas [name]  Procedure(s) Performed: Procedure(s) (LRB): CATARACT EXTRACTION PHACO AND INTRAOCULAR LENS PLACEMENT (IOC) (Right)  Patient location during evaluation: Short Stay Anesthesia Type: MAC Level of consciousness: awake, oriented and patient cooperative Pain management: pain level controlled Vital Signs Assessment: post-procedure vital signs reviewed and stable Respiratory status: spontaneous breathing, nonlabored ventilation and respiratory function stable Cardiovascular status: blood pressure returned to baseline Postop Assessment: no signs of nausea or vomiting Anesthetic complications: no     Last Vitals:  Vitals:   12/13/16 1157  BP: 125/68  Pulse: 70  Resp: 18  Temp: 36.7 C    Last Pain:  Vitals:   12/13/16 1157  TempSrc: Oral  PainSc: 4                  Kyandre Okray J

## 2016-12-13 NOTE — Anesthesia Preprocedure Evaluation (Signed)
Anesthesia Evaluation  Patient identified by MRN, date of birth, ID band Patient awake    Reviewed: Allergy & Precautions, H&P , NPO status , Patient's Chart, lab work & pertinent test results, reviewed documented beta blocker date and time   History of Anesthesia Complications (+) DIFFICULT AIRWAY  Airway Mallampati: III  TM Distance: >3 FB Neck ROM: Full    Dental  (+) Teeth Intact   Pulmonary neg pulmonary ROS,    breath sounds clear to auscultation       Cardiovascular hypertension, Pt. on medications and Pt. on home beta blockers + CAD and + Peripheral Vascular Disease  + dysrhythmias + pacemaker + Valvular Problems/Murmurs AS  Rhythm:Regular Rate:Normal     Neuro/Psych    GI/Hepatic hiatal hernia,   Endo/Other  PMR   Renal/GU      Musculoskeletal  (+) Fibromyalgia -  Abdominal (+) + obese,  Abdomen: soft.    Peds  Hematology   Anesthesia Other Findings   Reproductive/Obstetrics                             Anesthesia Physical Anesthesia Plan  ASA: III  Anesthesia Plan: MAC   Post-op Pain Management:    Induction: Intravenous  Airway Management Planned: Nasal Cannula  Additional Equipment:   Intra-op Plan:   Post-operative Plan:   Informed Consent: I have reviewed the patients History and Physical, chart, labs and discussed the procedure including the risks, benefits and alternatives for the proposed anesthesia with the patient or authorized representative who has indicated his/her understanding and acceptance.     Plan Discussed with:   Anesthesia Plan Comments:         Anesthesia Quick Evaluation

## 2016-12-13 NOTE — Discharge Instructions (Signed)

## 2016-12-13 NOTE — Op Note (Signed)
Date of Admission: 12/13/2016  Date of Surgery: 12/13/2016  Pre-Op Dx: Cataract Right  Eye  Post-Op Dx: Senile Nuclear Cataract  Right  Eye,  Dx Code H25.11  Surgeon: Tonny Branch, M.D.  Assistants: None  Anesthesia: Topical with MAC  Indications: Painless, progressive loss of vision with compromise of daily activities.  Surgery: Cataract Extraction with Intraocular lens Implant Right Eye  Discription: The patient had dilating drops and viscous lidocaine placed into the Right eye in the pre-op holding area. After transfer to the operating room, a time out was performed. The patient was then prepped and draped. Beginning with a 51 degree blade a paracentesis port was made at the surgeon's 2 o'clock position. The anterior chamber was then filled with 1% non-preserved lidocaine. This was followed by filling the anterior chamber with Provisc.  A 2.44m keratome blade was used to make a clear corneal incision at the temporal limbus.  A bent cystatome needle was used to create a continuous tear capsulotomy. Hydrodissection was performed with balanced salt solution on a Fine canula. The lens nucleus was then removed using the phacoemulsification handpiece. Residual cortex was removed with the I&A handpiece. The anterior chamber and capsular bag were refilled with Provisc. A posterior chamber intraocular lens was placed into the capsular bag with it's injector. The implant was positioned with the Kuglan hook. The Provisc was then removed from the anterior chamber and capsular bag with the I&A handpiece. Stromal hydration of the main incision and paracentesis port was performed with BSS on a Fine canula. The wounds were tested for leak which was negative. The patient tolerated the procedure well. There were no operative complications. The patient was then transferred to the recovery room in stable condition.  Complications: None  Specimen: None  EBL: None  Prosthetic device: Abbott Technis, PCB00, power  21.5, SN [id number]

## 2016-12-17 ENCOUNTER — Encounter (HOSPITAL_COMMUNITY): Payer: Self-pay | Admitting: Ophthalmology

## 2016-12-19 DIAGNOSIS — R197 Diarrhea, unspecified: Secondary | ICD-10-CM | POA: Diagnosis not present

## 2016-12-19 DIAGNOSIS — D509 Iron deficiency anemia, unspecified: Secondary | ICD-10-CM | POA: Diagnosis not present

## 2016-12-21 DIAGNOSIS — R197 Diarrhea, unspecified: Secondary | ICD-10-CM | POA: Diagnosis not present

## 2016-12-27 ENCOUNTER — Other Ambulatory Visit: Payer: Self-pay | Admitting: Cardiology

## 2016-12-27 ENCOUNTER — Encounter (HOSPITAL_COMMUNITY): Payer: Medicare Other | Attending: Oncology

## 2016-12-27 DIAGNOSIS — D500 Iron deficiency anemia secondary to blood loss (chronic): Secondary | ICD-10-CM

## 2016-12-27 LAB — CBC WITH DIFFERENTIAL/PLATELET
BASOS PCT: 1 %
Basophils Absolute: 0.1 10*3/uL (ref 0.0–0.1)
Eosinophils Absolute: 0.6 10*3/uL (ref 0.0–0.7)
Eosinophils Relative: 7 %
HEMATOCRIT: 43.8 % (ref 39.0–52.0)
Hemoglobin: 14.4 g/dL (ref 13.0–17.0)
Lymphocytes Relative: 18 %
Lymphs Abs: 1.5 10*3/uL (ref 0.7–4.0)
MCH: 31 pg (ref 26.0–34.0)
MCHC: 32.9 g/dL (ref 30.0–36.0)
MCV: 94.4 fL (ref 78.0–100.0)
MONO ABS: 0.7 10*3/uL (ref 0.1–1.0)
MONOS PCT: 8 %
NEUTROS ABS: 5.8 10*3/uL (ref 1.7–7.7)
Neutrophils Relative %: 66 %
Platelets: 170 10*3/uL (ref 150–400)
RBC: 4.64 MIL/uL (ref 4.22–5.81)
RDW: 16.7 % — AB (ref 11.5–15.5)
WBC: 8.7 10*3/uL (ref 4.0–10.5)

## 2016-12-27 LAB — IRON AND TIBC
Iron: 78 ug/dL (ref 45–182)
Saturation Ratios: 25 % (ref 17.9–39.5)
TIBC: 307 ug/dL (ref 250–450)
UIBC: 229 ug/dL

## 2016-12-27 LAB — FERRITIN: Ferritin: 215 ng/mL (ref 24–336)

## 2016-12-31 ENCOUNTER — Ambulatory Visit (INDEPENDENT_AMBULATORY_CARE_PROVIDER_SITE_OTHER): Payer: Medicare Other | Admitting: *Deleted

## 2016-12-31 DIAGNOSIS — I442 Atrioventricular block, complete: Secondary | ICD-10-CM

## 2017-01-01 NOTE — Progress Notes (Signed)
Remote pacemaker transmission.   

## 2017-01-02 ENCOUNTER — Encounter: Payer: Self-pay | Admitting: Cardiology

## 2017-01-02 LAB — CUP PACEART REMOTE DEVICE CHECK
Battery Impedance: 377 Ohm
Battery Remaining Longevity: 95 mo
Battery Voltage: 2.77 V
Brady Statistic AS VS Percent: 0 %
Implantable Lead Implant Date: 20130412
Implantable Lead Location: 753859
Implantable Lead Model: 5076
Implantable Lead Model: 5076
Implantable Pulse Generator Implant Date: 20130412
Lead Channel Impedance Value: 552 Ohm
Lead Channel Setting Pacing Amplitude: 2 V
Lead Channel Setting Pacing Pulse Width: 0.4 ms
Lead Channel Setting Sensing Sensitivity: 4 mV
MDC IDC LEAD IMPLANT DT: 20130412
MDC IDC LEAD LOCATION: 753860
MDC IDC MSMT LEADCHNL RA IMPEDANCE VALUE: 486 Ohm
MDC IDC MSMT LEADCHNL RA SENSING INTR AMPL: 0.7 mV
MDC IDC MSMT LEADCHNL RV PACING THRESHOLD AMPLITUDE: 0.625 V
MDC IDC MSMT LEADCHNL RV PACING THRESHOLD PULSEWIDTH: 0.4 ms
MDC IDC SESS DTM: 20180312131927
MDC IDC SET LEADCHNL RV PACING AMPLITUDE: 2 V
MDC IDC STAT BRADY AP VP PERCENT: 68 %
MDC IDC STAT BRADY AP VS PERCENT: 0 %
MDC IDC STAT BRADY AS VP PERCENT: 32 %

## 2017-01-07 DIAGNOSIS — H524 Presbyopia: Secondary | ICD-10-CM | POA: Diagnosis not present

## 2017-01-07 DIAGNOSIS — H52223 Regular astigmatism, bilateral: Secondary | ICD-10-CM | POA: Diagnosis not present

## 2017-01-17 ENCOUNTER — Encounter (HOSPITAL_COMMUNITY): Payer: Self-pay | Admitting: Oncology

## 2017-01-17 ENCOUNTER — Ambulatory Visit (HOSPITAL_COMMUNITY): Payer: Medicare Other | Admitting: Oncology

## 2017-01-17 ENCOUNTER — Encounter: Payer: Self-pay | Admitting: Cardiology

## 2017-01-17 ENCOUNTER — Encounter (HOSPITAL_BASED_OUTPATIENT_CLINIC_OR_DEPARTMENT_OTHER): Payer: Medicare Other | Admitting: Oncology

## 2017-01-17 ENCOUNTER — Encounter (HOSPITAL_COMMUNITY): Payer: Medicare Other

## 2017-01-17 ENCOUNTER — Other Ambulatory Visit (HOSPITAL_COMMUNITY): Payer: Medicare Other

## 2017-01-17 DIAGNOSIS — D500 Iron deficiency anemia secondary to blood loss (chronic): Secondary | ICD-10-CM

## 2017-01-17 LAB — BASIC METABOLIC PANEL
Anion gap: 7 (ref 5–15)
BUN: 18 mg/dL (ref 6–20)
CHLORIDE: 103 mmol/L (ref 101–111)
CO2: 30 mmol/L (ref 22–32)
CREATININE: 1.34 mg/dL — AB (ref 0.61–1.24)
Calcium: 8.6 mg/dL — ABNORMAL LOW (ref 8.9–10.3)
GFR calc Af Amer: 53 mL/min — ABNORMAL LOW (ref >60)
GFR calc non Af Amer: 46 mL/min — ABNORMAL LOW (ref >60)
Glucose, Bld: 112 mg/dL — ABNORMAL HIGH (ref 65–99)
POTASSIUM: 3.5 mmol/L (ref 3.5–5.1)
Sodium: 140 mmol/L (ref 135–145)

## 2017-01-17 LAB — CBC WITH DIFFERENTIAL/PLATELET
Basophils Absolute: 0 10*3/uL (ref 0.0–0.1)
Basophils Relative: 1 %
EOS ABS: 0.4 10*3/uL (ref 0.0–0.7)
EOS PCT: 6 %
HCT: 41.4 % (ref 39.0–52.0)
Hemoglobin: 13.6 g/dL (ref 13.0–17.0)
LYMPHS PCT: 19 %
Lymphs Abs: 1.2 10*3/uL (ref 0.7–4.0)
MCH: 31.7 pg (ref 26.0–34.0)
MCHC: 32.9 g/dL (ref 30.0–36.0)
MCV: 96.5 fL (ref 78.0–100.0)
Monocytes Absolute: 0.4 10*3/uL (ref 0.1–1.0)
Monocytes Relative: 7 %
Neutro Abs: 4.4 10*3/uL (ref 1.7–7.7)
Neutrophils Relative %: 67 %
PLATELETS: 156 10*3/uL (ref 150–400)
RBC: 4.29 MIL/uL (ref 4.22–5.81)
RDW: 15.8 % — ABNORMAL HIGH (ref 11.5–15.5)
WBC: 6.5 10*3/uL (ref 4.0–10.5)

## 2017-01-17 LAB — IRON AND TIBC
Iron: 65 ug/dL (ref 45–182)
SATURATION RATIOS: 22 % (ref 17.9–39.5)
TIBC: 291 ug/dL (ref 250–450)
UIBC: 226 ug/dL

## 2017-01-17 LAB — FERRITIN: Ferritin: 69 ng/mL (ref 24–336)

## 2017-01-17 NOTE — Progress Notes (Signed)
[name], MD [place] [address]  Iron deficiency anemia due to chronic blood loss - Plan: CBC with Differential, Basic metabolic panel, Iron and TIBC, Ferritin  CURRENT THERAPY: IV iron when indicated.  INTERVAL HISTORY: Douglas [name] 83 y.o. male returns for followup of iron deficiency anemia, secondary to chronic GI blood loss with AVMs; further complicated by vitamin K antagonist anticoagulation and intolerance to PO iron therapy (ferrous sulfate) with constipation.  He denies any blood in his stools or black stools.  He denies any gross hematuria.  He reports a new development of loose stools 1-2 per day which is abnormal.  Again, he denies any blood in his stools.  Denies any mucus in his loose bowel movements as well.  He has seen his primary care physician who has referred him to Dr. Melony Overly for further workup and evaluation.  He otherwise denies any hematologic complaints.  Review of Systems  Constitutional: Negative.  Negative for chills, fever and weight loss.  HENT: Negative.   Eyes: Negative.   Respiratory: Negative.  Negative for cough.   Cardiovascular: Negative.  Negative for chest pain.  Gastrointestinal: Positive for diarrhea (1-2 loose stools per day x 3 months). Negative for blood in stool, constipation, melena, nausea and vomiting.  Genitourinary: Negative.  Negative for hematuria.  Musculoskeletal: Negative.   Skin: Negative.   Neurological: Negative.  Negative for weakness.  Endo/Heme/Allergies: Negative.   Psychiatric/Behavioral: Negative.     Past Medical History:  Diagnosis Date  . Aneurysm of thoracic aorta (HCC)    Ascending. 5.7 x 5.5cm - Bentall procedure  . Aortic stenosis    Bioprosthetic AVR  . Arthritis   . Atrial fibrillation ([place])    Documented on pacer interrogation 6/13  . Complete heart block ([place])    Status post pacemaker placement  . Coronary atherosclerosis of native coronary artery    Multivessel - LIMA to LAD, SVG to OM, SVG to PDA  . DJD (degenerative joint disease)   . Esophageal dysmotility   . Essential hypertension, benign   . Fibromyalgia   . Gout   . H/O hiatal hernia   . History of skin cancer   . Hx of adenomatous colonic polyps   . Iron deficiency anemia due to chronic blood loss 10/03/2016  . PMR (polymyalgia rheumatica) (HCC)   . Presence of permanent cardiac pacemaker     Past Surgical History:  Procedure Laterality Date  . AORTIC VALVE REPLACEMENT  01/24/12  . APPENDECTOMY    . [place] or 1966 lumbar back surgery  . BENTALL PROCEDURE  01/24/2012   Procedure: BENTALL PROCEDURE;  Surgeon: Ivin Poot, MD;  Location: [facility];  Service: Open Heart Surgery;  Laterality: N/A;  . BILATERAL KNEE ARTHROSCOPY    . CATARACT EXTRACTION W/PHACO Left 12/03/2016   Procedure: CATARACT EXTRACTION PHACO AND INTRAOCULAR LENS PLACEMENT LEFT EYE;  Surgeon: Tonny Branch, MD;  Location: [facility];  Service: Ophthalmology;  Laterality: Left;  CDE:  10.87  . CATARACT EXTRACTION W/PHACO Right 12/13/2016   Procedure: CATARACT EXTRACTION PHACO AND INTRAOCULAR LENS PLACEMENT (IOC);  Surgeon: Tonny Branch, MD;  Location: [facility];  Service: Ophthalmology;  Laterality: Right;  right cde 11.78  . CORONARY ARTERY BYPASS GRAFT  01/24/2012   Procedure: CORONARY ARTERY BYPASS GRAFTING (CABG);  Surgeon: Ivin Poot, MD;  Location: [facility];  Service: Open Heart Surgery;  Laterality: N/A;  . ESOPHAGOGASTRODUODENOSCOPY  06/17/2012   Procedure: ESOPHAGOGASTRODUODENOSCOPY (EGD);  Surgeon: Rogene Houston, MD;  [facility];  Service: Endoscopy;  Laterality: N/A;  ED  . ESOPHAGOGASTRODUODENOSCOPY N/A 05/19/2016   Procedure: ESOPHAGOGASTRODUODENOSCOPY (EGD);  Surgeon: Danie Binder, MD;  [facility];  Service: Endoscopy;  Laterality: N/A;  . HEMORROIDECTOMY    . HERNIA REPAIR    . Left inguinal hernia repair    . PACEMAKER PLACEMENT     [employer] 4/13 - Dr. Rayann Heman    . PERMANENT PACEMAKER INSERTION N/A 02/01/2012   Procedure: PERMANENT PACEMAKER INSERTION;  Surgeon: Thompson Grayer, MD; [place] [facility] (serial number [id number])      Family History  Problem Relation Age of Onset  . Kidney disease Mother   . Heart disease Father   . Anesthesia problems Neg Hx   . Hypotension Neg Hx   . Malignant hyperthermia Neg Hx   . Pseudochol deficiency Neg Hx     Social History   Social History  . Marital status: Married    Spouse name: N/A  . Number of children: 4  . Years of education: N/A   Occupational History  . Retired     Scientist, forensic   Social History Main Topics  . Smoking status: Never Smoker  . Smokeless tobacco: Never Used  . Alcohol use No  . Drug use: No  . Sexual activity: No   Other Topics Concern  . None   Social History Narrative   Lives with wife, has been married since 1954.     PHYSICAL EXAMINATION  ECOG PERFORMANCE STATUS: 1 - Symptomatic but completely ambulatory  Vitals:   01/17/17 1442  BP: 110/64  Pulse: 76  Resp: 18    GENERAL:alert, no distress, well nourished, well developed, comfortable, cooperative, smiling and unaccompanied SKIN: skin color, texture, turgor are normal, no rashes or significant lesions HEAD: Normocephalic, No masses, lesions, tenderness or abnormalities EYES: normal, EOMI, Conjunctiva are pink and non-injected EARS: External ears normal OROPHARYNX:lips, buccal mucosa, and tongue normal and mucous membranes are moist  NECK: supple, trachea midline LYMPH:  no palpable lymphadenopathy BREAST:not examined LUNGS: clear to auscultation  HEART: irregularly irregular and systolic murmur 1/6 heard best at LSB ABDOMEN:abdomen soft and normal bowel sounds BACK: Back symmetric, no curvature. EXTREMITIES:less then 2 second capillary refill, no joint deformities, effusion, or inflammation, no skin discoloration, no cyanosis  NEURO: alert & oriented x 3 with fluent speech, no focal  motor/sensory deficits, gait normal   LABORATORY DATA: CBC    Component Value Date/Time   WBC 6.5 01/17/2017 1249   RBC 4.29 01/17/2017 1249   HGB 13.6 01/17/2017 1249   HCT 41.4 01/17/2017 1249   PLT 156 01/17/2017 1249   MCV 96.5 01/17/2017 1249   MCH 31.7 01/17/2017 1249   MCHC 32.9 01/17/2017 1249   RDW 15.8 (H) 01/17/2017 1249   LYMPHSABS 1.2 01/17/2017 1249   MONOABS 0.4 01/17/2017 1249   EOSABS 0.4 01/17/2017 1249   BASOSABS 0.0 01/17/2017 1249      Chemistry      Component Value Date/Time   NA 140 01/17/2017 1249   K 3.5 01/17/2017 1249   CL 103 01/17/2017 1249   CO2 30 01/17/2017 1249   BUN 18 01/17/2017 1249   CREATININE 1.34 (H) 01/17/2017 1249   CREATININE 1.60 (H) 02/10/2013 1009      Component Value Date/Time   CALCIUM 8.6 (L) 01/17/2017 1249   ALKPHOS 87 10/03/2016 1557  AST 20 10/03/2016 1557   ALT 14 (L) 10/03/2016 1557   BILITOT 0.3 10/03/2016 1557     Lab Results  Component Value Date   IRON 78 12/27/2016   TIBC 307 12/27/2016   FERRITIN 215 12/27/2016     PENDING LABS:   RADIOGRAPHIC STUDIES:  No results found.   PATHOLOGY:    ASSESSMENT AND PLAN:  Iron deficiency anemia due to chronic blood loss Iron deficiency anemia, secondary to chronic GI blood loss with AVMs; further complicated by vitamin K antagonist anticoagulation and intolerance to PO iron therapy (ferrous sulfate).  Labs today: CBC diff, BMET, iron/TIBC, ferritin.  I personally reviewed and went over laboratory results with the patient.  The results are noted within this dictation.  Labs every 4 weeks: CBC diff, BMET, iron/TIBC, ferritin.    Return in 16 weeks for follow-up.   ORDERS PLACED FOR THIS ENCOUNTER: Orders Placed This Encounter  Procedures  . CBC with Differential  . Basic metabolic panel  . Iron and TIBC  . Ferritin    MEDICATIONS PRESCRIBED THIS ENCOUNTER: No orders of the defined types were placed in this encounter.   THERAPY PLAN:    Will monitor iron studies closely and replace iron when indicated.  All questions were answered. The patient knows to call the clinic with any problems, questions or concerns. We can certainly see the patient much sooner if necessary.  Patient and plan discussed with Dr. Twana First and she is in agreement with the aforementioned.   This note is electronically signed by: Doy Mince 01/17/2017 3:57 PM

## 2017-01-17 NOTE — Assessment & Plan Note (Signed)
Iron deficiency anemia, secondary to chronic GI blood loss with AVMs; further complicated by vitamin K antagonist anticoagulation and intolerance to PO iron therapy (ferrous sulfate).  Labs today: CBC diff, BMET, iron/TIBC, ferritin.  I personally reviewed and went over laboratory results with the patient.  The results are noted within this dictation.  Labs every 4 weeks: CBC diff, BMET, iron/TIBC, ferritin.    Return in 16 weeks for follow-up.

## 2017-01-17 NOTE — Patient Instructions (Signed)
[place] at [facility] Discharge Instructions  RECOMMENDATIONS MADE BY THE CONSULTANT AND ANY TEST RESULTS WILL BE SENT TO YOUR REFERRING PHYSICIAN.  You were seen today by Kirby Crigler PA-C. Return in 4 weeks for labs. Return in 4 months for follow up.   Thank you for choosing [place] at [facility] to provide your oncology and hematology care.  To afford each patient quality time with our provider, please arrive at least 15 minutes before your scheduled appointment time.    If you have a lab appointment with the [facility] please come in thru the  Main Entrance and check in at the main information desk  You need to re-schedule your appointment should you arrive 10 or more minutes late.  We strive to give you quality time with our providers, and arriving late affects you and other patients whose appointments are after yours.  Also, if you no show three or more times for appointments you may be dismissed from the clinic at the providers discretion.     Again, thank you for choosing [facility].  Our hope is that these requests will decrease the amount of time that you wait before being seen by our physicians.       _____________________________________________________________  Should you have questions after your visit to [facility], please contact our office at [phone] between the hours of 8:30 a.m. and 4:30 p.m.  Voicemails left after 4:30 p.m. will not be returned until the following business day.  For prescription refill requests, have your pharmacy contact our office.       Resources For Cancer Patients and their Caregivers ? American Cancer Society: Can assist with transportation, wigs, general needs, runs Look Good Feel Better.        [phone] ? Cancer Care: Provides financial assistance, online support groups, medication/co-pay assistance.  1-800-813-HOPE 270 494 3251) ? [facility] Assists [place] cancer patients and their families through emotional , educational and financial support.  563-883-9984 ? [place] DSS Where to apply for food stamps, Medicaid and utility assistance. 2493384975 ? RCATS: Transportation to medical appointments. 701 170 5183 ? Social Security Administration: May apply for disability if have a Stage IV cancer. 8670306339 743-849-4614 ? [employer], Disability and Transit Services: Assists with nutrition, care and transit needs. [place] Support Programs: @10RELATIVEDAYS @ > Cancer Support Group  2nd Tuesday of the month 1pm-2pm, Journey Room  > Creative Journey  3rd Tuesday of the month 1130am-1pm, Journey Room  > Look Good Feel Better  1st Wednesday of the month 10am-12 noon, Journey Room (Call [facility] to register [phone])

## 2017-01-21 ENCOUNTER — Other Ambulatory Visit (HOSPITAL_COMMUNITY): Payer: Self-pay | Admitting: Oncology

## 2017-01-23 ENCOUNTER — Ambulatory Visit (INDEPENDENT_AMBULATORY_CARE_PROVIDER_SITE_OTHER): Payer: Medicare Other | Admitting: *Deleted

## 2017-01-23 DIAGNOSIS — M109 Gout, unspecified: Secondary | ICD-10-CM | POA: Diagnosis not present

## 2017-01-23 DIAGNOSIS — Z952 Presence of prosthetic heart valve: Secondary | ICD-10-CM

## 2017-01-23 DIAGNOSIS — I4891 Unspecified atrial fibrillation: Secondary | ICD-10-CM

## 2017-01-23 DIAGNOSIS — I10 Essential (primary) hypertension: Secondary | ICD-10-CM | POA: Diagnosis not present

## 2017-01-23 DIAGNOSIS — D649 Anemia, unspecified: Secondary | ICD-10-CM | POA: Diagnosis not present

## 2017-01-23 DIAGNOSIS — Z79899 Other long term (current) drug therapy: Secondary | ICD-10-CM | POA: Diagnosis not present

## 2017-01-23 LAB — POCT INR: INR: 2

## 2017-01-23 MED ORDER — WARFARIN SODIUM 5 MG PO TABS: ORAL_TABLET | ORAL | 4 refills | Status: DC

## 2017-01-24 ENCOUNTER — Encounter (HOSPITAL_COMMUNITY): Payer: Medicare Other | Attending: Hematology & Oncology

## 2017-01-24 VITALS — BP 125/70 | HR 73 | Temp 98.1°F | Resp 16

## 2017-01-24 DIAGNOSIS — D500 Iron deficiency anemia secondary to blood loss (chronic): Secondary | ICD-10-CM | POA: Diagnosis present

## 2017-01-24 DIAGNOSIS — D649 Anemia, unspecified: Secondary | ICD-10-CM | POA: Insufficient documentation

## 2017-01-24 MED ORDER — SODIUM CHLORIDE 0.9 % IV SOLN
Freq: Once | INTRAVENOUS | Status: AC
  Administered 2017-01-24: 14:00:00 via INTRAVENOUS

## 2017-01-24 MED ORDER — SODIUM CHLORIDE 0.9 % IV SOLN
510.0000 mg | Freq: Once | INTRAVENOUS | Status: AC
  Administered 2017-01-24: 510 mg via INTRAVENOUS
  Filled 2017-01-24: qty 17

## 2017-01-24 NOTE — Progress Notes (Signed)
Feraheme given today per orders, patient tolerated it well. Vitals stable and discharged home from clinic ambulatory. Follow up as scheduled.

## 2017-01-24 NOTE — Patient Instructions (Signed)
[facility] at [facility] Discharge Instructions  RECOMMENDATIONS MADE BY THE CONSULTANT AND ANY TEST RESULTS WILL BE SENT TO YOUR REFERRING PHYSICIAN.  Feraheme given  Follow up as scheduled.  Thank you for choosing [facility] at [facility] to provide your oncology and hematology care.  To afford each patient quality time with our provider, please arrive at least 15 minutes before your scheduled appointment time.    If you have a lab appointment with the Cancer Center please come in thru the  Main Entrance and check in at the main information desk  You need to re-schedule your appointment should you arrive 10 or more minutes late.  We strive to give you quality time with our providers, and arriving late affects you and other patients whose appointments are after yours.  Also, if you no show three or more times for appointments you may be dismissed from the clinic at the providers discretion.     Again, thank you for choosing [facility].  Our hope is that these requests will decrease the amount of time that you wait before being seen by our physicians.       _____________________________________________________________  Should you have questions after your visit to [facility], please contact our office at [phone] between the hours of 8:30 a.m. and 4:30 p.m.  Voicemails left after 4:30 p.m. will not be returned until the following business day.  For prescription refill requests, have your pharmacy contact our office.       Resources For Cancer Patients and their Caregivers ? American Cancer Society: Can assist with transportation, wigs, general needs, runs Look Good Feel Better.        1-888-227-6333 ? Cancer Care: Provides financial assistance, online support groups, medication/co-pay assistance.  1-800-813-HOPE (4673) ? [facility] Assists [place] cancer patients and their  families through emotional , educational and financial support.  336-427-4357 ? [place] DSS Where to apply for food stamps, Medicaid and utility assistance. 336-342-1394 ? RCATS: Transportation to medical appointments. 336-347-2287 ? Social Security Administration: May apply for disability if have a Stage IV cancer. 336-342-7796 1-800-772-1213 ? [place] Aging, Disability and Transit Services: Assists with nutrition, care and transit needs. 336-349-2343  Cancer Center Support Programs: @10RELATIVEDAYS@ > Cancer Support Group  2nd Tuesday of the month 1pm-2pm, Journey Room  > Creative Journey  3rd Tuesday of the month 1130am-1pm, Journey Room  > Look Good Feel Better  1st Wednesday of the month 10am-12 noon, Journey Room (Call American Cancer Society to register 1-800-395-5775)   

## 2017-01-25 ENCOUNTER — Encounter (INDEPENDENT_AMBULATORY_CARE_PROVIDER_SITE_OTHER): Payer: Self-pay | Admitting: Internal Medicine

## 2017-01-31 DIAGNOSIS — N183 Chronic kidney disease, stage 3 (moderate): Secondary | ICD-10-CM | POA: Diagnosis not present

## 2017-01-31 DIAGNOSIS — I700 Atherosclerosis of aorta: Secondary | ICD-10-CM | POA: Diagnosis not present

## 2017-01-31 DIAGNOSIS — I480 Paroxysmal atrial fibrillation: Secondary | ICD-10-CM | POA: Diagnosis not present

## 2017-01-31 DIAGNOSIS — I10 Essential (primary) hypertension: Secondary | ICD-10-CM | POA: Diagnosis not present

## 2017-02-06 ENCOUNTER — Ambulatory Visit (INDEPENDENT_AMBULATORY_CARE_PROVIDER_SITE_OTHER): Payer: Medicare Other | Admitting: Internal Medicine

## 2017-02-13 DIAGNOSIS — D2271 Melanocytic nevi of right lower limb, including hip: Secondary | ICD-10-CM | POA: Diagnosis not present

## 2017-02-13 DIAGNOSIS — L821 Other seborrheic keratosis: Secondary | ICD-10-CM | POA: Diagnosis not present

## 2017-02-13 DIAGNOSIS — D225 Melanocytic nevi of trunk: Secondary | ICD-10-CM | POA: Diagnosis not present

## 2017-02-13 DIAGNOSIS — L570 Actinic keratosis: Secondary | ICD-10-CM | POA: Diagnosis not present

## 2017-02-13 DIAGNOSIS — Z85828 Personal history of other malignant neoplasm of skin: Secondary | ICD-10-CM | POA: Diagnosis not present

## 2017-02-13 DIAGNOSIS — D1801 Hemangioma of skin and subcutaneous tissue: Secondary | ICD-10-CM | POA: Diagnosis not present

## 2017-02-14 ENCOUNTER — Encounter (HOSPITAL_COMMUNITY): Payer: Medicare Other

## 2017-02-14 DIAGNOSIS — D649 Anemia, unspecified: Secondary | ICD-10-CM | POA: Diagnosis not present

## 2017-02-14 DIAGNOSIS — D500 Iron deficiency anemia secondary to blood loss (chronic): Secondary | ICD-10-CM

## 2017-02-14 LAB — CBC WITH DIFFERENTIAL/PLATELET
Basophils Absolute: 0.1 10*3/uL (ref 0.0–0.1)
Basophils Relative: 1 %
EOS ABS: 0.4 10*3/uL (ref 0.0–0.7)
EOS PCT: 6 %
HCT: 42.9 % (ref 39.0–52.0)
Hemoglobin: 14 g/dL (ref 13.0–17.0)
LYMPHS ABS: 1.3 10*3/uL (ref 0.7–4.0)
Lymphocytes Relative: 18 %
MCH: 32.5 pg (ref 26.0–34.0)
MCHC: 32.6 g/dL (ref 30.0–36.0)
MCV: 99.5 fL (ref 78.0–100.0)
MONO ABS: 0.5 10*3/uL (ref 0.1–1.0)
Monocytes Relative: 7 %
Neutro Abs: 5.2 10*3/uL (ref 1.7–7.7)
Neutrophils Relative %: 68 %
PLATELETS: 188 10*3/uL (ref 150–400)
RBC: 4.31 MIL/uL (ref 4.22–5.81)
RDW: 14.6 % (ref 11.5–15.5)
WBC: 7.6 10*3/uL (ref 4.0–10.5)

## 2017-02-14 LAB — BASIC METABOLIC PANEL
Anion gap: 8 (ref 5–15)
BUN: 21 mg/dL — AB (ref 6–20)
CALCIUM: 9.2 mg/dL (ref 8.9–10.3)
CO2: 29 mmol/L (ref 22–32)
CREATININE: 1.26 mg/dL — AB (ref 0.61–1.24)
Chloride: 102 mmol/L (ref 101–111)
GFR calc Af Amer: 57 mL/min — ABNORMAL LOW (ref >60)
GFR, EST NON AFRICAN AMERICAN: 49 mL/min — AB (ref >60)
Glucose, Bld: 122 mg/dL — ABNORMAL HIGH (ref 65–99)
POTASSIUM: 4.1 mmol/L (ref 3.5–5.1)
SODIUM: 139 mmol/L (ref 135–145)

## 2017-02-14 LAB — FERRITIN: Ferritin: 192 ng/mL (ref 24–336)

## 2017-02-14 LAB — IRON AND TIBC
IRON: 79 ug/dL (ref 45–182)
Saturation Ratios: 28 % (ref 17.9–39.5)
TIBC: 280 ug/dL (ref 250–450)
UIBC: 201 ug/dL

## 2017-03-06 ENCOUNTER — Ambulatory Visit (INDEPENDENT_AMBULATORY_CARE_PROVIDER_SITE_OTHER): Payer: Medicare Other | Admitting: *Deleted

## 2017-03-06 DIAGNOSIS — I4891 Unspecified atrial fibrillation: Secondary | ICD-10-CM

## 2017-03-06 DIAGNOSIS — Z952 Presence of prosthetic heart valve: Secondary | ICD-10-CM | POA: Diagnosis not present

## 2017-03-06 LAB — POCT INR: INR: 1.9

## 2017-03-14 ENCOUNTER — Encounter (HOSPITAL_COMMUNITY): Payer: Medicare Other | Attending: Oncology

## 2017-03-14 DIAGNOSIS — D500 Iron deficiency anemia secondary to blood loss (chronic): Secondary | ICD-10-CM | POA: Diagnosis not present

## 2017-03-14 LAB — BASIC METABOLIC PANEL
Anion gap: 9 (ref 5–15)
BUN: 26 mg/dL — AB (ref 6–20)
CALCIUM: 9 mg/dL (ref 8.9–10.3)
CO2: 29 mmol/L (ref 22–32)
CREATININE: 1.33 mg/dL — AB (ref 0.61–1.24)
Chloride: 101 mmol/L (ref 101–111)
GFR calc Af Amer: 54 mL/min — ABNORMAL LOW (ref >60)
GFR, EST NON AFRICAN AMERICAN: 46 mL/min — AB (ref >60)
GLUCOSE: 98 mg/dL (ref 65–99)
Potassium: 4.1 mmol/L (ref 3.5–5.1)
Sodium: 139 mmol/L (ref 135–145)

## 2017-03-14 LAB — CBC WITH DIFFERENTIAL/PLATELET
BASOS ABS: 0 10*3/uL (ref 0.0–0.1)
Basophils Relative: 0 %
EOS PCT: 7 %
Eosinophils Absolute: 0.5 10*3/uL (ref 0.0–0.7)
HCT: 42.2 % (ref 39.0–52.0)
Hemoglobin: 14 g/dL (ref 13.0–17.0)
Lymphocytes Relative: 19 %
Lymphs Abs: 1.4 10*3/uL (ref 0.7–4.0)
MCH: 32.9 pg (ref 26.0–34.0)
MCHC: 33.2 g/dL (ref 30.0–36.0)
MCV: 99.1 fL (ref 78.0–100.0)
MONO ABS: 0.7 10*3/uL (ref 0.1–1.0)
Monocytes Relative: 10 %
Neutro Abs: 4.7 10*3/uL (ref 1.7–7.7)
Neutrophils Relative %: 64 %
PLATELETS: 168 10*3/uL (ref 150–400)
RBC: 4.26 MIL/uL (ref 4.22–5.81)
RDW: 14.5 % (ref 11.5–15.5)
WBC: 7.4 10*3/uL (ref 4.0–10.5)

## 2017-03-14 LAB — IRON AND TIBC
Iron: 68 ug/dL (ref 45–182)
Saturation Ratios: 25 % (ref 17.9–39.5)
TIBC: 272 ug/dL (ref 250–450)
UIBC: 204 ug/dL

## 2017-03-14 LAB — FERRITIN: Ferritin: 198 ng/mL (ref 24–336)

## 2017-04-03 ENCOUNTER — Ambulatory Visit (INDEPENDENT_AMBULATORY_CARE_PROVIDER_SITE_OTHER): Payer: Medicare Other | Admitting: *Deleted

## 2017-04-03 DIAGNOSIS — I4891 Unspecified atrial fibrillation: Secondary | ICD-10-CM

## 2017-04-03 DIAGNOSIS — Z952 Presence of prosthetic heart valve: Secondary | ICD-10-CM

## 2017-04-03 LAB — POCT INR: INR: 2.3

## 2017-04-11 ENCOUNTER — Ambulatory Visit (INDEPENDENT_AMBULATORY_CARE_PROVIDER_SITE_OTHER): Payer: Medicare Other | Admitting: Internal Medicine

## 2017-04-11 ENCOUNTER — Encounter (HOSPITAL_COMMUNITY): Payer: Medicare Other | Attending: Oncology

## 2017-04-11 ENCOUNTER — Encounter: Payer: Self-pay | Admitting: Internal Medicine

## 2017-04-11 VITALS — BP 118/68 | HR 78 | Ht 70.0 in | Wt 205.0 lb

## 2017-04-11 DIAGNOSIS — Z45018 Encounter for adjustment and management of other part of cardiac pacemaker: Secondary | ICD-10-CM | POA: Diagnosis not present

## 2017-04-11 DIAGNOSIS — I442 Atrioventricular block, complete: Secondary | ICD-10-CM

## 2017-04-11 DIAGNOSIS — I480 Paroxysmal atrial fibrillation: Secondary | ICD-10-CM | POA: Diagnosis not present

## 2017-04-11 DIAGNOSIS — D500 Iron deficiency anemia secondary to blood loss (chronic): Secondary | ICD-10-CM | POA: Insufficient documentation

## 2017-04-11 LAB — BASIC METABOLIC PANEL
ANION GAP: 7 (ref 5–15)
BUN: 26 mg/dL — ABNORMAL HIGH (ref 6–20)
CALCIUM: 8.8 mg/dL — AB (ref 8.9–10.3)
CO2: 28 mmol/L (ref 22–32)
CREATININE: 1.32 mg/dL — AB (ref 0.61–1.24)
Chloride: 104 mmol/L (ref 101–111)
GFR, EST AFRICAN AMERICAN: 54 mL/min — AB (ref >60)
GFR, EST NON AFRICAN AMERICAN: 47 mL/min — AB (ref >60)
GLUCOSE: 111 mg/dL — AB (ref 65–99)
Potassium: 4.1 mmol/L (ref 3.5–5.1)
Sodium: 139 mmol/L (ref 135–145)

## 2017-04-11 LAB — CBC WITH DIFFERENTIAL/PLATELET
BASOS ABS: 0 10*3/uL (ref 0.0–0.1)
BASOS PCT: 1 %
EOS PCT: 6 %
Eosinophils Absolute: 0.5 10*3/uL (ref 0.0–0.7)
HCT: 41.1 % (ref 39.0–52.0)
Hemoglobin: 13.6 g/dL (ref 13.0–17.0)
Lymphocytes Relative: 19 %
Lymphs Abs: 1.5 10*3/uL (ref 0.7–4.0)
MCH: 32.6 pg (ref 26.0–34.0)
MCHC: 33.1 g/dL (ref 30.0–36.0)
MCV: 98.6 fL (ref 78.0–100.0)
MONO ABS: 0.7 10*3/uL (ref 0.1–1.0)
Monocytes Relative: 8 %
Neutro Abs: 5.2 10*3/uL (ref 1.7–7.7)
Neutrophils Relative %: 66 %
PLATELETS: 148 10*3/uL — AB (ref 150–400)
RBC: 4.17 MIL/uL — ABNORMAL LOW (ref 4.22–5.81)
RDW: 14.2 % (ref 11.5–15.5)
WBC: 7.9 10*3/uL (ref 4.0–10.5)

## 2017-04-11 LAB — FERRITIN: Ferritin: 157 ng/mL (ref 24–336)

## 2017-04-11 LAB — IRON AND TIBC
Iron: 73 ug/dL (ref 45–182)
SATURATION RATIOS: 26 % (ref 17.9–39.5)
TIBC: 280 ug/dL (ref 250–450)
UIBC: 207 ug/dL

## 2017-04-11 NOTE — Patient Instructions (Signed)
Medication Instructions:    Your physician recommends that you continue on your current medications as directed. Please refer to the Current Medication list given to you today.  --- If you need a refill on your cardiac medications before your next appointment, please call your pharmacy. ---  Labwork:  None ordered  Testing/Procedures:  None ordered  Follow-Up: Remote monitoring is used to monitor your Pacemaker of ICD from home. This monitoring reduces the number of office visits required to check your device to one time per year. It allows [place] to keep an eye on the functioning of your device to ensure it is working properly. You are scheduled for a device check from home on 07/11/2017. You may send your transmission at any time that day. If you have a wireless device, the transmission will be sent automatically. After your physician reviews your transmission, you will receive a postcard with your next transmission date.   Your physician wants you to follow-up in: 1 year with Dr. Lovena Le.  You will receive a reminder letter in the mail two months in advance. If you don't receive a letter, please call our office to schedule the follow-up appointment.  Thank you for choosing [facility]!!

## 2017-04-11 NOTE — Progress Notes (Signed)
HPI [name] returns today for followup. He is a pleasant 83 yo man with a h/o CHB, after AVR/Bentall with a tissue valve, s/p PPM, HTN and chronic coumadin therapy. In the interim, he has done well mostly. He noted a bleeding ulcer back in July which has been stable. No chest pain or sob. Minimal edema.  Allergies  Allergen Reactions  . Imdur [Isosorbide Nitrate] Other (See Comments)    Nausea and lightheaded      Current Outpatient Prescriptions  Medication Sig Dispense Refill  . acetaminophen (TYLENOL) 500 MG tablet Take 1,000 mg by mouth every 6 (six) hours as needed for mild pain.    Marland Kitchen allopurinol (ZYLOPRIM) 300 MG tablet Take 450 mg by mouth daily. Take 1 1/2 daily    . furosemide (LASIX) 40 MG tablet Take 1 tablet (40 mg total) by mouth daily. 45 tablet 6  . metoprolol succinate (TOPROL-XL) 25 MG 24 hr tablet TAKE (2) TABLETS BY MOUTH DAILY. 60 tablet 6  . pantoprazole (PROTONIX) 40 MG tablet TAKE ONE TABLET BY MOUTH ONCE DAILY IN THE MORNING. 30 tablet 6  . pravastatin (PRAVACHOL) 20 MG tablet Take 20 mg by mouth every evening.   11  . warfarin (COUMADIN) 5 MG tablet Take 1 tablet daily except 1/2 tablet on Sundays 90 tablet 4   No current facility-administered medications for this visit.      Past Medical History:  Diagnosis Date  . Aneurysm of thoracic aorta (HCC)    Ascending. 5.7 x 5.5cm - Bentall procedure  . Aortic stenosis    Bioprosthetic AVR  . Arthritis   . Atrial fibrillation (HCC)    Documented on pacer interrogation 6/13  . Complete heart block (HCC)    Status post pacemaker placement  . Coronary atherosclerosis of native coronary artery    Multivessel - LIMA to LAD, SVG to OM, SVG to PDA  . DJD (degenerative joint disease)   . Esophageal dysmotility   . Essential hypertension, benign   . Fibromyalgia   . Gout   . H/O hiatal hernia   . History of skin cancer   . Hx of adenomatous colonic polyps   . Iron deficiency anemia due to chronic  blood loss 10/03/2016  . PMR (polymyalgia rheumatica) (HCC)   . Presence of permanent cardiac pacemaker     ROS:   All systems reviewed and negative except as noted in the HPI.   Past Surgical History:  Procedure Laterality Date  . AORTIC VALVE REPLACEMENT  01/24/12  . APPENDECTOMY    . BACK SURGERY     [age] or 1966 lumbar back surgery  . BENTALL PROCEDURE  01/24/2012   Procedure: BENTALL PROCEDURE;  Surgeon: Douglas Poot, MD;  Location: [facility];  Service: Open Heart Surgery;  Laterality: N/A;  . BILATERAL KNEE ARTHROSCOPY    . CATARACT EXTRACTION W/PHACO Left 12/03/2016   Procedure: CATARACT EXTRACTION PHACO AND INTRAOCULAR LENS PLACEMENT LEFT EYE;  Surgeon: Douglas Branch, MD;  [facility];  Service: Ophthalmology;  Laterality: Left;  CDE:  10.87  . CATARACT EXTRACTION W/PHACO Right 12/13/2016   Procedure: CATARACT EXTRACTION PHACO AND INTRAOCULAR LENS PLACEMENT (IOC);  Surgeon: Douglas Branch, MD;  [facility];  Service: Ophthalmology;  Laterality: Right;  right cde 11.78  . CORONARY ARTERY BYPASS GRAFT  01/24/2012   Procedure: CORONARY ARTERY BYPASS GRAFTING (CABG);  Surgeon: Douglas Poot, MD;  Location: [facility];  Service: Open Heart Surgery;  Laterality: N/A;  .  ESOPHAGOGASTRODUODENOSCOPY  06/17/2012   Procedure: ESOPHAGOGASTRODUODENOSCOPY (EGD);  Surgeon: Douglas Houston, MD;  [facility];  Service: Endoscopy;  Laterality: N/A;  ED  . ESOPHAGOGASTRODUODENOSCOPY N/A 05/19/2016   Procedure: ESOPHAGOGASTRODUODENOSCOPY (EGD);  Surgeon: Douglas Binder, MD;  [facility];  Service: Endoscopy;  Laterality: N/A;  . HEMORROIDECTOMY    . HERNIA REPAIR    . Left inguinal hernia repair    . PACEMAKER PLACEMENT     Douglas [name] 4/13 - Dr. Rayann [name]  . PERMANENT PACEMAKER INSERTION N/A 02/01/2012   Procedure: PERMANENT PACEMAKER INSERTION;  Surgeon: Douglas Grayer, MD; [place] 1 (serial number [id number])       Family History  Problem Relation Age of  Onset  . Kidney disease Mother   . Heart disease Father   . Anesthesia problems Neg Hx   . Hypotension Neg Hx   . Malignant hyperthermia Neg Hx   . Pseudochol deficiency Neg Hx      Social History   Social History  . Marital status: Married    Spouse name: N/A  . Number of children: 4  . Years of education: N/A   Occupational History  . Retired     Scientist, forensic   Social History Main Topics  . Smoking status: Never Smoker  . Smokeless tobacco: Never Used  . Alcohol use No  . Drug use: No  . Sexual activity: No   Other Topics Concern  . Not on file   Social History Narrative   Lives with wife, has been married since 62.     BP 118/68   Pulse 78   Ht 5\' 10"  (1.778 m)   Wt 205 lb (93 kg)   SpO2 95%   BMI 29.41 kg/m   Physical Exam:  Well appearing 83 yo man, NAD HEENT: Unremarkable Neck:  6 cm JVD, no thyromegally Lymphatics:  No adenopathy Back:  No CVA tenderness Lungs:  Clear with no wheezes.  HEART:  Regular rate rhythm, no murmurs, no rubs, no clicks, with no murmur. Abd:  soft, positive bowel sounds, no organomegally, no rebound, no guarding Ext:  2 plus pulses, no edema, no cyanosis, no clubbing Skin:  No rashes no nodules Neuro:  CN II through XII intact, motor grossly intact   DEVICE  Normal device function.  See PaceArt for details.   Assess/Plan: 1. Chronic diastolic heart failure - he is doing well. Symptoms are really class1. No change in meds. He is encouraged to maintain a low sodium diet. 2. HTN -his blood pressure is well controlled. No change in meds. 3. PPM - his Douglas [name] DDD PM is working normally. Will recheck in several months. 4. PAF - device interogation demonstrates that he has had a few minutes of atrial fib. He will remain on coumadin.  Douglas [name].D.

## 2017-04-30 LAB — CUP PACEART INCLINIC DEVICE CHECK
Brady Statistic AP VS Percent: 0.1 % — CL
Brady Statistic AS VP Percent: 32.3 %
Implantable Lead Implant Date: 20130412
Implantable Lead Location: 753860
Implantable Lead Model: 5076
Lead Channel Impedance Value: 559 Ohm
Lead Channel Pacing Threshold Amplitude: 0.5 V
Lead Channel Pacing Threshold Amplitude: 0.5 V
Lead Channel Sensing Intrinsic Amplitude: 1.4 mV
MDC IDC LEAD IMPLANT DT: 20130412
MDC IDC LEAD LOCATION: 753859
MDC IDC MSMT BATTERY IMPEDANCE: 428 Ohm
MDC IDC MSMT BATTERY REMAINING LONGEVITY: 90 mo
MDC IDC MSMT BATTERY VOLTAGE: 2.77 V
MDC IDC MSMT LEADCHNL RA IMPEDANCE VALUE: 460 Ohm
MDC IDC MSMT LEADCHNL RA PACING THRESHOLD PULSEWIDTH: 0.4 ms
MDC IDC MSMT LEADCHNL RV PACING THRESHOLD PULSEWIDTH: 0.4 ms
MDC IDC PG IMPLANT DT: 20130412
MDC IDC SESS DTM: 20180710120047
MDC IDC SET LEADCHNL RA PACING AMPLITUDE: 2 V
MDC IDC SET LEADCHNL RV PACING AMPLITUDE: 2 V
MDC IDC SET LEADCHNL RV PACING PULSEWIDTH: 0.4 ms
MDC IDC SET LEADCHNL RV SENSING SENSITIVITY: 4 mV
MDC IDC STAT BRADY AP VP PERCENT: 67.6 %
MDC IDC STAT BRADY AS VS PERCENT: 0.1 % — AB

## 2017-05-01 ENCOUNTER — Ambulatory Visit (INDEPENDENT_AMBULATORY_CARE_PROVIDER_SITE_OTHER): Payer: Medicare Other | Admitting: *Deleted

## 2017-05-01 DIAGNOSIS — I4891 Unspecified atrial fibrillation: Secondary | ICD-10-CM

## 2017-05-01 DIAGNOSIS — Z952 Presence of prosthetic heart valve: Secondary | ICD-10-CM

## 2017-05-01 LAB — POCT INR: INR: 2.9

## 2017-05-09 ENCOUNTER — Encounter (HOSPITAL_COMMUNITY): Payer: Self-pay | Admitting: Adult Health

## 2017-05-09 ENCOUNTER — Encounter (HOSPITAL_COMMUNITY): Payer: Medicare Other | Attending: Adult Health | Admitting: Adult Health

## 2017-05-09 ENCOUNTER — Encounter (HOSPITAL_COMMUNITY): Payer: Medicare Other

## 2017-05-09 VITALS — BP 125/61 | HR 77 | Temp 98.2°F | Resp 16 | Ht 67.0 in | Wt 208.5 lb

## 2017-05-09 DIAGNOSIS — K5900 Constipation, unspecified: Secondary | ICD-10-CM | POA: Diagnosis not present

## 2017-05-09 DIAGNOSIS — Q2733 Arteriovenous malformation of digestive system vessel: Secondary | ICD-10-CM | POA: Diagnosis not present

## 2017-05-09 DIAGNOSIS — D500 Iron deficiency anemia secondary to blood loss (chronic): Secondary | ICD-10-CM | POA: Insufficient documentation

## 2017-05-09 LAB — CBC WITH DIFFERENTIAL/PLATELET
BASOS ABS: 0 10*3/uL (ref 0.0–0.1)
Basophils Relative: 0 %
Eosinophils Absolute: 0.4 10*3/uL (ref 0.0–0.7)
Eosinophils Relative: 6 %
HEMATOCRIT: 41.3 % (ref 39.0–52.0)
Hemoglobin: 13.6 g/dL (ref 13.0–17.0)
LYMPHS PCT: 15 %
Lymphs Abs: 1.1 10*3/uL (ref 0.7–4.0)
MCH: 32.9 pg (ref 26.0–34.0)
MCHC: 32.9 g/dL (ref 30.0–36.0)
MCV: 99.8 fL (ref 78.0–100.0)
MONO ABS: 0.6 10*3/uL (ref 0.1–1.0)
Monocytes Relative: 8 %
NEUTROS ABS: 5.5 10*3/uL (ref 1.7–7.7)
Neutrophils Relative %: 71 %
Platelets: 158 10*3/uL (ref 150–400)
RBC: 4.14 MIL/uL — AB (ref 4.22–5.81)
RDW: 14 % (ref 11.5–15.5)
WBC: 7.7 10*3/uL (ref 4.0–10.5)

## 2017-05-09 LAB — BASIC METABOLIC PANEL
ANION GAP: 9 (ref 5–15)
BUN: 23 mg/dL — ABNORMAL HIGH (ref 6–20)
CHLORIDE: 100 mmol/L — AB (ref 101–111)
CO2: 29 mmol/L (ref 22–32)
Calcium: 9.2 mg/dL (ref 8.9–10.3)
Creatinine, Ser: 1.1 mg/dL (ref 0.61–1.24)
GFR calc Af Amer: >60 mL/min (ref >60)
GFR calc non Af Amer: 58 mL/min — ABNORMAL LOW (ref >60)
GLUCOSE: 115 mg/dL — AB (ref 65–99)
POTASSIUM: 4.1 mmol/L (ref 3.5–5.1)
Sodium: 138 mmol/L (ref 135–145)

## 2017-05-09 LAB — IRON AND TIBC
Iron: 47 ug/dL (ref 45–182)
SATURATION RATIOS: 16 % — AB (ref 17.9–39.5)
TIBC: 286 ug/dL (ref 250–450)
UIBC: 239 ug/dL

## 2017-05-09 LAB — FERRITIN: Ferritin: 138 ng/mL (ref 24–336)

## 2017-05-09 NOTE — Progress Notes (Signed)
[place] [place], [place] [address]   CLINIC:  Medical Oncology/Hematology  PCP:  [name], MD [address] [place] [address] [phone]   REASON FOR VISIT:  Follow-up for Iron deficiency anemia d/t chronic GI blood loss with AVMs  CURRENT THERAPY: IV iron prn    HISTORY OF PRESENT ILLNESS:  (From Kirby Crigler, PA-C's last note on 01/17/17)     INTERVAL HISTORY:  Douglas [name] 83 y.o. male returns for follow-up for iron deficiency anemia.   Overall, he tells me he has been feeling quite well.  Appetite is 100%; energy levels are 75%. Endorses occasional fatigue, "but I think anybody that is 83 years old is going to feel tired sometimes."    Denies any frank bleeding episodes including blood in his stools, dark/tarry stools, or hematuria. Endorses occasional constipation recently. He was struggling with diarrhea "and now my bowels have gone the other way." He takes a laxative occasionally. States that the shape of his stools are thin. Shares with me that he had hemorrhoid surgery many years ago and "he sewed me up too tight."   Denies pica or pagophagia.  Overall, he is largely without complaints today.   He remains very active in his garden; currently has been tending to corn, potatoes, tomatoes, cantaloupe, & green beans. He has been a farmer "all my life."  He has bilateral knee pain, which has been chronic and has required steroid injections intermittently.  He and his wife will celebrate their 21th wedding anniversary this year. They had 4 children and have several grandchildren and great-grandchildren as well.     REVIEW OF SYSTEMS:  Review of Systems  Constitutional: Positive for fatigue. Negative for chills and fever.  HENT:  Negative.  Negative for lump/mass and nosebleeds.   Eyes: Negative.   Respiratory: Negative.  Negative for cough and shortness of breath.   Cardiovascular: Negative.  Negative for chest pain and leg  swelling.  Gastrointestinal: Positive for constipation. Negative for abdominal pain, blood in stool, diarrhea, nausea and vomiting.  Endocrine: Negative.   Genitourinary: Negative.  Negative for dysuria and hematuria.   Musculoskeletal: Positive for arthralgias (knee pain ).  Skin: Negative.  Negative for rash.  Neurological: Negative.  Negative for dizziness and headaches.  Hematological: Negative.  Negative for adenopathy. Does not bruise/bleed easily.  Psychiatric/Behavioral: Negative.  Negative for depression and sleep disturbance. The patient is not nervous/anxious.      PAST MEDICAL/SURGICAL HISTORY:  Past Medical History:  Diagnosis Date  . Aneurysm of thoracic aorta (HCC)    Ascending. 5.7 x 5.5cm - Bentall procedure  . Aortic stenosis    Bioprosthetic AVR  . Arthritis   . Atrial fibrillation ([facility])    Documented on pacer interrogation 6/13  . Complete heart block ([facility])    Status post pacemaker placement  . Coronary atherosclerosis of native coronary artery    Multivessel - LIMA to LAD, SVG to OM, SVG to PDA  . DJD (degenerative joint disease)   . Esophageal dysmotility   . Essential hypertension, benign   . Fibromyalgia   . Gout   . H/O hiatal hernia   . History of skin cancer   . Hx of adenomatous colonic polyps   . Iron deficiency anemia due to chronic blood loss 10/03/2016  . PMR (polymyalgia rheumatica) (HCC)   . Presence of permanent cardiac pacemaker    Past Surgical History:  Procedure Laterality Date  . AORTIC VALVE REPLACEMENT  01/24/12  . APPENDECTOMY    . [place] or 1966 lumbar back surgery  . BENTALL PROCEDURE  01/24/2012   Procedure: BENTALL PROCEDURE;  Surgeon: Ivin Poot, MD;  Location: [facility];  Service: Open Heart Surgery;  Laterality: N/A;  . BILATERAL KNEE ARTHROSCOPY    . CATARACT EXTRACTION W/PHACO Left 12/03/2016   Procedure: CATARACT EXTRACTION PHACO AND INTRAOCULAR LENS PLACEMENT LEFT EYE;  Surgeon: Tonny Branch, MD;  [facility];  Service: Ophthalmology;  Laterality: Left;  CDE:  10.87  . CATARACT EXTRACTION W/PHACO Right 12/13/2016   Procedure: CATARACT EXTRACTION PHACO AND INTRAOCULAR LENS PLACEMENT (IOC);  Surgeon: Tonny Branch, MD;  [facility];  Service: Ophthalmology;  Laterality: Right;  right cde 11.78  . CORONARY ARTERY BYPASS GRAFT  01/24/2012   Procedure: CORONARY ARTERY BYPASS GRAFTING (CABG);  Surgeon: Ivin Poot, MD;  Location: [facility];  Service: Open Heart Surgery;  Laterality: N/A;  . ESOPHAGOGASTRODUODENOSCOPY  06/17/2012   Procedure: ESOPHAGOGASTRODUODENOSCOPY (EGD);  Surgeon: Rogene Houston, MD;  [facility];  Service: Endoscopy;  Laterality: N/A;  ED  . ESOPHAGOGASTRODUODENOSCOPY N/A 05/19/2016   Procedure: ESOPHAGOGASTRODUODENOSCOPY (EGD);  Surgeon: Danie Binder, MD;  [facility];  Service: Endoscopy;  Laterality: N/A;  . HEMORROIDECTOMY    . HERNIA REPAIR    . Left inguinal hernia repair    . PACEMAKER PLACEMENT     [employer] 4/13 - Dr. Rayann Heman  . PERMANENT PACEMAKER INSERTION N/A 02/01/2012   Procedure: PERMANENT PACEMAKER INSERTION;  Surgeon: Thompson Grayer, MD; [place] 1 (serial number [id number])       SOCIAL HISTORY:  Social History   Social History  . Marital status: Married    Spouse name: N/A  . Number of children: 4  . Years of education: N/A   Occupational History  . Retired     Scientist, forensic   Social History Main Topics  . Smoking status: Never Smoker  . Smokeless tobacco: Never Used  . Alcohol use No  . Drug use: No  . Sexual activity: No   Other Topics Concern  . Not on file   Social History Narrative   Lives with wife, has been married since 52.    FAMILY HISTORY:  Family History  Problem Relation Age of Onset  . Kidney disease Mother   . Heart disease Father   . Anesthesia problems Neg Hx   . Hypotension Neg Hx   . Malignant hyperthermia Neg Hx   . Pseudochol deficiency Neg Hx     CURRENT  MEDICATIONS:  Outpatient Encounter Prescriptions as of 05/09/2017  Medication Sig  . acetaminophen (TYLENOL) 500 MG tablet Take 1,000 mg by mouth every 6 (six) hours as needed for mild pain.  Marland Kitchen allopurinol (ZYLOPRIM) 300 MG tablet Take 450 mg by mouth daily. Take 1 1/2 daily  . amoxicillin (AMOXIL) 500 MG capsule   . furosemide (LASIX) 40 MG tablet Take 1 tablet (40 mg total) by mouth daily.  . metoprolol succinate (TOPROL-XL) 25 MG 24 hr tablet TAKE (2) TABLETS BY MOUTH DAILY.  . pantoprazole (PROTONIX) 40 MG tablet TAKE ONE TABLET BY MOUTH ONCE DAILY IN THE MORNING.  . pravastatin (PRAVACHOL) 20 MG tablet Take 20 mg by mouth every evening.   . warfarin (COUMADIN) 5 MG tablet Take 1 tablet daily except 1/2 tablet on Sundays   No facility-administered encounter medications on file as of 05/09/2017.  ALLERGIES:  Allergies  Allergen Reactions  . Imdur [Isosorbide Nitrate] Other (See Comments)    Nausea and lightheaded      PHYSICAL EXAM:  ECOG Performance status: 0 - Asymptomatic and independent   Vitals:   05/09/17 1335  BP: 125/61  Pulse: 77  Resp: 16  Temp: 98.2 F (36.8 C)   Filed Weights   05/09/17 1335  Weight: 208 lb 8 oz (94.6 kg)    Physical Exam  Constitutional: He is oriented to person, place, and time and well-developed, well-nourished, and in no distress.  HENT:  Head: Normocephalic.  Mouth/Throat: Oropharynx is clear and moist. No oropharyngeal exudate.  Eyes: Pupils are equal, round, and reactive to light. Conjunctivae are normal. No scleral icterus.  Neck: Normal range of motion. Neck supple.  Cardiovascular: Normal rate and regular rhythm.   Systolic click  Pulmonary/Chest: Effort normal and breath sounds normal. No respiratory distress.  Abdominal: Soft. Bowel sounds are normal. There is no tenderness.  Musculoskeletal: Normal range of motion. He exhibits edema (1+ BLE/ankle edema ).  Lymphadenopathy:    He has no cervical adenopathy.       Right:  No supraclavicular adenopathy present.       Left: No supraclavicular adenopathy present.  Neurological: He is alert and oriented to person, place, and time. No cranial nerve deficit. Gait normal.  Skin: Skin is warm and dry. No rash noted.  Psychiatric: Mood, memory, affect and judgment normal.  Nursing note and vitals reviewed.    LABORATORY DATA:  I have reviewed the labs as listed.  CBC    Component Value Date/Time   WBC 7.7 05/09/2017 1304   RBC 4.14 (L) 05/09/2017 1304   HGB 13.6 05/09/2017 1304   HCT 41.3 05/09/2017 1304   PLT 158 05/09/2017 1304   MCV 99.8 05/09/2017 1304   MCH 32.9 05/09/2017 1304   MCHC 32.9 05/09/2017 1304   RDW 14.0 05/09/2017 1304   LYMPHSABS 1.1 05/09/2017 1304   MONOABS 0.6 05/09/2017 1304   EOSABS 0.4 05/09/2017 1304   BASOSABS 0.0 05/09/2017 1304   CMP Latest Ref Rng & Units 05/09/2017 04/11/2017 03/14/2017  Glucose 65 - 99 mg/dL 115(H) 111(H) 98  BUN 6 - 20 mg/dL 23(H) 26(H) 26(H)  Creatinine 0.61 - 1.24 mg/dL 1.10 1.32(H) 1.33(H)  Sodium 135 - 145 mmol/L 138 139 139  Potassium 3.5 - 5.1 mmol/L 4.1 4.1 4.1  Chloride 101 - 111 mmol/L 100(L) 104 101  CO2 22 - 32 mmol/L 29 28 29   Calcium 8.9 - 10.3 mg/dL 9.2 8.8(L) 9.0  Total Protein 6.5 - 8.1 g/dL - - -  Total Bilirubin 0.3 - 1.2 mg/dL - - -  Alkaline Phos 38 - 126 U/L - - -  AST 15 - 41 U/L - - -  ALT 17 - 63 U/L - - -    PENDING LABS:    DIAGNOSTIC IMAGING:  EGD: 05/19/16     PATHOLOGY:       ASSESSMENT & PLAN:   Iron deficiency anemia:  -Thought to be secondary to chronic GI blood loss with AVMs. Initially treated with po iron, but unable to tolerate d/t constipation. Has been getting IV iron when needed.  -Clinically he is doing well with no reported blood in his stools, dark/tarry stools, or hematuria. No pica or pagophagia.  -Last IV iron infusion was 01/24/17; prior to that 12/12/16.  Oncology Flowsheet 12/12/2016 01/24/2017  ferumoxytol (FERAHEME) IV 510 mg 510 mg    -Iron studies are  pending for today; we will contact him with results when they are available and make arrangements for IV iron if needed.  -Will obtain serial lab monitoring every 6-8 weeks.  -Return to cancer center in 4 months for continued follow-up with labs.   Mild constipation:  -Encouraged OTC Miralax daily with stool softeners. Not likely to be d/t intermittent IV iron. -Recommended he follow-up with his PCP or other specialists if bowel habits do not improve with OTC interventions. Encouraged him to report any additional changes in his bowels that persist (like the thin shaped stools) to his PCP as well.        Dispo:  -Labs only every 6-8 weeks -Return to cancer center in 4 months for follow-up with labs.    All questions were answered to patient's stated satisfaction. Encouraged patient to call with any new concerns or questions before his next visit to the cancer center and we can certain see him sooner, if needed.    Plan of care discussed with Dr. Talbert Cage, who agrees with the above aforementioned.    Orders placed this encounter:  Orders Placed This Encounter  Procedures  . CBC with Differential/Platelet  . Basic metabolic panel  . Ferritin  . Iron and TIBC      Mike Craze, NP [place] [phone]

## 2017-05-09 NOTE — Patient Instructions (Addendum)
[place] at [facility] Discharge Instructions  RECOMMENDATIONS MADE BY THE CONSULTANT AND ANY TEST RESULTS WILL BE SENT TO YOUR REFERRING PHYSICIAN.  You were seen today by Mike Craze NP. Continue labs every 6-8 weeks. Return in 4 months for labs and follow up.    Thank you for choosing [place] at [facility] to provide your oncology and hematology care.  To afford each patient quality time with our provider, please arrive at least 15 minutes before your scheduled appointment time.    If you have a lab appointment with [facility] please come in thru the  Main Entrance and check in at the main information desk  You need to re-schedule your appointment should you arrive 10 or more minutes late.  We strive to give you quality time with our providers, and arriving late affects you and other patients whose appointments are after yours.  Also, if you no show three or more times for appointments you may be dismissed from the clinic at the providers discretion.     Again, thank you for choosing [facility].  Our hope is that these requests will decrease the amount of time that you wait before being seen by our physicians.       _____________________________________________________________  Should you have questions after your visit to [facility], please contact our office at [phone] between the hours of 8:30 a.m. and 4:30 p.m.  Voicemails left after 4:30 p.m. will not be returned until the following business day.  For prescription refill requests, have your pharmacy contact our office.       Resources For Cancer Patients and their Caregivers ? American Cancer Society: Can assist with transportation, wigs, general needs, runs Look Good Feel Better.        (919)189-9927 ? Cancer Care: Provides financial assistance, online support groups, medication/co-pay assistance.  1-800-813-HOPE  270-683-9051) ? [facility] Assists [place] cancer patients and their families through emotional , educational and financial support.  (919) 116-6886 ? [place] DSS Where to apply for food stamps, Medicaid and utility assistance. (757) 316-7460 ? RCATS: Transportation to medical appointments. 405-109-0797 ? Social Security Administration: May apply for disability if have a Stage IV cancer. 980-116-8159 772-646-3555 ? [employer], Disability and Transit Services: Assists with nutrition, care and transit needs. [place] Support Programs: @10RELATIVEDAYS @ > Cancer Support Group  2nd Tuesday of the month 1pm-2pm, Journey Room  > Creative Journey  3rd Tuesday of the month 1130am-1pm, Journey Room  > Look Good Feel Better  1st Wednesday of the month 10am-12 noon, Journey Room (Call [place] to register [phone])

## 2017-05-30 DIAGNOSIS — I480 Paroxysmal atrial fibrillation: Secondary | ICD-10-CM | POA: Diagnosis not present

## 2017-05-30 DIAGNOSIS — I2510 Atherosclerotic heart disease of native coronary artery without angina pectoris: Secondary | ICD-10-CM | POA: Diagnosis not present

## 2017-05-30 DIAGNOSIS — Z79899 Other long term (current) drug therapy: Secondary | ICD-10-CM | POA: Diagnosis not present

## 2017-05-30 DIAGNOSIS — N183 Chronic kidney disease, stage 3 (moderate): Secondary | ICD-10-CM | POA: Diagnosis not present

## 2017-06-04 DIAGNOSIS — N183 Chronic kidney disease, stage 3 (moderate): Secondary | ICD-10-CM | POA: Diagnosis not present

## 2017-06-04 DIAGNOSIS — J209 Acute bronchitis, unspecified: Secondary | ICD-10-CM | POA: Diagnosis not present

## 2017-06-12 ENCOUNTER — Ambulatory Visit (INDEPENDENT_AMBULATORY_CARE_PROVIDER_SITE_OTHER): Payer: Medicare Other | Admitting: *Deleted

## 2017-06-12 DIAGNOSIS — I4891 Unspecified atrial fibrillation: Secondary | ICD-10-CM | POA: Diagnosis not present

## 2017-06-12 DIAGNOSIS — Z952 Presence of prosthetic heart valve: Secondary | ICD-10-CM | POA: Diagnosis not present

## 2017-06-12 LAB — POCT INR: INR: 2.9

## 2017-07-04 ENCOUNTER — Encounter (HOSPITAL_COMMUNITY): Payer: Medicare Other | Attending: Oncology

## 2017-07-04 DIAGNOSIS — D500 Iron deficiency anemia secondary to blood loss (chronic): Secondary | ICD-10-CM | POA: Diagnosis not present

## 2017-07-04 LAB — CBC WITH DIFFERENTIAL/PLATELET
BASOS PCT: 1 %
Basophils Absolute: 0.1 10*3/uL (ref 0.0–0.1)
Eosinophils Absolute: 0.7 10*3/uL (ref 0.0–0.7)
Eosinophils Relative: 8 %
HEMATOCRIT: 42.3 % (ref 39.0–52.0)
HEMOGLOBIN: 13.9 g/dL (ref 13.0–17.0)
LYMPHS ABS: 1.5 10*3/uL (ref 0.7–4.0)
Lymphocytes Relative: 19 %
MCH: 32.6 pg (ref 26.0–34.0)
MCHC: 32.9 g/dL (ref 30.0–36.0)
MCV: 99.3 fL (ref 78.0–100.0)
MONO ABS: 0.6 10*3/uL (ref 0.1–1.0)
MONOS PCT: 8 %
NEUTROS ABS: 5.3 10*3/uL (ref 1.7–7.7)
NEUTROS PCT: 64 %
Platelets: 155 10*3/uL (ref 150–400)
RBC: 4.26 MIL/uL (ref 4.22–5.81)
RDW: 14.3 % (ref 11.5–15.5)
WBC: 8.1 10*3/uL (ref 4.0–10.5)

## 2017-07-04 LAB — BASIC METABOLIC PANEL
Anion gap: 7 (ref 5–15)
BUN: 19 mg/dL (ref 6–20)
CALCIUM: 9 mg/dL (ref 8.9–10.3)
CHLORIDE: 102 mmol/L (ref 101–111)
CO2: 30 mmol/L (ref 22–32)
CREATININE: 1.11 mg/dL (ref 0.61–1.24)
GFR calc non Af Amer: 58 mL/min — ABNORMAL LOW (ref >60)
GLUCOSE: 101 mg/dL — AB (ref 65–99)
Potassium: 3.8 mmol/L (ref 3.5–5.1)
Sodium: 139 mmol/L (ref 135–145)

## 2017-07-04 LAB — FERRITIN: Ferritin: 134 ng/mL (ref 24–336)

## 2017-07-04 LAB — IRON AND TIBC
IRON: 73 ug/dL (ref 45–182)
Saturation Ratios: 25 % (ref 17.9–39.5)
TIBC: 293 ug/dL (ref 250–450)
UIBC: 220 ug/dL

## 2017-07-11 ENCOUNTER — Encounter: Payer: Medicare Other | Admitting: *Deleted

## 2017-07-11 ENCOUNTER — Telehealth: Payer: Self-pay | Admitting: Cardiology

## 2017-07-11 NOTE — Telephone Encounter (Signed)
LMOVM reminding pt to send remote transmission.   

## 2017-07-24 ENCOUNTER — Ambulatory Visit (INDEPENDENT_AMBULATORY_CARE_PROVIDER_SITE_OTHER): Payer: Medicare Other | Admitting: *Deleted

## 2017-07-24 DIAGNOSIS — Z952 Presence of prosthetic heart valve: Secondary | ICD-10-CM

## 2017-07-24 DIAGNOSIS — I4891 Unspecified atrial fibrillation: Secondary | ICD-10-CM

## 2017-07-24 DIAGNOSIS — Z5181 Encounter for therapeutic drug level monitoring: Secondary | ICD-10-CM | POA: Diagnosis not present

## 2017-07-24 LAB — POCT INR: INR: 2.8

## 2017-07-29 DIAGNOSIS — Z23 Encounter for immunization: Secondary | ICD-10-CM | POA: Diagnosis not present

## 2017-08-30 ENCOUNTER — Encounter (HOSPITAL_COMMUNITY): Payer: Medicare Other | Attending: Oncology | Admitting: Oncology

## 2017-08-30 ENCOUNTER — Encounter (HOSPITAL_COMMUNITY): Payer: Medicare Other

## 2017-08-30 ENCOUNTER — Encounter (HOSPITAL_COMMUNITY): Payer: Self-pay | Admitting: Oncology

## 2017-08-30 ENCOUNTER — Other Ambulatory Visit: Payer: Self-pay | Admitting: Internal Medicine

## 2017-08-30 VITALS — BP 131/64 | HR 74 | Temp 97.7°F | Resp 18 | Wt 203.2 lb

## 2017-08-30 DIAGNOSIS — I2510 Atherosclerotic heart disease of native coronary artery without angina pectoris: Secondary | ICD-10-CM | POA: Diagnosis not present

## 2017-08-30 DIAGNOSIS — M109 Gout, unspecified: Secondary | ICD-10-CM | POA: Insufficient documentation

## 2017-08-30 DIAGNOSIS — Z8249 Family history of ischemic heart disease and other diseases of the circulatory system: Secondary | ICD-10-CM | POA: Insufficient documentation

## 2017-08-30 DIAGNOSIS — Z953 Presence of xenogenic heart valve: Secondary | ICD-10-CM | POA: Insufficient documentation

## 2017-08-30 DIAGNOSIS — D500 Iron deficiency anemia secondary to blood loss (chronic): Secondary | ICD-10-CM | POA: Diagnosis not present

## 2017-08-30 DIAGNOSIS — Z951 Presence of aortocoronary bypass graft: Secondary | ICD-10-CM | POA: Diagnosis not present

## 2017-08-30 DIAGNOSIS — Z86010 Personal history of colonic polyps: Secondary | ICD-10-CM | POA: Diagnosis not present

## 2017-08-30 DIAGNOSIS — M25562 Pain in left knee: Secondary | ICD-10-CM | POA: Diagnosis not present

## 2017-08-30 DIAGNOSIS — M25561 Pain in right knee: Secondary | ICD-10-CM | POA: Insufficient documentation

## 2017-08-30 DIAGNOSIS — Z950 Presence of cardiac pacemaker: Secondary | ICD-10-CM | POA: Diagnosis not present

## 2017-08-30 DIAGNOSIS — I4891 Unspecified atrial fibrillation: Secondary | ICD-10-CM | POA: Insufficient documentation

## 2017-08-30 DIAGNOSIS — K5900 Constipation, unspecified: Secondary | ICD-10-CM

## 2017-08-30 DIAGNOSIS — I10 Essential (primary) hypertension: Secondary | ICD-10-CM | POA: Diagnosis not present

## 2017-08-30 DIAGNOSIS — M797 Fibromyalgia: Secondary | ICD-10-CM | POA: Diagnosis not present

## 2017-08-30 DIAGNOSIS — I442 Atrioventricular block, complete: Secondary | ICD-10-CM | POA: Diagnosis not present

## 2017-08-30 DIAGNOSIS — M353 Polymyalgia rheumatica: Secondary | ICD-10-CM | POA: Insufficient documentation

## 2017-08-30 DIAGNOSIS — M1990 Unspecified osteoarthritis, unspecified site: Secondary | ICD-10-CM | POA: Insufficient documentation

## 2017-08-30 DIAGNOSIS — Z85828 Personal history of other malignant neoplasm of skin: Secondary | ICD-10-CM | POA: Diagnosis not present

## 2017-08-30 DIAGNOSIS — Z841 Family history of disorders of kidney and ureter: Secondary | ICD-10-CM | POA: Insufficient documentation

## 2017-08-30 DIAGNOSIS — Z79899 Other long term (current) drug therapy: Secondary | ICD-10-CM | POA: Diagnosis not present

## 2017-08-30 DIAGNOSIS — Z7901 Long term (current) use of anticoagulants: Secondary | ICD-10-CM | POA: Insufficient documentation

## 2017-08-30 DIAGNOSIS — G8929 Other chronic pain: Secondary | ICD-10-CM | POA: Insufficient documentation

## 2017-08-30 DIAGNOSIS — Z888 Allergy status to other drugs, medicaments and biological substances status: Secondary | ICD-10-CM | POA: Insufficient documentation

## 2017-08-30 LAB — CBC WITH DIFFERENTIAL/PLATELET
Basophils Absolute: 0 10*3/uL (ref 0.0–0.1)
Basophils Relative: 0 %
EOS ABS: 0.5 10*3/uL (ref 0.0–0.7)
EOS PCT: 5 %
HCT: 43.2 % (ref 39.0–52.0)
HEMOGLOBIN: 14.1 g/dL (ref 13.0–17.0)
LYMPHS ABS: 1.2 10*3/uL (ref 0.7–4.0)
Lymphocytes Relative: 14 %
MCH: 32.6 pg (ref 26.0–34.0)
MCHC: 32.6 g/dL (ref 30.0–36.0)
MCV: 100 fL (ref 78.0–100.0)
MONOS PCT: 8 %
Monocytes Absolute: 0.7 10*3/uL (ref 0.1–1.0)
NEUTROS PCT: 73 %
Neutro Abs: 6.6 10*3/uL (ref 1.7–7.7)
Platelets: 186 10*3/uL (ref 150–400)
RBC: 4.32 MIL/uL (ref 4.22–5.81)
RDW: 14.2 % (ref 11.5–15.5)
WBC: 9 10*3/uL (ref 4.0–10.5)

## 2017-08-30 LAB — IRON AND TIBC
Iron: 55 ug/dL (ref 45–182)
SATURATION RATIOS: 18 % (ref 17.9–39.5)
TIBC: 302 ug/dL (ref 250–450)
UIBC: 247 ug/dL

## 2017-08-30 LAB — BASIC METABOLIC PANEL
Anion gap: 9 (ref 5–15)
BUN: 30 mg/dL — AB (ref 6–20)
CHLORIDE: 101 mmol/L (ref 101–111)
CO2: 27 mmol/L (ref 22–32)
Calcium: 9.7 mg/dL (ref 8.9–10.3)
Creatinine, Ser: 1.36 mg/dL — ABNORMAL HIGH (ref 0.61–1.24)
GFR calc Af Amer: 52 mL/min — ABNORMAL LOW (ref >60)
GFR calc non Af Amer: 45 mL/min — ABNORMAL LOW (ref >60)
GLUCOSE: 111 mg/dL — AB (ref 65–99)
Potassium: 4.6 mmol/L (ref 3.5–5.1)
SODIUM: 137 mmol/L (ref 135–145)

## 2017-08-30 LAB — FERRITIN: Ferritin: 121 ng/mL (ref 24–336)

## 2017-08-30 NOTE — Patient Instructions (Signed)
[place] at [facility] Discharge Instructions  RECOMMENDATIONS MADE BY THE CONSULTANT AND ANY TEST RESULTS WILL BE SENT TO YOUR REFERRING PHYSICIAN.  Seen by Dr. Jeb Levering today Follow up in 6 months.  Thank you for choosing [place] at [facility] to provide your oncology and hematology care.  To afford each patient quality time with our provider, please arrive at least 15 minutes before your scheduled appointment time.    If you have a lab appointment with the [facility] [place] please come in thru the  Main Entrance and check in at the main information desk  You need to re-schedule your appointment should you arrive 10 or more minutes late.  We strive to give you quality time with our providers, and arriving late affects you and other patients whose appointments are after yours.  Also, if you no show three or more times for appointments you may be dismissed from the clinic at the providers discretion.     Again, thank you for choosing [facility].  Our hope is that these requests will decrease the amount of time that you wait before being seen by our physicians.       _____________________________________________________________  Should you have questions after your visit to [facility], please contact our office at [phone] between the hours of 8:30 a.m. and 4:30 p.m.  Voicemails left after 4:30 p.m. will not be returned until the following business day.  For prescription refill requests, have your pharmacy contact our office.       Resources For Cancer Patients and their Caregivers ? American Cancer Society: Can assist with transportation, wigs, general needs, runs Look Good Feel Better.        540-327-2943 ? Cancer Care: Provides financial assistance, online support groups, medication/co-pay assistance.  1-800-813-HOPE 612-307-4057) ? [place] Assists [place] cancer patients and  their families through emotional , educational and financial support.  573-466-7095 ? [place] DSS Where to apply for food stamps, Medicaid and utility assistance. 289-296-4963 ? [facility]: Transportation to medical appointments. (225) 497-1731 ? Social Security Administration: May apply for disability if have a Stage IV cancer. 253-058-2643 919-590-2486 ? [employer], Disability and Transit Services: Assists with nutrition, care and transit needs. [place] Support Programs: @10RELATIVEDAYS @ > Cancer Support Group  2nd Tuesday of the month 1pm-2pm, Journey Room  > Creative Journey  3rd Tuesday of the month 1130am-1pm, Journey Room  > Look Good Feel Better  1st Wednesday of the month 10am-12 noon, Journey Room (Call [place] to register [phone])

## 2017-08-30 NOTE — Progress Notes (Signed)
[place], [place] [address]   CLINIC:  Medical Oncology/Hematology  PCP:  [name], MD [address] [place] [address] [phone]   REASON FOR VISIT:  Follow-up for Iron deficiency anemia d/t chronic GI blood loss with AVMs  CURRENT THERAPY: IV iron prn    HISTORY OF PRESENT ILLNESS:  (From Kirby Crigler, PA-C's last note on 01/17/17)     INTERVAL HISTORY:  Mr. Gilchrest 83 y.o. male returns for follow-up for iron deficiency anemia.   Overall, he tells me he has been feeling quite well.  Appetite is 100%; energy levels are 75%. Endorses occasional fatigue, "but I think anybody that is 83 years old is going to feel tired sometimes."    Denies any frank bleeding episodes including blood in his stools, dark/tarry stools, or hematuria. Endorses occasional constipation recently. He was struggling with diarrhea "and now my bowels have gone the other way." He takes a laxative occasionally. States that the shape of his stools are thin. Shares with me that he had hemorrhoid surgery many years ago and "he sewed me up too tight."   Denies pica or pagophagia.  Overall, he is largely without complaints today.   He remains very active in his garden; currently has been tending to corn, potatoes, tomatoes, cantaloupe, & green beans. He has been a farmer "all my life."  He has bilateral knee pain, which has been chronic and has required steroid injections intermittently.  He and his wife will celebrate their 50th wedding anniversary this year. They had 4 children and have several grandchildren and great-grandchildren as well.   August 29, 2017 Patient is here for further follow-up regarding iron deficiency anemia.  Patient remains asymptomatic.  Patient did not have any colonoscopy in the last several years.  Recent complaints of constipation and some difficulty in passing stool. [place]  globin is improved to 14 g  REVIEW OF SYSTEMS:  Review  of Systems  Constitutional: Positive for fatigue. Negative for chills and fever.  HENT:  Negative.  Negative for lump/mass and nosebleeds.   Eyes: Negative.   Respiratory: Negative.  Negative for cough and shortness of breath.   Cardiovascular: Negative.  Negative for chest pain and leg swelling.  Gastrointestinal: Positive for constipation. Negative for abdominal pain, blood in stool, diarrhea, nausea and vomiting.  Endocrine: Negative.   Genitourinary: Negative.  Negative for dysuria and hematuria.   Musculoskeletal: Positive for arthralgias (knee pain ).  Skin: Negative.  Negative for rash.  Neurological: Negative.  Negative for dizziness and headaches.  Hematological: Negative.  Negative for adenopathy. Does not bruise/bleed easily.  Psychiatric/Behavioral: Negative.  Negative for depression and sleep disturbance. The patient is not nervous/anxious.      PAST MEDICAL/SURGICAL HISTORY:  Past Medical History:  Diagnosis Date  . Aneurysm of thoracic aorta (HCC)    Ascending. 5.7 x 5.5cm - Bentall procedure  . Aortic stenosis    Bioprosthetic AVR  . Arthritis   . Atrial fibrillation ([place])    Documented on pacer interrogation 6/13  . Complete heart block ([place])    Status post pacemaker placement  . Coronary atherosclerosis of native coronary artery    Multivessel - LIMA to LAD, SVG to OM, SVG to PDA  . DJD (degenerative joint disease)   . Esophageal dysmotility   . Essential hypertension, benign   . Fibromyalgia   . Gout   . H/O hiatal hernia   . History of skin cancer   .  Hx of adenomatous colonic polyps   . Iron deficiency anemia due to chronic blood loss 10/03/2016  . PMR (polymyalgia rheumatica) (HCC)   . Presence of permanent cardiac pacemaker    Past Surgical History:  Procedure Laterality Date  . AORTIC VALVE REPLACEMENT  01/24/12  . APPENDECTOMY    . [place] or 1966 lumbar back surgery  . BILATERAL KNEE ARTHROSCOPY    . HEMORROIDECTOMY    . HERNIA  REPAIR    . Left inguinal hernia repair    . PACEMAKER PLACEMENT     [employer] 4/13 - Dr. Rayann Heman     SOCIAL HISTORY:  Social History   Socioeconomic History  . Marital status: Married    Spouse name: Not on file  . Number of children: 4  . Years of education: Not on file  . Highest education level: Not on file  Social Needs  . Financial resource strain: Not on file  . Food insecurity - worry: Not on file  . Food insecurity - inability: Not on file  . Transportation needs - medical: Not on file  . Transportation needs - non-medical: Not on file  Occupational History  . Occupation: Retired    Comment: Scientist, forensic  Tobacco Use  . Smoking status: Never Smoker  . Smokeless tobacco: Never Used  Substance and Sexual Activity  . Alcohol use: No    Alcohol/week: 0.0 oz  . Drug use: No  . Sexual activity: No    Birth control/protection: None  Other Topics Concern  . Not on file  Social History Narrative   Lives with wife, has been married since 83.    FAMILY HISTORY:  Family History  Problem Relation Age of Onset  . Kidney disease Mother   . Heart disease Father   . Anesthesia problems Neg Hx   . Hypotension Neg Hx   . Malignant hyperthermia Neg Hx   . Pseudochol deficiency Neg Hx     CURRENT MEDICATIONS:  Outpatient Encounter Medications as of 08/30/2017  Medication Sig  . acetaminophen (TYLENOL) 500 MG tablet Take 1,000 mg by mouth every 6 (six) hours as needed for mild pain.  Marland Kitchen allopurinol (ZYLOPRIM) 300 MG tablet Take 450 mg by mouth daily. Take 1 1/2 daily  . amoxicillin (AMOXIL) 500 MG capsule   . furosemide (LASIX) 40 MG tablet Take 1 tablet (40 mg total) by mouth daily.  . metoprolol succinate (TOPROL-XL) 25 MG 24 hr tablet TAKE (2) TABLETS BY MOUTH DAILY.  . pantoprazole (PROTONIX) 40 MG tablet TAKE ONE TABLET BY MOUTH ONCE DAILY IN THE MORNING.  . pravastatin (PRAVACHOL) 20 MG tablet Take 20 mg by mouth every evening.   . warfarin (COUMADIN) 5 MG tablet  Take 1 tablet daily except 1/2 tablet on Sundays   No facility-administered encounter medications on file as of 08/30/2017.     ALLERGIES:  Allergies  Allergen Reactions  . Imdur [Isosorbide Nitrate] Other (See Comments)    Nausea and lightheaded      PHYSICAL EXAM:  ECOG Performance status: 0 - Asymptomatic and independent   Vitals:   08/30/17 1127  BP: 131/64  Pulse: 74  Resp: 18  Temp: 97.7 F (36.5 C)  SpO2: 97%   Filed Weights   08/30/17 1127  Weight: 203 lb 3.2 oz (92.2 kg)    Physical Exam  Constitutional: He is oriented to person, place, and time and well-developed, well-nourished, and in no distress.  HENT:  Head: Normocephalic.  Mouth/Throat: Oropharynx is clear and moist. No oropharyngeal exudate.  Eyes: Conjunctivae are normal. Pupils are equal, round, and reactive to light. No scleral icterus.  Neck: Normal range of motion. Neck supple.  Cardiovascular: Normal rate and regular rhythm.  Systolic click  Pulmonary/Chest: Effort normal and breath sounds normal. No respiratory distress.  Abdominal: Soft. Bowel sounds are normal. There is no tenderness.  Musculoskeletal: Normal range of motion. He exhibits edema (1+ BLE/ankle edema ).  Lymphadenopathy:    He has no cervical adenopathy.       Right: No supraclavicular adenopathy present.       Left: No supraclavicular adenopathy present.  Neurological: He is alert and oriented to person, place, and time. No cranial nerve deficit. Gait normal.  Skin: Skin is warm and dry. No rash noted.  Psychiatric: Mood, memory, affect and judgment normal.  Nursing note and vitals reviewed.    LABORATORY DATA:  I have reviewed the labs as listed.  CBC    Component Value Date/Time   WBC 9.0 08/30/2017 1101   RBC 4.32 08/30/2017 1101   HGB 14.1 08/30/2017 1101   HCT 43.2 08/30/2017 1101   PLT 186 08/30/2017 1101   MCV 100.0 08/30/2017 1101   MCH 32.6 08/30/2017 1101   MCHC 32.6 08/30/2017 1101   RDW 14.2  08/30/2017 1101   LYMPHSABS 1.2 08/30/2017 1101   MONOABS 0.7 08/30/2017 1101   EOSABS 0.5 08/30/2017 1101   BASOSABS 0.0 08/30/2017 1101   CMP Latest Ref Rng & Units 08/30/2017 07/04/2017 05/09/2017  Glucose 65 - 99 mg/dL 111(H) 101(H) 115(H)  BUN 6 - 20 mg/dL 30(H) 19 23(H)  Creatinine 0.61 - 1.24 mg/dL 1.36(H) 1.11 1.10  Sodium 135 - 145 mmol/L 137 139 138  Potassium 3.5 - 5.1 mmol/L 4.6 3.8 4.1  Chloride 101 - 111 mmol/L 101 102 100(L)  CO2 22 - 32 mmol/L 27 30 29   Calcium 8.9 - 10.3 mg/dL 9.7 9.0 9.2  Total Protein 6.5 - 8.1 g/dL - - -  Total Bilirubin 0.3 - 1.2 mg/dL - - -  Alkaline Phos 38 - 126 U/L - - -  AST 15 - 41 U/L - - -  ALT 17 - 63 U/L - - -    PENDING LABS:    DIAGNOSTIC IMAGING:  EGD: 05/19/16     PATHOLOGY:       ASSESSMENT & PLAN:   Iron deficiency anemia:  -Thought to be secondary to chronic GI blood loss with AVMs. Initially treated with po iron, but unable to tolerate d/t constipation. Has been getting IV iron when needed.  -Clinically he is doing well with no reported blood in his stools, dark/tarry stools, or hematuria. No pica or pagophagia.  -Last IV iron infusion was 01/24/17; prior to that 12/12/16.  Oncology Flowsheet 12/12/2016 01/24/2017  ferumoxytol (FERAHEME) IV 510 mg 510 mg  -Iron studies are pending for today; we will contact him with results when they are available and make arrangements for IV iron if needed.  -Will obtain serial lab monitoring every 6-8 weeks.  -Return to cancer center in 4 months for continued follow-up with labs.   Mild constipation:  -Encouraged OTC Miralax daily with stool softeners. Not likely to be d/t intermittent IV iron. -Recommended he follow-up with his PCP or other specialists if bowel habits do not improve with OTC interventions. Encouraged him to report any additional changes in his bowels that persist (like the thin shaped stools) to his PCP as well.  Dispo:  Return clinic appointment in  7-month Patient was advised to check with gastroenterologist to see if sigmoidoscopy is needed because of patient's continuing blood loss plus constipation and some difficulty passing stool.  Patient is going to check with Dr. Laural Golden regarding that.  Orders placed this encounter:  No orders of the defined types were placed in this encounter.    Delorise Shiner Jailine Lieder

## 2017-09-04 ENCOUNTER — Ambulatory Visit (INDEPENDENT_AMBULATORY_CARE_PROVIDER_SITE_OTHER): Payer: Medicare Other | Admitting: *Deleted

## 2017-09-04 DIAGNOSIS — Z952 Presence of prosthetic heart valve: Secondary | ICD-10-CM | POA: Diagnosis not present

## 2017-09-04 DIAGNOSIS — I4891 Unspecified atrial fibrillation: Secondary | ICD-10-CM

## 2017-09-04 LAB — POCT INR: INR: 3.5

## 2017-09-26 DIAGNOSIS — N183 Chronic kidney disease, stage 3 (moderate): Secondary | ICD-10-CM | POA: Diagnosis not present

## 2017-09-26 DIAGNOSIS — Z79899 Other long term (current) drug therapy: Secondary | ICD-10-CM | POA: Diagnosis not present

## 2017-10-01 ENCOUNTER — Other Ambulatory Visit: Payer: Self-pay | Admitting: Cardiology

## 2017-10-03 DIAGNOSIS — I5032 Chronic diastolic (congestive) heart failure: Secondary | ICD-10-CM | POA: Diagnosis not present

## 2017-10-03 DIAGNOSIS — N183 Chronic kidney disease, stage 3 (moderate): Secondary | ICD-10-CM | POA: Diagnosis not present

## 2017-10-03 DIAGNOSIS — I480 Paroxysmal atrial fibrillation: Secondary | ICD-10-CM | POA: Diagnosis not present

## 2017-10-09 ENCOUNTER — Ambulatory Visit (INDEPENDENT_AMBULATORY_CARE_PROVIDER_SITE_OTHER): Payer: Medicare Other | Admitting: *Deleted

## 2017-10-09 DIAGNOSIS — Z952 Presence of prosthetic heart valve: Secondary | ICD-10-CM

## 2017-10-09 DIAGNOSIS — I4891 Unspecified atrial fibrillation: Secondary | ICD-10-CM | POA: Diagnosis not present

## 2017-10-09 LAB — POCT INR: INR: 2.4

## 2017-10-31 ENCOUNTER — Ambulatory Visit: Payer: Medicare Other | Admitting: Gastroenterology

## 2017-11-04 ENCOUNTER — Encounter: Payer: Self-pay | Admitting: Internal Medicine

## 2017-11-04 ENCOUNTER — Ambulatory Visit (INDEPENDENT_AMBULATORY_CARE_PROVIDER_SITE_OTHER): Payer: Medicare Other | Admitting: *Deleted

## 2017-11-04 DIAGNOSIS — I4891 Unspecified atrial fibrillation: Secondary | ICD-10-CM | POA: Diagnosis not present

## 2017-11-04 DIAGNOSIS — Z952 Presence of prosthetic heart valve: Secondary | ICD-10-CM | POA: Diagnosis not present

## 2017-11-04 DIAGNOSIS — Z5181 Encounter for therapeutic drug level monitoring: Secondary | ICD-10-CM | POA: Diagnosis not present

## 2017-11-04 LAB — POCT INR: INR: 3.2

## 2017-11-04 NOTE — Patient Instructions (Signed)
Hold coumadin tonight then resume 1 tablet daily  Scheduled for dental extractions on 11/05/17 by Dr Benson Norway  Fax [phone] [place] Faxed results from today.  Recheck in 4 weeks

## 2017-11-07 ENCOUNTER — Ambulatory Visit (INDEPENDENT_AMBULATORY_CARE_PROVIDER_SITE_OTHER): Payer: Medicare Other | Admitting: *Deleted

## 2017-11-07 DIAGNOSIS — Z45018 Encounter for adjustment and management of other part of cardiac pacemaker: Secondary | ICD-10-CM

## 2017-11-07 DIAGNOSIS — I442 Atrioventricular block, complete: Secondary | ICD-10-CM | POA: Diagnosis not present

## 2017-11-07 DIAGNOSIS — I480 Paroxysmal atrial fibrillation: Secondary | ICD-10-CM | POA: Diagnosis not present

## 2017-11-07 LAB — CUP PACEART INCLINIC DEVICE CHECK
Battery Impedance: 551 Ohm
Battery Remaining Longevity: 80 mo
Battery Voltage: 2.77 V
Brady Statistic AP VP Percent: 76 %
Brady Statistic AP VS Percent: 0 %
Brady Statistic AS VP Percent: 24 %
Date Time Interrogation Session: 20190117163002
Implantable Lead Implant Date: 20130412
Implantable Lead Location: 753859
Implantable Lead Location: 753860
Implantable Lead Model: 5076
Implantable Lead Model: 5076
Implantable Pulse Generator Implant Date: 20130412
Lead Channel Impedance Value: 571 Ohm
Lead Channel Pacing Threshold Amplitude: 0.5 V
Lead Channel Pacing Threshold Pulse Width: 0.4 ms
Lead Channel Sensing Intrinsic Amplitude: 0.7 mV
Lead Channel Setting Pacing Amplitude: 2.5 V
MDC IDC LEAD IMPLANT DT: 20130412
MDC IDC MSMT LEADCHNL RA IMPEDANCE VALUE: 479 Ohm
MDC IDC MSMT LEADCHNL RV PACING THRESHOLD AMPLITUDE: 0.75 V
MDC IDC MSMT LEADCHNL RV PACING THRESHOLD PULSEWIDTH: 0.4 ms
MDC IDC SET LEADCHNL RA PACING AMPLITUDE: 2 V
MDC IDC SET LEADCHNL RV PACING PULSEWIDTH: 0.4 ms
MDC IDC SET LEADCHNL RV SENSING SENSITIVITY: 4 mV
MDC IDC STAT BRADY AS VS PERCENT: 0 %

## 2017-11-07 NOTE — Patient Instructions (Addendum)
Your physician wants you to follow-up in: June 2019 with Dr. Taylor. You will receive a reminder letter in the mail two months in advance. If you don't receive a letter, please call our office to schedule the follow-up appointment. 

## 2017-11-07 NOTE — Progress Notes (Signed)
Pacemaker check in clinic. Normal device function. Thresholds, sensing, impedances consistent with previous measurements. Device programmed to maximize longevity. 69 AHR episodes (0.2% burden)--AF +warfarin, longest 1hr 33min. 8 high ventricular rates noted--markers suggest NSVT, longest 8sec. Device programmed at appropriate safety margins; RV min output increased to 2.5V. Histogram distribution appropriate for patient activity level. Estimated longevity 6.5 years. Patient declines remote monitoring at this time but reports he may consider it in the future. Patient education completed. ROV with GT/R on 04/09/18.

## 2017-12-02 ENCOUNTER — Ambulatory Visit (INDEPENDENT_AMBULATORY_CARE_PROVIDER_SITE_OTHER): Payer: Medicare Other | Admitting: *Deleted

## 2017-12-02 DIAGNOSIS — Z952 Presence of prosthetic heart valve: Secondary | ICD-10-CM | POA: Diagnosis not present

## 2017-12-02 DIAGNOSIS — I4891 Unspecified atrial fibrillation: Secondary | ICD-10-CM | POA: Diagnosis not present

## 2017-12-02 LAB — POCT INR: INR: 2.9

## 2017-12-02 NOTE — Patient Instructions (Signed)
Continue coumadin 1 tablet daily Recheck in 4 weeks 

## 2017-12-11 ENCOUNTER — Ambulatory Visit (INDEPENDENT_AMBULATORY_CARE_PROVIDER_SITE_OTHER): Payer: Medicare Other | Admitting: Gastroenterology

## 2017-12-11 ENCOUNTER — Encounter: Payer: Self-pay | Admitting: Gastroenterology

## 2017-12-11 VITALS — BP 136/72 | HR 72 | Temp 97.1°F | Ht 68.0 in | Wt 203.4 lb

## 2017-12-11 DIAGNOSIS — K649 Unspecified hemorrhoids: Secondary | ICD-10-CM | POA: Diagnosis not present

## 2017-12-11 DIAGNOSIS — K5900 Constipation, unspecified: Secondary | ICD-10-CM | POA: Diagnosis not present

## 2017-12-11 DIAGNOSIS — D500 Iron deficiency anemia secondary to blood loss (chronic): Secondary | ICD-10-CM

## 2017-12-11 HISTORY — DX: Unspecified hemorrhoids: K64.9

## 2017-12-11 MED ORDER — POLYETHYLENE GLYCOL 3350 17 GM/SCOOP PO POWD: ORAL | 3 refills | Status: DC

## 2017-12-11 MED ORDER — LUBIPROSTONE 8 MCG PO CAPS: ORAL_CAPSULE | ORAL | 3 refills | Status: DC

## 2017-12-11 NOTE — Progress Notes (Addendum)
REVIEWED. REFER PT TO SURGERY FOR TO DISCUSS THE BENEFITS V. RISKS OF HEMORRHOIDECTOMY.  Primary Care Physician:  [name], MD  Primary Gastroenterologist:  Dr. Hildred Laser  Chief Complaint  Patient presents with  . Hemorrhoids    not currently painful/bleeding    HPI:  Douglas [name] is a 84 y.o. male here at request of Dr. Willey Blade for further evaluation of painful hemorrhoids. Patient previously seen at [facility] by Dr. Laural Golden and continued his care with him. Seen during hospitalization in 2013 by Dr. Laural Golden. We saw the patient initially in 04/2016 during hospitalization while covering for Dr. Laural Golden.   Patient complains of hemorrhoids discomfort that started about a month ago. Pain was severe. No rectal bleeding. He chronically takes something to move his bowels. Called PCP and requested referral for hemorrhoids. Fortunately his pain is now gone. He used Sitz baths and prep H cream. He was not evaluated during the flare. He had prostate exam last year and per patient he was told he was "tight".   Patient wonders about small stool caliber. He takes "a little red pill" every day which may be a stool softener, unclear if it has bisacodyl in it as well. Also takes Dulcolax one pill every 3 days or so. Patient reports miralax has been ineffective in the past.   He denies abd pain. No heartburn, dysphagia, vomiting. No melena, brbpr.   Last colonoscopy over 10 years ago. He reports remote hemorrhoidectomy in the 1960s.   Patient is followed by hematology for IDA due to chronic GI blood loss with h/o AVM (large gastric avm ablated in 2017) in setting of coumadin. Last iron infusion 01/2017.   Current Outpatient Medications  Medication Sig Dispense Refill  . acetaminophen (TYLENOL) 500 MG tablet Take 1,000 mg by mouth every 6 (six) hours as needed for mild pain.    Marland Kitchen allopurinol (ZYLOPRIM) 300 MG tablet Take 450 mg by mouth daily. Take 1 1/2 daily    . amoxicillin (AMOXIL) 500 MG capsule   11  .  furosemide (LASIX) 40 MG tablet TAKE 1 TABLET BY MOUTH ONCE DAILY. 30 tablet 0  . metoprolol succinate (TOPROL-XL) 25 MG 24 hr tablet TAKE (2) TABLETS BY MOUTH DAILY. 60 tablet 9  . pantoprazole (PROTONIX) 40 MG tablet TAKE ONE TABLET BY MOUTH ONCE DAILY IN THE MORNING. 30 tablet 6  . pravastatin (PRAVACHOL) 20 MG tablet Take 20 mg by mouth every evening.   11  . warfarin (COUMADIN) 5 MG tablet Take 1 tablet daily except 1/2 tablet on Sundays 90 tablet 4   No current facility-administered medications for this visit.     Allergies as of 12/11/2017 - Review Complete 12/11/2017  Allergen Reaction Noted  . Imdur [isosorbide nitrate] Other (See Comments) 12/28/2015    Past Medical History:  Diagnosis Date  . Aneurysm of thoracic aorta (HCC)    Ascending. 5.7 x 5.5cm - Bentall procedure  . Aortic stenosis    Bioprosthetic AVR  . Arthritis   . Atrial fibrillation ([place])    Documented on pacer interrogation 6/13  . Complete heart block ([place])    Status post pacemaker placement  . Coronary atherosclerosis of native coronary artery    Multivessel - LIMA to LAD, SVG to OM, SVG to PDA  . DJD (degenerative joint disease)   . Esophageal dysmotility   . Essential hypertension, benign   . Fibromyalgia   . Gout   . H/O hiatal hernia   . History of skin cancer   .  Hx of adenomatous colonic polyps   . Iron deficiency anemia due to chronic blood loss 10/03/2016  . PMR (polymyalgia rheumatica) (HCC)   . Presence of permanent cardiac pacemaker     Past Surgical History:  Procedure Laterality Date  . AORTIC VALVE REPLACEMENT  01/24/12  . APPENDECTOMY    . [place] or 1966 lumbar back surgery  . BENTALL PROCEDURE  01/24/2012   Procedure: BENTALL PROCEDURE;  Surgeon: Ivin Poot, MD;  Location: [facility];  Service: Open Heart Surgery;  Laterality: N/A;  . BILATERAL KNEE ARTHROSCOPY    . CATARACT EXTRACTION W/PHACO Left 12/03/2016   Procedure: CATARACT EXTRACTION PHACO AND INTRAOCULAR  LENS PLACEMENT LEFT EYE;  Surgeon: Tonny Branch, MD;  Location: [facility];  Service: Ophthalmology;  Laterality: Left;  CDE:  10.87  . CATARACT EXTRACTION W/PHACO Right 12/13/2016   Procedure: CATARACT EXTRACTION PHACO AND INTRAOCULAR LENS PLACEMENT (IOC);  Surgeon: Tonny Branch, MD;  Location: [facility];  Service: Ophthalmology;  Laterality: Right;  right cde 11.78  . CORONARY ARTERY BYPASS GRAFT  01/24/2012   Procedure: CORONARY ARTERY BYPASS GRAFTING (CABG);  Surgeon: Ivin Poot, MD;  Location: [facility];  Service: Open Heart Surgery;  Laterality: N/A;  . ESOPHAGOGASTRODUODENOSCOPY  06/17/2012   Procedure: ESOPHAGOGASTRODUODENOSCOPY (EGD);  Surgeon: Rogene Houston, MD;  [facility];  Service: Endoscopy;  Laterality: N/A;  ED  . ESOPHAGOGASTRODUODENOSCOPY N/A 05/19/2016   Procedure: ESOPHAGOGASTRODUODENOSCOPY (EGD);  Surgeon: Danie Binder, MD;  [facility];  Service: Endoscopy;  Laterality: N/A;  . HEMORROIDECTOMY    . HERNIA REPAIR    . Left inguinal hernia repair    . PACEMAKER PLACEMENT     [employer] 4/13 - Dr. Rayann Heman  . PERMANENT PACEMAKER INSERTION N/A 02/01/2012   Procedure: PERMANENT PACEMAKER INSERTION;  Surgeon: Thompson Grayer, MD; [place] 1 (serial number [id number])      Family History  Problem Relation Age of Onset  . Kidney disease Mother   . Heart disease Father   . Anesthesia problems Neg Hx   . Hypotension Neg Hx   . Malignant hyperthermia Neg Hx   . Pseudochol deficiency Neg Hx     Social History   Socioeconomic History  . Marital status: Married    Spouse name: Not on file  . Number of children: 4  . Years of education: Not on file  . Highest education level: Not on file  Social Needs  . Financial resource strain: Not on file  . Food insecurity - worry: Not on file  . Food insecurity - inability: Not on file  . Transportation needs - medical: Not on file  . Transportation needs - non-medical: Not on file  Occupational  History  . Occupation: Retired    Comment: Scientist, forensic  Tobacco Use  . Smoking status: Never Smoker  . Smokeless tobacco: Never Used  Substance and Sexual Activity  . Alcohol use: No    Alcohol/week: 0.0 oz  . Drug use: No  . Sexual activity: No    Birth control/protection: None  Other Topics Concern  . Not on file  Social History Narrative   Lives with wife, has been married since 87.      ROS:  General: Negative for anorexia, weight loss, fever, chills, fatigue, weakness. Eyes: Negative for vision changes.  ENT: Negative for hoarseness, difficulty swallowing , nasal congestion. CV: Negative for chest pain, angina, palpitations, dyspnea on exertion, peripheral  edema.  Respiratory: Negative for dyspnea at rest, dyspnea on exertion, cough, sputum, wheezing.  GI: See history of present illness. GU:  Negative for dysuria, hematuria, urinary incontinence, urinary frequency, nocturnal urination.  MS: Negative for joint pain, low back pain.  Derm: Negative for rash or itching.  Neuro: Negative for weakness, abnormal sensation, seizure, frequent headaches, memory loss, confusion.  Psych: Negative for anxiety, depression, suicidal ideation, hallucinations.  Endo: Negative for unusual weight change.  Heme: Negative for bruising or bleeding. Allergy: Negative for rash or hives.    Physical Examination:  BP 136/72   Pulse 72   Temp (!) 97.1 F (36.2 C) (Oral)   Ht 5\' 8"  (1.727 m)   Wt 203 lb 6.4 oz (92.3 kg)   BMI 30.93 kg/m    General: Well-nourished, well-developed in no acute distress.  Head: Normocephalic, atraumatic.   Eyes: Conjunctiva pink, no icterus. Mouth: Oropharyngeal mucosa moist and pink , no lesions erythema or exudate. Neck: Supple without thyromegaly, masses, or lymphadenopathy.  Lungs: Clear to auscultation bilaterally.  Heart: Regular rate and rhythm, no murmurs rubs or gallops.  Abdomen: Bowel sounds are normal, nontender, nondistended, no  hepatosplenomegaly or masses, no abdominal bruits or    hernia , no rebound or guarding.   Rectal: no abnormalities externally. Anal stenosis but able to insert digit without discomfort. Palpation internal hemorrhoids. Stool brown and heme negative.  Extremities: No lower extremity edema. No clubbing or deformities.  Neuro: Alert and oriented x 4 , grossly normal neurologically.  Skin: Warm and dry, no rash or jaundice.   Psych: Alert and cooperative, normal mood and affect.  Labs: Lab Results  Component Value Date   IRON 55 08/30/2017   TIBC 302 08/30/2017   FERRITIN 121 08/30/2017   Lab Results  Component Value Date   WBC 9.0 08/30/2017   HGB 14.1 08/30/2017   HCT 43.2 08/30/2017   MCV 100.0 08/30/2017   PLT 186 08/30/2017   Lab Results  Component Value Date   CREATININE 1.36 (H) 08/30/2017   BUN 30 (H) 08/30/2017   NA 137 08/30/2017   K 4.6 08/30/2017   CL 101 08/30/2017   CO2 27 08/30/2017   Lab Results  Component Value Date   ALT 14 (L) 10/03/2016   AST 20 10/03/2016   ALKPHOS 87 10/03/2016   BILITOT 0.3 10/03/2016    Imaging Studies: No results found.

## 2017-12-11 NOTE — Patient Instructions (Addendum)
1. Stop your stool softeners and Dulcolax. Start Amitiza 68mcg one to two times daily with food. If you find it helps constipation but not enough, call me and we will increase the dose.  2. I will discuss with Dr. Oneida Alar regarding possible colonoscopy.

## 2017-12-12 ENCOUNTER — Encounter: Payer: Self-pay | Admitting: Gastroenterology

## 2017-12-12 NOTE — Progress Notes (Signed)
cc'ed to pcp °

## 2017-12-12 NOTE — Assessment & Plan Note (Signed)
Last iron infusion in 01/2017. H/o large gastric AVM s/p ablation in 2017. Remote colonoscopy. Most recent Hgb and ferritin very good back in 08/2017. May require colonoscopy for other reason as outlined above.

## 2017-12-12 NOTE — Assessment & Plan Note (Signed)
Recent rectal pain with protrusion of hemorrhoids per patient description. Improved with Sitz baths, Prep-H. Suspect symptoms were hemorrhoid related but cannot rule out other etiology such as underlying malignancy. His last TCS was over 10 years ago. He also reports thin caliber stool which may be secondary to degree of anal stenosis. We briefly discussed hemorrhoid banding but patient not interested at this time stating he typically doesn't have a lot of significant problems with his hemorrhoids outside of recent event.   I will discuss further with Dr. Oneida Alar, patient may benefit from colonoscopy. He is on Coumadin which would need to be addressed.   In the meantime, will address his constipation. Stop current bowel regimen, start Perkins 71mcg once to twice daily with food. Samples and RX provided. Further recommendations to follow.

## 2017-12-19 ENCOUNTER — Telehealth: Payer: Self-pay

## 2017-12-19 NOTE — Telephone Encounter (Signed)
The Amitiza has been denied by the insruance. Suggestions to try: Lactulose, Miralax, Senna, Milk of magnesia, sorbitol, metamucil, bisacodyl. Please advise!

## 2017-12-20 NOTE — Progress Notes (Signed)
Dr. Oneida Alar is suggesting him to consider surgical consultation to discuss surgery for hemorrhoids. He may also need to be evaluated for anal stenosis.    As patient's primary Gastroenterologist is Dr. Laural Golden, he can consider follow up with him regarding whether or not a colonoscopy would be appropriate for his bowel concerns. I will also let Dr. Laural Golden know what has been going on.

## 2017-12-20 NOTE — Telephone Encounter (Signed)
Patient has already tried Miralax, Metamucil, Bisacodyl, Senna and is not helping. Lactulose and sorbitol are not appropriate.   Have we tried P.A. Yet?  Please see addendum for other instructions from [facility].

## 2017-12-23 ENCOUNTER — Telehealth (INDEPENDENT_AMBULATORY_CARE_PROVIDER_SITE_OTHER): Payer: Self-pay | Admitting: *Deleted

## 2017-12-23 NOTE — Progress Notes (Signed)
PT is aware. He has appt at Dr. Olevia Perches next Tuesday with his PA. He is not sure that he will go. He wants to think about the surgery and call back tomorrow and let us know what he decides.

## 2017-12-23 NOTE — Telephone Encounter (Signed)
It appears that patient is a patient of [employer].

## 2017-12-23 NOTE — Progress Notes (Signed)
LMOM to call.

## 2017-12-23 NOTE — Telephone Encounter (Signed)
Patient called and scheduled for 12/31/17, patient wants to speak to someone regarding this appointment he states he is confused on why he needs this appointment. I tried to explain to patient the note, patient did not understand. Please advise

## 2017-12-24 NOTE — Progress Notes (Signed)
PT called and said he will be seeing Dr. Laural Golden on Monday 12/30/2017. He does not want to be referred to surgeon. He will discuss all the issues with Dr. Laural Golden.  Sending FYI to Dr. Oneida Alar and Neil Crouch, PA.

## 2017-12-27 ENCOUNTER — Other Ambulatory Visit: Payer: Self-pay | Admitting: Cardiology

## 2017-12-30 ENCOUNTER — Ambulatory Visit (INDEPENDENT_AMBULATORY_CARE_PROVIDER_SITE_OTHER): Payer: Medicare Other | Admitting: *Deleted

## 2017-12-30 ENCOUNTER — Ambulatory Visit (INDEPENDENT_AMBULATORY_CARE_PROVIDER_SITE_OTHER): Payer: Medicare Other | Admitting: Internal Medicine

## 2017-12-30 ENCOUNTER — Encounter (INDEPENDENT_AMBULATORY_CARE_PROVIDER_SITE_OTHER): Payer: Self-pay | Admitting: Internal Medicine

## 2017-12-30 VITALS — BP 140/82 | HR 65 | Temp 97.6°F | Resp 18 | Ht 68.0 in | Wt 207.0 lb

## 2017-12-30 DIAGNOSIS — Z952 Presence of prosthetic heart valve: Secondary | ICD-10-CM

## 2017-12-30 DIAGNOSIS — Z5181 Encounter for therapeutic drug level monitoring: Secondary | ICD-10-CM | POA: Diagnosis not present

## 2017-12-30 DIAGNOSIS — I4891 Unspecified atrial fibrillation: Secondary | ICD-10-CM | POA: Diagnosis not present

## 2017-12-30 DIAGNOSIS — K5900 Constipation, unspecified: Secondary | ICD-10-CM

## 2017-12-30 DIAGNOSIS — K624 Stenosis of anus and rectum: Secondary | ICD-10-CM

## 2017-12-30 LAB — POCT INR: INR: 2.2

## 2017-12-30 MED ORDER — BISACODYL 10 MG RE SUPP: 10.0000 mg | RECTAL | 0 refills | Status: DC | PRN

## 2017-12-30 MED ORDER — SENNA-DOCUSATE SODIUM 8.6-50 MG PO TABS: 1.0000 | ORAL_TABLET | Freq: Two times a day (BID) | ORAL | Status: DC

## 2017-12-30 NOTE — Patient Instructions (Signed)
Continue coumadin 1 tablet daily Recheck in 4 weeks 

## 2017-12-30 NOTE — Progress Notes (Signed)
Presenting complaint;  Constipation and anal stenosis. History of hemorrhoids.  Database and subjective:  Douglas [name] is 84 year old Caucasian male who is well-known to me from previous evaluation.  Earlier this year he developed perianal discomfort and lumps.  He felt he had hemorrhoids.  Dr. Willey Blade referred him to Dr. Oneida Alar.  Patient was seen 6 weeks later by Ms. Neil Crouch PA-C.  Patient reports his hemorrhoids were gone by then.  Ms. Bobby Rumpf noted that patient has anal stenosis.  He also has a history of constipation and was given Amitiza samples and prescription.  Amities that did not help.  He did not try higher dose because it was not covered by his insurance. He has tried polyethylene glycol in the past and it has not. It was felt the patient did not need any intervention for hemorrhoids and I was asked to see him because I am his primary GI physician. Patient states he had hemorrhoid surgery back in 1965.  He has had problems off and on since then.  He also complains of constipation.  This has been a chronic symptom.  He has been taking stool softener with a laxative and when it does not work he takes OTC laxative in addition.  He has bowel movement every 2-3 days.  Stool caliber is quite thin.  He denies melena or rectal bleeding or abdominal pain.  He says he lost 50 pounds after his aortic valve replacement but he has not lost any weight over the last year or so.  He denies heartburn.  He has dysphagia with large pills only.  His last colonoscopy was in 2008 when he underwent anal dilation.  He does not feel it helped much. Patient also reports spitting up blood Wednesday and Thursday last week but none thereafter.  He denies cough shortness of breath or chest pain. He is not interested in any surgery unless absolutely needed.   Current Medications: Outpatient Encounter Medications as of 12/30/2017  Medication Sig  . acetaminophen (TYLENOL) 500 MG tablet Take 1,000 mg by mouth every 6 (six) hours  as needed for mild pain.  . furosemide (LASIX) 40 MG tablet TAKE 1 TABLET BY MOUTH ONCE DAILY.  Marland Kitchen lubiprostone (AMITIZA) 8 MCG capsule Take one capsule with food once to twice daily for constipation  . metoprolol succinate (TOPROL-XL) 25 MG 24 hr tablet TAKE (2) TABLETS BY MOUTH DAILY.  . pantoprazole (PROTONIX) 40 MG tablet TAKE ONE TABLET BY MOUTH ONCE DAILY IN THE MORNING.  Marland Kitchen polyethylene glycol powder (MIRALAX) powder Take one capful with 4-6 ounces of liquid once daily as needed for constipation.  . pravastatin (PRAVACHOL) 20 MG tablet Take 20 mg by mouth every evening.   . warfarin (COUMADIN) 5 MG tablet Take 1 tablet daily except 1/2 tablet on Sundays  . allopurinol (ZYLOPRIM) 300 MG tablet Take 450 mg by mouth daily. Take 1 1/2 daily  . [DISCONTINUED] amoxicillin (AMOXIL) 500 MG capsule    No facility-administered encounter medications on file as of 12/30/2017.    Past medical history: Past Medical History:  Diagnosis Date  . Aneurysm of thoracic aorta (HCC)    Ascending. 5.7 x 5.5cm - Bentall procedure  . Aortic stenosis    Bioprosthetic AVR  . Arthritis   . Atrial fibrillation ([place])    Documented on pacer interrogation 6/13  . Complete heart block ([place])    Status post pacemaker placement  . Coronary atherosclerosis of native coronary artery    Multivessel - LIMA to LAD, SVG to  OM, SVG to PDA  . DJD (degenerative joint disease)   . Esophageal dysmotility   . Essential hypertension, benign   . Fibromyalgia   . Gout   . H/O hiatal hernia   . History of skin cancer   . Hx of adenomatous colonic polyps   . Iron deficiency anemia due to chronic blood loss 10/03/2016  . PMR (polymyalgia rheumatica) (HCC)   . Presence of permanent cardiac pacemaker    Past Surgical History:  Procedure Laterality Date  . AORTIC VALVE REPLACEMENT  01/24/12  . APPENDECTOMY    . [place] or 1966 lumbar back surgery  . BENTALL PROCEDURE  01/24/2012   Procedure: BENTALL PROCEDURE;   Surgeon: Ivin Poot, MD;  Location: [facility];  Service: Open Heart Surgery;  Laterality: N/A;  . BILATERAL KNEE ARTHROSCOPY    . CATARACT EXTRACTION W/PHACO Left 12/03/2016   Procedure: CATARACT EXTRACTION PHACO AND INTRAOCULAR LENS PLACEMENT LEFT EYE;  Surgeon: Tonny Branch, MD;  [facility];  Service: Ophthalmology;  Laterality: Left;  CDE:  10.87  . CATARACT EXTRACTION W/PHACO Right 12/13/2016   Procedure: CATARACT EXTRACTION PHACO AND INTRAOCULAR LENS PLACEMENT (IOC);  Surgeon: Tonny Branch, MD;  Location: [facility];  Service: Ophthalmology;  Laterality: Right;  right cde 11.78  . CORONARY ARTERY BYPASS GRAFT  01/24/2012   Procedure: CORONARY ARTERY BYPASS GRAFTING (CABG);  Surgeon: Ivin Poot, MD;  Location: [facility];  Service: Open Heart Surgery;  Laterality: N/A;  . ESOPHAGOGASTRODUODENOSCOPY  06/17/2012   Procedure: ESOPHAGOGASTRODUODENOSCOPY (EGD);  Surgeon: Rogene Houston, MD;  [facility];  Service: Endoscopy;  Laterality: N/A;  ED  . ESOPHAGOGASTRODUODENOSCOPY N/A 05/19/2016   Procedure: ESOPHAGOGASTRODUODENOSCOPY (EGD);  Surgeon: Danie Binder, MD;  [facility];  Service: Endoscopy;  Laterality: N/A;  . HEMORROIDECTOMY    . HERNIA REPAIR    . Left inguinal hernia repair    . PACEMAKER PLACEMENT     Medtronic 4/13 - Dr. Rayann Heman  . PERMANENT PACEMAKER INSERTION N/A 02/01/2012   Procedure: PERMANENT PACEMAKER INSERTION;  Surgeon: Thompson Grayer, MD; Medtronic Adapta L model ADDRL 1 (serial number [id number])      Objective: Blood pressure 140/82, pulse 65, temperature 97.6 F (36.4 C), temperature source Oral, resp. rate 18, height 5\' 8"  (1.727 m), weight 207 lb (93.9 kg). Patient is alert and in no acute distress. Conjunctiva is pink. Sclera is nonicteric Oropharyngeal mucosa is normal. No neck masses or thyromegaly noted. Cardiac exam with regular rhythm normal S1 and prominent S2.  he has grade 2/6 systolic ejection murmur best heard at aortic area. Lungs  are clear to auscultation. Abdomen abdomen is symmetrical with left inguinal scar.  On palpation abdomen is soft and nontender without organomegaly or masses. Rectal examination reveals anal stenosis.  No mass or tenderness noted.  Formed but soft stool in the rectum.  Stool is guaiac negative. No LE edema or clubbing noted.  Labs/studies Results:  No results for input(s): WBC, HGB, HCT, PLT in the last 72 hours.  BMET  No results for input(s): NA, K, CL, CO2, GLUCOSE, BUN, CREATININE, CALCIUM in the last 72 hours.  LFT  No results for input(s): PROT, ALBUMIN, AST, ALT, ALKPHOS, BILITOT, BILIDIR, IBILI in the last 72 hours.  PT/INR  Recent Labs    12/30/17 0839  INR 2.2    H&H was 14.1 and 43.2 on 08/30/2017.  Assessment:  #1.  Anal stenosis.  This was discovered  several years ago and possibly the result of prior hemorrhoidectomy.  patient did not interested in surgical therapy at this time.  #2.  Chronic constipation.  Constipation needs to be effectively treated given that he has anal stenosis and he is at risk for fecal impaction.  Goal is for him to have bowel movement daily or at least every other day in order to prevent fecal impaction.   #3.  History of iron deficiency anemia most likely secondary to blood loss from gastric AVM with initial intervention in August 2013 and again in July 2017.  His stool is guaiac negative and H&H is normal.   Plan:  Request prior colonoscopy records. Patient will continue Peri-Colace 1 tablet p.o. twice daily. Increase intake of fiber rich foods as tolerated.  Patient advised to use Dulcolax suppository every other day unless he has satisfactory bowel movement. He will keep stool diary for the next 1 month and bring [place] a summary. Office visit in 6 months.

## 2017-12-30 NOTE — Patient Instructions (Signed)
Keep stool diary for 1 month and provide with summary. Use Dulcolax suppository every other day or when you feel like you need to have a bowel movement but cannot. Increase intake of fiber rich foods as discussed.

## 2017-12-31 ENCOUNTER — Ambulatory Visit (INDEPENDENT_AMBULATORY_CARE_PROVIDER_SITE_OTHER): Payer: Medicare Other | Admitting: Internal Medicine

## 2017-12-31 NOTE — Telephone Encounter (Signed)
Pt is back with Dr. Laural Golden.

## 2018-01-09 ENCOUNTER — Encounter: Payer: Self-pay | Admitting: Internal Medicine

## 2018-01-25 ENCOUNTER — Other Ambulatory Visit: Payer: Self-pay | Admitting: Cardiology

## 2018-01-27 ENCOUNTER — Ambulatory Visit (INDEPENDENT_AMBULATORY_CARE_PROVIDER_SITE_OTHER): Payer: Medicare Other | Admitting: *Deleted

## 2018-01-27 DIAGNOSIS — I4891 Unspecified atrial fibrillation: Secondary | ICD-10-CM

## 2018-01-27 DIAGNOSIS — Z952 Presence of prosthetic heart valve: Secondary | ICD-10-CM

## 2018-01-27 DIAGNOSIS — Z5181 Encounter for therapeutic drug level monitoring: Secondary | ICD-10-CM | POA: Diagnosis not present

## 2018-01-27 LAB — POCT INR: INR: 3

## 2018-01-27 NOTE — Patient Instructions (Signed)
Continue coumadin 1 tablet daily  Eat greens today Recheck in 6 weeks

## 2018-02-13 DIAGNOSIS — N183 Chronic kidney disease, stage 3 (moderate): Secondary | ICD-10-CM | POA: Diagnosis not present

## 2018-02-13 DIAGNOSIS — I10 Essential (primary) hypertension: Secondary | ICD-10-CM | POA: Diagnosis not present

## 2018-02-13 DIAGNOSIS — M109 Gout, unspecified: Secondary | ICD-10-CM | POA: Diagnosis not present

## 2018-02-13 DIAGNOSIS — I5032 Chronic diastolic (congestive) heart failure: Secondary | ICD-10-CM | POA: Diagnosis not present

## 2018-02-13 DIAGNOSIS — I480 Paroxysmal atrial fibrillation: Secondary | ICD-10-CM | POA: Diagnosis not present

## 2018-02-13 DIAGNOSIS — Z79899 Other long term (current) drug therapy: Secondary | ICD-10-CM | POA: Diagnosis not present

## 2018-02-17 DIAGNOSIS — Z85828 Personal history of other malignant neoplasm of skin: Secondary | ICD-10-CM | POA: Diagnosis not present

## 2018-02-17 DIAGNOSIS — D225 Melanocytic nevi of trunk: Secondary | ICD-10-CM | POA: Diagnosis not present

## 2018-02-17 DIAGNOSIS — D0462 Carcinoma in situ of skin of left upper limb, including shoulder: Secondary | ICD-10-CM | POA: Diagnosis not present

## 2018-02-17 DIAGNOSIS — L570 Actinic keratosis: Secondary | ICD-10-CM | POA: Diagnosis not present

## 2018-02-17 DIAGNOSIS — L821 Other seborrheic keratosis: Secondary | ICD-10-CM | POA: Diagnosis not present

## 2018-02-17 DIAGNOSIS — S30861A Insect bite (nonvenomous) of abdominal wall, initial encounter: Secondary | ICD-10-CM | POA: Diagnosis not present

## 2018-02-20 ENCOUNTER — Other Ambulatory Visit (HOSPITAL_COMMUNITY): Payer: Self-pay

## 2018-02-20 ENCOUNTER — Other Ambulatory Visit (HOSPITAL_COMMUNITY): Payer: Medicare Other

## 2018-02-20 DIAGNOSIS — N183 Chronic kidney disease, stage 3 (moderate): Secondary | ICD-10-CM | POA: Diagnosis not present

## 2018-02-20 DIAGNOSIS — I480 Paroxysmal atrial fibrillation: Secondary | ICD-10-CM | POA: Diagnosis not present

## 2018-02-20 DIAGNOSIS — Z6831 Body mass index (BMI) 31.0-31.9, adult: Secondary | ICD-10-CM | POA: Diagnosis not present

## 2018-02-20 DIAGNOSIS — I10 Essential (primary) hypertension: Secondary | ICD-10-CM | POA: Diagnosis not present

## 2018-02-20 DIAGNOSIS — D500 Iron deficiency anemia secondary to blood loss (chronic): Secondary | ICD-10-CM

## 2018-02-20 DIAGNOSIS — I700 Atherosclerosis of aorta: Secondary | ICD-10-CM | POA: Diagnosis not present

## 2018-02-21 ENCOUNTER — Inpatient Hospital Stay (HOSPITAL_COMMUNITY): Payer: Medicare Other | Attending: Hematology

## 2018-02-21 DIAGNOSIS — D500 Iron deficiency anemia secondary to blood loss (chronic): Secondary | ICD-10-CM | POA: Diagnosis not present

## 2018-02-21 LAB — CBC WITH DIFFERENTIAL/PLATELET
BASOS ABS: 0 10*3/uL (ref 0.0–0.1)
BASOS PCT: 0 %
EOS ABS: 0.3 10*3/uL (ref 0.0–0.7)
Eosinophils Relative: 5 %
HCT: 42.1 % (ref 39.0–52.0)
Hemoglobin: 13.3 g/dL (ref 13.0–17.0)
Lymphocytes Relative: 14 %
Lymphs Abs: 1 10*3/uL (ref 0.7–4.0)
MCH: 31 pg (ref 26.0–34.0)
MCHC: 31.6 g/dL (ref 30.0–36.0)
MCV: 98.1 fL (ref 78.0–100.0)
MONO ABS: 0.4 10*3/uL (ref 0.1–1.0)
Monocytes Relative: 6 %
NEUTROS PCT: 75 %
Neutro Abs: 5.3 10*3/uL (ref 1.7–7.7)
Platelets: 170 10*3/uL (ref 150–400)
RBC: 4.29 MIL/uL (ref 4.22–5.81)
RDW: 14.5 % (ref 11.5–15.5)
WBC: 7.1 10*3/uL (ref 4.0–10.5)

## 2018-02-21 LAB — IRON AND TIBC
IRON: 53 ug/dL (ref 45–182)
SATURATION RATIOS: 17 % — AB (ref 17.9–39.5)
TIBC: 312 ug/dL (ref 250–450)
UIBC: 259 ug/dL

## 2018-02-26 ENCOUNTER — Ambulatory Visit (HOSPITAL_COMMUNITY): Payer: Medicare Other | Admitting: Internal Medicine

## 2018-02-26 ENCOUNTER — Other Ambulatory Visit: Payer: Self-pay | Admitting: Cardiology

## 2018-02-27 ENCOUNTER — Ambulatory Visit (HOSPITAL_COMMUNITY): Payer: Medicare Other

## 2018-03-10 ENCOUNTER — Ambulatory Visit (INDEPENDENT_AMBULATORY_CARE_PROVIDER_SITE_OTHER): Payer: Medicare Other | Admitting: *Deleted

## 2018-03-10 DIAGNOSIS — Z5181 Encounter for therapeutic drug level monitoring: Secondary | ICD-10-CM | POA: Diagnosis not present

## 2018-03-10 DIAGNOSIS — Z952 Presence of prosthetic heart valve: Secondary | ICD-10-CM

## 2018-03-10 DIAGNOSIS — I4891 Unspecified atrial fibrillation: Secondary | ICD-10-CM | POA: Diagnosis not present

## 2018-03-10 LAB — POCT INR: INR: 2.8

## 2018-03-10 NOTE — Patient Instructions (Signed)
Continue coumadin 1 tablet daily  Continue greens Recheck in 6 weeks 

## 2018-03-28 ENCOUNTER — Other Ambulatory Visit: Payer: Self-pay | Admitting: Cardiology

## 2018-03-31 NOTE — Progress Notes (Signed)
Cardiology Office Note  Date: 04/01/2018   ID: Douglas [name], DOB [birth year], MRN [id number]  PCP: [name], MD  Primary Cardiologist: Rozann Lesches, MD   Chief Complaint  Patient presents with  . Coronary Artery Disease    History of Present Illness: Douglas [name] is an 84 y.o. male that I have not seen in the office since 2017.  He presents for a follow-up visit.  States that he has been doing well overall, saw Dr. Willey Blade for a physical back in April, I reviewed his lab work.  He still enjoys being outdoors and working on his property.  He does not report any angina symptoms or nitroglycerin use.  He is on Coumadin with follow-up in the anticoagulation clinic.  Denies any spontaneous bleeding problems.  He follows in the device clinic with Dr. Lovena Le, Medtronic pacemaker in place for treatment of complete heart block.  I personally reviewed his ECG today which shows a dual-chamber paced rhythm.  Current regimen includes Pravachol with recent LDL 86.  Follow-up echocardiogram from 2017 is reviewed below.  Past Medical History:  Diagnosis Date  . Acute upper GI bleed 05/18/2016  . Aneurysm of thoracic aorta (HCC)    Ascending. 5.7 x 5.5cm - Bentall procedure  . Aortic stenosis    Bioprosthetic AVR  . Aortic valve replaced 08/25/2012  . Arthritis   . Atrial fibrillation ([place])    Documented on pacer interrogation 6/13  . CHB (complete heart block) ([place]) 01/31/2012  . Complete heart block ([place])    Status post pacemaker placement  . Coronary atherosclerosis of native coronary artery    Multivessel - LIMA to LAD, SVG to OM, SVG to PDA  . DJD (degenerative joint disease)   . Esophageal dysmotility   . Essential hypertension, benign   . Fibromyalgia   . Gout   . H/O arteriovenous malformation (AVM)   . H/O hiatal hernia   . Hemorrhoid 12/11/2017  . History of esophageal ulcer   . History of skin cancer   . Hx of adenomatous colonic polyps   . Iron deficiency anemia due  to chronic blood loss 10/03/2016  . Melena   . Pacemaker-Medtronic 07/29/2012  . PMR (polymyalgia rheumatica) (HCC)   . Precordial pain 06/15/2014  . Presence of permanent cardiac pacemaker   . SIRS (systemic inflammatory response syndrome) ([place]) 01/16/2015    Past Surgical History:  Procedure Laterality Date  . AORTIC VALVE REPLACEMENT  01/24/12  . APPENDECTOMY    . [place] or 1966 lumbar back surgery  . BENTALL PROCEDURE  01/24/2012   Procedure: BENTALL PROCEDURE;  Surgeon: Ivin Poot, MD;  Location: [facility];  Service: Open Heart Surgery;  Laterality: N/A;  . BILATERAL KNEE ARTHROSCOPY    . CATARACT EXTRACTION W/PHACO Left 12/03/2016   Procedure: CATARACT EXTRACTION PHACO AND INTRAOCULAR LENS PLACEMENT LEFT EYE;  Surgeon: Tonny Branch, MD;  [facility];  Service: Ophthalmology;  Laterality: Left;  CDE:  10.87  . CATARACT EXTRACTION W/PHACO Right 12/13/2016   Procedure: CATARACT EXTRACTION PHACO AND INTRAOCULAR LENS PLACEMENT (IOC);  Surgeon: Tonny Branch, MD;  [facility];  Service: Ophthalmology;  Laterality: Right;  right cde 11.78  . CORONARY ARTERY BYPASS GRAFT  01/24/2012   Procedure: CORONARY ARTERY BYPASS GRAFTING (CABG);  Surgeon: Ivin Poot, MD;  Location: [facility];  Service: Open Heart Surgery;  Laterality: N/A;  . ESOPHAGOGASTRODUODENOSCOPY  06/17/2012   Procedure: ESOPHAGOGASTRODUODENOSCOPY (EGD);  Surgeon: Bernadene Person  Gloriann Loan, MD;  [facility];  Service: Endoscopy;  Laterality: N/A;  ED  . ESOPHAGOGASTRODUODENOSCOPY N/A 05/19/2016   Procedure: ESOPHAGOGASTRODUODENOSCOPY (EGD);  Surgeon: Danie Binder, MD;  [facility];  Service: Endoscopy;  Laterality: N/A;  . HEMORROIDECTOMY    . HERNIA REPAIR    . Left inguinal hernia repair    . PACEMAKER PLACEMENT     Medtronic 4/13 - Dr. Rayann Heman  . PERMANENT PACEMAKER INSERTION N/A 02/01/2012   Procedure: PERMANENT PACEMAKER INSERTION;  Surgeon: Thompson Grayer, MD; [place] 1  (serial number [id number])      Current Outpatient Medications  Medication Sig Dispense Refill  . acetaminophen (TYLENOL) 500 MG tablet Take 1,000 mg by mouth every 6 (six) hours as needed for mild pain.    Marland Kitchen allopurinol (ZYLOPRIM) 300 MG tablet Take 450 mg by mouth daily. Take 1 1/2 daily    . bisacodyl (DULCOLAX) 10 MG suppository Place 1 suppository (10 mg total) rectally as needed for moderate constipation. 12 suppository 0  . furosemide (LASIX) 40 MG tablet TAKE 1 TABLET BY MOUTH ONCE DAILY. 30 tablet 3  . metoprolol succinate (TOPROL-XL) 25 MG 24 hr tablet TAKE (2) TABLETS BY MOUTH DAILY. 60 tablet 9  . pantoprazole (PROTONIX) 40 MG tablet TAKE ONE TABLET BY MOUTH ONCE DAILY IN THE MORNING. 30 tablet 6  . pravastatin (PRAVACHOL) 20 MG tablet Take 20 mg by mouth every evening.   11  . sennosides-docusate sodium (SENOKOT-S) 8.6-50 MG tablet Take 1 tablet by mouth 2 (two) times daily.    Marland Kitchen warfarin (COUMADIN) 5 MG tablet TAKE 1 TABLET BY MOUTH ONCE DAILY, EXCEPT TAKE 1/2 TABLET ON SUNDAYS. 90 tablet 0   No current facility-administered medications for this visit.    Allergies:  Imdur [isosorbide nitrate]   Social History: The patient  reports that he has never smoked. He has never used smokeless tobacco. He reports that he does not drink alcohol or use drugs.   ROS:  Please see the history of present illness. Otherwise, complete review of systems is positive for hearing loss.  All other systems are reviewed and negative.   Physical Exam: VS:  BP (!) 144/86   Pulse 70   Ht 5\' 6"  (1.676 m)   Wt 207 lb (93.9 kg)   SpO2 97%   BMI 33.41 kg/m , BMI Body mass index is 33.41 kg/m.  Wt Readings from Last 3 Encounters:  04/01/18 207 lb (93.9 kg)  12/30/17 207 lb (93.9 kg)  12/11/17 203 lb 6.4 oz (92.3 kg)    General: Elderly male, appears comfortable at rest. HEENT: Conjunctiva and lids normal, oropharynx clear. Neck: Supple, no elevated JVP or carotid bruits, no  thyromegaly. Lungs: Clear to auscultation, nonlabored breathing at rest. Cardiac: Regular rate and rhythm, no S3, 2/6 systolic murmur. Abdomen: Soft, nontender, bowel sounds present. Extremities: Mild lower leg and ankle edema, distal pulses 2+. Skin: Warm and dry. Musculoskeletal: No kyphosis. Neuropsychiatric: Alert and oriented x3, affect grossly appropriate.  ECG: I personally reviewed the tracing from 11/28/2016 which showed a dual chamber paced rhythm.  Recent Labwork: 08/30/2017: BUN 30; Creatinine, Ser 1.36; Potassium 4.6; Sodium 137 02/21/2018: Hemoglobin 13.3; Platelets 170  April 2019: Hemoglobin 13.1, platelets 181, cholesterol 147, triglycerides 132, HDL 35, LDL 86, BUN 24, creatinine 1.34, potassium 4.4, AST 19, ALT 11  Other Studies Reviewed Today:  Echocardiogram 11/10/2015: Study Conclusions  - Left ventricle: The cavity size was normal. Wall thickness  was   increased in a pattern of moderate LVH. Systolic function was   normal. The estimated ejection fraction was in the range of 55%   to 60%. Wall motion was normal; there were no regional wall   motion abnormalities. Features are consistent with a pseudonormal   left ventricular filling pattern, with concomitant abnormal   relaxation and increased filling pressure (grade 2 diastolic   dysfunction). Doppler parameters are consistent with high   ventricular filling pressure. - Aortic valve: History of Bental procedure with 23 mm Edwards   pericardial valve along with 57mm Gelweave Valsalva ascending   aorta graft. Leaflets poorly visualized, there does appear to be   decreased leaflet mobility however there is no significant   gradient across the valve. Mildly calcified annulus. Mildly   thickened leaflets. Mean gradient (S): 5 mm Hg. Peak gradient   (S): 10 mm Hg. VTI ratio of LVOT to aortic valve: 0.76. Valve   area (VTI): 1.94 cm^2. Valve area (Vmax): 1.89 cm^2. Valve area   (Vmean): 1.96 cm^2. - Aorta: Patient  with history of Bentall procedure with 28 mm   Gelweave Valsalva graft. - Aortic root: The aortic root was normal in size. - Mitral valve: Mildly calcified annulus. Mildly thickened leaflets   . There was mild regurgitation. - Right atrium: The atrium was mildly dilated. - Atrial septum: No defect or patent foramen ovale was identified. - Technically difficult study. Echocontrast was used to enhance   visualization.  Assessment and Plan:  1.  Multivessel CAD status post CABG in 2013.  We will continue with observation in the absence of significant angina symptoms on medical therapy.  No changes made in present regimen except cut Lasix back to 20 mg daily.  2.  Ascending thoracic aortic aneurysm status post Bentall procedure in 2013.  Echocardiogram from 2017 showed stable aortic prosthesis with normal function.  3.  Paroxysmal atrial fibrillation based on pacemaker interrogation.  He does not report any palpitations.  He is on Coumadin for stroke prophylaxis with follow-up in the anticoagulation clinic.  4.  Complete heart block status post Medtronic pacemaker.  He follows with Dr. Lovena Le.  ECG shows dual-chamber pacing.  5.  Mixed hyperlipidemia on Pravachol.  Recent LDL 86.  Current medicines were reviewed with the patient today.   Orders Placed This Encounter  Procedures  . EKG 12-Lead    Disposition: Follow-up in 1 year.  Signed, Satira Sark, MD, [facility] 04/01/2018 10:33 AM    [facility]. [address] [place], [place] [address] Phone: [phone]; Fax: [phone]

## 2018-04-01 ENCOUNTER — Ambulatory Visit (INDEPENDENT_AMBULATORY_CARE_PROVIDER_SITE_OTHER): Payer: Medicare Other | Admitting: Cardiology

## 2018-04-01 ENCOUNTER — Encounter: Payer: Self-pay | Admitting: Cardiology

## 2018-04-01 ENCOUNTER — Ambulatory Visit: Payer: Medicare Other | Admitting: Cardiology

## 2018-04-01 VITALS — BP 138/70 | HR 70 | Ht 66.0 in | Wt 207.0 lb

## 2018-04-01 DIAGNOSIS — Z953 Presence of xenogenic heart valve: Secondary | ICD-10-CM

## 2018-04-01 DIAGNOSIS — I442 Atrioventricular block, complete: Secondary | ICD-10-CM

## 2018-04-01 DIAGNOSIS — I25119 Atherosclerotic heart disease of native coronary artery with unspecified angina pectoris: Secondary | ICD-10-CM

## 2018-04-01 DIAGNOSIS — E782 Mixed hyperlipidemia: Secondary | ICD-10-CM

## 2018-04-01 DIAGNOSIS — I480 Paroxysmal atrial fibrillation: Secondary | ICD-10-CM | POA: Diagnosis not present

## 2018-04-01 NOTE — Patient Instructions (Signed)
Your physician wants you to follow-up in: 1 year with Dr.McDowell You will receive a reminder letter in the mail two months in advance. If you don't receive a letter, please call our office to schedule the follow-up appointment.    Your physician recommends that you continue on your current medications as directed. Please refer to the Current Medication list given to you today.    If you need a refill on your cardiac medications before your next appointment, please call your pharmacy.     No lab work or tests ordered today.      Thank you for choosing [facility] !        

## 2018-04-09 ENCOUNTER — Encounter: Payer: Medicare Other | Admitting: Internal Medicine

## 2018-04-11 ENCOUNTER — Encounter: Payer: Self-pay | Admitting: Internal Medicine

## 2018-04-11 ENCOUNTER — Ambulatory Visit (INDEPENDENT_AMBULATORY_CARE_PROVIDER_SITE_OTHER): Payer: Medicare Other | Admitting: Internal Medicine

## 2018-04-11 DIAGNOSIS — I25119 Atherosclerotic heart disease of native coronary artery with unspecified angina pectoris: Secondary | ICD-10-CM

## 2018-04-11 DIAGNOSIS — I442 Atrioventricular block, complete: Secondary | ICD-10-CM | POA: Diagnosis not present

## 2018-04-11 LAB — CUP PACEART INCLINIC DEVICE CHECK
Battery Remaining Longevity: 74 mo
Battery Voltage: 2.77 V
Brady Statistic AP VP Percent: 60 %
Brady Statistic AS VP Percent: 40 %
Brady Statistic AS VS Percent: 0 %
Date Time Interrogation Session: 20190621094003
Implantable Lead Implant Date: 20130412
Implantable Lead Location: 753859
Implantable Lead Model: 5076
Implantable Pulse Generator Implant Date: 20130412
Lead Channel Impedance Value: 472 Ohm
Lead Channel Pacing Threshold Amplitude: 0.5 V
Lead Channel Pacing Threshold Amplitude: 0.625 V
Lead Channel Pacing Threshold Pulse Width: 0.4 ms
Lead Channel Setting Pacing Amplitude: 2 V
Lead Channel Setting Pacing Amplitude: 2 V
Lead Channel Setting Pacing Pulse Width: 0.4 ms
Lead Channel Setting Sensing Sensitivity: 4 mV
MDC IDC LEAD IMPLANT DT: 20130412
MDC IDC LEAD LOCATION: 753860
MDC IDC MSMT BATTERY IMPEDANCE: 678 Ohm
MDC IDC MSMT LEADCHNL RA PACING THRESHOLD PULSEWIDTH: 0.4 ms
MDC IDC MSMT LEADCHNL RA SENSING INTR AMPL: 1.4 mV
MDC IDC MSMT LEADCHNL RV IMPEDANCE VALUE: 552 Ohm
MDC IDC MSMT LEADCHNL RV PACING THRESHOLD AMPLITUDE: 0.5 V
MDC IDC MSMT LEADCHNL RV PACING THRESHOLD PULSEWIDTH: 0.4 ms
MDC IDC STAT BRADY AP VS PERCENT: 0 %

## 2018-04-11 MED ORDER — NITROGLYCERIN 0.4 MG SL SUBL: 0.4000 mg | SUBLINGUAL_TABLET | SUBLINGUAL | 3 refills | Status: DC | PRN

## 2018-04-11 NOTE — Patient Instructions (Addendum)
Medication Instructions:  Your physician has recommended you make the following change in your medication: 1.  Nitro 0.4 mg tab by mouth- take one tablet as needed by mouth for chest pain.    Labwork: None ordered.  Testing/Procedures: None ordered.  Follow-Up:  Your physician wants you to follow-up in: one year with Dr. Lovena Le.   You will receive a reminder letter in the mail two months in advance. If you don't receive a letter, please call our office to schedule the follow-up appointment.  Remote monitoring is used to monitor your Pacemaker from home. This monitoring reduces the number of office visits required to check your device to one time per year. It allows [place] to keep an eye on the functioning of your device to ensure it is working properly. You are scheduled for a device check from home on 07/14/2018. You may send your transmission at any time that day. If you have a wireless device, the transmission will be sent automatically. After your physician reviews your transmission, you will receive a postcard with your next transmission date.   Any Other Special Instructions Will Be Listed Below (If Applicable).  If you need a refill on your cardiac medications before your next appointment, please call your pharmacy.   Nitroglycerin sublingual tablets What is this medicine? NITROGLYCERIN (nye troe GLI ser in) is a type of vasodilator. It relaxes blood vessels, increasing the blood and oxygen supply to your heart. This medicine is used to relieve chest pain caused by angina. It is also used to prevent chest pain before activities like climbing stairs, going outdoors in cold weather, or sexual activity. This medicine may be used for other purposes; ask your health care provider or pharmacist if you have questions. COMMON BRAND NAME(S): Nitroquick, Nitrostat, Nitrotab What should I tell my health care provider before I take this medicine? They need to know if you have any of these  conditions: -anemia -head injury, recent stroke, or bleeding in the brain -liver disease -previous heart attack -an unusual or allergic reaction to nitroglycerin, other medicines, foods, dyes, or preservatives -pregnant or trying to get pregnant -breast-feeding How should I use this medicine? Take this medicine by mouth as needed. At the first sign of an angina attack (chest pain or tightness) place one tablet under your tongue. You can also take this medicine 5 to 10 minutes before an event likely to produce chest pain. Follow the directions on the prescription label. Let the tablet dissolve under the tongue. Do not swallow whole. Replace the dose if you accidentally swallow it. It will help if your mouth is not dry. Saliva around the tablet will help it to dissolve more quickly. Do not eat or drink, smoke or chew tobacco while a tablet is dissolving. If you are not better within 5 minutes after taking ONE dose of nitroglycerin, call 9-1-1 immediately to seek emergency medical care. Do not take more than 3 nitroglycerin tablets over 15 minutes. If you take this medicine often to relieve symptoms of angina, your doctor or health care professional may provide you with different instructions to manage your symptoms. If symptoms do not go away after following these instructions, it is important to call 9-1-1 immediately. Do not take more than 3 nitroglycerin tablets over 15 minutes. Talk to your pediatrician regarding the use of this medicine in children. Special care may be needed. Overdosage: If you think you have taken too much of this medicine contact a poison control center or emergency room at  once. NOTE: This medicine is only for you. Do not share this medicine with others. What if I miss a dose? This does not apply. This medicine is only used as needed. What may interact with this medicine? Do not take this medicine with any of the following medications: -certain migraine medicines like  ergotamine and dihydroergotamine (DHE) -medicines used to treat erectile dysfunction like sildenafil, tadalafil, and vardenafil -riociguat This medicine may also interact with the following medications: -alteplase -aspirin -heparin -medicines for high blood pressure -medicines for mental depression -other medicines used to treat angina -phenothiazines like chlorpromazine, mesoridazine, prochlorperazine, thioridazine This list may not describe all possible interactions. Give your health care provider a list of all the medicines, herbs, non-prescription drugs, or dietary supplements you use. Also tell them if you smoke, drink alcohol, or use illegal drugs. Some items may interact with your medicine. What should I watch for while using this medicine? Tell your doctor or health care professional if you feel your medicine is no longer working. Keep this medicine with you at all times. Sit or lie down when you take your medicine to prevent falling if you feel dizzy or faint after using it. Try to remain calm. This will help you to feel better faster. If you feel dizzy, take several deep breaths and lie down with your feet propped up, or bend forward with your head resting between your knees. You may get drowsy or dizzy. Do not drive, use machinery, or do anything that needs mental alertness until you know how this drug affects you. Do not stand or sit up quickly, especially if you are an older patient. This reduces the risk of dizzy or fainting spells. Alcohol can make you more drowsy and dizzy. Avoid alcoholic drinks. Do not treat yourself for coughs, colds, or pain while you are taking this medicine without asking your doctor or health care professional for advice. Some ingredients may increase your blood pressure. What side effects may I notice from receiving this medicine? Side effects that you should report to your doctor or health care professional as soon as possible: -blurred vision -dry  mouth -skin rash -sweating -the feeling of extreme pressure in the head -unusually weak or tired Side effects that usually do not require medical attention (report to your doctor or health care professional if they continue or are bothersome): -flushing of the face or neck -headache -irregular heartbeat, palpitations -nausea, vomiting This list may not describe all possible side effects. Call your doctor for medical advice about side effects. You may report side effects to FDA at 1-800-FDA-1088. Where should I keep my medicine? Keep out of the reach of children. Store at room temperature between 20 and 25 degrees C (68 and 77 degrees F). Store in [facility] of [employer]. Protect from light and moisture. Keep tightly closed. Throw away any unused medicine after the expiration date. NOTE: This sheet is a summary. It may not cover all possible information. If you have questions about this medicine, talk to your doctor, pharmacist, or health care provider.  2018 Elsevier/Gold Standard (2013-08-06 17:57:36)

## 2018-04-11 NOTE — Progress Notes (Signed)
HPI [name] returns today for followup. He is a pleasant 84 yo man with a h/o CHB, after AVR/Bentall with a tissue valve, s/p PPM, HTN and chronic coumadin therapy. In the interim, he has done well mostly. He noted a bleeding ulcer back in July which has been stable. No sob. Minimal edema. He notes some chest pressure when he bends over. However has not noticed chest pressure when he walks although he is sedentary.   Allergies  Allergen Reactions  . Imdur [Isosorbide Nitrate] Other (See Comments)    Nausea and lightheaded      Current Outpatient Medications  Medication Sig Dispense Refill  . acetaminophen (TYLENOL) 500 MG tablet Take 1,000 mg by mouth every 6 (six) hours as needed for mild pain.    Marland Kitchen allopurinol (ZYLOPRIM) 300 MG tablet Take 450 mg by mouth daily. Take 1 1/2 daily    . bisacodyl (DULCOLAX) 10 MG suppository Place 1 suppository (10 mg total) rectally as needed for moderate constipation. 12 suppository 0  . furosemide (LASIX) 40 MG tablet TAKE 1 TABLET BY MOUTH ONCE DAILY. 30 tablet 3  . metoprolol succinate (TOPROL-XL) 25 MG 24 hr tablet TAKE (2) TABLETS BY MOUTH DAILY. 60 tablet 9  . pantoprazole (PROTONIX) 40 MG tablet TAKE ONE TABLET BY MOUTH ONCE DAILY IN THE MORNING. 30 tablet 6  . pravastatin (PRAVACHOL) 20 MG tablet Take 20 mg by mouth every evening.   11  . sennosides-docusate sodium (SENOKOT-S) 8.6-50 MG tablet Take 1 tablet by mouth 2 (two) times daily.    Marland Kitchen warfarin (COUMADIN) 5 MG tablet TAKE 1 TABLET BY MOUTH ONCE DAILY, EXCEPT TAKE 1/2 TABLET ON SUNDAYS. 90 tablet 0   No current facility-administered medications for this visit.      Past Medical History:  Diagnosis Date  . Acute upper GI bleed 05/18/2016  . Aneurysm of thoracic aorta (HCC)    Ascending. 5.7 x 5.5cm - Bentall procedure  . Aortic stenosis    Bioprosthetic AVR  . Aortic valve replaced 08/25/2012  . Arthritis   . Atrial fibrillation (HCC)    Documented on pacer interrogation  6/13  . CHB (complete heart block) (HCC) 01/31/2012  . Complete heart block (HCC)    Status post pacemaker placement  . Coronary atherosclerosis of native coronary artery    Multivessel - LIMA to LAD, SVG to OM, SVG to PDA  . DJD (degenerative joint disease)   . Esophageal dysmotility   . Essential hypertension, benign   . Fibromyalgia   . Gout   . H/O arteriovenous malformation (AVM)   . H/O hiatal hernia   . Hemorrhoid 12/11/2017  . History of esophageal ulcer   . History of skin cancer   . Hx of adenomatous colonic polyps   . Iron deficiency anemia due to chronic blood loss 10/03/2016  . Melena   . Pacemaker-Medtronic 07/29/2012  . PMR (polymyalgia rheumatica) (HCC)   . Precordial pain 06/15/2014  . Presence of permanent cardiac pacemaker   . SIRS (systemic inflammatory response syndrome) (HCC) 01/16/2015    ROS:   All systems reviewed and negative except as noted in the HPI.   Past Surgical History:  Procedure Laterality Date  . AORTIC VALVE REPLACEMENT  01/24/12  . APPENDECTOMY    . BACK SURGERY     19 67 or 1966 lumbar back surgery  . BENTALL PROCEDURE  01/24/2012   Procedure: BENTALL PROCEDURE;  Surgeon: Ivin Poot, MD;  Location: [facility];  Service: Open Heart Surgery;  Laterality: N/A;  . BILATERAL KNEE ARTHROSCOPY    . CATARACT EXTRACTION W/PHACO Left 12/03/2016   Procedure: CATARACT EXTRACTION PHACO AND INTRAOCULAR LENS PLACEMENT LEFT EYE;  Surgeon: Tonny Branch, MD;  Location: [facility];  Service: Ophthalmology;  Laterality: Left;  CDE:  10.87  . CATARACT EXTRACTION W/PHACO Right 12/13/2016   Procedure: CATARACT EXTRACTION PHACO AND INTRAOCULAR LENS PLACEMENT (IOC);  Surgeon: Tonny Branch, MD;  Location: [facility];  Service: Ophthalmology;  Laterality: Right;  right cde 11.78  . CORONARY ARTERY BYPASS GRAFT  01/24/2012   Procedure: CORONARY ARTERY BYPASS GRAFTING (CABG);  Surgeon: Ivin Poot, MD;  Location: [facility];  Service: Open Heart Surgery;  Laterality: N/A;  .  ESOPHAGOGASTRODUODENOSCOPY  06/17/2012   Procedure: ESOPHAGOGASTRODUODENOSCOPY (EGD);  Surgeon: Rogene Houston, MD;  [facility];  Service: Endoscopy;  Laterality: N/A;  ED  . ESOPHAGOGASTRODUODENOSCOPY N/A 05/19/2016   Procedure: ESOPHAGOGASTRODUODENOSCOPY (EGD);  Surgeon: Danie Binder, MD;  [facility];  Service: Endoscopy;  Laterality: N/A;  . HEMORROIDECTOMY    . HERNIA REPAIR    . Left inguinal hernia repair    . PACEMAKER PLACEMENT     Medtronic 4/13 - Dr. Rayann Heman  . PERMANENT PACEMAKER INSERTION N/A 02/01/2012   Procedure: PERMANENT PACEMAKER INSERTION;  Surgeon: Thompson Grayer, MD; [place] 1 (serial number [id number])       Family History  Problem Relation Age of Onset  . Kidney disease Mother   . Heart disease Father   . Anesthesia problems Neg Hx   . Hypotension Neg Hx   . Malignant hyperthermia Neg Hx   . Pseudochol deficiency Neg Hx      Social History   Socioeconomic History  . Marital status: Married    Spouse name: Not on file  . Number of children: 4  . Years of education: Not on file  . Highest education level: Not on file  Occupational History  . Occupation: Retired    Comment: Scientist, forensic  Social Needs  . Financial resource strain: Not on file  . Food insecurity:    Worry: Not on file    Inability: Not on file  . Transportation needs:    Medical: Not on file    Non-medical: Not on file  Tobacco Use  . Smoking status: Never Smoker  . Smokeless tobacco: Never Used  Substance and Sexual Activity  . Alcohol use: No    Alcohol/week: 0.0 oz  . Drug use: No  . Sexual activity: Never    Birth control/protection: None  Lifestyle  . Physical activity:    Days per week: Not on file    Minutes per session: Not on file  . Stress: Not on file  Relationships  . Social connections:    Talks on phone: Not on file    Gets together: Not on file    Attends religious service: Not on file    Active member of  club or organization: Not on file    Attends meetings of clubs or organizations: Not on file    Relationship status: Not on file  . Intimate partner violence:    Fear of current or ex partner: Not on file    Emotionally abused: Not on file    Physically abused: Not on file    Forced sexual activity: Not on file  Other Topics Concern  . Not on file  Social History Narrative   Lives with wife,  has been married since 57.     BP 132/62   Pulse 73   Ht 5\' 6"  (1.676 m)   Wt 206 lb (93.4 kg)   SpO2 98%   BMI 33.25 kg/m   Physical Exam:  Well appearing NAD HEENT: Unremarkable Neck:  No JVD, no thyromegally Lymphatics:  No adenopathy Back:  No CVA tenderness Lungs:  Clear with no wheezes HEART:  Regular rate rhythm, no murmurs, no rubs, no clicks Abd:  soft, positive bowel sounds, no organomegally, no rebound, no guarding Ext:  2 plus pulses, no edema, no cyanosis, no clubbing Skin:  No rashes no nodules Neuro:  CN II through XII intact, motor grossly intact  DEVICE  Normal device function.  See PaceArt for details.   Assess/Plan: 1. CHB - he is stable s/p PPM  2. PPM - his Medtronic DDD PM is working normally. Will recheck in several months. 3. Atypical chest pain - his symptoms are likely GI in nature. I have asked him to walk regularly and I have given him a prescription for sub lingual ntg. If he has anginal symptoms he is encouraged to call us.  4. PAF - interrogation of his PPM demonstrates a single episode of atrial fib with a controlled VR.   Mikle Bosworth.D.

## 2018-04-13 ENCOUNTER — Encounter: Payer: Self-pay | Admitting: Internal Medicine

## 2018-04-15 ENCOUNTER — Ambulatory Visit (INDEPENDENT_AMBULATORY_CARE_PROVIDER_SITE_OTHER): Payer: Medicare Other | Admitting: Internal Medicine

## 2018-04-21 ENCOUNTER — Ambulatory Visit (INDEPENDENT_AMBULATORY_CARE_PROVIDER_SITE_OTHER): Payer: Medicare Other | Admitting: *Deleted

## 2018-04-21 DIAGNOSIS — I4891 Unspecified atrial fibrillation: Secondary | ICD-10-CM | POA: Diagnosis not present

## 2018-04-21 DIAGNOSIS — Z952 Presence of prosthetic heart valve: Secondary | ICD-10-CM | POA: Diagnosis not present

## 2018-04-21 DIAGNOSIS — Z5181 Encounter for therapeutic drug level monitoring: Secondary | ICD-10-CM | POA: Diagnosis not present

## 2018-04-21 LAB — POCT INR: INR: 2.3 (ref 2.0–3.0)

## 2018-04-21 NOTE — Patient Instructions (Signed)
Continue coumadin 1 tablet daily  Continue greens Recheck in 6 weeks 

## 2018-05-22 ENCOUNTER — Other Ambulatory Visit: Payer: Self-pay | Admitting: Internal Medicine

## 2018-05-30 DIAGNOSIS — N183 Chronic kidney disease, stage 3 (moderate): Secondary | ICD-10-CM | POA: Diagnosis not present

## 2018-05-30 DIAGNOSIS — I5032 Chronic diastolic (congestive) heart failure: Secondary | ICD-10-CM | POA: Diagnosis not present

## 2018-05-30 DIAGNOSIS — Z79899 Other long term (current) drug therapy: Secondary | ICD-10-CM | POA: Diagnosis not present

## 2018-06-02 ENCOUNTER — Ambulatory Visit (INDEPENDENT_AMBULATORY_CARE_PROVIDER_SITE_OTHER): Payer: Medicare Other | Admitting: *Deleted

## 2018-06-02 DIAGNOSIS — I4891 Unspecified atrial fibrillation: Secondary | ICD-10-CM

## 2018-06-02 DIAGNOSIS — Z5181 Encounter for therapeutic drug level monitoring: Secondary | ICD-10-CM | POA: Diagnosis not present

## 2018-06-02 DIAGNOSIS — Z952 Presence of prosthetic heart valve: Secondary | ICD-10-CM

## 2018-06-02 LAB — POCT INR: INR: 2.6 (ref 2.0–3.0)

## 2018-06-02 NOTE — Patient Instructions (Signed)
Continue coumadin 1 tablet daily  Continue greens Recheck in 6 weeks 

## 2018-06-03 ENCOUNTER — Ambulatory Visit (INDEPENDENT_AMBULATORY_CARE_PROVIDER_SITE_OTHER): Payer: Medicare Other | Admitting: Internal Medicine

## 2018-06-06 DIAGNOSIS — I480 Paroxysmal atrial fibrillation: Secondary | ICD-10-CM | POA: Diagnosis not present

## 2018-06-06 DIAGNOSIS — I5032 Chronic diastolic (congestive) heart failure: Secondary | ICD-10-CM | POA: Diagnosis not present

## 2018-06-06 DIAGNOSIS — N183 Chronic kidney disease, stage 3 (moderate): Secondary | ICD-10-CM | POA: Diagnosis not present

## 2018-06-20 DIAGNOSIS — M25561 Pain in right knee: Secondary | ICD-10-CM | POA: Diagnosis not present

## 2018-06-20 DIAGNOSIS — M1711 Unilateral primary osteoarthritis, right knee: Secondary | ICD-10-CM | POA: Insufficient documentation

## 2018-06-26 ENCOUNTER — Other Ambulatory Visit: Payer: Self-pay | Admitting: Cardiology

## 2018-07-14 ENCOUNTER — Telehealth: Payer: Self-pay | Admitting: Cardiology

## 2018-07-14 ENCOUNTER — Ambulatory Visit (INDEPENDENT_AMBULATORY_CARE_PROVIDER_SITE_OTHER): Payer: Medicare Other | Admitting: *Deleted

## 2018-07-14 DIAGNOSIS — I442 Atrioventricular block, complete: Secondary | ICD-10-CM | POA: Diagnosis not present

## 2018-07-14 NOTE — Progress Notes (Signed)
Remote pacemaker transmission.   

## 2018-07-14 NOTE — Telephone Encounter (Signed)
LMOVM reminding pt to send remote transmission.   

## 2018-07-15 ENCOUNTER — Encounter: Payer: Self-pay | Admitting: Cardiology

## 2018-07-16 ENCOUNTER — Ambulatory Visit (INDEPENDENT_AMBULATORY_CARE_PROVIDER_SITE_OTHER): Payer: Medicare Other | Admitting: *Deleted

## 2018-07-16 DIAGNOSIS — Z5181 Encounter for therapeutic drug level monitoring: Secondary | ICD-10-CM

## 2018-07-16 DIAGNOSIS — I4891 Unspecified atrial fibrillation: Secondary | ICD-10-CM

## 2018-07-16 DIAGNOSIS — Z952 Presence of prosthetic heart valve: Secondary | ICD-10-CM | POA: Diagnosis not present

## 2018-07-16 LAB — POCT INR: INR: 2.5 (ref 2.0–3.0)

## 2018-07-16 NOTE — Patient Instructions (Signed)
Continue coumadin 1 tablet daily  Continue greens Recheck in 6 weeks 

## 2018-07-27 LAB — CUP PACEART REMOTE DEVICE CHECK
Battery Impedance: 753 Ohm
Brady Statistic AP VP Percent: 61 %
Brady Statistic AP VS Percent: 0 %
Brady Statistic AS VP Percent: 39 %
Brady Statistic AS VS Percent: 0 %
Implantable Lead Implant Date: 20130412
Implantable Lead Model: 5076
Implantable Lead Model: 5076
Lead Channel Impedance Value: 479 Ohm
Lead Channel Impedance Value: 563 Ohm
Lead Channel Setting Pacing Amplitude: 2 V
MDC IDC LEAD IMPLANT DT: 20130412
MDC IDC LEAD LOCATION: 753859
MDC IDC LEAD LOCATION: 753860
MDC IDC MSMT BATTERY REMAINING LONGEVITY: 71 mo
MDC IDC MSMT BATTERY VOLTAGE: 2.77 V
MDC IDC MSMT LEADCHNL RV PACING THRESHOLD AMPLITUDE: 0.625 V
MDC IDC MSMT LEADCHNL RV PACING THRESHOLD PULSEWIDTH: 0.4 ms
MDC IDC PG IMPLANT DT: 20130412
MDC IDC SESS DTM: 20190923180410
MDC IDC SET LEADCHNL RV PACING AMPLITUDE: 2 V
MDC IDC SET LEADCHNL RV PACING PULSEWIDTH: 0.4 ms
MDC IDC SET LEADCHNL RV SENSING SENSITIVITY: 4 mV

## 2018-08-14 DIAGNOSIS — H18413 Arcus senilis, bilateral: Secondary | ICD-10-CM | POA: Diagnosis not present

## 2018-08-14 DIAGNOSIS — H43811 Vitreous degeneration, right eye: Secondary | ICD-10-CM | POA: Diagnosis not present

## 2018-08-14 DIAGNOSIS — H5203 Hypermetropia, bilateral: Secondary | ICD-10-CM | POA: Diagnosis not present

## 2018-08-14 DIAGNOSIS — H25813 Combined forms of age-related cataract, bilateral: Secondary | ICD-10-CM | POA: Diagnosis not present

## 2018-08-25 DIAGNOSIS — Z23 Encounter for immunization: Secondary | ICD-10-CM | POA: Diagnosis not present

## 2018-08-27 ENCOUNTER — Ambulatory Visit (INDEPENDENT_AMBULATORY_CARE_PROVIDER_SITE_OTHER): Payer: Medicare Other | Admitting: *Deleted

## 2018-08-27 DIAGNOSIS — I4891 Unspecified atrial fibrillation: Secondary | ICD-10-CM | POA: Diagnosis not present

## 2018-08-27 DIAGNOSIS — Z5181 Encounter for therapeutic drug level monitoring: Secondary | ICD-10-CM | POA: Diagnosis not present

## 2018-08-27 DIAGNOSIS — Z952 Presence of prosthetic heart valve: Secondary | ICD-10-CM | POA: Diagnosis not present

## 2018-08-27 LAB — POCT INR: INR: 2.5 (ref 2.0–3.0)

## 2018-08-27 NOTE — Patient Instructions (Signed)
Continue coumadin 1 tablet daily  Continue greens Recheck in 6 weeks 

## 2018-08-28 ENCOUNTER — Encounter: Payer: Self-pay | Admitting: Adult Health

## 2018-08-28 ENCOUNTER — Ambulatory Visit (INDEPENDENT_AMBULATORY_CARE_PROVIDER_SITE_OTHER): Payer: Medicare Other | Admitting: Adult Health

## 2018-08-28 VITALS — BP 118/60 | HR 79 | Ht 66.0 in | Wt 204.4 lb

## 2018-08-28 DIAGNOSIS — R079 Chest pain, unspecified: Secondary | ICD-10-CM

## 2018-08-28 DIAGNOSIS — I10 Essential (primary) hypertension: Secondary | ICD-10-CM

## 2018-08-28 DIAGNOSIS — Z79899 Other long term (current) drug therapy: Secondary | ICD-10-CM

## 2018-08-28 DIAGNOSIS — I4891 Unspecified atrial fibrillation: Secondary | ICD-10-CM | POA: Diagnosis not present

## 2018-08-28 DIAGNOSIS — D500 Iron deficiency anemia secondary to blood loss (chronic): Secondary | ICD-10-CM

## 2018-08-28 DIAGNOSIS — I480 Paroxysmal atrial fibrillation: Secondary | ICD-10-CM

## 2018-08-28 DIAGNOSIS — I2510 Atherosclerotic heart disease of native coronary artery without angina pectoris: Secondary | ICD-10-CM

## 2018-08-28 NOTE — Progress Notes (Signed)
Cardiology Office Note   Date:  08/28/2018   ID:  Douglas [name], DOB 1928/01/15, MRN [id number]  PCP:  [name], MD  Cardiologist:  Domenic Polite Electrophysiologist: Dr. Lovena Le Chief Complaint  Patient presents with  . Follow-up     History of Present Illness: Douglas [name] is a 84 y.o. male who presents for ongoing assessment and management of CHB requiring a PPM (Medtronic DDD) atypical chest pain, thought to be GI in etiology, AAA, s/p Bentall procedure PAF on coumadin, and hypercholesterolemia. Was last seen by Dr. Lovena Le in 6.2019 and was stable from CV standpoint. Stress test 06/22/2014 was normal.   He comes today with recurrent chest tightness and substernal burning with and without exertion. This is not associated with eating. He states that he took a NTG which helped. He is uncertain if it is GI or heart, as he has been taking PPI as directed. This has been occurring more often for the last few weeks. When last seen in the coumadin clinic this week he asked to be seen.   Past Medical History:  Diagnosis Date  . Acute upper GI bleed 05/18/2016  . Aneurysm of thoracic aorta (HCC)    Ascending. 5.7 x 5.5cm - Bentall procedure  . Aortic stenosis    Bioprosthetic AVR  . Aortic valve replaced 08/25/2012  . Arthritis   . Atrial fibrillation ([facility])    Documented on pacer interrogation 6/13  . CHB (complete heart block) ([facility]) 01/31/2012  . Complete heart block ([facility])    Status post pacemaker placement  . Coronary atherosclerosis of native coronary artery    Multivessel - LIMA to LAD, SVG to OM, SVG to PDA  . DJD (degenerative joint disease)   . Esophageal dysmotility   . Essential hypertension, benign   . Fibromyalgia   . Gout   . H/O arteriovenous malformation (AVM)   . H/O hiatal hernia   . Hemorrhoid 12/11/2017  . History of esophageal ulcer   . History of skin cancer   . Hx of adenomatous colonic polyps   . Iron deficiency anemia due to chronic blood loss 10/03/2016  . Melena    . Pacemaker-Medtronic 07/29/2012  . PMR (polymyalgia rheumatica) (HCC)   . Precordial pain 06/15/2014  . Presence of permanent cardiac pacemaker   . SIRS (systemic inflammatory response syndrome) ([facility]) 01/16/2015    Past Surgical History:  Procedure Laterality Date  . AORTIC VALVE REPLACEMENT  01/24/12  . APPENDECTOMY    . [place] or 1966 lumbar back surgery  . BENTALL PROCEDURE  01/24/2012   Procedure: BENTALL PROCEDURE;  Surgeon: Ivin Poot, MD;  Location: [facility];  Service: Open Heart Surgery;  Laterality: N/A;  . BILATERAL KNEE ARTHROSCOPY    . CATARACT EXTRACTION W/PHACO Left 12/03/2016   Procedure: CATARACT EXTRACTION PHACO AND INTRAOCULAR LENS PLACEMENT LEFT EYE;  Surgeon: Tonny Branch, MD;  [facility];  Service: Ophthalmology;  Laterality: Left;  CDE:  10.87  . CATARACT EXTRACTION W/PHACO Right 12/13/2016   Procedure: CATARACT EXTRACTION PHACO AND INTRAOCULAR LENS PLACEMENT (IOC);  Surgeon: Tonny Branch, MD;  Location[facility];  Service: Ophthalmology;  Laterality: Right;  right cde 11.78  . CORONARY ARTERY BYPASS GRAFT  01/24/2012   Procedure: CORONARY ARTERY BYPASS GRAFTING (CABG);  Surgeon: Ivin Poot, MD;  Location: [facility];  Service: Open Heart Surgery;  Laterality: N/A;  . ESOPHAGOGASTRODUODENOSCOPY  06/17/2012   Procedure: ESOPHAGOGASTRODUODENOSCOPY (EGD);  Surgeon: Rogene Houston, MD;  [facility];  Service: Endoscopy;  Laterality: N/A;  ED  . ESOPHAGOGASTRODUODENOSCOPY N/A 05/19/2016   Procedure: ESOPHAGOGASTRODUODENOSCOPY (EGD);  Surgeon: Danie Binder, MD;  [facility];  Service: Endoscopy;  Laterality: N/A;  . HEMORROIDECTOMY    . HERNIA REPAIR    . Left inguinal hernia repair    . PACEMAKER PLACEMENT     Medtronic 4/13 - Dr. Rayann Heman  . PERMANENT PACEMAKER INSERTION N/A 02/01/2012   Procedure: PERMANENT PACEMAKER INSERTION;  Surgeon: Thompson Grayer, MD; [place] 1 (serial number [id number])       Current  Outpatient Medications  Medication Sig Dispense Refill  . acetaminophen (TYLENOL) 500 MG tablet Take 1,000 mg by mouth every 6 (six) hours as needed for mild pain.    Marland Kitchen allopurinol (ZYLOPRIM) 300 MG tablet Take 450 mg by mouth daily. Take 1 1/2 daily    . bisacodyl (DULCOLAX) 10 MG suppository Place 1 suppository (10 mg total) rectally as needed for moderate constipation. 12 suppository 0  . furosemide (LASIX) 40 MG tablet TAKE 1 TABLET BY MOUTH ONCE DAILY. 30 tablet 3  . metoprolol succinate (TOPROL-XL) 25 MG 24 hr tablet TAKE (2) TABLETS BY MOUTH DAILY. 60 tablet 10  . nitroGLYCERIN (NITROSTAT) 0.4 MG SL tablet Place 1 tablet (0.4 mg total) under the tongue every 5 (five) minutes as needed for chest pain. 30 tablet 3  . pantoprazole (PROTONIX) 40 MG tablet TAKE ONE TABLET BY MOUTH ONCE DAILY IN THE MORNING. 30 tablet 6  . pravastatin (PRAVACHOL) 20 MG tablet Take 20 mg by mouth every evening.   11  . sennosides-docusate sodium (SENOKOT-S) 8.6-50 MG tablet Take 1 tablet by mouth 2 (two) times daily.    Marland Kitchen warfarin (COUMADIN) 5 MG tablet TAKE 1 TABLET BY MOUTH ONCE DAILY, EXCEPT TAKE 1/2 TABLET ON SUNDAYS. 90 tablet 3   No current facility-administered medications for this visit.     Allergies:   Imdur [isosorbide nitrate]    Social History:  The patient  reports that he has never smoked. He has never used smokeless tobacco. He reports that he does not drink alcohol or use drugs.   Family History:  The patient's family history includes Heart disease in his father; Kidney disease in his mother.    ROS: All other systems are reviewed and negative. Unless otherwise mentioned in H&P    PHYSICAL EXAM: VS:  There were no vitals taken for this visit. , BMI There is no height or weight on file to calculate BMI. GEN: Well nourished, well developed, in no acute distress HEENT: normal Neck: no JVD, carotid bruits, or masses Cardiac: RRR; 1/6 systolic  murmurs, rubs, or gallops,no edema    Respiratory:  Clear to auscultation bilaterally, normal work of breathing GI: soft, nontender, nondistended, + BS MS: no deformity or atrophy Skin: warm and dry, no rash Neuro:  Strength and sensation are intact Psych: euthymic mood, full affect   EKG:  AV Pacing rate of 79 bpm.  Recent Labs: 08/30/2017: BUN 30; Creatinine, Ser 1.36; Potassium 4.6; Sodium 137 02/21/2018: Hemoglobin 13.3; Platelets 170    Lipid Panel No results found for: CHOL, TRIG, HDL, CHOLHDL, VLDL, LDLCALC, LDLDIRECT    Wt Readings from Last 3 Encounters:  04/11/18 206 lb (93.4 kg)  04/01/18 207 lb (93.9 kg)  12/30/17 207 lb (93.9 kg)      Other studies Reviewed:  Echocardiogram 11/10/2015: Study Conclusions  - Left ventricle: The cavity size was normal.  Wall thickness was increased in a pattern of moderate LVH. Systolic function was normal. The estimated ejection fraction was in the range of 55% to 60%. Wall motion was normal; there were no regional wall motion abnormalities. Features are consistent with a pseudonormal left ventricular filling pattern, with concomitant abnormal relaxation and increased filling pressure (grade 2 diastolic dysfunction). Doppler parameters are consistent with high ventricular filling pressure. - Aortic valve: History of Bental procedure with 23 mm Edwards pericardial valve along with 20mm Gelweave Valsalva ascending aorta graft. Leaflets poorly visualized, there does appear to be decreased leaflet mobility however there is no significant gradient across the valve. Mildly calcified annulus. Mildly thickened leaflets. Mean gradient (S): 5 mm Hg. Peak gradient (S): 10 mm Hg. VTI ratio of LVOT to aortic valve: 0.76. Valve area (VTI): 1.94 cm^2. Valve area (Vmax): 1.89 cm^2. Valve area (Vmean): 1.96 cm^2. - Aorta: Patient with history of Bentall procedure with 28 mm Gelweave Valsalva graft. - Aortic root: The aortic root was normal in  size. - Mitral valve: Mildly calcified annulus. Mildly thickened leaflets . There was mild regurgitation. - Right atrium: The atrium was mildly dilated. - Atrial septum: No defect or patent foramen ovale was identified. - Technically difficult study. Echocontrast was used to enhance visualization.  PPM interrogation: 07/14/2018 Notes recorded by Evans Lance, MD on 07/31/2018 at 10:54 AM EDT Remote device check reviewed. Histograms appropriate. Leads and battery stable for patient. Follow up as outlined above. No recommended changes.      Conclusion   Normal Remote Reviewed 1 AT~4sec [facility] 12/23/2019Elizabeth Watts RN     ASSESSMENT AND PLAN:  1.  Chest Pain: In the past has been diagnosed as atypical with normal stress test in 2015. Due to recurrent symptoms and CVRF to include age, gender, HL, anemia, and hypertension, he will be scheduled for a Lexiscan stress test, this can be done at [facility] in [place] as this is closer for him to get to. I will check BMET to evaluate for electrolyte disturbances.   2. PPM: Has been recently interrogated in Sept and functioning normally. Follow by Dr. Lovena Le,   3. WVP:XTGGYIRSW on coumadin. I will check CBC as he has hx of GI Bleed and iron deficiency anemia.   4. Hypercholesterolemia: Labs are completed by PCP, Dr. Willey Blade.     Current medicines are reviewed at length with the patient today.    Labs/ tests ordered today include: NM Lexiscan and BMET with [place]. [place] Pugh, ANP, AACC   08/28/2018 8:56 AM    [address] Office [phone] Fax [phone]

## 2018-08-28 NOTE — Addendum Note (Signed)
Addended by: Waylan Rocher on: 08/28/2018 09:52 AM   Modules accepted: Orders

## 2018-08-28 NOTE — Patient Instructions (Addendum)
Medication Instructions:  NO CHANGES- Your physician recommends that you continue on your current medications as directed. Please refer to the Current Medication list given to you today.  If you need a refill on your cardiac medications before your next appointment, please call your pharmacy.  Labwork: CBC AND BMET TODAY HERE IN OUR OFFICE AT [facility]  Take the provided lab slips with you to the lab for your blood draw.   If you have labs (blood work) drawn today and your tests are completely normal, you will receive your results only by: Marland Kitchen MyChart Message (if you have MyChart) OR . A paper copy in the mail If you have any lab test that is abnormal or we need to change your treatment, we will call you to review the results.  Testing/Procedures: Your physician has requested that you have a lexiscan myoview. A cardiac stress test is a cardiological test that measures the heart's ability to respond to external stress in a controlled clinical environment. The stress response is induced by intravenous pharmacological stimulation.   Follow-Up: You will need a follow up appointment in 2 WEEKS .  You may see  DR MCDOWELL,  Jory Sims, DNP, ANP -OR- one of the Advanced Practice Providers on your designated Care Team  At [facility], you and your health needs are our priority.  As part of our continuing mission to provide you with exceptional heart care, we have created designated Provider Care Teams.  These Care Teams include your primary Cardiologist (physician) and Advanced Practice Providers (APPs -  Physician Assistants and Nurse Practitioners) who all work together to provide you with the care you need, when you need it.  Thank you for choosing [facility] at [facility]!!

## 2018-08-29 ENCOUNTER — Other Ambulatory Visit (HOSPITAL_COMMUNITY)
Admission: RE | Admit: 2018-08-29 | Discharge: 2018-08-29 | Disposition: A | Discharge status: Home / Self Care | Payer: Medicare Other | Source: Ambulatory Visit | Attending: Adult Health | Admitting: Adult Health

## 2018-08-29 DIAGNOSIS — I2510 Atherosclerotic heart disease of native coronary artery without angina pectoris: Secondary | ICD-10-CM | POA: Diagnosis not present

## 2018-08-29 DIAGNOSIS — Z79899 Other long term (current) drug therapy: Secondary | ICD-10-CM | POA: Diagnosis not present

## 2018-08-29 DIAGNOSIS — B03 Smallpox: Secondary | ICD-10-CM | POA: Diagnosis not present

## 2018-08-29 DIAGNOSIS — R079 Chest pain, unspecified: Secondary | ICD-10-CM | POA: Insufficient documentation

## 2018-08-29 LAB — BASIC METABOLIC PANEL
ANION GAP: 5 (ref 5–15)
BUN: 21 mg/dL (ref 8–23)
CALCIUM: 9.3 mg/dL (ref 8.9–10.3)
CO2: 29 mmol/L (ref 22–32)
CREATININE: 1.18 mg/dL (ref 0.61–1.24)
Chloride: 102 mmol/L (ref 98–111)
GFR calc Af Amer: >60 mL/min (ref >60)
GFR calc non Af Amer: 53 mL/min — ABNORMAL LOW (ref >60)
Glucose, Bld: 109 mg/dL — ABNORMAL HIGH (ref 70–99)
Potassium: 4.6 mmol/L (ref 3.5–5.1)
SODIUM: 136 mmol/L (ref 135–145)

## 2018-08-29 LAB — CBC
HCT: 45.2 % (ref 39.0–52.0)
Hemoglobin: 14 g/dL (ref 13.0–17.0)
MCH: 30.7 pg (ref 26.0–34.0)
MCHC: 31 g/dL (ref 30.0–36.0)
MCV: 99.1 fL (ref 80.0–100.0)
PLATELETS: 174 10*3/uL (ref 150–400)
RBC: 4.56 MIL/uL (ref 4.22–5.81)
RDW: 14.1 % (ref 11.5–15.5)
WBC: 7.3 10*3/uL (ref 4.0–10.5)
nRBC: 0 % (ref 0.0–0.2)

## 2018-09-05 ENCOUNTER — Encounter (HOSPITAL_COMMUNITY): Payer: Self-pay

## 2018-09-05 ENCOUNTER — Encounter (HOSPITAL_COMMUNITY)
Admission: RE | Admit: 2018-09-05 | Discharge: 2018-09-05 | Disposition: A | Discharge status: Home / Self Care | Payer: Medicare Other | Source: Ambulatory Visit | Attending: Adult Health | Admitting: Adult Health

## 2018-09-05 ENCOUNTER — Encounter (HOSPITAL_BASED_OUTPATIENT_CLINIC_OR_DEPARTMENT_OTHER)
Admission: RE | Admit: 2018-09-05 | Discharge: 2018-09-05 | Disposition: A | Discharge status: Home / Self Care | Payer: Medicare Other | Source: Ambulatory Visit | Attending: Adult Health | Admitting: Adult Health

## 2018-09-05 DIAGNOSIS — R079 Chest pain, unspecified: Secondary | ICD-10-CM | POA: Insufficient documentation

## 2018-09-05 DIAGNOSIS — I2510 Atherosclerotic heart disease of native coronary artery without angina pectoris: Secondary | ICD-10-CM

## 2018-09-05 LAB — NM MYOCAR MULTI W/SPECT W/WALL MOTION / EF
CHL CUP RESTING HR STRESS: 70 {beats}/min
LVDIAVOL: 93 mL (ref 62–150)
LVSYSVOL: 78 mL
NUC STRESS TID: 1.25
Peak HR: 70 {beats}/min
RATE: 0.32
SDS: 2
SRS: 3
SSS: 5

## 2018-09-05 MED ORDER — SODIUM CHLORIDE 0.9% FLUSH
INTRAVENOUS | Status: AC
  Administered 2018-09-05: 10 mL via INTRAVENOUS
  Filled 2018-09-05: qty 10

## 2018-09-05 MED ORDER — TECHNETIUM TC 99M TETROFOSMIN IV KIT
10.0000 | PACK | Freq: Once | INTRAVENOUS | Status: AC | PRN
  Administered 2018-09-05: 9.3 via INTRAVENOUS

## 2018-09-05 MED ORDER — REGADENOSON 0.4 MG/5ML IV SOLN
INTRAVENOUS | Status: AC
  Administered 2018-09-05: 0.4 mg via INTRAVENOUS
  Filled 2018-09-05: qty 5

## 2018-09-05 MED ORDER — TECHNETIUM TC 99M TETROFOSMIN IV KIT
30.0000 | PACK | Freq: Once | INTRAVENOUS | Status: AC | PRN
  Administered 2018-09-05: 31 via INTRAVENOUS

## 2018-09-11 ENCOUNTER — Encounter: Payer: Self-pay | Admitting: Adult Health

## 2018-09-11 ENCOUNTER — Ambulatory Visit (INDEPENDENT_AMBULATORY_CARE_PROVIDER_SITE_OTHER): Payer: Medicare Other | Admitting: Adult Health

## 2018-09-11 VITALS — BP 122/74 | HR 74 | Ht 66.0 in | Wt 204.8 lb

## 2018-09-11 DIAGNOSIS — R079 Chest pain, unspecified: Secondary | ICD-10-CM | POA: Diagnosis not present

## 2018-09-11 DIAGNOSIS — E7800 Pure hypercholesterolemia, unspecified: Secondary | ICD-10-CM | POA: Diagnosis not present

## 2018-09-11 DIAGNOSIS — I2510 Atherosclerotic heart disease of native coronary artery without angina pectoris: Secondary | ICD-10-CM | POA: Diagnosis not present

## 2018-09-11 DIAGNOSIS — I519 Heart disease, unspecified: Secondary | ICD-10-CM

## 2018-09-11 DIAGNOSIS — R9439 Abnormal result of other cardiovascular function study: Secondary | ICD-10-CM

## 2018-09-11 DIAGNOSIS — I10 Essential (primary) hypertension: Secondary | ICD-10-CM

## 2018-09-11 NOTE — Patient Instructions (Addendum)
Medication Instructions:  NO CHANGES- Your physician recommends that you continue on your current medications as directed. Please refer to the Current Medication list given to you today.  If you need a refill on your cardiac medications before your next appointment, please call your pharmacy.  Testing/Procedures: Echocardiogram - Your physician has requested that you have an echocardiogram. Echocardiography is a painless test that uses sound waves to create images of your heart. It provides your doctor with information about the size and shape of your heart and how well your heart's chambers and valves are working. This procedure takes approximately one hour. There are no restrictions for this procedure. This will be performed at our [facility] location - [address], [address].  Follow-Up: You will need a follow up appointment in 2 WEEKS-AFTER ECHO.   You may see  DR MCDOWELL,  Jory Sims, DNP, AACC or one of the Advanced Practice Providers on your designated Care Team.  Labwork: If you have labs (blood work) drawn today and your tests are completely normal, you will receive your results only by: Marland Kitchen MyChart Message (if you have MyChart) OR . A paper copy in the mail If you have any lab test that is abnormal or we need to change your treatment, we will call you to review the results.   At [facility], you and your health needs are our priority.  As part of our continuing mission to provide you with exceptional heart care, we have created designated Provider Care Teams.  These Care Teams include your primary Cardiologist (physician) and Advanced Practice Providers (APPs -  Physician Assistants and Nurse Practitioners) who all work together to provide you with the care you need, when you need it.  Thank you for choosing [facility] at [facility]!!

## 2018-09-11 NOTE — Progress Notes (Signed)
Cardiology Office Note   Date:  09/11/2018   ID:  Douglas [name], DOB 1928/06/01, MRN [id number]  PCP:  [name], MD  Cardiologist: Dr. Domenic Polite  Chief Complaint  Patient presents with  . Coronary Artery Disease  . Follow-up  . Chest Pain     History of Present Illness: Douglas [name] is a 84 y.o. male who presents for ongoing assessment and management of CAD,hx of CABG in 2013 with LIMA to LAD, SVG to OM, SV to PDA), CHB requiring a PPM (Medtronic DDD) atypical chest pain, thought to be GI in etiology, AAA repair, s/p Bentall procedure with biologic valve,  PAF on coumadin, and hypercholesterolemia. He was last seen on 08/28/2018 with complaints of chest pain, severe heart burn which was relieved with NTG.He was scheduled for NM stress test   Study Result NM  09/05/2018    Defect 1: There is a medium defect of mild severity present in the mid inferior, mid inferolateral and apical inferior location.  Findings consistent with a mild to moderate degree of ischemia.  This is a high risk study based primarily on severely reduced LVEF.  Nuclear stress EF: 16%.  ECG showed AV pacing throughout study.   Today he is here without further complaints. He states there has not been any further recurrence of heartburn or chest pain. He has not had to take any NTG, or OTC Tums. His energy level has remained the same.   Past Medical History:  Diagnosis Date  . Acute upper GI bleed 05/18/2016  . Aneurysm of thoracic aorta (HCC)    Ascending. 5.7 x 5.5cm - Bentall procedure  . Aortic stenosis    Bioprosthetic AVR  . Aortic valve replaced 08/25/2012  . Arthritis   . Atrial fibrillation ([place])    Documented on pacer interrogation 6/13  . CHB (complete heart block) ([place]) 01/31/2012  . Complete heart block ([place])    Status post pacemaker placement  . Coronary atherosclerosis of native coronary artery    Multivessel - LIMA to LAD, SVG to OM, SVG to PDA  . DJD (degenerative joint disease)   .  Esophageal dysmotility   . Essential hypertension, benign   . Fibromyalgia   . Gout   . H/O arteriovenous malformation (AVM)   . H/O hiatal hernia   . Hemorrhoid 12/11/2017  . History of esophageal ulcer   . History of skin cancer   . Hx of adenomatous colonic polyps   . Iron deficiency anemia due to chronic blood loss 10/03/2016  . Melena   . Pacemaker-Medtronic 07/29/2012  . PMR (polymyalgia rheumatica) (HCC)   . Precordial pain 06/15/2014  . Presence of permanent cardiac pacemaker   . SIRS (systemic inflammatory response syndrome) ([place]) 01/16/2015    Past Surgical History:  Procedure Laterality Date  . AORTIC VALVE REPLACEMENT  01/24/12  . APPENDECTOMY    . [place] or 1966 lumbar back surgery  . BENTALL PROCEDURE  01/24/2012   Procedure: BENTALL PROCEDURE;  Surgeon: Ivin Poot, MD;  Location: [place];  Service: Open Heart Surgery;  Laterality: N/A;  . BILATERAL KNEE ARTHROSCOPY    . CATARACT EXTRACTION W/PHACO Left 12/03/2016   Procedure: CATARACT EXTRACTION PHACO AND INTRAOCULAR LENS PLACEMENT LEFT EYE;  Surgeon: Tonny Branch, MD;  Location[facility];  Service: Ophthalmology;  Laterality: Left;  CDE:  10.87  . CATARACT EXTRACTION W/PHACO Right 12/13/2016   Procedure: CATARACT EXTRACTION PHACO AND INTRAOCULAR LENS PLACEMENT (IOC);  Surgeon:  Tonny Branch, MD;  Location: [facility];  Service: Ophthalmology;  Laterality: Right;  right cde 11.78  . CORONARY ARTERY BYPASS GRAFT  01/24/2012   Procedure: CORONARY ARTERY BYPASS GRAFTING (CABG);  Surgeon: Ivin Poot, MD;  Location: [place];  Service: Open Heart Surgery;  Laterality: N/A;  . ESOPHAGOGASTRODUODENOSCOPY  06/17/2012   Procedure: ESOPHAGOGASTRODUODENOSCOPY (EGD);  Surgeon: Rogene Houston, MD;  [facility];  Service: Endoscopy;  Laterality: N/A;  ED  . ESOPHAGOGASTRODUODENOSCOPY N/A 05/19/2016   Procedure: ESOPHAGOGASTRODUODENOSCOPY (EGD);  Surgeon: Danie Binder, MD;  [facility];  Service: Endoscopy;   Laterality: N/A;  . HEMORROIDECTOMY    . HERNIA REPAIR    . Left inguinal hernia repair    . PACEMAKER PLACEMENT     Medtronic 4/13 - Dr. Rayann Heman  . PERMANENT PACEMAKER INSERTION N/A 02/01/2012   Procedure: PERMANENT PACEMAKER INSERTION;  Surgeon: Thompson Grayer, MD; [place] 1 (serial number [id number])       Current Outpatient Medications  Medication Sig Dispense Refill  . acetaminophen (TYLENOL) 500 MG tablet Take 1,000 mg by mouth every 6 (six) hours as needed for mild pain.    Marland Kitchen allopurinol (ZYLOPRIM) 300 MG tablet Take 450 mg by mouth daily. Take 1 1/2 daily    . furosemide (LASIX) 40 MG tablet TAKE 1 TABLET BY MOUTH ONCE DAILY. 30 tablet 3  . metoprolol succinate (TOPROL-XL) 25 MG 24 hr tablet TAKE (2) TABLETS BY MOUTH DAILY. 60 tablet 10  . pantoprazole (PROTONIX) 40 MG tablet TAKE ONE TABLET BY MOUTH ONCE DAILY IN THE MORNING. 30 tablet 6  . pravastatin (PRAVACHOL) 20 MG tablet Take 20 mg by mouth every evening.   11  . sennosides-docusate sodium (SENOKOT-S) 8.6-50 MG tablet Take 1 tablet by mouth 2 (two) times daily.    Marland Kitchen warfarin (COUMADIN) 5 MG tablet TAKE 1 TABLET BY MOUTH ONCE DAILY, EXCEPT TAKE 1/2 TABLET ON SUNDAYS. (Patient taking differently: TAKE 1 TABLET BY MOUTH ONCE DAILY.) 90 tablet 3  . nitroGLYCERIN (NITROSTAT) 0.4 MG SL tablet Place 1 tablet (0.4 mg total) under the tongue every 5 (five) minutes as needed for chest pain. 30 tablet 3   No current facility-administered medications for this visit.     Allergies:   Imdur [isosorbide nitrate]    Social History:  The patient  reports that he has never smoked. He has never used smokeless tobacco. He reports that he does not drink alcohol or use drugs.   Family History:  The patient's family history includes Heart disease in his father; Kidney disease in his mother.    ROS: All other systems are reviewed and negative. Unless otherwise mentioned in H&P    PHYSICAL EXAM: VS:  BP 122/74    Pulse 74   Ht 5\' 6"  (1.676 m)   Wt 204 lb 12.8 oz (92.9 kg)   BMI 33.06 kg/m  , BMI Body mass index is 33.06 kg/m. GEN: Well nourished, well developed, in no acute distress HEENT: normal Neck: no JVD, carotid bruits, or masses Cardiac: RRR; no murmurs, rubs, or gallops,no edema  Respiratory:  Clear to auscultation bilaterally, normal work of breathing GI: soft, nontender, nondistended, + BS MS: no deformity or atrophy Skin: warm and dry, no rash Neuro:  Strength and sensation are intact Psych: euthymic mood, full affect   EKG:  AV sequential pacing Rate of 74 bpm  Recent Labs: 08/29/2018: BUN 21; Creatinine, Ser 1.18; Hemoglobin 14.0; Platelets 174; Potassium  4.6; Sodium 136    Lipid Panel No results found for: CHOL, TRIG, HDL, CHOLHDL, VLDL, LDLCALC, LDLDIRECT    Wt Readings from Last 3 Encounters:  09/11/18 204 lb 12.8 oz (92.9 kg)  08/28/18 204 lb 6.4 oz (92.7 kg)  04/11/18 206 lb (93.4 kg)      Other studies Reviewed: Echocardiogram in 2017 - Left ventricle: The cavity size was normal. Wall thickness was   increased in a pattern of moderate LVH. Systolic function was   normal. The estimated ejection fraction was in the range of 55%   to 60%. Wall motion was normal; there were no regional wall   motion abnormalities. Features are consistent with a pseudonormal   left ventricular filling pattern, with concomitant abnormal   relaxation and increased filling pressure (grade 2 diastolic   dysfunction). Doppler parameters are consistent with high   ventricular filling pressure. - Aortic valve: History of Bental procedure with 23 mm Edwards   pericardial valve along with 26mm Gelweave Valsalva ascending   aorta graft. Leaflets poorly visualized, there does appear to be   decreased leaflet mobility however there is no significant   gradient across the valve. Mildly calcified annulus. Mildly   thickened leaflets. Mean gradient (S): 5 mm Hg. Peak gradient   (S): 10 mm Hg.  VTI ratio of LVOT to aortic valve: 0.76. Valve   area (VTI): 1.94 cm^2. Valve area (Vmax): 1.89 cm^2. Valve area   (Vmean): 1.96 cm^2. - Aorta: Patient with history of Bentall procedure with 28 mm   Gelweave Valsalva graft. - Aortic root: The aortic root was normal in size. - Mitral valve: Mildly calcified annulus. Mildly thickened leaflets   . There was mild regurgitation. - Right atrium: The atrium was mildly dilated. - Atrial septum: No defect or patent foramen ovale was identified. - Technically difficult study. Echocontrast was used to enhance   visualization.   ASSESSMENT AND PLAN:  1. CAD: Hx of 3 vessel CABG in 2013, with recurrent heartburn and chest pain. I have reviewed the NM study which revealed inferior ischemia mild severity present in the mid inferior, mid inferolateral and apical inferior location. I have reviewed this with Dr. Claiborne Billings, DOD today at [facility], He does not feel the patient needs to have cath at this time, but recommends repeating echocardiogram to evaluate for change in EF as NM study EF was so low. Doubt that this is the case. If, his EF remains diminished, then can consider cardiac cath at that time.         This has been explained to the patient.If he has recurrent chest pain or severe discomfort, he is to report this to [place]. He has NTG sublingual to use prn.   2. AoV disease: S/P biologic AoV repair at the same time he had CABG in 2013 with Bentyl procedure. Echo in 2017 did not find significant gradient across the valve.. Will re-evaluate on echo.   3. PAF on coumadin: Followed by our coumadin clinic in [place].     4. Hypercholesterolemia: Will need follow up labs within 6 months with goal of LDL < 70       Current medicines are reviewed at length with the patient today.    Labs/ tests ordered today include: Echocardiogram  Phill Myron. West Pugh, ANP, AACC   09/11/2018 1:04 PM    [facility]  [address] [phone] Fax [phone]

## 2018-09-22 ENCOUNTER — Ambulatory Visit (HOSPITAL_COMMUNITY)
Admission: RE | Admit: 2018-09-22 | Discharge: 2018-09-22 | Disposition: A | Discharge status: Home / Self Care | Payer: Medicare Other | Source: Ambulatory Visit | Attending: Adult Health | Admitting: Adult Health

## 2018-09-22 DIAGNOSIS — I519 Heart disease, unspecified: Secondary | ICD-10-CM | POA: Diagnosis not present

## 2018-09-22 DIAGNOSIS — R9439 Abnormal result of other cardiovascular function study: Secondary | ICD-10-CM | POA: Diagnosis not present

## 2018-09-22 MED ORDER — PERFLUTREN LIPID MICROSPHERE
1.0000 mL | INTRAVENOUS | Status: AC | PRN
  Administered 2018-09-22: 2 mL via INTRAVENOUS
  Administered 2018-09-22: 1 mL via INTRAVENOUS

## 2018-09-22 NOTE — Progress Notes (Signed)
*  PRELIMINARY RESULTS* Echocardiogram 2D Echocardiogram has been performed with Def[place].  Douglas [name] 09/22/2018, 10:42 AM

## 2018-09-25 ENCOUNTER — Ambulatory Visit: Payer: Medicare Other | Admitting: Cardiology

## 2018-09-26 ENCOUNTER — Telehealth: Payer: Self-pay | Admitting: Adult Health

## 2018-10-06 NOTE — Progress Notes (Signed)
Cardiology Office Note   Date:  10/07/2018   ID:  Douglas [name], DOB [birth year], MRN [id number]  PCP:  [name], MD  Cardiologist: Dr. Domenic Polite Linna Hoff)  Chief Complaint  Patient presents with  . Coronary Artery Disease  . AAA    s/p repair   . Congestive Heart Failure     History of Present Illness: Douglas [name] is a 84 y.o. male who presents for ongoing assessment and management of CAD, hx of CABG in 2013 with LIMA to LAD, SVG to OM, SV to PDA), CHB requiring a PPM (Medtronic DDD) atypical chest pain, thought to be GI in etiology, AAA repair, s/p Bentall procedure with biologic valve,  PAF on coumadin, and hypercholesterolemia.  He has had a NM stress test revealing a medium defect of mild severity present in the mid infero, mid inferolateral and apical inferior location. EF of 16%.   An echocardiogram was ordered for accuracy of LVEF as a cardiac cath was not recommended at that time, as he was completley asymptomatic.  Echo on 09/22/2018 revealed normal LVEF of 55%-60%. Mild hypokinesis of the apical anteroseptal myocardium possibly related to RV pacing.   He comes today without any complaints. He is feeling well and has not edema, DOE, or chest discomfort.   Past Medical History:  Diagnosis Date  . Acute upper GI bleed 05/18/2016  . Aneurysm of thoracic aorta (HCC)    Ascending. 5.7 x 5.5cm - Bentall procedure  . Aortic stenosis    Bioprosthetic AVR  . Aortic valve replaced 08/25/2012  . Arthritis   . Atrial fibrillation ([place])    Documented on pacer interrogation 6/13  . CHB (complete heart block) ([place]) 01/31/2012  . Complete heart block ([place])    Status post pacemaker placement  . Coronary atherosclerosis of native coronary artery    Multivessel - LIMA to LAD, SVG to OM, SVG to PDA  . DJD (degenerative joint disease)   . Esophageal dysmotility   . Essential hypertension, benign   . Fibromyalgia   . Gout   . H/O arteriovenous malformation (AVM)   . H/O hiatal  hernia   . Hemorrhoid 12/11/2017  . History of esophageal ulcer   . History of skin cancer   . Hx of adenomatous colonic polyps   . Iron deficiency anemia due to chronic blood loss 10/03/2016  . Melena   . Pacemaker-Medtronic 07/29/2012  . PMR (polymyalgia rheumatica) (HCC)   . Precordial pain 06/15/2014  . Presence of permanent cardiac pacemaker   . SIRS (systemic inflammatory response syndrome) ([place]) 01/16/2015    Past Surgical History:  Procedure Laterality Date  . AORTIC VALVE REPLACEMENT  01/24/12  . APPENDECTOMY    . [place] or 1966 lumbar back surgery  . BENTALL PROCEDURE  01/24/2012   Procedure: BENTALL PROCEDURE;  Surgeon: Ivin Poot, MD;  Location: [place];  Service: Open Heart Surgery;  Laterality: N/A;  . BILATERAL KNEE ARTHROSCOPY    . CATARACT EXTRACTION W/PHACO Left 12/03/2016   Procedure: CATARACT EXTRACTION PHACO AND INTRAOCULAR LENS PLACEMENT LEFT EYE;  Surgeon: Tonny Branch, MD;  [facility];  Service: Ophthalmology;  Laterality: Left;  CDE:  10.87  . CATARACT EXTRACTION W/PHACO Right 12/13/2016   Procedure: CATARACT EXTRACTION PHACO AND INTRAOCULAR LENS PLACEMENT (IOC);  Surgeon: Tonny Branch, MD;  Location: [facility];  Service: Ophthalmology;  Laterality: Right;  right cde 11.78  . CORONARY ARTERY BYPASS GRAFT  01/24/2012   Procedure: CORONARY  ARTERY BYPASS GRAFTING (CABG);  Surgeon: Ivin Poot, MD;  Location: [place];  Service: Open Heart Surgery;  Laterality: N/A;  . ESOPHAGOGASTRODUODENOSCOPY  06/17/2012   Procedure: ESOPHAGOGASTRODUODENOSCOPY (EGD);  Surgeon: Rogene Houston, MD;  [facility];  Service: Endoscopy;  Laterality: N/A;  ED  . ESOPHAGOGASTRODUODENOSCOPY N/A 05/19/2016   Procedure: ESOPHAGOGASTRODUODENOSCOPY (EGD);  Surgeon: Danie Binder, MD;  [facility];  Service: Endoscopy;  Laterality: N/A;  . HEMORROIDECTOMY    . HERNIA REPAIR    . Left inguinal hernia repair    . PACEMAKER PLACEMENT     Medtronic 4/13 - Dr.  Rayann Heman  . PERMANENT PACEMAKER INSERTION N/A 02/01/2012   Procedure: PERMANENT PACEMAKER INSERTION;  Surgeon: Thompson Grayer, MD; [place] 1 (serial number [id number])       Current Outpatient Medications  Medication Sig Dispense Refill  . acetaminophen (TYLENOL) 500 MG tablet Take 1,000 mg by mouth every 6 (six) hours as needed for mild pain.    Marland Kitchen allopurinol (ZYLOPRIM) 300 MG tablet Take 450 mg by mouth daily. Take 1 1/2 daily    . furosemide (LASIX) 40 MG tablet Take 20 mg by mouth daily.    . metoprolol succinate (TOPROL-XL) 25 MG 24 hr tablet TAKE (2) TABLETS BY MOUTH DAILY. 60 tablet 10  . pantoprazole (PROTONIX) 40 MG tablet TAKE ONE TABLET BY MOUTH ONCE DAILY IN THE MORNING. 30 tablet 6  . pravastatin (PRAVACHOL) 20 MG tablet Take 20 mg by mouth every evening.   11  . sennosides-docusate sodium (SENOKOT-S) 8.6-50 MG tablet Take 1 tablet by mouth 2 (two) times daily.    Marland Kitchen warfarin (COUMADIN) 5 MG tablet Take 1 tablet (5 mg total) by mouth daily as directed by coumadin clinic.    . nitroGLYCERIN (NITROSTAT) 0.4 MG SL tablet Place 1 tablet (0.4 mg total) under the tongue every 5 (five) minutes as needed for chest pain. 30 tablet 3   No current facility-administered medications for this visit.     Allergies:   Imdur [isosorbide nitrate]    Social History:  The patient  reports that he has never smoked. He has never used smokeless tobacco. He reports that he does not drink alcohol or use drugs.   Family History:  The patient's family history includes Heart disease in his father; Kidney disease in his mother.    ROS: All other systems are reviewed and negative. Unless otherwise mentioned in H&P    PHYSICAL EXAM: VS:  BP 132/60   Pulse 73   Ht 5\' 6"  (1.676 m)   Wt 204 lb 6.4 oz (92.7 kg)   SpO2 98%   BMI 32.99 kg/m  , BMI Body mass index is 32.99 kg/m. GEN: Well nourished, well developed, in no acute distress HEENT: normal Neck: no JVD, carotid bruits,  or masses Cardiac: RRR; soft systolic murmurs, rubs, or gallops,no edema  Respiratory:  Clear to auscultation bilaterally, normal work of breathing GI: soft, nontender, nondistended, + BS MS: no deformity or atrophy Skin: warm and dry, no rash Neuro:  Strength and sensation are intact Psych: euthymic mood, full affect   EKG: Not completed this office visit.    Recent Labs: 08/29/2018: BUN 21; Creatinine, Ser 1.18; Hemoglobin 14.0; Platelets 174; Potassium 4.6; Sodium 136    Lipid Panel No results found for: CHOL, TRIG, HDL, CHOLHDL, VLDL, LDLCALC, LDLDIRECT    Wt Readings from Last 3 Encounters:  10/07/18 204 lb 6.4 oz (92.7 kg)  09/11/18 204 lb 12.8 oz (92.9 kg)  08/28/18 204 lb 6.4 oz (92.7 kg)      Other studies Reviewed: Left ventricle: The cavity size was normal. Wall thickness was   increased in a pattern of mild LVH. Systolic function was normal.   The estimated ejection fraction was in the range of 55% to 60%.   There is mild hypokinesis of the apical anteroseptal myocardium,   possibly related to RV pacing. Indeterminate diastolic function. - Aortic valve: History of Bental procedure with 23 mm Edwards   pericardial valve along with 77mm Gelweave Valsalva ascending   aorta graft. Transvalvular velocity is in normal range. - Mitral valve: Mildly calcified annulus. There was trivial   regurgitation. - Left atrium: The atrium was mildly to moderately dilated. - Right ventricle: Pacer wire or catheter noted in right ventricle. - Right atrium: The atrium was at the upper limits of normal in   size. - Atrial septum: No defect or patent foramen ovale was identified. - Tricuspid valve: There was trivial regurgitation. - Pulmonary arteries: PA peak pressure: 23 mm Hg (S). - Pericardium, extracardiac: A prominent pericardial fat pad was   present.  ASSESSMENT AND PLAN:  1. CAD: Hx of CABG in 2013.Marland Kitchen Abnormal stress test revealing a medium defect of mild severity  present in the  mid infero, mid inferolateral and apical inferior location. EF of 16%. A repeat echo for accuracy was completed revealing normal LVEF which was reassuring. He is without complaints of chest pain. Continue current medication regimen.   2. PAF: Heart rate is regular and well controlled. He is followed in the [place] office for coumadin dosing. Has appointment tomorrow.   3. CHB: S/P Medtronic DDD PPM in situ. Followed by Dr. Lovena Le.   4. AAA: S/P Bental procedure 2013 with CABG.  .  Current medicines are reviewed at length with the patient today.    Labs/ tests ordered today include: None   Phill Myron. West Pugh, ANP, AACC   10/07/2018 10:12 AM    [place] [address] Office [phone] Fax [phone]

## 2018-10-07 ENCOUNTER — Ambulatory Visit (INDEPENDENT_AMBULATORY_CARE_PROVIDER_SITE_OTHER): Payer: Medicare Other | Admitting: Adult Health

## 2018-10-07 ENCOUNTER — Encounter: Payer: Self-pay | Admitting: Adult Health

## 2018-10-07 VITALS — BP 132/60 | HR 73 | Ht 66.0 in | Wt 204.4 lb

## 2018-10-07 DIAGNOSIS — I2510 Atherosclerotic heart disease of native coronary artery without angina pectoris: Secondary | ICD-10-CM | POA: Diagnosis not present

## 2018-10-07 DIAGNOSIS — Z950 Presence of cardiac pacemaker: Secondary | ICD-10-CM

## 2018-10-07 DIAGNOSIS — I480 Paroxysmal atrial fibrillation: Secondary | ICD-10-CM

## 2018-10-07 NOTE — Patient Instructions (Signed)
Follow-Up: You will need a follow up appointment in 6 months.  Please call our office 2 months in advance  (April 2020)  to schedule this  (June 2020)  appointment.  You may see Rozann Lesches, MD or one of the following Advanced Practice Providers on your designated Care Team:  Bernerd Pho, PA-C ([place])   Ermalinda Barrios, PA-C ([place])   Medication Instructions:  NO CHANGES- Your physician recommends that you continue on your current medications as directed. Please refer to the Current Medication list given to you today. If you need a refill on your cardiac medications before your next appointment, please call your pharmacy.  Labwork: When you have labs (blood work) and your tests are completely normal, you will receive your results ONLY by [place] (if you have MyChart) -OR- A paper copy in the mail.  At [facility], you and your health needs are our priority.  As part of our continuing mission to provide you with exceptional heart care, we have created designated Provider Care Teams.  These Care Teams include your primary Cardiologist (physician) and Advanced Practice Providers (APPs -  Physician Assistants and Nurse Practitioners) who all work together to provide you with the care you need, when you need it.  Thank you for choosing [facility] at [facility]!!

## 2018-10-08 ENCOUNTER — Ambulatory Visit (INDEPENDENT_AMBULATORY_CARE_PROVIDER_SITE_OTHER): Payer: Medicare Other | Admitting: *Deleted

## 2018-10-08 DIAGNOSIS — I4891 Unspecified atrial fibrillation: Secondary | ICD-10-CM | POA: Diagnosis not present

## 2018-10-08 DIAGNOSIS — Z5181 Encounter for therapeutic drug level monitoring: Secondary | ICD-10-CM | POA: Diagnosis not present

## 2018-10-08 DIAGNOSIS — Z952 Presence of prosthetic heart valve: Secondary | ICD-10-CM | POA: Diagnosis not present

## 2018-10-08 LAB — POCT INR: INR: 2.4 (ref 2.0–3.0)

## 2018-10-08 NOTE — Patient Instructions (Signed)
Continue coumadin 1 tablet daily  Continue greens Recheck in 6 weeks 

## 2018-10-10 DIAGNOSIS — N183 Chronic kidney disease, stage 3 (moderate): Secondary | ICD-10-CM | POA: Diagnosis not present

## 2018-10-10 DIAGNOSIS — Z79899 Other long term (current) drug therapy: Secondary | ICD-10-CM | POA: Diagnosis not present

## 2018-10-10 DIAGNOSIS — I5032 Chronic diastolic (congestive) heart failure: Secondary | ICD-10-CM | POA: Diagnosis not present

## 2018-10-13 ENCOUNTER — Ambulatory Visit (INDEPENDENT_AMBULATORY_CARE_PROVIDER_SITE_OTHER): Payer: Medicare Other

## 2018-10-13 DIAGNOSIS — I442 Atrioventricular block, complete: Secondary | ICD-10-CM | POA: Diagnosis not present

## 2018-10-14 LAB — CUP PACEART REMOTE DEVICE CHECK
Battery Impedance: 804 Ohm
Battery Voltage: 2.77 V
Brady Statistic AP VS Percent: 0 %
Implantable Lead Implant Date: 20130412
Implantable Lead Location: 753859
Implantable Lead Model: 5076
Implantable Pulse Generator Implant Date: 20130412
Lead Channel Impedance Value: 486 Ohm
Lead Channel Setting Pacing Amplitude: 2 V
Lead Channel Setting Sensing Sensitivity: 4 mV
MDC IDC LEAD IMPLANT DT: 20130412
MDC IDC LEAD LOCATION: 753860
MDC IDC MSMT BATTERY REMAINING LONGEVITY: 70 mo
MDC IDC MSMT LEADCHNL RV IMPEDANCE VALUE: 603 Ohm
MDC IDC MSMT LEADCHNL RV PACING THRESHOLD AMPLITUDE: 0.5 V
MDC IDC MSMT LEADCHNL RV PACING THRESHOLD PULSEWIDTH: 0.4 ms
MDC IDC SESS DTM: 20191223185205
MDC IDC SET LEADCHNL RV PACING AMPLITUDE: 2 V
MDC IDC SET LEADCHNL RV PACING PULSEWIDTH: 0.4 ms
MDC IDC STAT BRADY AP VP PERCENT: 55 %
MDC IDC STAT BRADY AS VP PERCENT: 44 %
MDC IDC STAT BRADY AS VS PERCENT: 0 %

## 2018-10-16 NOTE — Progress Notes (Signed)
Remote pacemaker transmission.   

## 2018-10-17 DIAGNOSIS — I5032 Chronic diastolic (congestive) heart failure: Secondary | ICD-10-CM | POA: Diagnosis not present

## 2018-10-17 DIAGNOSIS — I480 Paroxysmal atrial fibrillation: Secondary | ICD-10-CM | POA: Diagnosis not present

## 2018-10-17 DIAGNOSIS — I442 Atrioventricular block, complete: Secondary | ICD-10-CM | POA: Diagnosis not present

## 2018-11-19 ENCOUNTER — Ambulatory Visit (INDEPENDENT_AMBULATORY_CARE_PROVIDER_SITE_OTHER): Payer: Medicare Other | Admitting: Pharmacist

## 2018-11-19 DIAGNOSIS — Z952 Presence of prosthetic heart valve: Secondary | ICD-10-CM | POA: Diagnosis not present

## 2018-11-19 DIAGNOSIS — I4891 Unspecified atrial fibrillation: Secondary | ICD-10-CM | POA: Diagnosis not present

## 2018-11-19 LAB — POCT INR: INR: 2.6 (ref 2.0–3.0)

## 2018-11-19 NOTE — Patient Instructions (Signed)
Description   Continue coumadin 1 tablet daily  Continue greens Recheck in 6 weeks

## 2018-12-31 ENCOUNTER — Other Ambulatory Visit: Payer: Self-pay

## 2018-12-31 ENCOUNTER — Ambulatory Visit (INDEPENDENT_AMBULATORY_CARE_PROVIDER_SITE_OTHER): Payer: Medicare Other | Admitting: *Deleted

## 2018-12-31 DIAGNOSIS — I4891 Unspecified atrial fibrillation: Secondary | ICD-10-CM

## 2018-12-31 DIAGNOSIS — Z952 Presence of prosthetic heart valve: Secondary | ICD-10-CM

## 2018-12-31 DIAGNOSIS — I480 Paroxysmal atrial fibrillation: Secondary | ICD-10-CM

## 2018-12-31 DIAGNOSIS — Z5181 Encounter for therapeutic drug level monitoring: Secondary | ICD-10-CM | POA: Diagnosis not present

## 2018-12-31 LAB — POCT INR: INR: 2 (ref 2.0–3.0)

## 2018-12-31 NOTE — Patient Instructions (Signed)
Continue coumadin 1 tablet daily  Continue greens Recheck in 6 weeks 

## 2019-01-12 ENCOUNTER — Ambulatory Visit (INDEPENDENT_AMBULATORY_CARE_PROVIDER_SITE_OTHER): Payer: Medicare Other | Admitting: *Deleted

## 2019-01-12 ENCOUNTER — Other Ambulatory Visit: Payer: Self-pay

## 2019-01-12 DIAGNOSIS — I4891 Unspecified atrial fibrillation: Secondary | ICD-10-CM

## 2019-01-12 DIAGNOSIS — I442 Atrioventricular block, complete: Secondary | ICD-10-CM

## 2019-01-13 LAB — CUP PACEART REMOTE DEVICE CHECK
Battery Impedance: 908 Ohm
Battery Voltage: 2.76 V
Brady Statistic AP VP Percent: 55 %
Brady Statistic AP VS Percent: 0 %
Brady Statistic AS VS Percent: 0 %
Implantable Lead Implant Date: 20130412
Implantable Lead Location: 753859
Implantable Lead Model: 5076
Implantable Lead Model: 5076
Implantable Pulse Generator Implant Date: 20130412
Lead Channel Impedance Value: 479 Ohm
Lead Channel Impedance Value: 570 Ohm
Lead Channel Setting Pacing Amplitude: 2 V
Lead Channel Setting Pacing Pulse Width: 0.4 ms
Lead Channel Setting Sensing Sensitivity: 4 mV
MDC IDC LEAD IMPLANT DT: 20130412
MDC IDC LEAD LOCATION: 753860
MDC IDC MSMT BATTERY REMAINING LONGEVITY: 65 mo
MDC IDC MSMT LEADCHNL RV PACING THRESHOLD AMPLITUDE: 0.625 V
MDC IDC MSMT LEADCHNL RV PACING THRESHOLD PULSEWIDTH: 0.4 ms
MDC IDC SESS DTM: 20200323134550
MDC IDC SET LEADCHNL RV PACING AMPLITUDE: 2 V
MDC IDC STAT BRADY AS VP PERCENT: 45 %

## 2019-01-21 NOTE — Progress Notes (Signed)
Remote pacemaker transmission.   

## 2019-02-11 ENCOUNTER — Ambulatory Visit (INDEPENDENT_AMBULATORY_CARE_PROVIDER_SITE_OTHER): Payer: Medicare Other | Admitting: *Deleted

## 2019-02-11 DIAGNOSIS — Z5181 Encounter for therapeutic drug level monitoring: Secondary | ICD-10-CM

## 2019-02-11 DIAGNOSIS — Z952 Presence of prosthetic heart valve: Secondary | ICD-10-CM | POA: Diagnosis not present

## 2019-02-11 DIAGNOSIS — I4891 Unspecified atrial fibrillation: Secondary | ICD-10-CM | POA: Diagnosis not present

## 2019-02-11 LAB — POCT INR: INR: 2.5 (ref 2.0–3.0)

## 2019-02-11 NOTE — Patient Instructions (Signed)
Continue coumadin 1 tablet daily  Continue greens Recheck in 6 weeks

## 2019-03-05 DIAGNOSIS — M109 Gout, unspecified: Secondary | ICD-10-CM | POA: Diagnosis not present

## 2019-03-05 DIAGNOSIS — I5032 Chronic diastolic (congestive) heart failure: Secondary | ICD-10-CM | POA: Diagnosis not present

## 2019-03-05 DIAGNOSIS — I700 Atherosclerosis of aorta: Secondary | ICD-10-CM | POA: Diagnosis not present

## 2019-03-05 DIAGNOSIS — N183 Chronic kidney disease, stage 3 (moderate): Secondary | ICD-10-CM | POA: Diagnosis not present

## 2019-03-05 DIAGNOSIS — E785 Hyperlipidemia, unspecified: Secondary | ICD-10-CM | POA: Diagnosis not present

## 2019-03-05 DIAGNOSIS — I2510 Atherosclerotic heart disease of native coronary artery without angina pectoris: Secondary | ICD-10-CM | POA: Diagnosis not present

## 2019-03-05 DIAGNOSIS — M1990 Unspecified osteoarthritis, unspecified site: Secondary | ICD-10-CM | POA: Diagnosis not present

## 2019-03-05 DIAGNOSIS — I10 Essential (primary) hypertension: Secondary | ICD-10-CM | POA: Diagnosis not present

## 2019-03-05 DIAGNOSIS — I480 Paroxysmal atrial fibrillation: Secondary | ICD-10-CM | POA: Diagnosis not present

## 2019-03-12 DIAGNOSIS — I480 Paroxysmal atrial fibrillation: Secondary | ICD-10-CM | POA: Diagnosis not present

## 2019-03-12 DIAGNOSIS — N183 Chronic kidney disease, stage 3 (moderate): Secondary | ICD-10-CM | POA: Diagnosis not present

## 2019-03-12 DIAGNOSIS — E785 Hyperlipidemia, unspecified: Secondary | ICD-10-CM | POA: Diagnosis not present

## 2019-03-12 DIAGNOSIS — I442 Atrioventricular block, complete: Secondary | ICD-10-CM | POA: Diagnosis not present

## 2019-03-12 DIAGNOSIS — I5032 Chronic diastolic (congestive) heart failure: Secondary | ICD-10-CM | POA: Diagnosis not present

## 2019-03-12 DIAGNOSIS — I700 Atherosclerosis of aorta: Secondary | ICD-10-CM | POA: Diagnosis not present

## 2019-03-30 ENCOUNTER — Ambulatory Visit (INDEPENDENT_AMBULATORY_CARE_PROVIDER_SITE_OTHER): Payer: Medicare Other | Admitting: *Deleted

## 2019-03-30 DIAGNOSIS — Z5181 Encounter for therapeutic drug level monitoring: Secondary | ICD-10-CM

## 2019-03-30 DIAGNOSIS — I4891 Unspecified atrial fibrillation: Secondary | ICD-10-CM

## 2019-03-30 DIAGNOSIS — Z952 Presence of prosthetic heart valve: Secondary | ICD-10-CM

## 2019-03-30 LAB — POCT INR: INR: 3.2 — AB (ref 2.0–3.0)

## 2019-03-30 NOTE — Patient Instructions (Signed)
Take coumadin 1/2 tablet tonight then resume 1 tablet daily  Continue greens Recheck in 6 weeks

## 2019-04-04 ENCOUNTER — Other Ambulatory Visit: Payer: Self-pay | Admitting: Cardiology

## 2019-04-13 ENCOUNTER — Ambulatory Visit (INDEPENDENT_AMBULATORY_CARE_PROVIDER_SITE_OTHER): Payer: Medicare Other | Admitting: *Deleted

## 2019-04-13 DIAGNOSIS — I442 Atrioventricular block, complete: Secondary | ICD-10-CM | POA: Diagnosis not present

## 2019-04-13 DIAGNOSIS — I4891 Unspecified atrial fibrillation: Secondary | ICD-10-CM

## 2019-04-13 LAB — CUP PACEART REMOTE DEVICE CHECK
Battery Impedance: 986 Ohm
Battery Remaining Longevity: 61 mo
Battery Voltage: 2.76 V
Brady Statistic AP VP Percent: 55 %
Brady Statistic AP VS Percent: 0 %
Brady Statistic AS VP Percent: 45 %
Brady Statistic AS VS Percent: 0 %
Date Time Interrogation Session: 20200622135055
Implantable Lead Implant Date: 20130412
Implantable Lead Implant Date: 20130412
Implantable Lead Location: 753859
Implantable Lead Location: 753860
Implantable Lead Model: 5076
Implantable Lead Model: 5076
Implantable Pulse Generator Implant Date: 20130412
Lead Channel Impedance Value: 465 Ohm
Lead Channel Impedance Value: 555 Ohm
Lead Channel Pacing Threshold Amplitude: 0.625 V
Lead Channel Pacing Threshold Pulse Width: 0.4 ms
Lead Channel Setting Pacing Amplitude: 2 V
Lead Channel Setting Pacing Amplitude: 2 V
Lead Channel Setting Pacing Pulse Width: 0.4 ms
Lead Channel Setting Sensing Sensitivity: 4 mV

## 2019-04-23 NOTE — Progress Notes (Signed)
Remote pacemaker transmission.   

## 2019-04-29 DIAGNOSIS — D1801 Hemangioma of skin and subcutaneous tissue: Secondary | ICD-10-CM | POA: Diagnosis not present

## 2019-04-29 DIAGNOSIS — Z85828 Personal history of other malignant neoplasm of skin: Secondary | ICD-10-CM | POA: Diagnosis not present

## 2019-04-29 DIAGNOSIS — L814 Other melanin hyperpigmentation: Secondary | ICD-10-CM | POA: Diagnosis not present

## 2019-04-29 DIAGNOSIS — L570 Actinic keratosis: Secondary | ICD-10-CM | POA: Diagnosis not present

## 2019-04-29 DIAGNOSIS — D225 Melanocytic nevi of trunk: Secondary | ICD-10-CM | POA: Diagnosis not present

## 2019-04-29 DIAGNOSIS — L821 Other seborrheic keratosis: Secondary | ICD-10-CM | POA: Diagnosis not present

## 2019-05-11 ENCOUNTER — Ambulatory Visit (INDEPENDENT_AMBULATORY_CARE_PROVIDER_SITE_OTHER): Payer: Medicare Other | Admitting: *Deleted

## 2019-05-11 ENCOUNTER — Other Ambulatory Visit: Payer: Self-pay

## 2019-05-11 DIAGNOSIS — Z952 Presence of prosthetic heart valve: Secondary | ICD-10-CM

## 2019-05-11 DIAGNOSIS — I4891 Unspecified atrial fibrillation: Secondary | ICD-10-CM | POA: Diagnosis not present

## 2019-05-11 DIAGNOSIS — Z5181 Encounter for therapeutic drug level monitoring: Secondary | ICD-10-CM | POA: Diagnosis not present

## 2019-05-11 LAB — POCT INR: INR: 3 (ref 2.0–3.0)

## 2019-05-11 NOTE — Patient Instructions (Signed)
Continue coumadin 1 tablet daily  Continue greens Recheck in 6 weeks

## 2019-05-22 ENCOUNTER — Telehealth: Payer: Self-pay | Admitting: Cardiology

## 2019-05-22 NOTE — Telephone Encounter (Signed)
With increasing symptoms and active chest pain at this time, I would agree that ER assessment would make the most sense.

## 2019-05-22 NOTE — Telephone Encounter (Signed)
Spoke with Pt who reports Chest pain ,DOE for one week and denies N/V at this time. Pt rates pain 5/10 at this time. Current wt. Is 201 lb. Encouraged patient to be seen in ER today. Pt refused and stated that " I'm not going unless it gets worse". Again encouraged pt to be seen. Pt refused and states he will be seen in office a next office visit. Please advise.

## 2019-05-22 NOTE — Telephone Encounter (Signed)
Pt is having SOB and some Chest Tightness when he moves and walks around. States he has to rest whenever he's up doing something and he sounded  short of breath on the phone while speaking with him   Please give pt a call [phone]

## 2019-05-25 NOTE — Progress Notes (Deleted)
Cardiology Office Note   Date:  05/25/2019   ID:  [name], Douglas [name] 12-12-1927, MRN [id number]  PCP:  [name], MD  Cardiologist:  Mr. Olund  No chief complaint on file.    History of Present Illness: Douglas [name] is a 85 y.o. male who presents for ongoing assessment and management of CAD, hx of CABG in 2013with LIMA to LAD, SVG to OM, SV to PDA), CHB requiring a PPM (Medtronic DDD) atypical chest pain, thought to be GI in etiology, AAArepair, s/p Bentall procedurewith biologic valve,PAF on coumadin, and hypercholesterolemia.  He has had a NM stress test revealing a medium defect of mild severity present in the mid infero, mid inferolateral and apical inferior location. EF of 16%.   An echocardiogram was ordered for accuracy of LVEF as a cardiac cath was not recommended at that time, as he was completley asymptomatic.  Echo on 09/22/2018 revealed normal LVEF of 55%-60%. Mild hypokinesis of the apical anteroseptal myocardium possibly related to RV pacing.   He called our office on 05/22/2019 complaining of shortness of breath and chest tightness when he moves or walks around. He was advised to go to ED for further assessment.   Past Medical History:  Diagnosis Date  . Acute upper GI bleed 05/18/2016  . Aneurysm of thoracic aorta (HCC)    Ascending. 5.7 x 5.5cm - Bentall procedure  . Aortic stenosis    Bioprosthetic AVR  . Aortic valve replaced 08/25/2012  . Arthritis   . Atrial fibrillation ([place])    Documented on pacer interrogation 6/13  . CHB (complete heart block) ([place]) 01/31/2012  . Complete heart block ([place])    Status post pacemaker placement  . Coronary atherosclerosis of native coronary artery    Multivessel - LIMA to LAD, SVG to OM, SVG to PDA  . DJD (degenerative joint disease)   . Esophageal dysmotility   . Essential hypertension, benign   . Fibromyalgia   . Gout   . H/O arteriovenous malformation (AVM)   . H/O hiatal hernia   . Hemorrhoid 12/11/2017  . History of  esophageal ulcer   . History of skin cancer   . Hx of adenomatous colonic polyps   . Iron deficiency anemia due to chronic blood loss 10/03/2016  . Melena   . Pacemaker-Medtronic 07/29/2012  . PMR (polymyalgia rheumatica) (HCC)   . Precordial pain 06/15/2014  . Presence of permanent cardiac pacemaker   . SIRS (systemic inflammatory response syndrome) ([place]) 01/16/2015    Past Surgical History:  Procedure Laterality Date  . AORTIC VALVE REPLACEMENT  01/24/12  . APPENDECTOMY    . [place] or 1966 lumbar back surgery  . BENTALL PROCEDURE  01/24/2012   Procedure: BENTALL PROCEDURE;  Surgeon: Ivin Poot, MD;  Location: [place];  Service: Open Heart Surgery;  Laterality: N/A;  . BILATERAL KNEE ARTHROSCOPY    . CATARACT EXTRACTION W/PHACO Left 12/03/2016   Procedure: CATARACT EXTRACTION PHACO AND INTRAOCULAR LENS PLACEMENT LEFT EYE;  Surgeon: Tonny Branch, MD;  [facility];  Service: Ophthalmology;  Laterality: Left;  CDE:  10.87  . CATARACT EXTRACTION W/PHACO Right 12/13/2016   Procedure: CATARACT EXTRACTION PHACO AND INTRAOCULAR LENS PLACEMENT (IOC);  Surgeon: Tonny Branch, MD;  Location: [facility];  Service: Ophthalmology;  Laterality: Right;  right cde 11.78  . CORONARY ARTERY BYPASS GRAFT  01/24/2012   Procedure: CORONARY ARTERY BYPASS GRAFTING (CABG);  Surgeon: Ivin Poot, MD;  Location: [place];  Service: Open Heart Surgery;  Laterality: N/A;  . ESOPHAGOGASTRODUODENOSCOPY  06/17/2012   Procedure: ESOPHAGOGASTRODUODENOSCOPY (EGD);  Surgeon: Rogene Houston, MD;  [facility];  Service: Endoscopy;  Laterality: N/A;  ED  . ESOPHAGOGASTRODUODENOSCOPY N/A 05/19/2016   Procedure: ESOPHAGOGASTRODUODENOSCOPY (EGD);  Surgeon: Danie Binder, MD;  [facility];  Service: Endoscopy;  Laterality: N/A;  . HEMORROIDECTOMY    . HERNIA REPAIR    . Left inguinal hernia repair    . PACEMAKER PLACEMENT     Medtronic 4/13 - Dr. Rayann Heman  . PERMANENT PACEMAKER INSERTION N/A  02/01/2012   Procedure: PERMANENT PACEMAKER INSERTION;  Surgeon: Thompson Grayer, MD; [place] [facility] (serial number [id number])       Current Outpatient Medications  Medication Sig Dispense Refill  . acetaminophen (TYLENOL) 500 MG tablet Take 1,000 mg by mouth every 6 (six) hours as needed for mild pain.    Marland Kitchen allopurinol (ZYLOPRIM) 300 MG tablet Take 450 mg by mouth daily. Take 1 1/2 daily    . furosemide (LASIX) 40 MG tablet Take 20 mg by mouth daily.    . metoprolol succinate (TOPROL-XL) 25 MG 24 hr tablet TAKE (2) TABLETS BY MOUTH DAILY. 60 tablet 10  . nitroGLYCERIN (NITROSTAT) 0.4 MG SL tablet Place 1 tablet (0.4 mg total) under the tongue every 5 (five) minutes as needed for chest pain. 30 tablet 3  . pantoprazole (PROTONIX) 40 MG tablet TAKE ONE TABLET BY MOUTH ONCE DAILY IN THE MORNING. 30 tablet 6  . pravastatin (PRAVACHOL) 20 MG tablet Take 20 mg by mouth every evening.   11  . sennosides-docusate sodium (SENOKOT-S) 8.6-50 MG tablet Take 1 tablet by mouth 2 (two) times daily.    Marland Kitchen warfarin (COUMADIN) 5 MG tablet Take 1 tablet daily or as directed 90 tablet 3   No current facility-administered medications for this visit.     Allergies:   Imdur [isosorbide nitrate]    Social History:  The patient  reports that he has never smoked. He has never used smokeless tobacco. He reports that he does not drink alcohol or use drugs.   Family History:  The patient's family history includes Heart disease in his father; Kidney disease in his mother.    ROS: All other systems are reviewed and negative. Unless otherwise mentioned in H&P    PHYSICAL EXAM: VS:  There were no vitals taken for this visit. , BMI There is no height or weight on file to calculate BMI. GEN: Well nourished, well developed, in no acute distress HEENT: normal Neck: no JVD, carotid bruits, or masses Cardiac: ***RRR; no murmurs, rubs, or gallops,no edema  Respiratory:  Clear to auscultation  bilaterally, normal work of breathing GI: soft, nontender, nondistended, + BS MS: no deformity or atrophy Skin: warm and dry, no rash Neuro:  Strength and sensation are intact Psych: euthymic mood, full affect   EKG:  EKG {ACTION; IS/IS [id number]} ordered today. The ekg ordered today demonstrates ***   Recent Labs: 08/29/2018: BUN 21; Creatinine, Ser 1.18; Hemoglobin 14.0; Platelets 174; Potassium 4.6; Sodium 136    Lipid Panel No results found for: CHOL, TRIG, HDL, CHOLHDL, VLDL, LDLCALC, LDLDIRECT    Wt Readings from Last 3 Encounters:  10/07/18 204 lb 6.4 oz (92.7 kg)  09/11/18 204 lb 12.8 oz (92.9 kg)  08/28/18 204 lb 6.4 oz (92.7 kg)      Other studies Reviewed: Echocardiogram 2018/10/18 Left ventricle: The cavity size was normal. Wall thickness  was   increased in a pattern of mild LVH. Systolic function was normal.   The estimated ejection fraction was in the range of 55% to 60%.   There is mild hypokinesis of the apical anteroseptal myocardium,   possibly related to RV pacing. Indeterminate diastolic function. - Aortic valve: History of Bental procedure with 23 mm Edwards   pericardial valve along with 72mm Gelweave Valsalva ascending   aorta graft. Transvalvular velocity is in normal range. - Mitral valve: Mildly calcified annulus. There was trivial   regurgitation. - Left atrium: The atrium was mildly to moderately dilated. - Right ventricle: Pacer wire or catheter noted in right ventricle. - Right atrium: The atrium was at the upper limits of normal in   size. - Atrial septum: No defect or patent foramen ovale was identified. - Tricuspid valve: There was trivial regurgitation. - Pulmonary arteries: PA peak pressure: 23 mm Hg (S). - Pericardium, extracardiac: A prominent pericardial fat pad was   present.  ASSESSMENT AND PLAN:  1.  ***   Current medicines are reviewed at length with the patient today.    Labs/ tests ordered today include: ***  Phill Myron. West Pugh, ANP, [facility]   05/25/2019 7:24 AM    [facility] [address] Office [phone] Fax [phone]

## 2019-05-26 ENCOUNTER — Ambulatory Visit: Payer: Medicare Other | Admitting: Adult Health

## 2019-05-26 ENCOUNTER — Other Ambulatory Visit: Payer: Self-pay | Admitting: Internal Medicine

## 2019-06-22 ENCOUNTER — Other Ambulatory Visit: Payer: Self-pay

## 2019-06-22 ENCOUNTER — Ambulatory Visit (INDEPENDENT_AMBULATORY_CARE_PROVIDER_SITE_OTHER): Payer: Medicare Other | Admitting: *Deleted

## 2019-06-22 DIAGNOSIS — I482 Chronic atrial fibrillation, unspecified: Secondary | ICD-10-CM

## 2019-06-22 DIAGNOSIS — Z5181 Encounter for therapeutic drug level monitoring: Secondary | ICD-10-CM | POA: Diagnosis not present

## 2019-06-22 DIAGNOSIS — I4891 Unspecified atrial fibrillation: Secondary | ICD-10-CM

## 2019-06-22 DIAGNOSIS — Z952 Presence of prosthetic heart valve: Secondary | ICD-10-CM

## 2019-06-22 LAB — POCT INR: INR: 2.6 (ref 2.0–3.0)

## 2019-06-22 NOTE — Patient Instructions (Signed)
Continue coumadin 1 tablet daily  Continue greens Recheck in 6 weeks 

## 2019-06-25 ENCOUNTER — Other Ambulatory Visit: Payer: Self-pay | Admitting: Internal Medicine

## 2019-07-06 DIAGNOSIS — I2510 Atherosclerotic heart disease of native coronary artery without angina pectoris: Secondary | ICD-10-CM | POA: Diagnosis not present

## 2019-07-06 DIAGNOSIS — Z79899 Other long term (current) drug therapy: Secondary | ICD-10-CM | POA: Diagnosis not present

## 2019-07-06 DIAGNOSIS — N183 Chronic kidney disease, stage 3 (moderate): Secondary | ICD-10-CM | POA: Diagnosis not present

## 2019-07-06 DIAGNOSIS — E785 Hyperlipidemia, unspecified: Secondary | ICD-10-CM | POA: Diagnosis not present

## 2019-07-06 DIAGNOSIS — I480 Paroxysmal atrial fibrillation: Secondary | ICD-10-CM | POA: Diagnosis not present

## 2019-07-06 DIAGNOSIS — M109 Gout, unspecified: Secondary | ICD-10-CM | POA: Diagnosis not present

## 2019-07-06 DIAGNOSIS — I10 Essential (primary) hypertension: Secondary | ICD-10-CM | POA: Diagnosis not present

## 2019-07-06 DIAGNOSIS — R7301 Impaired fasting glucose: Secondary | ICD-10-CM | POA: Diagnosis not present

## 2019-07-07 ENCOUNTER — Ambulatory Visit (INDEPENDENT_AMBULATORY_CARE_PROVIDER_SITE_OTHER): Payer: Medicare Other | Admitting: Internal Medicine

## 2019-07-07 ENCOUNTER — Other Ambulatory Visit: Payer: Self-pay

## 2019-07-07 ENCOUNTER — Encounter: Payer: Self-pay | Admitting: Internal Medicine

## 2019-07-07 VITALS — BP 127/89 | HR 77 | Temp 96.8°F | Ht 66.0 in | Wt 206.0 lb

## 2019-07-07 DIAGNOSIS — Z950 Presence of cardiac pacemaker: Secondary | ICD-10-CM | POA: Diagnosis not present

## 2019-07-07 DIAGNOSIS — I4891 Unspecified atrial fibrillation: Secondary | ICD-10-CM

## 2019-07-07 DIAGNOSIS — I442 Atrioventricular block, complete: Secondary | ICD-10-CM

## 2019-07-07 NOTE — Patient Instructions (Signed)
Medication Instructions: Your physician recommends that you continue on your current medications as directed. Please refer to the Current Medication list given to you today.   Labwork: None today  Procedures/Testing: None today  Follow-Up: 1 year with Dr.Taylor  Any Additional Special Instructions Will Be Listed Below (If Applicable).     If you need a refill on your cardiac medications before your next appointment, please call your pharmacy.     Thank you for choosing [facility] !        

## 2019-07-07 NOTE — Progress Notes (Signed)
HPI Mr. Alzate returns today for followup. He is a pleasant 85yo man with a h/o CHB, after AVR/Bentall with a tissue valve, s/p PPM, HTN and chronic coumadin therapy. In the interim, he has done well mostly. No sob. Minimal edema. He notes some chest pressure when he bends over or after eating. However has not noticed chest pressure when he walks although he is sedentary.  Allergies  Allergen Reactions  . Imdur [Isosorbide Nitrate] Other (See Comments)    Nausea and lightheaded      Current Outpatient Medications  Medication Sig Dispense Refill  . acetaminophen (TYLENOL) 500 MG tablet Take 1,000 mg by mouth every 6 (six) hours as needed for mild pain.    Marland Kitchen allopurinol (ZYLOPRIM) 300 MG tablet Take 450 mg by mouth daily. Take 1 1/2 daily    . furosemide (LASIX) 40 MG tablet Take 20 mg by mouth daily.    . metoprolol succinate (TOPROL-XL) 25 MG 24 hr tablet TAKE (2) TABLETS BY MOUTH DAILY. 60 tablet 6  . nitroGLYCERIN (NITROSTAT) 0.4 MG SL tablet Place 1 tablet (0.4 mg total) under the tongue every 5 (five) minutes as needed for chest pain. 30 tablet 3  . pantoprazole (PROTONIX) 40 MG tablet TAKE ONE TABLET BY MOUTH ONCE DAILY IN THE MORNING. 30 tablet 6  . pravastatin (PRAVACHOL) 20 MG tablet Take 20 mg by mouth every evening.   11  . sennosides-docusate sodium (SENOKOT-S) 8.6-50 MG tablet Take 1 tablet by mouth 2 (two) times daily.    Marland Kitchen warfarin (COUMADIN) 5 MG tablet Take 1 tablet daily or as directed 90 tablet 3   No current facility-administered medications for this visit.      Past Medical History:  Diagnosis Date  . Acute upper GI bleed 05/18/2016  . Aneurysm of thoracic aorta (HCC)    Ascending. 5.7 x 5.5cm - Bentall procedure  . Aortic stenosis    Bioprosthetic AVR  . Aortic valve replaced 08/25/2012  . Arthritis   . Atrial fibrillation ([place])    Documented on pacer interrogation 6/13  . CHB (complete heart block) ([place]) 01/31/2012  . Complete heart block ([place])    Status post pacemaker placement  . Coronary atherosclerosis of native coronary artery    Multivessel - LIMA to LAD, SVG to OM, SVG to PDA  . DJD (degenerative joint disease)   . Esophageal dysmotility   . Essential hypertension, benign   . Fibromyalgia   . Gout   . H/O arteriovenous malformation (AVM)   . H/O hiatal hernia   . Hemorrhoid 12/11/2017  . History of esophageal ulcer   . History of skin cancer   . Hx of adenomatous colonic polyps   . Iron deficiency anemia due to chronic blood loss 10/03/2016  . Melena   . Pacemaker-Medtronic 07/29/2012  . PMR (polymyalgia rheumatica) (HCC)   . Precordial pain 06/15/2014  . Presence of permanent cardiac pacemaker   . SIRS (systemic inflammatory response syndrome) ([place]) 01/16/2015    ROS:   All systems reviewed and negative except as noted in the HPI.   Past Surgical History:  Procedure Laterality Date  . AORTIC VALVE REPLACEMENT  01/24/12  . APPENDECTOMY    . [place] or 1966 lumbar back surgery  . BENTALL PROCEDURE  01/24/2012   Procedure: BENTALL PROCEDURE;  Surgeon: Ivin Poot, MD;  Location: [place];  Service: Open Heart Surgery;  Laterality: N/A;  . BILATERAL KNEE ARTHROSCOPY    .  CATARACT EXTRACTION W/PHACO Left 12/03/2016   Procedure: CATARACT EXTRACTION PHACO AND INTRAOCULAR LENS PLACEMENT LEFT EYE;  Surgeon: Tonny Branch, MD;  [facility];  Service: Ophthalmology;  Laterality: Left;  CDE:  10.87  . CATARACT EXTRACTION W/PHACO Right 12/13/2016   Procedure: CATARACT EXTRACTION PHACO AND INTRAOCULAR LENS PLACEMENT (IOC);  Surgeon: Tonny Branch, MD;  [facility];  Service: Ophthalmology;  Laterality: Right;  right cde 11.78  . CORONARY ARTERY BYPASS GRAFT  01/24/2012   Procedure: CORONARY ARTERY BYPASS GRAFTING (CABG);  Surgeon: Ivin Poot, MD;  Location: [place];  Service: Open Heart Surgery;  Laterality: N/A;  . ESOPHAGOGASTRODUODENOSCOPY  06/17/2012   Procedure: ESOPHAGOGASTRODUODENOSCOPY (EGD);  Surgeon:  Rogene Houston, MD;  [facility];  Service: Endoscopy;  Laterality: N/A;  ED  . ESOPHAGOGASTRODUODENOSCOPY N/A 05/19/2016   Procedure: ESOPHAGOGASTRODUODENOSCOPY (EGD);  Surgeon: Danie Binder, MD;  [facility];  Service: Endoscopy;  Laterality: N/A;  . HEMORROIDECTOMY    . HERNIA REPAIR    . Left inguinal hernia repair    . PACEMAKER PLACEMENT     Medtronic 4/13 - Dr. Rayann Heman  . PERMANENT PACEMAKER INSERTION N/A 02/01/2012   Procedure: PERMANENT PACEMAKER INSERTION;  Surgeon: Thompson Grayer, MD; [place] 1 (serial number [id number])       Family History  Problem Relation Age of Onset  . Kidney disease Mother   . Heart disease Father   . Anesthesia problems Neg Hx   . Hypotension Neg Hx   . Malignant hyperthermia Neg Hx   . Pseudochol deficiency Neg Hx      Social History   Socioeconomic History  . Marital status: Married    Spouse name: Not on file  . Number of children: 4  . Years of education: Not on file  . Highest education level: Not on file  Occupational History  . Occupation: Retired    Comment: Scientist, forensic  Social Needs  . Financial resource strain: Not on file  . Food insecurity    Worry: Not on file    Inability: Not on file  . Transportation needs    Medical: Not on file    Non-medical: Not on file  Tobacco Use  . Smoking status: Never Smoker  . Smokeless tobacco: Never Used  Substance and Sexual Activity  . Alcohol use: No    Alcohol/week: 0.0 standard drinks  . Drug use: No  . Sexual activity: Never    Birth control/protection: None  Lifestyle  . Physical activity    Days per week: Not on file    Minutes per session: Not on file  . Stress: Not on file  Relationships  . Social Herbalist on phone: Not on file    Gets together: Not on file    Attends religious service: Not on file    Active member of club or organization: Not on file    Attends meetings of clubs or organizations: Not on  file    Relationship status: Not on file  . Intimate partner violence    Fear of current or ex partner: Not on file    Emotionally abused: Not on file    Physically abused: Not on file    Forced sexual activity: Not on file  Other Topics Concern  . Not on file  Social History Narrative   Lives with wife, has been married since 63.     BP 127/89   Pulse 77  Temp (!) 96.8 F (36 C) (Temporal)   Ht 5\' 6"  (1.676 m)   Wt 206 lb (93.4 kg)   SpO2 98%   BMI 33.25 kg/m   Physical Exam:  Well appearing NAD HEENT: Unremarkable Neck:  No JVD, no thyromegally Lymphatics:  No adenopathy Back:  No CVA tenderness Lungs:  Clear with no wheezes HEART:  Regular rate rhythm, no murmurs, no rubs, no clicks Abd:  soft, positive bowel sounds, no organomegally, no rebound, no guarding Ext:  2 plus pulses, no edema, no cyanosis, no clubbing Skin:  No rashes no nodules Neuro:  CN II through XII intact, motor grossly intact   DEVICE  Normal device function.  See PaceArt for details.   Assess/Plan: 1. CHB - he is asymptomatic, s/p PPM insertion. 2. PPM - his medtronic DDD PPM was reprogrammed from [place] to DDDR and we adjusted his mode switch rate. 3. CAD - he denies anginal symptoms. We discussed the importance of exertion and symptoms or lack there of.  Mikle Bosworth.D.

## 2019-07-13 DIAGNOSIS — I5032 Chronic diastolic (congestive) heart failure: Secondary | ICD-10-CM | POA: Diagnosis not present

## 2019-07-13 DIAGNOSIS — N183 Chronic kidney disease, stage 3 (moderate): Secondary | ICD-10-CM | POA: Diagnosis not present

## 2019-07-13 DIAGNOSIS — K449 Diaphragmatic hernia without obstruction or gangrene: Secondary | ICD-10-CM | POA: Diagnosis not present

## 2019-07-13 DIAGNOSIS — I480 Paroxysmal atrial fibrillation: Secondary | ICD-10-CM | POA: Diagnosis not present

## 2019-07-14 ENCOUNTER — Ambulatory Visit (INDEPENDENT_AMBULATORY_CARE_PROVIDER_SITE_OTHER): Payer: Medicare Other | Admitting: *Deleted

## 2019-07-14 DIAGNOSIS — I482 Chronic atrial fibrillation, unspecified: Secondary | ICD-10-CM

## 2019-07-14 DIAGNOSIS — I442 Atrioventricular block, complete: Secondary | ICD-10-CM | POA: Diagnosis not present

## 2019-07-14 LAB — CUP PACEART REMOTE DEVICE CHECK
Battery Impedance: 1097 Ohm
Battery Remaining Longevity: 56 mo
Battery Voltage: 2.76 V
Brady Statistic AP VP Percent: 85 %
Brady Statistic AP VS Percent: 0 %
Brady Statistic AS VP Percent: 15 %
Brady Statistic AS VS Percent: 0 %
Date Time Interrogation Session: 20200922132004
Implantable Lead Implant Date: 20130412
Implantable Lead Implant Date: 20130412
Implantable Lead Location: 753859
Implantable Lead Location: 753860
Implantable Lead Model: 5076
Implantable Lead Model: 5076
Implantable Pulse Generator Implant Date: 20130412
Lead Channel Impedance Value: 473 Ohm
Lead Channel Impedance Value: 571 Ohm
Lead Channel Pacing Threshold Amplitude: 0.5 V
Lead Channel Pacing Threshold Amplitude: 0.625 V
Lead Channel Pacing Threshold Pulse Width: 0.4 ms
Lead Channel Pacing Threshold Pulse Width: 0.4 ms
Lead Channel Setting Pacing Amplitude: 1.625
Lead Channel Setting Pacing Amplitude: 2 V
Lead Channel Setting Pacing Pulse Width: 0.4 ms
Lead Channel Setting Sensing Sensitivity: 4 mV

## 2019-07-15 DIAGNOSIS — M79642 Pain in left hand: Secondary | ICD-10-CM | POA: Diagnosis not present

## 2019-07-17 ENCOUNTER — Ambulatory Visit
Admission: RE | Admit: 2019-07-17 | Discharge: 2019-07-17 | Disposition: A | Discharge status: Home / Self Care | Payer: Medicare Other | Source: Ambulatory Visit | Attending: Orthopedic Surgery | Admitting: Orthopedic Surgery

## 2019-07-17 ENCOUNTER — Other Ambulatory Visit: Payer: Self-pay

## 2019-07-17 ENCOUNTER — Other Ambulatory Visit: Payer: Self-pay | Admitting: Orthopedic Surgery

## 2019-07-17 DIAGNOSIS — S60222A Contusion of left hand, initial encounter: Secondary | ICD-10-CM | POA: Diagnosis not present

## 2019-07-17 DIAGNOSIS — S6292XA Unspecified fracture of left wrist and hand, initial encounter for closed fracture: Secondary | ICD-10-CM

## 2019-07-17 DIAGNOSIS — M79642 Pain in left hand: Secondary | ICD-10-CM | POA: Diagnosis not present

## 2019-07-22 DIAGNOSIS — M79642 Pain in left hand: Secondary | ICD-10-CM | POA: Diagnosis not present

## 2019-07-23 DIAGNOSIS — M7981 Nontraumatic hematoma of soft tissue: Secondary | ICD-10-CM | POA: Diagnosis not present

## 2019-07-27 NOTE — Progress Notes (Signed)
Remote pacemaker transmission.   

## 2019-07-29 DIAGNOSIS — M79642 Pain in left hand: Secondary | ICD-10-CM | POA: Diagnosis not present

## 2019-07-30 DIAGNOSIS — Z23 Encounter for immunization: Secondary | ICD-10-CM | POA: Diagnosis not present

## 2019-08-03 ENCOUNTER — Other Ambulatory Visit: Payer: Self-pay

## 2019-08-03 ENCOUNTER — Ambulatory Visit (INDEPENDENT_AMBULATORY_CARE_PROVIDER_SITE_OTHER): Payer: Medicare Other | Admitting: *Deleted

## 2019-08-03 DIAGNOSIS — I4891 Unspecified atrial fibrillation: Secondary | ICD-10-CM | POA: Diagnosis not present

## 2019-08-03 DIAGNOSIS — Z952 Presence of prosthetic heart valve: Secondary | ICD-10-CM

## 2019-08-03 DIAGNOSIS — Z5181 Encounter for therapeutic drug level monitoring: Secondary | ICD-10-CM | POA: Diagnosis not present

## 2019-08-03 LAB — POCT INR: INR: 1.7 — AB (ref 2.0–3.0)

## 2019-08-03 NOTE — Patient Instructions (Signed)
Has 2 days prednisone left.  Was off coumadin 5 days 1 week ago due to fall. Take 1 1/2 tablets tonight then resume 1 tablet daily  Continue greens Recheck in 3 weeks

## 2019-08-10 DIAGNOSIS — S60222A Contusion of left hand, initial encounter: Secondary | ICD-10-CM | POA: Diagnosis not present

## 2019-08-12 DIAGNOSIS — M79642 Pain in left hand: Secondary | ICD-10-CM | POA: Diagnosis not present

## 2019-08-12 DIAGNOSIS — T148XXA Other injury of unspecified body region, initial encounter: Secondary | ICD-10-CM | POA: Insufficient documentation

## 2019-08-14 DIAGNOSIS — M79642 Pain in left hand: Secondary | ICD-10-CM | POA: Diagnosis not present

## 2019-08-17 DIAGNOSIS — M79642 Pain in left hand: Secondary | ICD-10-CM | POA: Diagnosis not present

## 2019-08-21 DIAGNOSIS — M79642 Pain in left hand: Secondary | ICD-10-CM | POA: Diagnosis not present

## 2019-08-24 ENCOUNTER — Ambulatory Visit (INDEPENDENT_AMBULATORY_CARE_PROVIDER_SITE_OTHER): Payer: Medicare Other | Admitting: *Deleted

## 2019-08-24 ENCOUNTER — Other Ambulatory Visit: Payer: Self-pay

## 2019-08-24 DIAGNOSIS — Z5181 Encounter for therapeutic drug level monitoring: Secondary | ICD-10-CM

## 2019-08-24 DIAGNOSIS — I4891 Unspecified atrial fibrillation: Secondary | ICD-10-CM

## 2019-08-24 DIAGNOSIS — Z952 Presence of prosthetic heart valve: Secondary | ICD-10-CM | POA: Diagnosis not present

## 2019-08-24 LAB — POCT INR: INR: 2.2 (ref 2.0–3.0)

## 2019-08-24 NOTE — Patient Instructions (Signed)
On antibiotic for skin incision.  Don't know the name but finishes Wednesday. Continue warfarin 1 tablet daily  Continue greens Recheck in 4 weeks

## 2019-08-25 DIAGNOSIS — M79642 Pain in left hand: Secondary | ICD-10-CM | POA: Diagnosis not present

## 2019-08-27 DIAGNOSIS — M79642 Pain in left hand: Secondary | ICD-10-CM | POA: Diagnosis not present

## 2019-09-01 DIAGNOSIS — M79642 Pain in left hand: Secondary | ICD-10-CM | POA: Diagnosis not present

## 2019-09-02 DIAGNOSIS — T148XXD Other injury of unspecified body region, subsequent encounter: Secondary | ICD-10-CM | POA: Diagnosis not present

## 2019-09-02 DIAGNOSIS — M79642 Pain in left hand: Secondary | ICD-10-CM | POA: Diagnosis not present

## 2019-09-03 DIAGNOSIS — M79642 Pain in left hand: Secondary | ICD-10-CM | POA: Diagnosis not present

## 2019-09-08 DIAGNOSIS — M79642 Pain in left hand: Secondary | ICD-10-CM | POA: Diagnosis not present

## 2019-09-16 DIAGNOSIS — M79642 Pain in left hand: Secondary | ICD-10-CM | POA: Diagnosis not present

## 2019-09-21 ENCOUNTER — Ambulatory Visit (INDEPENDENT_AMBULATORY_CARE_PROVIDER_SITE_OTHER): Payer: Medicare Other | Admitting: *Deleted

## 2019-09-21 ENCOUNTER — Other Ambulatory Visit: Payer: Self-pay

## 2019-09-21 DIAGNOSIS — Z952 Presence of prosthetic heart valve: Secondary | ICD-10-CM | POA: Diagnosis not present

## 2019-09-21 DIAGNOSIS — I4891 Unspecified atrial fibrillation: Secondary | ICD-10-CM | POA: Diagnosis not present

## 2019-09-21 DIAGNOSIS — Z5181 Encounter for therapeutic drug level monitoring: Secondary | ICD-10-CM | POA: Diagnosis not present

## 2019-09-21 LAB — POCT INR: INR: 2 (ref 2.0–3.0)

## 2019-09-21 NOTE — Patient Instructions (Signed)
Continue warfarin 1 tablet daily Continue greens Recheck in 5 weeks 

## 2019-09-22 DIAGNOSIS — M79642 Pain in left hand: Secondary | ICD-10-CM | POA: Diagnosis not present

## 2019-09-24 DIAGNOSIS — M79642 Pain in left hand: Secondary | ICD-10-CM | POA: Diagnosis not present

## 2019-10-07 DIAGNOSIS — M65342 Trigger finger, left ring finger: Secondary | ICD-10-CM | POA: Insufficient documentation

## 2019-10-26 ENCOUNTER — Ambulatory Visit (INDEPENDENT_AMBULATORY_CARE_PROVIDER_SITE_OTHER): Payer: Medicare Other | Admitting: *Deleted

## 2019-10-26 ENCOUNTER — Ambulatory Visit: Payer: Medicare Other | Admitting: Cardiology

## 2019-10-26 ENCOUNTER — Other Ambulatory Visit: Payer: Self-pay

## 2019-10-26 DIAGNOSIS — Z952 Presence of prosthetic heart valve: Secondary | ICD-10-CM

## 2019-10-26 DIAGNOSIS — I4891 Unspecified atrial fibrillation: Secondary | ICD-10-CM | POA: Diagnosis not present

## 2019-10-26 DIAGNOSIS — Z5181 Encounter for therapeutic drug level monitoring: Secondary | ICD-10-CM | POA: Diagnosis not present

## 2019-10-26 LAB — POCT INR: INR: 2 (ref 2.0–3.0)

## 2019-10-26 NOTE — Patient Instructions (Signed)
Continue warfarin 1 tablet daily Continue greens Recheck in 6 weeks 

## 2019-11-02 ENCOUNTER — Encounter: Payer: Self-pay | Admitting: Cardiology

## 2019-11-02 NOTE — Progress Notes (Signed)
Cardiology Office Note  Date: 11/03/2019   ID: Douglas [name], DOB [birth year], MRN [id number]  PCP:  [name], MD  Cardiologist:  Rozann Lesches, MD Electrophysiologist:  None   Chief Complaint  Patient presents with  . Cardiac follow-up    History of Present Illness: Douglas [name] is an 86 y.o. male last seen by Ms. West Pugh in December 2019.  He presents for a follow-up visit.  From a cardiac perspective, he does not report any significant sense of palpitations, no dizziness or syncope.  He has not used any nitroglycerin for angina with typical ADLs.  Still lives at home with his wife and drives.  He remains on Coumadin with follow-up in the anticoagulation clinic.  Last INR was 2.0.  He does not report any spontaneous bleeding problems.  He follows with Dr. Lovena Le in the device clinic, Medtronic pacemaker in place.  He was last seen in September 2020.  I reviewed interval lab work from May 2020 as outlined below.  He still follows with Dr. Willey Blade.  Current cardiac regimen includes Toprol-XL, Lasix, Pravachol, as needed nitroglycerin, and Coumadin.  Cardiac testing from late 2019 included Myoview demonstrating mild to moderate region of ischemia in the inferior/inferolateral wall and echocardiography reporting LVEF 60% with stable prosthetic aortic valve function.   I personally reviewed his ECG today which shows a dual-chamber paced rhythm.  Past Medical History:  Diagnosis Date  . Acute upper GI bleed 05/18/2016  . Aneurysm of thoracic aorta (HCC)    Ascending. 5.7 x 5.5cm - Bentall procedure  . Aortic stenosis    Bioprosthetic AVR  . Arthritis   . Atrial fibrillation ([place])    Documented on pacer interrogation 6/13  . Complete heart block ([place])    Status post pacemaker placement  . Coronary atherosclerosis of native coronary artery    Multivessel - LIMA to LAD, SVG to OM, SVG to PDA  . DJD (degenerative joint disease)   . Esophageal dysmotility   . Essential  hypertension   . Fibromyalgia   . Gout   . H/O arteriovenous malformation (AVM)   . H/O hiatal hernia   . Hemorrhoid   . History of esophageal ulcer   . History of skin cancer   . Hx of adenomatous colonic polyps   . Iron deficiency anemia due to chronic blood loss   . Melena   . Pacemaker-Medtronic   . PMR (polymyalgia rheumatica) (HCC)   . SIRS (systemic inflammatory response syndrome) ([place]) 01/16/2015    Past Surgical History:  Procedure Laterality Date  . AORTIC VALVE REPLACEMENT  01/24/12  . APPENDECTOMY    . [place] or 1966 lumbar back surgery  . BENTALL PROCEDURE  01/24/2012   Procedure: BENTALL PROCEDURE;  Surgeon: Ivin Poot, MD;  Location: [facility];  Service: Open Heart Surgery;  Laterality: N/A;  . BILATERAL KNEE ARTHROSCOPY    . CATARACT EXTRACTION W/PHACO Left 12/03/2016   Procedure: CATARACT EXTRACTION PHACO AND INTRAOCULAR LENS PLACEMENT LEFT EYE;  Surgeon: Tonny Branch, MD;  Location: [facility];  Service: Ophthalmology;  Laterality: Left;  CDE:  10.87  . CATARACT EXTRACTION W/PHACO Right 12/13/2016   Procedure: CATARACT EXTRACTION PHACO AND INTRAOCULAR LENS PLACEMENT (IOC);  Surgeon: Tonny Branch, MD;  Location: [facility];  Service: Ophthalmology;  Laterality: Right;  right cde 11.78  . CORONARY ARTERY BYPASS GRAFT  01/24/2012   Procedure: CORONARY ARTERY BYPASS GRAFTING (CABG);  Surgeon: Ivin Poot, MD;  Location: [facility];  Service: Open Heart Surgery;  Laterality: N/A;  . ESOPHAGOGASTRODUODENOSCOPY  06/17/2012   Procedure: ESOPHAGOGASTRODUODENOSCOPY (EGD);  Surgeon: Rogene Houston, MD;  Location[facility];  Service: Endoscopy;  Laterality: N/A;  ED  . ESOPHAGOGASTRODUODENOSCOPY N/A 05/19/2016   Procedure: ESOPHAGOGASTRODUODENOSCOPY (EGD);  Surgeon: Danie Binder, MD;  [facility];  Service: Endoscopy;  Laterality: N/A;  . HEMORROIDECTOMY    . HERNIA REPAIR    . Left inguinal hernia repair    . PACEMAKER PLACEMENT     Medtronic 4/13 - Dr.  Rayann Heman  . PERMANENT PACEMAKER INSERTION N/A 02/01/2012   Procedure: PERMANENT PACEMAKER INSERTION;  Surgeon: Thompson Grayer, MD; [place] [facility] (serial number [id number])      Current Outpatient Medications  Medication Sig Dispense Refill  . acetaminophen (TYLENOL) 500 MG tablet Take 1,000 mg by mouth every 6 (six) hours as needed for mild pain.    Marland Kitchen allopurinol (ZYLOPRIM) 300 MG tablet Take 450 mg by mouth daily. Take 1 1/2 daily    . furosemide (LASIX) 40 MG tablet Take 20 mg by mouth daily.    . metoprolol succinate (TOPROL-XL) 25 MG 24 hr tablet TAKE (2) TABLETS BY MOUTH DAILY. 60 tablet 6  . nitroGLYCERIN (NITROSTAT) 0.4 MG SL tablet Place 1 tablet (0.4 mg total) under the tongue every 5 (five) minutes as needed for chest pain. 30 tablet 3  . pantoprazole (PROTONIX) 40 MG tablet TAKE ONE TABLET BY MOUTH ONCE DAILY IN THE MORNING. 30 tablet 6  . pravastatin (PRAVACHOL) 20 MG tablet Take 20 mg by mouth every evening.   11  . sennosides-docusate sodium (SENOKOT-S) 8.6-50 MG tablet Take 1 tablet by mouth 2 (two) times daily.    Marland Kitchen warfarin (COUMADIN) 5 MG tablet Take 1 tablet daily or as directed 90 tablet 3   No current facility-administered medications for this visit.   Allergies:  Imdur [isosorbide nitrate]   Social History: The patient  reports that he has never smoked. He has never used smokeless tobacco. He reports that he does not drink alcohol or use drugs.   ROS:  Please see the history of present illness. Otherwise, complete review of systems is positive for interval left hand injury, seen by orthopedics..  All other systems are reviewed and negative.   Physical Exam: VS:  BP (!) 147/80   Pulse 91   Temp 98.2 F (36.8 C)   Ht 5\' 6"  (1.676 m)   Wt 203 lb (92.1 kg)   SpO2 97%   BMI 32.77 kg/m , BMI Body mass index is 32.77 kg/m.  Wt Readings from Last 3 Encounters:  11/03/19 203 lb (92.1 kg)  07/07/19 206 lb (93.4 kg)  10/07/18 204 lb 6.4 oz (92.7 kg)      General: Elderly male, appears comfortable at rest. HEENT: Conjunctiva and lids normal, wearing a mask. Neck: Supple, no elevated JVP or carotid bruits, no thyromegaly. Lungs: Clear to auscultation, nonlabored breathing at rest. Cardiac: Regular rate and rhythm, no S3, 2/6 systolic murmur, no pericardial rub. Abdomen: Soft, nontender, bowel sounds present. Extremities: Mild ankle edema, distal pulses 2+. Skin: Warm and dry. Musculoskeletal: No kyphosis. Neuropsychiatric: Alert and oriented x3, affect grossly appropriate.  ECG:  An ECG dated 09/11/2018 was personally reviewed today and demonstrated:  Dual-chamber paced rhythm.  Recent Labwork:  May 2020: BUN 24, creatinine 1.21, potassium 5.1, AST 19, ALT 14, hemoglobin 14.1, platelets 189, cholesterol 151, triglycerides 120, HDL 42, LDL  66  Other Studies Reviewed Today:  Echocardiogram 09/22/2018: Study Conclusions  - Left ventricle: The cavity size was normal. Wall thickness was   increased in a pattern of mild LVH. Systolic function was normal.   The estimated ejection fraction was in the range of 55% to 60%.   There is mild hypokinesis of the apical anteroseptal myocardium,   possibly related to RV pacing. Indeterminate diastolic function. - Aortic valve: History of Bental procedure with 23 mm Edwards   pericardial valve along with 20mm Gelweave Valsalva ascending   aorta graft. Transvalvular velocity is in normal range. - Mitral valve: Mildly calcified annulus. There was trivial   regurgitation. - Left atrium: The atrium was mildly to moderately dilated. - Right ventricle: Pacer wire or catheter noted in right ventricle. - Right atrium: The atrium was at the upper limits of normal in   size. - Atrial septum: No defect or patent foramen ovale was identified. - Tricuspid valve: There was trivial regurgitation. - Pulmonary arteries: PA peak pressure: 23 mm Hg (S). - Pericardium, extracardiac: A prominent pericardial fat  pad was   present.  Lexiscan Myoview 09/05/2018:  Defect 1: There is a medium defect of mild severity present in the mid inferior, mid inferolateral and apical inferior location.  Findings consistent with a mild to moderate degree of ischemia.  This is a high risk study based primarily on severely reduced LVEF.  Nuclear stress EF: 16%.  ECG showed AV pacing throughout study.  Assessment and Plan:  1.  Paroxysmal atrial fibrillation identified by pacemaker interrogation over time.  He does not describe any palpitations and is on Coumadin with follow-up in anticoagulation clinic.  Continue Toprol-XL and observation.  2.  Ascending thoracic aortic aneurysm status post Bentall procedure in 2013.  Aortic prosthesis functioning normally by echocardiogram in December 2019.  3.  Complete heart block status post Medtronic pacemaker.  Keep follow-up with Dr. Lovena Le.  ECG shows dual-chamber pacing today.  4.  Multivessel CAD status post CABG in 2013.  No obvious angina symptoms.  He is not on aspirin given concurrent use of Coumadin.  Continue beta-blocker and statin therapy.  5.  Mixed hyperlipidemia on Pravachol.  Keep follow-up with Dr. Willey Blade.  Last LDL 85.  Medication Adjustments/Labs and Tests Ordered: Current medicines are reviewed at length with the patient today.  Concerns regarding medicines are outlined above.   Tests Ordered: Orders Placed This Encounter  Procedures  . EKG 12-Lead    Medication Changes: No orders of the defined types were placed in this encounter.   Disposition:  Follow up 6 months in the [place] office.  Signed, Satira Sark, MD, [facility] 11/03/2019 10:13 AM    [place] [facility] [address] [place], [place] [address] Phone: [phone]; Fax: [phone]

## 2019-11-03 ENCOUNTER — Encounter: Payer: Self-pay | Admitting: Cardiology

## 2019-11-03 ENCOUNTER — Ambulatory Visit (INDEPENDENT_AMBULATORY_CARE_PROVIDER_SITE_OTHER): Payer: Medicare Other | Admitting: Cardiology

## 2019-11-03 VITALS — BP 147/80 | HR 91 | Temp 98.2°F | Ht 66.0 in | Wt 203.0 lb

## 2019-11-03 DIAGNOSIS — I480 Paroxysmal atrial fibrillation: Secondary | ICD-10-CM

## 2019-11-03 DIAGNOSIS — Z953 Presence of xenogenic heart valve: Secondary | ICD-10-CM | POA: Diagnosis not present

## 2019-11-03 DIAGNOSIS — Z950 Presence of cardiac pacemaker: Secondary | ICD-10-CM

## 2019-11-03 DIAGNOSIS — I25119 Atherosclerotic heart disease of native coronary artery with unspecified angina pectoris: Secondary | ICD-10-CM | POA: Diagnosis not present

## 2019-11-03 NOTE — Patient Instructions (Signed)
Medication Instructions:  Your physician recommends that you continue on your current medications as directed. Please refer to the Current Medication list given to you today.  *If you need a refill on your cardiac medications before your next appointment, please call your pharmacy*  Lab Work: NONE If you have labs (blood work) drawn today and your tests are completely normal, you will receive your results only by: Marland Kitchen MyChart Message (if you have MyChart) OR . A paper copy in the mail If you have any lab test that is abnormal or we need to change your treatment, we will call you to review the results.  Testing/Procedures: NONE  Follow-Up: At [facility], you and your health needs are our priority.  As part of our continuing mission to provide you with exceptional heart care, we have created designated Provider Care Teams.  These Care Teams include your primary Cardiologist (physician) and Advanced Practice Providers (APPs -  Physician Assistants and Nurse Practitioners) who all work together to provide you with the care you need, when you need it.  Your next appointment:   6 month(s)  The format for your next appointment:   In Person  Provider:   Rozann Lesches, MD  Other Instructions  Call [facility] [phone]       Thank you for choosing [facility] !

## 2019-11-06 DIAGNOSIS — N183 Chronic kidney disease, stage 3 unspecified: Secondary | ICD-10-CM | POA: Diagnosis not present

## 2019-11-06 DIAGNOSIS — I5032 Chronic diastolic (congestive) heart failure: Secondary | ICD-10-CM | POA: Diagnosis not present

## 2019-11-06 DIAGNOSIS — Z79899 Other long term (current) drug therapy: Secondary | ICD-10-CM | POA: Diagnosis not present

## 2019-11-13 DIAGNOSIS — K449 Diaphragmatic hernia without obstruction or gangrene: Secondary | ICD-10-CM | POA: Diagnosis not present

## 2019-11-13 DIAGNOSIS — I480 Paroxysmal atrial fibrillation: Secondary | ICD-10-CM | POA: Diagnosis not present

## 2019-11-13 DIAGNOSIS — N1831 Chronic kidney disease, stage 3a: Secondary | ICD-10-CM | POA: Diagnosis not present

## 2019-11-24 ENCOUNTER — Ambulatory Visit (INDEPENDENT_AMBULATORY_CARE_PROVIDER_SITE_OTHER): Payer: Medicare Other | Admitting: *Deleted

## 2019-11-24 DIAGNOSIS — I442 Atrioventricular block, complete: Secondary | ICD-10-CM | POA: Diagnosis not present

## 2019-11-24 LAB — CUP PACEART REMOTE DEVICE CHECK
Battery Impedance: 1201 Ohm
Battery Remaining Longevity: 54 mo
Battery Voltage: 2.76 V
Brady Statistic AP VP Percent: 54 %
Brady Statistic AP VS Percent: 0 %
Brady Statistic AS VP Percent: 46 %
Brady Statistic AS VS Percent: 0 %
Date Time Interrogation Session: 20210201141511
Implantable Lead Implant Date: 20130412
Implantable Lead Implant Date: 20130412
Implantable Lead Location: 753859
Implantable Lead Location: 753860
Implantable Lead Model: 5076
Implantable Lead Model: 5076
Implantable Pulse Generator Implant Date: 20130412
Lead Channel Impedance Value: 465 Ohm
Lead Channel Impedance Value: 555 Ohm
Lead Channel Pacing Threshold Amplitude: 0.5 V
Lead Channel Pacing Threshold Amplitude: 0.625 V
Lead Channel Pacing Threshold Pulse Width: 0.4 ms
Lead Channel Pacing Threshold Pulse Width: 0.4 ms
Lead Channel Setting Pacing Amplitude: 1.5 V
Lead Channel Setting Pacing Amplitude: 2 V
Lead Channel Setting Pacing Pulse Width: 0.4 ms
Lead Channel Setting Sensing Sensitivity: 4 mV

## 2019-11-25 NOTE — Progress Notes (Signed)
PPM Remote  

## 2019-12-15 ENCOUNTER — Ambulatory Visit (INDEPENDENT_AMBULATORY_CARE_PROVIDER_SITE_OTHER): Payer: Medicare Other | Admitting: *Deleted

## 2019-12-15 ENCOUNTER — Other Ambulatory Visit: Payer: Self-pay

## 2019-12-15 DIAGNOSIS — Z952 Presence of prosthetic heart valve: Secondary | ICD-10-CM | POA: Diagnosis not present

## 2019-12-15 DIAGNOSIS — Z5181 Encounter for therapeutic drug level monitoring: Secondary | ICD-10-CM | POA: Diagnosis not present

## 2019-12-15 DIAGNOSIS — I4891 Unspecified atrial fibrillation: Secondary | ICD-10-CM | POA: Diagnosis not present

## 2019-12-15 LAB — POCT INR: INR: 2.3 (ref 2.0–3.0)

## 2019-12-15 NOTE — Patient Instructions (Signed)
Continue warfarin 1 tablet daily Continue greens Recheck in 6 weeks 

## 2019-12-21 DIAGNOSIS — M25511 Pain in right shoulder: Secondary | ICD-10-CM | POA: Insufficient documentation

## 2019-12-23 ENCOUNTER — Other Ambulatory Visit: Payer: Self-pay | Admitting: Internal Medicine

## 2020-01-25 ENCOUNTER — Other Ambulatory Visit: Payer: Self-pay

## 2020-01-25 ENCOUNTER — Ambulatory Visit (INDEPENDENT_AMBULATORY_CARE_PROVIDER_SITE_OTHER): Payer: Medicare Other | Admitting: *Deleted

## 2020-01-25 DIAGNOSIS — Z952 Presence of prosthetic heart valve: Secondary | ICD-10-CM

## 2020-01-25 DIAGNOSIS — I4891 Unspecified atrial fibrillation: Secondary | ICD-10-CM | POA: Diagnosis not present

## 2020-01-25 DIAGNOSIS — Z5181 Encounter for therapeutic drug level monitoring: Secondary | ICD-10-CM | POA: Diagnosis not present

## 2020-01-25 LAB — POCT INR: INR: 2.2 (ref 2.0–3.0)

## 2020-01-25 NOTE — Patient Instructions (Signed)
Continue warfarin 1 tablet daily Continue greens Recheck in 6 weeks 

## 2020-02-22 DIAGNOSIS — R079 Chest pain, unspecified: Secondary | ICD-10-CM | POA: Diagnosis not present

## 2020-02-22 DIAGNOSIS — I2510 Atherosclerotic heart disease of native coronary artery without angina pectoris: Secondary | ICD-10-CM | POA: Diagnosis not present

## 2020-02-22 DIAGNOSIS — Z6835 Body mass index (BMI) 35.0-35.9, adult: Secondary | ICD-10-CM | POA: Diagnosis not present

## 2020-02-23 ENCOUNTER — Ambulatory Visit (INDEPENDENT_AMBULATORY_CARE_PROVIDER_SITE_OTHER): Payer: Medicare Other | Admitting: *Deleted

## 2020-02-23 DIAGNOSIS — I442 Atrioventricular block, complete: Secondary | ICD-10-CM

## 2020-02-23 LAB — CUP PACEART REMOTE DEVICE CHECK
Battery Impedance: 1281 Ohm
Battery Remaining Longevity: 52 mo
Battery Voltage: 2.76 V
Brady Statistic AP VP Percent: 59 %
Brady Statistic AP VS Percent: 0 %
Brady Statistic AS VP Percent: 41 %
Brady Statistic AS VS Percent: 0 %
Date Time Interrogation Session: 20210504082216
Implantable Lead Implant Date: 20130412
Implantable Lead Implant Date: 20130412
Implantable Lead Location: 753859
Implantable Lead Location: 753860
Implantable Lead Model: 5076
Implantable Lead Model: 5076
Implantable Pulse Generator Implant Date: 20130412
Lead Channel Impedance Value: 472 Ohm
Lead Channel Impedance Value: 604 Ohm
Lead Channel Pacing Threshold Amplitude: 0.5 V
Lead Channel Pacing Threshold Amplitude: 0.75 V
Lead Channel Pacing Threshold Pulse Width: 0.4 ms
Lead Channel Pacing Threshold Pulse Width: 0.4 ms
Lead Channel Setting Pacing Amplitude: 1.5 V
Lead Channel Setting Pacing Amplitude: 2 V
Lead Channel Setting Pacing Pulse Width: 0.4 ms
Lead Channel Setting Sensing Sensitivity: 4 mV

## 2020-02-24 NOTE — Progress Notes (Signed)
Remote pacemaker transmission.   

## 2020-02-26 ENCOUNTER — Ambulatory Visit: Payer: Medicare Other | Admitting: Student

## 2020-02-29 NOTE — Progress Notes (Signed)
Cardiology Office Note    Date:  03/01/2020   ID:  Douglas [name], DOB 02-17-1928, MRN [id number]  PCP:  [name], MD  Cardiologist: Rozann Lesches, MD   EP: Dr. Lovena Le  Chief Complaint  Patient presents with  . Follow-up    recent angina    History of Present Illness:    Douglas [name] is a 86 y.o. male with past medical history of paroxysmal atrial fibrillation (on Coumadin for anticoagulation), CAD (s/p CABG in 2013 with NST in 08/2018 showing mild to moderate ischemia), CHB (s/p Medtronic PPM placement), thoracic aortic aneurysm (s/p Bentall procedure in 2013 with AVR), HTN and HLD who presents to the office today for evaluation of chest pain.    He was last examined by Dr. Domenic Polite in 10/2019 and denied any chest pain or palpitations at that time. Was continued on his current cardiac regimen including Lasix 20mg  daily, Toprol-XL 50mg  daily, Pravastatin 20mg  daily and Coumadin for anticoagulation.   In the interim, Dr. Willey Blade reached out to Dr. Domenic Polite as the patient was experiencing progressive angina. He was started on Amlodipine and follow-up was arranged to discuss medical management vs. potential catheterization.   In talking with the patient today, he reports overall doing well since being started on Amlodipine and denies any recurrent chest pain. Prior to this, he was developing chest discomfort while working in his garden and symptoms improved with rest. Denies any associated nausea, vomiting, diaphoresis or dyspnea. He remains active at baseline for his age and is in the yard or garden a large portion of the day. He denies any recent orthopnea, PND or lower extremity edema.  He reports good compliance with Coumadin and denies any recent melena, hematochezia or hematuria.  Past Medical History:  Diagnosis Date  . Acute upper GI bleed 05/18/2016  . Aneurysm of thoracic aorta (HCC)    Ascending. 5.7 x 5.5cm - Bentall procedure  . Aortic stenosis    Bioprosthetic AVR  .  Arthritis   . Atrial fibrillation ([place])    Documented on pacer interrogation 6/13  . Complete heart block ([place])    Status post pacemaker placement  . Coronary atherosclerosis of native coronary artery    Multivessel - LIMA to LAD, SVG to OM, SVG to PDA  . DJD (degenerative joint disease)   . Esophageal dysmotility   . Essential hypertension   . Fibromyalgia   . Gout   . H/O arteriovenous malformation (AVM)   . H/O hiatal hernia   . Hemorrhoid   . History of esophageal ulcer   . History of skin cancer   . Hx of adenomatous colonic polyps   . Iron deficiency anemia due to chronic blood loss   . Melena   . Pacemaker-Medtronic   . PMR (polymyalgia rheumatica) (HCC)   . SIRS (systemic inflammatory response syndrome) ([place]) 01/16/2015    Past Surgical History:  Procedure Laterality Date  . AORTIC VALVE REPLACEMENT  01/24/12  . APPENDECTOMY    . [place] or 1966 lumbar back surgery  . BENTALL PROCEDURE  01/24/2012   Procedure: BENTALL PROCEDURE;  Surgeon: Ivin Poot, MD;  Location: [facility];  Service: Open Heart Surgery;  Laterality: N/A;  . BILATERAL KNEE ARTHROSCOPY    . CATARACT EXTRACTION W/PHACO Left 12/03/2016   Procedure: CATARACT EXTRACTION PHACO AND INTRAOCULAR LENS PLACEMENT LEFT EYE;  Surgeon: Tonny Branch, MD;  Location: [facility];  Service: Ophthalmology;  Laterality: Left;  CDE:  10.87  . CATARACT EXTRACTION W/PHACO Right 12/13/2016   Procedure: CATARACT EXTRACTION PHACO AND INTRAOCULAR LENS PLACEMENT (IOC);  Surgeon: Tonny Branch, MD;  Location: [facility];  Service: Ophthalmology;  Laterality: Right;  right cde 11.78  . CORONARY ARTERY BYPASS GRAFT  01/24/2012   Procedure: CORONARY ARTERY BYPASS GRAFTING (CABG);  Surgeon: Ivin Poot, MD;  Location: [facility];  Service: Open Heart Surgery;  Laterality: N/A;  . ESOPHAGOGASTRODUODENOSCOPY  06/17/2012   Procedure: ESOPHAGOGASTRODUODENOSCOPY (EGD);  Surgeon: Rogene Houston, MD;  [facility];  Service: Endoscopy;   Laterality: N/A;  ED  . ESOPHAGOGASTRODUODENOSCOPY N/A 05/19/2016   Procedure: ESOPHAGOGASTRODUODENOSCOPY (EGD);  Surgeon: Danie Binder, MD;  [facility];  Service: Endoscopy;  Laterality: N/A;  . HEMORROIDECTOMY    . HERNIA REPAIR    . Left inguinal hernia repair    . PACEMAKER PLACEMENT     Medtronic 4/13 - Dr. Rayann Heman  . PERMANENT PACEMAKER INSERTION N/A 02/01/2012   Procedure: PERMANENT PACEMAKER INSERTION;  Surgeon: Thompson Grayer, MD; [place] 1 (serial number [id number])      Current Medications: Outpatient Medications Prior to Visit  Medication Sig Dispense Refill  . acetaminophen (TYLENOL) 500 MG tablet Take 1,000 mg by mouth every 6 (six) hours as needed for mild pain.    Marland Kitchen allopurinol (ZYLOPRIM) 300 MG tablet Take 300 mg by mouth daily. Take 1  daily    . amLODipine (NORVASC) 2.5 MG tablet Take 2.5 mg by mouth daily.    . furosemide (LASIX) 40 MG tablet Take 20 mg by mouth daily.    . metoprolol succinate (TOPROL-XL) 25 MG 24 hr tablet TAKE (2) TABLETS BY MOUTH DAILY. 60 tablet 11  . nitroGLYCERIN (NITROSTAT) 0.4 MG SL tablet Place 1 tablet (0.4 mg total) under the tongue every 5 (five) minutes as needed for chest pain. 30 tablet 3  . pantoprazole (PROTONIX) 40 MG tablet TAKE ONE TABLET BY MOUTH ONCE DAILY IN THE MORNING. 30 tablet 6  . pravastatin (PRAVACHOL) 20 MG tablet Take 20 mg by mouth every evening.   11  . sennosides-docusate sodium (SENOKOT-S) 8.6-50 MG tablet Take 1 tablet by mouth 2 (two) times daily.    Marland Kitchen warfarin (COUMADIN) 5 MG tablet Take 1 tablet daily or as directed 90 tablet 3   No facility-administered medications prior to visit.     Allergies:   Imdur [isosorbide nitrate]   Social History   Socioeconomic History  . Marital status: Married    Spouse name: Not on file  . Number of children: 4  . Years of education: Not on file  . Highest education level: Not on file  Occupational History  . Occupation: Retired     Comment: Scientist, forensic  Tobacco Use  . Smoking status: Never Smoker  . Smokeless tobacco: Never Used  Substance and Sexual Activity  . Alcohol use: No    Alcohol/week: 0.0 standard drinks  . Drug use: No  . Sexual activity: Never    Birth control/protection: None  Other Topics Concern  . Not on file  Social History Narrative   Lives with wife, has been married since 80.   Social Determinants of Health   Financial Resource Strain:   . Difficulty of Paying Living Expenses:   Food Insecurity:   . Worried About Charity fundraiser in the Last Year:   . Arboriculturist in the Last Year:   Transportation Needs:   . Lack  of Transportation (Medical):   Marland Kitchen Lack of Transportation (Non-Medical):   Physical Activity:   . Days of Exercise per Week:   . Minutes of Exercise per Session:   Stress:   . Feeling of Stress :   Social Connections:   . Frequency of Communication with Friends and Family:   . Frequency of Social Gatherings with Friends and Family:   . Attends Religious Services:   . Active Member of Clubs or Organizations:   . Attends Archivist Meetings:   Marland Kitchen Marital Status:      Family History:  The patient's family history includes Heart disease in his father; Kidney disease in his mother.   Review of Systems:   Please see the history of present illness.     General:  No chills, fever, night sweats or weight changes.  Cardiovascular:  No dyspnea on exertion, edema, orthopnea, palpitations, paroxysmal nocturnal dyspnea. Positive for chest pain (now resolved).  Dermatological: No rash, lesions/masses Respiratory: No cough, dyspnea Urologic: No hematuria, dysuria Abdominal:   No nausea, vomiting, diarrhea, bright red blood per rectum, melena, or hematemesis Neurologic:  No visual changes, wkns, changes in mental status. All other systems reviewed and are otherwise negative except as noted above.   Physical Exam:    VS:  BP 132/84   Pulse 70   Ht 5\' 5"   (1.651 m)   Wt 210 lb (95.3 kg)   SpO2 97%   BMI 34.95 kg/m    General: Well developed, elderly male appearing in no acute distress. Head: Normocephalic, atraumatic, sclera non-icteric.  Neck: No carotid bruits. JVD not elevated.  Lungs: Respirations regular and unlabored, without wheezes or rales.  Heart: Regular rate and rhythm. No S3 or S4.  No rubs or gallops appreciated. 2/6 SEM along RUSB.  Abdomen: Soft, non-tender, non-distended. No obvious abdominal masses. Msk:  Strength and tone appear normal for age. No obvious joint deformities or effusions. Extremities: No clubbing or cyanosis. No lower extremity edema.  Distal pedal pulses are 2+ bilaterally. Neuro: Alert and oriented X 3. Moves all extremities spontaneously. No focal deficits noted. Psych:  Responds to questions appropriately with a normal affect. Skin: No rashes or lesions noted  Wt Readings from Last 3 Encounters:  03/01/20 210 lb (95.3 kg)  11/03/19 203 lb (92.1 kg)  07/07/19 206 lb (93.4 kg)     Studies/Labs Reviewed:   EKG:  EKG is not ordered today. EKG from 11/03/2019 is reviewed and shows AV-paced rhythm, HR 78.  Recent Labs: No results found for requested labs within last 8760 hours.   Lipid Panel No results found for: CHOL, TRIG, HDL, CHOLHDL, VLDL, LDLCALC, LDLDIRECT  Additional studies/ records that were reviewed today include:   NST: 08/2018  Defect 1: There is a medium defect of mild severity present in the mid inferior, mid inferolateral and apical inferior location.  Findings consistent with a mild to moderate degree of ischemia.  This is a high risk study based primarily on severely reduced LVEF.  Nuclear stress EF: 16%.  ECG showed AV pacing throughout study.  Echocardiogram: 09/2018 Study Conclusions   - Left ventricle: The cavity size was normal. Wall thickness was  increased in a pattern of mild LVH. Systolic function was normal.  The estimated ejection fraction was in the  range of 55% to 60%.  There is mild hypokinesis of the apical anteroseptal myocardium,  possibly related to RV pacing. Indeterminate diastolic function.  - Aortic valve: History of Bental procedure with  23 mm Edwards  pericardial valve along with 93mm Gelweave Valsalva ascending  aorta graft. Transvalvular velocity is in normal range.  - Mitral valve: Mildly calcified annulus. There was trivial  regurgitation.  - Left atrium: The atrium was mildly to moderately dilated.  - Right ventricle: Pacer wire or catheter noted in right ventricle.  - Right atrium: The atrium was at the upper limits of normal in  size.  - Atrial septum: No defect or patent foramen ovale was identified.  - Tricuspid valve: There was trivial regurgitation.  - Pulmonary arteries: PA peak pressure: 23 mm Hg (S).  - Pericardium, extracardiac: A prominent pericardial fat pad was  present.   Assessment:    1. Coronary artery disease of native artery of native heart with stable angina pectoris ([place])   2. PAF (paroxysmal atrial fibrillation) ([place])   3. CHB (complete heart block) (HCC)   4. Status post aortic valve replacement with bioprosthetic valve   5. Essential hypertension      Plan:   In order of problems listed above:  1. CAD - he is s/p CABG in 2013 with NST in 08/2018 showing mild to moderate ischemia. His recent episodes of chest discomfort are consistent with angina as they were occurring with exertion and improving with rest. Since starting Amlodipine, his episodes have resolved. Reviewed options with the patient today and he prefers medical management given symptom improvement which is certainly reasonable in the setting of his advanced age. We reviewed to make [place] aware if symptoms return, as Amlodipine could be further titrated. He says he would be in agreement for catheterization in the future if symptoms did not improve with medical management. - Continue Amlodipine 2.5 mg daily, Toprol-XL  50 mg daily and Pravastatin 20 mg daily.  He is no longer on ASA given the need for anticoagulation.  2. Paroxysmal atrial fibrillation - Most recent device interrogation showed an atrial fibrillation burden of 1.9%. He denies any recent palpitations. Continue Toprol-XL 50 mg daily for rate control.   - He denies any evidence of active bleeding. Will request most recent labs from PCP. Remains on Coumadin for anticoagulation.    3. CHB - he is s/p Medtronic PPM placement. This is followed by Dr. Lovena Le and most recent interrogation earlier this month showed normal device function.  4. Thoracic Aortic Aneurysm - s/p Bentall procedure in 2013 with AVR. Echocardiogram in 09/2018 showed transvalvular velocity was within a normal range.  5. HTN - BP is well controlled at 132/84 during today's visit.  Continue current regimen with Amlodipine 2.5 mg daily and Toprol-XL 50 mg daily.    Medication Adjustments/Labs and Tests Ordered: Current medicines are reviewed at length with the patient today.  Concerns regarding medicines are outlined above.  Medication changes, Labs and Tests ordered today are listed in the Patient Instructions below. Patient Instructions  Medication Instructions:  Your physician recommends that you continue on your current medications as directed. Please refer to the Current Medication list given to you today.  *If you need a refill on your cardiac medications before your next appointment, please call your pharmacy*   Lab Work: NONE   If you have labs (blood work) drawn today and your tests are completely normal, you will receive your results only by: Marland Kitchen MyChart Message (if you have MyChart) OR . A paper copy in the mail If you have any lab test that is abnormal or we need to change your treatment, we will call you to review  the results.   Testing/Procedures: NONE   Follow-Up: At [facility], you and your health needs are our priority.  As part of our continuing  mission to provide you with exceptional heart care, we have created designated Provider Care Teams.  These Care Teams include your primary Cardiologist (physician) and Advanced Practice Providers (APPs -  Physician Assistants and Nurse Practitioners) who all work together to provide you with the care you need, when you need it.  We recommend signing up for the patient portal called "MyChart".  Sign up information is provided on this After Visit Summary.  MyChart is used to connect with patients for Virtual Visits (Telemedicine).  Patients are able to view lab/test results, encounter notes, upcoming appointments, etc.  Non-urgent messages can be sent to your provider as well.   To learn more about what you can do with MyChart, go to [email or web address].    Your next appointment:   3-4 month(s)  The format for your next appointment:   In Person  Provider:   Rozann Lesches, MD   Other Instructions Thank you for choosing [facility]!    Signed, Erma Heritage, PA-C  03/01/2020 5:10 PM    [place] [address] [place], [place] [address] Phone: [phone] Fax: [phone]

## 2020-03-01 ENCOUNTER — Encounter: Payer: Self-pay | Admitting: Student

## 2020-03-01 ENCOUNTER — Ambulatory Visit (INDEPENDENT_AMBULATORY_CARE_PROVIDER_SITE_OTHER): Payer: Medicare Other | Admitting: Student

## 2020-03-01 ENCOUNTER — Other Ambulatory Visit: Payer: Self-pay

## 2020-03-01 VITALS — BP 132/84 | HR 70 | Ht 65.0 in | Wt 210.0 lb

## 2020-03-01 DIAGNOSIS — I480 Paroxysmal atrial fibrillation: Secondary | ICD-10-CM

## 2020-03-01 DIAGNOSIS — Z953 Presence of xenogenic heart valve: Secondary | ICD-10-CM | POA: Diagnosis not present

## 2020-03-01 DIAGNOSIS — I442 Atrioventricular block, complete: Secondary | ICD-10-CM | POA: Diagnosis not present

## 2020-03-01 DIAGNOSIS — I10 Essential (primary) hypertension: Secondary | ICD-10-CM

## 2020-03-01 DIAGNOSIS — I25118 Atherosclerotic heart disease of native coronary artery with other forms of angina pectoris: Secondary | ICD-10-CM | POA: Diagnosis not present

## 2020-03-01 DIAGNOSIS — I25119 Atherosclerotic heart disease of native coronary artery with unspecified angina pectoris: Secondary | ICD-10-CM

## 2020-03-01 NOTE — Patient Instructions (Signed)
Medication Instructions:  Your physician recommends that you continue on your current medications as directed. Please refer to the Current Medication list given to you today.  *If you need a refill on your cardiac medications before your next appointment, please call your pharmacy*   Lab Work: NONE   If you have labs (blood work) drawn today and your tests are completely normal, you will receive your results only by: Marland Kitchen MyChart Message (if you have MyChart) OR . A paper copy in the mail If you have any lab test that is abnormal or we need to change your treatment, we will call you to review the results.   Testing/Procedures: NONE   Follow-Up: At [facility], you and your health needs are our priority.  As part of our continuing mission to provide you with exceptional heart care, we have created designated Provider Care Teams.  These Care Teams include your primary Cardiologist (physician) and Advanced Practice Providers (APPs -  Physician Assistants and Nurse Practitioners) who all work together to provide you with the care you need, when you need it.  We recommend signing up for the patient portal called "MyChart".  Sign up information is provided on this After Visit Summary.  MyChart is used to connect with patients for Virtual Visits (Telemedicine).  Patients are able to view lab/test results, encounter notes, upcoming appointments, etc.  Non-urgent messages can be sent to your provider as well.   To learn more about what you can do with MyChart, go to [email or web address].    Your next appointment:   3-4 month(s)  The format for your next appointment:   In Person  Provider:   Rozann Lesches, MD   Other Instructions Thank you for choosing [facility]!  Thank you for choosing [facility]!

## 2020-03-07 ENCOUNTER — Other Ambulatory Visit: Payer: Self-pay

## 2020-03-07 ENCOUNTER — Ambulatory Visit (INDEPENDENT_AMBULATORY_CARE_PROVIDER_SITE_OTHER): Payer: Medicare Other | Admitting: *Deleted

## 2020-03-07 DIAGNOSIS — Z5181 Encounter for therapeutic drug level monitoring: Secondary | ICD-10-CM

## 2020-03-07 DIAGNOSIS — Z952 Presence of prosthetic heart valve: Secondary | ICD-10-CM | POA: Diagnosis not present

## 2020-03-07 DIAGNOSIS — I4891 Unspecified atrial fibrillation: Secondary | ICD-10-CM | POA: Diagnosis not present

## 2020-03-07 LAB — POCT INR: INR: 2.6 (ref 2.0–3.0)

## 2020-03-07 NOTE — Patient Instructions (Signed)
Continue warfarin 1 tablet daily Continue greens Recheck in 6 weeks 

## 2020-03-08 DIAGNOSIS — Z79899 Other long term (current) drug therapy: Secondary | ICD-10-CM | POA: Diagnosis not present

## 2020-03-08 DIAGNOSIS — M109 Gout, unspecified: Secondary | ICD-10-CM | POA: Diagnosis not present

## 2020-03-08 DIAGNOSIS — I480 Paroxysmal atrial fibrillation: Secondary | ICD-10-CM | POA: Diagnosis not present

## 2020-03-08 DIAGNOSIS — N183 Chronic kidney disease, stage 3 unspecified: Secondary | ICD-10-CM | POA: Diagnosis not present

## 2020-03-08 DIAGNOSIS — M353 Polymyalgia rheumatica: Secondary | ICD-10-CM | POA: Diagnosis not present

## 2020-03-15 DIAGNOSIS — Z23 Encounter for immunization: Secondary | ICD-10-CM | POA: Diagnosis not present

## 2020-03-15 DIAGNOSIS — M1000 Idiopathic gout, unspecified site: Secondary | ICD-10-CM | POA: Diagnosis not present

## 2020-03-15 DIAGNOSIS — S81812A Laceration without foreign body, left lower leg, initial encounter: Secondary | ICD-10-CM | POA: Diagnosis not present

## 2020-03-15 DIAGNOSIS — I2510 Atherosclerotic heart disease of native coronary artery without angina pectoris: Secondary | ICD-10-CM | POA: Diagnosis not present

## 2020-03-15 DIAGNOSIS — Z6834 Body mass index (BMI) 34.0-34.9, adult: Secondary | ICD-10-CM | POA: Diagnosis not present

## 2020-03-15 DIAGNOSIS — I10 Essential (primary) hypertension: Secondary | ICD-10-CM | POA: Diagnosis not present

## 2020-03-15 DIAGNOSIS — I480 Paroxysmal atrial fibrillation: Secondary | ICD-10-CM | POA: Diagnosis not present

## 2020-03-16 DIAGNOSIS — M170 Bilateral primary osteoarthritis of knee: Secondary | ICD-10-CM | POA: Diagnosis not present

## 2020-03-16 DIAGNOSIS — M25561 Pain in right knee: Secondary | ICD-10-CM | POA: Diagnosis not present

## 2020-03-16 DIAGNOSIS — M25562 Pain in left knee: Secondary | ICD-10-CM | POA: Diagnosis not present

## 2020-04-18 ENCOUNTER — Ambulatory Visit (INDEPENDENT_AMBULATORY_CARE_PROVIDER_SITE_OTHER): Payer: Medicare Other | Admitting: *Deleted

## 2020-04-18 DIAGNOSIS — Z952 Presence of prosthetic heart valve: Secondary | ICD-10-CM | POA: Diagnosis not present

## 2020-04-18 DIAGNOSIS — Z5181 Encounter for therapeutic drug level monitoring: Secondary | ICD-10-CM

## 2020-04-18 DIAGNOSIS — I4891 Unspecified atrial fibrillation: Secondary | ICD-10-CM

## 2020-04-18 LAB — POCT INR: INR: 1.9 — AB (ref 2.0–3.0)

## 2020-04-18 NOTE — Patient Instructions (Signed)
Take warfarin 1 1/2 tablets tonight then resume 1 tablet daily  Continue greens Recheck in 6 weeks

## 2020-05-24 ENCOUNTER — Ambulatory Visit (INDEPENDENT_AMBULATORY_CARE_PROVIDER_SITE_OTHER): Payer: Medicare Other | Admitting: *Deleted

## 2020-05-24 DIAGNOSIS — I442 Atrioventricular block, complete: Secondary | ICD-10-CM | POA: Diagnosis not present

## 2020-05-25 LAB — CUP PACEART REMOTE DEVICE CHECK
Battery Impedance: 1419 Ohm
Battery Remaining Longevity: 47 mo
Battery Voltage: 2.75 V
Brady Statistic AP VP Percent: 61 %
Brady Statistic AP VS Percent: 0 %
Brady Statistic AS VP Percent: 39 %
Brady Statistic AS VS Percent: 0 %
Date Time Interrogation Session: 20210803200903
Implantable Lead Implant Date: 20130412
Implantable Lead Implant Date: 20130412
Implantable Lead Location: 753859
Implantable Lead Location: 753860
Implantable Lead Model: 5076
Implantable Lead Model: 5076
Implantable Pulse Generator Implant Date: 20130412
Lead Channel Impedance Value: 465 Ohm
Lead Channel Impedance Value: 566 Ohm
Lead Channel Pacing Threshold Amplitude: 0.5 V
Lead Channel Pacing Threshold Amplitude: 0.625 V
Lead Channel Pacing Threshold Pulse Width: 0.4 ms
Lead Channel Pacing Threshold Pulse Width: 0.4 ms
Lead Channel Setting Pacing Amplitude: 1.5 V
Lead Channel Setting Pacing Amplitude: 2 V
Lead Channel Setting Pacing Pulse Width: 0.4 ms
Lead Channel Setting Sensing Sensitivity: 4 mV

## 2020-05-26 NOTE — Progress Notes (Signed)
Remote pacemaker transmission.   

## 2020-05-30 ENCOUNTER — Ambulatory Visit (INDEPENDENT_AMBULATORY_CARE_PROVIDER_SITE_OTHER): Payer: Medicare Other | Admitting: *Deleted

## 2020-05-30 DIAGNOSIS — Z5181 Encounter for therapeutic drug level monitoring: Secondary | ICD-10-CM | POA: Diagnosis not present

## 2020-05-30 DIAGNOSIS — I480 Paroxysmal atrial fibrillation: Secondary | ICD-10-CM | POA: Diagnosis not present

## 2020-05-30 DIAGNOSIS — Z952 Presence of prosthetic heart valve: Secondary | ICD-10-CM | POA: Diagnosis not present

## 2020-05-30 DIAGNOSIS — I4891 Unspecified atrial fibrillation: Secondary | ICD-10-CM

## 2020-05-30 LAB — POCT INR: INR: 2.2 (ref 2.0–3.0)

## 2020-05-30 NOTE — Patient Instructions (Signed)
Continue warfarin 1 tablet daily Continue greens Recheck in 6 weeks 

## 2020-06-14 DIAGNOSIS — H11121 Conjunctival concretions, right eye: Secondary | ICD-10-CM | POA: Diagnosis not present

## 2020-06-14 DIAGNOSIS — D492 Neoplasm of unspecified behavior of bone, soft tissue, and skin: Secondary | ICD-10-CM | POA: Diagnosis not present

## 2020-06-14 DIAGNOSIS — Z961 Presence of intraocular lens: Secondary | ICD-10-CM | POA: Diagnosis not present

## 2020-06-14 DIAGNOSIS — H0102A Squamous blepharitis right eye, upper and lower eyelids: Secondary | ICD-10-CM | POA: Diagnosis not present

## 2020-06-14 DIAGNOSIS — H0102B Squamous blepharitis left eye, upper and lower eyelids: Secondary | ICD-10-CM | POA: Diagnosis not present

## 2020-06-16 ENCOUNTER — Telehealth: Payer: Self-pay | Admitting: *Deleted

## 2020-06-16 DIAGNOSIS — I2510 Atherosclerotic heart disease of native coronary artery without angina pectoris: Secondary | ICD-10-CM | POA: Diagnosis not present

## 2020-06-16 DIAGNOSIS — I480 Paroxysmal atrial fibrillation: Secondary | ICD-10-CM | POA: Diagnosis not present

## 2020-06-16 NOTE — Telephone Encounter (Signed)
Spoke with patient.  Was started on doxycyline 100mg  BID on Tuesday 8/24.  Advised patient that antibiotic may increase his INR.  Scheduled recheck INR appointment for Monday 8/30

## 2020-06-16 NOTE — Telephone Encounter (Signed)
DOXYOCLINE  TOBRAMYCIN EYE DROPS   Patient was seen by eye doctor for an eye infection. Was told to contact Douglas [name] in regards to patient's coumdin .

## 2020-06-20 ENCOUNTER — Ambulatory Visit (INDEPENDENT_AMBULATORY_CARE_PROVIDER_SITE_OTHER): Payer: Medicare Other | Admitting: *Deleted

## 2020-06-20 DIAGNOSIS — Z952 Presence of prosthetic heart valve: Secondary | ICD-10-CM | POA: Diagnosis not present

## 2020-06-20 DIAGNOSIS — Z5181 Encounter for therapeutic drug level monitoring: Secondary | ICD-10-CM

## 2020-06-20 DIAGNOSIS — I4891 Unspecified atrial fibrillation: Secondary | ICD-10-CM | POA: Diagnosis not present

## 2020-06-20 LAB — POCT INR: INR: 2.8 (ref 2.0–3.0)

## 2020-06-20 NOTE — Patient Instructions (Signed)
Has 1 week of doxcycline left Decrease warfarin to 1 tablet daily except 1/2 tablet on Monday and Thursday till finished with antibiotic then resume 1 tablet daily  Continue greens Recheck in 3 weeks

## 2020-06-23 NOTE — Progress Notes (Signed)
Cardiology Office Note  Date: 06/24/2020   ID: Nashid, Pellum 1928/03/10, MRN [id number]  PCP:  [name], MD  Cardiologist:  Rozann Lesches, MD Electrophysiologist:  None   Chief Complaint  Patient presents with  . Cardiac follow-up    History of Present Illness: Douglas [name] is a 86 y.o. male last seen in May by Ms. Strader PA-C.  He presents for a routine visit.  Reports no progressive angina symptoms on current medical regimen.  He has been working on his garden, also bush-hogging in his pasture.  He feels like he needs to get up and do something every day.  I reviewed his medications which are outlined below.  He reports compliance, no obvious intolerances.  No bleeding problems on Coumadin.  He continues to follow in the anticoagulation clinic, recent INR 2.8.  He sees Dr. Lovena Le, Medtronic pacemaker in place.  Interrogation in August showed normal function with 1.7% AF burden.  Past Medical History:  Diagnosis Date  . Acute upper GI bleed 05/18/2016  . Aneurysm of thoracic aorta (HCC)    Ascending. 5.7 x 5.5cm - Bentall procedure  . Aortic stenosis    Bioprosthetic AVR  . Arthritis   . Atrial fibrillation ([place])    Documented on pacer interrogation 6/13  . Complete heart block ([place])    Status post pacemaker placement  . Coronary atherosclerosis of native coronary artery    Multivessel - LIMA to LAD, SVG to OM, SVG to PDA  . DJD (degenerative joint disease)   . Esophageal dysmotility   . Essential hypertension   . Fibromyalgia   . Gout   . H/O arteriovenous malformation (AVM)   . H/O hiatal hernia   . Hemorrhoid   . History of esophageal ulcer   . History of skin cancer   . Hx of adenomatous colonic polyps   . Iron deficiency anemia due to chronic blood loss   . Melena   . Pacemaker-Medtronic   . PMR (polymyalgia rheumatica) (HCC)   . SIRS (systemic inflammatory response syndrome) ([place]) 01/16/2015    Past Surgical History:  Procedure Laterality Date    . AORTIC VALVE REPLACEMENT  01/24/12  . APPENDECTOMY    . [place] or 1966 lumbar back surgery  . BENTALL PROCEDURE  01/24/2012   Procedure: BENTALL PROCEDURE;  Surgeon: Ivin Poot, MD;  Location: [place] [facility];  Service: Open Heart Surgery;  Laterality: N/A;  . BILATERAL KNEE ARTHROSCOPY    . CATARACT EXTRACTION W/PHACO Left 12/03/2016   Procedure: CATARACT EXTRACTION PHACO AND INTRAOCULAR LENS PLACEMENT LEFT EYE;  Surgeon: Tonny Branch, MD;  [facility];  Service: Ophthalmology;  Laterality: Left;  CDE:  10.87  . CATARACT EXTRACTION W/PHACO Right 12/13/2016   Procedure: CATARACT EXTRACTION PHACO AND INTRAOCULAR LENS PLACEMENT (IOC);  Surgeon: Tonny Branch, MD;  Location[facility];  Service: Ophthalmology;  Laterality: Right;  right cde 11.78  . CORONARY ARTERY BYPASS GRAFT  01/24/2012   Procedure: CORONARY ARTERY BYPASS GRAFTING (CABG);  Surgeon: Ivin Poot, MD;  Location: [facility];  Service: Open Heart Surgery;  Laterality: N/A;  . ESOPHAGOGASTRODUODENOSCOPY  06/17/2012   Procedure: ESOPHAGOGASTRODUODENOSCOPY (EGD);  Surgeon: Rogene Houston, MD;  [facility];  Service: Endoscopy;  Laterality: N/A;  ED  . ESOPHAGOGASTRODUODENOSCOPY N/A 05/19/2016   Procedure: ESOPHAGOGASTRODUODENOSCOPY (EGD);  Surgeon: Danie Binder, MD;  [facility];  Service: Endoscopy;  Laterality: N/A;  . HEMORROIDECTOMY    .  HERNIA REPAIR    . Left inguinal hernia repair    . PACEMAKER PLACEMENT     Medtronic 4/13 - Dr. Rayann Heman  . PERMANENT PACEMAKER INSERTION N/A 02/01/2012   Procedure: PERMANENT PACEMAKER INSERTION;  Surgeon: Thompson Grayer, MD; [place] 1 (serial number [id number])      Current Outpatient Medications  Medication Sig Dispense Refill  . acetaminophen (TYLENOL) 500 MG tablet Take 1,000 mg by mouth every 6 (six) hours as needed for mild pain.    Marland Kitchen allopurinol (ZYLOPRIM) 300 MG tablet Take 300 mg by mouth daily. Take 1  daily    . amLODipine  (NORVASC) 2.5 MG tablet Take 2.5 mg by mouth daily.    . furosemide (LASIX) 40 MG tablet Take 20 mg by mouth daily.    . metoprolol succinate (TOPROL-XL) 25 MG 24 hr tablet TAKE (2) TABLETS BY MOUTH DAILY. 60 tablet 11  . nitroGLYCERIN (NITROSTAT) 0.4 MG SL tablet Place 1 tablet (0.4 mg total) under the tongue every 5 (five) minutes as needed for chest pain. 30 tablet 3  . pantoprazole (PROTONIX) 40 MG tablet TAKE ONE TABLET BY MOUTH ONCE DAILY IN THE MORNING. 30 tablet 6  . pravastatin (PRAVACHOL) 20 MG tablet Take 20 mg by mouth every evening.   11  . sennosides-docusate sodium (SENOKOT-S) 8.6-50 MG tablet Take 1 tablet by mouth 2 (two) times daily.    Marland Kitchen warfarin (COUMADIN) 5 MG tablet Take 1 tablet daily or as directed 90 tablet 3  . amoxicillin (AMOXIL) 500 MG capsule amoxicillin 500 mg capsule     No current facility-administered medications for this visit.   Allergies:  Imdur [isosorbide nitrate]   ROS:   No syncope.  Physical Exam: VS:  BP (!) 144/76   Pulse 84   Ht 5\' 6"  (1.676 m)   Wt 208 lb (94.3 kg)   SpO2 100%   BMI 33.57 kg/m , BMI Body mass index is 33.57 kg/m.  Wt Readings from Last 3 Encounters:  06/24/20 208 lb (94.3 kg)  03/01/20 210 lb (95.3 kg)  11/03/19 203 lb (92.1 kg)    General:  Elderly male, appears comfortable at rest. HEENT: Conjunctiva and lids normal, wearing a mask. Neck: Supple, no elevated JVP or carotid bruits, no thyromegaly. Lungs: Clear to auscultation, nonlabored breathing at rest. Cardiac: Regular rate and rhythm, no S3, 2/6 systolic murmur, no pericardial rub. Extremities: Stable ankle edema, distal pulses 2+.  ECG:  An ECG dated 11/02/2019 was personally reviewed today and demonstrated:  Dual-chamber pacing.  Recent Labwork:  May 2020: BUN 24, creatinine 1.21, potassium 5.1, AST 19, ALT 14, hemoglobin 14.1, platelets 189, cholesterol 151, triglycerides 120, HDL 42, LDL 85  Other Studies Reviewed Today:  Lexiscan Myoview  09/05/2018:  Defect 1: There is a medium defect of mild severity present in the mid inferior, mid inferolateral and apical inferior location.  Findings consistent with a mild to moderate degree of ischemia.  This is a high risk study based primarily on severely reduced LVEF.  Nuclear stress EF: 16%.  ECG showed AV pacing throughout study.  Echocardiogram 09/22/2018: - Left ventricle: The cavity size was normal. Wall thickness was  increased in a pattern of mild LVH. Systolic function was normal.  The estimated ejection fraction was in the range of 55% to 60%.  There is mild hypokinesis of the apical anteroseptal myocardium,  possibly related to RV pacing. Indeterminate diastolic function.  - Aortic valve: History of Bental procedure with  23 mm Edwards  pericardial valve along with 32mm Gelweave Valsalva ascending  aorta graft. Transvalvular velocity is in normal range.  - Mitral valve: Mildly calcified annulus. There was trivial  regurgitation.  - Left atrium: The atrium was mildly to moderately dilated.  - Right ventricle: Pacer wire or catheter noted in right ventricle.  - Right atrium: The atrium was at the upper limits of normal in  size.  - Atrial septum: No defect or patent foramen ovale was identified.  - Tricuspid valve: There was trivial regurgitation.  - Pulmonary arteries: PA peak pressure: 23 mm Hg (S).  - Pericardium, extracardiac: A prominent pericardial fat pad was  present.   Assessment and Plan:  1.  Multivessel CAD status post CABG in 2013.  He reports adequate symptom control on current medications, continue with observation.  He is on Toprol-XL, Norvasc, and Pravachol.  2.  Atrial fibrillation with fairly low rhythm burden based on device interrogation.  He continues on Coumadin with follow-up in anticoagulation clinic.  3.  Complete heart block with Medtronic pacemaker in place followed by Dr. Lovena Le.  4.  Mixed hyperlipidemia on  Pravachol, last LDL 85.  Medication Adjustments/Labs and Tests Ordered: Current medicines are reviewed at length with the patient today.  Concerns regarding medicines are outlined above.   Tests Ordered: No orders of the defined types were placed in this encounter.   Medication Changes: Meds ordered this encounter  Medications  . nitroGLYCERIN (NITROSTAT) 0.4 MG SL tablet    Sig: Place 1 tablet (0.4 mg total) under the tongue every 5 (five) minutes as needed for chest pain.    Dispense:  30 tablet    Refill:  3    Disposition:  Follow up 6 months in the [place] office.  Signed, Satira Sark, MD, [facility] 06/24/2020 8:53 AM    [place] [facility] [address] [place], [place] [address] Phone: [phone]; Fax: [phone]

## 2020-06-24 ENCOUNTER — Encounter: Payer: Self-pay | Admitting: Cardiology

## 2020-06-24 ENCOUNTER — Ambulatory Visit (INDEPENDENT_AMBULATORY_CARE_PROVIDER_SITE_OTHER): Payer: Medicare Other | Admitting: Cardiology

## 2020-06-24 ENCOUNTER — Other Ambulatory Visit: Payer: Self-pay | Admitting: Cardiology

## 2020-06-24 ENCOUNTER — Other Ambulatory Visit: Payer: Self-pay

## 2020-06-24 VITALS — BP 144/76 | HR 84 | Ht 66.0 in | Wt 208.0 lb

## 2020-06-24 DIAGNOSIS — E782 Mixed hyperlipidemia: Secondary | ICD-10-CM | POA: Diagnosis not present

## 2020-06-24 DIAGNOSIS — I25119 Atherosclerotic heart disease of native coronary artery with unspecified angina pectoris: Secondary | ICD-10-CM | POA: Diagnosis not present

## 2020-06-24 DIAGNOSIS — I480 Paroxysmal atrial fibrillation: Secondary | ICD-10-CM | POA: Diagnosis not present

## 2020-06-24 MED ORDER — NITROGLYCERIN 0.4 MG SL SUBL: 0.4000 mg | SUBLINGUAL_TABLET | SUBLINGUAL | 3 refills | Status: DC | PRN

## 2020-06-24 NOTE — Patient Instructions (Signed)
Medication Instructions:  °Your physician recommends that you continue on your current medications as directed. Please refer to the Current Medication list given to you today. ° °*If you need a refill on your cardiac medications before your next appointment, please call your pharmacy* ° ° °Lab Work: °None today °If you have labs (blood work) drawn today and your tests are completely normal, you will receive your results only by: °• MyChart Message (if you have MyChart) OR °• A paper copy in the mail °If you have any lab test that is abnormal or we need to change your treatment, we will call you to review the results. ° ° °Testing/Procedures: °None today ° ° °Follow-Up: °At [facility], you and your health needs are our priority.  As part of our continuing mission to provide you with exceptional heart care, we have created designated Provider Care Teams.  These Care Teams include your primary Cardiologist (physician) and Advanced Practice Providers (APPs -  Physician Assistants and Nurse Practitioners) who all work together to provide you with the care you need, when you need it. ° °We recommend signing up for the patient portal called "MyChart".  Sign up information is provided on this After Visit Summary.  MyChart is used to connect with patients for Virtual Visits (Telemedicine).  Patients are able to view lab/test results, encounter notes, upcoming appointments, etc.  Non-urgent messages can be sent to your provider as well.   °To learn more about what you can do with MyChart, go to [email or web address].   ° °Your next appointment:   °6 month(s) ° °The format for your next appointment:   °In Person ° °Provider:   °Samuel McDowell, MD ° ° °Other Instructions °None ° ° ° ° °Thank you for choosing [facility] ! ° ° ° ° ° ° ° ° °

## 2020-06-30 ENCOUNTER — Ambulatory Visit: Payer: Medicare Other | Admitting: Cardiology

## 2020-07-11 ENCOUNTER — Ambulatory Visit (INDEPENDENT_AMBULATORY_CARE_PROVIDER_SITE_OTHER): Payer: Medicare Other | Admitting: *Deleted

## 2020-07-11 DIAGNOSIS — Z952 Presence of prosthetic heart valve: Secondary | ICD-10-CM

## 2020-07-11 DIAGNOSIS — Z5181 Encounter for therapeutic drug level monitoring: Secondary | ICD-10-CM | POA: Diagnosis not present

## 2020-07-11 DIAGNOSIS — I4891 Unspecified atrial fibrillation: Secondary | ICD-10-CM | POA: Diagnosis not present

## 2020-07-11 LAB — POCT INR: INR: 2.6 (ref 2.0–3.0)

## 2020-07-11 NOTE — Patient Instructions (Signed)
Continue warfarin 1 tablet daily Continue greens Recheck in 6 weeks 

## 2020-07-12 ENCOUNTER — Encounter: Payer: Medicare Other | Admitting: Internal Medicine

## 2020-07-13 DIAGNOSIS — L570 Actinic keratosis: Secondary | ICD-10-CM | POA: Diagnosis not present

## 2020-07-13 DIAGNOSIS — L814 Other melanin hyperpigmentation: Secondary | ICD-10-CM | POA: Diagnosis not present

## 2020-07-13 DIAGNOSIS — L821 Other seborrheic keratosis: Secondary | ICD-10-CM | POA: Diagnosis not present

## 2020-07-13 DIAGNOSIS — D692 Other nonthrombocytopenic purpura: Secondary | ICD-10-CM | POA: Diagnosis not present

## 2020-07-13 DIAGNOSIS — D1801 Hemangioma of skin and subcutaneous tissue: Secondary | ICD-10-CM | POA: Diagnosis not present

## 2020-07-13 DIAGNOSIS — Z85828 Personal history of other malignant neoplasm of skin: Secondary | ICD-10-CM | POA: Diagnosis not present

## 2020-07-13 DIAGNOSIS — D225 Melanocytic nevi of trunk: Secondary | ICD-10-CM | POA: Diagnosis not present

## 2020-07-26 DIAGNOSIS — Z23 Encounter for immunization: Secondary | ICD-10-CM | POA: Diagnosis not present

## 2020-07-28 DIAGNOSIS — M170 Bilateral primary osteoarthritis of knee: Secondary | ICD-10-CM | POA: Diagnosis not present

## 2020-07-28 DIAGNOSIS — M1712 Unilateral primary osteoarthritis, left knee: Secondary | ICD-10-CM | POA: Diagnosis not present

## 2020-07-28 DIAGNOSIS — M1711 Unilateral primary osteoarthritis, right knee: Secondary | ICD-10-CM | POA: Diagnosis not present

## 2020-08-10 ENCOUNTER — Encounter: Payer: Self-pay | Admitting: Internal Medicine

## 2020-08-10 ENCOUNTER — Other Ambulatory Visit: Payer: Self-pay

## 2020-08-10 ENCOUNTER — Ambulatory Visit (INDEPENDENT_AMBULATORY_CARE_PROVIDER_SITE_OTHER): Payer: Medicare Other | Admitting: Internal Medicine

## 2020-08-10 VITALS — BP 136/64 | HR 77 | Ht 66.0 in | Wt 208.0 lb

## 2020-08-10 DIAGNOSIS — I442 Atrioventricular block, complete: Secondary | ICD-10-CM

## 2020-08-10 DIAGNOSIS — I25119 Atherosclerotic heart disease of native coronary artery with unspecified angina pectoris: Secondary | ICD-10-CM

## 2020-08-10 LAB — CUP PACEART INCLINIC DEVICE CHECK
Battery Impedance: 1508 Ohm
Battery Remaining Longevity: 45 mo
Battery Voltage: 2.75 V
Brady Statistic AP VP Percent: 63 %
Brady Statistic AP VS Percent: 0 %
Brady Statistic AS VP Percent: 37 %
Brady Statistic AS VS Percent: 0 %
Date Time Interrogation Session: 20211020091459
Implantable Lead Implant Date: 20130412
Implantable Lead Implant Date: 20130412
Implantable Lead Location: 753859
Implantable Lead Location: 753860
Implantable Lead Model: 5076
Implantable Lead Model: 5076
Implantable Pulse Generator Implant Date: 20130412
Lead Channel Impedance Value: 460 Ohm
Lead Channel Impedance Value: 548 Ohm
Lead Channel Pacing Threshold Amplitude: 0.5 V
Lead Channel Pacing Threshold Amplitude: 0.75 V
Lead Channel Pacing Threshold Pulse Width: 0.4 ms
Lead Channel Pacing Threshold Pulse Width: 0.4 ms
Lead Channel Sensing Intrinsic Amplitude: 0.7 mV
Lead Channel Setting Pacing Amplitude: 1.5 V
Lead Channel Setting Pacing Amplitude: 2 V
Lead Channel Setting Pacing Pulse Width: 0.4 ms
Lead Channel Setting Sensing Sensitivity: 4 mV

## 2020-08-10 NOTE — Progress Notes (Signed)
HPI [name] returns today for followup. He has a h/o Bental/AVR and CHB, s/p PPM insertion several years ago. He has remained on coumadin therapy. He has been vaccinated. He denies chest pain, sob, or syncope. Very mild peripheral edema. No bleeding on coumadin Allergies  Allergen Reactions  . Imdur [Isosorbide Nitrate] Other (See Comments)    Nausea and lightheaded      Current Outpatient Medications  Medication Sig Dispense Refill  . acetaminophen (TYLENOL) 500 MG tablet Take 1,000 mg by mouth every 6 (six) hours as needed for mild pain.    Marland Kitchen allopurinol (ZYLOPRIM) 300 MG tablet Take 300 mg by mouth daily. Take 1  daily    . amLODipine (NORVASC) 2.5 MG tablet Take 2.5 mg by mouth daily.    Marland Kitchen amoxicillin (AMOXIL) 500 MG capsule amoxicillin 500 mg capsule    . furosemide (LASIX) 40 MG tablet Take 20 mg by mouth daily.    . metoprolol succinate (TOPROL-XL) 25 MG 24 hr tablet TAKE (2) TABLETS BY MOUTH DAILY. 60 tablet 11  . nitroGLYCERIN (NITROSTAT) 0.4 MG SL tablet Place 1 tablet (0.4 mg total) under the tongue every 5 (five) minutes as needed for chest pain. 30 tablet 3  . pantoprazole (PROTONIX) 40 MG tablet TAKE ONE TABLET BY MOUTH ONCE DAILY IN THE MORNING. 30 tablet 6  . pravastatin (PRAVACHOL) 20 MG tablet Take 20 mg by mouth every evening.   11  . sennosides-docusate sodium (SENOKOT-S) 8.6-50 MG tablet Take 1 tablet by mouth 2 (two) times daily.    Marland Kitchen warfarin (COUMADIN) 5 MG tablet TAKE 1 TABLET BY MOUTH ONCE DAILY OR AS DIRECTED. 90 tablet 0   No current facility-administered medications for this visit.     Past Medical History:  Diagnosis Date  . Acute upper GI bleed 05/18/2016  . Aneurysm of thoracic aorta (HCC)    Ascending. 5.7 x 5.5cm - Bentall procedure  . Aortic stenosis    Bioprosthetic AVR  . Arthritis   . Atrial fibrillation ([place])    Documented on pacer interrogation 6/13  . Complete heart block ([place])    Status post pacemaker placement  . Coronary  atherosclerosis of native coronary artery    Multivessel - LIMA to LAD, SVG to OM, SVG to PDA  . DJD (degenerative joint disease)   . Esophageal dysmotility   . Essential hypertension   . Fibromyalgia   . Gout   . H/O arteriovenous malformation (AVM)   . H/O hiatal hernia   . Hemorrhoid   . History of esophageal ulcer   . History of skin cancer   . Hx of adenomatous colonic polyps   . Iron deficiency anemia due to chronic blood loss   . Melena   . Pacemaker-Medtronic   . PMR (polymyalgia rheumatica) (HCC)   . SIRS (systemic inflammatory response syndrome) ([place]) 01/16/2015    ROS:   All systems reviewed and negative except as noted in the HPI.   Past Surgical History:  Procedure Laterality Date  . AORTIC VALVE REPLACEMENT  01/24/12  . APPENDECTOMY    . [place] or 1966 lumbar back surgery  . BENTALL PROCEDURE  01/24/2012   Procedure: BENTALL PROCEDURE;  Surgeon: Ivin Poot, MD;  Location: [place];  Service: Open Heart Surgery;  Laterality: N/A;  . BILATERAL KNEE ARTHROSCOPY    . CATARACT EXTRACTION W/PHACO Left 12/03/2016   Procedure: CATARACT EXTRACTION PHACO AND INTRAOCULAR LENS PLACEMENT LEFT EYE;  Surgeon: Tonny Branch, MD;  [facility];  Service: Ophthalmology;  Laterality: Left;  CDE:  10.87  . CATARACT EXTRACTION W/PHACO Right 12/13/2016   Procedure: CATARACT EXTRACTION PHACO AND INTRAOCULAR LENS PLACEMENT (IOC);  Surgeon: Tonny Branch, MD;  Location: [facility];  Service: Ophthalmology;  Laterality: Right;  right cde 11.78  . CORONARY ARTERY BYPASS GRAFT  01/24/2012   Procedure: CORONARY ARTERY BYPASS GRAFTING (CABG);  Surgeon: Ivin Poot, MD;  Location: [facility];  Service: Open Heart Surgery;  Laterality: N/A;  . ESOPHAGOGASTRODUODENOSCOPY  06/17/2012   Procedure: ESOPHAGOGASTRODUODENOSCOPY (EGD);  Surgeon: Rogene Houston, MD;  [facility];  Service: Endoscopy;  Laterality: N/A;  ED  . ESOPHAGOGASTRODUODENOSCOPY N/A 05/19/2016   Procedure:  ESOPHAGOGASTRODUODENOSCOPY (EGD);  Surgeon: Danie Binder, MD;  [facility];  Service: Endoscopy;  Laterality: N/A;  . HEMORROIDECTOMY    . HERNIA REPAIR    . Left inguinal hernia repair    . PACEMAKER PLACEMENT     Medtronic 4/13 - Dr. Rayann Heman  . PERMANENT PACEMAKER INSERTION N/A 02/01/2012   Procedure: PERMANENT PACEMAKER INSERTION;  Surgeon: Thompson Grayer, MD; [place] 1 (serial number [id number])       Family History  Problem Relation Age of Onset  . Kidney disease Mother   . Heart disease Father   . Anesthesia problems Neg Hx   . Hypotension Neg Hx   . Malignant hyperthermia Neg Hx   . Pseudochol deficiency Neg Hx      Social History   Socioeconomic History  . Marital status: Married    Spouse name: Not on file  . Number of children: 4  . Years of education: Not on file  . Highest education level: Not on file  Occupational History  . Occupation: Retired    Comment: Scientist, forensic  Tobacco Use  . Smoking status: Never Smoker  . Smokeless tobacco: Never Used  Vaping Use  . Vaping Use: Never used  Substance and Sexual Activity  . Alcohol use: No    Alcohol/week: 0.0 standard drinks  . Drug use: No  . Sexual activity: Never    Birth control/protection: None  Other Topics Concern  . Not on file  Social History Narrative   Lives with wife, has been married since 42.   Social Determinants of Health   Financial Resource Strain:   . Difficulty of Paying Living Expenses: Not on file  Food Insecurity:   . Worried About Charity fundraiser in the Last Year: Not on file  . Ran Out of Food in the Last Year: Not on file  Transportation Needs:   . Lack of Transportation (Medical): Not on file  . Lack of Transportation (Non-Medical): Not on file  Physical Activity:   . Days of Exercise per Week: Not on file  . Minutes of Exercise per Session: Not on file  Stress:   . Feeling of Stress : Not on file  Social Connections:   .  Frequency of Communication with Friends and Family: Not on file  . Frequency of Social Gatherings with Friends and Family: Not on file  . Attends Religious Services: Not on file  . Active Member of Clubs or Organizations: Not on file  . Attends Archivist Meetings: Not on file  . Marital Status: Not on file  Intimate Partner Violence:   . Fear of Current or Ex-Partner: Not on file  . Emotionally Abused: Not on file  . Physically  Abused: Not on file  . Sexually Abused: Not on file     BP 136/64   Pulse 77   Ht 5\' 6"  (1.676 m)   Wt 208 lb (94.3 kg)   SpO2 99%   BMI 33.57 kg/m   Physical Exam:  Well appearing NAD HEENT: Unremarkable Neck:  No JVD, no thyromegally Lymphatics:  No adenopathy Back:  No CVA tenderness Lungs:  Clear HEART:  Regular rate rhythm, no murmurs, no rubs, no clicks Abd:  soft, positive bowel sounds, no organomegally, no rebound, no guarding Ext:  2 plus pulses, no edema, no cyanosis, no clubbing Skin:  No rashes no nodules Neuro:  CN II through XII intact, motor grossly intact   DEVICE  Normal device function.  See PaceArt for details.   Assess/Plan: 1. CHB - he is asymptomatic, s/p PPM insertion. No escape. 2. PAF - he is out of rhythm 1.5% of the time and is minimally symptomatic. He will undergo watchful waiting. 3. HTN - his bp is fairly well controlled. 4. PPM - his medtronic DDD PM is working normally. He will continue his current meds.  Carleene Overlie Klair Leising,MD

## 2020-08-10 NOTE — Patient Instructions (Signed)
Medication Instructions:  Your physician recommends that you continue on your current medications as directed. Please refer to the Current Medication list given to you today.  *If you need a refill on your cardiac medications before your next appointment, please call your pharmacy*   Lab Work: NONE   If you have labs (blood work) drawn today and your tests are completely normal, you will receive your results only by: . MyChart Message (if you have MyChart) OR . A paper copy in the mail If you have any lab test that is abnormal or we need to change your treatment, we will call you to review the results.   Testing/Procedures: NONE    Follow-Up: At [facility], you and your health needs are our priority.  As part of our continuing mission to provide you with exceptional heart care, we have created designated Provider Care Teams.  These Care Teams include your primary Cardiologist (physician) and Advanced Practice Providers (APPs -  Physician Assistants and Nurse Practitioners) who all work together to provide you with the care you need, when you need it.  We recommend signing up for the patient portal called "MyChart".  Sign up information is provided on this After Visit Summary.  MyChart is used to connect with patients for Virtual Visits (Telemedicine).  Patients are able to view lab/test results, encounter notes, upcoming appointments, etc.  Non-urgent messages can be sent to your provider as well.   To learn more about what you can do with MyChart, go to [email or web address].    Your next appointment:   1 year(s)  The format for your next appointment:   In Person  Provider:   Gregg Taylor, MD   Other Instructions Thank you for choosing [facility]!    

## 2020-08-22 ENCOUNTER — Ambulatory Visit (INDEPENDENT_AMBULATORY_CARE_PROVIDER_SITE_OTHER): Payer: Medicare Other | Admitting: *Deleted

## 2020-08-22 DIAGNOSIS — Z5181 Encounter for therapeutic drug level monitoring: Secondary | ICD-10-CM | POA: Diagnosis not present

## 2020-08-22 DIAGNOSIS — Z952 Presence of prosthetic heart valve: Secondary | ICD-10-CM

## 2020-08-22 DIAGNOSIS — I4891 Unspecified atrial fibrillation: Secondary | ICD-10-CM

## 2020-08-22 LAB — POCT INR: INR: 2.4 (ref 2.0–3.0)

## 2020-08-22 NOTE — Patient Instructions (Signed)
Continue warfarin 1 tablet daily Continue greens Recheck in 6 weeks 

## 2020-08-23 ENCOUNTER — Ambulatory Visit (INDEPENDENT_AMBULATORY_CARE_PROVIDER_SITE_OTHER): Payer: Medicare Other

## 2020-08-23 DIAGNOSIS — I442 Atrioventricular block, complete: Secondary | ICD-10-CM

## 2020-08-23 LAB — CUP PACEART REMOTE DEVICE CHECK
Battery Impedance: 1510 Ohm
Battery Remaining Longevity: 45 mo
Battery Voltage: 2.75 V
Brady Statistic AP VP Percent: 74 %
Brady Statistic AP VS Percent: 0 %
Brady Statistic AS VP Percent: 26 %
Brady Statistic AS VS Percent: 0 %
Date Time Interrogation Session: 20211102082406
Implantable Lead Implant Date: 20130412
Implantable Lead Implant Date: 20130412
Implantable Lead Location: 753859
Implantable Lead Location: 753860
Implantable Lead Model: 5076
Implantable Lead Model: 5076
Implantable Pulse Generator Implant Date: 20130412
Lead Channel Impedance Value: 473 Ohm
Lead Channel Impedance Value: 556 Ohm
Lead Channel Pacing Threshold Amplitude: 0.5 V
Lead Channel Pacing Threshold Amplitude: 0.75 V
Lead Channel Pacing Threshold Pulse Width: 0.4 ms
Lead Channel Pacing Threshold Pulse Width: 0.4 ms
Lead Channel Setting Pacing Amplitude: 1.5 V
Lead Channel Setting Pacing Amplitude: 2 V
Lead Channel Setting Pacing Pulse Width: 0.4 ms
Lead Channel Setting Sensing Sensitivity: 4 mV

## 2020-08-25 ENCOUNTER — Ambulatory Visit: Payer: Medicare Other | Attending: Internal Medicine

## 2020-08-25 DIAGNOSIS — Z23 Encounter for immunization: Secondary | ICD-10-CM

## 2020-08-25 NOTE — Progress Notes (Signed)
   Covid-19 Vaccination Clinic  Name:  RISHAN OYAMA    MRN: [id number] DOB: 06/24/1928  08/25/2020  Mr. Candelas was observed post Covid-19 immunization for 15 minutes without incident. He was provided with Vaccine Information Sheet and instruction to access the V-Safe system.   Mr. Newmark was instructed to call 911 with any severe reactions post vaccine: Marland Kitchen Difficulty breathing  . Swelling of face and throat  . A fast heartbeat  . A bad rash all over body  . Dizziness and weakness

## 2020-08-25 NOTE — Progress Notes (Signed)
Remote pacemaker transmission.   

## 2020-09-19 DIAGNOSIS — Z79899 Other long term (current) drug therapy: Secondary | ICD-10-CM | POA: Diagnosis not present

## 2020-09-19 DIAGNOSIS — I4821 Permanent atrial fibrillation: Secondary | ICD-10-CM | POA: Diagnosis not present

## 2020-09-19 DIAGNOSIS — I2510 Atherosclerotic heart disease of native coronary artery without angina pectoris: Secondary | ICD-10-CM | POA: Diagnosis not present

## 2020-09-26 ENCOUNTER — Other Ambulatory Visit: Payer: Self-pay | Admitting: Cardiology

## 2020-09-26 DIAGNOSIS — I480 Paroxysmal atrial fibrillation: Secondary | ICD-10-CM | POA: Diagnosis not present

## 2020-09-26 DIAGNOSIS — E870 Hyperosmolality and hypernatremia: Secondary | ICD-10-CM | POA: Diagnosis not present

## 2020-09-26 DIAGNOSIS — I10 Essential (primary) hypertension: Secondary | ICD-10-CM | POA: Diagnosis not present

## 2020-10-03 ENCOUNTER — Ambulatory Visit (INDEPENDENT_AMBULATORY_CARE_PROVIDER_SITE_OTHER): Payer: Medicare Other | Admitting: *Deleted

## 2020-10-03 ENCOUNTER — Other Ambulatory Visit: Payer: Self-pay

## 2020-10-03 DIAGNOSIS — Z5181 Encounter for therapeutic drug level monitoring: Secondary | ICD-10-CM

## 2020-10-03 DIAGNOSIS — Z952 Presence of prosthetic heart valve: Secondary | ICD-10-CM | POA: Diagnosis not present

## 2020-10-03 DIAGNOSIS — I4891 Unspecified atrial fibrillation: Secondary | ICD-10-CM

## 2020-10-03 LAB — POCT INR: INR: 2.3 (ref 2.0–3.0)

## 2020-10-03 NOTE — Patient Instructions (Signed)
Continue warfarin 1 tablet daily Continue greens Recheck in 6 weeks 

## 2020-10-31 DIAGNOSIS — R0981 Nasal congestion: Secondary | ICD-10-CM | POA: Diagnosis not present

## 2020-11-08 DIAGNOSIS — M25572 Pain in left ankle and joints of left foot: Secondary | ICD-10-CM | POA: Insufficient documentation

## 2020-11-14 ENCOUNTER — Ambulatory Visit (INDEPENDENT_AMBULATORY_CARE_PROVIDER_SITE_OTHER): Payer: Medicare Other | Admitting: *Deleted

## 2020-11-14 ENCOUNTER — Other Ambulatory Visit: Payer: Self-pay

## 2020-11-14 DIAGNOSIS — Z952 Presence of prosthetic heart valve: Secondary | ICD-10-CM

## 2020-11-14 DIAGNOSIS — Z5181 Encounter for therapeutic drug level monitoring: Secondary | ICD-10-CM

## 2020-11-14 DIAGNOSIS — I4891 Unspecified atrial fibrillation: Secondary | ICD-10-CM | POA: Diagnosis not present

## 2020-11-14 LAB — POCT INR: INR: 3.3 — AB (ref 2.0–3.0)

## 2020-11-14 NOTE — Patient Instructions (Signed)
On Doxycycline 100mg  bid thru 11/22/20 Hold warfarin tonight then decrease dose to 1 tablet daily except 1/2 tablet on Friday till finished with Doxycycline then resume 1 tablet daily  Continue greens Recheck in 3 weeks

## 2020-11-15 DIAGNOSIS — S8012XD Contusion of left lower leg, subsequent encounter: Secondary | ICD-10-CM | POA: Diagnosis not present

## 2020-11-22 ENCOUNTER — Ambulatory Visit (INDEPENDENT_AMBULATORY_CARE_PROVIDER_SITE_OTHER): Payer: Medicare Other

## 2020-11-22 DIAGNOSIS — I442 Atrioventricular block, complete: Secondary | ICD-10-CM | POA: Diagnosis not present

## 2020-11-23 ENCOUNTER — Telehealth: Payer: Self-pay

## 2020-11-23 LAB — CUP PACEART REMOTE DEVICE CHECK
Battery Impedance: 1619 Ohm
Battery Remaining Longevity: 42 mo
Battery Voltage: 2.75 V
Brady Statistic AP VP Percent: 76 %
Brady Statistic AP VS Percent: 0 %
Brady Statistic AS VP Percent: 24 %
Brady Statistic AS VS Percent: 0 %
Date Time Interrogation Session: 20220202105536
Implantable Lead Implant Date: 20130412
Implantable Lead Implant Date: 20130412
Implantable Lead Location: 753859
Implantable Lead Location: 753860
Implantable Lead Model: 5076
Implantable Lead Model: 5076
Implantable Pulse Generator Implant Date: 20130412
Lead Channel Impedance Value: 467 Ohm
Lead Channel Impedance Value: 541 Ohm
Lead Channel Pacing Threshold Amplitude: 0.5 V
Lead Channel Pacing Threshold Amplitude: 0.625 V
Lead Channel Pacing Threshold Pulse Width: 0.4 ms
Lead Channel Pacing Threshold Pulse Width: 0.4 ms
Lead Channel Setting Pacing Amplitude: 1.5 V
Lead Channel Setting Pacing Amplitude: 2 V
Lead Channel Setting Pacing Pulse Width: 0.4 ms
Lead Channel Setting Sensing Sensitivity: 4 mV

## 2020-11-23 NOTE — Telephone Encounter (Signed)
Carelink transmission received-  AF burden 56.2%; longest >96 hours; EGMs suggest A. Fib - ongoing; V rates controlled. History of A. Fib and on metoprolol and warfarin.   Pt has history of known PAF with low burden,asymptomatic.  Per last office note by Dr. Lovena Le "watchful waiting"    Pt medications include Coumadin and Metoprolol.   Attempted to reach pt to disucss, no answer.  No DPR on file, left general message on VM requesting callback to DC clinic.

## 2020-11-24 NOTE — Telephone Encounter (Signed)
The patient returning the nurse phone call.

## 2020-11-24 NOTE — Telephone Encounter (Signed)
Pt returned phone call.  Advised patient of the purpose of the call, it appears he has had an increase in AF burden, it appears he has been in AF since early December.    Pt reports he was unaware of being in AF, he has not had any cardiac symptoms.  Per pt he is currently inactive/ laid up due to an issue with his leg.  Pt confirms compliance with Metoprolol and Coumadin as directed.     When I tried to question patient about what was wrong woth his leg, he abruptly hung up.    Pt has a f/u appt with Dr. Domenic Polite in [place] office 12/28/20

## 2020-12-01 NOTE — Telephone Encounter (Signed)
No change in treatment indicated if he is asymptomatic and on coumadin and rate control. GT

## 2020-12-01 NOTE — Progress Notes (Signed)
Remote pacemaker transmission.   

## 2020-12-05 ENCOUNTER — Ambulatory Visit (INDEPENDENT_AMBULATORY_CARE_PROVIDER_SITE_OTHER): Payer: Medicare Other | Admitting: *Deleted

## 2020-12-05 DIAGNOSIS — Z952 Presence of prosthetic heart valve: Secondary | ICD-10-CM | POA: Diagnosis not present

## 2020-12-05 DIAGNOSIS — Z5181 Encounter for therapeutic drug level monitoring: Secondary | ICD-10-CM

## 2020-12-05 DIAGNOSIS — I4891 Unspecified atrial fibrillation: Secondary | ICD-10-CM

## 2020-12-05 LAB — POCT INR: INR: 3.1 — AB (ref 2.0–3.0)

## 2020-12-05 NOTE — Patient Instructions (Signed)
Take warfarin 1/2 tablet tonight then resume 1 tablet daily  Continue greens Recheck in 4 weeks

## 2020-12-19 DIAGNOSIS — I2510 Atherosclerotic heart disease of native coronary artery without angina pectoris: Secondary | ICD-10-CM | POA: Diagnosis not present

## 2020-12-19 DIAGNOSIS — I10 Essential (primary) hypertension: Secondary | ICD-10-CM | POA: Diagnosis not present

## 2020-12-19 DIAGNOSIS — Z79899 Other long term (current) drug therapy: Secondary | ICD-10-CM | POA: Diagnosis not present

## 2020-12-19 DIAGNOSIS — E870 Hyperosmolality and hypernatremia: Secondary | ICD-10-CM | POA: Diagnosis not present

## 2020-12-22 DIAGNOSIS — H0102A Squamous blepharitis right eye, upper and lower eyelids: Secondary | ICD-10-CM | POA: Diagnosis not present

## 2020-12-22 DIAGNOSIS — H0102B Squamous blepharitis left eye, upper and lower eyelids: Secondary | ICD-10-CM | POA: Diagnosis not present

## 2020-12-22 DIAGNOSIS — H11121 Conjunctival concretions, right eye: Secondary | ICD-10-CM | POA: Diagnosis not present

## 2020-12-26 ENCOUNTER — Other Ambulatory Visit: Payer: Self-pay | Admitting: Cardiology

## 2020-12-26 DIAGNOSIS — I10 Essential (primary) hypertension: Secondary | ICD-10-CM | POA: Diagnosis not present

## 2020-12-26 DIAGNOSIS — I711 Thoracic aortic aneurysm, ruptured: Secondary | ICD-10-CM | POA: Diagnosis not present

## 2020-12-26 DIAGNOSIS — I480 Paroxysmal atrial fibrillation: Secondary | ICD-10-CM | POA: Diagnosis not present

## 2020-12-28 ENCOUNTER — Encounter: Payer: Self-pay | Admitting: Cardiology

## 2020-12-28 ENCOUNTER — Other Ambulatory Visit: Payer: Self-pay

## 2020-12-28 ENCOUNTER — Ambulatory Visit (INDEPENDENT_AMBULATORY_CARE_PROVIDER_SITE_OTHER): Payer: Medicare Other | Admitting: Cardiology

## 2020-12-28 VITALS — BP 132/70 | HR 70 | Ht 66.0 in | Wt 209.6 lb

## 2020-12-28 DIAGNOSIS — I442 Atrioventricular block, complete: Secondary | ICD-10-CM

## 2020-12-28 DIAGNOSIS — I25119 Atherosclerotic heart disease of native coronary artery with unspecified angina pectoris: Secondary | ICD-10-CM | POA: Diagnosis not present

## 2020-12-28 DIAGNOSIS — I4819 Other persistent atrial fibrillation: Secondary | ICD-10-CM

## 2020-12-28 NOTE — Progress Notes (Signed)
Cardiology Office Note  Date: 12/28/2020   ID: KAMIR SELOVER, DOB 08-Feb-1928, MRN [id number]  PCP:  [name], MD  Cardiologist:  Rozann Lesches, MD Electrophysiologist:  None   Chief Complaint  Patient presents with  . Cardiac follow-up    History of Present Illness: Douglas [name] is a 87 y.o. male last seen in September 2021.  He is here today for a follow-up visit.  Reports no awareness of palpitations, no change in stamina.  He continues to follow with Dr. Lovena Le, Medtronic pacemaker in place.  Recent device check in February demonstrated 56% AF burden, controlled ventricular rates.  Rhythm burden has increased since last assessment.  He is on Coumadin with follow-up in anticoagulation clinic.  Recent INR 3.1.  I reviewed the remainder of his medications which are outlined below.  He had recent lab work as well.  I personally reviewed his ECG which shows a ventricular paced rhythm, underlying atrial fibrillation.  Past Medical History:  Diagnosis Date  . Acute upper GI bleed 05/18/2016  . Aneurysm of thoracic aorta (HCC)    Ascending. 5.7 x 5.5cm - Bentall procedure  . Aortic stenosis    Bioprosthetic AVR  . Arthritis   . Atrial fibrillation ([place])    Documented on pacer interrogation 6/13  . Complete heart block ([place])    Status post pacemaker placement  . Coronary atherosclerosis of native coronary artery    Multivessel - LIMA to LAD, SVG to OM, SVG to PDA  . DJD (degenerative joint disease)   . Esophageal dysmotility   . Essential hypertension   . Fibromyalgia   . Gout   . H/O arteriovenous malformation (AVM)   . H/O hiatal hernia   . Hemorrhoid   . History of esophageal ulcer   . History of skin cancer   . Hx of adenomatous colonic polyps   . Iron deficiency anemia due to chronic blood loss   . Melena   . Pacemaker-Medtronic   . PMR (polymyalgia rheumatica) (HCC)   . SIRS (systemic inflammatory response syndrome) ([place]) 01/16/2015    Past Surgical  History:  Procedure Laterality Date  . AORTIC VALVE REPLACEMENT  01/24/12  . APPENDECTOMY    . [place] or 1966 lumbar back surgery  . BENTALL PROCEDURE  01/24/2012   Procedure: BENTALL PROCEDURE;  Surgeon: Ivin Poot, MD;  Location: [place];  Service: Open Heart Surgery;  Laterality: N/A;  . BILATERAL KNEE ARTHROSCOPY    . CATARACT EXTRACTION W/PHACO Left 12/03/2016   Procedure: CATARACT EXTRACTION PHACO AND INTRAOCULAR LENS PLACEMENT LEFT EYE;  Surgeon: Tonny Branch, MD;  [facility];  Service: Ophthalmology;  Laterality: Left;  CDE:  10.87  . CATARACT EXTRACTION W/PHACO Right 12/13/2016   Procedure: CATARACT EXTRACTION PHACO AND INTRAOCULAR LENS PLACEMENT (IOC);  Surgeon: Tonny Branch, MD;  Location: [facility];  Service: Ophthalmology;  Laterality: Right;  right cde 11.78  . CORONARY ARTERY BYPASS GRAFT  01/24/2012   Procedure: CORONARY ARTERY BYPASS GRAFTING (CABG);  Surgeon: Ivin Poot, MD;  Location: [place];  Service: Open Heart Surgery;  Laterality: N/A;  . ESOPHAGOGASTRODUODENOSCOPY  06/17/2012   Procedure: ESOPHAGOGASTRODUODENOSCOPY (EGD);  Surgeon: Rogene Houston, MD;  [facility];  Service: Endoscopy;  Laterality: N/A;  ED  . ESOPHAGOGASTRODUODENOSCOPY N/A 05/19/2016   Procedure: ESOPHAGOGASTRODUODENOSCOPY (EGD);  Surgeon: Danie Binder, MD;  [facility];  Service: Endoscopy;  Laterality: N/A;  . HEMORROIDECTOMY    .  HERNIA REPAIR    . Left inguinal hernia repair    . PACEMAKER PLACEMENT     Medtronic 4/13 - Dr. Rayann Heman  . PERMANENT PACEMAKER INSERTION N/A 02/01/2012   Procedure: PERMANENT PACEMAKER INSERTION;  Surgeon: Thompson Grayer, MD; [place] 1 (serial number [id number])      Current Outpatient Medications  Medication Sig Dispense Refill  . acetaminophen (TYLENOL) 500 MG tablet Take 1,000 mg by mouth every 6 (six) hours as needed for mild pain.    Marland Kitchen allopurinol (ZYLOPRIM) 300 MG tablet Take 300 mg by mouth daily.  Take 1  daily    . amLODipine (NORVASC) 2.5 MG tablet Take 2.5 mg by mouth daily.    Marland Kitchen amoxicillin (AMOXIL) 500 MG capsule amoxicillin 500 mg capsule    . furosemide (LASIX) 40 MG tablet Take 20 mg by mouth daily.    . metoprolol succinate (TOPROL-XL) 25 MG 24 hr tablet TAKE (2) TABLETS BY MOUTH DAILY. 180 tablet 3  . nitroGLYCERIN (NITROSTAT) 0.4 MG SL tablet Place 1 tablet (0.4 mg total) under the tongue every 5 (five) minutes as needed for chest pain. 30 tablet 3  . pantoprazole (PROTONIX) 40 MG tablet TAKE ONE TABLET BY MOUTH ONCE DAILY IN THE MORNING. 30 tablet 6  . pravastatin (PRAVACHOL) 20 MG tablet Take 20 mg by mouth every evening.   11  . sennosides-docusate sodium (SENOKOT-S) 8.6-50 MG tablet Take 1 tablet by mouth 2 (two) times daily.    Marland Kitchen warfarin (COUMADIN) 5 MG tablet TAKE 1 TABLET BY MOUTH ONCE DAILY OR AS DIRECTED. 90 tablet 4   No current facility-administered medications for this visit.   Allergies:  Imdur [isosorbide nitrate]   ROS: No syncope.  Physical Exam: VS:  BP 132/70   Pulse 70   Ht 5\' 6"  (1.676 m)   Wt 209 lb 9.6 oz (95.1 kg)   SpO2 98%   BMI 33.83 kg/m , BMI Body mass index is 33.83 kg/m.  Wt Readings from Last 3 Encounters:  12/28/20 209 lb 9.6 oz (95.1 kg)  08/10/20 208 lb (94.3 kg)  06/24/20 208 lb (94.3 kg)    General: Elderly male, appears comfortable at rest. HEENT: Conjunctiva and lids normal, wearing a mask. Neck: Supple, no elevated JVP or carotid bruits, no thyromegaly. Lungs: Clear to auscultation, nonlabored breathing at rest. Cardiac: Regular rate and rhythm, no S3, 2/6 systolic murmur, no pericardial rub. Extremities: No pitting edema.  ECG:  An ECG dated 11/02/2019 was personally reviewed today and demonstrated:  Dual-chamber pacing.  Recent Labwork:  May 2020: BUN 24, creatinine 1.21, potassium 5.1, AST 19, ALT 14, hemoglobin 14.1, platelets 189, cholesterol 151, triglycerides 120, HDL 42, LDL 85  Other Studies Reviewed  Today:  Lexiscan Myoview 09/05/2018:  Defect 1: There is a medium defect of mild severity present in the mid inferior, mid inferolateral and apical inferior location.  Findings consistent with a mild to moderate degree of ischemia.  This is a high risk study based primarily on severely reduced LVEF.  Nuclear stress EF: 16%.  ECG showed AV pacing throughout study.  Echocardiogram 09/22/2018: - Left ventricle: The cavity size was normal. Wall thickness was  increased in a pattern of mild LVH. Systolic function was normal.  The estimated ejection fraction was in the range of 55% to 60%.  There is mild hypokinesis of the apical anteroseptal myocardium,  possibly related to RV pacing. Indeterminate diastolic function.  - Aortic valve: History of Bental procedure with  23 mm Edwards  pericardial valve along with 55mm Gelweave Valsalva ascending  aorta graft. Transvalvular velocity is in normal range.  - Mitral valve: Mildly calcified annulus. There was trivial  regurgitation.  - Left atrium: The atrium was mildly to moderately dilated.  - Right ventricle: Pacer wire or catheter noted in right ventricle.  - Right atrium: The atrium was at the upper limits of normal in  size.  - Atrial septum: No defect or patent foramen ovale was identified.  - Tricuspid valve: There was trivial regurgitation.  - Pulmonary arteries: PA peak pressure: 23 mm Hg (S).  - Pericardium, extracardiac: A prominent pericardial fat pad was  present.   Assessment and Plan:  1.  Persistent atrial fibrillation, CHA2DS2-VASc score is 4.  He is on Coumadin for stroke prophylaxis with follow-up in the anticoagulation clinic.  Rhythm burden has increased based on device interrogation, although he remains asymptomatic and has had no rapid rates.  Continue Toprol-XL.  2.  History of complete heart block, Medtronic pacemaker in place with follow-up by Dr. Lovena Le.  3.  Multivessel CAD status post CABG in  2013.  No active angina at this time.  Continue observation on Norvasc, Toprol-XL, and Pravachol.  As needed nitroglycerin is available.  Medication Adjustments/Labs and Tests Ordered: Current medicines are reviewed at length with the patient today.  Concerns regarding medicines are outlined above.   Tests Ordered: No orders of the defined types were placed in this encounter.   Medication Changes: No orders of the defined types were placed in this encounter.   Disposition:  Follow up 6 months in the [place] office.  Signed, Satira Sark, MD, [facility] 12/28/2020 2:38 PM    [facility] at [facility] [address] [place], [place] [address] Phone: [phone]; Fax: [phone]

## 2020-12-28 NOTE — Addendum Note (Signed)
Addended by: Berlinda Last on: 12/28/2020 04:27 PM   Modules accepted: Orders

## 2020-12-28 NOTE — Patient Instructions (Signed)
Medication Instructions:  Your physician recommends that you continue on your current medications as directed. Please refer to the Current Medication list given to you today.  *If you need a refill on your cardiac medications before your next appointment, please call your pharmacy*   Lab Work: None If you have labs (blood work) drawn today and your tests are completely normal, you will receive your results only by: Marland Kitchen MyChart Message (if you have MyChart) OR . A paper copy in the mail If you have any lab test that is abnormal or we need to change your treatment, we will call you to review the results.   Testing/Procedures: None   Follow-Up: At [facility], you and your health needs are our priority.  As part of our continuing mission to provide you with exceptional heart care, we have created designated Provider Care Teams.  These Care Teams include your primary Cardiologist (physician) and Advanced Practice Providers (APPs -  Physician Assistants and Nurse Practitioners) who all work together to provide you with the care you need, when you need it.  We recommend signing up for the patient portal called "MyChart".  Sign up information is provided on this After Visit Summary.  MyChart is used to connect with patients for Virtual Visits (Telemedicine).  Patients are able to view lab/test results, encounter notes, upcoming appointments, etc.  Non-urgent messages can be sent to your provider as well.   To learn more about what you can do with MyChart, go to [email or web address].    Your next appointment:   6 month(s)  The format for your next appointment:   In Person  Provider:   Rozann Lesches, MD   Other Instructions None

## 2021-01-02 ENCOUNTER — Ambulatory Visit (INDEPENDENT_AMBULATORY_CARE_PROVIDER_SITE_OTHER): Payer: Medicare Other | Admitting: *Deleted

## 2021-01-02 ENCOUNTER — Other Ambulatory Visit: Payer: Self-pay

## 2021-01-02 DIAGNOSIS — I4891 Unspecified atrial fibrillation: Secondary | ICD-10-CM | POA: Diagnosis not present

## 2021-01-02 DIAGNOSIS — Z952 Presence of prosthetic heart valve: Secondary | ICD-10-CM

## 2021-01-02 DIAGNOSIS — Z5181 Encounter for therapeutic drug level monitoring: Secondary | ICD-10-CM

## 2021-01-02 LAB — POCT INR: INR: 2.9 (ref 2.0–3.0)

## 2021-01-02 NOTE — Patient Instructions (Signed)
Continue warfarin 1 tablet daily Continue greens Recheck in 5 weeks 

## 2021-02-06 ENCOUNTER — Ambulatory Visit (INDEPENDENT_AMBULATORY_CARE_PROVIDER_SITE_OTHER): Payer: Medicare Other | Admitting: *Deleted

## 2021-02-06 ENCOUNTER — Other Ambulatory Visit: Payer: Self-pay

## 2021-02-06 DIAGNOSIS — Z952 Presence of prosthetic heart valve: Secondary | ICD-10-CM | POA: Diagnosis not present

## 2021-02-06 DIAGNOSIS — I4891 Unspecified atrial fibrillation: Secondary | ICD-10-CM | POA: Diagnosis not present

## 2021-02-06 DIAGNOSIS — I4821 Permanent atrial fibrillation: Secondary | ICD-10-CM | POA: Diagnosis not present

## 2021-02-06 DIAGNOSIS — Z5181 Encounter for therapeutic drug level monitoring: Secondary | ICD-10-CM | POA: Diagnosis not present

## 2021-02-06 LAB — POCT INR: INR: 2.6 (ref 2.0–3.0)

## 2021-02-06 NOTE — Patient Instructions (Signed)
Continue warfarin 1 tablet daily Continue greens Recheck in 6 weeks 

## 2021-02-14 ENCOUNTER — Encounter (INDEPENDENT_AMBULATORY_CARE_PROVIDER_SITE_OTHER): Payer: Self-pay | Admitting: *Deleted

## 2021-02-21 ENCOUNTER — Ambulatory Visit (INDEPENDENT_AMBULATORY_CARE_PROVIDER_SITE_OTHER): Payer: Medicare Other

## 2021-02-21 DIAGNOSIS — I442 Atrioventricular block, complete: Secondary | ICD-10-CM

## 2021-02-22 LAB — CUP PACEART REMOTE DEVICE CHECK
Battery Impedance: 1675 Ohm
Battery Remaining Longevity: 40 mo
Battery Voltage: 2.75 V
Brady Statistic AP VP Percent: 76 %
Brady Statistic AP VS Percent: 0 %
Brady Statistic AS VP Percent: 24 %
Brady Statistic AS VS Percent: 0 %
Date Time Interrogation Session: 20220503185131
Implantable Lead Implant Date: 20130412
Implantable Lead Implant Date: 20130412
Implantable Lead Location: 753859
Implantable Lead Location: 753860
Implantable Lead Model: 5076
Implantable Lead Model: 5076
Implantable Pulse Generator Implant Date: 20130412
Lead Channel Impedance Value: 442 Ohm
Lead Channel Impedance Value: 526 Ohm
Lead Channel Pacing Threshold Amplitude: 0.5 V
Lead Channel Pacing Threshold Amplitude: 0.875 V
Lead Channel Pacing Threshold Pulse Width: 0.4 ms
Lead Channel Pacing Threshold Pulse Width: 0.4 ms
Lead Channel Setting Pacing Amplitude: 1.5 V
Lead Channel Setting Pacing Amplitude: 2 V
Lead Channel Setting Pacing Pulse Width: 0.4 ms
Lead Channel Setting Sensing Sensitivity: 4 mV

## 2021-02-24 ENCOUNTER — Ambulatory Visit (HOSPITAL_COMMUNITY)
Admission: RE | Admit: 2021-02-24 | Discharge: 2021-02-24 | Disposition: A | Discharge status: Home / Self Care | Payer: Medicare Other | Source: Ambulatory Visit | Attending: Internal Medicine | Admitting: Internal Medicine

## 2021-02-24 ENCOUNTER — Other Ambulatory Visit: Payer: Self-pay

## 2021-02-24 ENCOUNTER — Other Ambulatory Visit (HOSPITAL_COMMUNITY): Payer: Self-pay | Admitting: Internal Medicine

## 2021-02-24 DIAGNOSIS — J9811 Atelectasis: Secondary | ICD-10-CM | POA: Diagnosis not present

## 2021-02-24 DIAGNOSIS — R079 Chest pain, unspecified: Secondary | ICD-10-CM | POA: Diagnosis not present

## 2021-02-24 DIAGNOSIS — I482 Chronic atrial fibrillation, unspecified: Secondary | ICD-10-CM | POA: Diagnosis not present

## 2021-03-03 ENCOUNTER — Emergency Department (HOSPITAL_COMMUNITY): Payer: Medicare Other

## 2021-03-03 ENCOUNTER — Other Ambulatory Visit: Payer: Self-pay

## 2021-03-03 ENCOUNTER — Encounter (HOSPITAL_COMMUNITY): Payer: Self-pay | Admitting: Emergency Medicine

## 2021-03-03 ENCOUNTER — Emergency Department (HOSPITAL_COMMUNITY)
Admission: EM | Admit: 2021-03-03 | Discharge: 2021-03-03 | Disposition: A | Discharge status: Home / Self Care | Payer: Medicare Other | Attending: Emergency Medicine | Admitting: Emergency Medicine

## 2021-03-03 DIAGNOSIS — J984 Other disorders of lung: Secondary | ICD-10-CM | POA: Diagnosis not present

## 2021-03-03 DIAGNOSIS — S300XXA Contusion of lower back and pelvis, initial encounter: Secondary | ICD-10-CM

## 2021-03-03 DIAGNOSIS — Z85828 Personal history of other malignant neoplasm of skin: Secondary | ICD-10-CM | POA: Diagnosis not present

## 2021-03-03 DIAGNOSIS — Z950 Presence of cardiac pacemaker: Secondary | ICD-10-CM | POA: Diagnosis not present

## 2021-03-03 DIAGNOSIS — Z7901 Long term (current) use of anticoagulants: Secondary | ICD-10-CM | POA: Diagnosis not present

## 2021-03-03 DIAGNOSIS — S20229A Contusion of unspecified back wall of thorax, initial encounter: Secondary | ICD-10-CM

## 2021-03-03 DIAGNOSIS — Z951 Presence of aortocoronary bypass graft: Secondary | ICD-10-CM | POA: Diagnosis not present

## 2021-03-03 DIAGNOSIS — I10 Essential (primary) hypertension: Secondary | ICD-10-CM | POA: Insufficient documentation

## 2021-03-03 DIAGNOSIS — T1490XA Injury, unspecified, initial encounter: Secondary | ICD-10-CM

## 2021-03-03 DIAGNOSIS — Z043 Encounter for examination and observation following other accident: Secondary | ICD-10-CM | POA: Diagnosis not present

## 2021-03-03 DIAGNOSIS — S299XXA Unspecified injury of thorax, initial encounter: Secondary | ICD-10-CM | POA: Diagnosis present

## 2021-03-03 DIAGNOSIS — Z79899 Other long term (current) drug therapy: Secondary | ICD-10-CM | POA: Diagnosis not present

## 2021-03-03 DIAGNOSIS — I4891 Unspecified atrial fibrillation: Secondary | ICD-10-CM | POA: Diagnosis not present

## 2021-03-03 DIAGNOSIS — N281 Cyst of kidney, acquired: Secondary | ICD-10-CM | POA: Diagnosis not present

## 2021-03-03 DIAGNOSIS — M545 Low back pain, unspecified: Secondary | ICD-10-CM | POA: Diagnosis not present

## 2021-03-03 DIAGNOSIS — W01190A Fall on same level from slipping, tripping and stumbling with subsequent striking against furniture, initial encounter: Secondary | ICD-10-CM | POA: Diagnosis not present

## 2021-03-03 DIAGNOSIS — S20221A Contusion of right back wall of thorax, initial encounter: Secondary | ICD-10-CM | POA: Diagnosis not present

## 2021-03-03 DIAGNOSIS — W19XXXA Unspecified fall, initial encounter: Secondary | ICD-10-CM

## 2021-03-03 DIAGNOSIS — J8410 Pulmonary fibrosis, unspecified: Secondary | ICD-10-CM | POA: Diagnosis not present

## 2021-03-03 DIAGNOSIS — S2020XA Contusion of thorax, unspecified, initial encounter: Secondary | ICD-10-CM | POA: Diagnosis not present

## 2021-03-03 LAB — CBC WITH DIFFERENTIAL/PLATELET
Abs Immature Granulocytes: 0.04 10*3/uL (ref 0.00–0.07)
Basophils Absolute: 0.1 10*3/uL (ref 0.0–0.1)
Basophils Relative: 1 %
Eosinophils Absolute: 0.2 10*3/uL (ref 0.0–0.5)
Eosinophils Relative: 2 %
HCT: 38.3 % — ABNORMAL LOW (ref 39.0–52.0)
Hemoglobin: 12 g/dL — ABNORMAL LOW (ref 13.0–17.0)
Immature Granulocytes: 0 %
Lymphocytes Relative: 13 %
Lymphs Abs: 1.3 10*3/uL (ref 0.7–4.0)
MCH: 29.3 pg (ref 26.0–34.0)
MCHC: 31.3 g/dL (ref 30.0–36.0)
MCV: 93.6 fL (ref 80.0–100.0)
Monocytes Absolute: 0.6 10*3/uL (ref 0.1–1.0)
Monocytes Relative: 7 %
Neutro Abs: 7.2 10*3/uL (ref 1.7–7.7)
Neutrophils Relative %: 77 %
Platelets: 204 10*3/uL (ref 150–400)
RBC: 4.09 MIL/uL — ABNORMAL LOW (ref 4.22–5.81)
RDW: 15.6 % — ABNORMAL HIGH (ref 11.5–15.5)
WBC: 9.4 10*3/uL (ref 4.0–10.5)
nRBC: 0 % (ref 0.0–0.2)

## 2021-03-03 LAB — COMPREHENSIVE METABOLIC PANEL
ALT: 14 U/L (ref 0–44)
AST: 21 U/L (ref 15–41)
Albumin: 3.9 g/dL (ref 3.5–5.0)
Alkaline Phosphatase: 106 U/L (ref 38–126)
Anion gap: 10 (ref 5–15)
BUN: 29 mg/dL — ABNORMAL HIGH (ref 8–23)
CO2: 26 mmol/L (ref 22–32)
Calcium: 9.1 mg/dL (ref 8.9–10.3)
Chloride: 102 mmol/L (ref 98–111)
Creatinine, Ser: 1.38 mg/dL — ABNORMAL HIGH (ref 0.61–1.24)
GFR, Estimated: 48 mL/min — ABNORMAL LOW (ref >60)
Glucose, Bld: 141 mg/dL — ABNORMAL HIGH (ref 70–99)
Potassium: 3.6 mmol/L (ref 3.5–5.1)
Sodium: 138 mmol/L (ref 135–145)
Total Bilirubin: 0.8 mg/dL (ref 0.3–1.2)
Total Protein: 7.3 g/dL (ref 6.5–8.1)

## 2021-03-03 LAB — PROTIME-INR
INR: 3.1 — ABNORMAL HIGH (ref 0.8–1.2)
Prothrombin Time: 31.5 seconds — ABNORMAL HIGH (ref 11.4–15.2)

## 2021-03-03 MED ORDER — HYDROCODONE-ACETAMINOPHEN 5-325 MG PO TABS: 1.0000 | ORAL_TABLET | ORAL | 0 refills | Status: DC | PRN

## 2021-03-03 MED ORDER — HYDROCODONE-ACETAMINOPHEN 5-325 MG PO TABS
1.0000 | ORAL_TABLET | Freq: Once | ORAL | Status: AC
  Administered 2021-03-03: 1 via ORAL
  Filled 2021-03-03: qty 1

## 2021-03-03 NOTE — Discharge Instructions (Addendum)
Take hydrocodone as prescribed as needed for pain.  Follow-up with your primary doctor in the next week, and return to the ER if you develop worsening pain or other new and concerning symptoms.

## 2021-03-03 NOTE — ED Notes (Signed)
Pt in CT at this time.

## 2021-03-03 NOTE — ED Provider Notes (Signed)
[facility] EMERGENCY DEPARTMENT Provider Note   CSN: [id number] Arrival date & time: 03/03/21  0530     History Chief Complaint  Patient presents with  . Back Pain    Douglas [name] is a 87 y.o. male.  Patient is a 87 year old male with past medical history of aortic stenosis with valve replacement, atrial fibrillation on Coumadin, complete heart block with pacemaker placement, ascending thoracic aneurysm.  Patient presenting today for evaluation of back pain.  He states that he attempted to get up to get a drink of water several nights ago.  He fell backward and struck his back on furniture.  This occurred on Sunday, then 2 days ago, his pain significantly worsened.  He denies any radiation into his legs.  He denies any bowel or bladder complaints.  The history is provided by the patient.  Back Pain Location:  Thoracic spine and lumbar spine Quality:  Aching Radiates to:  Does not radiate Pain severity:  Severe Onset quality:  Sudden Timing:  Constant Progression:  Unchanged Chronicity:  New Relieved by:  Nothing Worsened by:  Palpation, ambulation and movement      Past Medical History:  Diagnosis Date  . Acute upper GI bleed 05/18/2016  . Aneurysm of thoracic aorta (HCC)    Ascending. 5.7 x 5.5cm - Bentall procedure  . Aortic stenosis    Bioprosthetic AVR  . Arthritis   . Atrial fibrillation (HCC)    Documented on pacer interrogation 6/13  . Complete heart block (HCC)    Status post pacemaker placement  . Coronary atherosclerosis of native coronary artery    Multivessel - LIMA to LAD, SVG to OM, SVG to PDA  . DJD (degenerative joint disease)   . Esophageal dysmotility   . Essential hypertension   . Fibromyalgia   . Gout   . H/O arteriovenous malformation (AVM)   . H/O hiatal hernia   . Hemorrhoid   . History of esophageal ulcer   . History of skin cancer   . Hx of adenomatous colonic polyps   . Iron deficiency anemia due to chronic blood loss   . Melena    . Pacemaker-Medtronic   . PMR (polymyalgia rheumatica) (HCC)   . SIRS (systemic inflammatory response syndrome) (HCC) 01/16/2015    Patient Active Problem List   Diagnosis Date Noted  . Hemorrhoid 12/11/2017  . Constipation 12/11/2017  . Iron deficiency anemia due to chronic blood loss 10/03/2016  . Acute upper GI bleed 05/18/2016  . Melena   . Symptomatic anemia   . Hypotension   . History of esophageal ulcer   . H/O arteriovenous malformation (AVM)   . Chronic anticoagulation - Coumadin, CHADS2VASC=5   . UTI (urinary tract infection) 01/16/2015  . SIRS (systemic inflammatory response syndrome) (HCC) 01/16/2015  . Precordial pain 06/15/2014  . Aortic valve replaced 08/25/2012  . Pacemaker-Medtronic 07/29/2012  . Atrial fibrillation (HCC) 02/11/2012  . CHB (complete heart block) (HCC) 01/31/2012  . Aneurysm of thoracic aorta (HCC)   . Coronary atherosclerosis of native coronary artery 05/10/2011  . Essential hypertension, benign   . Aortic stenosis     Past Surgical History:  Procedure Laterality Date  . AORTIC VALVE REPLACEMENT  01/24/12  . APPENDECTOMY    . BACK SURGERY     19 65 or 1966 lumbar back surgery  . BENTALL PROCEDURE  01/24/2012   Procedure: BENTALL PROCEDURE;  Surgeon: Ivin Poot, MD;  Location: [place];  Service: Open Heart Surgery;  Laterality: N/A;  .  BILATERAL KNEE ARTHROSCOPY    . CATARACT EXTRACTION W/PHACO Left 12/03/2016   Procedure: CATARACT EXTRACTION PHACO AND INTRAOCULAR LENS PLACEMENT LEFT EYE;  Surgeon: Tonny Branch, MD;  [facility];  Service: Ophthalmology;  Laterality: Left;  CDE:  10.87  . CATARACT EXTRACTION W/PHACO Right 12/13/2016   Procedure: CATARACT EXTRACTION PHACO AND INTRAOCULAR LENS PLACEMENT (IOC);  Surgeon: Tonny Branch, MD;  Location: [facility];  Service: Ophthalmology;  Laterality: Right;  right cde 11.78  . CORONARY ARTERY BYPASS GRAFT  01/24/2012   Procedure: CORONARY ARTERY BYPASS GRAFTING (CABG);  Surgeon: Ivin Poot, MD;   Location: [place];  Service: Open Heart Surgery;  Laterality: N/A;  . ESOPHAGOGASTRODUODENOSCOPY  06/17/2012   Procedure: ESOPHAGOGASTRODUODENOSCOPY (EGD);  Surgeon: Rogene Houston, MD;  [facility];  Service: Endoscopy;  Laterality: N/A;  ED  . ESOPHAGOGASTRODUODENOSCOPY N/A 05/19/2016   Procedure: ESOPHAGOGASTRODUODENOSCOPY (EGD);  Surgeon: Danie Binder, MD;  [facility];  Service: Endoscopy;  Laterality: N/A;  . HEMORROIDECTOMY    . HERNIA REPAIR    . Left inguinal hernia repair    . PACEMAKER PLACEMENT     Medtronic 4/13 - Dr. Rayann Heman  . PERMANENT PACEMAKER INSERTION N/A 02/01/2012   Procedure: PERMANENT PACEMAKER INSERTION;  Surgeon: Thompson Grayer, MD; [place] 1 (serial number [id number])         Family History  Problem Relation Age of Onset  . Kidney disease Mother   . Heart disease Father   . Anesthesia problems Neg Hx   . Hypotension Neg Hx   . Malignant hyperthermia Neg Hx   . Pseudochol deficiency Neg Hx     Social History   Tobacco Use  . Smoking status: Never Smoker  . Smokeless tobacco: Never Used  Vaping Use  . Vaping Use: Never used  Substance Use Topics  . Alcohol use: No    Alcohol/week: 0.0 standard drinks  . Drug use: No    Home Medications Prior to Admission medications   Medication Sig Start Date End Date Taking? Authorizing Provider  acetaminophen (TYLENOL) 500 MG tablet Take 1,000 mg by mouth every 6 (six) hours as needed for mild pain.    [provider]  allopurinol (ZYLOPRIM) 300 MG tablet Take 300 mg by mouth daily. Take 1  daily    [provider]  amLODipine (NORVASC) 2.5 MG tablet Take 2.5 mg by mouth daily. 02/23/20   [provider]  amoxicillin (AMOXIL) 500 MG capsule amoxicillin 500 mg capsule    [provider]  furosemide (LASIX) 40 MG tablet Take 20 mg by mouth daily.    [provider]  metoprolol succinate (TOPROL-XL) 25 MG 24 hr tablet TAKE (2)  TABLETS BY MOUTH DAILY. 12/26/20   Satira Sark, MD  nitroGLYCERIN (NITROSTAT) 0.4 MG SL tablet Place 1 tablet (0.4 mg total) under the tongue every 5 (five) minutes as needed for chest pain. 06/24/20 09/22/20  Satira Sark, MD  pantoprazole (PROTONIX) 40 MG tablet TAKE ONE TABLET BY MOUTH ONCE DAILY IN THE MORNING. 11/24/15   Satira Sark, MD  pravastatin (PRAVACHOL) 20 MG tablet Take 20 mg by mouth every evening.  10/27/15   [provider]  sennosides-docusate sodium (SENOKOT-S) 8.6-50 MG tablet Take 1 tablet by mouth 2 (two) times daily. 12/30/17   Rogene Houston, MD  warfarin (COUMADIN) 5 MG tablet TAKE 1 TABLET BY MOUTH ONCE DAILY OR AS DIRECTED. 09/26/20   Satira Sark,  MD    Allergies    Imdur [isosorbide nitrate]  Review of Systems   Review of Systems  Musculoskeletal: Positive for back pain.  All other systems reviewed and are negative.   Physical Exam Updated Vital Signs BP (!) 163/73   Pulse 68   Temp 97.9 F (36.6 C)   Resp 18   Ht 5\' 6"  (1.676 m)   Wt 95.1 kg   SpO2 100%   BMI 33.84 kg/m   Physical Exam Vitals and nursing note reviewed.  Constitutional:      General: He is not in acute distress.    Appearance: He is well-developed. He is not diaphoretic.  HENT:     Head: Normocephalic and atraumatic.  Cardiovascular:     Rate and Rhythm: Normal rate and regular rhythm.     Heart sounds: No murmur heard. No friction rub.  Pulmonary:     Effort: Pulmonary effort is normal. No respiratory distress.     Breath sounds: Normal breath sounds. No wheezing or rales.  Abdominal:     General: Bowel sounds are normal. There is no distension.     Palpations: Abdomen is soft.     Tenderness: There is no abdominal tenderness.  Musculoskeletal:        General: Normal range of motion.     Cervical back: Normal range of motion and neck supple.     Comments: There is a hematoma noted to the right of the spine in the mid thoracic region extending  down the right flank.  There is exquisite tenderness in these areas.  There is no midline bony tenderness or step-off.  Skin:    General: Skin is warm and dry.  Neurological:     Mental Status: He is alert and oriented to person, place, and time.     Coordination: Coordination normal.     Comments: Strength is 5 out of 5 in both lower extremities and motor and sensation are intact throughout both legs.     ED Results / Procedures / Treatments   Labs (all labs ordered are listed, but only abnormal results are displayed) Labs Reviewed - No data to display  EKG None  Radiology No results found.  Procedures Procedures   Medications Ordered in ED Medications - No data to display  ED Course  I have reviewed the triage vital signs and the nursing notes.  Pertinent labs & imaging results that were available during my care of the patient were reviewed by me and considered in my medical decision making (see chart for details).    MDM Rules/Calculators/A&P  Patient presenting here complaining of back pain after a fall on Coumadin.  He fell backwards several days ago onto a piece of furniture, but the most significant pain began 2 days ago.  He does have a large hematoma noted to his flank, however no retroperitoneal hematoma or injury of abdominal injury is noted on CAT scan.  There is no evidence for fracture within the thoracic or lumbar spine.  At this point, discharge seems appropriate with pain medication.  Patient was given hydrocodone here with good results and is now resting much more comfortably.  He will be given a prescription for this and advised to follow-up with his primary doctor.  INR today is 3.1.  I will have him continue his current Coumadin dose.  Final Clinical Impression(s) / ED Diagnoses Final diagnoses:  None    Rx / DC Orders ED Discharge Orders    None  Veryl Speak, MD 03/03/21 [phone]

## 2021-03-03 NOTE — ED Triage Notes (Signed)
Pt c/o mid back pain after falling Sunday. Pt states the pain did not start until 1600 yesterday. Pt has bruising and scrapes to back

## 2021-03-09 DIAGNOSIS — I10 Essential (primary) hypertension: Secondary | ICD-10-CM | POA: Diagnosis not present

## 2021-03-09 DIAGNOSIS — I712 Thoracic aortic aneurysm, without rupture: Secondary | ICD-10-CM | POA: Diagnosis not present

## 2021-03-09 DIAGNOSIS — I480 Paroxysmal atrial fibrillation: Secondary | ICD-10-CM | POA: Diagnosis not present

## 2021-03-09 DIAGNOSIS — Z79899 Other long term (current) drug therapy: Secondary | ICD-10-CM | POA: Diagnosis not present

## 2021-03-15 NOTE — Progress Notes (Signed)
Remote pacemaker transmission.   

## 2021-03-16 DIAGNOSIS — I712 Thoracic aortic aneurysm, without rupture: Secondary | ICD-10-CM | POA: Diagnosis not present

## 2021-03-16 DIAGNOSIS — I700 Atherosclerosis of aorta: Secondary | ICD-10-CM | POA: Diagnosis not present

## 2021-03-16 DIAGNOSIS — E785 Hyperlipidemia, unspecified: Secondary | ICD-10-CM | POA: Diagnosis not present

## 2021-03-16 DIAGNOSIS — I5032 Chronic diastolic (congestive) heart failure: Secondary | ICD-10-CM | POA: Diagnosis not present

## 2021-03-16 DIAGNOSIS — I482 Chronic atrial fibrillation, unspecified: Secondary | ICD-10-CM | POA: Diagnosis not present

## 2021-03-16 DIAGNOSIS — N189 Chronic kidney disease, unspecified: Secondary | ICD-10-CM | POA: Diagnosis not present

## 2021-03-16 DIAGNOSIS — Z6830 Body mass index (BMI) 30.0-30.9, adult: Secondary | ICD-10-CM | POA: Diagnosis not present

## 2021-03-21 ENCOUNTER — Other Ambulatory Visit: Payer: Self-pay

## 2021-03-21 ENCOUNTER — Ambulatory Visit (INDEPENDENT_AMBULATORY_CARE_PROVIDER_SITE_OTHER): Payer: Medicare Other | Admitting: *Deleted

## 2021-03-21 ENCOUNTER — Other Ambulatory Visit (HOSPITAL_COMMUNITY): Payer: Self-pay | Admitting: Internal Medicine

## 2021-03-21 DIAGNOSIS — R319 Hematuria, unspecified: Secondary | ICD-10-CM

## 2021-03-21 DIAGNOSIS — I4891 Unspecified atrial fibrillation: Secondary | ICD-10-CM

## 2021-03-21 DIAGNOSIS — Z952 Presence of prosthetic heart valve: Secondary | ICD-10-CM | POA: Diagnosis not present

## 2021-03-21 DIAGNOSIS — N289 Disorder of kidney and ureter, unspecified: Secondary | ICD-10-CM

## 2021-03-21 LAB — POCT INR: INR: 2.7 (ref 2.0–3.0)

## 2021-03-21 NOTE — Patient Instructions (Signed)
Continue warfarin 1 tablet daily Continue greens Recheck in 6 weeks 

## 2021-03-23 NOTE — Progress Notes (Signed)
Cardiology Office Note  Date: 03/24/2021   ID: Douglas, [name] 02-23-1928, MRN [id number]  PCP:  [name], MD  Cardiologist:  Rozann Lesches, MD Electrophysiologist:  None   Chief Complaint: Follow-up   History of Present Illness: Douglas [name] is a 87 y.o. male with a history of CAD, aortic stenosis, atrial fibrillation, complete heart block/pacemaker, chest pain.  Patient was last seen by Dr. Domenic Polite on 12/28/2020 for follow-up visit.  He reported no palpitations or change in stamina.  He was continuing to follow with Dr. Lovena Le for Medtronic pacemaker management.  Recent device check demonstrated 56% A. fib burden with controlled ventricular rates.  He was continuing Coumadin with follow-up in anticoagulation clinic.  His CHA2DS2-VASc score was 4.  He was continuing Toprol-XL. He had no active anginal symptoms. He was continuing amlodipine, Toprol XL, Pravachol, and prn NTG SL.  He is here today for recent complaints of chest pain/chest discomfort.  States that chest pain/chest discomfort usually comes on when he is trying to work in his garden performing exertional activities.  States he recently saw his primary care provider who prescribed a medication for the chest pain.  He is not sure of the name of the medication.  He states he has not taking any sublingual nitroglycerin for the pain.  He denies any radiation or associated symptoms other than some mild shortness of breath.  Otherwise he denies any other issues.  His blood pressure is well controlled at 130/60.  He states he has a hiatal hernia and is wondering if it may be playing a role in his chest discomfort.  States that chest discomfort only occurs during exertional activities.  He denies any PND, orthopnea, palpitations or arrhythmias although he does have atrial fibrillation.  Denies any bleeding on anticoagulation.  Denies any orthostatic symptoms.  Denies any claudication-like symptoms, DVT or PE-like symptoms, or lower  extremity edema.  Denies any issues with his pacemaker.  He is status post three-vessel bypass surgery back in 2013 with aortic valve replacement.  Past Medical History:  Diagnosis Date  . Acute upper GI bleed 05/18/2016  . Aneurysm of thoracic aorta (HCC)    Ascending. 5.7 x 5.5cm - Bentall procedure  . Aortic stenosis    Bioprosthetic AVR  . Arthritis   . Atrial fibrillation ([place])    Documented on pacer interrogation 6/13  . Complete heart block ([place])    Status post pacemaker placement  . Coronary atherosclerosis of native coronary artery    Multivessel - LIMA to LAD, SVG to OM, SVG to PDA  . DJD (degenerative joint disease)   . Esophageal dysmotility   . Essential hypertension   . Fibromyalgia   . Gout   . H/O arteriovenous malformation (AVM)   . H/O hiatal hernia   . Hemorrhoid   . History of esophageal ulcer   . History of skin cancer   . Hx of adenomatous colonic polyps   . Iron deficiency anemia due to chronic blood loss   . Melena   . Pacemaker-Medtronic   . PMR (polymyalgia rheumatica) (HCC)   . SIRS (systemic inflammatory response syndrome) ([place]) 01/16/2015    Past Surgical History:  Procedure Laterality Date  . AORTIC VALVE REPLACEMENT  01/24/12  . APPENDECTOMY    . [place] or 1966 lumbar back surgery  . BENTALL PROCEDURE  01/24/2012   Procedure: BENTALL PROCEDURE;  Surgeon: Ivin Poot, MD;  Location: [facility];  Service: Open Heart Surgery;  Laterality: N/A;  . BILATERAL KNEE ARTHROSCOPY    . CATARACT EXTRACTION W/PHACO Left 12/03/2016   Procedure: CATARACT EXTRACTION PHACO AND INTRAOCULAR LENS PLACEMENT LEFT EYE;  Surgeon: Tonny Branch, MD;  [facility];  Service: Ophthalmology;  Laterality: Left;  CDE:  10.87  . CATARACT EXTRACTION W/PHACO Right 12/13/2016   Procedure: CATARACT EXTRACTION PHACO AND INTRAOCULAR LENS PLACEMENT (IOC);  Surgeon: Tonny Branch, MD;  Location: [facility];  Service: Ophthalmology;  Laterality: Right;  right cde 11.78  .  CORONARY ARTERY BYPASS GRAFT  01/24/2012   Procedure: CORONARY ARTERY BYPASS GRAFTING (CABG);  Surgeon: Ivin Poot, MD;  Location: [facility];  Service: Open Heart Surgery;  Laterality: N/A;  . ESOPHAGOGASTRODUODENOSCOPY  06/17/2012   Procedure: ESOPHAGOGASTRODUODENOSCOPY (EGD);  Surgeon: Rogene Houston, MD;  [facility];  Service: Endoscopy;  Laterality: N/A;  ED  . ESOPHAGOGASTRODUODENOSCOPY N/A 05/19/2016   Procedure: ESOPHAGOGASTRODUODENOSCOPY (EGD);  Surgeon: Danie Binder, MD;  [facility];  Service: Endoscopy;  Laterality: N/A;  . HEMORROIDECTOMY    . HERNIA REPAIR    . Left inguinal hernia repair    . PACEMAKER PLACEMENT     Medtronic 4/13 - Dr. Rayann Heman  . PERMANENT PACEMAKER INSERTION N/A 02/01/2012   Procedure: PERMANENT PACEMAKER INSERTION;  Surgeon: Thompson Grayer, MD; [place] 1 (serial number [id number])      Current Outpatient Medications  Medication Sig Dispense Refill  . acetaminophen (TYLENOL) 500 MG tablet Take 1,000 mg by mouth every 6 (six) hours as needed for mild pain.    Marland Kitchen allopurinol (ZYLOPRIM) 300 MG tablet Take 300 mg by mouth daily. Take 1  daily    . amLODipine (NORVASC) 2.5 MG tablet Take 2.5 mg by mouth daily.    Marland Kitchen amoxicillin (AMOXIL) 500 MG capsule amoxicillin 500 mg capsule    . furosemide (LASIX) 40 MG tablet Take 20 mg by mouth daily.    . metoprolol succinate (TOPROL-XL) 25 MG 24 hr tablet TAKE (2) TABLETS BY MOUTH DAILY. 180 tablet 3  . nitroGLYCERIN (NITROSTAT) 0.4 MG SL tablet Place 1 tablet (0.4 mg total) under the tongue every 5 (five) minutes as needed for chest pain. 30 tablet 3  . pantoprazole (PROTONIX) 40 MG tablet TAKE ONE TABLET BY MOUTH ONCE DAILY IN THE MORNING. 30 tablet 6  . pravastatin (PRAVACHOL) 20 MG tablet Take 20 mg by mouth every evening.   11  . sennosides-docusate sodium (SENOKOT-S) 8.6-50 MG tablet Take 1 tablet by mouth 2 (two) times daily.    Marland Kitchen warfarin (COUMADIN) 5 MG tablet TAKE 1  TABLET BY MOUTH ONCE DAILY OR AS DIRECTED. 90 tablet 4   No current facility-administered medications for this visit.   Allergies:  Imdur [isosorbide nitrate]   Social History: The patient  reports that he has never smoked. He has never used smokeless tobacco. He reports that he does not drink alcohol and does not use drugs.   Family History: The patient's family history includes Heart disease in his father; Kidney disease in his mother.   ROS:  Please see the history of present illness. Otherwise, complete review of systems is positive for none.  All other systems are reviewed and negative.   Physical Exam: VS:  BP 130/60   Pulse 69   Ht 5\' 5"  (1.651 m)   Wt 205 lb (93 kg)   SpO2 96%   BMI 34.11 kg/m , BMI Body mass index is 34.[place]  kg/m.  Wt Readings from Last 3 Encounters:  03/24/21 205 lb (93 kg)  03/03/21 209 lb 10.5 oz (95.1 kg)  12/28/20 209 lb 9.6 oz (95.1 kg)    General: Patient appears comfortable at rest. Neck: Supple, no elevated JVP or carotid bruits, no thyromegaly. Lungs: Clear to auscultation, nonlabored breathing at rest. Cardiac: Regular rate and rhythm, no S3 or significant systolic murmur, no pericardial rub. Extremities: No pitting edema, distal pulses 2+. Skin: Warm and dry. Musculoskeletal: No kyphosis. Neuropsychiatric: Alert and oriented x3, affect grossly appropriate.  ECG:  An ECG dated 03/24/2021 was personally reviewed today and demonstrated:  Electronic ventricular pacemaker rate of 71  Recent Labwork: 03/03/2021: ALT 14; AST 21; BUN 29; Creatinine, Ser 1.38; Hemoglobin 12.0; Platelets 204; Potassium 3.6; Sodium 138  No results found for: CHOL, TRIG, HDL, CHOLHDL, VLDL, LDLCALC, LDLDIRECT  Other Studies Reviewed Today:  Lexiscan Myoview 09/05/2018:  Defect 1: There is a medium defect of mild severity present in the mid inferior, mid inferolateral and apical inferior location.  Findings consistent with a mild to moderate degree of  ischemia.  This is a high risk study based primarily on severely reduced LVEF.  Nuclear stress EF: 16%.  ECG showed AV pacing throughout study.  Echocardiogram 09/22/2018: - Left ventricle: The cavity size was normal. Wall thickness was  increased in a pattern of mild LVH. Systolic function was normal.  The estimated ejection fraction was in the range of 55% to 60%.  There is mild hypokinesis of the apical anteroseptal myocardium,  possibly related to RV pacing. Indeterminate diastolic function.  - Aortic valve: History of Bental procedure with 23 mm Edwards  pericardial valve along with 27mm Gelweave Valsalva ascending  aorta graft. Transvalvular velocity is in normal range.  - Mitral valve: Mildly calcified annulus. There was trivial  regurgitation.  - Left atrium: The atrium was mildly to moderately dilated.  - Right ventricle: Pacer wire or catheter noted in right ventricle.  - Right atrium: The atrium was at the upper limits of normal in  size.  - Atrial septum: No defect or patent foramen ovale was identified.  - Tricuspid valve: There was trivial regurgitation.  - Pulmonary arteries: PA peak pressure: 23 mm Hg (S).  - Pericardium, extracardiac: A prominent pericardial fat pad was  present. Assessment and Plan:  1. Persistent atrial fibrillation ([place])   2. CHB (complete heart block) (HCC)   3. CAD in native artery    1. Persistent atrial fibrillation (HCC) Denies any palpitations or arrhythmias.  Has an indwelling placed maker which shows ventricular paced rhythm at 71.  Continue Toprol-XL 50 mg daily.  Continue Coumadin 5 mg daily.  2. CHB (complete heart block) (HCC) Has an indwelling pacemaker with ventricular paced rhythm today at rate of 71.  Continue to follow with Dr. Lovena Le for pacemaker management.  3. CAD in native artery/chest pain Complaining of some chest pain/chest discomfort without radiation or associated symptoms other than mild mild  shortness of breath.  He states his PCP recently prescribed a medication which is not on his current medication list which sounds as if it may be Imdur.  We are going to contact [facility].  If Imdur is not prescribed we will start him on Imdur 15 mg daily and may titrate up to 30 for chest pain.  If persistent chest pain in spite of nitrate therapy may order a stress test.  He has a history of three-vessel bypass in 2013 with aortic valve replacement.  Medication Adjustments/Labs and Tests Ordered: Current medicines are reviewed at length with the patient today.  Concerns regarding medicines are outlined above.   Disposition: Follow-up with Dr. Domenic Polite or APP 6 months  Signed, Levell July, NP 03/24/2021 10:48 AM    [facility], [place], [place] [address] Phone: [phone]; Fax: [phone]

## 2021-03-24 ENCOUNTER — Encounter: Payer: Self-pay | Admitting: Family Medicine

## 2021-03-24 ENCOUNTER — Other Ambulatory Visit: Payer: Self-pay

## 2021-03-24 ENCOUNTER — Ambulatory Visit (INDEPENDENT_AMBULATORY_CARE_PROVIDER_SITE_OTHER): Payer: Medicare Other | Admitting: Family Medicine

## 2021-03-24 VITALS — BP 130/60 | HR 69 | Ht 65.0 in | Wt 205.0 lb

## 2021-03-24 DIAGNOSIS — I4819 Other persistent atrial fibrillation: Secondary | ICD-10-CM | POA: Diagnosis not present

## 2021-03-24 DIAGNOSIS — I442 Atrioventricular block, complete: Secondary | ICD-10-CM

## 2021-03-24 DIAGNOSIS — I2510 Atherosclerotic heart disease of native coronary artery without angina pectoris: Secondary | ICD-10-CM

## 2021-03-24 NOTE — Patient Instructions (Addendum)
Medication Instructions:   Your physician recommends that you continue on your current medications as directed. Please refer to the Current Medication list given to you today.  Labwork:  none  Testing/Procedures:  none  Follow-Up:  Your physician recommends that you schedule a follow-up appointment in: 6 months.  Any Other Special Instructions Will Be Listed Below (If Applicable).   Call office if you continue to have chest pain  If you need a refill on your cardiac medications before your next appointment, please call your pharmacy.

## 2021-03-28 ENCOUNTER — Other Ambulatory Visit: Payer: Self-pay

## 2021-03-28 ENCOUNTER — Ambulatory Visit (HOSPITAL_COMMUNITY)
Admission: RE | Admit: 2021-03-28 | Discharge: 2021-03-28 | Disposition: A | Discharge status: Home / Self Care | Payer: Medicare Other | Source: Ambulatory Visit | Attending: Internal Medicine | Admitting: Internal Medicine

## 2021-03-28 DIAGNOSIS — R319 Hematuria, unspecified: Secondary | ICD-10-CM

## 2021-03-28 DIAGNOSIS — N289 Disorder of kidney and ureter, unspecified: Secondary | ICD-10-CM

## 2021-03-28 DIAGNOSIS — N281 Cyst of kidney, acquired: Secondary | ICD-10-CM | POA: Diagnosis not present

## 2021-04-06 ENCOUNTER — Other Ambulatory Visit: Payer: Self-pay

## 2021-04-06 ENCOUNTER — Encounter (INDEPENDENT_AMBULATORY_CARE_PROVIDER_SITE_OTHER): Payer: Self-pay | Admitting: Gastroenterology

## 2021-04-06 ENCOUNTER — Ambulatory Visit (INDEPENDENT_AMBULATORY_CARE_PROVIDER_SITE_OTHER): Payer: Medicare Other | Admitting: Gastroenterology

## 2021-04-06 VITALS — BP 134/77 | HR 71 | Temp 99.0°F | Ht 65.0 in | Wt 203.0 lb

## 2021-04-06 DIAGNOSIS — K5900 Constipation, unspecified: Secondary | ICD-10-CM

## 2021-04-06 MED ORDER — LINACLOTIDE 145 MCG PO CAPS: 145.0000 ug | ORAL_CAPSULE | Freq: Every day | ORAL | 3 refills | Status: DC

## 2021-04-06 MED ORDER — PEG 3350-KCL-NA BICARB-NACL 420 G PO SOLR: 4000.0000 mL | Freq: Once | ORAL | 0 refills | Status: AC

## 2021-04-06 NOTE — Patient Instructions (Signed)
Take a colonoscopy bowel prep slowly for two days After finishing the bowel prep, start Linzess pills 145 mcg every day (samples provided) If more than 4 bowel movements per day, stop the Sennakot If still more than 4 bowel movements per day, take Linzess every other day

## 2021-04-06 NOTE — Progress Notes (Signed)
Maylon Peppers, M.D. Gastroenterology & Hepatology [facility] [address] [place], [place] [address]  Primary Care Physician: [name], MD [address] [place] [address]  I will communicate my assessment and recommendations to the referring MD via EMR.  Problems: Severe constipation History of anal stenosis  History of Present Illness: Douglas [name] is a 87 y.o. male PMH aortic stenosis and thoracic aortic aneurysm s/p Bental surgery, atrial fibrillation, coronary artery disease status post CABG, fibromyalgia, hypertension, gout, PMR, who presents for follow up of constipation.  The patient was last seen on 12/30/2017. At that time, the patient was advised to continue taking Colace twice a day and to increase the intake of fiber rich foods.  Patient reports that he is still having significant constipation. States that the last few years it was better but for the last month he has presented worsening constipation. He states he has had a BM every 4th day so he has to take a "pink pill laxative" (possibly Dulcolax) to move his bowels. He takes Sennakot BID as well. The patient states he has to strain significantly to move his bowels sometimes, which "pops my hemorrhoids out and it hurts slightly" but the rectal pain goes away.  He also reports some episodes of chest pain diffusely "which usually happens during the spring time". States the last time he had pain was a week ago. Due to this he was prescribed isosorbide by his cardiologist which has helped relieve his symptoms.  The patient denies having any nausea, vomiting, fever, chills, hematochezia, melena, hematemesis, abdominal distention, abdominal pain, diarrhea, jaundice, pruritus or weight loss.  Past Medical History: Past Medical History:  Diagnosis Date   Acute upper GI bleed 05/18/2016   Aneurysm of thoracic aorta (HCC)    Ascending. 5.7 x 5.5cm - Bentall  procedure   Aortic stenosis    Bioprosthetic AVR   Arthritis    Atrial fibrillation ([place])    Documented on pacer interrogation 6/13   Complete heart block (HCC)    Status post pacemaker placement   Coronary atherosclerosis of native coronary artery    Multivessel - LIMA to LAD, SVG to OM, SVG to PDA   DJD (degenerative joint disease)    Esophageal dysmotility    Essential hypertension    Fibromyalgia    Gout    H/O arteriovenous malformation (AVM)    H/O hiatal hernia    Hemorrhoid    History of esophageal ulcer    History of skin cancer    Hx of adenomatous colonic polyps    Iron deficiency anemia due to chronic blood loss    Melena    Pacemaker-Medtronic    PMR (polymyalgia rheumatica) (HCC)    SIRS (systemic inflammatory response syndrome) ([place]) 01/16/2015    Past Surgical History: Past Surgical History:  Procedure Laterality Date   AORTIC VALVE REPLACEMENT  01/24/12   APPENDECTOMY     BACK SURGERY     1965 or 1966 lumbar back surgery   BENTALL PROCEDURE  01/24/2012   Procedure: BENTALL PROCEDURE;  Surgeon: Ivin Poot, MD;  Location: [facility];  Service: Open Heart Surgery;  Laterality: N/A;   BILATERAL KNEE ARTHROSCOPY     CATARACT EXTRACTION W/PHACO Left 12/03/2016   Procedure: CATARACT EXTRACTION PHACO AND INTRAOCULAR LENS PLACEMENT LEFT EYE;  Surgeon: Tonny Branch, MD;  [facility];  Service: Ophthalmology;  Laterality: Left;  CDE:  10.87   CATARACT EXTRACTION W/PHACO Right 12/13/2016  Procedure: CATARACT EXTRACTION PHACO AND INTRAOCULAR LENS PLACEMENT (IOC);  Surgeon: Tonny Branch, MD;  [facility];  Service: Ophthalmology;  Laterality: Right;  right cde 11.78   CORONARY ARTERY BYPASS GRAFT  01/24/2012   Procedure: CORONARY ARTERY BYPASS GRAFTING (CABG);  Surgeon: Ivin Poot, MD;  Location: [facility];  Service: Open Heart Surgery;  Laterality: N/A;   ESOPHAGOGASTRODUODENOSCOPY  06/17/2012   Procedure: ESOPHAGOGASTRODUODENOSCOPY (EGD);  Surgeon: Rogene Houston,  MD;  [facility];  Service: Endoscopy;  Laterality: N/A;  ED   ESOPHAGOGASTRODUODENOSCOPY N/A 05/19/2016   Procedure: ESOPHAGOGASTRODUODENOSCOPY (EGD);  Surgeon: Danie Binder, MD;  [facility];  Service: Endoscopy;  Laterality: N/A;   HEMORROIDECTOMY     HERNIA REPAIR     Left inguinal hernia repair     PACEMAKER PLACEMENT     Medtronic 4/13 - Dr. Rayann Heman   PERMANENT PACEMAKER INSERTION N/A 02/01/2012   Procedure: PERMANENT PACEMAKER INSERTION;  Surgeon: Thompson Grayer, MD; Medtronic Adapta L model ADDRL 1 (serial number [id number])      Family History: Family History  Problem Relation Age of Onset   Kidney disease Mother    Heart disease Father    Anesthesia problems Neg Hx    Hypotension Neg Hx    Malignant hyperthermia Neg Hx    Pseudochol deficiency Neg Hx     Social History: Social History   Tobacco Use  Smoking Status Never  Smokeless Tobacco Never   Social History   Substance and Sexual Activity  Alcohol Use No   Alcohol/week: 0.0 standard drinks   Social History   Substance and Sexual Activity  Drug Use No    Allergies: Allergies  Allergen Reactions   Imdur [Isosorbide Nitrate] Other (See Comments)    Nausea and lightheaded     Medications: Current Outpatient Medications  Medication Sig Dispense Refill   acetaminophen (TYLENOL) 500 MG tablet Take 1,000 mg by mouth every 6 (six) hours as needed for mild pain.     allopurinol (ZYLOPRIM) 300 MG tablet Take 300 mg by mouth daily. Take 1  daily     amLODipine (NORVASC) 2.5 MG tablet Take 2.5 mg by mouth daily.     amoxicillin (AMOXIL) 500 MG capsule As need for dental work.     furosemide (LASIX) 40 MG tablet Take 20 mg by mouth daily.     isosorbide mononitrate (IMDUR) 30 MG 24 hr tablet Take 15 mg by mouth daily.     linaclotide (LINZESS) 145 MCG CAPS capsule Take 1 capsule (145 mcg total) by mouth daily before breakfast. 90 capsule 3   metoprolol succinate (TOPROL-XL) 25 MG 24 hr  tablet TAKE (2) TABLETS BY MOUTH DAILY. 180 tablet 3   nitroGLYCERIN (NITROSTAT) 0.4 MG SL tablet Place 0.4 mg under the tongue every 5 (five) minutes x 3 doses as needed for chest pain (if no relief after 2nd dose, proceed to the ED for an evaluation or call 911).     pantoprazole (PROTONIX) 40 MG tablet TAKE ONE TABLET BY MOUTH ONCE DAILY IN THE MORNING. 30 tablet 6   polyethylene glycol-electrolytes (NULYTELY) 420 g solution Take 4,000 mLs by mouth once for 1 dose. 4000 mL 0   pravastatin (PRAVACHOL) 20 MG tablet Take 20 mg by mouth every evening.   11   sennosides-docusate sodium (SENOKOT-S) 8.6-50 MG tablet Take 1 tablet by mouth 2 (two) times daily.     warfarin (COUMADIN) 5 MG tablet TAKE 1 TABLET BY MOUTH ONCE  DAILY OR AS DIRECTED. 90 tablet 4   No current facility-administered medications for this visit.    Review of Systems: GENERAL: negative for malaise, night sweats HEENT: No changes in hearing or vision, no nose bleeds or other nasal problems. NECK: Negative for lumps, goiter, pain and significant neck swelling RESPIRATORY: Negative for cough, wheezing CARDIOVASCULAR: Negative for chest pain, leg swelling, palpitations, orthopnea GI: SEE HPI MUSCULOSKELETAL: Negative for joint pain or swelling, back pain, and muscle pain. SKIN: Negative for lesions, rash PSYCH: Negative for sleep disturbance, mood disorder and recent psychosocial stressors. HEMATOLOGY Negative for prolonged bleeding, bruising easily, and swollen nodes. ENDOCRINE: Negative for cold or heat intolerance, polyuria, polydipsia and goiter. NEURO: negative for tremor, gait imbalance, syncope and seizures. The remainder of the review of systems is noncontributory.   Physical Exam: BP 134/77 (BP Location: Right Arm, Patient Position: Sitting, Cuff Size: Large)   Pulse 71   Temp 99 F (37.2 C) (Oral)   Ht 5\' 5"  (1.651 m)   Wt 203 lb (92.1 kg)   BMI 33.78 kg/m  GENERAL: The patient is AO x3, in no acute  distress. Elder. HEENT: Head is normocephalic and atraumatic. EOMI are intact. Mouth is well hydrated and without lesions. NECK: Supple. No masses LUNGS: Clear to auscultation. No presence of rhonchi/wheezing/rales. Adequate chest expansion HEART: RRR, normal s1 and s2. ABDOMEN: Soft, nontender, no guarding, no peritoneal signs, and nondistended. BS +. No masses. EXTREMITIES: Without any cyanosis, clubbing, rash, lesions or edema. NEUROLOGIC: AOx3, no focal motor deficit. SKIN: no jaundice, no rashes  Imaging/Labs: as above  I personally reviewed and interpreted the available labs, imaging and endoscopic files.  Impression and Plan: Douglas [name] is a 87 y.o. male PMH aortic stenosis and thoracic aortic aneurysm s/p Bental surgery, atrial fibrillation, coronary artery disease status post CABG, fibromyalgia, hypertension, gout, PMR, who presents for follow up of constipation.  The patient has not improved with the use of Colace and in fact has not tolerated adequately the use of MiraLAX.  Due to this, we will need to attempt using other laxatives that have a stronger effect such as Linzess 145 mcg/day, samples will be given to the patient today.  I advised the patient to take a complete bowel prep during 2 days before starting Linzess.  He understood and agreed.  -Take a colonoscopy bowel prep slowly for two days - After finishing the bowel prep, start Linzess pills 145 mcg every day (samples provided). If more than 4 bowel movements per day, stop the Sennakot. If still more than 4 bowel movements per day, take Linzess every other day  All questions were answered.      Harvel Quale, MD Gastroenterology and Hepatology [facility]

## 2021-04-28 ENCOUNTER — Ambulatory Visit (INDEPENDENT_AMBULATORY_CARE_PROVIDER_SITE_OTHER): Payer: Medicare Other | Admitting: *Deleted

## 2021-04-28 ENCOUNTER — Other Ambulatory Visit: Payer: Self-pay

## 2021-04-28 DIAGNOSIS — I4891 Unspecified atrial fibrillation: Secondary | ICD-10-CM | POA: Diagnosis not present

## 2021-04-28 DIAGNOSIS — Z952 Presence of prosthetic heart valve: Secondary | ICD-10-CM | POA: Diagnosis not present

## 2021-04-28 DIAGNOSIS — I480 Paroxysmal atrial fibrillation: Secondary | ICD-10-CM | POA: Diagnosis not present

## 2021-04-28 DIAGNOSIS — Z5181 Encounter for therapeutic drug level monitoring: Secondary | ICD-10-CM | POA: Diagnosis not present

## 2021-04-28 LAB — POCT INR: INR: 2 (ref 2.0–3.0)

## 2021-04-28 NOTE — Patient Instructions (Signed)
Continue warfarin 1 tablet daily Continue greens Recheck in 6 weeks 

## 2021-05-19 DIAGNOSIS — Z23 Encounter for immunization: Secondary | ICD-10-CM | POA: Diagnosis not present

## 2021-05-21 DIAGNOSIS — I10 Essential (primary) hypertension: Secondary | ICD-10-CM | POA: Diagnosis not present

## 2021-05-21 DIAGNOSIS — I2510 Atherosclerotic heart disease of native coronary artery without angina pectoris: Secondary | ICD-10-CM | POA: Diagnosis not present

## 2021-05-23 ENCOUNTER — Ambulatory Visit (INDEPENDENT_AMBULATORY_CARE_PROVIDER_SITE_OTHER): Payer: Medicare Other

## 2021-05-23 DIAGNOSIS — I442 Atrioventricular block, complete: Secondary | ICD-10-CM | POA: Diagnosis not present

## 2021-05-23 LAB — CUP PACEART REMOTE DEVICE CHECK
Battery Impedance: 1827 Ohm
Battery Remaining Longevity: 36 mo
Battery Voltage: 2.75 V
Brady Statistic AP VP Percent: 76 %
Brady Statistic AP VS Percent: 0 %
Brady Statistic AS VP Percent: 24 %
Brady Statistic AS VS Percent: 0 %
Date Time Interrogation Session: 20220802112736
Implantable Lead Implant Date: 20130412
Implantable Lead Implant Date: 20130412
Implantable Lead Location: 753859
Implantable Lead Location: 753860
Implantable Lead Model: 5076
Implantable Lead Model: 5076
Implantable Pulse Generator Implant Date: 20130412
Lead Channel Impedance Value: 446 Ohm
Lead Channel Impedance Value: 535 Ohm
Lead Channel Pacing Threshold Amplitude: 0.5 V
Lead Channel Pacing Threshold Amplitude: 0.75 V
Lead Channel Pacing Threshold Pulse Width: 0.4 ms
Lead Channel Pacing Threshold Pulse Width: 0.4 ms
Lead Channel Setting Pacing Amplitude: 1.5 V
Lead Channel Setting Pacing Amplitude: 2 V
Lead Channel Setting Pacing Pulse Width: 0.4 ms
Lead Channel Setting Sensing Sensitivity: 4 mV

## 2021-06-12 ENCOUNTER — Ambulatory Visit (INDEPENDENT_AMBULATORY_CARE_PROVIDER_SITE_OTHER): Payer: Medicare Other | Admitting: *Deleted

## 2021-06-12 DIAGNOSIS — Z5181 Encounter for therapeutic drug level monitoring: Secondary | ICD-10-CM

## 2021-06-12 DIAGNOSIS — I4891 Unspecified atrial fibrillation: Secondary | ICD-10-CM

## 2021-06-12 DIAGNOSIS — Z952 Presence of prosthetic heart valve: Secondary | ICD-10-CM | POA: Diagnosis not present

## 2021-06-12 DIAGNOSIS — I480 Paroxysmal atrial fibrillation: Secondary | ICD-10-CM | POA: Diagnosis not present

## 2021-06-12 DIAGNOSIS — M109 Gout, unspecified: Secondary | ICD-10-CM | POA: Diagnosis not present

## 2021-06-12 DIAGNOSIS — E785 Hyperlipidemia, unspecified: Secondary | ICD-10-CM | POA: Diagnosis not present

## 2021-06-12 DIAGNOSIS — I2510 Atherosclerotic heart disease of native coronary artery without angina pectoris: Secondary | ICD-10-CM | POA: Diagnosis not present

## 2021-06-12 DIAGNOSIS — I10 Essential (primary) hypertension: Secondary | ICD-10-CM | POA: Diagnosis not present

## 2021-06-12 DIAGNOSIS — D550 Anemia due to glucose-6-phosphate dehydrogenase [G6PD] deficiency: Secondary | ICD-10-CM | POA: Diagnosis not present

## 2021-06-12 DIAGNOSIS — Z79899 Other long term (current) drug therapy: Secondary | ICD-10-CM | POA: Diagnosis not present

## 2021-06-12 LAB — POCT INR: INR: 2.7 (ref 2.0–3.0)

## 2021-06-12 NOTE — Patient Instructions (Signed)
Continue warfarin 1 tablet daily Continue greens Recheck in 6 weeks 

## 2021-06-19 DIAGNOSIS — I5032 Chronic diastolic (congestive) heart failure: Secondary | ICD-10-CM | POA: Diagnosis not present

## 2021-06-19 DIAGNOSIS — D550 Anemia due to glucose-6-phosphate dehydrogenase [G6PD] deficiency: Secondary | ICD-10-CM | POA: Diagnosis not present

## 2021-06-19 DIAGNOSIS — I480 Paroxysmal atrial fibrillation: Secondary | ICD-10-CM | POA: Diagnosis not present

## 2021-06-19 NOTE — Progress Notes (Signed)
Remote pacemaker transmission.   

## 2021-06-20 ENCOUNTER — Ambulatory Visit (INDEPENDENT_AMBULATORY_CARE_PROVIDER_SITE_OTHER): Payer: Medicare Other | Admitting: Internal Medicine

## 2021-07-18 ENCOUNTER — Ambulatory Visit: Payer: Medicare Other | Admitting: Cardiology

## 2021-07-24 ENCOUNTER — Other Ambulatory Visit: Payer: Self-pay

## 2021-07-24 ENCOUNTER — Ambulatory Visit (INDEPENDENT_AMBULATORY_CARE_PROVIDER_SITE_OTHER): Payer: Medicare Other | Admitting: *Deleted

## 2021-07-24 DIAGNOSIS — Z5181 Encounter for therapeutic drug level monitoring: Secondary | ICD-10-CM | POA: Diagnosis not present

## 2021-07-24 DIAGNOSIS — Z952 Presence of prosthetic heart valve: Secondary | ICD-10-CM | POA: Diagnosis not present

## 2021-07-24 DIAGNOSIS — I4891 Unspecified atrial fibrillation: Secondary | ICD-10-CM | POA: Diagnosis not present

## 2021-07-24 LAB — POCT INR: INR: 6.4 — AB (ref 2.0–3.0)

## 2021-07-24 NOTE — Patient Instructions (Addendum)
Hold warfarin tonight and tomorrow night Eat extra greens today Recheck in 2 days Denies S/S of Bleeding.  Bleeding and fall precautions discussed and pt verbalized understanding.

## 2021-07-26 ENCOUNTER — Ambulatory Visit (INDEPENDENT_AMBULATORY_CARE_PROVIDER_SITE_OTHER): Payer: Medicare Other | Admitting: *Deleted

## 2021-07-26 DIAGNOSIS — Z952 Presence of prosthetic heart valve: Secondary | ICD-10-CM | POA: Diagnosis not present

## 2021-07-26 DIAGNOSIS — I4891 Unspecified atrial fibrillation: Secondary | ICD-10-CM | POA: Diagnosis not present

## 2021-07-26 DIAGNOSIS — Z5181 Encounter for therapeutic drug level monitoring: Secondary | ICD-10-CM | POA: Diagnosis not present

## 2021-07-26 LAB — POCT INR: INR: 2.4 (ref 2.0–3.0)

## 2021-07-26 NOTE — Patient Instructions (Signed)
Restart warfarin 5mg  daily Recheck in 2 weeks

## 2021-07-28 DIAGNOSIS — D509 Iron deficiency anemia, unspecified: Secondary | ICD-10-CM | POA: Diagnosis not present

## 2021-07-28 DIAGNOSIS — M6281 Muscle weakness (generalized): Secondary | ICD-10-CM | POA: Diagnosis not present

## 2021-07-31 DIAGNOSIS — M6281 Muscle weakness (generalized): Secondary | ICD-10-CM | POA: Diagnosis not present

## 2021-07-31 DIAGNOSIS — D509 Iron deficiency anemia, unspecified: Secondary | ICD-10-CM | POA: Diagnosis not present

## 2021-08-04 DIAGNOSIS — Z23 Encounter for immunization: Secondary | ICD-10-CM | POA: Diagnosis not present

## 2021-08-09 ENCOUNTER — Ambulatory Visit (INDEPENDENT_AMBULATORY_CARE_PROVIDER_SITE_OTHER): Payer: Medicare Other | Admitting: *Deleted

## 2021-08-09 DIAGNOSIS — Z952 Presence of prosthetic heart valve: Secondary | ICD-10-CM | POA: Diagnosis not present

## 2021-08-09 DIAGNOSIS — Z5181 Encounter for therapeutic drug level monitoring: Secondary | ICD-10-CM

## 2021-08-09 DIAGNOSIS — I4891 Unspecified atrial fibrillation: Secondary | ICD-10-CM

## 2021-08-09 LAB — POCT INR: INR: 3.4 — AB (ref 2.0–3.0)

## 2021-08-09 NOTE — Patient Instructions (Signed)
Hold warfarin tonight then decrease dose to 1 tablet daily except 1/2 tablet on Sundays Recheck in 3 weeks

## 2021-08-15 ENCOUNTER — Ambulatory Visit (INDEPENDENT_AMBULATORY_CARE_PROVIDER_SITE_OTHER): Payer: Medicare Other | Admitting: Internal Medicine

## 2021-08-15 ENCOUNTER — Other Ambulatory Visit: Payer: Self-pay

## 2021-08-15 ENCOUNTER — Encounter: Payer: Self-pay | Admitting: Internal Medicine

## 2021-08-15 VITALS — BP 116/56 | HR 70 | Ht 65.0 in | Wt 198.6 lb

## 2021-08-15 DIAGNOSIS — I4891 Unspecified atrial fibrillation: Secondary | ICD-10-CM | POA: Diagnosis not present

## 2021-08-15 DIAGNOSIS — I2510 Atherosclerotic heart disease of native coronary artery without angina pectoris: Secondary | ICD-10-CM

## 2021-08-15 DIAGNOSIS — I442 Atrioventricular block, complete: Secondary | ICD-10-CM | POA: Diagnosis not present

## 2021-08-15 NOTE — Progress Notes (Signed)
HPI Douglas [name] returns today for followup. He has a h/o Bental/AVR and CHB, s/p PPM insertion several years ago. He has remained on coumadin therapy. He denies chest pain, sob, or syncope. Very mild peripheral edema. No bleeding on coumadin. He has class 2 dyspnea. He has class 2 peripheral edema.   Allergies  Allergen Reactions   Imdur [Isosorbide Nitrate] Other (See Comments)    Nausea and lightheaded      Current Outpatient Medications  Medication Sig Dispense Refill   acetaminophen (TYLENOL) 500 MG tablet Take 1,000 mg by mouth every 6 (six) hours as needed for mild pain.     allopurinol (ZYLOPRIM) 300 MG tablet Take 300 mg by mouth daily. Take 1  daily     amLODipine (NORVASC) 2.5 MG tablet Take 2.5 mg by mouth daily.     amoxicillin (AMOXIL) 500 MG capsule As need for dental work.     furosemide (LASIX) 40 MG tablet Take 20 mg by mouth daily.     isosorbide mononitrate (IMDUR) 30 MG 24 hr tablet Take 15 mg by mouth daily.     linaclotide (LINZESS) 145 MCG CAPS capsule Take 1 capsule (145 mcg total) by mouth daily before breakfast. 90 capsule 3   metoprolol succinate (TOPROL-XL) 25 MG 24 hr tablet TAKE (2) TABLETS BY MOUTH DAILY. 180 tablet 3   nitroGLYCERIN (NITROSTAT) 0.4 MG SL tablet Place 0.4 mg under the tongue every 5 (five) minutes x 3 doses as needed for chest pain (if no relief after 2nd dose, proceed to the ED for an evaluation or call 911).     pantoprazole (PROTONIX) 40 MG tablet TAKE ONE TABLET BY MOUTH ONCE DAILY IN THE MORNING. 30 tablet 6   pravastatin (PRAVACHOL) 20 MG tablet Take 20 mg by mouth every evening.   11   sennosides-docusate sodium (SENOKOT-S) 8.6-50 MG tablet Take 1 tablet by mouth 2 (two) times daily.     warfarin (COUMADIN) 5 MG tablet TAKE 1 TABLET BY MOUTH ONCE DAILY OR AS DIRECTED. 90 tablet 4   No current facility-administered medications for this visit.     Past Medical History:  Diagnosis Date   Acute upper GI bleed 05/18/2016    Aneurysm of thoracic aorta    Ascending. 5.7 x 5.5cm - Bentall procedure   Aortic stenosis    Bioprosthetic AVR   Arthritis    Atrial fibrillation ([place])    Documented on pacer interrogation 6/13   Complete heart block (HCC)    Status post pacemaker placement   Coronary atherosclerosis of native coronary artery    Multivessel - LIMA to LAD, SVG to OM, SVG to PDA   DJD (degenerative joint disease)    Esophageal dysmotility    Essential hypertension    Fibromyalgia    Gout    H/O arteriovenous malformation (AVM)    H/O hiatal hernia    Hemorrhoid    History of esophageal ulcer    History of skin cancer    Hx of adenomatous colonic polyps    Iron deficiency anemia due to chronic blood loss    Melena    Pacemaker-Medtronic    PMR (polymyalgia rheumatica) (HCC)    SIRS (systemic inflammatory response syndrome) ([place]) 01/16/2015    ROS:   All systems reviewed and negative except as noted in the HPI.   Past Surgical History:  Procedure Laterality Date   AORTIC VALVE REPLACEMENT  01/24/12   APPENDECTOMY     BACK SURGERY  1965 or 1966 lumbar back surgery   BENTALL PROCEDURE  01/24/2012   Procedure: BENTALL PROCEDURE;  Surgeon: Ivin Poot, MD;  Location: [place];  Service: Open Heart Surgery;  Laterality: N/A;   BILATERAL KNEE ARTHROSCOPY     CATARACT EXTRACTION W/PHACO Left 12/03/2016   Procedure: CATARACT EXTRACTION PHACO AND INTRAOCULAR LENS PLACEMENT LEFT EYE;  Surgeon: Tonny Branch, MD;  [facility];  Service: Ophthalmology;  Laterality: Left;  CDE:  10.87   CATARACT EXTRACTION W/PHACO Right 12/13/2016   Procedure: CATARACT EXTRACTION PHACO AND INTRAOCULAR LENS PLACEMENT (IOC);  Surgeon: Tonny Branch, MD;  [facility];  Service: Ophthalmology;  Laterality: Right;  right cde 11.78   CORONARY ARTERY BYPASS GRAFT  01/24/2012   Procedure: CORONARY ARTERY BYPASS GRAFTING (CABG);  Surgeon: Ivin Poot, MD;  Location: [place];  Service: Open Heart Surgery;  Laterality: N/A;    ESOPHAGOGASTRODUODENOSCOPY  06/17/2012   Procedure: ESOPHAGOGASTRODUODENOSCOPY (EGD);  Surgeon: Rogene Houston, MD;  [facility];  Service: Endoscopy;  Laterality: N/A;  ED   ESOPHAGOGASTRODUODENOSCOPY N/A 05/19/2016   Procedure: ESOPHAGOGASTRODUODENOSCOPY (EGD);  Surgeon: Danie Binder, MD;  [facility];  Service: Endoscopy;  Laterality: N/A;   HEMORROIDECTOMY     HERNIA REPAIR     Left inguinal hernia repair     PACEMAKER PLACEMENT     Medtronic 4/13 - Dr. Rayann Heman   PERMANENT PACEMAKER INSERTION N/A 02/01/2012   Procedure: PERMANENT PACEMAKER INSERTION;  Surgeon: Thompson Grayer, MD; [place] [facility] (serial number [id number])       Family History  Problem Relation Age of Onset   Kidney disease Mother    Heart disease Father    Anesthesia problems Neg Hx    Hypotension Neg Hx    Malignant hyperthermia Neg Hx    Pseudochol deficiency Neg Hx      Social History   Socioeconomic History   Marital status: Married    Spouse name: Not on file   Number of children: 4   Years of education: Not on file   Highest education level: Not on file  Occupational History   Occupation: Retired    Comment: Scientist, forensic  Tobacco Use   Smoking status: Never   Smokeless tobacco: Never  Scientific laboratory technician Use: Never used  Substance and Sexual Activity   Alcohol use: No    Alcohol/week: 0.0 standard drinks   Drug use: No   Sexual activity: Never    Birth control/protection: None  Other Topics Concern   Not on file  Social History Narrative   Lives with wife, has been married since 1954.   Social Determinants of Health   Financial Resource Strain: Not on file  Food Insecurity: Not on file  Transportation Needs: Not on file  Physical Activity: Not on file  Stress: Not on file  Social Connections: Not on file  Intimate Partner Violence: Not on file     BP (!) 116/56   Pulse 70   Ht 5\' 5"  (1.651 m)   Wt 198 lb 9.6 oz (90.1 kg)   SpO2 98%    BMI 33.05 kg/m   Physical Exam:  Well appearing NAD HEENT: Unremarkable Neck:  No JVD, no thyromegally Lymphatics:  No adenopathy Back:  No CVA tenderness Lungs:  Clear with no wheezes HEART:  Regular rate rhythm, no murmurs, no rubs, no clicks Abd:  soft, positive bowel sounds, no organomegally, no rebound, no guarding Ext:  2 plus pulses, 2+ edema, no cyanosis, no clubbing Skin:  No rashes no nodules Neuro:  CN II through XII intact, motor grossly intact   DEVICE  Normal device function.  See PaceArt for details.   Assess/Plan:  CHB - he is asymptomatic s/p PPM insertion. PPM -his medtronic DDD PM is working normally but he has been reprogrammed VVIR as he was undersensing some of his atrial fib. Chronic diastolic heart failure - his symptoms are class 2.  Atrial fib - his VR is controlled. He will continue his beta blocker. Peripheral edema - I have asked him to stop amlodipine.   Douglas Overlie Douglas Laden,MD

## 2021-08-15 NOTE — Patient Instructions (Signed)
Medication Instructions:  Your physician recommends that you continue on your current medications as directed. Please refer to the Current Medication list given to you today.  Stop Taking Norvasc   *If you need a refill on your cardiac medications before your next appointment, please call your pharmacy*   Lab Work: NONE   If you have labs (blood work) drawn today and your tests are completely normal, you will receive your results only by:  (if you have MyChart) OR A paper copy in the mail If you have any lab test that is abnormal or we need to change your treatment, we will call you to review the results.   Testing/Procedures: NONE    Follow-Up: At [facility], you and your health needs are our priority.  As part of our continuing mission to provide you with exceptional heart care, we have created designated Provider Care Teams.  These Care Teams include your primary Cardiologist (physician) and Advanced Practice Providers (APPs -  Physician Assistants and Nurse Practitioners) who all work together to provide you with the care you need, when you need it.  We recommend signing up for the patient portal called "MyChart".  Sign up information is provided on this After Visit Summary.  MyChart is used to connect with patients for Virtual Visits (Telemedicine).  Patients are able to view lab/test results, encounter notes, upcoming appointments, etc.  Non-urgent messages can be sent to your provider as well.   To learn more about what you can do with MyChart, go to [email or web address].    Your next appointment:   1 year(s)  The format for your next appointment:   In Person  Provider:   Cristopher Peru, MD   Other Instructions Thank you for choosing [facility]!

## 2021-08-18 DIAGNOSIS — Z23 Encounter for immunization: Secondary | ICD-10-CM | POA: Diagnosis not present

## 2021-08-22 ENCOUNTER — Ambulatory Visit (INDEPENDENT_AMBULATORY_CARE_PROVIDER_SITE_OTHER): Payer: Medicare Other

## 2021-08-22 DIAGNOSIS — I442 Atrioventricular block, complete: Secondary | ICD-10-CM

## 2021-08-22 LAB — CUP PACEART REMOTE DEVICE CHECK
Battery Impedance: 1948 Ohm
Battery Remaining Longevity: 44 mo
Battery Voltage: 2.74 V
Brady Statistic RV Percent Paced: 99 %
Date Time Interrogation Session: 20221101092704
Implantable Lead Implant Date: 20130412
Implantable Lead Implant Date: 20130412
Implantable Lead Location: 753859
Implantable Lead Location: 753860
Implantable Lead Model: 5076
Implantable Lead Model: 5076
Implantable Pulse Generator Implant Date: 20130412
Lead Channel Impedance Value: 544 Ohm
Lead Channel Impedance Value: 67 Ohm
Lead Channel Pacing Threshold Amplitude: 0.625 V
Lead Channel Pacing Threshold Pulse Width: 0.4 ms
Lead Channel Setting Pacing Amplitude: 2 V
Lead Channel Setting Pacing Pulse Width: 0.4 ms
Lead Channel Setting Sensing Sensitivity: 4 mV

## 2021-08-29 NOTE — Progress Notes (Signed)
Remote pacemaker transmission.   

## 2021-08-30 ENCOUNTER — Ambulatory Visit (INDEPENDENT_AMBULATORY_CARE_PROVIDER_SITE_OTHER): Payer: Medicare Other | Admitting: *Deleted

## 2021-08-30 DIAGNOSIS — Z5181 Encounter for therapeutic drug level monitoring: Secondary | ICD-10-CM

## 2021-08-30 DIAGNOSIS — I4891 Unspecified atrial fibrillation: Secondary | ICD-10-CM | POA: Diagnosis not present

## 2021-08-30 DIAGNOSIS — I480 Paroxysmal atrial fibrillation: Secondary | ICD-10-CM

## 2021-08-30 DIAGNOSIS — Z952 Presence of prosthetic heart valve: Secondary | ICD-10-CM

## 2021-08-30 LAB — POCT INR: INR: 2.2 (ref 2.0–3.0)

## 2021-08-30 NOTE — Patient Instructions (Signed)
Continue warfarin 1 tablet daily except 1/2 tablet on Sundays Recheck in 4 weeks

## 2021-09-20 DIAGNOSIS — I5032 Chronic diastolic (congestive) heart failure: Secondary | ICD-10-CM | POA: Diagnosis not present

## 2021-09-20 DIAGNOSIS — M109 Gout, unspecified: Secondary | ICD-10-CM | POA: Diagnosis not present

## 2021-09-20 DIAGNOSIS — Z79899 Other long term (current) drug therapy: Secondary | ICD-10-CM | POA: Diagnosis not present

## 2021-09-20 DIAGNOSIS — I2510 Atherosclerotic heart disease of native coronary artery without angina pectoris: Secondary | ICD-10-CM | POA: Diagnosis not present

## 2021-09-20 DIAGNOSIS — I10 Essential (primary) hypertension: Secondary | ICD-10-CM | POA: Diagnosis not present

## 2021-09-20 DIAGNOSIS — D508 Other iron deficiency anemias: Secondary | ICD-10-CM | POA: Diagnosis not present

## 2021-09-26 ENCOUNTER — Other Ambulatory Visit: Payer: Self-pay | Admitting: Cardiology

## 2021-09-27 DIAGNOSIS — I480 Paroxysmal atrial fibrillation: Secondary | ICD-10-CM | POA: Diagnosis not present

## 2021-09-27 DIAGNOSIS — I10 Essential (primary) hypertension: Secondary | ICD-10-CM | POA: Diagnosis not present

## 2021-09-27 DIAGNOSIS — I5022 Chronic systolic (congestive) heart failure: Secondary | ICD-10-CM | POA: Diagnosis not present

## 2021-09-27 DIAGNOSIS — D6869 Other thrombophilia: Secondary | ICD-10-CM | POA: Diagnosis not present

## 2021-09-28 ENCOUNTER — Encounter: Payer: Self-pay | Admitting: Cardiology

## 2021-09-28 ENCOUNTER — Other Ambulatory Visit: Payer: Self-pay

## 2021-09-28 ENCOUNTER — Ambulatory Visit (INDEPENDENT_AMBULATORY_CARE_PROVIDER_SITE_OTHER): Payer: Medicare Other | Admitting: Cardiology

## 2021-09-28 ENCOUNTER — Ambulatory Visit (INDEPENDENT_AMBULATORY_CARE_PROVIDER_SITE_OTHER): Payer: Medicare Other | Admitting: *Deleted

## 2021-09-28 VITALS — BP 140/86 | HR 73 | Ht 65.0 in | Wt 203.4 lb

## 2021-09-28 DIAGNOSIS — Z952 Presence of prosthetic heart valve: Secondary | ICD-10-CM | POA: Diagnosis not present

## 2021-09-28 DIAGNOSIS — I480 Paroxysmal atrial fibrillation: Secondary | ICD-10-CM | POA: Diagnosis not present

## 2021-09-28 DIAGNOSIS — I25119 Atherosclerotic heart disease of native coronary artery with unspecified angina pectoris: Secondary | ICD-10-CM

## 2021-09-28 DIAGNOSIS — I442 Atrioventricular block, complete: Secondary | ICD-10-CM

## 2021-09-28 DIAGNOSIS — I2510 Atherosclerotic heart disease of native coronary artery without angina pectoris: Secondary | ICD-10-CM | POA: Diagnosis not present

## 2021-09-28 DIAGNOSIS — Z5181 Encounter for therapeutic drug level monitoring: Secondary | ICD-10-CM | POA: Diagnosis not present

## 2021-09-28 DIAGNOSIS — I4819 Other persistent atrial fibrillation: Secondary | ICD-10-CM | POA: Diagnosis not present

## 2021-09-28 LAB — POCT INR: INR: 2.8 (ref 2.0–3.0)

## 2021-09-28 NOTE — Patient Instructions (Signed)
Continue warfarin 1 tablet daily except 1/2 tablet on Sundays Recheck in 5 weeks

## 2021-09-28 NOTE — Patient Instructions (Addendum)

## 2021-09-28 NOTE — Progress Notes (Signed)
Cardiology Office Note  Date: 09/28/2021   ID: Julias, Mould December 31, 1927, MRN [id number]  PCP:  [name], MD  Cardiologist:  Rozann Lesches, MD Electrophysiologist:  None   Chief Complaint  Patient presents with   Cardiac follow-up    History of Present Illness: Douglas [name] is a 87 y.o. male last seen in June by Mr. Leonides Sake NP.  He is here today for a follow-up visit.  He does not report any obvious angina at this time.  Since last encounter he was taken off amlodipine by Dr. Lovena Le due to leg swelling.  He states that this feels somewhat better.  Blood pressure has trended up some, systolic in the 836O today.  He has a Medtronic pacemaker in place with followed by Dr. Lovena Le.  Device interrogation was normal in November.  He remains on Coumadin with follow-up in anticoagulation clinic.  Last INR was 2.2.  No spontaneous bleeding problems reported.  I reviewed the remainder of his medications as noted below.  Past Medical History:  Diagnosis Date   Acute upper GI bleed 05/18/2016   Aneurysm of thoracic aorta    Ascending. 5.7 x 5.5cm - Bentall procedure   Aortic stenosis    Bioprosthetic AVR   Arthritis    Atrial fibrillation ([place])    Documented on pacer interrogation 6/13   Complete heart block (HCC)    Status post pacemaker placement   Coronary atherosclerosis of native coronary artery    Multivessel - LIMA to LAD, SVG to OM, SVG to PDA   DJD (degenerative joint disease)    Esophageal dysmotility    Essential hypertension    Fibromyalgia    Gout    H/O arteriovenous malformation (AVM)    H/O hiatal hernia    Hemorrhoid    History of esophageal ulcer    History of skin cancer    Hx of adenomatous colonic polyps    Iron deficiency anemia due to chronic blood loss    Melena    Pacemaker-Medtronic    PMR (polymyalgia rheumatica) (HCC)    SIRS (systemic inflammatory response syndrome) ([place]) 01/16/2015    Past Surgical History:  Procedure Laterality Date    AORTIC VALVE REPLACEMENT  01/24/12   APPENDECTOMY     BACK SURGERY     1965 or 1966 lumbar back surgery   BENTALL PROCEDURE  01/24/2012   Procedure: BENTALL PROCEDURE;  Surgeon: Ivin Poot, MD;  Location: [place];  Service: Open Heart Surgery;  Laterality: N/A;   BILATERAL KNEE ARTHROSCOPY     CATARACT EXTRACTION W/PHACO Left 12/03/2016   Procedure: CATARACT EXTRACTION PHACO AND INTRAOCULAR LENS PLACEMENT LEFT EYE;  Surgeon: Tonny Branch, MD;  [facility];  Service: Ophthalmology;  Laterality: Left;  CDE:  10.87   CATARACT EXTRACTION W/PHACO Right 12/13/2016   Procedure: CATARACT EXTRACTION PHACO AND INTRAOCULAR LENS PLACEMENT (IOC);  Surgeon: Tonny Branch, MD;  [facility];  Service: Ophthalmology;  Laterality: Right;  right cde 11.78   CORONARY ARTERY BYPASS GRAFT  01/24/2012   Procedure: CORONARY ARTERY BYPASS GRAFTING (CABG);  Surgeon: Ivin Poot, MD;  Location: [facility];  Service: Open Heart Surgery;  Laterality: N/A;   ESOPHAGOGASTRODUODENOSCOPY  06/17/2012   Procedure: ESOPHAGOGASTRODUODENOSCOPY (EGD);  Surgeon: Rogene Houston, MD;  [facility];  Service: Endoscopy;  Laterality: N/A;  ED   ESOPHAGOGASTRODUODENOSCOPY N/A 05/19/2016   Procedure: ESOPHAGOGASTRODUODENOSCOPY (EGD);  Surgeon: Danie Binder, MD;  [facility];  Service:  Endoscopy;  Laterality: N/A;   HEMORROIDECTOMY     HERNIA REPAIR     Left inguinal hernia repair     PACEMAKER PLACEMENT     Medtronic 4/13 - Dr. Rayann Heman   PERMANENT PACEMAKER INSERTION N/A 02/01/2012   Procedure: PERMANENT PACEMAKER INSERTION;  Surgeon: Thompson Grayer, MD; Medtronic Adapta L model ADDRL 1 (serial number [id number])      Current Outpatient Medications  Medication Sig Dispense Refill   acetaminophen (TYLENOL) 500 MG tablet Take 1,000 mg by mouth every 6 (six) hours as needed for mild pain.     allopurinol (ZYLOPRIM) 300 MG tablet Take 300 mg by mouth daily. Take 1  daily     amoxicillin (AMOXIL) 500 MG capsule As  need for dental work.     furosemide (LASIX) 40 MG tablet Take 20 mg by mouth daily.     metoprolol succinate (TOPROL-XL) 25 MG 24 hr tablet TAKE (2) TABLETS BY MOUTH DAILY. 180 tablet 2   nitroGLYCERIN (NITROSTAT) 0.4 MG SL tablet Place 0.4 mg under the tongue every 5 (five) minutes x 3 doses as needed for chest pain (if no relief after 2nd dose, proceed to the ED for an evaluation or call 911).     pantoprazole (PROTONIX) 40 MG tablet TAKE ONE TABLET BY MOUTH ONCE DAILY IN THE MORNING. 30 tablet 6   pravastatin (PRAVACHOL) 20 MG tablet Take 20 mg by mouth every evening.   11   sennosides-docusate sodium (SENOKOT-S) 8.6-50 MG tablet Take 1 tablet by mouth 2 (two) times daily.     warfarin (COUMADIN) 5 MG tablet TAKE 1 TABLET BY MOUTH ONCE DAILY OR AS DIRECTED. 90 tablet 4   No current facility-administered medications for this visit.   Allergies:  Imdur [isosorbide nitrate]   ROS: No palpitations.  No syncope.  Physical Exam: VS:  BP 140/86   Pulse 73   Ht 5\' 5"  (1.651 m)   Wt 203 lb 6.4 oz (92.3 kg)   SpO2 99%   BMI 33.85 kg/m , BMI Body mass index is 33.85 kg/m.  Wt Readings from Last 3 Encounters:  09/28/21 203 lb 6.4 oz (92.3 kg)  08/15/21 198 lb 9.6 oz (90.1 kg)  04/06/21 203 lb (92.1 kg)    General: Elderly male, appears comfortable at rest. HEENT: Conjunctiva and lids normal, wearing a mask. Neck: Supple, no elevated JVP or carotid bruits, no thyromegaly. Lungs: Clear to auscultation, nonlabored breathing at rest. Cardiac: Regular rate and rhythm, no S3, 2/6 systolic murmur. Extremities: 1-2+ lower leg edema and venous stasis.  ECG:  An ECG dated 03/24/2021 was personally reviewed today and demonstrated:  Ventricular paced rhythm.  Recent Labwork: 03/03/2021: ALT 14; AST 21; BUN 29; Creatinine, Ser 1.38; Hemoglobin 12.0; Platelets 204; Potassium 3.6; Sodium 138   Other Studies Reviewed Today:  Echocardiogram 09/22/2018: - Left ventricle: The cavity size was normal.  Wall thickness was    increased in a pattern of mild LVH. Systolic function was normal.    The estimated ejection fraction was in the range of 55% to 60%.    There is mild hypokinesis of the apical anteroseptal myocardium,    possibly related to RV pacing. Indeterminate diastolic function.  - Aortic valve: History of Bental procedure with 23 mm Edwards    pericardial valve along with 60mm Gelweave Valsalva ascending    aorta graft. Transvalvular velocity is in normal range.  - Mitral valve: Mildly calcified annulus. There was trivial    regurgitation.  -  Left atrium: The atrium was mildly to moderately dilated.  - Right ventricle: Pacer wire or catheter noted in right ventricle.  - Right atrium: The atrium was at the upper limits of normal in    size.  - Atrial septum: No defect or patent foramen ovale was identified.  - Tricuspid valve: There was trivial regurgitation.  - Pulmonary arteries: PA peak pressure: 23 mm Hg (S).  - Pericardium, extracardiac: A prominent pericardial fat pad was    present.   Assessment and Plan:  1.  Persistent atrial fibrillation with CHA2DS2-VASc score of 4.  He remains on Coumadin for stroke prophylaxis, follow-up INR today.  No sense of palpitations on current regimen including Toprol-XL.  2.  Complete heart block with Medtronic pacemaker in place and routine follow-up with Dr. Lovena Le.  3.  Multivessel CAD status post CABG in 2013.  He is not reporting any active angina at this time.  No longer on Norvasc due to worsening leg swelling.  He remains on Toprol-XL and Pravachol otherwise, has as needed nitroglycerin available.  Medication Adjustments/Labs and Tests Ordered: Current medicines are reviewed at length with the patient today.  Concerns regarding medicines are outlined above.   Tests Ordered: No orders of the defined types were placed in this encounter.   Medication Changes: No orders of the defined types were placed in this  encounter.   Disposition:  Follow up  6 months.  Signed, Satira Sark, MD, [facility] 09/28/2021 10:48 AM    [facility] [place], [place], [place] [address] Phone: [phone]; Fax: [phone]

## 2021-10-25 ENCOUNTER — Other Ambulatory Visit: Payer: Self-pay | Admitting: Cardiology

## 2021-11-02 ENCOUNTER — Ambulatory Visit (INDEPENDENT_AMBULATORY_CARE_PROVIDER_SITE_OTHER): Payer: Medicare Other | Admitting: *Deleted

## 2021-11-02 DIAGNOSIS — Z952 Presence of prosthetic heart valve: Secondary | ICD-10-CM | POA: Diagnosis not present

## 2021-11-02 DIAGNOSIS — I480 Paroxysmal atrial fibrillation: Secondary | ICD-10-CM | POA: Diagnosis not present

## 2021-11-02 DIAGNOSIS — Z5181 Encounter for therapeutic drug level monitoring: Secondary | ICD-10-CM | POA: Diagnosis not present

## 2021-11-02 LAB — POCT INR: INR: 2.8 (ref 2.0–3.0)

## 2021-11-02 NOTE — Patient Instructions (Signed)
Continue warfarin 1 tablet daily except 1/2 tablet on Sundays Recheck in 6 weeks 

## 2021-11-19 DIAGNOSIS — I2510 Atherosclerotic heart disease of native coronary artery without angina pectoris: Secondary | ICD-10-CM | POA: Diagnosis not present

## 2021-11-19 DIAGNOSIS — I10 Essential (primary) hypertension: Secondary | ICD-10-CM | POA: Diagnosis not present

## 2021-11-21 ENCOUNTER — Ambulatory Visit (INDEPENDENT_AMBULATORY_CARE_PROVIDER_SITE_OTHER): Payer: Medicare Other

## 2021-11-21 DIAGNOSIS — I442 Atrioventricular block, complete: Secondary | ICD-10-CM

## 2021-11-21 LAB — CUP PACEART REMOTE DEVICE CHECK
Battery Impedance: 2036 Ohm
Battery Remaining Longevity: 42 mo
Battery Voltage: 2.74 V
Brady Statistic RV Percent Paced: 100 %
Date Time Interrogation Session: 20230131093217
Implantable Lead Implant Date: 20130412
Implantable Lead Implant Date: 20130412
Implantable Lead Location: 753859
Implantable Lead Location: 753860
Implantable Lead Model: 5076
Implantable Lead Model: 5076
Implantable Pulse Generator Implant Date: 20130412
Lead Channel Impedance Value: 582 Ohm
Lead Channel Impedance Value: 67 Ohm
Lead Channel Pacing Threshold Amplitude: 0.625 V
Lead Channel Pacing Threshold Pulse Width: 0.4 ms
Lead Channel Setting Pacing Amplitude: 2 V
Lead Channel Setting Pacing Pulse Width: 0.4 ms
Lead Channel Setting Sensing Sensitivity: 4 mV

## 2021-11-29 NOTE — Progress Notes (Signed)
Remote pacemaker transmission.   

## 2021-12-14 ENCOUNTER — Ambulatory Visit (INDEPENDENT_AMBULATORY_CARE_PROVIDER_SITE_OTHER): Payer: Medicare Other | Admitting: *Deleted

## 2021-12-14 DIAGNOSIS — Z5181 Encounter for therapeutic drug level monitoring: Secondary | ICD-10-CM | POA: Diagnosis not present

## 2021-12-14 DIAGNOSIS — I480 Paroxysmal atrial fibrillation: Secondary | ICD-10-CM

## 2021-12-14 DIAGNOSIS — Z952 Presence of prosthetic heart valve: Secondary | ICD-10-CM

## 2021-12-14 LAB — POCT INR: INR: 2.6 (ref 2.0–3.0)

## 2021-12-14 NOTE — Patient Instructions (Signed)
Continue warfarin 1 tablet daily except 1/2 tablet on Sundays Recheck in 6 weeks 

## 2022-01-02 DIAGNOSIS — M1711 Unilateral primary osteoarthritis, right knee: Secondary | ICD-10-CM | POA: Diagnosis not present

## 2022-01-12 DIAGNOSIS — I5032 Chronic diastolic (congestive) heart failure: Secondary | ICD-10-CM | POA: Diagnosis not present

## 2022-01-12 DIAGNOSIS — I10 Essential (primary) hypertension: Secondary | ICD-10-CM | POA: Diagnosis not present

## 2022-01-12 DIAGNOSIS — Z79899 Other long term (current) drug therapy: Secondary | ICD-10-CM | POA: Diagnosis not present

## 2022-01-19 DIAGNOSIS — I5030 Unspecified diastolic (congestive) heart failure: Secondary | ICD-10-CM | POA: Diagnosis not present

## 2022-01-19 DIAGNOSIS — I10 Essential (primary) hypertension: Secondary | ICD-10-CM | POA: Diagnosis not present

## 2022-01-19 DIAGNOSIS — D696 Thrombocytopenia, unspecified: Secondary | ICD-10-CM | POA: Diagnosis not present

## 2022-01-25 ENCOUNTER — Ambulatory Visit (INDEPENDENT_AMBULATORY_CARE_PROVIDER_SITE_OTHER): Payer: Medicare Other | Admitting: *Deleted

## 2022-01-25 DIAGNOSIS — I480 Paroxysmal atrial fibrillation: Secondary | ICD-10-CM

## 2022-01-25 DIAGNOSIS — Z952 Presence of prosthetic heart valve: Secondary | ICD-10-CM

## 2022-01-25 DIAGNOSIS — Z5181 Encounter for therapeutic drug level monitoring: Secondary | ICD-10-CM | POA: Diagnosis not present

## 2022-01-25 LAB — POCT INR: INR: 2.2 (ref 2.0–3.0)

## 2022-01-25 NOTE — Patient Instructions (Signed)
Continue warfarin 1 tablet daily except 1/2 tablet on Sundays Recheck in 6 weeks 

## 2022-02-20 ENCOUNTER — Ambulatory Visit (INDEPENDENT_AMBULATORY_CARE_PROVIDER_SITE_OTHER): Payer: Medicare Other

## 2022-02-20 DIAGNOSIS — I442 Atrioventricular block, complete: Secondary | ICD-10-CM

## 2022-02-20 LAB — CUP PACEART REMOTE DEVICE CHECK
Battery Impedance: 2127 Ohm
Battery Remaining Longevity: 40 mo
Battery Voltage: 2.74 V
Brady Statistic RV Percent Paced: 100 %
Date Time Interrogation Session: 20230502074409
Implantable Lead Implant Date: 20130412
Implantable Lead Implant Date: 20130412
Implantable Lead Location: 753859
Implantable Lead Location: 753860
Implantable Lead Model: 5076
Implantable Lead Model: 5076
Implantable Pulse Generator Implant Date: 20130412
Lead Channel Impedance Value: 565 Ohm
Lead Channel Impedance Value: 67 Ohm
Lead Channel Pacing Threshold Amplitude: 0.625 V
Lead Channel Pacing Threshold Pulse Width: 0.4 ms
Lead Channel Setting Pacing Amplitude: 2 V
Lead Channel Setting Pacing Pulse Width: 0.4 ms
Lead Channel Setting Sensing Sensitivity: 4 mV

## 2022-03-06 NOTE — Progress Notes (Signed)
Remote pacemaker transmission.   

## 2022-03-08 ENCOUNTER — Ambulatory Visit (INDEPENDENT_AMBULATORY_CARE_PROVIDER_SITE_OTHER): Payer: Medicare Other | Admitting: *Deleted

## 2022-03-08 DIAGNOSIS — I4891 Unspecified atrial fibrillation: Secondary | ICD-10-CM | POA: Diagnosis not present

## 2022-03-08 DIAGNOSIS — I480 Paroxysmal atrial fibrillation: Secondary | ICD-10-CM

## 2022-03-08 DIAGNOSIS — Z952 Presence of prosthetic heart valve: Secondary | ICD-10-CM | POA: Diagnosis not present

## 2022-03-08 DIAGNOSIS — Z5181 Encounter for therapeutic drug level monitoring: Secondary | ICD-10-CM | POA: Diagnosis not present

## 2022-03-08 LAB — POCT INR: INR: 2.2 (ref 2.0–3.0)

## 2022-03-08 NOTE — Patient Instructions (Signed)
Continue warfarin 1 tablet daily except 1/2 tablet on Sundays Recheck in 6 weeks 

## 2022-03-14 DIAGNOSIS — M25571 Pain in right ankle and joints of right foot: Secondary | ICD-10-CM | POA: Insufficient documentation

## 2022-03-15 DIAGNOSIS — I10 Essential (primary) hypertension: Secondary | ICD-10-CM | POA: Diagnosis not present

## 2022-03-15 DIAGNOSIS — D6869 Other thrombophilia: Secondary | ICD-10-CM | POA: Diagnosis not present

## 2022-03-15 DIAGNOSIS — Z954 Presence of other heart-valve replacement: Secondary | ICD-10-CM | POA: Diagnosis not present

## 2022-03-15 DIAGNOSIS — M1711 Unilateral primary osteoarthritis, right knee: Secondary | ICD-10-CM | POA: Diagnosis not present

## 2022-03-15 DIAGNOSIS — I5032 Chronic diastolic (congestive) heart failure: Secondary | ICD-10-CM | POA: Diagnosis not present

## 2022-03-22 DIAGNOSIS — E785 Hyperlipidemia, unspecified: Secondary | ICD-10-CM | POA: Diagnosis not present

## 2022-03-22 DIAGNOSIS — I712 Thoracic aortic aneurysm, without rupture, unspecified: Secondary | ICD-10-CM | POA: Diagnosis not present

## 2022-03-22 DIAGNOSIS — Z6831 Body mass index (BMI) 31.0-31.9, adult: Secondary | ICD-10-CM | POA: Diagnosis not present

## 2022-03-22 DIAGNOSIS — I480 Paroxysmal atrial fibrillation: Secondary | ICD-10-CM | POA: Diagnosis not present

## 2022-03-22 DIAGNOSIS — I5041 Acute combined systolic (congestive) and diastolic (congestive) heart failure: Secondary | ICD-10-CM | POA: Diagnosis not present

## 2022-03-22 DIAGNOSIS — Q246 Congenital heart block: Secondary | ICD-10-CM | POA: Diagnosis not present

## 2022-04-13 DIAGNOSIS — M1711 Unilateral primary osteoarthritis, right knee: Secondary | ICD-10-CM | POA: Diagnosis not present

## 2022-04-17 ENCOUNTER — Ambulatory Visit (INDEPENDENT_AMBULATORY_CARE_PROVIDER_SITE_OTHER): Payer: Medicare Other | Admitting: *Deleted

## 2022-04-17 ENCOUNTER — Encounter: Payer: Self-pay | Admitting: Cardiology

## 2022-04-17 ENCOUNTER — Ambulatory Visit (INDEPENDENT_AMBULATORY_CARE_PROVIDER_SITE_OTHER): Payer: Medicare Other | Admitting: Cardiology

## 2022-04-17 VITALS — BP 152/82 | HR 73 | Ht 65.0 in | Wt 196.2 lb

## 2022-04-17 DIAGNOSIS — Z950 Presence of cardiac pacemaker: Secondary | ICD-10-CM

## 2022-04-17 DIAGNOSIS — Z953 Presence of xenogenic heart valve: Secondary | ICD-10-CM | POA: Diagnosis not present

## 2022-04-17 DIAGNOSIS — I480 Paroxysmal atrial fibrillation: Secondary | ICD-10-CM

## 2022-04-17 DIAGNOSIS — I4891 Unspecified atrial fibrillation: Secondary | ICD-10-CM

## 2022-04-17 DIAGNOSIS — Z5181 Encounter for therapeutic drug level monitoring: Secondary | ICD-10-CM | POA: Diagnosis not present

## 2022-04-17 DIAGNOSIS — I25119 Atherosclerotic heart disease of native coronary artery with unspecified angina pectoris: Secondary | ICD-10-CM | POA: Diagnosis not present

## 2022-04-17 DIAGNOSIS — Z952 Presence of prosthetic heart valve: Secondary | ICD-10-CM | POA: Diagnosis not present

## 2022-04-17 LAB — POCT INR: INR: 2.5 (ref 2.0–3.0)

## 2022-04-19 DIAGNOSIS — D3617 Benign neoplasm of peripheral nerves and autonomic nervous system of trunk, unspecified: Secondary | ICD-10-CM | POA: Diagnosis not present

## 2022-04-19 DIAGNOSIS — L570 Actinic keratosis: Secondary | ICD-10-CM | POA: Diagnosis not present

## 2022-04-19 DIAGNOSIS — L821 Other seborrheic keratosis: Secondary | ICD-10-CM | POA: Diagnosis not present

## 2022-04-19 DIAGNOSIS — L814 Other melanin hyperpigmentation: Secondary | ICD-10-CM | POA: Diagnosis not present

## 2022-04-19 DIAGNOSIS — Z85828 Personal history of other malignant neoplasm of skin: Secondary | ICD-10-CM | POA: Diagnosis not present

## 2022-04-19 DIAGNOSIS — C44329 Squamous cell carcinoma of skin of other parts of face: Secondary | ICD-10-CM | POA: Diagnosis not present

## 2022-04-23 ENCOUNTER — Telehealth: Payer: Self-pay | Admitting: Cardiology

## 2022-04-23 NOTE — Telephone Encounter (Signed)
Calling to get a copy of patient last INR. Fax # [phone]. Please advise

## 2022-04-23 NOTE — Telephone Encounter (Signed)
Result faxed per request.

## 2022-05-22 ENCOUNTER — Ambulatory Visit (INDEPENDENT_AMBULATORY_CARE_PROVIDER_SITE_OTHER): Payer: Medicare Other

## 2022-05-22 DIAGNOSIS — I442 Atrioventricular block, complete: Secondary | ICD-10-CM | POA: Diagnosis not present

## 2022-05-22 LAB — CUP PACEART REMOTE DEVICE CHECK
Battery Impedance: 2193 Ohm
Battery Remaining Longevity: 39 mo
Battery Voltage: 2.74 V
Brady Statistic RV Percent Paced: 100 %
Date Time Interrogation Session: 20230801080107
Implantable Lead Implant Date: 20130412
Implantable Lead Implant Date: 20130412
Implantable Lead Location: 753859
Implantable Lead Location: 753860
Implantable Lead Model: 5076
Implantable Lead Model: 5076
Implantable Pulse Generator Implant Date: 20130412
Lead Channel Impedance Value: 563 Ohm
Lead Channel Impedance Value: 67 Ohm
Lead Channel Pacing Threshold Amplitude: 0.625 V
Lead Channel Pacing Threshold Pulse Width: 0.4 ms
Lead Channel Setting Pacing Amplitude: 2 V
Lead Channel Setting Pacing Pulse Width: 0.4 ms
Lead Channel Setting Sensing Sensitivity: 4 mV

## 2022-05-29 ENCOUNTER — Ambulatory Visit (INDEPENDENT_AMBULATORY_CARE_PROVIDER_SITE_OTHER): Payer: Medicare Other | Admitting: *Deleted

## 2022-05-29 DIAGNOSIS — I480 Paroxysmal atrial fibrillation: Secondary | ICD-10-CM

## 2022-05-29 DIAGNOSIS — Z952 Presence of prosthetic heart valve: Secondary | ICD-10-CM | POA: Diagnosis not present

## 2022-05-29 DIAGNOSIS — Z5181 Encounter for therapeutic drug level monitoring: Secondary | ICD-10-CM

## 2022-05-29 LAB — POCT INR: INR: 2.1 (ref 2.0–3.0)

## 2022-05-29 NOTE — Patient Instructions (Signed)
Continue warfarin 1 tablet daily except 1/2 tablet on Sundays Recheck in 6 weeks 

## 2022-06-18 DIAGNOSIS — I5032 Chronic diastolic (congestive) heart failure: Secondary | ICD-10-CM | POA: Diagnosis not present

## 2022-06-18 DIAGNOSIS — E785 Hyperlipidemia, unspecified: Secondary | ICD-10-CM | POA: Diagnosis not present

## 2022-06-18 DIAGNOSIS — I2510 Atherosclerotic heart disease of native coronary artery without angina pectoris: Secondary | ICD-10-CM | POA: Diagnosis not present

## 2022-06-18 DIAGNOSIS — R7301 Impaired fasting glucose: Secondary | ICD-10-CM | POA: Diagnosis not present

## 2022-06-18 DIAGNOSIS — I10 Essential (primary) hypertension: Secondary | ICD-10-CM | POA: Diagnosis not present

## 2022-06-18 DIAGNOSIS — Z79899 Other long term (current) drug therapy: Secondary | ICD-10-CM | POA: Diagnosis not present

## 2022-06-20 NOTE — Progress Notes (Signed)
Remote pacemaker transmission.   

## 2022-06-22 DIAGNOSIS — I10 Essential (primary) hypertension: Secondary | ICD-10-CM | POA: Diagnosis not present

## 2022-06-22 DIAGNOSIS — N400 Enlarged prostate without lower urinary tract symptoms: Secondary | ICD-10-CM | POA: Diagnosis not present

## 2022-06-22 DIAGNOSIS — I5022 Chronic systolic (congestive) heart failure: Secondary | ICD-10-CM | POA: Diagnosis not present

## 2022-07-03 ENCOUNTER — Other Ambulatory Visit: Payer: Self-pay | Admitting: Cardiology

## 2022-07-04 DIAGNOSIS — C44222 Squamous cell carcinoma of skin of right ear and external auricular canal: Secondary | ICD-10-CM | POA: Diagnosis not present

## 2022-07-04 DIAGNOSIS — Z85828 Personal history of other malignant neoplasm of skin: Secondary | ICD-10-CM | POA: Diagnosis not present

## 2022-07-09 DIAGNOSIS — H01001 Unspecified blepharitis right upper eyelid: Secondary | ICD-10-CM | POA: Diagnosis not present

## 2022-07-10 ENCOUNTER — Ambulatory Visit: Payer: Medicare Other | Attending: Cardiology | Admitting: *Deleted

## 2022-07-10 DIAGNOSIS — C44222 Squamous cell carcinoma of skin of right ear and external auricular canal: Secondary | ICD-10-CM | POA: Diagnosis not present

## 2022-07-10 DIAGNOSIS — Z952 Presence of prosthetic heart valve: Secondary | ICD-10-CM | POA: Diagnosis not present

## 2022-07-10 DIAGNOSIS — Z5181 Encounter for therapeutic drug level monitoring: Secondary | ICD-10-CM | POA: Diagnosis not present

## 2022-07-10 DIAGNOSIS — I4891 Unspecified atrial fibrillation: Secondary | ICD-10-CM | POA: Insufficient documentation

## 2022-07-10 DIAGNOSIS — I480 Paroxysmal atrial fibrillation: Secondary | ICD-10-CM | POA: Insufficient documentation

## 2022-07-10 DIAGNOSIS — Z85828 Personal history of other malignant neoplasm of skin: Secondary | ICD-10-CM | POA: Diagnosis not present

## 2022-07-10 LAB — POCT INR: INR: 2.2 (ref 2.0–3.0)

## 2022-07-10 NOTE — Patient Instructions (Signed)
Continue warfarin 1 tablet daily except 1/2 tablet on Sundays Recheck in 6 weeks 

## 2022-07-26 ENCOUNTER — Other Ambulatory Visit (INDEPENDENT_AMBULATORY_CARE_PROVIDER_SITE_OTHER): Payer: Self-pay | Admitting: Gastroenterology

## 2022-07-27 ENCOUNTER — Other Ambulatory Visit (INDEPENDENT_AMBULATORY_CARE_PROVIDER_SITE_OTHER): Payer: Self-pay | Admitting: Gastroenterology

## 2022-07-30 DIAGNOSIS — Z23 Encounter for immunization: Secondary | ICD-10-CM | POA: Diagnosis not present

## 2022-08-07 ENCOUNTER — Encounter: Payer: Self-pay | Admitting: Internal Medicine

## 2022-08-07 ENCOUNTER — Ambulatory Visit: Payer: Medicare Other | Attending: Internal Medicine | Admitting: Internal Medicine

## 2022-08-07 VITALS — BP 134/70 | HR 71 | Ht 65.0 in | Wt 194.6 lb

## 2022-08-07 DIAGNOSIS — I442 Atrioventricular block, complete: Secondary | ICD-10-CM | POA: Diagnosis not present

## 2022-08-07 DIAGNOSIS — I25119 Atherosclerotic heart disease of native coronary artery with unspecified angina pectoris: Secondary | ICD-10-CM | POA: Diagnosis not present

## 2022-08-07 LAB — CUP PACEART INCLINIC DEVICE CHECK
Battery Impedance: 2223 Ohm
Battery Remaining Longevity: 38 mo
Battery Voltage: 2.74 V
Brady Statistic RV Percent Paced: 100 %
Date Time Interrogation Session: 20231017110950
Implantable Lead Implant Date: 20130412
Implantable Lead Implant Date: 20130412
Implantable Lead Location: 753859
Implantable Lead Location: 753860
Implantable Lead Model: 5076
Implantable Lead Model: 5076
Implantable Pulse Generator Implant Date: 20130412
Lead Channel Impedance Value: 513 Ohm
Lead Channel Impedance Value: 67 Ohm
Lead Channel Pacing Threshold Amplitude: 0.75 V
Lead Channel Pacing Threshold Amplitude: 0.875 V
Lead Channel Pacing Threshold Pulse Width: 0.4 ms
Lead Channel Pacing Threshold Pulse Width: 0.4 ms
Lead Channel Setting Pacing Amplitude: 2 V
Lead Channel Setting Pacing Pulse Width: 0.4 ms
Lead Channel Setting Sensing Sensitivity: 4 mV

## 2022-08-07 NOTE — Progress Notes (Signed)
HPI Douglas [name] returns today for followup. He has a h/o Bental/AVR and CHB, s/p PPM insertion several years ago. He has remained on coumadin therapy. He denies chest pain, sob, or syncope. Very mild peripheral edema. No bleeding on coumadin. He has class 2 dyspnea. He has class 2 peripheral edema.  Allergies  Allergen Reactions   Imdur [Isosorbide Nitrate] Other (See Comments)    Nausea and lightheaded      Current Outpatient Medications  Medication Sig Dispense Refill   acetaminophen (TYLENOL) 500 MG tablet Take 1,000 mg by mouth every 6 (six) hours as needed for mild pain.     allopurinol (ZYLOPRIM) 300 MG tablet Take 300 mg by mouth daily. Take 1  daily     amLODipine (NORVASC) 2.5 MG tablet      amoxicillin (AMOXIL) 500 MG capsule As need for dental work.     furosemide (LASIX) 40 MG tablet Take 20 mg by mouth daily.     metoprolol succinate (TOPROL-XL) 25 MG 24 hr tablet TAKE (2) TABLETS BY MOUTH DAILY. 180 tablet 1   nitroGLYCERIN (NITROSTAT) 0.4 MG SL tablet Place 0.4 mg under the tongue every 5 (five) minutes x 3 doses as needed for chest pain (if no relief after 2nd dose, proceed to the ED for an evaluation or call 911).     pantoprazole (PROTONIX) 40 MG tablet TAKE ONE TABLET BY MOUTH ONCE DAILY IN THE MORNING. 30 tablet 6   pravastatin (PRAVACHOL) 20 MG tablet Take 20 mg by mouth every evening.   11   sennosides-docusate sodium (SENOKOT-S) 8.6-50 MG tablet Take 1 tablet by mouth 2 (two) times daily.     warfarin (COUMADIN) 5 MG tablet TAKE 1 TABLET BY MOUTH ONCE DAILY EXCEPT 1/2 TABLET ON 'SUNDAYS OR AS DIRECTED. 90 tablet 3   No current facility-administered medications for this visit.     Past Medical History:  Diagnosis Date   Acute upper GI bleed 05/18/2016   Aneurysm of thoracic aorta (HCC)    Ascending. 5.7 x 5.5cm - Bentall procedure   Aortic stenosis    Bioprosthetic AVR   Arthritis    Atrial fibrillation (HCC)    Documented on pacer interrogation 6/13    Complete heart block (HCC)    Status post pacemaker placement   Coronary atherosclerosis of native coronary artery    Multivessel - LIMA to LAD, SVG to OM, SVG to PDA   DJD (degenerative joint disease)    Esophageal dysmotility    Essential hypertension    Fibromyalgia    Gout    H/O arteriovenous malformation (AVM)    H/O hiatal hernia    Hemorrhoid    History of esophageal ulcer    History of skin cancer    Hx of adenomatous colonic polyps    Iron deficiency anemia due to chronic blood loss    Melena    Pacemaker-Medtronic    PMR (polymyalgia rheumatica) (HCC)    SIRS (systemic inflammatory response syndrome) (HCC) 01/16/2015    ROS:   All systems reviewed and negative except as noted in the HPI.   Past Surgical History:  Procedure Laterality Date   AORTIC VALVE REPLACEMENT  01/24/12   APPENDECTOMY     BACK SURGERY     19'$ 65 or 1966 lumbar back surgery   BENTALL PROCEDURE  01/24/2012   Procedure: BENTALL PROCEDURE;  Surgeon: Ivin Poot, MD;  Location: [place];  Service: Open Heart Surgery;  Laterality: N/A;  BILATERAL KNEE ARTHROSCOPY     CATARACT EXTRACTION W/PHACO Left 12/03/2016   Procedure: CATARACT EXTRACTION PHACO AND INTRAOCULAR LENS PLACEMENT LEFT EYE;  Surgeon: Tonny Branch, MD;  [facility];  Service: Ophthalmology;  Laterality: Left;  CDE:  10.87   CATARACT EXTRACTION W/PHACO Right 12/13/2016   Procedure: CATARACT EXTRACTION PHACO AND INTRAOCULAR LENS PLACEMENT (IOC);  Surgeon: Tonny Branch, MD;  [facility];  Service: Ophthalmology;  Laterality: Right;  right cde 11.78   CORONARY ARTERY BYPASS GRAFT  01/24/2012   Procedure: CORONARY ARTERY BYPASS GRAFTING (CABG);  Surgeon: Ivin Poot, MD;  Location: [place];  Service: Open Heart Surgery;  Laterality: N/A;   ESOPHAGOGASTRODUODENOSCOPY  06/17/2012   Procedure: ESOPHAGOGASTRODUODENOSCOPY (EGD);  Surgeon: Rogene Houston, MD;  [facility];  Service: Endoscopy;  Laterality: N/A;  ED    ESOPHAGOGASTRODUODENOSCOPY N/A 05/19/2016   Procedure: ESOPHAGOGASTRODUODENOSCOPY (EGD);  Surgeon: Danie Binder, MD;  [facility];  Service: Endoscopy;  Laterality: N/A;   HEMORROIDECTOMY     HERNIA REPAIR     Left inguinal hernia repair     PACEMAKER PLACEMENT     Medtronic 4/13 - Dr. Rayann Heman   PERMANENT PACEMAKER INSERTION N/A 02/01/2012   Procedure: PERMANENT PACEMAKER INSERTION;  Surgeon: Thompson Grayer, MD; [place] 1 (serial number [id number])       Family History  Problem Relation Age of Onset   Kidney disease Mother    Heart disease Father    Anesthesia problems Neg Hx    Hypotension Neg Hx    Malignant hyperthermia Neg Hx    Pseudochol deficiency Neg Hx      Social History   Socioeconomic History   Marital status: Married    Spouse name: Not on file   Number of children: 4   Years of education: Not on file   Highest education level: Not on file  Occupational History   Occupation: Retired    Comment: Scientist, forensic  Tobacco Use   Smoking status: Never   Smokeless tobacco: Never  Scientific laboratory technician Use: Never used  Substance and Sexual Activity   Alcohol use: No    Alcohol/week: 0.0 standard drinks of alcohol   Drug use: No   Sexual activity: Never    Birth control/protection: None  Other Topics Concern   Not on file  Social History Narrative   Lives with wife, has been married since 1954.   Social Determinants of Health   Financial Resource Strain: Not on file  Food Insecurity: Not on file  Transportation Needs: Not on file  Physical Activity: Not on file  Stress: Not on file  Social Connections: Not on file  Intimate Partner Violence: Not on file     BP 134/70   Pulse 71   Ht '5\' 5"'$  (1.651 m)   Wt 194 lb 9.6 oz (88.3 kg)   SpO2 99%   BMI 32.38 kg/m   Physical Exam:  Well appearing NAD HEENT: Unremarkable Neck:  No JVD, no thyromegally Lymphatics:  No adenopathy Back:  No CVA tenderness Lungs:  Clear  with no wheezes HEART:  Regular rate rhythm, no murmurs, no rubs, no clicks Abd:  soft, positive bowel sounds, no organomegally, no rebound, no guarding Ext:  2 plus pulses, no edema, no cyanosis, no clubbing Skin:  No rashes no nodules Neuro:  CN II through XII intact, motor grossly intact  DEVICE  Normal device function.  See PaceArt for details.  Assess/Plan:   CHB - he is asymptomatic s/p PPM insertion. PPM -his medtronic DDD PM is working normally but he has been reprogrammed VVIR as he was undersensing some of his atrial fib. Chronic diastolic heart failure - his symptoms are class 2.  Atrial fib - his VR is controlled. He will continue his beta blocker.    Carleene Overlie Deserae Jennings,MD

## 2022-08-07 NOTE — Patient Instructions (Signed)
Medication Instructions:  Your physician recommends that you continue on your current medications as directed. Please refer to the Current Medication list given to you today.  *If you need a refill on your cardiac medications before your next appointment, please call your pharmacy*   Lab Work: NONE   If you have labs (blood work) drawn today and your tests are completely normal, you will receive your results only by: MyChart Message (if you have MyChart) OR A paper copy in the mail If you have any lab test that is abnormal or we need to change your treatment, we will call you to review the results.   Testing/Procedures: NONE    Follow-Up: At [facility], you and your health needs are our priority.  As part of our continuing mission to provide you with exceptional heart care, we have created designated Provider Care Teams.  These Care Teams include your primary Cardiologist (physician) and Advanced Practice Providers (APPs -  Physician Assistants and Nurse Practitioners) who all work together to provide you with the care you need, when you need it.  We recommend signing up for the patient portal called "MyChart".  Sign up information is provided on this After Visit Summary.  MyChart is used to connect with patients for Virtual Visits (Telemedicine).  Patients are able to view lab/test results, encounter notes, upcoming appointments, etc.  Non-urgent messages can be sent to your provider as well.   To learn more about what you can do with MyChart, go to [email or web address].    Your next appointment:   1 year(s)  The format for your next appointment:   In Person  Provider:   Gregg Taylor, MD    Other Instructions Thank you for choosing [facility]!    Important Information About Sugar       

## 2022-08-20 DIAGNOSIS — I872 Venous insufficiency (chronic) (peripheral): Secondary | ICD-10-CM | POA: Diagnosis not present

## 2022-08-21 ENCOUNTER — Ambulatory Visit: Payer: Medicare Other | Attending: Cardiology | Admitting: *Deleted

## 2022-08-21 ENCOUNTER — Ambulatory Visit (INDEPENDENT_AMBULATORY_CARE_PROVIDER_SITE_OTHER): Payer: Medicare Other

## 2022-08-21 DIAGNOSIS — Z5181 Encounter for therapeutic drug level monitoring: Secondary | ICD-10-CM | POA: Diagnosis not present

## 2022-08-21 DIAGNOSIS — I4891 Unspecified atrial fibrillation: Secondary | ICD-10-CM | POA: Diagnosis not present

## 2022-08-21 DIAGNOSIS — Z952 Presence of prosthetic heart valve: Secondary | ICD-10-CM

## 2022-08-21 DIAGNOSIS — I480 Paroxysmal atrial fibrillation: Secondary | ICD-10-CM

## 2022-08-21 DIAGNOSIS — I442 Atrioventricular block, complete: Secondary | ICD-10-CM | POA: Diagnosis not present

## 2022-08-21 LAB — CUP PACEART REMOTE DEVICE CHECK
Battery Impedance: 2226 Ohm
Battery Remaining Longevity: 38 mo
Battery Voltage: 2.74 V
Brady Statistic RV Percent Paced: 100 %
Date Time Interrogation Session: 20231031101346
Implantable Lead Connection Status: 753985
Implantable Lead Connection Status: 753985
Implantable Lead Implant Date: 20130412
Implantable Lead Implant Date: 20130412
Implantable Lead Location: 753859
Implantable Lead Location: 753860
Implantable Lead Model: 5076
Implantable Lead Model: 5076
Implantable Pulse Generator Implant Date: 20130412
Lead Channel Impedance Value: 535 Ohm
Lead Channel Impedance Value: 67 Ohm
Lead Channel Pacing Threshold Amplitude: 0.75 V
Lead Channel Pacing Threshold Pulse Width: 0.4 ms
Lead Channel Setting Pacing Amplitude: 2 V
Lead Channel Setting Pacing Pulse Width: 0.4 ms
Lead Channel Setting Sensing Sensitivity: 4 mV
Zone Setting Status: 755011
Zone Setting Status: 755011

## 2022-08-21 LAB — POCT INR: INR: 2.5 (ref 2.0–3.0)

## 2022-08-21 NOTE — Patient Instructions (Signed)
Continue warfarin 1 tablet daily except 1/2 tablet on Sundays Recheck in 6 weeks 

## 2022-08-22 DIAGNOSIS — Z23 Encounter for immunization: Secondary | ICD-10-CM | POA: Diagnosis not present

## 2022-09-05 NOTE — Progress Notes (Signed)
Remote pacemaker transmission.   

## 2022-09-20 DIAGNOSIS — Z79899 Other long term (current) drug therapy: Secondary | ICD-10-CM | POA: Diagnosis not present

## 2022-09-20 DIAGNOSIS — I5032 Chronic diastolic (congestive) heart failure: Secondary | ICD-10-CM | POA: Diagnosis not present

## 2022-09-20 DIAGNOSIS — I4821 Permanent atrial fibrillation: Secondary | ICD-10-CM | POA: Diagnosis not present

## 2022-09-20 DIAGNOSIS — N400 Enlarged prostate without lower urinary tract symptoms: Secondary | ICD-10-CM | POA: Diagnosis not present

## 2022-09-20 DIAGNOSIS — I10 Essential (primary) hypertension: Secondary | ICD-10-CM | POA: Diagnosis not present

## 2022-09-27 DIAGNOSIS — K5900 Constipation, unspecified: Secondary | ICD-10-CM | POA: Diagnosis not present

## 2022-09-27 DIAGNOSIS — M1711 Unilateral primary osteoarthritis, right knee: Secondary | ICD-10-CM | POA: Diagnosis not present

## 2022-09-27 DIAGNOSIS — I5032 Chronic diastolic (congestive) heart failure: Secondary | ICD-10-CM | POA: Diagnosis not present

## 2022-10-02 ENCOUNTER — Ambulatory Visit: Payer: Medicare Other | Attending: Cardiology | Admitting: *Deleted

## 2022-10-02 DIAGNOSIS — Z5181 Encounter for therapeutic drug level monitoring: Secondary | ICD-10-CM | POA: Diagnosis not present

## 2022-10-02 DIAGNOSIS — Z952 Presence of prosthetic heart valve: Secondary | ICD-10-CM

## 2022-10-02 DIAGNOSIS — I480 Paroxysmal atrial fibrillation: Secondary | ICD-10-CM

## 2022-10-02 LAB — POCT INR: INR: 2.7 (ref 2.0–3.0)

## 2022-10-02 NOTE — Patient Instructions (Signed)
Continue warfarin 1 tablet daily except 1/2 tablet on Sundays Recheck in 6 weeks 

## 2022-10-31 ENCOUNTER — Encounter (HOSPITAL_COMMUNITY): Payer: Self-pay | Admitting: Oncology

## 2022-11-07 ENCOUNTER — Ambulatory Visit: Payer: Medicare Other | Attending: Cardiology | Admitting: Cardiology

## 2022-11-07 ENCOUNTER — Encounter (HOSPITAL_COMMUNITY): Payer: Self-pay | Admitting: Oncology

## 2022-11-07 ENCOUNTER — Encounter: Payer: Self-pay | Admitting: Cardiology

## 2022-11-07 VITALS — BP 138/60 | HR 70 | Ht 65.0 in | Wt 200.8 lb

## 2022-11-07 DIAGNOSIS — I25119 Atherosclerotic heart disease of native coronary artery with unspecified angina pectoris: Secondary | ICD-10-CM

## 2022-11-07 DIAGNOSIS — I442 Atrioventricular block, complete: Secondary | ICD-10-CM

## 2022-11-07 DIAGNOSIS — I4819 Other persistent atrial fibrillation: Secondary | ICD-10-CM

## 2022-11-07 NOTE — Progress Notes (Signed)
Cardiology Office Note  Date: 11/07/2022   ID: Whitman, Meinhardt [birth year], MRN [id number]  PCP:  [name], MD  Cardiologist:  Rozann Lesches, MD Electrophysiologist:  None   Chief Complaint  Patient presents with   Cardiac follow-up    History of Present Illness: Douglas [name] is a 89 y.o. male last seen in June 2023.  He is here for a routine visit.  Reports no angina or nitroglycerin use.  Remains functional with ADLs but does not have the same stamina over the last year.  Also limited by chronic right leg pain particular when he stands up.  Medtronic pacemaker in place with followed by Dr. Lovena Le.  Last device check indicated normal function.  He remains on Coumadin with follow-up in the anticoagulation clinic.  Last INR was 2.7.  He does not report any spontaneous bleeding problems.  I reviewed his medications and also most recent lab work.  His last echocardiogram was in 2019 at which point he is bioprosthetic aortic valve was functioning normally.  Past Medical History:  Diagnosis Date   Acute upper GI bleed 05/18/2016   Aneurysm of thoracic aorta (HCC)    Ascending. 5.7 x 5.5cm - Bentall procedure   Aortic stenosis    Bioprosthetic AVR   Arthritis    Atrial fibrillation ([place])    Documented on pacer interrogation 6/13   Complete heart block (HCC)    Status post pacemaker placement   Coronary atherosclerosis of native coronary artery    Multivessel - LIMA to LAD, SVG to OM, SVG to PDA   DJD (degenerative joint disease)    Esophageal dysmotility    Essential hypertension    Fibromyalgia    Gout    H/O arteriovenous malformation (AVM)    H/O hiatal hernia    Hemorrhoid    History of esophageal ulcer    History of skin cancer    Hx of adenomatous colonic polyps    Iron deficiency anemia due to chronic blood loss    Melena    Pacemaker-Medtronic    PMR (polymyalgia rheumatica) (HCC)    SIRS (systemic inflammatory response syndrome) ([place]) 01/16/2015     Current Outpatient Medications  Medication Sig Dispense Refill   acetaminophen (TYLENOL) 500 MG tablet Take 1,000 mg by mouth every 6 (six) hours as needed for mild pain.     allopurinol (ZYLOPRIM) 300 MG tablet Take 300 mg by mouth daily. Take 1  daily     amLODipine (NORVASC) 2.5 MG tablet      amoxicillin (AMOXIL) 500 MG capsule Take 500 mg by mouth. As need for dental work.     furosemide (LASIX) 40 MG tablet Take 20 mg by mouth every other day.     metoprolol succinate (TOPROL-XL) 25 MG 24 hr tablet TAKE (2) TABLETS BY MOUTH DAILY. 180 tablet 1   nitroGLYCERIN (NITROSTAT) 0.4 MG SL tablet Place 0.4 mg under the tongue every 5 (five) minutes x 3 doses as needed for chest pain (if no relief after 2nd dose, proceed to the ED for an evaluation or call 911).     pantoprazole (PROTONIX) 40 MG tablet TAKE ONE TABLET BY MOUTH ONCE DAILY IN THE MORNING. 30 tablet 6   pravastatin (PRAVACHOL) 20 MG tablet Take 20 mg by mouth every evening.   11   sennosides-docusate sodium (SENOKOT-S) 8.6-50 MG tablet Take 1 tablet by mouth 2 (two) times daily.     warfarin (COUMADIN) 5 MG tablet TAKE 1 TABLET  BY MOUTH ONCE DAILY EXCEPT 1/2 TABLET ON SUNDAYS OR AS DIRECTED. 90 tablet 3   No current facility-administered medications for this visit.   Allergies:  Imdur [isosorbide nitrate]   ROS: No palpitations.  No syncope.  Physical Exam: VS:  BP 138/60   Pulse 70   Ht '5\' 5"'$  (1.651 m)   Wt 200 lb 12.8 oz (91.1 kg)   SpO2 96%   BMI 33.41 kg/m , BMI Body mass index is 33.41 kg/m.  Wt Readings from Last 3 Encounters:  11/07/22 200 lb 12.8 oz (91.1 kg)  08/07/22 194 lb 9.6 oz (88.3 kg)  04/17/22 196 lb 3.2 oz (89 kg)    General: Patient appears comfortable at rest. HEENT: Conjunctiva and lids normal. Neck: Supple, no elevated JVP or carotid bruits. Lungs: Clear to auscultation, nonlabored breathing at rest. Cardiac: Regular rate and rhythm, no S3, 2/6 systolic murmur. Extremities: No pitting  edema.  ECG:  An ECG dated 04/17/2022 was personally reviewed today and demonstrated:  Ventricular paced rhythm.  Recent Labwork:  May 2023: Hemoglobin 11.8, platelets 176, cholesterol 125, triglycerides 75, HDL 40, LDL 70 November 2023: BUN 23, creatinine 1.15, potassium 4.2  Other Studies Reviewed Today:  Echocardiogram 09/22/2018: - Left ventricle: The cavity size was normal. Wall thickness was    increased in a pattern of mild LVH. Systolic function was normal.    The estimated ejection fraction was in the range of 55% to 60%.    There is mild hypokinesis of the apical anteroseptal myocardium,    possibly related to RV pacing. Indeterminate diastolic function.  - Aortic valve: History of Bental procedure with 23 mm Edwards    pericardial valve along with 7m Gelweave Valsalva ascending    aorta graft. Transvalvular velocity is in normal range.  - Mitral valve: Mildly calcified annulus. There was trivial    regurgitation.  - Left atrium: The atrium was mildly to moderately dilated.  - Right ventricle: Pacer wire or catheter noted in right ventricle.  - Right atrium: The atrium was at the upper limits of normal in    size.  - Atrial septum: No defect or patent foramen ovale was identified.  - Tricuspid valve: There was trivial regurgitation.  - Pulmonary arteries: PA peak pressure: 23 mm Hg (S).  - Pericardium, extracardiac: A prominent pericardial fat pad was    present.    Assessment and Plan:  1.  Persistent atrial fibrillation with CHA2DS2-VASc score of 4.  He does not report any palpitations and remains on Coumadin for stroke prophylaxis with follow-up in anticoagulation clinic.  2.  Complete heart block with Medtronic pacemaker in place and follow-up by Dr. TLovena Le  3.  Status post Bentall procedure with 23 mm Edwards pericardial AVR.  Prosthetic function stable by echocardiogram in 2019.  He is not on aspirin given use of Coumadin.  4.  CAD status post CABG in 2013.   Continue Toprol-XL, Norvasc, and Pravachol.  Last LDL was 70.  Medication Adjustments/Labs and Tests Ordered: Current medicines are reviewed at length with the patient today.  Concerns regarding medicines are outlined above.   Tests Ordered: No orders of the defined types were placed in this encounter.   Medication Changes: No orders of the defined types were placed in this encounter.   Disposition:  Follow up  6 months.  Signed, SSatira Sark MD, FMed[facility]1/17/2024 3:12 PM    [place] [id number]  Phone: [phone]; Fax: [phone]

## 2022-11-07 NOTE — Patient Instructions (Addendum)

## 2022-11-13 ENCOUNTER — Ambulatory Visit: Payer: Medicare Other | Attending: Cardiology | Admitting: *Deleted

## 2022-11-13 DIAGNOSIS — Z5181 Encounter for therapeutic drug level monitoring: Secondary | ICD-10-CM | POA: Diagnosis not present

## 2022-11-13 DIAGNOSIS — I480 Paroxysmal atrial fibrillation: Secondary | ICD-10-CM | POA: Diagnosis not present

## 2022-11-13 DIAGNOSIS — Z952 Presence of prosthetic heart valve: Secondary | ICD-10-CM

## 2022-11-13 DIAGNOSIS — L280 Lichen simplex chronicus: Secondary | ICD-10-CM | POA: Diagnosis not present

## 2022-11-13 DIAGNOSIS — L821 Other seborrheic keratosis: Secondary | ICD-10-CM | POA: Diagnosis not present

## 2022-11-13 DIAGNOSIS — L98499 Non-pressure chronic ulcer of skin of other sites with unspecified severity: Secondary | ICD-10-CM | POA: Diagnosis not present

## 2022-11-13 DIAGNOSIS — Z85828 Personal history of other malignant neoplasm of skin: Secondary | ICD-10-CM | POA: Diagnosis not present

## 2022-11-13 LAB — POCT INR: INR: 2.9 (ref 2.0–3.0)

## 2022-11-13 NOTE — Patient Instructions (Signed)
Continue warfarin 1 tablet daily except 1/2 tablet on Sundays Recheck in 6 weeks 

## 2022-11-20 ENCOUNTER — Ambulatory Visit: Payer: Medicare Other

## 2022-11-20 DIAGNOSIS — I442 Atrioventricular block, complete: Secondary | ICD-10-CM | POA: Diagnosis not present

## 2022-11-20 LAB — CUP PACEART REMOTE DEVICE CHECK
Battery Impedance: 2384 Ohm
Battery Remaining Longevity: 36 mo
Battery Voltage: 2.73 V
Brady Statistic RV Percent Paced: 100 %
Date Time Interrogation Session: 20240130083452
Implantable Lead Connection Status: 753985
Implantable Lead Connection Status: 753985
Implantable Lead Implant Date: 20130412
Implantable Lead Implant Date: 20130412
Implantable Lead Location: 753859
Implantable Lead Location: 753860
Implantable Lead Model: 5076
Implantable Lead Model: 5076
Implantable Pulse Generator Implant Date: 20130412
Lead Channel Impedance Value: 531 Ohm
Lead Channel Impedance Value: 67 Ohm
Lead Channel Pacing Threshold Amplitude: 0.625 V
Lead Channel Pacing Threshold Pulse Width: 0.4 ms
Lead Channel Setting Pacing Amplitude: 2 V
Lead Channel Setting Pacing Pulse Width: 0.4 ms
Lead Channel Setting Sensing Sensitivity: 4 mV
Zone Setting Status: 755011
Zone Setting Status: 755011

## 2022-11-27 ENCOUNTER — Other Ambulatory Visit: Payer: Self-pay | Admitting: Cardiology

## 2022-11-27 NOTE — Telephone Encounter (Signed)
Refill request for warfarin:  Last INR was 2.9 on 11/13/22 Next INR due 12/25/22 LOV was 11/07/25  Refill approved.

## 2022-12-12 DIAGNOSIS — R1310 Dysphagia, unspecified: Secondary | ICD-10-CM | POA: Diagnosis not present

## 2022-12-12 DIAGNOSIS — I5032 Chronic diastolic (congestive) heart failure: Secondary | ICD-10-CM | POA: Diagnosis not present

## 2022-12-12 DIAGNOSIS — R0609 Other forms of dyspnea: Secondary | ICD-10-CM | POA: Diagnosis not present

## 2022-12-13 ENCOUNTER — Other Ambulatory Visit (HOSPITAL_COMMUNITY): Payer: Self-pay | Admitting: Internal Medicine

## 2022-12-13 DIAGNOSIS — R1310 Dysphagia, unspecified: Secondary | ICD-10-CM

## 2022-12-18 NOTE — Progress Notes (Signed)
Remote pacemaker transmission.   

## 2022-12-21 ENCOUNTER — Ambulatory Visit (HOSPITAL_COMMUNITY)
Admission: RE | Admit: 2022-12-21 | Discharge: 2022-12-21 | Disposition: A | Discharge status: Home / Self Care | Payer: Medicare Other | Source: Ambulatory Visit | Attending: Internal Medicine | Admitting: Internal Medicine

## 2022-12-21 DIAGNOSIS — K224 Dyskinesia of esophagus: Secondary | ICD-10-CM | POA: Diagnosis not present

## 2022-12-21 DIAGNOSIS — R1310 Dysphagia, unspecified: Secondary | ICD-10-CM | POA: Diagnosis not present

## 2022-12-24 ENCOUNTER — Encounter (INDEPENDENT_AMBULATORY_CARE_PROVIDER_SITE_OTHER): Payer: Self-pay | Admitting: Gastroenterology

## 2022-12-24 ENCOUNTER — Ambulatory Visit (INDEPENDENT_AMBULATORY_CARE_PROVIDER_SITE_OTHER): Payer: Medicare Other | Admitting: Gastroenterology

## 2022-12-24 VITALS — BP 150/70 | HR 71 | Temp 97.8°F | Ht 65.0 in | Wt 205.1 lb

## 2022-12-24 DIAGNOSIS — R1110 Vomiting, unspecified: Secondary | ICD-10-CM

## 2022-12-24 DIAGNOSIS — R1312 Dysphagia, oropharyngeal phase: Secondary | ICD-10-CM | POA: Diagnosis not present

## 2022-12-24 DIAGNOSIS — R1310 Dysphagia, unspecified: Secondary | ICD-10-CM | POA: Insufficient documentation

## 2022-12-24 DIAGNOSIS — R633 Feeding difficulties, unspecified: Secondary | ICD-10-CM | POA: Diagnosis not present

## 2022-12-24 NOTE — Progress Notes (Unsigned)
Douglas [name], M.D. Gastroenterology & Hepatology [facility] [address] [place], [place] [address]  Primary Care Physician: [name], MD [address] [place] [address]  I will communicate my assessment and recommendations to the referring MD via EMR.  Problems: Oropharyngeal dysphagia  History of Present Illness: Douglas [name] is a 89 y.o. male PMH aortic stenosis and thoracic aortic aneurysm s/p Bental surgery, atrial fibrillation, coronary artery disease status post CABG, fibromyalgia, hypertension, gout, PMR, who presents for follow up of dysphagia and abnormal esophagram and dysphagia.   The patient was last seen on 04/06/2021. At that time, the patient was advised to take a colonoscopy prep followed by Linzess 145 mcg qday.  Patient reports that he has presented some occasional dysphagia for several months. He states that for the last month he presented recurrent episodes of dysphagia with solid food, but not with liquids. He states he feels "the food is getting caught in the upper throat". Sometimes he presents some burning in his mid chest, but not very frequently - currently taking pantoprazole.  Due to these complaints, the patient underwent an esophagram on 12/21/2022 that showed laryngeal penetration and aspiration of contrast with cough reflex, presence of residuals in vallecular and piriform sinuses and moderate age-related diffuse esophageal dysmotility.  Notably, the patient was not able to swallow the 12.5 mm tablet and he had to spit it from his mouth. He states he had to attempt swallowing the pill but could not make it go down his mouth.  The patient denies other complaints at the moment such as nausea, vomiting, fever, chills, hematochezia, melena, hematemesis, abdominal distention, abdominal pain, diarrhea, jaundice, pruritus or weight loss.  States his bowel movements have been regular, but only takes Linzess  as needed.  Last EGD: 04/2016 Normal esophagus, blood found in lesser curvature due to large AVM. Epinephrine injection and APC used  to achieve hemostasis. 3 clips placed, Normal duodenum.  Past Medical History: Past Medical History:  Diagnosis Date   Acute upper GI bleed 05/18/2016   Aneurysm of thoracic aorta (HCC)    Ascending. 5.7 x 5.5cm - Bentall procedure   Aortic stenosis    Bioprosthetic AVR   Arthritis    Atrial fibrillation ([place])    Documented on pacer interrogation 6/13   Complete heart block (HCC)    Status post pacemaker placement   Coronary atherosclerosis of native coronary artery    Multivessel - LIMA to LAD, SVG to OM, SVG to PDA   DJD (degenerative joint disease)    Esophageal dysmotility    Essential hypertension    Fibromyalgia    Gout    H/O arteriovenous malformation (AVM)    H/O hiatal hernia    Hemorrhoid    History of esophageal ulcer    History of skin cancer    Hx of adenomatous colonic polyps    Iron deficiency anemia due to chronic blood loss    Melena    Pacemaker-Medtronic    PMR (polymyalgia rheumatica) (HCC)    SIRS (systemic inflammatory response syndrome) ([place]) 01/16/2015    Past Surgical History: Past Surgical History:  Procedure Laterality Date   AORTIC VALVE REPLACEMENT  01/24/12   APPENDECTOMY     BACK SURGERY     1965 or 1966 lumbar back surgery   BENTALL PROCEDURE  01/24/2012   Procedure: BENTALL PROCEDURE;  Surgeon: Ivin Poot, MD;  Location: [facility];  Service: Open Heart Surgery;  Laterality: N/A;  BILATERAL KNEE ARTHROSCOPY     CATARACT EXTRACTION W/PHACO Left 12/03/2016   Procedure: CATARACT EXTRACTION PHACO AND INTRAOCULAR LENS PLACEMENT LEFT EYE;  Surgeon: Tonny Branch, MD;  Location: [facility];  Service: Ophthalmology;  Laterality: Left;  CDE:  10.87   CATARACT EXTRACTION W/PHACO Right 12/13/2016   Procedure: CATARACT EXTRACTION PHACO AND INTRAOCULAR LENS PLACEMENT (IOC);  Surgeon: Tonny Branch, MD;  Location: [facility];  Service:  Ophthalmology;  Laterality: Right;  right cde 11.78   CORONARY ARTERY BYPASS GRAFT  01/24/2012   Procedure: CORONARY ARTERY BYPASS GRAFTING (CABG);  Surgeon: Ivin Poot, MD;  Location: [facility];  Service: Open Heart Surgery;  Laterality: N/A;   ESOPHAGOGASTRODUODENOSCOPY  06/17/2012   Procedure: ESOPHAGOGASTRODUODENOSCOPY (EGD);  Surgeon: Rogene Houston, MD;  [facility];  Service: Endoscopy;  Laterality: N/A;  ED   ESOPHAGOGASTRODUODENOSCOPY N/A 05/19/2016   Procedure: ESOPHAGOGASTRODUODENOSCOPY (EGD);  Surgeon: Danie Binder, MD;  [facility];  Service: Endoscopy;  Laterality: N/A;   HEMORROIDECTOMY     HERNIA REPAIR     Left inguinal hernia repair     PACEMAKER PLACEMENT     Medtronic 4/13 - Dr. Rayann Heman   PERMANENT PACEMAKER INSERTION N/A 02/01/2012   Procedure: PERMANENT PACEMAKER INSERTION;  Surgeon: Thompson Grayer, MD; Medtronic Adapta L model ADDRL 1 (serial number [id number])      Family History: Family History  Problem Relation Age of Onset   Kidney disease Mother    Heart disease Father    Anesthesia problems Neg Hx    Hypotension Neg Hx    Malignant hyperthermia Neg Hx    Pseudochol deficiency Neg Hx     Social History: Social History   Tobacco Use  Smoking Status Never  Smokeless Tobacco Never   Social History   Substance and Sexual Activity  Alcohol Use No   Alcohol/week: 0.0 standard drinks of alcohol   Social History   Substance and Sexual Activity  Drug Use No    Allergies: Allergies  Allergen Reactions   Imdur [Isosorbide Nitrate] Other (See Comments)    Nausea and lightheaded     Medications: Current Outpatient Medications  Medication Sig Dispense Refill   acetaminophen (TYLENOL) 500 MG tablet Take 1,000 mg by mouth every 6 (six) hours as needed for mild pain.     allopurinol (ZYLOPRIM) 300 MG tablet Take 300 mg by mouth daily. Take 1  daily     amLODipine (NORVASC) 2.5 MG tablet Take 2.5 mg by mouth daily.      amoxicillin (AMOXIL) 500 MG capsule Take 500 mg by mouth. As need for dental work.     furosemide (LASIX) 40 MG tablet Take 20 mg by mouth every other day.     metoprolol succinate (TOPROL-XL) 25 MG 24 hr tablet TAKE (2) TABLETS BY MOUTH DAILY. 180 tablet 1   nitroGLYCERIN (NITROSTAT) 0.4 MG SL tablet Place 0.4 mg under the tongue every 5 (five) minutes x 3 doses as needed for chest pain (if no relief after 2nd dose, proceed to the ED for an evaluation or call 911).     pantoprazole (PROTONIX) 40 MG tablet TAKE ONE TABLET BY MOUTH ONCE DAILY IN THE MORNING. 30 tablet 6   pravastatin (PRAVACHOL) 20 MG tablet Take 20 mg by mouth every evening.   11   sennosides-docusate sodium (SENOKOT-S) 8.6-50 MG tablet Take 1 tablet by mouth 2 (two) times daily.     warfarin (COUMADIN) 5 MG tablet TAKE 1 TABLET BY MOUTH  ONCE DAILY OR AS DIRECTED. 90 tablet 1   No current facility-administered medications for this visit.    Review of Systems: GENERAL: negative for malaise, night sweats HEENT: No changes in hearing or vision, no nose bleeds or other nasal problems. NECK: Negative for lumps, goiter, pain and significant neck swelling RESPIRATORY: Negative for cough, wheezing CARDIOVASCULAR: Negative for chest pain, leg swelling, palpitations, orthopnea GI: SEE HPI MUSCULOSKELETAL: Negative for joint pain or swelling, back pain, and muscle pain. SKIN: Negative for lesions, rash PSYCH: Negative for sleep disturbance, mood disorder and recent psychosocial stressors. HEMATOLOGY Negative for prolonged bleeding, bruising easily, and swollen nodes. ENDOCRINE: Negative for cold or heat intolerance, polyuria, polydipsia and goiter. NEURO: negative for tremor, gait imbalance, syncope and seizures. The remainder of the review of systems is noncontributory.   Physical Exam: BP (!) 150/70 (BP Location: Left Arm, Patient Position: Sitting, Cuff Size: Large)   Pulse 71   Temp 97.8 F (36.6 C) (Temporal)   Ht '5\' 5"'$   (1.651 m)   Wt 205 lb 1.6 oz (93 kg)   BMI 34.13 kg/m  GENERAL: The patient is AO x3, in no acute distress. HEENT: Head is normocephalic and atraumatic. EOMI are intact. Mouth is well hydrated and without lesions. NECK: Supple. No masses LUNGS: Clear to auscultation. No presence of rhonchi/wheezing/rales. Adequate chest expansion HEART: RRR, normal s1 and s2. ABDOMEN: Soft, nontender, no guarding, no peritoneal signs, and nondistended. BS +. No masses. EXTREMITIES: Without any cyanosis, clubbing, rash, lesions or edema. NEUROLOGIC: AOx3, no focal motor deficit. SKIN: no jaundice, no rashes  Imaging/Labs: as above  I personally reviewed and interpreted the available labs, imaging and endoscopic files.  Impression and Plan: VEGAS TRINGALI is a 89 y.o. male PMH aortic stenosis and thoracic aortic aneurysm s/p Bental surgery, atrial fibrillation, coronary artery disease status post CABG, fibromyalgia, hypertension, gout, PMR, who presents for follow up of dysphagia and abnormal esophagram and dysphagia.  Patient has presented progressive dysphagia to solids.  Based on the findings on the most recent esophagram, it appears that he may have 2 components - one related to age-related dysmotility and a second component of oropharyngeal dysphagia, which is likely driving his symptoms.  I doubt that an endoscopic procedure will help guide history but an evaluation by speech and swallow with modified barium swallow may be more helpful.  Patient is agreement to proceed with this.  The patient was found to have elevated blood pressure when vital signs were checked in the office. The blood pressure was rechecked by the nursing staff and it was found be persistently elevated >140/90 mmHg. I personally advised to the patient to follow up closely with PCP for hypertension control.  - Schedule modified barium swallow with speech and swallow evaluation  All questions were answered.      Douglas Peppers,  MD Gastroenterology and Hepatology [facility]

## 2022-12-24 NOTE — Patient Instructions (Addendum)
Schedule modified barium swallow with speech and swallow evaluation The patient was found to have elevated blood pressure when vital signs were checked in the office. The blood pressure was rechecked by the nursing staff and it was found be persistently elevated >140/90 mmHg. I personally advised to the patient to follow up closely with PCP for hypertension control.

## 2022-12-25 ENCOUNTER — Other Ambulatory Visit (HOSPITAL_COMMUNITY): Payer: Self-pay | Admitting: Occupational Therapy

## 2022-12-25 ENCOUNTER — Ambulatory Visit: Payer: Medicare Other | Attending: Cardiology | Admitting: *Deleted

## 2022-12-25 DIAGNOSIS — I480 Paroxysmal atrial fibrillation: Secondary | ICD-10-CM | POA: Diagnosis not present

## 2022-12-25 DIAGNOSIS — R1312 Dysphagia, oropharyngeal phase: Secondary | ICD-10-CM

## 2022-12-25 DIAGNOSIS — Z952 Presence of prosthetic heart valve: Secondary | ICD-10-CM | POA: Diagnosis not present

## 2022-12-25 DIAGNOSIS — Z5181 Encounter for therapeutic drug level monitoring: Secondary | ICD-10-CM

## 2022-12-25 DIAGNOSIS — R633 Feeding difficulties, unspecified: Secondary | ICD-10-CM

## 2022-12-25 LAB — POCT INR: INR: 2.6 (ref 2.0–3.0)

## 2022-12-25 NOTE — Patient Instructions (Signed)
Continue warfarin 1 tablet daily except 1/2 tablet on Sundays Recheck in 6 weeks

## 2023-01-01 ENCOUNTER — Ambulatory Visit (HOSPITAL_COMMUNITY): Payer: Medicare Other | Attending: Gastroenterology | Admitting: Speech Pathology

## 2023-01-01 ENCOUNTER — Encounter (HOSPITAL_COMMUNITY): Payer: Self-pay | Admitting: Speech Pathology

## 2023-01-01 ENCOUNTER — Ambulatory Visit (HOSPITAL_COMMUNITY)
Admission: RE | Admit: 2023-01-01 | Discharge: 2023-01-01 | Disposition: A | Discharge status: Home / Self Care | Payer: Medicare Other | Source: Ambulatory Visit | Attending: Gastroenterology | Admitting: Gastroenterology

## 2023-01-01 DIAGNOSIS — R633 Feeding difficulties, unspecified: Secondary | ICD-10-CM | POA: Diagnosis not present

## 2023-01-01 DIAGNOSIS — R1312 Dysphagia, oropharyngeal phase: Secondary | ICD-10-CM | POA: Insufficient documentation

## 2023-01-01 DIAGNOSIS — T17320A Food in larynx causing asphyxiation, initial encounter: Secondary | ICD-10-CM | POA: Diagnosis not present

## 2023-01-01 NOTE — Therapy (Signed)
[facility] at [place], [place], [address] Phone: [phone]   Fax:  [phone]  Modified Barium Swallow  Patient Details  Name: Douglas [name] MRN: [id number] Date of Birth: 02-25-1928 No data recorded  Encounter Date: 01/01/2023   End of Session - 01/01/23 1731     Visit Number 1    Number of Visits 1    Authorization Type Medicare    SLP Start Time [id number]    SLP Stop Time  1410    SLP Time Calculation (min) 25 min    Activity Tolerance Patient tolerated treatment well             Past Medical History:  Diagnosis Date   Acute upper GI bleed 05/18/2016   Aneurysm of thoracic aorta (HCC)    Ascending. 5.7 x 5.5cm - Bentall procedure   Aortic stenosis    Bioprosthetic AVR   Arthritis    Atrial fibrillation ([place])    Documented on pacer interrogation 6/13   Complete heart block (HCC)    Status post pacemaker placement   Coronary atherosclerosis of native coronary artery    Multivessel - LIMA to LAD, SVG to OM, SVG to PDA   DJD (degenerative joint disease)    Esophageal dysmotility    Essential hypertension    Fibromyalgia    Gout    H/O arteriovenous malformation (AVM)    H/O hiatal hernia    Hemorrhoid    History of esophageal ulcer    History of skin cancer    Hx of adenomatous colonic polyps    Iron deficiency anemia due to chronic blood loss    Melena    Pacemaker-Medtronic    PMR (polymyalgia rheumatica) (HCC)    SIRS (systemic inflammatory response syndrome) ([place]) 01/16/2015    Past Surgical History:  Procedure Laterality Date   AORTIC VALVE REPLACEMENT  01/24/12   APPENDECTOMY     BACK SURGERY     1965 or 1966 lumbar back surgery   BENTALL PROCEDURE  01/24/2012   Procedure: BENTALL PROCEDURE;  Surgeon: Ivin Poot, MD;  Location: [facility];  Service: Open Heart Surgery;  Laterality: N/A;   BILATERAL KNEE ARTHROSCOPY     CATARACT EXTRACTION W/PHACO Left 12/03/2016   Procedure: CATARACT  EXTRACTION PHACO AND INTRAOCULAR LENS PLACEMENT LEFT EYE;  Surgeon: Tonny Branch, MD;  Location: [facility];  Service: Ophthalmology;  Laterality: Left;  CDE:  10.87   CATARACT EXTRACTION W/PHACO Right 12/13/2016   Procedure: CATARACT EXTRACTION PHACO AND INTRAOCULAR LENS PLACEMENT (IOC);  Surgeon: Tonny Branch, MD;  Location: [facility];  Service: Ophthalmology;  Laterality: Right;  right cde 11.78   CORONARY ARTERY BYPASS GRAFT  01/24/2012   Procedure: CORONARY ARTERY BYPASS GRAFTING (CABG);  Surgeon: Ivin Poot, MD;  Location: [facility];  Service: Open Heart Surgery;  Laterality: N/A;   ESOPHAGOGASTRODUODENOSCOPY  06/17/2012   Procedure: ESOPHAGOGASTRODUODENOSCOPY (EGD);  Surgeon: Rogene Houston, MD;  [facility];  Service: Endoscopy;  Laterality: N/A;  ED   ESOPHAGOGASTRODUODENOSCOPY N/A 05/19/2016   Procedure: ESOPHAGOGASTRODUODENOSCOPY (EGD);  Surgeon: Danie Binder, MD;  [facility];  Service: Endoscopy;  Laterality: N/A;   HEMORROIDECTOMY     HERNIA REPAIR     Left inguinal hernia repair     PACEMAKER PLACEMENT     Medtronic 4/13 - Dr. Rayann Heman   PERMANENT PACEMAKER INSERTION N/A 02/01/2012   Procedure: PERMANENT PACEMAKER INSERTION;  Surgeon: Thompson Grayer, MD; Medtronic Adapta  L model ADDRL 1 (serial number [id number])      There were no vitals filed for this visit.        General - 01/01/23 1721       General Information   Date of Onset 12/24/22    HPI JUSTINLEE PITRE is a 89 yo male who was referred for MBSS by Dr. Maylon Peppers due to reports of dysphagia. Pt had a barium swallow 12/21/22 which demonstrated aspiration of thin which elicited a cough. He had MBSS in 2013 (D3/NTL). He consumes regular textures and thin liquids, but reports difficulty swallowing solid foods at times.    Type of Study MBS-Modified Barium Swallow Study    Previous Swallow Assessment MBSS 02/05/2012 D3/NTL    Diet Prior to this Study Regular;Thin liquids (Level 0)    Temperature Spikes  Noted No    Respiratory Status Room air    History of Recent Intubation No    Behavior/Cognition Alert;Cooperative;Pleasant mood    Oral Cavity: Oral Hygiene Within Functional Limits    Oral Care Completed by SLP No    Oral Cavity - Dentition Adequate natural dentition    Vision Functional for self feeding    Self-Feeding Abilities Able to feed self    Patient Positioning Upright in chair    Baseline Vocal Quality Normal    Volitional Cough Strong    Volitional Swallow Able to elicit    Anatomy Within functional limits    Pharyngeal Secretions Not observed secondary MBS                Oral Preparation/Oral Phase - 01/01/23 1724       Oral Preparation/Oral Phase   Oral Phase Within functional limits      Electrical stimulation - Oral Phase   Was Electrical Stimulation Used No              Pharyngeal Phase - 01/01/23 1724       Pharyngeal Phase   Pharyngeal Phase Impaired      Pharyngeal - Honey   Pharyngeal- Honey Teaspoon Swallow initiation at vallecula;Reduced epiglottic inversion;Reduced tongue base retraction;Pharyngeal residue - valleculae;Pharyngeal residue - pyriform      Pharyngeal - Nectar   Pharyngeal- Nectar Teaspoon Reduced epiglottic inversion;Reduced tongue base retraction;Pharyngeal residue - valleculae;Pharyngeal residue - pyriform    Pharyngeal- Nectar Cup Reduced epiglottic inversion;Pharyngeal residue - valleculae;Pharyngeal residue - pyriform;Reduced tongue base retraction      Pharyngeal - Thin   Pharyngeal- Thin Teaspoon Reduced epiglottic inversion;Reduced tongue base retraction;Pharyngeal residue - valleculae;Pharyngeal residue - pyriform    Pharyngeal- Thin Cup Reduced epiglottic inversion;Pharyngeal residue - valleculae;Pharyngeal residue - pyriform;Reduced tongue base retraction    Pharyngeal- Thin Straw Reduced epiglottic inversion;Penetration/Apiration after swallow;Reduced tongue base retraction;Pharyngeal residue -  valleculae;Reduced airway/laryngeal closure    Pharyngeal Material enters airway, CONTACTS cords and then ejected out      Pharyngeal - Solids   Pharyngeal- Puree Swallow initiation at vallecula;Reduced epiglottic inversion;Reduced tongue base retraction;Pharyngeal residue - valleculae;Pharyngeal residue - pyriform;Compensatory strategies attempted (with notebox)   effortful swallow and repeat swallows were effective   Pharyngeal- Regular Reduced tongue base retraction;Pharyngeal residue - valleculae;Pharyngeal residue - pyriform    Pharyngeal- Pill Pharyngeal residue - valleculae      Electrical Stimulation - Pharyngeal Phase   Was Electrical Stimulation Used No              Cricopharyngeal Phase - 01/01/23 1728       Cervical Esophageal Phase  Cervical Esophageal Phase Impaired      Cervical Esophageal Phase - Solids   Puree Esophageal backflow into cervical esophagus;Reduced cricopharyngeal relaxation      Cervical Esophageal Phase - Comment   Cervical Esophageal Comment retrograde movement of barium in esophagus to the level of the cervical esophagus    Other Esophageal Phase Observations diffuse dysmotility as confirmed by radiologist                      Plan - 01/01/23 1731     Clinical Impression Statement Pt presents with mi/mod pharyngeal phase dysphagia characterized by tongue base weakness with reduced tongue base retraction and epiglottic deflection resulting in min vallecular residue across consistencies and textures and reduced opening of UES with resulting pyriform residue post initial swallow. A repeat/dry swallow was effective in reducing pharyngeal residue, as was implementation of effortful/hard swallow while swallowing puree. Pt with a single episode of trace penetration of thins via straw when taking sequential swallows in attempt to clear the barium tablet from the valleculae. SLP cued the Pt to clear his throat and it was removed. Pt was unable  to swallow the barium tablet and had to exectorate it. He reports that he swallows his medications whole with peanut butter at home without incident. Esophageal sweep revealed barium stasis and retrograde movement  to the level of the cervical esophagus (esophagus never completey emptied) suggestive of dysmotility as confirmed by radiologist. The study and recommendations were reviewed with Pt and Pt was provided written information. He was encouraged to implement "effortful" "swallow hard" swallow withe solids and alternate solids/liquids, and swallow 2x for each bite/sip, consume smaller, frequent meals and sit upright for at least 30 minutes post meals for reflux precautions. Pt was offered dysphagia therapy for pharyngeal strengthening exercises, however he reports that he will implement the above strategies and call SLP if he decides he wants to pursue therapy. OK for self regulated regular textures and thin liquids, however Pt was encouraged to chop up meats and/or masticate thoroughly. No further SLP services indicated at this time.    Treatment/Interventions Patient/family education;Compensatory strategies    Potential to Achieve Goals Good    Consulted and Agree with Plan of Care Patient             Patient will benefit from skilled therapeutic intervention in order to improve the following deficits and impairments:   Dysphagia, oropharyngeal phase     Recommendations/Treatment - 01/01/23 1729       Swallow Evaluation Recommendations   SLP Diet Recommendations Age appropriate regular;Thin    Liquid Administration via Cup;Straw    Medication Administration Whole meds with puree    Supervision Patient able to self feed    Compensations Multiple dry swallows after each bite/sip;Effortful swallow    Postural Changes Seated upright at 90 degrees;Remain upright for at least 30 minutes after feeds/meals              Prognosis - 01/01/23 1730       Prognosis   Prognosis for  improved oropharyngeal function Fair    Barriers to Reach Goals Other (Comment)   esophageal dysmotility impact on pharyngeal swallow     Individuals Consulted   Consulted and Agree with Results and Recommendations Patient    Report Sent to  Referring physician             Problem List Patient Active Problem List   Diagnosis Date Noted   Oropharyngeal dysphagia 12/24/2022  Regurgitation of food 12/24/2022   Hemorrhoid 12/11/2017   Constipation 12/11/2017   Iron deficiency anemia due to chronic blood loss 10/03/2016   Acute upper GI bleed 05/18/2016   Melena    Symptomatic anemia    Hypotension    History of esophageal ulcer    H/O arteriovenous malformation (AVM)    Chronic anticoagulation - Coumadin, CHADS2VASC=5    UTI (urinary tract infection) 01/16/2015   SIRS (systemic inflammatory response syndrome) ([place]) 01/16/2015   Precordial pain 06/15/2014   Aortic valve replaced 08/25/2012   Pacemaker-Medtronic 07/29/2012   Atrial fibrillation ([place]) 02/11/2012   CHB (complete heart block) ([place]) 01/31/2012   Aneurysm of thoracic aorta (HCC)    Coronary atherosclerosis of native coronary artery 05/10/2011   Essential hypertension, benign    Aortic stenosis    Thank you,  Genene Churn, [place]  [place], [place] 01/01/2023, 5:41 PM  [facility] [place], [place], [address] Phone: [phone]   Fax:  [phone]  Name: ABSALOM MACKLEY MRN: [id number] Date of Birth: August 25, 1928

## 2023-01-23 ENCOUNTER — Other Ambulatory Visit: Payer: Self-pay | Admitting: Cardiology

## 2023-01-23 DIAGNOSIS — M170 Bilateral primary osteoarthritis of knee: Secondary | ICD-10-CM | POA: Diagnosis not present

## 2023-02-05 ENCOUNTER — Ambulatory Visit: Payer: Medicare Other | Attending: Cardiology | Admitting: *Deleted

## 2023-02-05 DIAGNOSIS — I480 Paroxysmal atrial fibrillation: Secondary | ICD-10-CM

## 2023-02-05 DIAGNOSIS — Z5181 Encounter for therapeutic drug level monitoring: Secondary | ICD-10-CM

## 2023-02-05 DIAGNOSIS — Z952 Presence of prosthetic heart valve: Secondary | ICD-10-CM

## 2023-02-05 LAB — POCT INR: INR: 1.8 — AB (ref 2.0–3.0)

## 2023-02-05 NOTE — Patient Instructions (Signed)
Take warfarin 1 1/2 tablets tonight then resume 1 tablet daily except 1/2 tablet on Sundays Recheck in 4 weeks

## 2023-02-06 DIAGNOSIS — I5032 Chronic diastolic (congestive) heart failure: Secondary | ICD-10-CM | POA: Diagnosis not present

## 2023-02-06 DIAGNOSIS — D509 Iron deficiency anemia, unspecified: Secondary | ICD-10-CM | POA: Diagnosis not present

## 2023-02-06 DIAGNOSIS — R1310 Dysphagia, unspecified: Secondary | ICD-10-CM | POA: Diagnosis not present

## 2023-02-06 DIAGNOSIS — I10 Essential (primary) hypertension: Secondary | ICD-10-CM | POA: Diagnosis not present

## 2023-02-19 ENCOUNTER — Ambulatory Visit (INDEPENDENT_AMBULATORY_CARE_PROVIDER_SITE_OTHER): Payer: Medicare Other

## 2023-02-19 DIAGNOSIS — I442 Atrioventricular block, complete: Secondary | ICD-10-CM

## 2023-02-20 LAB — CUP PACEART REMOTE DEVICE CHECK
Battery Impedance: 2479 Ohm
Battery Remaining Longevity: 35 mo
Battery Voltage: 2.74 V
Brady Statistic RV Percent Paced: 100 %
Date Time Interrogation Session: 20240501080036
Implantable Lead Connection Status: 753985
Implantable Lead Connection Status: 753985
Implantable Lead Implant Date: 20130412
Implantable Lead Implant Date: 20130412
Implantable Lead Location: 753859
Implantable Lead Location: 753860
Implantable Lead Model: 5076
Implantable Lead Model: 5076
Implantable Pulse Generator Implant Date: 20130412
Lead Channel Impedance Value: 527 Ohm
Lead Channel Impedance Value: 67 Ohm
Lead Channel Pacing Threshold Amplitude: 0.625 V
Lead Channel Pacing Threshold Pulse Width: 0.4 ms
Lead Channel Setting Pacing Amplitude: 2 V
Lead Channel Setting Pacing Pulse Width: 0.4 ms
Lead Channel Setting Sensing Sensitivity: 4 mV
Zone Setting Status: 755011
Zone Setting Status: 755011

## 2023-03-05 ENCOUNTER — Ambulatory Visit: Payer: Medicare Other | Attending: Cardiology | Admitting: *Deleted

## 2023-03-05 DIAGNOSIS — I480 Paroxysmal atrial fibrillation: Secondary | ICD-10-CM | POA: Diagnosis not present

## 2023-03-05 DIAGNOSIS — Z952 Presence of prosthetic heart valve: Secondary | ICD-10-CM | POA: Diagnosis not present

## 2023-03-05 DIAGNOSIS — Z5181 Encounter for therapeutic drug level monitoring: Secondary | ICD-10-CM

## 2023-03-05 LAB — POCT INR: INR: 2.4 (ref 2.0–3.0)

## 2023-03-05 NOTE — Patient Instructions (Signed)
Continue warfarin 1 tablet daily except 1/2 tablet on Sundays  ?Recheck in 4 weeks  ?

## 2023-03-13 NOTE — Progress Notes (Signed)
Remote pacemaker transmission.   

## 2023-03-28 DIAGNOSIS — M1990 Unspecified osteoarthritis, unspecified site: Secondary | ICD-10-CM | POA: Diagnosis not present

## 2023-03-28 DIAGNOSIS — D500 Iron deficiency anemia secondary to blood loss (chronic): Secondary | ICD-10-CM | POA: Diagnosis not present

## 2023-03-28 DIAGNOSIS — M1000 Idiopathic gout, unspecified site: Secondary | ICD-10-CM | POA: Diagnosis not present

## 2023-03-28 DIAGNOSIS — N400 Enlarged prostate without lower urinary tract symptoms: Secondary | ICD-10-CM | POA: Diagnosis not present

## 2023-03-28 DIAGNOSIS — Z79899 Other long term (current) drug therapy: Secondary | ICD-10-CM | POA: Diagnosis not present

## 2023-03-28 DIAGNOSIS — I10 Essential (primary) hypertension: Secondary | ICD-10-CM | POA: Diagnosis not present

## 2023-03-28 DIAGNOSIS — I4819 Other persistent atrial fibrillation: Secondary | ICD-10-CM | POA: Diagnosis not present

## 2023-03-28 DIAGNOSIS — I5032 Chronic diastolic (congestive) heart failure: Secondary | ICD-10-CM | POA: Diagnosis not present

## 2023-03-28 DIAGNOSIS — I442 Atrioventricular block, complete: Secondary | ICD-10-CM | POA: Diagnosis not present

## 2023-03-28 DIAGNOSIS — R7301 Impaired fasting glucose: Secondary | ICD-10-CM | POA: Diagnosis not present

## 2023-03-28 DIAGNOSIS — E785 Hyperlipidemia, unspecified: Secondary | ICD-10-CM | POA: Diagnosis not present

## 2023-04-02 ENCOUNTER — Ambulatory Visit: Payer: Medicare Other | Attending: Cardiology | Admitting: *Deleted

## 2023-04-02 DIAGNOSIS — Z952 Presence of prosthetic heart valve: Secondary | ICD-10-CM | POA: Diagnosis not present

## 2023-04-02 DIAGNOSIS — I480 Paroxysmal atrial fibrillation: Secondary | ICD-10-CM | POA: Insufficient documentation

## 2023-04-02 DIAGNOSIS — Z5181 Encounter for therapeutic drug level monitoring: Secondary | ICD-10-CM | POA: Diagnosis not present

## 2023-04-02 LAB — POCT INR: INR: 3.7 — AB (ref 2.0–3.0)

## 2023-04-02 NOTE — Patient Instructions (Signed)
Hold warfarin tonight then resume 1 tablet daily except 1/2 tablet on Sundays Recheck in 3 weeks Eat extra greens today

## 2023-04-04 DIAGNOSIS — D6869 Other thrombophilia: Secondary | ICD-10-CM | POA: Diagnosis not present

## 2023-04-04 DIAGNOSIS — M109 Gout, unspecified: Secondary | ICD-10-CM | POA: Diagnosis not present

## 2023-04-04 DIAGNOSIS — I10 Essential (primary) hypertension: Secondary | ICD-10-CM | POA: Diagnosis not present

## 2023-04-04 DIAGNOSIS — I4821 Permanent atrial fibrillation: Secondary | ICD-10-CM | POA: Diagnosis not present

## 2023-04-04 DIAGNOSIS — I5032 Chronic diastolic (congestive) heart failure: Secondary | ICD-10-CM | POA: Diagnosis not present

## 2023-04-04 DIAGNOSIS — D649 Anemia, unspecified: Secondary | ICD-10-CM | POA: Diagnosis not present

## 2023-04-04 DIAGNOSIS — E785 Hyperlipidemia, unspecified: Secondary | ICD-10-CM | POA: Diagnosis not present

## 2023-04-22 ENCOUNTER — Encounter (HOSPITAL_COMMUNITY): Payer: Self-pay | Admitting: Oncology

## 2023-04-23 DIAGNOSIS — L570 Actinic keratosis: Secondary | ICD-10-CM | POA: Diagnosis not present

## 2023-04-23 DIAGNOSIS — D3617 Benign neoplasm of peripheral nerves and autonomic nervous system of trunk, unspecified: Secondary | ICD-10-CM | POA: Diagnosis not present

## 2023-04-23 DIAGNOSIS — L821 Other seborrheic keratosis: Secondary | ICD-10-CM | POA: Diagnosis not present

## 2023-04-23 DIAGNOSIS — D485 Neoplasm of uncertain behavior of skin: Secondary | ICD-10-CM | POA: Diagnosis not present

## 2023-04-23 DIAGNOSIS — Z85828 Personal history of other malignant neoplasm of skin: Secondary | ICD-10-CM | POA: Diagnosis not present

## 2023-04-23 DIAGNOSIS — L814 Other melanin hyperpigmentation: Secondary | ICD-10-CM | POA: Diagnosis not present

## 2023-04-29 ENCOUNTER — Ambulatory Visit: Payer: Medicare Other | Attending: Cardiology | Admitting: *Deleted

## 2023-04-29 DIAGNOSIS — Z5181 Encounter for therapeutic drug level monitoring: Secondary | ICD-10-CM | POA: Insufficient documentation

## 2023-04-29 DIAGNOSIS — Z952 Presence of prosthetic heart valve: Secondary | ICD-10-CM | POA: Insufficient documentation

## 2023-04-29 DIAGNOSIS — I480 Paroxysmal atrial fibrillation: Secondary | ICD-10-CM | POA: Diagnosis not present

## 2023-04-29 LAB — POCT INR: INR: 2.9 (ref 2.0–3.0)

## 2023-04-29 NOTE — Patient Instructions (Signed)
Continue warfarin 1 tablet daily except 1/2 tablet on Sundays Recheck in 4 weeks Continue greens 

## 2023-05-10 ENCOUNTER — Ambulatory Visit: Payer: Medicare Other | Attending: Cardiology | Admitting: Cardiology

## 2023-05-10 ENCOUNTER — Encounter: Payer: Self-pay | Admitting: Cardiology

## 2023-05-10 ENCOUNTER — Other Ambulatory Visit: Payer: Self-pay

## 2023-05-10 VITALS — BP 130/68 | HR 74 | Ht 65.0 in | Wt 190.2 lb

## 2023-05-10 DIAGNOSIS — I480 Paroxysmal atrial fibrillation: Secondary | ICD-10-CM | POA: Insufficient documentation

## 2023-05-10 DIAGNOSIS — Z953 Presence of xenogenic heart valve: Secondary | ICD-10-CM | POA: Insufficient documentation

## 2023-05-10 DIAGNOSIS — I25119 Atherosclerotic heart disease of native coronary artery with unspecified angina pectoris: Secondary | ICD-10-CM | POA: Insufficient documentation

## 2023-05-10 DIAGNOSIS — I442 Atrioventricular block, complete: Secondary | ICD-10-CM | POA: Diagnosis not present

## 2023-05-10 NOTE — Progress Notes (Signed)
    Cardiology Office Note  Date: 05/10/2023   ID: Douglas [name], DOB 1928/07/07, MRN [id number]  History of Present Illness: Douglas [name] is a 89 y.o. male last seen in January.  He is here for a routine visit.  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  He still goes out into his garden for 30 to 45 minutes in the mornings.  No angina or palpitations.  Medtronic pacemaker in place with follow-up by Dr. Ladona Ridgel.  Device check in April revealed normal function with brief episodes of NSVT and AF.  He remains on Coumadin with follow-up in the anticoagulation clinic, recent INR 2.9.  He does not report any falls or syncope.  No spontaneous bleeding problems.  His last echocardiogram was in 2019, we are following him conservatively at this point.  ECG today shows a ventricular paced rhythm.  Physical Exam: VS:  BP 130/68 (BP Location: Right Arm, Patient Position: Sitting, Cuff Size: Normal)   Pulse 74   Ht 5\' 5"  (1.651 m)   Wt 190 lb 3.2 oz (86.3 kg)   SpO2 98%   BMI 31.65 kg/m , BMI Body mass index is 31.65 kg/m.  Wt Readings from Last 3 Encounters:  05/10/23 190 lb 3.2 oz (86.3 kg)  12/24/22 205 lb 1.6 oz (93 kg)  11/07/22 200 lb 12.8 oz (91.1 kg)    General: Patient appears comfortable at rest. HEENT: Conjunctiva and lids normal, oropharynx clear with moist mucosa. Neck: Supple, no elevated JVP or carotid bruits, no thyromegaly. Lungs: Clear to auscultation, nonlabored breathing at rest. Cardiac: Regular rate and rhythm, no S3 or significant systolic murmur, no pericardial rub. Abdomen: Soft, nontender, no hepatomegaly, bowel sounds present, no guarding or rebound. Extremities: No pitting edema, distal pulses 2+. Skin: Warm and dry. Musculoskeletal: No kyphosis. Neuropsychiatric: Alert and oriented x3, affect grossly appropriate.  ECG:  An ECG dated 04/17/2022 was personally reviewed today and  demonstrated:  Regular pacing.  Labwork:  June 2024: Hemoglobin 12.2, platelets 200, BUN 20, creatinine 1.06, potassium 5, AST 25, ALT 18, cholesterol 133, triglycerides 81, HDL 34, LDL 83  Other Studies Reviewed Today:  No interval cardiac testing for review today.  Assessment and Plan:  1.  Persistent atrial fibrillation with CHA2DS2-VASc score of 4.  Remains on Coumadin with follow-up in the anticoagulation clinic.  He does not report any palpitations at this time.  2.  Complete heart block status post Medtronic pacemaker with follow-up by Dr. Ladona Ridgel.  3.  Status post Bentall procedure with 23 mm Edwards pericardial valve along with 28mm Gelweave Valsalva ascending aorta graft in 2013 at the time of CABG.  Echocardiogram in 2019 revealed LVEF 55 to 60%, stable AVR with no aortic regurgitation.  He is not on aspirin given use of Coumadin.  4.  Multivessel CAD status post CABG with LIMA to LAD, SVG to OM, and SVG to PDA in 2013.  No active angina at this time.  He is not on aspirin given use of Coumadin.  Continue Toprol-XL and Pravachol.  5.  Essential hypertension, systolic in the 130s.  Continue Norvasc in addition to the above regimen.  Disposition:  Follow up  6 months.  Signed, Jonelle Sidle, M.D., F.A.C.C. [facility] at [facility]

## 2023-05-10 NOTE — Patient Instructions (Signed)
Medication Instructions:  Your physician recommends that you continue on your current medications as directed. Please refer to the Current Medication list given to you today.   Labwork: None today  Testing/Procedures: None today  Follow-Up: 6 months  Any Other Special Instructions Will Be Listed Below (If Applicable).  If you need a refill on your cardiac medications before your next appointment, please call your pharmacy.  

## 2023-05-21 ENCOUNTER — Ambulatory Visit: Payer: Medicare Other

## 2023-05-21 DIAGNOSIS — I442 Atrioventricular block, complete: Secondary | ICD-10-CM

## 2023-05-27 ENCOUNTER — Ambulatory Visit: Payer: Medicare Other | Attending: Cardiology | Admitting: *Deleted

## 2023-05-27 DIAGNOSIS — Z952 Presence of prosthetic heart valve: Secondary | ICD-10-CM | POA: Diagnosis not present

## 2023-05-27 DIAGNOSIS — I480 Paroxysmal atrial fibrillation: Secondary | ICD-10-CM | POA: Diagnosis not present

## 2023-05-27 DIAGNOSIS — Z5181 Encounter for therapeutic drug level monitoring: Secondary | ICD-10-CM | POA: Insufficient documentation

## 2023-05-27 LAB — POCT INR: INR: 2.1 (ref 2.0–3.0)

## 2023-05-27 NOTE — Patient Instructions (Signed)
Continue warfarin 1 tablet daily except 1/2 tablet on Sundays Recheck in 6 weeks Continue greens

## 2023-05-30 ENCOUNTER — Other Ambulatory Visit: Payer: Self-pay | Admitting: Cardiology

## 2023-06-12 NOTE — Progress Notes (Signed)
Remote pacemaker transmission.   

## 2023-06-18 DIAGNOSIS — H0102A Squamous blepharitis right eye, upper and lower eyelids: Secondary | ICD-10-CM | POA: Diagnosis not present

## 2023-06-18 DIAGNOSIS — H0102B Squamous blepharitis left eye, upper and lower eyelids: Secondary | ICD-10-CM | POA: Diagnosis not present

## 2023-06-18 DIAGNOSIS — Z961 Presence of intraocular lens: Secondary | ICD-10-CM | POA: Diagnosis not present

## 2023-06-18 DIAGNOSIS — D492 Neoplasm of unspecified behavior of bone, soft tissue, and skin: Secondary | ICD-10-CM | POA: Diagnosis not present

## 2023-06-18 DIAGNOSIS — H26493 Other secondary cataract, bilateral: Secondary | ICD-10-CM | POA: Diagnosis not present

## 2023-06-18 DIAGNOSIS — H04123 Dry eye syndrome of bilateral lacrimal glands: Secondary | ICD-10-CM | POA: Diagnosis not present

## 2023-06-18 DIAGNOSIS — H43812 Vitreous degeneration, left eye: Secondary | ICD-10-CM | POA: Diagnosis not present

## 2023-07-01 DIAGNOSIS — I2510 Atherosclerotic heart disease of native coronary artery without angina pectoris: Secondary | ICD-10-CM | POA: Diagnosis not present

## 2023-07-01 DIAGNOSIS — I442 Atrioventricular block, complete: Secondary | ICD-10-CM | POA: Diagnosis not present

## 2023-07-01 DIAGNOSIS — I5032 Chronic diastolic (congestive) heart failure: Secondary | ICD-10-CM | POA: Diagnosis not present

## 2023-07-01 DIAGNOSIS — D631 Anemia in chronic kidney disease: Secondary | ICD-10-CM | POA: Diagnosis not present

## 2023-07-01 DIAGNOSIS — I4819 Other persistent atrial fibrillation: Secondary | ICD-10-CM | POA: Diagnosis not present

## 2023-07-02 DIAGNOSIS — H26492 Other secondary cataract, left eye: Secondary | ICD-10-CM | POA: Diagnosis not present

## 2023-07-04 DIAGNOSIS — Z23 Encounter for immunization: Secondary | ICD-10-CM | POA: Diagnosis not present

## 2023-07-04 DIAGNOSIS — I5032 Chronic diastolic (congestive) heart failure: Secondary | ICD-10-CM | POA: Diagnosis not present

## 2023-07-04 DIAGNOSIS — D509 Iron deficiency anemia, unspecified: Secondary | ICD-10-CM | POA: Diagnosis not present

## 2023-07-04 DIAGNOSIS — I10 Essential (primary) hypertension: Secondary | ICD-10-CM | POA: Diagnosis not present

## 2023-07-08 ENCOUNTER — Ambulatory Visit: Payer: Medicare Other | Attending: Cardiology | Admitting: *Deleted

## 2023-07-08 DIAGNOSIS — I480 Paroxysmal atrial fibrillation: Secondary | ICD-10-CM | POA: Diagnosis not present

## 2023-07-08 DIAGNOSIS — Z5181 Encounter for therapeutic drug level monitoring: Secondary | ICD-10-CM | POA: Diagnosis not present

## 2023-07-08 DIAGNOSIS — Z952 Presence of prosthetic heart valve: Secondary | ICD-10-CM | POA: Diagnosis not present

## 2023-07-08 LAB — POCT INR: POC INR: 2.9

## 2023-07-08 NOTE — Patient Instructions (Signed)
Description   Continue warfarin 1 tablet daily except 1/2 tablet on Sundays Recheck in 6 weeks Continue greens.

## 2023-08-06 ENCOUNTER — Ambulatory Visit: Payer: Medicare Other | Attending: Internal Medicine | Admitting: Internal Medicine

## 2023-08-06 ENCOUNTER — Encounter: Payer: Self-pay | Admitting: Internal Medicine

## 2023-08-06 VITALS — BP 142/82 | HR 76 | Ht 65.0 in | Wt 193.4 lb

## 2023-08-06 DIAGNOSIS — Z23 Encounter for immunization: Secondary | ICD-10-CM | POA: Diagnosis not present

## 2023-08-06 DIAGNOSIS — I442 Atrioventricular block, complete: Secondary | ICD-10-CM | POA: Insufficient documentation

## 2023-08-06 NOTE — Progress Notes (Signed)
HPI Douglas [name] returns today for followup. He has a h/o Bental/AVR and CHB, s/p PPM insertion several years ago. He has remained on coumadin therapy. He denies chest pain, sob, or syncope. Very mild peripheral edema. No bleeding on coumadin. He has class 2 dyspnea. He has class 2 peripheral edema.  he continues to do well.  Allergies  Allergen Reactions   Imdur [Isosorbide Nitrate] Other (See Comments)    Nausea and lightheaded      Current Outpatient Medications  Medication Sig Dispense Refill   acetaminophen (TYLENOL) 500 MG tablet Take 1,000 mg by mouth every 6 (six) hours as needed for mild pain.     allopurinol (ZYLOPRIM) 300 MG tablet Take 300 mg by mouth daily. Take 1  daily     amLODipine (NORVASC) 2.5 MG tablet Take 2.5 mg by mouth daily.     amoxicillin (AMOXIL) 500 MG capsule Take 500 mg by mouth. As need for dental work.     furosemide (LASIX) 40 MG tablet Take 20 mg by mouth every other day.     metoprolol succinate (TOPROL-XL) 25 MG 24 hr tablet TAKE (2) TABLETS BY MOUTH DAILY. 180 tablet 1   nitroGLYCERIN (NITROSTAT) 0.4 MG SL tablet Place 0.4 mg under the tongue every 5 (five) minutes x 3 doses as needed for chest pain (if no relief after 2nd dose, proceed to the ED for an evaluation or call 911).     pantoprazole (PROTONIX) 40 MG tablet TAKE ONE TABLET BY MOUTH ONCE DAILY IN THE MORNING. 30 tablet 6   pravastatin (PRAVACHOL) 20 MG tablet Take 20 mg by mouth every evening.   11   sennosides-docusate sodium (SENOKOT-S) 8.6-50 MG tablet Take 1 tablet by mouth 2 (two) times daily.     warfarin (COUMADIN) 5 MG tablet TAKE 1 TABLET BY MOUTH ONCE DAILY OR AS DIRECTED. 90 tablet 0   No current facility-administered medications for this visit.     Past Medical History:  Diagnosis Date   Acute upper GI bleed 05/18/2016   Aneurysm of thoracic aorta (HCC)    Ascending. 5.7 x 5.5cm - Bentall procedure   Aortic stenosis    Bioprosthetic AVR   Arthritis    Atrial  fibrillation (HCC)    Documented on pacer interrogation 6/13   Complete heart block (HCC)    Status post pacemaker placement   Coronary atherosclerosis of native coronary artery    Multivessel - LIMA to LAD, SVG to OM, SVG to PDA   DJD (degenerative joint disease)    Esophageal dysmotility    Essential hypertension    Fibromyalgia    Gout    H/O arteriovenous malformation (AVM)    H/O hiatal hernia    Hemorrhoid    History of esophageal ulcer    History of skin cancer    Hx of adenomatous colonic polyps    Iron deficiency anemia due to chronic blood loss    Melena    Pacemaker-Medtronic    PMR (polymyalgia rheumatica) (HCC)    SIRS (systemic inflammatory response syndrome) (HCC) 01/16/2015    ROS:   All systems reviewed and negative except as noted in the HPI.   Past Surgical History:  Procedure Laterality Date   AORTIC VALVE REPLACEMENT  01/24/12   APPENDECTOMY     BACK SURGERY     1965 or 1966 lumbar back surgery   BENTALL PROCEDURE  01/24/2012   Procedure: BENTALL PROCEDURE;  Surgeon: Kerin Perna, MD;  [facility];  Service: Open Heart Surgery;  Laterality: N/A;   BILATERAL KNEE ARTHROSCOPY     CATARACT EXTRACTION W/PHACO Left 12/03/2016   Procedure: CATARACT EXTRACTION PHACO AND INTRAOCULAR LENS PLACEMENT LEFT EYE;  Surgeon: Gemma Payor, MD;  [facility];  Service: Ophthalmology;  Laterality: Left;  CDE:  10.87   CATARACT EXTRACTION W/PHACO Right 12/13/2016   Procedure: CATARACT EXTRACTION PHACO AND INTRAOCULAR LENS PLACEMENT (IOC);  Surgeon: Gemma Payor, MD;  [facility];  Service: Ophthalmology;  Laterality: Right;  right cde 11.78   CORONARY ARTERY BYPASS GRAFT  01/24/2012   Procedure: CORONARY ARTERY BYPASS GRAFTING (CABG);  Surgeon: Kerin Perna, MD;  [facility];  Service: Open Heart Surgery;  Laterality: N/A;   ESOPHAGOGASTRODUODENOSCOPY  06/17/2012   Procedure: ESOPHAGOGASTRODUODENOSCOPY (EGD);  Surgeon: Malissa Hippo, MD;  [facility];  Service: Endoscopy;  Laterality: N/A;  ED   ESOPHAGOGASTRODUODENOSCOPY N/A 05/19/2016   Procedure: ESOPHAGOGASTRODUODENOSCOPY (EGD);  Surgeon: West Bali, MD;  [facility];  Service: Endoscopy;  Laterality: N/A;   HEMORROIDECTOMY     HERNIA REPAIR     Left inguinal hernia repair     PACEMAKER PLACEMENT     Medtronic 4/13 - Dr. Johney Frame   PERMANENT PACEMAKER INSERTION N/A 02/01/2012   Procedure: PERMANENT PACEMAKER INSERTION;  Surgeon: Hillis Range, MD; Medtronic Adapta L model ADDRL 1 (serial number [id number])       Family History  Problem Relation Age of Onset   Kidney disease Mother    Heart disease Father    Anesthesia problems Neg Hx    Hypotension Neg Hx    Malignant hyperthermia Neg Hx    Pseudochol deficiency Neg Hx      Social History   Socioeconomic History   Marital status: Married    Spouse name: Not on file   Number of children: 4   Years of education: Not on file   Highest education level: Not on file  Occupational History   Occupation: Retired    Comment: Engineer, maintenance  Tobacco Use   Smoking status: Never   Smokeless tobacco: Never  Vaping Use   Vaping status: Never Used  Substance and Sexual Activity   Alcohol use: No    Alcohol/week: 0.0 standard drinks of alcohol   Drug use: No   Sexual activity: Never    Birth control/protection: None  Other Topics Concern   Not on file  Social History Narrative   Lives with wife, has been married since 1954.   Social Determinants of Health   Financial Resource Strain: Not on file  Food Insecurity: Not on file  Transportation Needs: Not on file  Physical Activity: Not on file  Stress: Not on file  Social Connections: Not on file  Intimate Partner Violence: Not on file     BP (!) 142/82   Pulse 76   Ht 5\' 5"  (1.651 m)   Wt 193 lb 6.4 oz (87.7 kg)   SpO2 97%   BMI 32.18 kg/m   Physical Exam:  Well appearing NAD HEENT: Unremarkable Neck:  No JVD, no  thyromegally Lymphatics:  No adenopathy Back:  No CVA tenderness Lungs:  Clear with no wheezes HEART:  Regular rate rhythm, 2/6 MR murmur. Abd:  soft, positive bowel sounds, no organomegally, no rebound, no guarding Ext:  2 plus pulses, no edema, no cyanosis, no clubbing Skin:  No rashes no nodules Neuro:  CN II through XII intact, motor grossly intact  DEVICE  Normal device function.  See PaceArt for details.   Assess/Plan:  CHB - he is asymptomatic s/p PPM insertion. PPM -his medtronic DDD PM is working normally but he has been reprogrammed VVIR as he was undersensing some of his atrial fib. Chronic diastolic heart failure - his symptoms are class 2.  Atrial fib - his VR is controlled. He will continue his beta blocker.  Sharlot Gowda Douglas Wiehe,MD

## 2023-08-06 NOTE — Patient Instructions (Signed)
Medication Instructions:  Your physician recommends that you continue on your current medications as directed. Please refer to the Current Medication list given to you today.  *If you need a refill on your cardiac medications before your next appointment, please call your pharmacy*   Lab Work: NONE   If you have labs (blood work) drawn today and your tests are completely normal, you will receive your results only by: MyChart Message (if you have MyChart) OR A paper copy in the mail If you have any lab test that is abnormal or we need to change your treatment, we will call you to review the results.   Testing/Procedures: NONE    Follow-Up: At [facility], you and your health needs are our priority.  As part of our continuing mission to provide you with exceptional heart care, we have created designated Provider Care Teams.  These Care Teams include your primary Cardiologist (physician) and Advanced Practice Providers (APPs -  Physician Assistants and Nurse Practitioners) who all work together to provide you with the care you need, when you need it.  We recommend signing up for the patient portal called "MyChart".  Sign up information is provided on this After Visit Summary.  MyChart is used to connect with patients for Virtual Visits (Telemedicine).  Patients are able to view lab/test results, encounter notes, upcoming appointments, etc.  Non-urgent messages can be sent to your provider as well.   To learn more about what you can do with MyChart, go to [email or web address].    Your next appointment:   1 year(s)  Provider:   Gregg Taylor, MD    Other Instructions Thank you for choosing [facility]!    

## 2023-08-07 LAB — CUP PACEART INCLINIC DEVICE CHECK
Date Time Interrogation Session: 20241015113431
Implantable Lead Connection Status: 753985
Implantable Lead Connection Status: 753985
Implantable Lead Implant Date: 20130412
Implantable Lead Implant Date: 20130412
Implantable Lead Location: 753859
Implantable Lead Location: 753860
Implantable Lead Model: 5076
Implantable Lead Model: 5076
Implantable Pulse Generator Implant Date: 20130412

## 2023-08-19 ENCOUNTER — Ambulatory Visit: Payer: Medicare Other | Attending: Cardiology | Admitting: *Deleted

## 2023-08-19 DIAGNOSIS — Z952 Presence of prosthetic heart valve: Secondary | ICD-10-CM | POA: Diagnosis not present

## 2023-08-19 DIAGNOSIS — Z5181 Encounter for therapeutic drug level monitoring: Secondary | ICD-10-CM | POA: Diagnosis not present

## 2023-08-19 DIAGNOSIS — I480 Paroxysmal atrial fibrillation: Secondary | ICD-10-CM

## 2023-08-19 LAB — POCT INR: INR: 3.3 — AB (ref 2.0–3.0)

## 2023-08-19 NOTE — Patient Instructions (Signed)
Hold warfarin tonight then resume 1 tablet daily except 1/2 tablet on Sundays Recheck in 6 weeks Continue greens.

## 2023-08-20 ENCOUNTER — Ambulatory Visit (INDEPENDENT_AMBULATORY_CARE_PROVIDER_SITE_OTHER): Payer: Medicare Other

## 2023-08-20 DIAGNOSIS — I442 Atrioventricular block, complete: Secondary | ICD-10-CM

## 2023-08-21 LAB — CUP PACEART REMOTE DEVICE CHECK
Battery Impedance: 2654 Ohm
Battery Remaining Longevity: 33 mo
Battery Voltage: 2.72 V
Brady Statistic RV Percent Paced: 100 %
Date Time Interrogation Session: 20241029130014
Implantable Lead Connection Status: 753985
Implantable Lead Connection Status: 753985
Implantable Lead Implant Date: 20130412
Implantable Lead Implant Date: 20130412
Implantable Lead Location: 753859
Implantable Lead Location: 753860
Implantable Lead Model: 5076
Implantable Lead Model: 5076
Implantable Pulse Generator Implant Date: 20130412
Lead Channel Impedance Value: 534 Ohm
Lead Channel Impedance Value: 67 Ohm
Lead Channel Pacing Threshold Amplitude: 0.75 V
Lead Channel Pacing Threshold Pulse Width: 0.4 ms
Lead Channel Setting Pacing Amplitude: 2 V
Lead Channel Setting Pacing Pulse Width: 0.4 ms
Lead Channel Setting Sensing Sensitivity: 4 mV
Zone Setting Status: 755011
Zone Setting Status: 755011

## 2023-08-29 ENCOUNTER — Ambulatory Visit: Payer: Medicare Other

## 2023-08-29 ENCOUNTER — Encounter: Payer: Self-pay | Admitting: Emergency Medicine

## 2023-08-29 ENCOUNTER — Other Ambulatory Visit: Payer: Self-pay

## 2023-08-29 ENCOUNTER — Ambulatory Visit
Admission: EM | Admit: 2023-08-29 | Discharge: 2023-08-29 | Disposition: A | Discharge status: Home / Self Care | Payer: Medicare Other | Attending: Family Medicine | Admitting: Family Medicine

## 2023-08-29 DIAGNOSIS — R55 Syncope and collapse: Secondary | ICD-10-CM | POA: Diagnosis not present

## 2023-08-29 DIAGNOSIS — I517 Cardiomegaly: Secondary | ICD-10-CM | POA: Diagnosis not present

## 2023-08-29 DIAGNOSIS — R531 Weakness: Secondary | ICD-10-CM

## 2023-08-29 DIAGNOSIS — R42 Dizziness and giddiness: Secondary | ICD-10-CM | POA: Diagnosis not present

## 2023-08-29 LAB — POCT FASTING CBG KUC MANUAL ENTRY: POCT Glucose (KUC): 133 mg/dL — AB (ref 70–99)

## 2023-08-29 NOTE — ED Provider Notes (Addendum)
[facility]    CSN: [id number] Arrival date & time: 08/29/23  1402      History   Chief Complaint Chief Complaint  Patient presents with   Loss of Consciousness    HPI Douglas [name] is a 89 y.o. male.   Patient presenting today for a syncopal-like episode that occurred this afternoon.  He states he was standing at the kitchen sink washing dishes when he became very dizzy.  He states he asked his wife to bring him a chair to sit down and and when he turned to sit into the chair he fell down and hit his right ribs on the cabinet.  He does not think that he had any episode of unconsciousness but is not certain.  He states for the past few days he has been feeling a bit lightheaded and weak but otherwise no new symptoms out of the ordinary.  States he still a bit lightheaded but otherwise feeling in usual state of health currently.  He denies any head injury, severe headache, decreased range of motion, chest pain, shortness of breath, palpitations.  Not trying anything for symptoms.  Of note, does have a pacemaker in place.    Past Medical History:  Diagnosis Date   Acute upper GI bleed 05/18/2016   Aneurysm of thoracic aorta (HCC)    Ascending. 5.7 x 5.5cm - Bentall procedure   Aortic stenosis    Bioprosthetic AVR   Arthritis    Atrial fibrillation (HCC)    Documented on pacer interrogation 6/13   Complete heart block (HCC)    Status post pacemaker placement   Coronary atherosclerosis of native coronary artery    Multivessel - LIMA to LAD, SVG to OM, SVG to PDA   DJD (degenerative joint disease)    Esophageal dysmotility    Essential hypertension    Fibromyalgia    Gout    H/O arteriovenous malformation (AVM)    H/O hiatal hernia    Hemorrhoid    History of esophageal ulcer    History of skin cancer    Hx of adenomatous colonic polyps    Iron deficiency anemia due to chronic blood loss    Melena    Pacemaker-Medtronic    PMR (polymyalgia rheumatica) (HCC)     SIRS (systemic inflammatory response syndrome) (HCC) 01/16/2015    Patient Active Problem List   Diagnosis Date Noted   Oropharyngeal dysphagia 12/24/2022   Regurgitation of food 12/24/2022   Hemorrhoid 12/11/2017   Constipation 12/11/2017   Iron deficiency anemia due to chronic blood loss 10/03/2016   Acute upper GI bleed 05/18/2016   Melena    Symptomatic anemia    Hypotension    History of esophageal ulcer    H/O arteriovenous malformation (AVM)    Chronic anticoagulation - Coumadin, CHADS2VASC=5    UTI (urinary tract infection) 01/16/2015   SIRS (systemic inflammatory response syndrome) (HCC) 01/16/2015   Precordial pain 06/15/2014   Aortic valve replaced 08/25/2012   Pacemaker-Medtronic 07/29/2012   Atrial fibrillation (HCC) 02/11/2012   CHB (complete heart block) (HCC) 01/31/2012   Aneurysm of thoracic aorta (HCC)    Coronary atherosclerosis of native coronary artery 05/10/2011   Essential hypertension, benign    Aortic stenosis     Past Surgical History:  Procedure Laterality Date   AORTIC VALVE REPLACEMENT  01/24/12   APPENDECTOMY     BACK SURGERY     1965 or 1966 lumbar back surgery   BENTALL PROCEDURE  01/24/2012  Procedure: BENTALL PROCEDURE;  Surgeon: Kerin Perna, MD;  Location: [facility] OR;  Service: Open Heart Surgery;  Laterality: N/A;   BILATERAL KNEE ARTHROSCOPY     CATARACT EXTRACTION W/PHACO Left 12/03/2016   Procedure: CATARACT EXTRACTION PHACO AND INTRAOCULAR LENS PLACEMENT LEFT EYE;  Surgeon: Gemma Payor, MD;  Location: [facility];  Service: Ophthalmology;  Laterality: Left;  CDE:  10.87   CATARACT EXTRACTION W/PHACO Right 12/13/2016   Procedure: CATARACT EXTRACTION PHACO AND INTRAOCULAR LENS PLACEMENT (IOC);  Surgeon: Gemma Payor, MD;  Location: [facility];  Service: Ophthalmology;  Laterality: Right;  right cde 11.78   CORONARY ARTERY BYPASS GRAFT  01/24/2012   Procedure: CORONARY ARTERY BYPASS GRAFTING (CABG);  Surgeon: Kerin Perna, MD;  Location: [facility] OR;   Service: Open Heart Surgery;  Laterality: N/A;   ESOPHAGOGASTRODUODENOSCOPY  06/17/2012   Procedure: ESOPHAGOGASTRODUODENOSCOPY (EGD);  Surgeon: Malissa Hippo, MD;  Location: [facility];  Service: Endoscopy;  Laterality: N/A;  ED   ESOPHAGOGASTRODUODENOSCOPY N/A 05/19/2016   Procedure: ESOPHAGOGASTRODUODENOSCOPY (EGD);  Surgeon: West Bali, MD;  Location: [facility];  Service: Endoscopy;  Laterality: N/A;   HEMORROIDECTOMY     HERNIA REPAIR     Left inguinal hernia repair     PACEMAKER PLACEMENT     Medtronic 4/13 - Dr. Johney Frame   PERMANENT PACEMAKER INSERTION N/A 02/01/2012   Procedure: PERMANENT PACEMAKER INSERTION;  Surgeon: Hillis Range, MD; Medtronic Adapta L model ADDRL 1 (serial number [id number])         Home Medications    Prior to Admission medications   Medication Sig Start Date End Date Taking? Authorizing Provider  acetaminophen (TYLENOL) 500 MG tablet Take 1,000 mg by mouth every 6 (six) hours as needed for mild pain.    [provider]  allopurinol (ZYLOPRIM) 300 MG tablet Take 300 mg by mouth daily. Take 1  daily    [provider]  amLODipine (NORVASC) 2.5 MG tablet Take 2.5 mg by mouth daily.    [provider]  amoxicillin (AMOXIL) 500 MG capsule Take 500 mg by mouth. As need for dental work.    [provider]  furosemide (LASIX) 40 MG tablet Take 20 mg by mouth every other day.    [provider]  metoprolol succinate (TOPROL-XL) 25 MG 24 hr tablet TAKE (2) TABLETS BY MOUTH DAILY. 01/23/23   Jonelle Sidle, MD  nitroGLYCERIN (NITROSTAT) 0.4 MG SL tablet Place 0.4 mg under the tongue every 5 (five) minutes x 3 doses as needed for chest pain (if no relief after 2nd dose, proceed to the ED for an evaluation or call 911).    [provider]  pantoprazole (PROTONIX) 40 MG tablet TAKE ONE TABLET BY MOUTH ONCE DAILY IN THE MORNING. 11/24/15   Jonelle Sidle, MD  pravastatin (PRAVACHOL) 20 MG tablet Take 20 mg  by mouth every evening.  10/27/15   [provider]  sennosides-docusate sodium (SENOKOT-S) 8.6-50 MG tablet Take 1 tablet by mouth 2 (two) times daily. 12/30/17   Malissa Hippo, MD  warfarin (COUMADIN) 5 MG tablet TAKE 1 TABLET BY MOUTH ONCE DAILY OR AS DIRECTED. 05/31/23   Jonelle Sidle, MD    Family History Family History  Problem Relation Age of Onset   Kidney disease Mother    Heart disease Father    Anesthesia problems Neg Hx    Hypotension Neg Hx    Malignant hyperthermia Neg Hx    Pseudochol deficiency Neg Hx  Social History Social History   Tobacco Use   Smoking status: Never   Smokeless tobacco: Never  Vaping Use   Vaping status: Never Used  Substance Use Topics   Alcohol use: No    Alcohol/week: 0.0 standard drinks of alcohol   Drug use: No     Allergies   Imdur [isosorbide nitrate]   Review of Systems Review of Systems Per HPI  Physical Exam Triage Vital Signs ED Triage Vitals [08/29/23 1412]  Encounter Vitals Group     BP (!) 161/78     Systolic BP Percentile      Diastolic BP Percentile      Pulse Rate 75     Resp 20     Temp 97.7 F (36.5 C)     Temp Source Oral     SpO2 93 %     Weight      Height      Head Circumference      Peak Flow      Pain Score      Pain Loc      Pain Education      Exclude from Growth Chart    Orthostatic VS for the past 24 hrs:  BP- Lying Pulse- Lying BP- Sitting Pulse- Sitting BP- Standing at 0 minutes Pulse- Standing at 0 minutes  08/29/23 1422 146/68 71 137/76 70 148/73 70    Updated Vital Signs BP (!) 161/78 (BP Location: Right Arm)   Pulse 75   Temp 97.7 F (36.5 C) (Oral)   Resp 20   SpO2 93%   Visual Acuity Right Eye Distance:   Left Eye Distance:   Bilateral Distance:    Right Eye Near:   Left Eye Near:    Bilateral Near:     Physical Exam Vitals and nursing note reviewed.  Constitutional:      Appearance: Normal appearance.  HENT:     Head: Atraumatic.      Mouth/Throat:     Mouth: Mucous membranes are moist.  Eyes:     Extraocular Movements: Extraocular movements intact.     Conjunctiva/sclera: Conjunctivae normal.  Cardiovascular:     Comments: Paced rhythm Pulmonary:     Effort: Pulmonary effort is normal.     Breath sounds: Normal breath sounds.     Comments: Chest rise symmetric bilaterally Musculoskeletal:        General: Tenderness and signs of injury present.     Cervical back: Normal range of motion and neck supple.     Comments: Currently in wheelchair due to dizziness, ambulatory at baseline  Range of motion of all extremities within normal limits, strength full and equal bilaterally.  No bony deformity palpable in the area of impact to the ribs  Skin:    General: Skin is warm and dry.     Comments: Superficial abrasions to right lateral rib region where his back scraped against the cabinets.  Tender to palpation in this area  Neurological:     General: No focal deficit present.     Mental Status: He is oriented to person, place, and time.     Cranial Nerves: No cranial nerve deficit.     Motor: No weakness.  Psychiatric:        Mood and Affect: Mood normal.        Thought Content: Thought content normal.        Judgment: Judgment normal.      UC Treatments / Results  Labs (all labs ordered  are listed, but only abnormal results are displayed) Labs Reviewed  POCT FASTING CBG [facility] MANUAL ENTRY - Abnormal; Notable for the following components:      Result Value   POCT Glucose ([facility]) 133 (*)    All other components within normal limits  COMPREHENSIVE METABOLIC PANEL  CBC WITH DIFFERENTIAL/PLATELET    EKG   Radiology No results found.  Procedures Procedures (including critical care time)  Medications Ordered in UC Medications - No data to display  Initial Impression / Assessment and Plan / UC Course  I have reviewed the triage vital signs and the nursing notes.  Pertinent labs & imaging results that were  available during my care of the patient were reviewed by me and considered in my medical decision making (see chart for details).     Hypertensive in triage, otherwise vital signs within normal limits.  Orthostatics without significant change, point-of-care random glucose 133, EKG unchanged from previous, ventricular paced rhythm with no obvious ST elevations.  Chest and right rib x-ray pending.  Labs pending, discussed reassuring findings and no focal deficits with patient but that we do not have the resources to do any further workup at this time to figure out why he is dizzy or having these presyncopal episodes.  He is on a blood thinner, discussed risks of internal bleeding though no apparent head injury today.  He adamantly declines going to the emergency department for further evaluation.  Discussed close monitoring at home and going to the ED if symptoms worsen though my recommendation continues to be going to the emergency department immediately for further evaluation.  Family member here to drive him home.  Final Clinical Impressions(s) / UC Diagnoses   Final diagnoses:  Weakness  Dizziness  Pre-syncope     Discharge Instructions      We recommend that you go to the emergency department for further evaluation of your symptoms, however we have been as much workup as we can in the setting and so far are not able to identify an obvious cause of your symptoms.  I have sent out some blood work which should be back tomorrow or the next day but again recommend that you go to the emergency department for further evaluation immediately.  Call your primary care provider as soon as possible if you do not want to the emergency department for close follow-up.    ED Prescriptions   None    PDMP not reviewed this encounter.   Particia Nearing, PA-C 08/29/23 1622    Particia Nearing, [place] 08/29/23 1623

## 2023-08-29 NOTE — Discharge Instructions (Signed)
We recommend that you go to the emergency department for further evaluation of your symptoms, however we have been as much workup as we can in the setting and so far are not able to identify an obvious cause of your symptoms.  I have sent out some blood work which should be back tomorrow or the next day but again recommend that you go to the emergency department for further evaluation immediately.  Call your primary care provider as soon as possible if you do not want to the emergency department for close follow-up.

## 2023-08-29 NOTE — ED Triage Notes (Addendum)
Pt reports syncopal episode this afternoon approx 1230-1245 this afternoon. Pt reports was standing at sink to do dishes and reports turned and fell. Pt reports right shoulder pain and lower back pain ever since. Pt is on coumadin.  Pt reports "I don't feel right." Pt alert. PA at bedside.

## 2023-08-30 ENCOUNTER — Encounter: Payer: Self-pay | Admitting: Cardiology

## 2023-08-30 LAB — COMPREHENSIVE METABOLIC PANEL
ALT: 13 [IU]/L (ref 0–44)
AST: 22 [IU]/L (ref 0–40)
Albumin: 4.1 g/dL (ref 3.6–4.6)
Alkaline Phosphatase: 155 [IU]/L — ABNORMAL HIGH (ref 44–121)
BUN/Creatinine Ratio: 22 (ref 10–24)
BUN: 27 mg/dL (ref 10–36)
Bilirubin Total: 0.6 mg/dL (ref 0.0–1.2)
CO2: 21 mmol/L (ref 20–29)
Calcium: 8.9 mg/dL (ref 8.6–10.2)
Chloride: 106 mmol/L (ref 96–106)
Creatinine, Ser: 1.23 mg/dL (ref 0.76–1.27)
Globulin, Total: 3 g/dL (ref 1.5–4.5)
Glucose: 134 mg/dL — ABNORMAL HIGH (ref 70–99)
Potassium: 3.9 mmol/L (ref 3.5–5.2)
Sodium: 143 mmol/L (ref 134–144)
Total Protein: 7.1 g/dL (ref 6.0–8.5)
eGFR: 55 mL/min/{1.73_m2} — ABNORMAL LOW (ref >59)

## 2023-08-30 LAB — CBC WITH DIFFERENTIAL/PLATELET
Basophils Absolute: 0.1 10*3/uL (ref 0.0–0.2)
Basos: 1 %
EOS (ABSOLUTE): 0.3 10*3/uL (ref 0.0–0.4)
Eos: 3 %
Hematocrit: 37.7 % (ref 37.5–51.0)
Hemoglobin: 12.2 g/dL — ABNORMAL LOW (ref 13.0–17.7)
Immature Grans (Abs): 0 10*3/uL (ref 0.0–0.1)
Immature Granulocytes: 1 %
Lymphocytes Absolute: 1.4 10*3/uL (ref 0.7–3.1)
Lymphs: 17 %
MCH: 30.9 pg (ref 26.6–33.0)
MCHC: 32.4 g/dL (ref 31.5–35.7)
MCV: 95 fL (ref 79–97)
Monocytes Absolute: 0.6 10*3/uL (ref 0.1–0.9)
Monocytes: 8 %
Neutrophils Absolute: 5.7 10*3/uL (ref 1.4–7.0)
Neutrophils: 70 %
Platelets: 195 10*3/uL (ref 150–450)
RBC: 3.95 x10E6/uL — ABNORMAL LOW (ref 4.14–5.80)
RDW: 13.7 % (ref 11.6–15.4)
WBC: 8.2 10*3/uL (ref 3.4–10.8)

## 2023-08-30 NOTE — Telephone Encounter (Signed)
Error

## 2023-08-31 NOTE — Progress Notes (Unsigned)
Cardiology Office Note:  .   Date:  08/31/2023  ID:  Theola Sequin, DOB [birth year], MRN [id number] PCP: Carylon Perches, MD  [facility] Providers Cardiologist:  Nona Dell, MD Electrophysiologist:  Lewayne Bunting, MD  History of Present Illness: .   JALIEL WINKEL is a 89 y.o. male with a past medical history of Bental/AVR, CHB s/p PPM, paroxysmal atrial fibrillation, CAD s/p CABG. Patient is followed by Dr. Diona Browner and his PPM is followed by Dr. Ladona Ridgel.   Patient previously underwent CABGx3, Bentall procedure and AVR in 2013. After the procedure, patient developed CHB and had a PPM implanted. Device has been followed by Dr. Ladona Ridgel. Detected atrial fibrillation, and patient was started on warfarin. Most recent nuclear stress test from 2019 showed findings consistent with a mild-moderate degree of ischemia with EF 16%. Echocardiogram in 09/2018 showed EF 55-60%, mild LVH. Transvalvular velocity was normal through the aortic valve prosthesis.   Patient was last seen by Dr. Diona Browner on 05/10/2023.  A he remained on Coumadin t that time, patient complained of chronic fatigue.  Denies angina or palpitations.  Remained on Coumadin, Toprol-XL, amlodipine, Lasix, pravastatin.  Patient was last seen by Dr. Ladona Ridgel on 08/06/2023.  Was doing well at that time.  Patient was seen in urgent care on 08/29/23 for an episode of near syncope/dizziness. Orthostatic vital signs were negative. EKG showed V-paced rhythm. Labs showed Na 143, K 3.9, creatinine 1.23, WBC 8.2, hemoglobin 12.2. Patient was encouraged to go to the ED for further workup, but he opted to go home and follow up with cardiology   Near Syncope   CHB - Patient had PPM implanted in 2013  - Device followed by Dr. Ladona Ridgel  - Most recent device check from 10/29 showed normal device function, one NSVT arrhythmia detected  - Continue remote device checks every 3 months   CAD  - Patient previously underwent CABG in 2013 with LIMA-LAD, SVG-OM,  SVG-PDA  - Patient denies chest pain  - Not on ASA due to coumadin use  - Continue metoprolol succinate 50 mg daily, amlodipine 2.5 mg daily   - Continue pravastatin 20 mg daily   S/p Bentall procedure and AVR  - Underwent Bentall procedure  with 23 mm Edwards pericardial valve along with 28mm Gelweave Valsalva ascending aorta graft in 2013 at the time of CABG  - Most recent echocardiogram from 2019 showed EF 55-60%, stable AVR with no aortic regurgitation  - Ordered repeat echocardiogram - Not on ASA due to coumadin use   PAF  -  - Continue coumadin  - Continue metoprolol succinate 50 mg daily    ROS: ***  Studies Reviewed: .        *** Risk Assessment/Calculations:   {Does this patient have ATRIAL FIBRILLATION?:[id number]} No BP recorded.  {Refresh Note OR Click here to enter BP  :1}***       Physical Exam:   VS:  There were no vitals taken for this visit.   Wt Readings from Last 3 Encounters:  08/06/23 193 lb 6.4 oz (87.7 kg)  05/10/23 190 lb 3.2 oz (86.3 kg)  12/24/22 205 lb 1.6 oz (93 kg)    GEN: Well nourished, well developed in no acute distress NECK: No JVD; No carotid bruits CARDIAC: ***RRR, no murmurs, rubs, gallops RESPIRATORY:  Clear to auscultation without rales, wheezing or rhonchi  ABDOMEN: Soft, non-tender, non-distended EXTREMITIES:  No edema; No deformity   ASSESSMENT AND PLAN: .   ***    {  Are you ordering a CV Procedure (e.g. stress test, cath, DCCV, TEE, etc)?   Press F2        :[id number]}  Dispo: ***  Signed, Jonita Albee, PA-C

## 2023-09-02 ENCOUNTER — Telehealth: Payer: Self-pay

## 2023-09-02 ENCOUNTER — Ambulatory Visit: Payer: Medicare Other | Attending: Cardiology | Admitting: Cardiology

## 2023-09-02 ENCOUNTER — Encounter: Payer: Self-pay | Admitting: Cardiology

## 2023-09-02 VITALS — BP 100/70 | HR 70 | Ht 65.0 in | Wt 187.0 lb

## 2023-09-02 DIAGNOSIS — R55 Syncope and collapse: Secondary | ICD-10-CM | POA: Insufficient documentation

## 2023-09-02 DIAGNOSIS — I2510 Atherosclerotic heart disease of native coronary artery without angina pectoris: Secondary | ICD-10-CM | POA: Insufficient documentation

## 2023-09-02 DIAGNOSIS — I25119 Atherosclerotic heart disease of native coronary artery with unspecified angina pectoris: Secondary | ICD-10-CM

## 2023-09-02 DIAGNOSIS — I442 Atrioventricular block, complete: Secondary | ICD-10-CM | POA: Diagnosis not present

## 2023-09-02 DIAGNOSIS — Z950 Presence of cardiac pacemaker: Secondary | ICD-10-CM | POA: Insufficient documentation

## 2023-09-02 DIAGNOSIS — Z952 Presence of prosthetic heart valve: Secondary | ICD-10-CM | POA: Insufficient documentation

## 2023-09-02 DIAGNOSIS — I480 Paroxysmal atrial fibrillation: Secondary | ICD-10-CM | POA: Insufficient documentation

## 2023-09-02 NOTE — Telephone Encounter (Signed)
Manual transmission received. Normal device function.  No episodes noted 08/29/2023.

## 2023-09-02 NOTE — Patient Instructions (Addendum)
Medication Instructions:  Stop Amlodipine *If you need a refill on your cardiac medications before your next appointment, please call your pharmacy*  Lab Work: No labs  Testing/Procedures: Your physician has requested that you have an echocardiogram. Echocardiography is a painless test that uses sound waves to create images of your heart. It provides your doctor with information about the size and shape of your heart and how well your heart's chambers and valves are working. This procedure takes approximately one hour. There are no restrictions for this procedure. Please do NOT wear cologne, perfume, aftershave, or lotions (deodorant is allowed). Please arrive 15 minutes prior to your appointment time.  Please note: We ask at that you not bring children with you during ultrasound (echo/ vascular) testing. Due to room size and safety concerns, children are not allowed in the ultrasound rooms during exams. Our front office staff cannot provide observation of children in our lobby area while testing is being conducted. An adult accompanying a patient to their appointment will only be allowed in the ultrasound room at the discretion of the ultrasound technician under special circumstances. We apologize for any inconvenience.   Follow-Up: At [facility], you and your health needs are our priority.  As part of our continuing mission to provide you with exceptional heart care, we have created designated Provider Care Teams.  These Care Teams include your primary Cardiologist (physician) and Advanced Practice Providers (APPs -  Physician Assistants and Nurse Practitioners) who all work together to provide you with the care you need, when you need it.  We recommend signing up for the patient portal called "MyChart".  Sign up information is provided on this After Visit Summary.  MyChart is used to connect with patients for Virtual Visits (Telemedicine).  Patients are able to view lab/test results,  encounter notes, upcoming appointments, etc.  Non-urgent messages can be sent to your provider as well.   To learn more about what you can do with MyChart, go to [email or web address].    Your next appointment:   8 weeks  Provider:   Nona Dell, MD or any APP from [place] or [place]

## 2023-09-05 ENCOUNTER — Ambulatory Visit: Payer: Medicare Other | Admitting: Nurse Practitioner

## 2023-09-06 NOTE — Telephone Encounter (Signed)
Called patient advised of below they verbalized understanding.

## 2023-09-10 ENCOUNTER — Ambulatory Visit (HOSPITAL_COMMUNITY)
Admission: RE | Admit: 2023-09-10 | Discharge: 2023-09-10 | Disposition: A | Discharge status: Home / Self Care | Payer: Medicare Other | Source: Ambulatory Visit | Attending: Cardiology | Admitting: Cardiology

## 2023-09-10 DIAGNOSIS — R55 Syncope and collapse: Secondary | ICD-10-CM | POA: Insufficient documentation

## 2023-09-10 LAB — ECHOCARDIOGRAM COMPLETE
AR max vel: 1.42 cm2
AV Area VTI: 1.42 cm2
AV Area mean vel: 1.49 cm2
AV Mean grad: 5 mm[Hg]
AV Peak grad: 9.6 mm[Hg]
Ao pk vel: 1.55 m/s
Area-P 1/2: 3.72 cm2
MV M vel: 5.1 m/s
MV Peak grad: 104 mm[Hg]
S' Lateral: 3.5 cm

## 2023-09-10 NOTE — Progress Notes (Signed)
*  PRELIMINARY RESULTS* Echocardiogram 2D Echocardiogram has been performed.  Stacey Drain 09/10/2023, 11:22 AM

## 2023-09-10 NOTE — Progress Notes (Signed)
Remote pacemaker transmission.   

## 2023-09-11 ENCOUNTER — Telehealth: Payer: Self-pay

## 2023-09-11 NOTE — Telephone Encounter (Signed)
Called patient advised of below they verbalized understanding. Patient will keep track of blood pressure at home and bring reading and Blood pressure cuff to appointment.

## 2023-09-11 NOTE — Telephone Encounter (Signed)
-----   Message from Jonita Albee sent at 09/10/2023  6:56 PM EST ----- Please tell patient that his echocardiogram showed normal EF (pumping function of the heart) at 55-60%. There were no wall motion abnormalities. Heart walls are a little thick, which can happen with age. RV function is low-range of normal. The mitral valve is a bit leaky, which is something we will monitor over time. The aortic valve valve is functioning normally without thickening or leaking.   Overall, nothing on echocardiogram that would cause him to pass out  Thanks KJ

## 2023-09-27 DIAGNOSIS — Z79899 Other long term (current) drug therapy: Secondary | ICD-10-CM | POA: Diagnosis not present

## 2023-09-27 DIAGNOSIS — I5032 Chronic diastolic (congestive) heart failure: Secondary | ICD-10-CM | POA: Diagnosis not present

## 2023-09-27 DIAGNOSIS — D509 Iron deficiency anemia, unspecified: Secondary | ICD-10-CM | POA: Diagnosis not present

## 2023-09-30 ENCOUNTER — Ambulatory Visit: Payer: Medicare Other | Attending: Cardiology | Admitting: *Deleted

## 2023-09-30 DIAGNOSIS — Z5181 Encounter for therapeutic drug level monitoring: Secondary | ICD-10-CM | POA: Diagnosis not present

## 2023-09-30 DIAGNOSIS — Z952 Presence of prosthetic heart valve: Secondary | ICD-10-CM | POA: Diagnosis not present

## 2023-09-30 DIAGNOSIS — I480 Paroxysmal atrial fibrillation: Secondary | ICD-10-CM | POA: Diagnosis not present

## 2023-09-30 LAB — POCT INR: INR: 2 (ref 2.0–3.0)

## 2023-09-30 NOTE — Patient Instructions (Signed)
Continue warfarin 1 tablet daily except 1/2 tablet on Sundays Recheck in 6 weeks Continue greens

## 2023-10-01 ENCOUNTER — Other Ambulatory Visit: Payer: Self-pay | Admitting: Cardiology

## 2023-10-01 NOTE — Telephone Encounter (Signed)
Refill request for warfarin:  Last INR was 2.0 on 09/30/23 Next INR due 11/11/23 LOV was 08/06/23  Refill approved.

## 2023-10-04 DIAGNOSIS — I480 Paroxysmal atrial fibrillation: Secondary | ICD-10-CM | POA: Diagnosis not present

## 2023-10-04 DIAGNOSIS — I5032 Chronic diastolic (congestive) heart failure: Secondary | ICD-10-CM | POA: Diagnosis not present

## 2023-10-04 DIAGNOSIS — I10 Essential (primary) hypertension: Secondary | ICD-10-CM | POA: Diagnosis not present

## 2023-10-17 ENCOUNTER — Other Ambulatory Visit: Payer: Self-pay | Admitting: Cardiology

## 2023-10-22 ENCOUNTER — Emergency Department (HOSPITAL_COMMUNITY)
Admission: EM | Admit: 2023-10-22 | Discharge: 2023-10-22 | Disposition: A | Discharge status: Home / Self Care | Payer: Medicare Other

## 2023-10-22 ENCOUNTER — Other Ambulatory Visit: Payer: Self-pay

## 2023-10-22 ENCOUNTER — Encounter (HOSPITAL_COMMUNITY): Payer: Self-pay | Admitting: *Deleted

## 2023-10-22 DIAGNOSIS — R040 Epistaxis: Secondary | ICD-10-CM | POA: Insufficient documentation

## 2023-10-22 MED ORDER — OXYMETAZOLINE HCL 0.05 % NA SOLN
1.0000 | Freq: Once | NASAL | Status: AC
  Administered 2023-10-22: 1 via NASAL
  Filled 2023-10-22: qty 30

## 2023-10-22 MED ORDER — OXYMETAZOLINE HCL 0.05 % NA SOLN: 1.0000 | NASAL | 0 refills | Status: DC | PRN

## 2023-10-22 NOTE — ED Provider Notes (Signed)
 [place] EMERGENCY DEPARTMENT AT [facility] Provider Note   CSN: [id number] Arrival date & time: 10/22/23  1207     History  Chief Complaint  Patient presents with   Epistaxis    Douglas [name] is a 89 y.o. male.  This is a 89 year old male to the emergency department for epistaxis.  Notes bleeding approximately 20 to 30 minutes prior to arrival.  He is on warfarin.  Patient compliant with his medications.  No trauma to the nose.  Bleeding stopped prior to my evaluation with pressure and Afrin by nursing staff.   Epistaxis      Home Medications Prior to Admission medications   Medication Sig Start Date End Date Taking? Authorizing Provider  oxymetazoline  (AFRIN) 0.05 % nasal spray Place 1 spray into both nostrils as needed (nose bleed). 10/22/23  Yes Neysa Caron PARAS, DO  acetaminophen  (TYLENOL ) 500 MG tablet Take 1,000 mg by mouth every 6 (six) hours as needed for mild pain.    [provider]  allopurinol  (ZYLOPRIM ) 300 MG tablet Take 300 mg by mouth daily. Take 1  daily    [provider]  amoxicillin  (AMOXIL ) 500 MG capsule Take 500 mg by mouth. As need for dental work.    [provider]  furosemide  (LASIX ) 40 MG tablet Take 20 mg by mouth every other day.    [provider]  metoprolol  succinate (TOPROL -XL) 25 MG 24 hr tablet TAKE (2) TABLETS BY MOUTH DAILY. 10/17/23   Debera Jayson MATSU, MD  nitroGLYCERIN  (NITROSTAT ) 0.4 MG SL tablet Place 0.4 mg under the tongue every 5 (five) minutes x 3 doses as needed for chest pain (if no relief after 2nd dose, proceed to the ED for an evaluation or call 911).    [provider]  pantoprazole  (PROTONIX ) 40 MG tablet TAKE ONE TABLET BY MOUTH ONCE DAILY IN THE MORNING. 11/24/15   Debera Jayson MATSU, MD  pravastatin  (PRAVACHOL ) 20 MG tablet Take 20 mg by mouth every evening.  10/27/15   [provider]  sennosides-docusate sodium  (SENOKOT-S) 8.6-50 MG tablet Take 1 tablet by  mouth 2 (two) times daily. 12/30/17   Golda Claudis PENNER, MD  warfarin (COUMADIN ) 5 MG tablet TAKE 1 TABLET BY MOUTH ONCE DAILY OR AS DIRECTED. 10/01/23   Debera Jayson MATSU, MD      Allergies    Imdur  [isosorbide  nitrate]    Review of Systems   Review of Systems  HENT:  Positive for nosebleeds.     Physical Exam Updated Vital Signs BP 133/69   Pulse 70   Temp 97.8 F (36.6 C) (Oral)   Resp (!) 27   Ht 5' 5 (1.651 m)   Wt 86.2 kg   SpO2 98%   BMI 31.62 kg/m  Physical Exam Vitals and nursing note reviewed.  Constitutional:      General: He is not in acute distress.    Appearance: He is not toxic-appearing.  HENT:     Nose: Nose normal.     Comments: Some friable tissue to the anterior left nare.  No active bleeding. Eyes:     Conjunctiva/sclera: Conjunctivae normal.  Cardiovascular:     Rate and Rhythm: Normal rate and regular rhythm.  Pulmonary:     Effort: Pulmonary effort is normal.  Abdominal:     General: Abdomen is flat. There is no distension.     Tenderness: There is no abdominal tenderness.  Neurological:     General: No focal deficit  present.     Mental Status: He is alert.  Psychiatric:        Mood and Affect: Mood normal.        Behavior: Behavior normal.     ED Results / Procedures / Treatments   Labs (all labs ordered are listed, but only abnormal results are displayed) Labs Reviewed - No data to display  EKG None  Radiology No results found.  Procedures Procedures    Medications Ordered in ED Medications  oxymetazoline  (AFRIN) 0.05 % nasal spray 1 spray (1 spray Each Nare Given 10/22/23 1232)    ED Course/ Medical Decision Making/ A&P                                 Medical Decision Making Well-appearing 89 year old male present emergency department epistaxis.  Afebrile vital signs reassuring.  Bleeding stopped with Afrin and pressure.  No trauma.  Low suspicion for acute blood loss anemia.  Patient to follow-up with primary  doctor.  Will discharge in stable condition.  Risk OTC drugs.          Final Clinical Impression(s) / ED Diagnoses Final diagnoses:  Epistaxis    Rx / DC Orders ED Discharge Orders          Ordered    oxymetazoline  (AFRIN) 0.05 % nasal spray  As needed        10/22/23 1307              Neysa Caron PARAS, DO 10/22/23 1308

## 2023-10-22 NOTE — ED Notes (Signed)
Bleeding controlled after afrin spray to left nare and cold washcloth and pressure to nose held for 2-3 minutes.  Pt states nose bleed couple times earlier but was able to control it at home.

## 2023-10-22 NOTE — Discharge Instructions (Addendum)
Please follow-up with your primary doctor.  Again

## 2023-10-24 DIAGNOSIS — H0102A Squamous blepharitis right eye, upper and lower eyelids: Secondary | ICD-10-CM | POA: Diagnosis not present

## 2023-10-24 DIAGNOSIS — D492 Neoplasm of unspecified behavior of bone, soft tissue, and skin: Secondary | ICD-10-CM | POA: Diagnosis not present

## 2023-10-24 DIAGNOSIS — H04123 Dry eye syndrome of bilateral lacrimal glands: Secondary | ICD-10-CM | POA: Diagnosis not present

## 2023-10-24 DIAGNOSIS — Z961 Presence of intraocular lens: Secondary | ICD-10-CM | POA: Diagnosis not present

## 2023-10-24 DIAGNOSIS — H0102B Squamous blepharitis left eye, upper and lower eyelids: Secondary | ICD-10-CM | POA: Diagnosis not present

## 2023-10-30 ENCOUNTER — Ambulatory Visit: Payer: Medicare Other | Attending: Student | Admitting: Student

## 2023-10-30 ENCOUNTER — Encounter: Payer: Self-pay | Admitting: Student

## 2023-10-30 VITALS — BP 130/80 | HR 90 | Ht 65.0 in | Wt 181.6 lb

## 2023-10-30 DIAGNOSIS — I2510 Atherosclerotic heart disease of native coronary artery without angina pectoris: Secondary | ICD-10-CM | POA: Diagnosis not present

## 2023-10-30 DIAGNOSIS — I10 Essential (primary) hypertension: Secondary | ICD-10-CM

## 2023-10-30 DIAGNOSIS — I442 Atrioventricular block, complete: Secondary | ICD-10-CM | POA: Diagnosis not present

## 2023-10-30 DIAGNOSIS — Z953 Presence of xenogenic heart valve: Secondary | ICD-10-CM

## 2023-10-30 DIAGNOSIS — I480 Paroxysmal atrial fibrillation: Secondary | ICD-10-CM

## 2023-10-30 NOTE — Progress Notes (Signed)
 Cardiology Office Note    Date:  10/30/2023  ID:  Levonte, Molina [birth year], MRN [id number] Cardiologist: Jayson Sierras, MD    History of Present Illness:    Douglas [name] is a 89 y.o. male with past medical history of paroxysmal atrial fibrillation, CAD (s/p CABG in 2013 with NST in 08/2018 showing mild to moderate ischemia), CHB (s/p Medtronic PPM placement), thoracic aortic aneurysm (s/p Bentall procedure in 2013 with AVR), HTN and HLD who presents to the office today for 1-month follow-up.  He was last examined by Rollo Louder, PA in 08/2023 after having a syncopal episode several days prior as he developed sudden dizziness and lost consciousness. He was unaware of any precipitating events. BP was low at 100/70 during the office visit and Amlodipine  was discontinued. He was encouraged to send a remote interrogation in for his PPM. A follow-up echocardiogram was also recommended to assess for any structural abnormalities.  Device interrogation did show normal device function and no episodes on 08/29/2023. Repeat echocardiogram showed a preserved EF of 55 to 60% with no regional wall motion abnormalities. RV function was low-normal and PASP was moderately elevated. His Edwards pericardial valve was functioning normally with no significant regurgitation.  In talking with the patient today, he reports overall doing well since his last office visit. Lives at home with his wife who is 33 yo. He performs ADL's independently. He denies any recurrent syncopal episodes. No recent dyspnea on exertion, chest pain, orthopnea, PND or pitting edema. Takes Lasix  20mg  every other day. Does feel an occasional skipped beat at night when going to sleep but no tachypalpitations. He did previously have issues with epistaxis in 09/2023 but denies any recurrence. Was referred to ENT at that time but has not heard back in regards to this. No reports of melena, hematochezia or hematuria.  Studies Reviewed:    EKG: EKG is not ordered today.  Echocardiogram: 08/2023 IMPRESSIONS     1. Left ventricular ejection fraction, by estimation, is 55 to 60%. The  left ventricle has normal function. The left ventricle has no regional  wall motion abnormalities. There is mild left ventricular hypertrophy.  Left ventricular diastolic function  could not be evaluated.   2. Right ventricular systolic function is low normal. The right  ventricular size is normal. There is moderately elevated pulmonary artery  systolic pressure. The estimated right ventricular systolic pressure is  46.2 mmHg.   3. Left atrial size was severely dilated.   4. Right atrial size was moderately dilated.   5. The mitral valve is degenerative. Mild mitral valve regurgitation. No  evidence of mitral stenosis. Moderate mitral annular calcification.   6. The aortic valve has been repaired/replaced. Aortic valve  regurgitation is not visualized. No aortic stenosis is present. There is a  23 mm Edwards pericardial valve present in the aortic position.   7. The inferior vena cava is dilated in size with >50% respiratory  variability, suggesting right atrial pressure of 8 mmHg.    Risk Assessment/Calculations:    CHA2DS2-VASc Score = 4   This indicates a 4.8% annual risk of stroke. The patient's score is based upon: CHF History: 0 HTN History: 1 Diabetes History: 0 Stroke History: 0 Vascular Disease History: 1 Age Score: 2 Gender Score: 0     Physical Exam:   VS:  BP 130/80   Pulse 90   Ht 5' 5 (1.651 m)   Wt 181 lb 9.6 oz (82.4 kg)   SpO2  98%   BMI 30.22 kg/m    Wt Readings from Last 3 Encounters:  10/30/23 181 lb 9.6 oz (82.4 kg)  10/22/23 190 lb (86.2 kg)  09/02/23 187 lb (84.8 kg)     GEN: Pleasant, elderly male appearing in no acute distress NECK: No JVD; No carotid bruits CARDIAC: RRR, no murmurs, rubs, gallops RESPIRATORY:  Clear to auscultation without rales, wheezing or rhonchi  ABDOMEN: Appears  non-distended. No obvious abdominal masses. EXTREMITIES: No clubbing or cyanosis. No pitting edema.  Distal pedal pulses are 2+ bilaterally.   Assessment and Plan:   1. CAD - He did undergo CABG in 2013 and most recent ischemic evaluation was a NST in 2019 which showed mild to moderate ischemia and medical management was pursued at that time.  - He remains active at baseline for his age and denies any recent anginal symptoms. Continue Toprol -XL 50 mg daily and Pravastatin  20 mg daily (HLD followed by PCP). He is not on ASA given the need for anticoagulation.  2. Paroxysmal Atrial Fibrillation - He reports occasional skipped beats but no tachypalpitations. Will continue Toprol -XL 50 mg daily for rate control. -Previously had epistaxis but no recurrence. Remains on Coumadin  for anticoagulation and INR was at 2.0 when checked in 09/2023.  3. CHB - He does have a Medtronic PPM in place which is followed by Dr. Waddell. Device interrogation in 07/2023 showed normal device function with 1 NSVT arrhythmia detected. He is scheduled for a remote check later this month.  4. Thoracic Aortic Aneurysm w/ AVR - He did undergo Bentall procedure in 2013 with AVR. Recent echocardiogram in 08/2023 showed normal valve function as reviewed above.   5. HTN - Blood pressure is well-controlled at 130/80 during today's visit. Continue current medical therapy with Toprol -XL 50 mg daily. Did check orthostatics today given prior syncope and negative today. I did recommend that he remain off Amlodipine  at this time given prior hypotension while on the medication.   Signed, Laymon CHRISTELLA Qua, PA-C

## 2023-10-30 NOTE — Patient Instructions (Addendum)
 Medication Instructions:  Your physician recommends that you continue on your current medications as directed. Please refer to the Current Medication list given to you today.  *If you need a refill on your cardiac medications before your next appointment, please call your pharmacy*   Lab Work: None If you have labs (blood work) drawn today and your tests are completely normal, you will receive your results only by: MyChart Message (if you have MyChart) OR A paper copy in the mail If you have any lab test that is abnormal or we need to change your treatment, we will call you to review the results.   Testing/Procedures: None   Follow-Up: At [facility], you and your health needs are our priority.  As part of our continuing mission to provide you with exceptional heart care, we have created designated Provider Care Teams.  These Care Teams include your primary Cardiologist (physician) and Advanced Practice Providers (APPs -  Physician Assistants and Nurse Practitioners) who all work together to provide you with the care you need, when you need it.  We recommend signing up for the patient portal called MyChart.  Sign up information is provided on this After Visit Summary.  MyChart is used to connect with patients for Virtual Visits (Telemedicine).  Patients are able to view lab/test results, encounter notes, upcoming appointments, etc.  Non-urgent messages can be sent to your provider as well.   To learn more about what you can do with MyChart, go to [email or web address].    Your next appointment:   6 month(s)  Provider:   You may see Jayson Sierras, MD or one of the following Advanced Practice Providers on your designated Care Team:   Laymon Qua, PA-C  Olivia Pavy, PA-C     Other Instructions Omioron blood pressure cuff

## 2023-11-11 ENCOUNTER — Ambulatory Visit: Payer: Medicare Other | Attending: Cardiology | Admitting: *Deleted

## 2023-11-11 DIAGNOSIS — Z5181 Encounter for therapeutic drug level monitoring: Secondary | ICD-10-CM | POA: Diagnosis not present

## 2023-11-11 DIAGNOSIS — Z952 Presence of prosthetic heart valve: Secondary | ICD-10-CM | POA: Insufficient documentation

## 2023-11-11 DIAGNOSIS — I480 Paroxysmal atrial fibrillation: Secondary | ICD-10-CM | POA: Insufficient documentation

## 2023-11-11 LAB — POCT INR: INR: 3 (ref 2.0–3.0)

## 2023-11-11 NOTE — Patient Instructions (Signed)
Continue warfarin 1 tablet daily except 1/2 tablet on Sundays Recheck in 6 weeks Continue greens

## 2023-11-14 DIAGNOSIS — H0102B Squamous blepharitis left eye, upper and lower eyelids: Secondary | ICD-10-CM | POA: Diagnosis not present

## 2023-11-14 DIAGNOSIS — H0102A Squamous blepharitis right eye, upper and lower eyelids: Secondary | ICD-10-CM | POA: Diagnosis not present

## 2023-11-19 ENCOUNTER — Ambulatory Visit: Payer: Medicare Other

## 2023-11-19 DIAGNOSIS — I442 Atrioventricular block, complete: Secondary | ICD-10-CM | POA: Diagnosis not present

## 2023-11-20 LAB — CUP PACEART REMOTE DEVICE CHECK
Battery Impedance: 2806 Ohm
Battery Remaining Longevity: 31 mo
Battery Voltage: 2.71 V
Brady Statistic RV Percent Paced: 100 %
Date Time Interrogation Session: 20250129100124
Implantable Lead Connection Status: 753985
Implantable Lead Connection Status: 753985
Implantable Lead Implant Date: 20130412
Implantable Lead Implant Date: 20130412
Implantable Lead Location: 753859
Implantable Lead Location: 753860
Implantable Lead Model: 5076
Implantable Lead Model: 5076
Implantable Pulse Generator Implant Date: 20130412
Lead Channel Impedance Value: 528 Ohm
Lead Channel Impedance Value: 67 Ohm
Lead Channel Pacing Threshold Amplitude: 0.75 V
Lead Channel Pacing Threshold Pulse Width: 0.4 ms
Lead Channel Setting Pacing Amplitude: 2 V
Lead Channel Setting Pacing Pulse Width: 0.4 ms
Lead Channel Setting Sensing Sensitivity: 4 mV
Zone Setting Status: 755011
Zone Setting Status: 755011

## 2023-11-25 ENCOUNTER — Encounter: Payer: Self-pay | Admitting: Internal Medicine

## 2023-11-26 DIAGNOSIS — L280 Lichen simplex chronicus: Secondary | ICD-10-CM | POA: Diagnosis not present

## 2023-11-26 DIAGNOSIS — C44319 Basal cell carcinoma of skin of other parts of face: Secondary | ICD-10-CM | POA: Diagnosis not present

## 2023-11-26 DIAGNOSIS — L720 Epidermal cyst: Secondary | ICD-10-CM | POA: Diagnosis not present

## 2023-11-26 DIAGNOSIS — Z85828 Personal history of other malignant neoplasm of skin: Secondary | ICD-10-CM | POA: Diagnosis not present

## 2023-11-26 DIAGNOSIS — C4401 Basal cell carcinoma of skin of lip: Secondary | ICD-10-CM | POA: Diagnosis not present

## 2023-12-04 DIAGNOSIS — Z85828 Personal history of other malignant neoplasm of skin: Secondary | ICD-10-CM | POA: Diagnosis not present

## 2023-12-04 DIAGNOSIS — C4401 Basal cell carcinoma of skin of lip: Secondary | ICD-10-CM | POA: Diagnosis not present

## 2023-12-06 ENCOUNTER — Telehealth (INDEPENDENT_AMBULATORY_CARE_PROVIDER_SITE_OTHER): Payer: Self-pay | Admitting: Otolaryngology

## 2023-12-06 ENCOUNTER — Encounter (INDEPENDENT_AMBULATORY_CARE_PROVIDER_SITE_OTHER): Payer: Self-pay

## 2023-12-06 NOTE — Telephone Encounter (Signed)
Called patient to reschedule appt currently on 12/12/2023.  Current appt is w/ Dr. Suszanne Conners for nose bleeds.  We received a 2nd referral from Dr. Ouida Sills  for this patient for dysphasia, which Dr. Suszanne Conners does not treat.  I called to reschedule the appt for both issues with one of our other providers.  Left voicemail message to call our office. Also sent a MyChart message.

## 2023-12-12 ENCOUNTER — Institutional Professional Consult (permissible substitution) (INDEPENDENT_AMBULATORY_CARE_PROVIDER_SITE_OTHER): Payer: Medicare Other

## 2023-12-19 ENCOUNTER — Inpatient Hospital Stay (HOSPITAL_COMMUNITY): Payer: Medicare Other

## 2023-12-19 ENCOUNTER — Other Ambulatory Visit: Payer: Self-pay

## 2023-12-19 ENCOUNTER — Encounter (HOSPITAL_COMMUNITY): Payer: Self-pay

## 2023-12-19 ENCOUNTER — Inpatient Hospital Stay (HOSPITAL_COMMUNITY)
Admission: EM | Admit: 2023-12-19 | Discharge: 2023-12-24 | DRG: 871 | Disposition: A | Discharge status: Home Health | Payer: Medicare Other | Attending: Internal Medicine | Admitting: Internal Medicine

## 2023-12-19 ENCOUNTER — Emergency Department (HOSPITAL_COMMUNITY): Payer: Medicare Other

## 2023-12-19 DIAGNOSIS — Z860101 Personal history of adenomatous and serrated colon polyps: Secondary | ICD-10-CM

## 2023-12-19 DIAGNOSIS — Z8774 Personal history of (corrected) congenital malformations of heart and circulatory system: Secondary | ICD-10-CM

## 2023-12-19 DIAGNOSIS — L89891 Pressure ulcer of other site, stage 1: Secondary | ICD-10-CM | POA: Diagnosis not present

## 2023-12-19 DIAGNOSIS — Z8719 Personal history of other diseases of the digestive system: Secondary | ICD-10-CM

## 2023-12-19 DIAGNOSIS — Z85828 Personal history of other malignant neoplasm of skin: Secondary | ICD-10-CM

## 2023-12-19 DIAGNOSIS — I4891 Unspecified atrial fibrillation: Secondary | ICD-10-CM | POA: Diagnosis not present

## 2023-12-19 DIAGNOSIS — J9601 Acute respiratory failure with hypoxia: Secondary | ICD-10-CM | POA: Diagnosis present

## 2023-12-19 DIAGNOSIS — N179 Acute kidney failure, unspecified: Secondary | ICD-10-CM | POA: Diagnosis not present

## 2023-12-19 DIAGNOSIS — I330 Acute and subacute infective endocarditis: Secondary | ICD-10-CM

## 2023-12-19 DIAGNOSIS — Z79899 Other long term (current) drug therapy: Secondary | ICD-10-CM | POA: Diagnosis not present

## 2023-12-19 DIAGNOSIS — M797 Fibromyalgia: Secondary | ICD-10-CM | POA: Diagnosis present

## 2023-12-19 DIAGNOSIS — Z7901 Long term (current) use of anticoagulants: Secondary | ICD-10-CM

## 2023-12-19 DIAGNOSIS — I110 Hypertensive heart disease with heart failure: Secondary | ICD-10-CM | POA: Diagnosis present

## 2023-12-19 DIAGNOSIS — M109 Gout, unspecified: Secondary | ICD-10-CM | POA: Diagnosis present

## 2023-12-19 DIAGNOSIS — R059 Cough, unspecified: Secondary | ICD-10-CM | POA: Diagnosis not present

## 2023-12-19 DIAGNOSIS — I442 Atrioventricular block, complete: Secondary | ICD-10-CM | POA: Diagnosis present

## 2023-12-19 DIAGNOSIS — R918 Other nonspecific abnormal finding of lung field: Secondary | ICD-10-CM | POA: Diagnosis not present

## 2023-12-19 DIAGNOSIS — K219 Gastro-esophageal reflux disease without esophagitis: Secondary | ICD-10-CM | POA: Diagnosis present

## 2023-12-19 DIAGNOSIS — A4181 Sepsis due to Enterococcus: Principal | ICD-10-CM | POA: Diagnosis present

## 2023-12-19 DIAGNOSIS — M353 Polymyalgia rheumatica: Secondary | ICD-10-CM | POA: Diagnosis present

## 2023-12-19 DIAGNOSIS — E66812 Obesity, class 2: Secondary | ICD-10-CM | POA: Diagnosis not present

## 2023-12-19 DIAGNOSIS — L8990 Pressure ulcer of unspecified site, unspecified stage: Secondary | ICD-10-CM | POA: Insufficient documentation

## 2023-12-19 DIAGNOSIS — J189 Pneumonia, unspecified organism: Principal | ICD-10-CM | POA: Diagnosis present

## 2023-12-19 DIAGNOSIS — Z951 Presence of aortocoronary bypass graft: Secondary | ICD-10-CM

## 2023-12-19 DIAGNOSIS — I700 Atherosclerosis of aorta: Secondary | ICD-10-CM | POA: Diagnosis not present

## 2023-12-19 DIAGNOSIS — Z1152 Encounter for screening for COVID-19: Secondary | ICD-10-CM

## 2023-12-19 DIAGNOSIS — R053 Chronic cough: Secondary | ICD-10-CM | POA: Diagnosis not present

## 2023-12-19 DIAGNOSIS — Z952 Presence of prosthetic heart valve: Secondary | ICD-10-CM

## 2023-12-19 DIAGNOSIS — R7881 Bacteremia: Secondary | ICD-10-CM | POA: Diagnosis not present

## 2023-12-19 DIAGNOSIS — Z888 Allergy status to other drugs, medicaments and biological substances status: Secondary | ICD-10-CM

## 2023-12-19 DIAGNOSIS — B952 Enterococcus as the cause of diseases classified elsewhere: Secondary | ICD-10-CM | POA: Diagnosis not present

## 2023-12-19 DIAGNOSIS — E872 Acidosis, unspecified: Secondary | ICD-10-CM | POA: Diagnosis present

## 2023-12-19 DIAGNOSIS — Z6833 Body mass index (BMI) 33.0-33.9, adult: Secondary | ICD-10-CM | POA: Diagnosis not present

## 2023-12-19 DIAGNOSIS — E785 Hyperlipidemia, unspecified: Secondary | ICD-10-CM | POA: Diagnosis present

## 2023-12-19 DIAGNOSIS — I517 Cardiomegaly: Secondary | ICD-10-CM | POA: Diagnosis not present

## 2023-12-19 DIAGNOSIS — I4821 Permanent atrial fibrillation: Secondary | ICD-10-CM | POA: Diagnosis present

## 2023-12-19 DIAGNOSIS — I2510 Atherosclerotic heart disease of native coronary artery without angina pectoris: Secondary | ICD-10-CM | POA: Diagnosis present

## 2023-12-19 DIAGNOSIS — Z953 Presence of xenogenic heart valve: Secondary | ICD-10-CM

## 2023-12-19 DIAGNOSIS — Z950 Presence of cardiac pacemaker: Secondary | ICD-10-CM | POA: Diagnosis not present

## 2023-12-19 DIAGNOSIS — Z841 Family history of disorders of kidney and ureter: Secondary | ICD-10-CM

## 2023-12-19 DIAGNOSIS — R531 Weakness: Secondary | ICD-10-CM | POA: Diagnosis not present

## 2023-12-19 DIAGNOSIS — I509 Heart failure, unspecified: Secondary | ICD-10-CM | POA: Diagnosis not present

## 2023-12-19 DIAGNOSIS — R739 Hyperglycemia, unspecified: Secondary | ICD-10-CM | POA: Diagnosis present

## 2023-12-19 DIAGNOSIS — D72829 Elevated white blood cell count, unspecified: Secondary | ICD-10-CM | POA: Diagnosis present

## 2023-12-19 DIAGNOSIS — B9689 Other specified bacterial agents as the cause of diseases classified elsewhere: Secondary | ICD-10-CM | POA: Diagnosis not present

## 2023-12-19 DIAGNOSIS — Z8249 Family history of ischemic heart disease and other diseases of the circulatory system: Secondary | ICD-10-CM | POA: Diagnosis not present

## 2023-12-19 DIAGNOSIS — R1310 Dysphagia, unspecified: Secondary | ICD-10-CM | POA: Diagnosis present

## 2023-12-19 DIAGNOSIS — I4811 Longstanding persistent atrial fibrillation: Secondary | ICD-10-CM | POA: Diagnosis not present

## 2023-12-19 DIAGNOSIS — I10 Essential (primary) hypertension: Secondary | ICD-10-CM | POA: Diagnosis present

## 2023-12-19 DIAGNOSIS — R1312 Dysphagia, oropharyngeal phase: Secondary | ICD-10-CM | POA: Diagnosis not present

## 2023-12-19 DIAGNOSIS — I5021 Acute systolic (congestive) heart failure: Secondary | ICD-10-CM | POA: Diagnosis not present

## 2023-12-19 DIAGNOSIS — I340 Nonrheumatic mitral (valve) insufficiency: Secondary | ICD-10-CM | POA: Diagnosis not present

## 2023-12-19 DIAGNOSIS — R0602 Shortness of breath: Secondary | ICD-10-CM | POA: Diagnosis not present

## 2023-12-19 LAB — CBC WITH DIFFERENTIAL/PLATELET
Abs Immature Granulocytes: 0.07 10*3/uL (ref 0.00–0.07)
Basophils Absolute: 0.1 10*3/uL (ref 0.0–0.1)
Basophils Relative: 0 %
Eosinophils Absolute: 0.1 10*3/uL (ref 0.0–0.5)
Eosinophils Relative: 1 %
HCT: 39.4 % (ref 39.0–52.0)
Hemoglobin: 12.3 g/dL — ABNORMAL LOW (ref 13.0–17.0)
Immature Granulocytes: 1 %
Lymphocytes Relative: 6 %
Lymphs Abs: 0.9 10*3/uL (ref 0.7–4.0)
MCH: 29.8 pg (ref 26.0–34.0)
MCHC: 31.2 g/dL (ref 30.0–36.0)
MCV: 95.4 fL (ref 80.0–100.0)
Monocytes Absolute: 0.8 10*3/uL (ref 0.1–1.0)
Monocytes Relative: 5 %
Neutro Abs: 13 10*3/uL — ABNORMAL HIGH (ref 1.7–7.7)
Neutrophils Relative %: 87 %
Platelets: 174 10*3/uL (ref 150–400)
RBC: 4.13 MIL/uL — ABNORMAL LOW (ref 4.22–5.81)
RDW: 15.2 % (ref 11.5–15.5)
WBC: 14.9 10*3/uL — ABNORMAL HIGH (ref 4.0–10.5)
nRBC: 0 % (ref 0.0–0.2)

## 2023-12-19 LAB — LACTIC ACID, PLASMA
Lactic Acid, Venous: 2.6 mmol/L (ref 0.5–1.9)
Lactic Acid, Venous: 2.9 mmol/L (ref 0.5–1.9)
Lactic Acid, Venous: 2.3 mmol/L (ref 0.5–1.9)
Lactic Acid, Venous: 2.4 mmol/L (ref 0.5–1.9)

## 2023-12-19 LAB — RESP PANEL BY RT-PCR (RSV, FLU A&B, COVID)  RVPGX2
Influenza A by PCR: NEGATIVE
Influenza B by PCR: NEGATIVE
Resp Syncytial Virus by PCR: NEGATIVE
SARS Coronavirus 2 by RT PCR: NEGATIVE

## 2023-12-19 LAB — BASIC METABOLIC PANEL
Anion gap: 7 (ref 5–15)
BUN: 29 mg/dL — ABNORMAL HIGH (ref 8–23)
CO2: 23 mmol/L (ref 22–32)
Calcium: 8.6 mg/dL — ABNORMAL LOW (ref 8.9–10.3)
Chloride: 105 mmol/L (ref 98–111)
Creatinine, Ser: 1.35 mg/dL — ABNORMAL HIGH (ref 0.61–1.24)
GFR, Estimated: 49 mL/min — ABNORMAL LOW (ref >60)
Glucose, Bld: 131 mg/dL — ABNORMAL HIGH (ref 70–99)
Potassium: 3.8 mmol/L (ref 3.5–5.1)
Sodium: 135 mmol/L (ref 135–145)

## 2023-12-19 LAB — PROCALCITONIN: Procalcitonin: 0.12 ng/mL

## 2023-12-19 LAB — STREP PNEUMONIAE URINARY ANTIGEN: Strep Pneumo Urinary Antigen: NEGATIVE

## 2023-12-19 LAB — PROTIME-INR
INR: 2.7 — ABNORMAL HIGH (ref 0.8–1.2)
Prothrombin Time: 28.5 s — ABNORMAL HIGH (ref 11.4–15.2)

## 2023-12-19 MED ORDER — PANTOPRAZOLE SODIUM 40 MG PO TBEC
40.0000 mg | DELAYED_RELEASE_TABLET | Freq: Every evening | ORAL | Status: DC
  Administered 2023-12-19 – 2023-12-23 (×5): 40 mg via ORAL
  Filled 2023-12-19 (×6): qty 1

## 2023-12-19 MED ORDER — DEXTROMETHORPHAN POLISTIREX ER 30 MG/5ML PO SUER: 30.0000 mg | Freq: Two times a day (BID) | ORAL | Status: DC | PRN

## 2023-12-19 MED ORDER — WARFARIN - PHARMACIST DOSING INPATIENT: Freq: Every day | Status: DC

## 2023-12-19 MED ORDER — PRAVASTATIN SODIUM 10 MG PO TABS
20.0000 mg | ORAL_TABLET | Freq: Every evening | ORAL | Status: DC
  Administered 2023-12-19 – 2023-12-23 (×5): 20 mg via ORAL
  Filled 2023-12-19 (×6): qty 2

## 2023-12-19 MED ORDER — SENNOSIDES-DOCUSATE SODIUM 8.6-50 MG PO TABS
1.0000 | ORAL_TABLET | Freq: Two times a day (BID) | ORAL | Status: DC
  Administered 2023-12-19 – 2023-12-24 (×9): 1 via ORAL
  Filled 2023-12-19 (×9): qty 1

## 2023-12-19 MED ORDER — SODIUM CHLORIDE 0.9 % IV SOLN
1.0000 g | Freq: Once | INTRAVENOUS | Status: AC
  Administered 2023-12-19: 1 g via INTRAVENOUS
  Filled 2023-12-19: qty 10

## 2023-12-19 MED ORDER — SODIUM CHLORIDE 0.9 % IV SOLN
500.0000 mg | Freq: Once | INTRAVENOUS | Status: AC
  Administered 2023-12-19: 500 mg via INTRAVENOUS
  Filled 2023-12-19: qty 5

## 2023-12-19 MED ORDER — LACTATED RINGERS IV SOLN: INTRAVENOUS | Status: DC

## 2023-12-19 MED ORDER — TRAZODONE HCL 50 MG PO TABS
25.0000 mg | ORAL_TABLET | Freq: Every evening | ORAL | Status: DC | PRN
  Administered 2023-12-22: 25 mg via ORAL
  Filled 2023-12-19: qty 1

## 2023-12-19 MED ORDER — ACETAMINOPHEN 650 MG RE SUPP: 650.0000 mg | Freq: Four times a day (QID) | RECTAL | Status: DC | PRN

## 2023-12-19 MED ORDER — LACTATED RINGERS IV BOLUS
500.0000 mL | Freq: Once | INTRAVENOUS | Status: AC
  Administered 2023-12-19: 500 mL via INTRAVENOUS

## 2023-12-19 MED ORDER — ALLOPURINOL 300 MG PO TABS
300.0000 mg | ORAL_TABLET | Freq: Every day | ORAL | Status: DC
  Administered 2023-12-19 – 2023-12-24 (×6): 300 mg via ORAL
  Filled 2023-12-19 (×6): qty 1

## 2023-12-19 MED ORDER — LACTATED RINGERS IV BOLUS (SEPSIS)
1000.0000 mL | Freq: Once | INTRAVENOUS | Status: AC
  Administered 2023-12-19: 1000 mL via INTRAVENOUS

## 2023-12-19 MED ORDER — LACTATED RINGERS IV BOLUS: 1000.0000 mL | Freq: Once | INTRAVENOUS | Status: DC

## 2023-12-19 MED ORDER — ONDANSETRON HCL 4 MG PO TABS: 4.0000 mg | ORAL_TABLET | Freq: Four times a day (QID) | ORAL | Status: DC | PRN

## 2023-12-19 MED ORDER — FENTANYL CITRATE PF 50 MCG/ML IJ SOSY: 12.5000 ug | PREFILLED_SYRINGE | INTRAMUSCULAR | Status: DC | PRN

## 2023-12-19 MED ORDER — CEFTRIAXONE SODIUM 2 G IJ SOLR: 2.0000 g | INTRAMUSCULAR | Status: DC

## 2023-12-19 MED ORDER — LACTATED RINGERS IV SOLN: INTRAVENOUS | Status: AC

## 2023-12-19 MED ORDER — METOPROLOL SUCCINATE ER 50 MG PO TB24
50.0000 mg | ORAL_TABLET | Freq: Every day | ORAL | Status: DC
  Administered 2023-12-19 – 2023-12-24 (×6): 50 mg via ORAL
  Filled 2023-12-19 (×6): qty 1

## 2023-12-19 MED ORDER — OXYCODONE HCL 5 MG PO TABS
5.0000 mg | ORAL_TABLET | ORAL | Status: DC | PRN
  Administered 2023-12-20: 5 mg via ORAL
  Filled 2023-12-19: qty 1

## 2023-12-19 MED ORDER — ACETAMINOPHEN 325 MG PO TABS
650.0000 mg | ORAL_TABLET | Freq: Four times a day (QID) | ORAL | Status: DC | PRN
  Administered 2023-12-19 – 2023-12-24 (×4): 650 mg via ORAL
  Filled 2023-12-19 (×4): qty 2

## 2023-12-19 MED ORDER — AZITHROMYCIN 250 MG PO TABS
500.0000 mg | ORAL_TABLET | Freq: Every day | ORAL | Status: AC
  Administered 2023-12-20 – 2023-12-23 (×4): 500 mg via ORAL
  Filled 2023-12-19 (×4): qty 2

## 2023-12-19 MED ORDER — IPRATROPIUM-ALBUTEROL 0.5-2.5 (3) MG/3ML IN SOLN
3.0000 mL | Freq: Once | RESPIRATORY_TRACT | Status: AC
  Administered 2023-12-19: 3 mL via RESPIRATORY_TRACT
  Filled 2023-12-19: qty 3

## 2023-12-19 MED ORDER — WARFARIN SODIUM 5 MG PO TABS
5.0000 mg | ORAL_TABLET | Freq: Once | ORAL | Status: AC
  Administered 2023-12-19: 5 mg via ORAL
  Filled 2023-12-19: qty 1

## 2023-12-19 MED ORDER — IPRATROPIUM-ALBUTEROL 0.5-2.5 (3) MG/3ML IN SOLN: 3.0000 mL | RESPIRATORY_TRACT | Status: DC | PRN

## 2023-12-19 MED ORDER — GUAIFENESIN ER 600 MG PO TB12
600.0000 mg | ORAL_TABLET | Freq: Two times a day (BID) | ORAL | Status: AC
  Administered 2023-12-19 – 2023-12-23 (×10): 600 mg via ORAL
  Filled 2023-12-19 (×10): qty 1

## 2023-12-19 MED ORDER — BISACODYL 5 MG PO TBEC: 5.0000 mg | DELAYED_RELEASE_TABLET | Freq: Every day | ORAL | Status: DC | PRN

## 2023-12-19 MED ORDER — ONDANSETRON HCL 4 MG/2ML IJ SOLN
4.0000 mg | Freq: Four times a day (QID) | INTRAMUSCULAR | Status: AC | PRN
Start: 2023-12-19 — End: ?

## 2023-12-19 NOTE — Progress Notes (Signed)
 PHARMACY - ANTICOAGULATION CONSULT NOTE  Pharmacy Consult for warfarin Indication: atrial fibrillation  Allergies  Allergen Reactions   Imdur [Isosorbide Nitrate] Other (See Comments)    Nausea and lightheaded     Patient Measurements: Height: 5\' 6"  (167.6 cm) Weight: 88.5 kg (195 lb) IBW/kg (Calculated) : 63.8 Heparin Dosing Weight:   Vital Signs: Temp: 98.1 F (36.7 C) (02/27 0509) Temp Source: Oral (02/27 0509) BP: 126/63 (02/27 0615) Pulse Rate: 72 (02/27 0615)  Labs: Recent Labs    12/19/23 0531  HGB 12.3*  HCT 39.4  PLT 174  LABPROT 28.5*  INR 2.7*  CREATININE 1.35*    Estimated Creatinine Clearance: 34.9 mL/min (A) (by C-G formula based on SCr of 1.35 mg/dL (H)).   Medical History: Past Medical History:  Diagnosis Date   Acute upper GI bleed 05/18/2016   Aneurysm of thoracic aorta (HCC)    Ascending. 5.7 x 5.5cm - Bentall procedure   Aortic stenosis    Bioprosthetic AVR   Arthritis    Atrial fibrillation (HCC)    Documented on pacer interrogation 6/13   Complete heart block (HCC)    Status post pacemaker placement   Coronary atherosclerosis of native coronary artery    Multivessel - LIMA to LAD, SVG to OM, SVG to PDA   DJD (degenerative joint disease)    Esophageal dysmotility    Essential hypertension    Fibromyalgia    Gout    H/O arteriovenous malformation (AVM)    H/O hiatal hernia    Hemorrhoid    History of esophageal ulcer    History of skin cancer    Hx of adenomatous colonic polyps    Iron deficiency anemia due to chronic blood loss    Melena    Pacemaker-Medtronic    PMR (polymyalgia rheumatica) (HCC)    SIRS (systemic inflammatory response syndrome) (HCC) 01/16/2015    Medications:  (Not in a hospital admission)   Assessment: Pharmacy consulted to dose warfarin in patient with atrial fibrillation.  Patient's home dose listed as 2.5 mg on Sun and 5 mg ROW (anti-coag visit note) with last dose 2/26.  INR on admission is  therapeutic at 2.7.  CBC WNL  Goal of Therapy:  INR 2-3 Monitor platelets by anticoagulation protocol: Yes   Plan:  Warfarin 5 mg x 1 dose. Monitor daily INR and s/s of bleeding.  Judeth Cornfield, PharmD Clinical Pharmacist 12/19/2023 8:05 AM

## 2023-12-19 NOTE — TOC CM/SW Note (Signed)
 Transition of Care [facility]) - Inpatient Brief Assessment   Patient Details  Name: Douglas [name] MRN: [id number] Date of Birth: [birth year]  Transition of [facility]) CM/SW Contact:    Isabella Bowens, LCSWA Phone Number: 12/19/2023, 3:35 PM   Clinical Narrative:  Transition of Care Department [facility]) has reviewed patient and no TOC needs have been identified at this time. We will continue to monitor patient advancement through interdisciplinary progression rounds. If new patient transition needs arise, please place a TOC consult.   Transition of Care Asessment: Insurance and Status: Insurance coverage has been reviewed Patient has primary care physician: Yes Home environment has been reviewed: Single Family Home wiht spouse Prior level of function:: Independent Prior/Current Home Services: No current home services Social Drivers of Health Review: SDOH reviewed no interventions necessary Readmission risk has been reviewed: Yes Transition of care needs: no transition of care needs at this time

## 2023-12-19 NOTE — ED Notes (Signed)
Urinal given to patient.

## 2023-12-19 NOTE — ED Provider Notes (Signed)
 [place] [facility]GENC[facility] DEPARTMENT AT [facility] Provider Note   CSN: [id number] Arrival date & time: 12/19/23  6962     History  Chief Complaint  Patient presents with   Cough   Chills   Shortness of Breath    Douglas [name] is a 89 y.o. male.  The history is provided by the patient and a relative.  Patient with extensive history including CAD, CHB with pacemaker in place, thoracic aneurysm (s/p bentall procedure with AVR on coumadin) hypertension presents with flulike illness. Patient reports has had increasing cough chills fever.  He also reports body aches and generalized weakness.  He is also had headache and sore throat. He had been at his baseline yesterday working on the tractor outside, patient is very active for his age.  Patient now reports generalized weakness and difficulty getting around    Home Medications Prior to Admission medications   Medication Sig Start Date End Date Taking? Authorizing Provider  acetaminophen (TYLENOL) 500 MG tablet Take 1,000 mg by mouth every 6 (six) hours as needed for mild pain.    [provider]  allopurinol (ZYLOPRIM) 300 MG tablet Take 300 mg by mouth daily. Take 1  daily    [provider]  amoxicillin (AMOXIL) 500 MG capsule Take 500 mg by mouth. As need for dental work.    [provider]  furosemide (LASIX) 40 MG tablet Take 20 mg by mouth every other day.    [provider]  metoprolol succinate (TOPROL-XL) 25 MG 24 hr tablet TAKE (2) TABLETS BY MOUTH DAILY. 10/17/23   Jonelle Sidle, MD  nitroGLYCERIN (NITROSTAT) 0.4 MG SL tablet Place 0.4 mg under the tongue every 5 (five) minutes x 3 doses as needed for chest pain (if no relief after 2nd dose, proceed to the ED for an evaluation or call 911).    [provider]  oxymetazoline (AFRIN) 0.05 % nasal spray Place 1 spray into both nostrils as needed (nose bleed). 10/22/23   Coral Spikes, DO  pantoprazole (PROTONIX) 40 MG  tablet TAKE ONE TABLET BY MOUTH ONCE DAILY IN THE MORNING. 11/24/15   Jonelle Sidle, MD  pravastatin (PRAVACHOL) 20 MG tablet Take 20 mg by mouth every evening.  10/27/15   [provider]  sennosides-docusate sodium (SENOKOT-S) 8.6-50 MG tablet Take 1 tablet by mouth 2 (two) times daily. 12/30/17   Malissa Hippo, MD  warfarin (COUMADIN) 5 MG tablet TAKE 1 TABLET BY MOUTH ONCE DAILY OR AS DIRECTED. 10/01/23   Jonelle Sidle, MD      Allergies    Imdur [isosorbide nitrate]    Review of Systems   Review of Systems  Constitutional:  Positive for chills and fatigue.  Gastrointestinal:  Positive for nausea.    Physical Exam Updated Vital Signs BP 126/63   Pulse 72   Temp 98.1 F (36.7 C) (Oral)   Resp (!) 29   Ht 1.676 m (5\' 6" )   Wt 88.5 kg   SpO2 100%   BMI 31.47 kg/m  Physical Exam CONSTITUTIONAL: Elderly, ill-appearing HEAD: Normocephalic/atraumatic EYES: EOMI/PERRL ENMT: Mucous membranes moist, uvula midline, mild erythema without exudates, no stridor or drooling, no angioedema NECK: supple no meningeal signs CV: S1/S2 noted, murmur noted LUNGS: Mild tachypnea, crackles in the left base ABDOMEN: soft, nontender, GU:no cva tenderness NEURO: Pt is awake/alert/appropriate, moves all extremitiesx4.  No facial droop.   EXTREMITIES: pulses normal/equal, full ROM, chronic edema to bilateral lower extremities SKIN:  warm, color normal  ED Results / Procedures / Treatments   Labs (all labs ordered are listed, but only abnormal results are displayed) Labs Reviewed  LACTIC ACID, PLASMA - Abnormal; Notable for the following components:      Result Value   Lactic Acid, Venous 2.6 (*)    All other components within normal limits  CBC WITH DIFFERENTIAL/PLATELET - Abnormal; Notable for the following components:   WBC 14.9 (*)    RBC 4.13 (*)    Hemoglobin 12.3 (*)    Neutro Abs 13.0 (*)    All other components within normal limits  PROTIME-INR - Abnormal; Notable  for the following components:   Prothrombin Time 28.5 (*)    INR 2.7 (*)    All other components within normal limits  BASIC METABOLIC PANEL - Abnormal; Notable for the following components:   Glucose, Bld 131 (*)    BUN 29 (*)    Creatinine, Ser 1.35 (*)    Calcium 8.6 (*)    GFR, Estimated 49 (*)    All other components within normal limits  RESP PANEL BY RT-PCR (RSV, FLU A&B, COVID)  RVPGX2  CULTURE, BLOOD (ROUTINE X 2)  CULTURE, BLOOD (ROUTINE X 2)  LACTIC ACID, PLASMA    EKG  ED ECG REPORT   Date: 12/19/2023 0637am  Rate: 72  Rhythm: normal sinus rhythm  QRS Axis: left  Intervals: normal  ST/T Wave abnormalities: normal  Conduction Disutrbances: paced rhythm  Narrative Interpretation:   Old EKG Reviewed: unchanged  I have personally reviewed the EKG tracing and agree with the computerized printout as noted.   Radiology DG Chest Portable 1 View Result Date: 12/19/2023 CLINICAL DATA:  Persistent cough EXAM: PORTABLE CHEST 1 VIEW COMPARISON:  08/29/2023 FINDINGS: Unchanged left chest wall pacer. Previous median sternotomy and aortic valve repair. Aortic atherosclerotic calcifications. Stable cardiomediastinal contours. Retrocardiac opacity with indistinct left hemidiaphragm is identified. Blunting of the left costophrenic angle. Mild patchy opacities identified in the right lower lung. Visualized osseous structures are unremarkable. IMPRESSION: 1. Retrocardiac opacity with indistinct left hemidiaphragm. Findings may reflect atelectasis and/or pneumonia. 2. Mild patchy opacities in the right lower lung. 3. Blunting of left costophrenic angle which may reflect chronic pleuroparenchymal scarring versus small effusion. Electronically Signed   By: Signa Kell M.D.   On: 12/19/2023 06:12    Procedures .Critical Care  Performed by: Zadie Rhine, MD Authorized by: Zadie Rhine, MD   Critical care provider statement:    Critical care time (minutes):  45   Critical  care start time:  12/19/2023 6:00 AM   Critical care end time:  12/19/2023 6:45 AM   Critical care time was exclusive of:  Separately billable procedures and treating other patients   Critical care was necessary to treat or prevent imminent or life-threatening deterioration of the following conditions:  Sepsis and respiratory failure   Critical care was time spent personally by me on the following activities:  Obtaining history from patient or surrogate, examination of patient, evaluation of patient's response to treatment, re-evaluation of patient's condition, pulse oximetry, review of old charts, ordering and review of radiographic studies, ordering and review of laboratory studies, ordering and performing treatments and interventions and development of treatment plan with patient or surrogate   I assumed direction of critical care for this patient from another provider in my specialty: no       Medications Ordered in ED Medications  cefTRIAXone (ROCEPHIN) 1 g in sodium chloride 0.9 % 100 mL  IVPB (1 g Intravenous New Bag/Given 12/19/23 0640)  azithromycin (ZITHROMAX) 500 mg in sodium chloride 0.9 % 250 mL IVPB (has no administration in time range)  lactated ringers infusion (0 mLs Intravenous Hold 12/19/23 0649)  lactated ringers bolus 1,000 mL (1,000 mLs Intravenous New Bag/Given 12/19/23 0649)    And  lactated ringers bolus 1,000 mL (has no administration in time range)    And  lactated ringers bolus 1,000 mL (has no administration in time range)  ipratropium-albuterol (DUONEB) 0.5-2.5 (3) MG/3ML nebulizer solution 3 mL (3 mLs Nebulization Given 12/19/23 4696)    ED Course/ Medical Decision Making/ A&P Clinical Course as of 12/19/23 0704  Thu Dec 19, 2023  0627 Patient presents with cough congestion fevers and chills.  Patient appeared to have generalized weakness and frequent coughing during exam and abnormal lung sounds.  Viral screen has been negative.  However given x-ray findings and  leukocytosis, plan to admit to the hospital. [DW]  [phone] Currently patient has no oxygen requirement but he is tachypneic, and after a DuoNeb he now has wheezing bilaterally without any history of COPD and he is not a smoker [DW]  0643 WBC(!): 14.9 Leukocytosis [DW]  0643 Creatinine(!): 1.35 Renal insufficiency [DW]  0703 Signed out to Dr. Rhae Hammock at shift change to call report [DW]    Clinical Course User Index [DW] Zadie Rhine, MD                                 Medical Decision Making Amount and/or Complexity of Data Reviewed Labs: ordered. Decision-making details documented in ED Course. Radiology: ordered.  Risk Prescription drug management. Decision regarding hospitalization.   This patient presents to the ED for concern of cough and shortness of breath, this involves an extensive number of treatment options, and is a complaint that carries with it a high risk of complications and morbidity.  The differential diagnosis includes but is not limited to Acute coronary syndrome, pneumonia, acute pulmonary edema, pneumothorax, acute anemia, pulmonary embolism, COVID-19, influenza   Comorbidities that complicate the patient evaluation: Patient's presentation is complicated by their history of CAD, heart block, AVR  Additional history obtained: Additional history obtained from family Records reviewed  cardiology notes  Lab Tests: I Ordered, and personally interpreted labs.  The pertinent results include: Leukocytosis, elevated lactate consistent with sepsis  Imaging Studies ordered: I ordered imaging studies including X-ray chest   I independently visualized and interpreted imaging which showed pneumonia I agree with the radiologist interpretation  Cardiac Monitoring: The patient was maintained on a cardiac monitor.  I personally viewed and interpreted the cardiac monitor which showed an underlying rhythm of:   paced rhythm  Medicines ordered and prescription drug  management: I ordered medication including Rocephin and azithromycin for pneumonia Reevaluation of the patient after these medicines showed that the patient    stayed the same  Critical Interventions:   admission and IV antibiotics  Consultations Obtained: I requested consultation with the admitting physician triad hospitalist , and discussed  findings as well as pertinent plan - they recommend: pending at signout  Reevaluation: After the interventions noted above, I reevaluated the patient and found that they have :stayed the same  Complexity of problems addressed: Patient's presentation is most consistent with  acute presentation with potential threat to life or bodily function  Disposition: After consideration of the diagnostic results and the patient's response to treatment,  I feel that  the patent would benefit from admission   .           Final Clinical Impression(s) / ED Diagnoses Final diagnoses:  Community acquired pneumonia of left lower lobe of lung    Rx / DC Orders ED Discharge Orders     None         Zadie Rhine, MD 12/19/23 [phone]

## 2023-12-19 NOTE — Progress Notes (Signed)
 Date and time results received: 12/19/23 1430 (use smartphrase ".now" to insert current time)  Test: Lactic Critical Value: 2.3  Name of Provider Notified:  Laural Benes  Orders Received? Or Actions Taken?:  Awaiting new orders if any

## 2023-12-19 NOTE — ED Triage Notes (Signed)
 Patient come in POV from home, complaint Been having chills and cough that is nonproductive. Shortness of breath from coughing, No fevers, stated "cough just started yesterday"

## 2023-12-19 NOTE — Hospital Course (Signed)
 89 year old male with history of Afib on warfarin, s/p AVR, CAD, HTN, h/o AVM, h/o esophageal ulcer, h/o CHB with pacemaker in place, GERD who presented to ED by home complaining of cough, chills and SOB that started yesterday.  He denies having chest pain.  He reported overall malaise and weakness.  He has no known sick contacts.  He denies having chest pain symptoms.  He reports that he is fairly active at his age and was working outside on a tractor yesterday.  He has flu like symptoms.  He has a headache and a sore throat.  On arrival he was treated for sepsis due to pneumonia with leukocytosis and abnormal chest xray.  He had abnormal lung sounds and admission was requested for further management.

## 2023-12-19 NOTE — Progress Notes (Signed)
 Elink is following code sepsis.

## 2023-12-19 NOTE — ED Notes (Signed)
 Ice Chip given per ok by provider.

## 2023-12-19 NOTE — H&P (Signed)
 History and Physical  [facility]  Douglas [name] JWJ:[id number] DOB: [birth year] DOA: 12/19/2023  PCP: Carylon Perches, MD  Patient coming from: Home by POV  Level of care: Med-Surg  I have personally briefly reviewed patient's old medical records in [facility]  Chief Complaint: Chills   HPI: Douglas [name] is a 89 year old male with history of Afib on warfarin, s/p AVR, CAD, HTN, h/o AVM, h/o esophageal ulcer, h/o CHB with pacemaker in place, GERD who presented to ED by home complaining of cough, chills and SOB that started yesterday.  He denies having chest pain.  He reported overall malaise and weakness.  He has no known sick contacts.  He denies having chest pain symptoms.  He reports that he is fairly active at his age and was working outside on a tractor yesterday.  He has flu like symptoms.  He has a headache and a sore throat.  On arrival he was treated for sepsis due to pneumonia with leukocytosis and abnormal chest xray.  He had abnormal lung sounds and admission was requested for further management.    Past Medical History:  Diagnosis Date   Acute upper GI bleed 05/18/2016   Aneurysm of thoracic aorta (HCC)    Ascending. 5.7 x 5.5cm - Bentall procedure   Aortic stenosis    Bioprosthetic AVR   Arthritis    Atrial fibrillation (HCC)    Documented on pacer interrogation 6/13   Complete heart block (HCC)    Status post pacemaker placement   Coronary atherosclerosis of native coronary artery    Multivessel - LIMA to LAD, SVG to OM, SVG to PDA   DJD (degenerative joint disease)    Esophageal dysmotility    Essential hypertension    Fibromyalgia    Gout    H/O arteriovenous malformation (AVM)    H/O hiatal hernia    Hemorrhoid    History of esophageal ulcer    History of skin cancer    Hx of adenomatous colonic polyps    Iron deficiency anemia due to chronic blood loss    Melena    Pacemaker-Medtronic    PMR (polymyalgia rheumatica) (HCC)    SIRS (systemic  inflammatory response syndrome) (HCC) 01/16/2015    Past Surgical History:  Procedure Laterality Date   AORTIC VALVE REPLACEMENT  01/24/12   APPENDECTOMY     BACK SURGERY     1965 or 1966 lumbar back surgery   BENTALL PROCEDURE  01/24/2012   Procedure: BENTALL PROCEDURE;  Surgeon: Kerin Perna, MD;  Location: [facility] OR;  Service: Open Heart Surgery;  Laterality: N/A;   BILATERAL KNEE ARTHROSCOPY     CATARACT EXTRACTION W/PHACO Left 12/03/2016   Procedure: CATARACT EXTRACTION PHACO AND INTRAOCULAR LENS PLACEMENT LEFT EYE;  Surgeon: Gemma Payor, MD;  Location: [facility];  Service: Ophthalmology;  Laterality: Left;  CDE:  10.87   CATARACT EXTRACTION W/PHACO Right 12/13/2016   Procedure: CATARACT EXTRACTION PHACO AND INTRAOCULAR LENS PLACEMENT (IOC);  Surgeon: Gemma Payor, MD;  Location: [facility];  Service: Ophthalmology;  Laterality: Right;  right cde 11.78   CORONARY ARTERY BYPASS GRAFT  01/24/2012   Procedure: CORONARY ARTERY BYPASS GRAFTING (CABG);  Surgeon: Kerin Perna, MD;  Location: [facility] OR;  Service: Open Heart Surgery;  Laterality: N/A;   ESOPHAGOGASTRODUODENOSCOPY  06/17/2012   Procedure: ESOPHAGOGASTRODUODENOSCOPY (EGD);  Surgeon: Malissa Hippo, MD;  Location: [facility];  Service: Endoscopy;  Laterality: N/A;  ED   ESOPHAGOGASTRODUODENOSCOPY N/A 05/19/2016  Procedure: ESOPHAGOGASTRODUODENOSCOPY (EGD);  Surgeon: West Bali, MD;  Location: [facility];  Service: Endoscopy;  Laterality: N/A;   HEMORROIDECTOMY     HERNIA REPAIR     Left inguinal hernia repair     PACEMAKER PLACEMENT     Medtronic 4/13 - Dr. Johney Frame   PERMANENT PACEMAKER INSERTION N/A 02/01/2012   Procedure: PERMANENT PACEMAKER INSERTION;  Surgeon: Hillis Range, MD; Medtronic Adapta L model ADDRL 1 (serial number [id number])       reports that he has never smoked. He has never used smokeless tobacco. He reports that he does not drink alcohol and does not use drugs.  Allergies  Allergen Reactions   Imdur [Isosorbide  Nitrate] Other (See Comments)    Nausea and lightheaded     Family History  Problem Relation Age of Onset   Kidney disease Mother    Heart disease Father    Anesthesia problems Neg Hx    Hypotension Neg Hx    Malignant hyperthermia Neg Hx    Pseudochol deficiency Neg Hx     Prior to Admission medications   Medication Sig Start Date End Date Taking? Authorizing Provider  acetaminophen (TYLENOL) 500 MG tablet Take 1,000 mg by mouth every 6 (six) hours as needed for mild pain.   Yes [provider]  allopurinol (ZYLOPRIM) 300 MG tablet Take 300 mg by mouth daily. Take 1  daily   Yes [provider]  furosemide (LASIX) 20 MG tablet Take 20 mg by mouth every other day. 11/25/23  Yes [provider]  metoprolol succinate (TOPROL-XL) 25 MG 24 hr tablet TAKE (2) TABLETS BY MOUTH DAILY. 10/17/23  Yes Jonelle Sidle, MD  pantoprazole (PROTONIX) 40 MG tablet TAKE ONE TABLET BY MOUTH ONCE DAILY IN THE MORNING. 11/24/15  Yes Jonelle Sidle, MD  pravastatin (PRAVACHOL) 20 MG tablet Take 20 mg by mouth every evening.  10/27/15  Yes [provider]  sennosides-docusate sodium (SENOKOT-S) 8.6-50 MG tablet Take 1 tablet by mouth 2 (two) times daily. 12/30/17  Yes Rehman, Joline Maxcy, MD  warfarin (COUMADIN) 5 MG tablet TAKE 1 TABLET BY MOUTH ONCE DAILY OR AS DIRECTED. Patient taking differently: Take 5 mg by mouth daily. Or as directed 10/01/23  Yes Jonelle Sidle, MD  trimethoprim-polymyxin b (POLYTRIM) ophthalmic solution Place 1 drop into the left eye 4 (four) times daily. Patient not taking: Reported on 12/19/2023 10/24/23   [provider]    Physical Exam: Vitals:   12/19/23 1245 12/19/23 1300 12/19/23 1311 12/19/23 1331  BP: (!) 148/90 (!) 143/91  (!) 160/92  Pulse: 71 71  75  Resp: (!) 33 (!) 30  20  Temp:   98.1 F (36.7 C) 98.1 F (36.7 C)  TempSrc:   Oral Oral  SpO2: 99% 99%  99%  Weight:      Height:        Constitutional: NAD, calm,  comfortable Eyes: PERRL, lids and conjunctivae normal ENMT: Mucous membranes are moist. Posterior pharynx clear of any exudate or lesions.Normal dentition.  Neck: normal, supple, no masses, no thyromegaly Respiratory: rales heard RLL. No crackles heard.  Cardiovascular: normal s1, s2 sounds, valve click heard.  1+ nonpitting extremity edema. 2+ pedal pulses. No carotid bruits.  Abdomen: no tenderness, no masses palpated. No hepatosplenomegaly. Bowel sounds positive.  Musculoskeletal: no clubbing / cyanosis. No joint deformity upper and lower extremities. Good ROM, no contractures. Normal muscle tone.  Skin: no rashes, lesions, ulcers. No induration Neurologic: CN  2-12 grossly intact. Sensation intact, DTR normal. Strength 5/5 in all 4.  Psychiatric: Normal judgment and insight. Alert and oriented x 3. Normal mood.   Labs on Admission: I have personally reviewed following labs and imaging studies  CBC: Recent Labs  Lab 12/19/23 0531  WBC 14.9*  NEUTROABS 13.0*  HGB 12.3*  HCT 39.4  MCV 95.4  PLT 174   Basic Metabolic Panel: Recent Labs  Lab 12/19/23 0531  NA 135  K 3.8  CL 105  CO2 23  GLUCOSE 131*  BUN 29*  CREATININE 1.35*  CALCIUM 8.6*   GFR: Estimated Creatinine Clearance: 34.9 mL/min (A) (by C-G formula based on SCr of 1.35 mg/dL (H)). Liver Function Tests: No results for input(s): "AST", "ALT", "ALKPHOS", "BILITOT", "PROT", "ALBUMIN" in the last 168 hours. No results for input(s): "LIPASE", "AMYLASE" in the last 168 hours. No results for input(s): "AMMONIA" in the last 168 hours. Coagulation Profile: Recent Labs  Lab 12/19/23 0531  INR 2.7*   Cardiac Enzymes: No results for input(s): "CKTOTAL", "CKMB", "CKMBINDEX", "TROPONINI" in the last 168 hours. BNP (last 3 results) No results for input(s): "PROBNP" in the last 8760 hours. HbA1C: No results for input(s): "HGBA1C" in the last 72 hours. CBG: No results for input(s): "GLUCAP" in the last 168  hours. Lipid Profile: No results for input(s): "CHOL", "HDL", "LDLCALC", "TRIG", "CHOLHDL", "LDLDIRECT" in the last 72 hours. Thyroid Function Tests: No results for input(s): "TSH", "T4TOTAL", "FREET4", "T3FREE", "THYROIDAB" in the last 72 hours. Anemia Panel: No results for input(s): "VITAMINB12", "FOLATE", "FERRITIN", "TIBC", "IRON", "RETICCTPCT" in the last 72 hours. Urine analysis:    Component Value Date/Time   COLORURINE YELLOW 01/16/2015 2103   APPEARANCEUR HAZY (A) 01/16/2015 2103   LABSPEC 1.020 01/16/2015 2103   PHURINE 5.5 01/16/2015 2103   GLUCOSEU NEGATIVE 01/16/2015 2103   HGBUR TRACE (A) 01/16/2015 2103   BILIRUBINUR NEGATIVE 01/16/2015 2103   KETONESUR NEGATIVE 01/16/2015 2103   PROTEINUR TRACE (A) 01/16/2015 2103   UROBILINOGEN 0.2 01/16/2015 2103   NITRITE NEGATIVE 01/16/2015 2103   LEUKOCYTESUR MODERATE (A) 01/16/2015 2103    Radiological Exams on Admission: DG CHEST PORT 1 VIEW Result Date: 12/19/2023 CLINICAL DATA:  Cough. EXAM: PORTABLE CHEST 1 VIEW COMPARISON:  Chest radiograph dated 12/19/2023 at 5:34 a.m. FINDINGS: Patient is rotated to the left. Stable mild cardiomegaly. Prior median sternotomy and aortic valve repair. Stable left chest wall pacemaker in place. Similar retrocardiac opacity and mild patchy right basilar opacities. No pneumothorax identified. No acute osseous abnormality. IMPRESSION: 1. Similar retrocardiac and mild patchy right basilar opacities could reflect atelectasis or infiltrate. 2. Similar blunting of the left costophrenic angle could reflect scarring or a small effusion. Electronically Signed   By: Hart Robinsons M.D.   On: 12/19/2023 11:49   DG Chest Portable 1 View Result Date: 12/19/2023 CLINICAL DATA:  Persistent cough EXAM: PORTABLE CHEST 1 VIEW COMPARISON:  08/29/2023 FINDINGS: Unchanged left chest wall pacer. Previous median sternotomy and aortic valve repair. Aortic atherosclerotic calcifications. Stable cardiomediastinal  contours. Retrocardiac opacity with indistinct left hemidiaphragm is identified. Blunting of the left costophrenic angle. Mild patchy opacities identified in the right lower lung. Visualized osseous structures are unremarkable. IMPRESSION: 1. Retrocardiac opacity with indistinct left hemidiaphragm. Findings may reflect atelectasis and/or pneumonia. 2. Mild patchy opacities in the right lower lung. 3. Blunting of left costophrenic angle which may reflect chronic pleuroparenchymal scarring versus small effusion. Electronically Signed   By: Signa Kell M.D.   On: 12/19/2023 06:12  Assessment/Plan Principal Problem:   CAP (community acquired pneumonia) Active Problems:   Essential hypertension, benign   Coronary atherosclerosis of native coronary artery   Atrial fibrillation (HCC)   Pacemaker-Medtronic   Aortic valve replaced   Chronic anticoagulation - Coumadin, CHADS2VASC=5   History of esophageal ulcer   H/O arteriovenous malformation (AVM)   Oropharyngeal dysphagia   AKI (acute kidney injury) (HCC)   Leukocytosis   Lactic acidosis   Generalized weakness   Hyperglycemia   Sepsis due to pneumonia - continue IV fluid resuscitation - trend and treat elevated lactic acid levels - treating pneumonia with broad spectrum antibiotics - check procalcitonin - follow blood cultures - continue other supportive measures as ordered  CAP - continue ceftriaxone and azithromycin as ordered - continue supportive measures - monitor CXR with clinical progress  History of Complete heart block s/p pacemaker - pacemaker in place - he is followed closely by cardiology outpatient  Leukocytosis  - secondary to pneumonia - follow CBC with differential   Lactic acidosis - continue IV fluid resuscitation - trend lactic acid   Atrial fibrillation  - he is fully anticoagulated with warfarin  - resume home metoprolol succinate 25 mg daily   Hyperlipidemia - resume home  pravastatin  Gout/hyperuricemia - resume home allopurinol  GERD - resume pantoprazole 40 mg daily  Generalized Weakness - PT eval in next 1-2 days   DVT prophylaxis: warfarin   Code Status: Full   Family Communication:   Disposition Plan:   Consults called:   Admission status: INP   Level of care: Med-Surg Standley Dakins MD [facility] How to contact the [facility] Attending or Consulting provider 7A - 7P or covering provider during after hours 7P -7A, for this patient?  Check the care team in [facility] and look for a) attending/consulting [facility] provider listed and b) the [facility] team listed Log into [email or web address] and use [place] universal password to access. If you do not have the password, please contact the hospital operator. Locate the [facility] provider you are looking for under [facility] and page to a number that you can be directly reached. If you still have difficulty reaching the provider, please page the [facility] (Director on Call) for the Hospitalists listed on amion for assistance.   If 7PM-7AM, please contact night-coverage [email or web address] Password [email or web address]  12/19/2023, 1:34 PM

## 2023-12-19 NOTE — ED Notes (Signed)
 ED TO INPATIENT HANDOFF REPORT  ED Nurse Name and Phone #: Haig Prophet. RN   S Name/Age/Gender Douglas [name] 89 y.o. male Room/Bed: APA11/APA11  Code Status   Code Status: Prior  Home/SNF/Other Home Patient oriented to: self, place, time, and situation Is this baseline? Yes   Triage Complete: Triage complete  Chief Complaint CAP (community acquired pneumonia) [J18.9]  Triage Note Patient come in POV from home, complaint Been having chills and cough that is nonproductive. Shortness of breath from coughing, No fevers, stated "cough just started yesterday"   Allergies Allergies  Allergen Reactions   Imdur [Isosorbide Nitrate] Other (See Comments)    Nausea and lightheaded     Level of Care/Admitting Diagnosis ED Disposition     ED Disposition  Admit   Condition  --   Comment  Hospital Area: [facility] [[id number]]  Level of Care: Med-Surg [16]  Covid Evaluation: Asymptomatic - no recent exposure (last 10 days) testing not required  Diagnosis: CAP (community acquired pneumonia) [161096]  Admitting Physician: Cleora Fleet [[id number]]  Attending Physician: Cleora Fleet [[id number]]  Certification:: I certify this patient will need inpatient services for at least 2 midnights  Expected Medical Readiness: 12/23/2023          B Medical/Surgery History Past Medical History:  Diagnosis Date   Acute upper GI bleed 05/18/2016   Aneurysm of thoracic aorta (HCC)    Ascending. 5.7 x 5.5cm - Bentall procedure   Aortic stenosis    Bioprosthetic AVR   Arthritis    Atrial fibrillation (HCC)    Documented on pacer interrogation 6/13   Complete heart block (HCC)    Status post pacemaker placement   Coronary atherosclerosis of native coronary artery    Multivessel - LIMA to LAD, SVG to OM, SVG to PDA   DJD (degenerative joint disease)    Esophageal dysmotility    Essential hypertension    Fibromyalgia    Gout    H/O arteriovenous malformation (AVM)    H/O hiatal  hernia    Hemorrhoid    History of esophageal ulcer    History of skin cancer    Hx of adenomatous colonic polyps    Iron deficiency anemia due to chronic blood loss    Melena    Pacemaker-Medtronic    PMR (polymyalgia rheumatica) (HCC)    SIRS (systemic inflammatory response syndrome) (HCC) 01/16/2015   Past Surgical History:  Procedure Laterality Date   AORTIC VALVE REPLACEMENT  01/24/12   APPENDECTOMY     BACK SURGERY     1965 or 1966 lumbar back surgery   BENTALL PROCEDURE  01/24/2012   Procedure: BENTALL PROCEDURE;  Surgeon: Kerin Perna, MD;  Location[facility] OR;  Service: Open Heart Surgery;  Laterality: N/A;   BILATERAL KNEE ARTHROSCOPY     CATARACT EXTRACTION W/PHACO Left 12/03/2016   Procedure: CATARACT EXTRACTION PHACO AND INTRAOCULAR LENS PLACEMENT LEFT EYE;  Surgeon: Gemma Payor, MD;  Location: [facility]S;  Service: Ophthalmology;  Laterality: Left;  CDE:  10.87   CATARACT EXTRACTION W/PHACO Right 12/13/2016   Procedure: CATARACT EXTRACTION PHACO AND INTRAOCULAR LENS PLACEMENT (IOC);  Surgeon: Gemma Payor, MD;  Location: [facility] ORS;  Service: Ophthalmology;  Laterality: Right;  right cde 11.78   CORONARY ARTERY BYPASS GRAFT  01/24/2012   Procedure: CORONARY ARTERY BYPASS GRAFTING (CABG);  Surgeon: Kerin Perna, MD;  Location: [facility] OR;  Service: Open Heart Surgery;  Laterality: N/A;   ESOPHAGOGASTRODUODENOSCOPY  06/17/2012   Procedure:  ESOPHAGOGASTRODUODENOSCOPY (EGD);  Surgeon: Malissa Hippo, MD;  Location[facility];  Service: Endoscopy;  Laterality: N/A;  ED   ESOPHAGOGASTRODUODENOSCOPY N/A 05/19/2016   Procedure: ESOPHAGOGASTRODUODENOSCOPY (EGD);  Surgeon: West Bali, MD;  Location: [facility];  Service: Endoscopy;  Laterality: N/A;   HEMORROIDECTOMY     HERNIA REPAIR     Left inguinal hernia repair     PACEMAKER PLACEMENT     Medtronic 4/13 - Dr. Johney Frame   PERMANENT PACEMAKER INSERTION N/A 02/01/2012   Procedure: PERMANENT PACEMAKER INSERTION;  Surgeon: Hillis Range, MD;  Medtronic Adapta L model ADDRL 1 (serial number [id number])       A IV Location/Drains/Wounds Patient Lines/Drains/Airways Status     Active Line/Drains/Airways     Name Placement date Placement time Site Days   Peripheral IV 12/19/23 20 G 1" Right Antecubital 12/19/23  0549  Antecubital  less than 1   Peripheral IV 12/19/23 22 G 1" Posterior;Right Forearm 12/19/23  0649  Forearm  less than 1            Intake/Output Last 24 hours No intake or output data in the 24 hours ending 12/19/23 8469  Labs/Imaging Results for orders placed or performed during the hospital encounter of 12/19/23 (from the past 48 hours)  Resp panel by RT-PCR (RSV, Flu A&B, Covid) Anterior Nasal Swab     Status: None   Collection Time: 12/19/23  5:31 AM   Specimen: Anterior Nasal Swab  Result Value Ref Range   SARS Coronavirus 2 by RT PCR NEGATIVE NEGATIVE    Comment: (NOTE) SARS-CoV-2 target nucleic acids are NOT DETECTED.  The SARS-CoV-2 RNA is generally detectable in upper respiratory specimens during the acute phase of infection. The lowest concentration of SARS-CoV-2 viral copies this assay can detect is 138 copies/mL. A negative result does not preclude SARS-Cov-2 infection and should not be used as the sole basis for treatment or other patient management decisions. A negative result may occur with  improper specimen collection/handling, submission of specimen other than nasopharyngeal swab, presence of viral mutation(s) within the areas targeted by this assay, and inadequate number of viral copies(<138 copies/mL). A negative result must be combined with clinical observations, patient history, and epidemiological information. The expected result is Negative.  Fact Sheet for Patients:  BloggerCourse.com  Fact Sheet for Healthcare Providers:  SeriousBroker.it  This test is no t yet approved or cleared by the [place] FDA and  has been  authorized for detection and/or diagnosis of SARS-CoV-2 by FDA under an Emergency Use Authorization (EUA). This EUA will remain  in effect (meaning this test can be used) for the duration of the COVID-19 declaration under Section 564(b)(1) of the Act, 21 U.S.C.section 360bbb-3(b)(1), unless the authorization is terminated  or revoked sooner.       Influenza A by PCR NEGATIVE NEGATIVE   Influenza B by PCR NEGATIVE NEGATIVE    Comment: (NOTE) The Xpert Xpress SARS-CoV-2/FLU/RSV plus assay is intended as an aid in the diagnosis of influenza from Nasopharyngeal swab specimens and should not be used as a sole basis for treatment. Nasal washings and aspirates are unacceptable for Xpert Xpress SARS-CoV-2/FLU/RSV testing.  Fact Sheet for Patients: BloggerCourse.com  Fact Sheet for Healthcare Providers: SeriousBroker.it  This test is not yet approved or cleared by the [place] FDA and has been authorized for detection and/or diagnosis of SARS-CoV-2 by FDA under an Emergency Use Authorization (EUA). This EUA will remain in effect (meaning this test can  be used) for the duration of the COVID-19 declaration under Section 564(b)(1) of the Act, 21 U.S.C. section 360bbb-3(b)(1), unless the authorization is terminated or revoked.     Resp Syncytial Virus by PCR NEGATIVE NEGATIVE    Comment: (NOTE) Fact Sheet for Patients: BloggerCourse.com  Fact Sheet for Healthcare Providers: SeriousBroker.it  This test is not yet approved or cleared by the [place] FDA and has been authorized for detection and/or diagnosis of SARS-CoV-2 by FDA under an Emergency Use Authorization (EUA). This EUA will remain in effect (meaning this test can be used) for the duration of the COVID-19 declaration under Section 564(b)(1) of the Act, 21 U.S.C. section 360bbb-3(b)(1), unless the authorization is  terminated or revoked.  Performed at [facility], [address] [place], [place] [address]   CBC with Differential     Status: Abnormal   Collection Time: 12/19/23  5:31 AM  Result Value Ref Range   WBC 14.9 (H) 4.0 - 10.5 K/uL   RBC 4.13 (L) 4.22 - 5.81 MIL/uL   Hemoglobin 12.3 (L) 13.0 - 17.0 g/dL   HCT 60.4 54.0 - 98.1 %   MCV 95.4 80.0 - 100.0 fL   MCH 29.8 26.0 - 34.0 pg   MCHC 31.2 30.0 - 36.0 g/dL   RDW 19.1 47.8 - 29.5 %   Platelets 174 150 - 400 K/uL   nRBC 0.0 0.0 - 0.2 %   Neutrophils Relative % 87 %   Neutro Abs 13.0 (H) 1.7 - 7.7 K/uL   Lymphocytes Relative 6 %   Lymphs Abs 0.9 0.7 - 4.0 K/uL   Monocytes Relative 5 %   Monocytes Absolute 0.8 0.1 - 1.0 K/uL   Eosinophils Relative 1 %   Eosinophils Absolute 0.1 0.0 - 0.5 K/uL   Basophils Relative 0 %   Basophils Absolute 0.1 0.0 - 0.1 K/uL   Immature Granulocytes 1 %   Abs Immature Granulocytes 0.07 0.00 - 0.07 K/uL    Comment: Performed at [facility], [address], [place], [place] [address]  Protime-INR     Status: Abnormal   Collection Time: 12/19/23  5:31 AM  Result Value Ref Range   Prothrombin Time 28.5 (H) 11.4 - 15.2 seconds   INR 2.7 (H) 0.8 - 1.2    Comment: (NOTE) INR goal varies based on device and disease states. Performed at [facility], [address], [place], [place] [address]   Basic metabolic panel     Status: Abnormal   Collection Time: 12/19/23  5:31 AM  Result Value Ref Range   Sodium 135 135 - 145 mmol/L   Potassium 3.8 3.5 - 5.1 mmol/L   Chloride 105 98 - 111 mmol/L   CO2 23 22 - 32 mmol/L   Glucose, Bld 131 (H) 70 - 99 mg/dL    Comment: Glucose reference range applies only to samples taken after fasting for at least 8 hours.   BUN 29 (H) 8 - 23 mg/dL   Creatinine, Ser 4.69 (H) 0.61 - 1.24 mg/dL   Calcium 8.6 (L) 8.9 - 10.3 mg/dL   GFR, Estimated 49 (L) >60 mL/min    Comment: (NOTE) Calculated using the CKD-EPI Creatinine Equation (2021)    Anion gap 7 5 - 15    Comment:  Performed at [facility], [address], [place], [place] [address]  Lactic acid, plasma     Status: Abnormal   Collection Time: 12/19/23  5:32 AM  Result Value Ref Range   Lactic Acid, Venous  2.6 (HH) 0.5 - 1.9 mmol/L    Comment: CRITICAL RESULT CALLED TO, READ BACK BY AND VERIFIED WITH Zakaiya Lares,J AT 6:30AM ON 12/19/23 BY [facility] Performed at [facility], [address], [place], [place] [address]    DG Chest Portable 1 View Result Date: 12/19/2023 CLINICAL DATA:  Persistent cough EXAM: PORTABLE CHEST 1 VIEW COMPARISON:  08/29/2023 FINDINGS: Unchanged left chest wall pacer. Previous median sternotomy and aortic valve repair. Aortic atherosclerotic calcifications. Stable cardiomediastinal contours. Retrocardiac opacity with indistinct left hemidiaphragm is identified. Blunting of the left costophrenic angle. Mild patchy opacities identified in the right lower lung. Visualized osseous structures are unremarkable. IMPRESSION: 1. Retrocardiac opacity with indistinct left hemidiaphragm. Findings may reflect atelectasis and/or pneumonia. 2. Mild patchy opacities in the right lower lung. 3. Blunting of left costophrenic angle which may reflect chronic pleuroparenchymal scarring versus small effusion. Electronically Signed   By: Signa Kell M.D.   On: 12/19/2023 06:12    Pending Labs Unresulted Labs (From admission, onward)     Start     Ordered   12/19/23 0521  Lactic acid, plasma  (Undifferentiated presentation (screening labs and basic nursing orders))  STAT Now then every 2 hours,   R (with STAT occurrences)      12/19/23 0520   12/19/23 0521  Blood Culture (routine x 2)  (Undifferentiated presentation (screening labs and basic nursing orders))  BLOOD CULTURE X 2,   STAT      12/19/23 0520            Vitals/Pain Today's Vitals   12/19/23 0515 12/19/23 0545 12/19/23 0600 12/19/23 0615  BP: (!) 142/76 125/62 (!) 118/58 126/63  Pulse: 76 70 73 72  Resp: 20 (!) 34 (!) 28 (!) 29  Temp:       TempSrc:      SpO2: 93% 96% 94% 100%  Weight:      Height:      PainSc:        Isolation Precautions No active isolations  Medications Medications  azithromycin (ZITHROMAX) 500 mg in sodium chloride 0.9 % 250 mL IVPB (500 mg Intravenous New Bag/Given 12/19/23 0715)  lactated ringers infusion (0 mLs Intravenous Hold 12/19/23 0649)  lactated ringers bolus 1,000 mL (1,000 mLs Intravenous New Bag/Given 12/19/23 0649)    And  lactated ringers bolus 1,000 mL (has no administration in time range)    And  lactated ringers bolus 1,000 mL (has no administration in time range)  ipratropium-albuterol (DUONEB) 0.5-2.5 (3) MG/3ML nebulizer solution 3 mL (3 mLs Nebulization Given 12/19/23 0611)  cefTRIAXone (ROCEPHIN) 1 g in sodium chloride 0.9 % 100 mL IVPB (0 g Intravenous Stopped 12/19/23 0715)    Mobility walks with device     Focused Assessments Pulmonary Assessment Handoff:  Lung sounds: Bilateral Breath Sounds: Expiratory wheezes, Diminished L Breath Sounds: Diminished, Inspiratory wheezes R Breath Sounds: Diminished, Inspiratory wheezes O2 Device: Room Air      R Recommendations: See Admitting Provider Note  Report given to:   Additional Notes:

## 2023-12-19 NOTE — ED Notes (Signed)
 Informed by Dr. Laural Benes to hold 1,000 LR Bolus due to Patient received 3L bolus of LR already and await results from chest x-ray.

## 2023-12-20 ENCOUNTER — Inpatient Hospital Stay (HOSPITAL_COMMUNITY): Payer: Medicare Other

## 2023-12-20 ENCOUNTER — Encounter (HOSPITAL_COMMUNITY): Payer: Self-pay | Admitting: Family Medicine

## 2023-12-20 DIAGNOSIS — I4891 Unspecified atrial fibrillation: Secondary | ICD-10-CM | POA: Diagnosis not present

## 2023-12-20 DIAGNOSIS — R7881 Bacteremia: Secondary | ICD-10-CM

## 2023-12-20 DIAGNOSIS — J189 Pneumonia, unspecified organism: Secondary | ICD-10-CM | POA: Diagnosis not present

## 2023-12-20 DIAGNOSIS — B9689 Other specified bacterial agents as the cause of diseases classified elsewhere: Secondary | ICD-10-CM

## 2023-12-20 DIAGNOSIS — Z952 Presence of prosthetic heart valve: Secondary | ICD-10-CM | POA: Diagnosis not present

## 2023-12-20 LAB — CBC WITH DIFFERENTIAL/PLATELET
Abs Immature Granulocytes: 0.08 10*3/uL — ABNORMAL HIGH (ref 0.00–0.07)
Basophils Absolute: 0.1 10*3/uL (ref 0.0–0.1)
Basophils Relative: 0 %
Eosinophils Absolute: 0.1 10*3/uL (ref 0.0–0.5)
Eosinophils Relative: 1 %
HCT: 40.5 % (ref 39.0–52.0)
Hemoglobin: 12.7 g/dL — ABNORMAL LOW (ref 13.0–17.0)
Immature Granulocytes: 1 %
Lymphocytes Relative: 10 %
Lymphs Abs: 1.7 10*3/uL (ref 0.7–4.0)
MCH: 30.2 pg (ref 26.0–34.0)
MCHC: 31.4 g/dL (ref 30.0–36.0)
MCV: 96.4 fL (ref 80.0–100.0)
Monocytes Absolute: 1.3 10*3/uL — ABNORMAL HIGH (ref 0.1–1.0)
Monocytes Relative: 8 %
Neutro Abs: 13.2 10*3/uL — ABNORMAL HIGH (ref 1.7–7.7)
Neutrophils Relative %: 80 %
Platelets: 114 10*3/uL — ABNORMAL LOW (ref 150–400)
RBC: 4.2 MIL/uL — ABNORMAL LOW (ref 4.22–5.81)
RDW: 15 % (ref 11.5–15.5)
WBC: 16.4 10*3/uL — ABNORMAL HIGH (ref 4.0–10.5)
nRBC: 0 % (ref 0.0–0.2)

## 2023-12-20 LAB — BLOOD CULTURE ID PANEL (REFLEXED) - BCID2

## 2023-12-20 LAB — BASIC METABOLIC PANEL
Anion gap: 8 (ref 5–15)
BUN: 27 mg/dL — ABNORMAL HIGH (ref 8–23)
CO2: 19 mmol/L — ABNORMAL LOW (ref 22–32)
Calcium: 8.6 mg/dL — ABNORMAL LOW (ref 8.9–10.3)
Chloride: 106 mmol/L (ref 98–111)
Creatinine, Ser: 1.18 mg/dL (ref 0.61–1.24)
GFR, Estimated: 57 mL/min — ABNORMAL LOW (ref >60)
Glucose, Bld: 103 mg/dL — ABNORMAL HIGH (ref 70–99)
Potassium: 4.1 mmol/L (ref 3.5–5.1)
Sodium: 133 mmol/L — ABNORMAL LOW (ref 135–145)

## 2023-12-20 LAB — PROTIME-INR
INR: 3.5 — ABNORMAL HIGH (ref 0.8–1.2)
Prothrombin Time: 35.7 s — ABNORMAL HIGH (ref 11.4–15.2)

## 2023-12-20 LAB — ECHOCARDIOGRAM COMPLETE
AR max vel: 2.71 cm2
AV Area VTI: 2.79 cm2
AV Area mean vel: 2.62 cm2
AV Mean grad: 1 mm[Hg]
AV Peak grad: 2 mm[Hg]
Ao pk vel: 0.71 m/s
Area-P 1/2: 4.1 cm2
Calc EF: 45.7 %
Est EF: 40
Height: 66 in
S' Lateral: 3.7 cm
Single Plane A2C EF: 49.5 %
Single Plane A4C EF: 40.7 %
Weight: 3212.8 [oz_av]

## 2023-12-20 LAB — MAGNESIUM: Magnesium: 1.7 mg/dL (ref 1.7–2.4)

## 2023-12-20 LAB — PROCALCITONIN: Procalcitonin: 7.46 ng/mL

## 2023-12-20 MED ORDER — PERFLUTREN LIPID MICROSPHERE
1.0000 mL | INTRAVENOUS | Status: AC | PRN
  Administered 2023-12-20: 3 mL via INTRAVENOUS

## 2023-12-20 MED ORDER — SODIUM CHLORIDE 0.9 % IV SOLN: 2.0000 g | Freq: Two times a day (BID) | INTRAVENOUS | Status: DC

## 2023-12-20 MED ORDER — SODIUM CHLORIDE 0.9 % IV SOLN
2.0000 g | Freq: Four times a day (QID) | INTRAVENOUS | Status: DC
  Administered 2023-12-20 – 2023-12-24 (×15): 2 g via INTRAVENOUS
  Filled 2023-12-20 (×2): qty 2000
  Filled 2023-12-20: qty 2
  Filled 2023-12-20 (×5): qty 2000
  Filled 2023-12-20: qty 2
  Filled 2023-12-20 (×5): qty 2000
  Filled 2023-12-20: qty 2
  Filled 2023-12-20 (×6): qty 2000

## 2023-12-20 MED ORDER — SODIUM CHLORIDE 0.9 % IV SOLN
2.0000 g | Freq: Two times a day (BID) | INTRAVENOUS | Status: DC
  Administered 2023-12-20 – 2023-12-23 (×7): 2 g via INTRAVENOUS
  Filled 2023-12-20 (×7): qty 20

## 2023-12-20 MED ORDER — LACTATED RINGERS IV SOLN: INTRAVENOUS | Status: DC

## 2023-12-20 NOTE — Consult Note (Signed)
 Virtual Visit Note   Location: Patient: [facility] Provider: [facility]   I discussed the limitations of evaluation and management by telemedicine and the availability of in person evaluation. The patient expressed understanding and agreed to proceed.  [facility]  Patient Active Problem List   Diagnosis Date Noted   CAP (community acquired pneumonia) 12/19/2023   AKI (acute kidney injury) (HCC) 12/19/2023   Leukocytosis 12/19/2023   Lactic acidosis 12/19/2023   Generalized weakness 12/19/2023   Hyperglycemia 12/19/2023   Oropharyngeal dysphagia 12/24/2022   Regurgitation of food 12/24/2022   Hemorrhoid 12/11/2017   Constipation 12/11/2017   Iron deficiency anemia due to chronic blood loss 10/03/2016   Acute upper GI bleed 05/18/2016   Melena    Symptomatic anemia    Hypotension    History of esophageal ulcer    H/O arteriovenous malformation (AVM)    Chronic anticoagulation - Coumadin, CHADS2VASC=5    UTI (urinary tract infection) 01/16/2015   SIRS (systemic inflammatory response syndrome) (HCC) 01/16/2015   Precordial pain 06/15/2014   Aortic valve replaced 08/25/2012   Pacemaker-Medtronic 07/29/2012   Atrial fibrillation (HCC) 02/11/2012   CHB (complete heart block) (HCC) 01/31/2012   Aneurysm of thoracic aorta (HCC)    Coronary atherosclerosis of native coronary artery 05/10/2011   Essential hypertension, benign    Aortic stenosis     Current Discharge Medication List     CONTINUE these medications which have NOT CHANGED   Details  acetaminophen (TYLENOL) 500 MG tablet Take 1,000 mg by mouth every 6 (six) hours as needed for mild pain.    allopurinol (ZYLOPRIM) 300 MG tablet Take 300 mg by mouth daily. Take 1  daily    furosemide (LASIX) 20 MG tablet Take 20 mg by mouth every other day.    metoprolol succinate (TOPROL-XL) 25 MG 24 hr tablet TAKE (2) TABLETS BY MOUTH DAILY. Qty: 180 tablet, Refills: 1   Comments: This prescription was filled  on 07/29/2023. Any refills authorized will be placed on file.    pantoprazole (PROTONIX) 40 MG tablet TAKE ONE TABLET BY MOUTH ONCE DAILY IN THE MORNING. Qty: 30 tablet, Refills: 6    pravastatin (PRAVACHOL) 20 MG tablet Take 20 mg by mouth every evening.  Refills: 11    sennosides-docusate sodium (SENOKOT-S) 8.6-50 MG tablet Take 1 tablet by mouth 2 (two) times daily.    warfarin (COUMADIN) 5 MG tablet TAKE 1 TABLET BY MOUTH ONCE DAILY OR AS DIRECTED. Qty: 90 tablet, Refills: 1        History of Present Illness: 89 year old male with prior history of thoracic aneurysm/aortic stenosis status post Bentall procedure with AVR, A-fib on AC, CAD s/p CABG, complete heart block status post PPM, history of AVM, history of esophageal ulcer/GERD, HTN, gout who presented to the ED on 2/27 with shortness of breath, cough and chills as well as generalized malaise and weakness including flulike symptoms with sore throat and headache.  At baseline patient is very active and was working on his tractor day before ED presentation  At ED afebrile tachypneic concerns for pneumonia, sepsis Labs remarkable for leukocytosis WBC 14.9 and AKI with creatinine 1.35, lactic acid 2.6, hemoglobin 12.3 Chest x-ray concerning for atelectasis and or pneumonia.  Mild patchy opacities in the right lower lung.  Chronic pleural-parenchymal scarring versus small effusion.  Given DuoNebs, as well as ceftriaxone and azithromycin  ROS unavailable   Past Medical History:  Diagnosis Date   Acute upper GI bleed 05/18/2016   Aneurysm  of thoracic aorta (HCC)    Ascending. 5.7 x 5.5cm - Bentall procedure   Aortic stenosis    Bioprosthetic AVR   Arthritis    Atrial fibrillation (HCC)    Documented on pacer interrogation 6/13   Complete heart block (HCC)    Status post pacemaker placement   Coronary atherosclerosis of native coronary artery    Multivessel - LIMA to LAD, SVG to OM, SVG to PDA   DJD (degenerative joint disease)     Esophageal dysmotility    Essential hypertension    Fibromyalgia    Gout    H/O arteriovenous malformation (AVM)    H/O hiatal hernia    Hemorrhoid    History of esophageal ulcer    History of skin cancer    Hx of adenomatous colonic polyps    Iron deficiency anemia due to chronic blood loss    Melena    Pacemaker-Medtronic    PMR (polymyalgia rheumatica) (HCC)    SIRS (systemic inflammatory response syndrome) (HCC) 01/16/2015   Past Surgical History:  Procedure Laterality Date   AORTIC VALVE REPLACEMENT  01/24/12   APPENDECTOMY     BACK SURGERY     1965 or 1966 lumbar back surgery   BENTALL PROCEDURE  01/24/2012   Procedure: BENTALL PROCEDURE;  Surgeon: Kerin Perna, MD;  Location: [facility] OR;  Service: Open Heart Surgery;  Laterality: N/A;   BILATERAL KNEE ARTHROSCOPY     CATARACT EXTRACTION W/PHACO Left 12/03/2016   Procedure: CATARACT EXTRACTION PHACO AND INTRAOCULAR LENS PLACEMENT LEFT EYE;  Surgeon: Gemma Payor, MD;  Location: [facility];  Service: Ophthalmology;  Laterality: Left;  CDE:  10.87   CATARACT EXTRACTION W/PHACO Right 12/13/2016   Procedure: CATARACT EXTRACTION PHACO AND INTRAOCULAR LENS PLACEMENT (IOC);  Surgeon: Gemma Payor, MD;  Location: [facility];  Service: Ophthalmology;  Laterality: Right;  right cde 11.78   CORONARY ARTERY BYPASS GRAFT  01/24/2012   Procedure: CORONARY ARTERY BYPASS GRAFTING (CABG);  Surgeon: Kerin Perna, MD;  Location: [facility] OR;  Service: Open Heart Surgery;  Laterality: N/A;   ESOPHAGOGASTRODUODENOSCOPY  06/17/2012   Procedure: ESOPHAGOGASTRODUODENOSCOPY (EGD);  Surgeon: Malissa Hippo, MD;  [facility];  Service: Endoscopy;  Laterality: N/A;  ED   ESOPHAGOGASTRODUODENOSCOPY N/A 05/19/2016   Procedure: ESOPHAGOGASTRODUODENOSCOPY (EGD);  Surgeon: West Bali, MD;  Location: [facility];  Service: Endoscopy;  Laterality: N/A;   HEMORROIDECTOMY     HERNIA REPAIR     Left inguinal hernia repair     PACEMAKER PLACEMENT     Medtronic 4/13  - Dr. Johney Frame   PERMANENT PACEMAKER INSERTION N/A 02/01/2012   Procedure: PERMANENT PACEMAKER INSERTION;  Surgeon: Hillis Range, MD; Medtronic Adapta L model ADDRL 1 (serial number [id number])       Social History   Tobacco Use   Smoking status: Never   Smokeless tobacco: Never  Vaping Use   Vaping status: Never Used  Substance Use Topics   Alcohol use: No    Alcohol/week: 0.0 standard drinks of alcohol   Drug use: No    Family History  Problem Relation Age of Onset   Kidney disease Mother    Heart disease Father    Anesthesia problems Neg Hx    Hypotension Neg Hx    Malignant hyperthermia Neg Hx    Pseudochol deficiency Neg Hx     Allergies  Allergen Reactions   Imdur [Isosorbide Nitrate] Other (See Comments)    Nausea and lightheaded  Health Maintenance  Topic Date Due   Medicare Annual Wellness (AWV)  Never done   Pneumonia Vaccine 85+ Years old (1 of 2 - PCV) Never done   DTaP/Tdap/Td (1 - Tdap) Never done   Zoster Vaccines- Shingrix (1 of 2) Never done   COVID-19 Vaccine (2 - Moderna risk series) 09/22/2020   INFLUENZA VACCINE  Completed   HPV VACCINES  Aged Out    Observations/Objective: Patient not seen in person and chart reviewed remotely, no CART available for video consult. Assessment and plan are based mostly on chart review.   Assessment and Plan: # E faecalis bacteremia r/o PV endocarditis as well as CIED infection  - 2 sets of repeat blood cx ordered for 3/1 - TTE ordered but needs TEE  - Needs EP evaluation due to PPM and has been notified  - Will change antibiotics to IV ampicillin and ceftriaxone for possibility of endocarditis. - ID team at [facility] to follow  # Acute hypoxic respiratory failure  - likely 2/2 above  - 2/28 strep pneumo ag negative  - Fu legionella ag - abtx as above, can complete 3 days of azithromycin    D/w primary team and ID pharm D  I provided 30 minutes of non-face-to-face time during this encounter.  Of note,  portions of this note may have been created with voice recognition software. While this note has been edited for accuracy, occasional wrong-word or 'sound-a-like' substitutions may have occurred due to the inherent limitations of voice recognition software.   Victoriano Lain, MD [facility] [phone] pager   [phone] cell 12/20/2023, 9:58 AM

## 2023-12-20 NOTE — Progress Notes (Signed)
 Called report to [facility], going to 3E, room 16. All questions and concerns answered. Patient has no personal belongings to go with him as family member, daughter Clydie Braun, took home today. Awaiting transport.

## 2023-12-20 NOTE — Plan of Care (Signed)

## 2023-12-20 NOTE — Progress Notes (Addendum)
 PROGRESS NOTE   Douglas [name]  UUV:[id number] DOB: May 02, 1928 DOA: 12/19/2023 PCP: Carylon Perches, MD   Chief Complaint  Patient presents with   Cough   Chills   Shortness of Breath   Level of care: Med-Surg  Brief Admission History:  89 year old male with history of Afib on warfarin, s/p AVR, CAD, HTN, h/o AVM, h/o esophageal ulcer, h/o CHB with pacemaker in place, GERD who presented to ED by home complaining of cough, chills and SOB that started yesterday.  He denies having chest pain.  He reported overall malaise and weakness.  He has no known sick contacts.  He denies having chest pain symptoms.  He reports that he is fairly active at his age and was working outside on a tractor yesterday.  He has flu like symptoms.  He has a headache and a sore throat.  On arrival he was treated for sepsis due to pneumonia with leukocytosis and abnormal chest xray.  He had abnormal lung sounds and admission was requested for further management.     Assessment and Plan:  Sepsis due to enterobacter faecalis  - continue IV fluid resuscitation - trended elevated lactic acid levels - treating pneumonia with broad spectrum antibiotics - continue other supportive measures as ordered  Enterobacter faecalis bacteremia  - ID telemedicine consult requested - Dr. Thedore Mins recommending transfer to [facility] - Dr. Thedore Mins requested assistance from EP cardiology given AVR - EP cardiology requesting transfer to [facility] and will consult at [facility] (discussed with BJoelene Millin) - ID recommending TEE if TTE unrevealing - TTE results still pending - ID adding ampicillin IV and endocarditis CTX dosing on 2/28 - transfer orders to [facility] placed   CAP - continue ceftriaxone and azithromycin as ordered - continue supportive measures - monitor CXR and clinical progress   History of Complete heart block s/p pacemaker - pacemaker in place - he is followed closely by cardiology outpatient - EP cardiology notified of bacteremia and requested to  transfer to [facility] for consultation    Leukocytosis  - secondary to pneumonia - follow CBC with differential - WBC trending down with treatments    Lactic acidosis - continue IV fluid resuscitation - trending down   Atrial fibrillation  - he is fully anticoagulated with warfarin  - resume home metoprolol succinate 25 mg daily   Acquired thrombophilia - requested pharm D to manage warfarin dosing inpatient and on antibiotics - checking daily PT/INR    Hyperlipidemia - resumed home pravastatin   Gout/hyperuricemia - resumed home allopurinol   GERD - resumed pantoprazole 40 mg daily   Generalized Weakness - PT eval in next 1-2 days when more clinically improved   DVT prophylaxis: warfarin therapeutic Code Status:  Full  Family Communication:  Disposition: ID (singh) and EP Veleta Miners) requesting transfer to [facility] for consultation, signed out to [facility] [address] on 2/28  Consultants:  Infectious Disease EP Cardiology   Procedures:  TTE 12/20/23   Antimicrobials:  Azithromycin 2/27>> Ceftriaxone 2/27>> Ampicillin 2/28>>   Subjective: Pt says he has had no further chills since being on the antibiotics.  He says overall he is starting to get back to himself.  After discussion about what ID and EP had recommended, he was agreeable to transfer for consult.   Objective: Vitals:   12/19/23 1700 12/19/23 2143 12/20/23 0026 12/20/23 0500  BP: (!) 151/77 (!) 150/75 (!) 148/88 (!) 150/74  Pulse: 70 70 74 70  Resp:  (!) 24 16 (!) 24  Temp: 98.1 F (36.7 C) 98.5 F (36.9 C) 98.5 F (36.9 C) 98 F (36.7 C)  TempSrc: Oral Oral Oral Oral  SpO2: 98% 100% 100% 99%  Weight:    91.1 kg  Height:        Intake/Output Summary (Last 24 hours) at 12/20/2023 1247 Last data filed at 12/20/2023 0700 Gross per 24 hour  Intake 480 ml  Output 100 ml  Net 380 ml   Filed Weights   12/19/23 0514 12/20/23 0500  Weight: 88.5 kg 91.1 kg   Examination:  General exam: Appears calm and  comfortable  Respiratory system: Clear to auscultation. Respiratory effort normal. Cardiovascular system: normal S1 & S2 heard. No JVD, murmurs, rubs, gallops or clicks. No pedal edema. Gastrointestinal system: Abdomen is nondistended, soft and nontender. No organomegaly or masses felt. Normal bowel sounds heard. Central nervous system: Alert and oriented. No focal neurological deficits. Extremities: Symmetric 5 x 5 power. Skin: No rashes, lesions or ulcers. Psychiatry: Judgement and insight appear normal. Mood & affect appropriate.   Data Reviewed: I have personally reviewed following labs and imaging studies  CBC: Recent Labs  Lab 12/19/23 0531 12/20/23 0502  WBC 14.9* 16.4*  NEUTROABS 13.0* 13.2*  HGB 12.3* 12.7*  HCT 39.4 40.5  MCV 95.4 96.4  PLT 174 114*    Basic Metabolic Panel: Recent Labs  Lab 12/19/23 0531 12/20/23 0502  NA 135 133*  K 3.8 4.1  CL 105 106  CO2 23 19*  GLUCOSE 131* 103*  BUN 29* 27*  CREATININE 1.35* 1.18  CALCIUM 8.6* 8.6*  MG  --  1.7    CBG: No results for input(s): "GLUCAP" in the last 168 hours.  Recent Results (from the past 240 hours)  Resp panel by RT-PCR (RSV, Flu A&B, Covid) Anterior Nasal Swab     Status: None   Collection Time: 12/19/23  5:31 AM   Specimen: Anterior Nasal Swab  Result Value Ref Range Status   SARS Coronavirus 2 by RT PCR NEGATIVE NEGATIVE Final    Comment: (NOTE) SARS-CoV-2 target nucleic acids are NOT DETECTED.  The SARS-CoV-2 RNA is generally detectable in upper respiratory specimens during the acute phase of infection. The lowest concentration of SARS-CoV-2 viral copies this assay can detect is 138 copies/mL. A negative result does not preclude SARS-Cov-2 infection and should not be used as the sole basis for treatment or other patient management decisions. A negative result may occur with  improper specimen collection/handling, submission of specimen other than nasopharyngeal swab, presence of viral  mutation(s) within the areas targeted by this assay, and inadequate number of viral copies(<138 copies/mL). A negative result must be combined with clinical observations, patient history, and epidemiological information. The expected result is Negative.  Fact Sheet for Patients:  BloggerCourse.com  Fact Sheet for Healthcare Providers:  SeriousBroker.it  This test is no t yet approved or cleared by the [place] FDA and  has been authorized for detection and/or diagnosis of SARS-CoV-2 by FDA under an Emergency Use Authorization (EUA). This EUA will remain  in effect (meaning this test can be used) for the duration of the COVID-19 declaration under Section 564(b)(1) of the Act, 21 U.S.C.section 360bbb-3(b)(1), unless the authorization is terminated  or revoked sooner.       Influenza A by PCR NEGATIVE NEGATIVE Final   Influenza B by PCR NEGATIVE NEGATIVE Final    Comment: (NOTE) The Xpert Xpress SARS-CoV-2/FLU/RSV plus assay is intended as an aid in the diagnosis of influenza from  Nasopharyngeal swab specimens and should not be used as a sole basis for treatment. Nasal washings and aspirates are unacceptable for Xpert Xpress SARS-CoV-2/FLU/RSV testing.  Fact Sheet for Patients: BloggerCourse.com  Fact Sheet for Healthcare Providers: SeriousBroker.it  This test is not yet approved or cleared by the [place] FDA and has been authorized for detection and/or diagnosis of SARS-CoV-2 by FDA under an Emergency Use Authorization (EUA). This EUA will remain in effect (meaning this test can be used) for the duration of the COVID-19 declaration under Section 564(b)(1) of the Act, 21 U.S.C. section 360bbb-3(b)(1), unless the authorization is terminated or revoked.     Resp Syncytial Virus by PCR NEGATIVE NEGATIVE Final    Comment: (NOTE) Fact Sheet for  Patients: BloggerCourse.com  Fact Sheet for Healthcare Providers: SeriousBroker.it  This test is not yet approved or cleared by the [place] FDA and has been authorized for detection and/or diagnosis of SARS-CoV-2 by FDA under an Emergency Use Authorization (EUA). This EUA will remain in effect (meaning this test can be used) for the duration of the COVID-19 declaration under Section 564(b)(1) of the Act, 21 U.S.C. section 360bbb-3(b)(1), unless the authorization is terminated or revoked.  Performed at [facility], [address], [place], [place] [address]   Blood Culture (routine x 2)     Status: None (Preliminary result)   Collection Time: 12/19/23  5:31 AM   Specimen: BLOOD  Result Value Ref Range Status   Specimen Description BLOOD RIGHT ANTECUBITAL  Final   Special Requests   Final    BOTTLES DRAWN AEROBIC AND ANAEROBIC Blood Culture adequate volume   Culture   Final    NO GROWTH 1 DAY Performed at [facility], [address] [place], [place] [address]    Report Status PENDING  Incomplete  Blood Culture (routine x 2)     Status: None (Preliminary result)   Collection Time: 12/19/23  6:06 AM   Specimen: BLOOD  Result Value Ref Range Status   Specimen Description   Final    BLOOD BLOOD RIGHT HAND Performed at [facility], [address], [place], [place] [address]    Special Requests   Final    BOTTLES DRAWN AEROBIC AND ANAEROBIC Blood Culture adequate volume Performed at [facility], [address], [place], [place] [address]    Culture  Setup Time   Final    GRAM POSITIVE COCCI IN BOTH AEROBIC AND ANAEROBIC BOTTLES Gram Stain Report Called to,Read Back By and Verified With: PRISTINA BINES [id number], VIRAY,J Organism ID to follow CRITICAL RESULT CALLED TO, READ BACK BY AND VERIFIED WITH: P BINES,RN@0028  12/20/23 MK    Culture   Final    GRAM POSITIVE COCCI TOO YOUNG TO READ Performed at [facility], [address], [place], [place] [address]    Report Status PENDING  Incomplete  Blood Culture ID Panel (Reflexed)     Status: Abnormal   Collection Time: 12/19/23  6:06 AM  Result Value Ref Range Status   Enterococcus faecalis DETECTED (A) NOT DETECTED Final    Comment: CRITICAL RESULT CALLED TO, READ BACK BY AND VERIFIED WITH: P BINES,RN@0027  12/20/23 MK    Enterococcus Faecium NOT DETECTED NOT DETECTED Final   Listeria monocytogenes NOT DETECTED NOT DETECTED Final   Staphylococcus species NOT DETECTED NOT DETECTED Final   Staphylococcus aureus (BCID) NOT DETECTED NOT DETECTED Final   Staphylococcus epidermidis NOT DETECTED NOT DETECTED Final   Staphylococcus lugdunensis NOT DETECTED NOT DETECTED Final  Streptococcus species NOT DETECTED NOT DETECTED Final   Streptococcus agalactiae NOT DETECTED NOT DETECTED Final   Streptococcus pneumoniae NOT DETECTED NOT DETECTED Final   Streptococcus pyogenes NOT DETECTED NOT DETECTED Final   A.calcoaceticus-baumannii NOT DETECTED NOT DETECTED Final   Bacteroides fragilis NOT DETECTED NOT DETECTED Final   Enterobacterales NOT DETECTED NOT DETECTED Final   Enterobacter cloacae complex NOT DETECTED NOT DETECTED Final   Escherichia coli NOT DETECTED NOT DETECTED Final   Klebsiella aerogenes NOT DETECTED NOT DETECTED Final   Klebsiella oxytoca NOT DETECTED NOT DETECTED Final   Klebsiella pneumoniae NOT DETECTED NOT DETECTED Final   Proteus species NOT DETECTED NOT DETECTED Final   Salmonella species NOT DETECTED NOT DETECTED Final   Serratia marcescens NOT DETECTED NOT DETECTED Final   Haemophilus influenzae NOT DETECTED NOT DETECTED Final   Neisseria meningitidis NOT DETECTED NOT DETECTED Final   Pseudomonas aeruginosa NOT DETECTED NOT DETECTED Final   Stenotrophomonas maltophilia NOT DETECTED NOT DETECTED Final   Candida albicans NOT DETECTED NOT DETECTED Final   Candida auris NOT DETECTED NOT DETECTED Final   Candida glabrata NOT DETECTED  NOT DETECTED Final   Candida krusei NOT DETECTED NOT DETECTED Final   Candida parapsilosis NOT DETECTED NOT DETECTED Final   Candida tropicalis NOT DETECTED NOT DETECTED Final   Cryptococcus neoformans/gattii NOT DETECTED NOT DETECTED Final   Vancomycin resistance NOT DETECTED NOT DETECTED Final    Comment: Performed at [facility], [address], [place], [place] [address]     Radiology Studies: DG CHEST PORT 1 VIEW Result Date: 12/19/2023 CLINICAL DATA:  Cough. EXAM: PORTABLE CHEST 1 VIEW COMPARISON:  Chest radiograph dated 12/19/2023 at 5:34 a.m. FINDINGS: Patient is rotated to the left. Stable mild cardiomegaly. Prior median sternotomy and aortic valve repair. Stable left chest wall pacemaker in place. Similar retrocardiac opacity and mild patchy right basilar opacities. No pneumothorax identified. No acute osseous abnormality. IMPRESSION: 1. Similar retrocardiac and mild patchy right basilar opacities could reflect atelectasis or infiltrate. 2. Similar blunting of the left costophrenic angle could reflect scarring or a small effusion. Electronically Signed   By: Hart Robinsons M.D.   On: 12/19/2023 11:49   DG Chest Portable 1 View Result Date: 12/19/2023 CLINICAL DATA:  Persistent cough EXAM: PORTABLE CHEST 1 VIEW COMPARISON:  08/29/2023 FINDINGS: Unchanged left chest wall pacer. Previous median sternotomy and aortic valve repair. Aortic atherosclerotic calcifications. Stable cardiomediastinal contours. Retrocardiac opacity with indistinct left hemidiaphragm is identified. Blunting of the left costophrenic angle. Mild patchy opacities identified in the right lower lung. Visualized osseous structures are unremarkable. IMPRESSION: 1. Retrocardiac opacity with indistinct left hemidiaphragm. Findings may reflect atelectasis and/or pneumonia. 2. Mild patchy opacities in the right lower lung. 3. Blunting of left costophrenic angle which may reflect chronic pleuroparenchymal scarring versus  small effusion. Electronically Signed   By: Signa Kell M.D.   On: 12/19/2023 06:12    Scheduled Meds:  allopurinol  300 mg Oral Daily   azithromycin  500 mg Oral Daily   guaiFENesin  600 mg Oral BID   metoprolol succinate  50 mg Oral Daily   pantoprazole  40 mg Oral QPM   pravastatin  20 mg Oral QPM   senna-docusate  1 tablet Oral BID   Warfarin - Pharmacist Dosing Inpatient   Does not apply q1600   Continuous Infusions:  ampicillin (OMNIPEN) IV     cefTRIAXone (ROCEPHIN)  IV 2 g (12/20/23 1035)   lactated ringers Stopped (12/19/23 0923)  lactated ringers       LOS: 1 day   Time spent: 57 mins  Ala Kratz Laural Benes, MD How to contact the [facility] Attending or Consulting provider 7A - 7P or covering provider during after hours 7P -7A, for this patient?  Check the care team in [facility] and look for a) attending/consulting [facility] provider listed and b) the [facility] team listed Log into [email or web address] to find provider on call.  Locate the [facility] provider you are looking for under [facility] and page to a number that you can be directly reached. If you still have difficulty reaching the provider, please page the [facility] (Director on Call) for the Hospitalists listed on amion for assistance.  12/20/2023, 12:47 PM

## 2023-12-20 NOTE — Progress Notes (Signed)
  Echocardiogram 2D Echocardiogram has been performed.  Douglas [name] 12/20/2023, 12:25 PM

## 2023-12-20 NOTE — Progress Notes (Signed)
 PHARMACY - ANTICOAGULATION CONSULT NOTE  Pharmacy Consult for warfarin Indication: atrial fibrillation  Allergies  Allergen Reactions   Imdur [Isosorbide Nitrate] Other (See Comments)    Nausea and lightheaded     Patient Measurements: Height: 5\' 6"  (167.6 cm) Weight: 91.1 kg (200 lb 12.8 oz) IBW/kg (Calculated) : 63.8 Heparin Dosing Weight:   Vital Signs: Temp: 98 F (36.7 C) (02/28 0500) Temp Source: Oral (02/28 0500) BP: 150/74 (02/28 0500) Pulse Rate: 70 (02/28 0500)  Labs: Recent Labs    12/19/23 0531 12/20/23 0502  HGB 12.3* 12.7*  HCT 39.4 40.5  PLT 174 114*  LABPROT 28.5* 35.7*  INR 2.7* 3.5*  CREATININE 1.35* 1.18    Estimated Creatinine Clearance: 40.4 mL/min (by C-G formula based on SCr of 1.18 mg/dL).   Medical History: Past Medical History:  Diagnosis Date   Acute upper GI bleed 05/18/2016   Aneurysm of thoracic aorta (HCC)    Ascending. 5.7 x 5.5cm - Bentall procedure   Aortic stenosis    Bioprosthetic AVR   Arthritis    Atrial fibrillation (HCC)    Documented on pacer interrogation 6/13   Complete heart block (HCC)    Status post pacemaker placement   Coronary atherosclerosis of native coronary artery    Multivessel - LIMA to LAD, SVG to OM, SVG to PDA   DJD (degenerative joint disease)    Esophageal dysmotility    Essential hypertension    Fibromyalgia    Gout    H/O arteriovenous malformation (AVM)    H/O hiatal hernia    Hemorrhoid    History of esophageal ulcer    History of skin cancer    Hx of adenomatous colonic polyps    Iron deficiency anemia due to chronic blood loss    Melena    Pacemaker-Medtronic    PMR (polymyalgia rheumatica) (HCC)    SIRS (systemic inflammatory response syndrome) (HCC) 01/16/2015    Medications:  Medications Prior to Admission  Medication Sig Dispense Refill Last Dose/Taking   acetaminophen (TYLENOL) 500 MG tablet Take 1,000 mg by mouth every 6 (six) hours as needed for mild pain.   12/18/2023  Morning   allopurinol (ZYLOPRIM) 300 MG tablet Take 300 mg by mouth daily. Take 1  daily   12/18/2023 Morning   furosemide (LASIX) 20 MG tablet Take 20 mg by mouth every other day.   12/18/2023 Morning   metoprolol succinate (TOPROL-XL) 25 MG 24 hr tablet TAKE (2) TABLETS BY MOUTH DAILY. 180 tablet 1 12/18/2023 Morning   pantoprazole (PROTONIX) 40 MG tablet TAKE ONE TABLET BY MOUTH ONCE DAILY IN THE MORNING. 30 tablet 6 12/18/2023 Morning   pravastatin (PRAVACHOL) 20 MG tablet Take 20 mg by mouth every evening.   11 12/18/2023 Evening   sennosides-docusate sodium (SENOKOT-S) 8.6-50 MG tablet Take 1 tablet by mouth 2 (two) times daily.   12/18/2023 Evening   warfarin (COUMADIN) 5 MG tablet TAKE 1 TABLET BY MOUTH ONCE DAILY OR AS DIRECTED. (Patient taking differently: Take 5 mg by mouth daily. Or as directed) 90 tablet 1 12/18/2023 at  7:00 PM    Assessment: Pharmacy consulted to dose warfarin in patient with atrial fibrillation. PMH includes aortic stenosis s/p AVR, CAD, pacemaker, HTN, hx AVM, hx esophageal ulcers, acute UGIB, hemorrhoids.  Patient's home dose listed as 2.5 mg on Sun and 5 mg ROW (anti-coag visit note) with last dose 2/26. INR on admission is therapeutic at 2.7.  CBC WNL  Goal of Therapy:  INR 2-3 Monitor  platelets by anticoagulation protocol: Yes  Monitoring: Date INR Warfarin Dose 2/27 2.7 5 mg 2/28 3.5 Held   Plan:  Hold warfarin today Daily INR Check CBC at least every 7 days for patients on warfarin  Will M. Dareen Piano, PharmD Clinical Pharmacist 12/20/2023 9:52 AM

## 2023-12-20 NOTE — Care Management Important Message (Signed)
 Important Message  Patient Details  Name: Douglas [name] MRN: [id number] Date of Birth: 15-Aug-1928   Important Message Given:  Yes - Medicare IM     Corey Harold 12/20/2023, 11:30 AM

## 2023-12-20 NOTE — Plan of Care (Signed)
°  Problem: Education: °Goal: Knowledge of General Education information will improve °Description: Including pain rating scale, medication(s)/side effects and non-pharmacologic comfort measures °Outcome: Progressing °  °Problem: Clinical Measurements: °Goal: Diagnostic test results will improve °Outcome: Progressing °  °Problem: Clinical Measurements: °Goal: Respiratory complications will improve °Outcome: Progressing °  °Problem: Activity: °Goal: Risk for activity intolerance will decrease °Outcome: Progressing °  °

## 2023-12-20 NOTE — Progress Notes (Signed)
 PHARMACY - PHYSICIAN COMMUNICATION CRITICAL VALUE ALERT - BLOOD CULTURE IDENTIFICATION (BCID)  Douglas [name] is an 89 y.o. male who presented to [facility] on 12/19/2023 with a chief complaint of chills.   Assessment:  89 year old male admitted with chills, shortness of breath and cough. Now with E faecalis in 1/2 blood cultures. Noted history of pacemaker and bioprosthetic AVR.   Name of physician (or Provider) Contacted: Robet Leu (ID)  Current antibiotics: ceftriaxone/azith  Changes to prescribed antibiotics recommended:  Add ampicillin Change ceftriaxone to 2 gm q 12 to cover potential endocarditis   Results for orders placed or performed during the hospital encounter of 12/19/23  Blood Culture ID Panel (Reflexed) (Collected: 12/19/2023  6:06 AM)  Result Value Ref Range   Enterococcus faecalis DETECTED (A) NOT DETECTED   Enterococcus Faecium NOT DETECTED NOT DETECTED   Listeria monocytogenes NOT DETECTED NOT DETECTED   Staphylococcus species NOT DETECTED NOT DETECTED   Staphylococcus aureus (BCID) NOT DETECTED NOT DETECTED   Staphylococcus epidermidis NOT DETECTED NOT DETECTED   Staphylococcus lugdunensis NOT DETECTED NOT DETECTED   Streptococcus species NOT DETECTED NOT DETECTED   Streptococcus agalactiae NOT DETECTED NOT DETECTED   Streptococcus pneumoniae NOT DETECTED NOT DETECTED   Streptococcus pyogenes NOT DETECTED NOT DETECTED   A.calcoaceticus-baumannii NOT DETECTED NOT DETECTED   Bacteroides fragilis NOT DETECTED NOT DETECTED   Enterobacterales NOT DETECTED NOT DETECTED   Enterobacter cloacae complex NOT DETECTED NOT DETECTED   Escherichia coli NOT DETECTED NOT DETECTED   Klebsiella aerogenes NOT DETECTED NOT DETECTED   Klebsiella oxytoca NOT DETECTED NOT DETECTED   Klebsiella pneumoniae NOT DETECTED NOT DETECTED   Proteus species NOT DETECTED NOT DETECTED   Salmonella species NOT DETECTED NOT DETECTED   Serratia marcescens NOT DETECTED NOT DETECTED    Haemophilus influenzae NOT DETECTED NOT DETECTED   Neisseria meningitidis NOT DETECTED NOT DETECTED   Pseudomonas aeruginosa NOT DETECTED NOT DETECTED   Stenotrophomonas maltophilia NOT DETECTED NOT DETECTED   Candida albicans NOT DETECTED NOT DETECTED   Candida auris NOT DETECTED NOT DETECTED   Candida glabrata NOT DETECTED NOT DETECTED   Candida krusei NOT DETECTED NOT DETECTED   Candida parapsilosis NOT DETECTED NOT DETECTED   Candida tropicalis NOT DETECTED NOT DETECTED   Cryptococcus neoformans/gattii NOT DETECTED NOT DETECTED   Vancomycin resistance NOT DETECTED NOT DETECTED    Sharin Mons, PharmD, BCPS, BCIDP Infectious Diseases Clinical Pharmacist Phone: [phone] 12/20/2023  8:12 AM

## 2023-12-21 DIAGNOSIS — L8990 Pressure ulcer of unspecified site, unspecified stage: Secondary | ICD-10-CM | POA: Insufficient documentation

## 2023-12-21 DIAGNOSIS — Z950 Presence of cardiac pacemaker: Secondary | ICD-10-CM | POA: Diagnosis not present

## 2023-12-21 DIAGNOSIS — J189 Pneumonia, unspecified organism: Secondary | ICD-10-CM | POA: Diagnosis not present

## 2023-12-21 DIAGNOSIS — I5021 Acute systolic (congestive) heart failure: Secondary | ICD-10-CM

## 2023-12-21 DIAGNOSIS — R7881 Bacteremia: Secondary | ICD-10-CM | POA: Insufficient documentation

## 2023-12-21 DIAGNOSIS — B952 Enterococcus as the cause of diseases classified elsewhere: Secondary | ICD-10-CM

## 2023-12-21 DIAGNOSIS — I4811 Longstanding persistent atrial fibrillation: Secondary | ICD-10-CM

## 2023-12-21 LAB — PROCALCITONIN: Procalcitonin: 4.94 ng/mL

## 2023-12-21 LAB — CBC WITH DIFFERENTIAL/PLATELET
Abs Immature Granulocytes: 0.06 10*3/uL (ref 0.00–0.07)
Basophils Absolute: 0.1 10*3/uL (ref 0.0–0.1)
Basophils Relative: 1 %
Eosinophils Absolute: 0.2 10*3/uL (ref 0.0–0.5)
Eosinophils Relative: 2 %
HCT: 35.8 % — ABNORMAL LOW (ref 39.0–52.0)
Hemoglobin: 11.3 g/dL — ABNORMAL LOW (ref 13.0–17.0)
Immature Granulocytes: 1 %
Lymphocytes Relative: 8 %
Lymphs Abs: 1 10*3/uL (ref 0.7–4.0)
MCH: 29.7 pg (ref 26.0–34.0)
MCHC: 31.6 g/dL (ref 30.0–36.0)
MCV: 94 fL (ref 80.0–100.0)
Monocytes Absolute: 0.9 10*3/uL (ref 0.1–1.0)
Monocytes Relative: 8 %
Neutro Abs: 9.6 10*3/uL — ABNORMAL HIGH (ref 1.7–7.7)
Neutrophils Relative %: 80 %
Platelets: 143 10*3/uL — ABNORMAL LOW (ref 150–400)
RBC: 3.81 MIL/uL — ABNORMAL LOW (ref 4.22–5.81)
RDW: 15 % (ref 11.5–15.5)
WBC: 11.8 10*3/uL — ABNORMAL HIGH (ref 4.0–10.5)
nRBC: 0 % (ref 0.0–0.2)

## 2023-12-21 LAB — BASIC METABOLIC PANEL
Anion gap: 9 (ref 5–15)
BUN: 24 mg/dL — ABNORMAL HIGH (ref 8–23)
CO2: 24 mmol/L (ref 22–32)
Calcium: 8.7 mg/dL — ABNORMAL LOW (ref 8.9–10.3)
Chloride: 102 mmol/L (ref 98–111)
Creatinine, Ser: 1.19 mg/dL (ref 0.61–1.24)
GFR, Estimated: 57 mL/min — ABNORMAL LOW (ref >60)
Glucose, Bld: 150 mg/dL — ABNORMAL HIGH (ref 70–99)
Potassium: 3.9 mmol/L (ref 3.5–5.1)
Sodium: 135 mmol/L (ref 135–145)

## 2023-12-21 LAB — BRAIN NATRIURETIC PEPTIDE: B Natriuretic Peptide: 820.2 pg/mL — ABNORMAL HIGH (ref 0.0–100.0)

## 2023-12-21 LAB — PROTIME-INR
INR: 3.8 — ABNORMAL HIGH (ref 0.8–1.2)
Prothrombin Time: 37.6 s — ABNORMAL HIGH (ref 11.4–15.2)

## 2023-12-21 MED ORDER — DICLOFENAC SODIUM 1 % EX GEL
2.0000 g | Freq: Four times a day (QID) | CUTANEOUS | Status: DC
  Administered 2023-12-21 – 2023-12-24 (×9): 2 g via TOPICAL
  Filled 2023-12-21: qty 100

## 2023-12-21 MED ORDER — FUROSEMIDE 10 MG/ML IJ SOLN
40.0000 mg | Freq: Once | INTRAMUSCULAR | Status: AC
  Administered 2023-12-21: 40 mg via INTRAVENOUS
  Filled 2023-12-21: qty 4

## 2023-12-21 NOTE — Evaluation (Signed)
 Clinical/Bedside Swallow Evaluation Patient Details  Name: Douglas [name] MRN: [id number] Date of Birth: 30-Jan-1928  Today's Date: 12/21/2023 Time: SLP Start Time (ACUTE ONLY): 1140 SLP Stop Time (ACUTE ONLY): 1207 SLP Time Calculation (min) (ACUTE ONLY): 27 min  Past Medical History:  Past Medical History:  Diagnosis Date   Acute upper GI bleed 05/18/2016   Aneurysm of thoracic aorta (HCC)    Ascending. 5.7 x 5.5cm - Bentall procedure   Aortic stenosis    Bioprosthetic AVR   Arthritis    Atrial fibrillation (HCC)    Documented on pacer interrogation 6/13   Complete heart block (HCC)    Status post pacemaker placement   Coronary atherosclerosis of native coronary artery    Multivessel - LIMA to LAD, SVG to OM, SVG to PDA   DJD (degenerative joint disease)    Esophageal dysmotility    Essential hypertension    Fibromyalgia    Gout    H/O arteriovenous malformation (AVM)    H/O hiatal hernia    Hemorrhoid    History of esophageal ulcer    History of skin cancer    Hx of adenomatous colonic polyps    Iron deficiency anemia due to chronic blood loss    Melena    Pacemaker-Medtronic    PMR (polymyalgia rheumatica) (HCC)    SIRS (systemic inflammatory response syndrome) (HCC) 01/16/2015   Past Surgical History:  Past Surgical History:  Procedure Laterality Date   AORTIC VALVE REPLACEMENT  01/24/12   APPENDECTOMY     BACK SURGERY     1965 or 1966 lumbar back surgery   BENTALL PROCEDURE  01/24/2012   Procedure: BENTALL PROCEDURE;  Surgeon: Kerin Perna, MD;  [facility];  Service: Open Heart Surgery;  Laterality: N/A;   BILATERAL KNEE ARTHROSCOPY     CATARACT EXTRACTION W/PHACO Left 12/03/2016   Procedure: CATARACT EXTRACTION PHACO AND INTRAOCULAR LENS PLACEMENT LEFT EYE;  Surgeon: Gemma Payor, MD;  Location: [facility];  Service: Ophthalmology;  Laterality: Left;  CDE:  10.87   CATARACT EXTRACTION W/PHACO Right 12/13/2016   Procedure: CATARACT EXTRACTION PHACO AND INTRAOCULAR  LENS PLACEMENT (IOC);  Surgeon: Gemma Payor, MD;  Location: [facility];  Service: Ophthalmology;  Laterality: Right;  right cde 11.78   CORONARY ARTERY BYPASS GRAFT  01/24/2012   Procedure: CORONARY ARTERY BYPASS GRAFTING (CABG);  Surgeon: Kerin Perna, MD;  Location: [facility] OR;  Service: Open Heart Surgery;  Laterality: N/A;   ESOPHAGOGASTRODUODENOSCOPY  06/17/2012   Procedure: ESOPHAGOGASTRODUODENOSCOPY (EGD);  Surgeon: Malissa Hippo, MD;  Location: [facility];  Service: Endoscopy;  Laterality: N/A;  ED   ESOPHAGOGASTRODUODENOSCOPY N/A 05/19/2016   Procedure: ESOPHAGOGASTRODUODENOSCOPY (EGD);  Surgeon: West Bali, MD;  Location: [facility];  Service: Endoscopy;  Laterality: N/A;   HEMORROIDECTOMY     HERNIA REPAIR     Left inguinal hernia repair     PACEMAKER PLACEMENT     Medtronic 4/13 - Dr. Johney Frame   PERMANENT PACEMAKER INSERTION N/A 02/01/2012   Procedure: PERMANENT PACEMAKER INSERTION;  Surgeon: Hillis Range, MD; Medtronic Adapta L model ADDRL 1 (serial number [id number])     HPI:  Brief Admission History:   89 year old male with history of Afib on warfarin, s/p AVR, CAD, HTN, h/o AVM, h/o esophageal ulcer, h/o CHB with pacemaker in place, GERD who presented to ED by home complaining of cough, chills and SOB that started yesterday    Assessment / Plan / Recommendation  Clinical Impression  Pt with a hx of pharyngeal and esophageal dysphagia dated back to 2012. He has had multiple objective swallow studies: both MBSS and barium swallow, with most recent MBSS noted 2024 and recommendations for mechanical soft and thin liquids. Pt with chronic esophageal dysmotility and esophageal retention per chart review. He reports this is the first onset of PNA since a childhood case. His biggest dysphagia complaints presently include globus sensation to solids and notes some solids go down with multiple swallows or thin liquid alternation and other times feeling like food is stuck in his throat. SLP  observed pt with ice chips, thin liquids, puree, solids and part of mechanical soft lunch tray. Pt did exhibit mulitple swallows to clear bites ans sips consistent with decreased swallowing efficiency. There were no overt s/sx of aspiration with POs, though suspect an instance of possible laryngeal penetration as pt with trace wet vocal quality x1, clearing with throat clear. Recommend continue dysphagia 3 (mechanical soft) and thin liquids with meds crushed in puree and safe swallowing precautions to maximize swallow safety and efficiency. SLP to follow up for diet tolerance and continued pt education.   SLP Visit Diagnosis: Dysphagia, pharyngoesophageal phase (R13.14)    Aspiration Risk  Mild aspiration risk;Moderate aspiration risk    Diet Recommendation   Thin;Dysphagia 3 (mechanical soft)  Medication Administration: Crushed with puree    Other  Recommendations Oral Care Recommendations: Oral care BID    Recommendations for follow up therapy are one component of a multi-disciplinary discharge planning process, led by the attending physician.  Recommendations may be updated based on patient status, additional functional criteria and insurance authorization.  Follow up Recommendations Follow physician's recommendations for discharge plan and follow up therapies      Assistance Recommended at Discharge    Functional Status Assessment Patient has had a recent decline in their functional status and demonstrates the ability to make significant improvements in function in a reasonable and predictable amount of time.  Frequency and Duration min 2x/week  2 weeks       Prognosis Prognosis for improved oropharyngeal function: Good Barriers to Reach Goals: Motivation      Swallow Study   General Date of Onset: 12/20/23 HPI: Brief Admission History:   89 year old male with history of Afib on warfarin, s/p AVR, CAD, HTN, h/o AVM, h/o esophageal ulcer, h/o CHB with pacemaker in place, GERD who  presented to ED by home complaining of cough, chills and SOB that started yesterday Type of Study: Bedside Swallow Evaluation Previous Swallow Assessment: MBSS 2012, 2024 Diet Prior to this Study: Dysphagia 3 (mechanical soft);Thin liquids (Level 0) Temperature Spikes Noted: No Respiratory Status: Nasal cannula History of Recent Intubation: No Behavior/Cognition: Alert;Cooperative;Pleasant mood Oral Cavity Assessment: Within Functional Limits Oral Care Completed by SLP: Yes Oral Cavity - Dentition: Missing dentition Vision: Functional for self-feeding Self-Feeding Abilities: Able to feed self Patient Positioning: Upright in chair Baseline Vocal Quality: Normal Volitional Cough: Congested Volitional Swallow: Able to elicit    Oral/Motor/Sensory Function Overall Oral Motor/Sensory Function: Within functional limits   Ice Chips Ice chips: Within functional limits Presentation: Spoon   Thin Liquid Thin Liquid: Impaired Presentation: Cup;Straw Pharyngeal  Phase Impairments: Suspected delayed Swallow;Multiple swallows    Nectar Thick Nectar Thick Liquid: Not tested   Honey Thick Honey Thick Liquid: Not tested   Puree Puree: Impaired Presentation: Spoon Pharyngeal Phase Impairments: Suspected delayed Swallow;Multiple swallows;Decreased hyoid-laryngeal movement   Solid     Solid: Impaired Presentation: Spoon Pharyngeal Phase Impairments:  Suspected delayed Swallow;Decreased hyoid-laryngeal movement;Multiple swallows;Cough - Delayed      Ardyth Gal MA, CCC-SLP Acute Rehabilitation Services   12/21/2023,12:41 PM

## 2023-12-21 NOTE — Consult Note (Signed)
 [facility]    Date of Admission:  12/19/2023     Reason for Consult: bacteremia    Referring Provider: Alvino Chapel     Lines:  ppm  Abx: 2/28-c ampicillin 2/27-c ceftriaxone   2/27-3/1 azith  Assessment: 89 yo male with pacer for complete heart block, afib on coumadin, hx Bentall procedure/AVR, cad s/p cabg, transferred from  on 3/1 (admitted there 2/27) for e faecalis bacteremia        # E faecalis bacteremia # hx AVR (edward valve) # need to r/o PV endocarditis as well as CIED infection  Acute onset febrile syndrome admitted 2/27 and found to have bcx e faecalis No clear source No obvious metastastic focus Need repeat bcx and tee  # Acute hypoxic respiratory failure  # CAP - likely 2/2 above  - tte does show ef 40% and I suspect some component of chf as well -- cards following - 2/28 strep pneumo ag negative  - Fu legionella ag - abtx as above, can complete 3 days of azithromycin     Plan: -continue ceftriaxone ampicillin -appreciate cardiology input -will need tee -r/u repeat bcx from 3/1    ------------------------------------------------ Principal Problem:   CAP (community acquired pneumonia) Active Problems:   Essential hypertension, benign   Coronary atherosclerosis of native coronary artery   Atrial fibrillation (HCC)   Pacemaker-Medtronic   Aortic valve replaced   Chronic anticoagulation - Coumadin, CHADS2VASC=5   History of esophageal ulcer   H/O arteriovenous malformation (AVM)   Oropharyngeal dysphagia   AKI (acute kidney injury) (HCC)   Leukocytosis   Lactic acidosis   Generalized weakness   Hyperglycemia   Bacteremia due to Enterococcus   Acute systolic (congestive) heart failure (HCC)   Pressure injury of skin    HPI: Douglas [name] is a 89 y.o. male here with e faecalis bacteremia in setting ppm and hx avr  Patient felt acutely ill while on his tractor, shaking chill and malaise and  came to [facility] the same day  He was at normal baseline health before that  Wbc 16 on admission without documented fever Bcx e faecalis No focal pain, headache, cough, dysuria, hematuria Feeling better with abx  Tte no obvious vegetation     Family History  Problem Relation Age of Onset   Kidney disease Mother    Heart disease Father    Anesthesia problems Neg Hx    Hypotension Neg Hx    Malignant hyperthermia Neg Hx    Pseudochol deficiency Neg Hx     Social History   Tobacco Use   Smoking status: Never   Smokeless tobacco: Never  Vaping Use   Vaping status: Never Used  Substance Use Topics   Alcohol use: No    Alcohol/week: 0.0 standard drinks of alcohol   Drug use: No    Allergies  Allergen Reactions   Imdur [Isosorbide Nitrate] Other (See Comments)    Nausea and lightheaded     Review of Systems: ROS All Other ROS was negative, except mentioned above   Past Medical History:  Diagnosis Date   Acute upper GI bleed 05/18/2016   Aneurysm of thoracic aorta (HCC)    Ascending. 5.7 x 5.5cm - Bentall procedure   Aortic stenosis    Bioprosthetic AVR   Arthritis    Atrial fibrillation (HCC)    Documented on pacer interrogation 6/13   Complete heart block (HCC)  Status post pacemaker placement   Coronary atherosclerosis of native coronary artery    Multivessel - LIMA to LAD, SVG to OM, SVG to PDA   DJD (degenerative joint disease)    Esophageal dysmotility    Essential hypertension    Fibromyalgia    Gout    H/O arteriovenous malformation (AVM)    H/O hiatal hernia    Hemorrhoid    History of esophageal ulcer    History of skin cancer    Hx of adenomatous colonic polyps    Iron deficiency anemia due to chronic blood loss    Melena    Pacemaker-Medtronic    PMR (polymyalgia rheumatica) (HCC)    SIRS (systemic inflammatory response syndrome) (HCC) 01/16/2015       Scheduled Meds:  allopurinol  300 mg Oral Daily   azithromycin  500 mg Oral  Daily   guaiFENesin  600 mg Oral BID   metoprolol succinate  50 mg Oral Daily   pantoprazole  40 mg Oral QPM   pravastatin  20 mg Oral QPM   senna-docusate  1 tablet Oral BID   Warfarin - Pharmacist Dosing Inpatient   Does not apply q1600   Continuous Infusions:  ampicillin (OMNIPEN) IV 2 g (12/21/23 1610)   cefTRIAXone (ROCEPHIN)  IV 2 g (12/21/23 1001)   lactated ringers Stopped (12/19/23 0923)   PRN Meds:.acetaminophen **OR** acetaminophen, bisacodyl, dextromethorphan, fentaNYL (SUBLIMAZE) injection, ipratropium-albuterol, ondansetron **OR** ondansetron (ZOFRAN) IV, oxyCODONE, traZODone   OBJECTIVE: Blood pressure (!) 165/81, pulse 75, temperature 97.7 F (36.5 C), temperature source Oral, resp. rate 18, height 5\' 6"  (1.676 m), weight 94.3 kg, SpO2 100%.  Physical Exam General/constitutional: no distress, pleasant HEENT: Normocephalic, PER, Conj Clear, EOMI, Oropharynx clear Neck supple CV: rrr no mrg Lungs: clear to auscultation, normal respiratory effort Abd: Soft, Nontender Ext: no edema Skin: No Rash Neuro: nonfocal MSK: no peripheral joint swelling/tenderness/warmth; back spines nontender     Lab Results Lab Results  Component Value Date   WBC 11.8 (H) 12/21/2023   HGB 11.3 (L) 12/21/2023   HCT 35.8 (L) 12/21/2023   MCV 94.0 12/21/2023   PLT 143 (L) 12/21/2023    Lab Results  Component Value Date   CREATININE 1.19 12/21/2023   BUN 24 (H) 12/21/2023   NA 135 12/21/2023   K 3.9 12/21/2023   CL 102 12/21/2023   CO2 24 12/21/2023    Lab Results  Component Value Date   ALT 13 08/29/2023   AST 22 08/29/2023   ALKPHOS 155 (H) 08/29/2023   BILITOT 0.6 08/29/2023      Microbiology: Recent Results (from the past 240 hours)  Resp panel by RT-PCR (RSV, Flu A&B, Covid) Anterior Nasal Swab     Status: None   Collection Time: 12/19/23  5:31 AM   Specimen: Anterior Nasal Swab  Result Value Ref Range Status   SARS Coronavirus 2 by RT PCR NEGATIVE NEGATIVE  Final    Comment: (NOTE) SARS-CoV-2 target nucleic acids are NOT DETECTED.  The SARS-CoV-2 RNA is generally detectable in upper respiratory specimens during the acute phase of infection. The lowest concentration of SARS-CoV-2 viral copies this assay can detect is 138 copies/mL. A negative result does not preclude SARS-Cov-2 infection and should not be used as the sole basis for treatment or other patient management decisions. A negative result may occur with  improper specimen collection/handling, submission of specimen other than nasopharyngeal swab, presence of viral mutation(s) within the areas targeted by this assay, and inadequate number  of viral copies(<138 copies/mL). A negative result must be combined with clinical observations, patient history, and epidemiological information. The expected result is Negative.  Fact Sheet for Patients:  BloggerCourse.com  Fact Sheet for Healthcare Providers:  SeriousBroker.it  This test is no t yet approved or cleared by the [place] FDA and  has been authorized for detection and/or diagnosis of SARS-CoV-2 by FDA under an Emergency Use Authorization (EUA). This EUA will remain  in effect (meaning this test can be used) for the duration of the COVID-19 declaration under Section 564(b)(1) of the Act, 21 U.S.C.section 360bbb-3(b)(1), unless the authorization is terminated  or revoked sooner.       Influenza A by PCR NEGATIVE NEGATIVE Final   Influenza B by PCR NEGATIVE NEGATIVE Final    Comment: (NOTE) The Xpert Xpress SARS-CoV-2/FLU/RSV plus assay is intended as an aid in the diagnosis of influenza from Nasopharyngeal swab specimens and should not be used as a sole basis for treatment. Nasal washings and aspirates are unacceptable for Xpert Xpress SARS-CoV-2/FLU/RSV testing.  Fact Sheet for Patients: BloggerCourse.com  Fact Sheet for Healthcare  Providers: SeriousBroker.it  This test is not yet approved or cleared by the [place] FDA and has been authorized for detection and/or diagnosis of SARS-CoV-2 by FDA under an Emergency Use Authorization (EUA). This EUA will remain in effect (meaning this test can be used) for the duration of the COVID-19 declaration under Section 564(b)(1) of the Act, 21 U.S.C. section 360bbb-3(b)(1), unless the authorization is terminated or revoked.     Resp Syncytial Virus by PCR NEGATIVE NEGATIVE Final    Comment: (NOTE) Fact Sheet for Patients: BloggerCourse.com  Fact Sheet for Healthcare Providers: SeriousBroker.it  This test is not yet approved or cleared by the [place] FDA and has been authorized for detection and/or diagnosis of SARS-CoV-2 by FDA under an Emergency Use Authorization (EUA). This EUA will remain in effect (meaning this test can be used) for the duration of the COVID-19 declaration under Section 564(b)(1) of the Act, 21 U.S.C. section 360bbb-3(b)(1), unless the authorization is terminated or revoked.  Performed at [facility], [address], [place], [place] [address]   Blood Culture (routine x 2)     Status: None (Preliminary result)   Collection Time: 12/19/23  5:31 AM   Specimen: BLOOD  Result Value Ref Range Status   Specimen Description BLOOD RIGHT ANTECUBITAL  Final   Special Requests   Final    BOTTLES DRAWN AEROBIC AND ANAEROBIC Blood Culture adequate volume   Culture   Final    NO GROWTH 2 DAYS Performed at [facility], [address], [place], [place] [address]    Report Status PENDING  Incomplete  Blood Culture (routine x 2)     Status: None (Preliminary result)   Collection Time: 12/19/23  6:06 AM   Specimen: BLOOD  Result Value Ref Range Status   Specimen Description   Final    BLOOD BLOOD RIGHT HAND Performed at [facility], [address] [place], [place]  [address]    Special Requests   Final    BOTTLES DRAWN AEROBIC AND ANAEROBIC Blood Culture adequate volume Performed at [facility], [address], [place], [place] [address]    Culture  Setup Time   Final    GRAM POSITIVE COCCI IN BOTH AEROBIC AND ANAEROBIC BOTTLES Gram Stain Report Called to,Read Back By and Verified With: PRISTINA BINES [id number], VIRAY,J Organism ID to follow CRITICAL RESULT CALLED TO, READ BACK BY  AND VERIFIED WITH: P BINES,RN@0028  12/20/23 MK    Culture   Final    GRAM POSITIVE COCCI TOO YOUNG TO READ Performed at [facility], [address], [place], [place] [address]    Report Status PENDING  Incomplete  Blood Culture ID Panel (Reflexed)     Status: Abnormal   Collection Time: 12/19/23  6:06 AM  Result Value Ref Range Status   Enterococcus faecalis DETECTED (A) NOT DETECTED Final    Comment: CRITICAL RESULT CALLED TO, READ BACK BY AND VERIFIED WITH: P BINES,RN@0027  12/20/23 MK    Enterococcus Faecium NOT DETECTED NOT DETECTED Final   Listeria monocytogenes NOT DETECTED NOT DETECTED Final   Staphylococcus species NOT DETECTED NOT DETECTED Final   Staphylococcus aureus (BCID) NOT DETECTED NOT DETECTED Final   Staphylococcus epidermidis NOT DETECTED NOT DETECTED Final   Staphylococcus lugdunensis NOT DETECTED NOT DETECTED Final   Streptococcus species NOT DETECTED NOT DETECTED Final   Streptococcus agalactiae NOT DETECTED NOT DETECTED Final   Streptococcus pneumoniae NOT DETECTED NOT DETECTED Final   Streptococcus pyogenes NOT DETECTED NOT DETECTED Final   A.calcoaceticus-baumannii NOT DETECTED NOT DETECTED Final   Bacteroides fragilis NOT DETECTED NOT DETECTED Final   Enterobacterales NOT DETECTED NOT DETECTED Final   Enterobacter cloacae complex NOT DETECTED NOT DETECTED Final   Escherichia coli NOT DETECTED NOT DETECTED Final   Klebsiella aerogenes NOT DETECTED NOT DETECTED Final   Klebsiella oxytoca NOT DETECTED NOT DETECTED Final    Klebsiella pneumoniae NOT DETECTED NOT DETECTED Final   Proteus species NOT DETECTED NOT DETECTED Final   Salmonella species NOT DETECTED NOT DETECTED Final   Serratia marcescens NOT DETECTED NOT DETECTED Final   Haemophilus influenzae NOT DETECTED NOT DETECTED Final   Neisseria meningitidis NOT DETECTED NOT DETECTED Final   Pseudomonas aeruginosa NOT DETECTED NOT DETECTED Final   Stenotrophomonas maltophilia NOT DETECTED NOT DETECTED Final   Candida albicans NOT DETECTED NOT DETECTED Final   Candida auris NOT DETECTED NOT DETECTED Final   Candida glabrata NOT DETECTED NOT DETECTED Final   Candida krusei NOT DETECTED NOT DETECTED Final   Candida parapsilosis NOT DETECTED NOT DETECTED Final   Candida tropicalis NOT DETECTED NOT DETECTED Final   Cryptococcus neoformans/gattii NOT DETECTED NOT DETECTED Final   Vancomycin resistance NOT DETECTED NOT DETECTED Final    Comment: Performed at [facility], [address] [place], [place] [address]     Serology:    Imaging: If present, new imagings (plain films, ct scans, and mri) have been personally visualized and interpreted; radiology reports have been reviewed. Decision making incorporated into the Impression / Recommendations.  2/27 cxr 1. Similar retrocardiac and mild patchy right basilar opacities could reflect atelectasis or infiltrate. 2. Similar blunting of the left costophrenic angle could reflect scarring or a small effusion.   2/28 tte  1. Left ventricular ejection fraction, by estimation, is 40%. The left  ventricle has mildly decreased function. Left ventricular endocardial  border not optimally defined to evaluate regional wall motion. There is  moderate left ventricular hypertrophy.  Left ventricular diastolic parameters are indeterminate. There is the  interventricular septum is flattened in systole and diastole, consistent  with right ventricular pressure and volume overload.   2. Right ventricular systolic  function was not well visualized. The right  ventricular size is not well visualized. There is moderately elevated  pulmonary artery systolic pressure.   3. The mitral valve was not well visualized. No evidence of mitral valve  regurgitation. No evidence of mitral stenosis.   4. The tricuspid valve is abnormal.   5. History of Bentall procedure with ascending aorta using a 28-mm  Gelweave Valsalva graft and 23-mm Edwards pericardial valve      . The aortic valve has been repaired/replaced. Aortic valve  regurgitation is not visualized. No aortic stenosis is present. There is a  23 mm Edwards valve present in the aortic position.   6. The pulmonic valve was abnormal.   7. The inferior vena cava is normal in size with greater than 50%  respiratory variability, suggesting right atrial pressure of 3 mmHg.       Raymondo Band, MD [facility] [phone] pager    12/21/2023, 10:55 AM

## 2023-12-21 NOTE — Progress Notes (Signed)
 PROGRESS NOTE    Douglas [name]  ZOX:[id number] DOB: Jan 11, 1928 DOA: 12/19/2023 PCP: Carylon Perches, MD     Brief Narrative:  Douglas [name] is a 89 year old male with history of Afib on warfarin, s/p AVR, CAD, HTN, h/o AVM, h/o esophageal ulcer, h/o CHB with pacemaker in place, GERD who presented to ED by home complaining of cough, chills and SOB that started yesterday.  He denies having chest pain.  He reported overall malaise and weakness.  He has no known sick contacts.  He denies having chest pain symptoms.  He reports that he is fairly active at his age and was working outside on a tractor yesterday.  He has flu like symptoms.  He has a headache and a sore throat.  On arrival he was treated for sepsis due to pneumonia with leukocytosis and abnormal chest xray.  He had abnormal lung sounds and admission was requested for further management.  Workup revealed sepsis secondary to Enterobacter faecalis bacteremia.  Infectious disease was consulted, transferred to [facility] for cardiology/EP consultation.  New events last 24 hours / Subjective: Patient without any new physical complaints.  Assessment & Plan:   Principal Problem:   CAP (community acquired pneumonia) Active Problems:   Essential hypertension, benign   Coronary atherosclerosis of native coronary artery   Atrial fibrillation (HCC)   Pacemaker-[employer]tronic   Aortic valve replaced   Chronic anticoagulation - Coumadin, CHADS2VASC=5   History of esophageal ulcer   H/O arteriovenous malformation (AVM)   Oropharyngeal dysphagia   AKI (acute kidney injury) (HCC)   Leukocytosis   Lactic acidosis   Generalized weakness   Hyperglycemia   Bacteremia due to Enterococcus   Acute systolic (congestive) heart failure (HCC)   Pressure injury of skin   Sepsis secondary to Enterobacter faecalis bacteremia -Infectious disease consulted, started on ampicillin -Cardiology consulted, planning for TEE next week  Community-acquired  pneumonia -Ceftriaxone, azithromycin  History of complete heart block -Status post pacemaker 2013  Paroxysmal A-fib -Warfarin, metoprolol  Acquired thrombophilia -Warfarin  Hyperlipidemia -Pravastatin  Gout -Allopurinol  GERD -PPI     In agreement with assessment of the pressure ulcer as below:  Pressure Injury 12/20/23 Perineum Bilateral Stage 1 -  Intact skin with non-blanchable redness of a localized area usually over a bony prominence. redness nonblancing skin intact redness (Active)  12/20/23 1838  Location: Perineum  Location Orientation: Bilateral  Staging: Stage 1 -  Intact skin with non-blanchable redness of a localized area usually over a bony prominence.  Wound Description (Comments): redness nonblancing skin intact redness  Present on Admission: Yes  Dressing Type Foam - Lift dressing to assess site every shift 12/21/23 0935         DVT prophylaxis: Coumadin   Code Status: Full code Family Communication: None at bedside Disposition Plan: Pending TEE planned for next week Status is: Inpatient Remains inpatient appropriate because: IV antibiotics    Antimicrobials:  Anti-infectives (From admission, onward)    Start     Dose/Rate Route Frequency Ordered Stop   12/20/23 2200  cefTRIAXone (ROCEPHIN) 2 g in sodium chloride 0.9 % 100 mL IVPB  Status:  Discontinued        2 g 200 mL/hr over 30 Minutes Intravenous Every 12 hours 12/20/23 0801 12/20/23 1019   12/20/23 1200  ampicillin (OMNIPEN) 2 g in sodium chloride 0.9 % 100 mL IVPB        2 g 300 mL/hr over 20 Minutes Intravenous Every 6 hours  12/20/23 0801     12/20/23 1115  cefTRIAXone (ROCEPHIN) 2 g in sodium chloride 0.9 % 100 mL IVPB        2 g 200 mL/hr over 30 Minutes Intravenous Every 12 hours 12/20/23 1019     12/20/23 1000  azithromycin (ZITHROMAX) tablet 500 mg        500 mg Oral Daily 12/19/23 0722 12/24/23 0959   12/20/23 0800  cefTRIAXone (ROCEPHIN) 2 g in sodium chloride 0.9 % 100  mL IVPB  Status:  Discontinued        2 g 200 mL/hr over 30 Minutes Intravenous Every 24 hours 12/19/23 0753 12/20/23 0801   12/19/23 0630  cefTRIAXone (ROCEPHIN) 1 g in sodium chloride 0.9 % 100 mL IVPB        1 g 200 mL/hr over 30 Minutes Intravenous  Once 12/19/23 0625 12/19/23 0715   12/19/23 0630  azithromycin (ZITHROMAX) 500 mg in sodium chloride 0.9 % 250 mL IVPB        500 mg 250 mL/hr over 60 Minutes Intravenous  Once 12/19/23 0625 12/19/23 0819        Objective: Vitals:   12/20/23 2341 12/21/23 0429 12/21/23 0731 12/21/23 1244  BP:  (!) 141/88 (!) 165/81 (!) 157/79  Pulse: 76 75 75 71  Resp: 20 19 18 19   Temp: 98 F (36.7 C) 98.4 F (36.9 C) 97.7 F (36.5 C) (!) 97.5 F (36.4 C)  TempSrc: Oral Oral Oral Oral  SpO2: 100% 99% 100% 98%  Weight:  94.3 kg    Height:        Intake/Output Summary (Last 24 hours) at 12/21/2023 1321 Last data filed at 12/21/2023 0310 Gross per 24 hour  Intake 1232.03 ml  Output 400 ml  Net 832.03 ml   Filed Weights   12/19/23 0514 12/20/23 0500 12/21/23 0429  Weight: 88.5 kg 91.1 kg 94.3 kg    Examination:  General exam: Appears calm and comfortable  Respiratory system: Clear to auscultation. Respiratory effort normal. No respiratory distress. No conversational dyspnea.  Cardiovascular system: S1 & S2 heard, irregular rhythm Gastrointestinal system: Abdomen is nondistended, soft  Central nervous system: Alert and oriented. No focal neurological deficits. Speech clear.  Extremities: Symmetric in appearance  Skin: No rashes, lesions or ulcers on exposed skin  Psychiatry: Judgement and insight appear normal. Mood & affect appropriate.   Data Reviewed: I have personally reviewed following labs and imaging studies  CBC: Recent Labs  Lab 12/19/23 0531 12/20/23 0502 12/21/23 0849  WBC 14.9* 16.4* 11.8*  NEUTROABS 13.0* 13.2* 9.6*  HGB 12.3* 12.7* 11.3*  HCT 39.4 40.5 35.8*  MCV 95.4 96.4 94.0  PLT 174 114* 143*   Basic  Metabolic Panel: Recent Labs  Lab 12/19/23 0531 12/20/23 0502 12/21/23 0849  NA 135 133* 135  K 3.8 4.1 3.9  CL 105 106 102  CO2 23 19* 24  GLUCOSE 131* 103* 150*  BUN 29* 27* 24*  CREATININE 1.35* 1.18 1.19  CALCIUM 8.6* 8.6* 8.7*  MG  --  1.7  --    GFR: Estimated Creatinine Clearance: 40.8 mL/min (by C-G formula based on SCr of 1.19 mg/dL). Liver Function Tests: No results for input(s): "AST", "ALT", "ALKPHOS", "BILITOT", "PROT", "ALBUMIN" in the last 168 hours. No results for input(s): "LIPASE", "AMYLASE" in the last 168 hours. No results for input(s): "AMMONIA" in the last 168 hours. Coagulation Profile: Recent Labs  Lab 12/19/23 0531 12/20/23 0502 12/21/23 0849  INR 2.7* 3.5* 3.8*  Cardiac Enzymes: No results for input(s): "CKTOTAL", "CKMB", "CKMBINDEX", "TROPONINI" in the last 168 hours. BNP (last 3 results) No results for input(s): "PROBNP" in the last 8760 hours. HbA1C: No results for input(s): "HGBA1C" in the last 72 hours. CBG: No results for input(s): "GLUCAP" in the last 168 hours. Lipid Profile: No results for input(s): "CHOL", "HDL", "LDLCALC", "TRIG", "CHOLHDL", "LDLDIRECT" in the last 72 hours. Thyroid Function Tests: No results for input(s): "TSH", "T4TOTAL", "FREET4", "T3FREE", "THYROIDAB" in the last 72 hours. Anemia Panel: No results for input(s): "VITAMINB12", "FOLATE", "FERRITIN", "TIBC", "IRON", "RETICCTPCT" in the last 72 hours. Sepsis Labs: Recent Labs  Lab 12/19/23 0532 12/19/23 0729 12/19/23 0829 12/19/23 1307 12/19/23 1810 12/20/23 0502 12/21/23 0223  PROCALCITON  --  0.12  --   --   --  7.46 4.94  LATICACIDVEN 2.6*  --  2.9* 2.3* 2.4*  --   --     Recent Results (from the past 240 hours)  Resp panel by RT-PCR (RSV, Flu A&B, Covid) Anterior Nasal Swab     Status: None   Collection Time: 12/19/23  5:31 AM   Specimen: Anterior Nasal Swab  Result Value Ref Range Status   SARS Coronavirus 2 by RT PCR NEGATIVE NEGATIVE Final     Comment: (NOTE) SARS-CoV-2 target nucleic acids are NOT DETECTED.  The SARS-CoV-2 RNA is generally detectable in upper respiratory specimens during the acute phase of infection. The lowest concentration of SARS-CoV-2 viral copies this assay can detect is 138 copies/mL. A negative result does not preclude SARS-Cov-2 infection and should not be used as the sole basis for treatment or other patient management decisions. A negative result may occur with  improper specimen collection/handling, submission of specimen other than nasopharyngeal swab, presence of viral mutation(s) within the areas targeted by this assay, and inadequate number of viral copies(<138 copies/mL). A negative result must be combined with clinical observations, patient history, and epidemiological information. The expected result is Negative.  Fact Sheet for Patients:  BloggerCourse.com  Fact Sheet for Healthcare Providers:  SeriousBroker.it  This test is no t yet approved or cleared by the [place] FDA and  has been authorized for detection and/or diagnosis of SARS-CoV-2 by FDA under an Emergency Use Authorization (EUA). This EUA will remain  in effect (meaning this test can be used) for the duration of the COVID-19 declaration under Section 564(b)(1) of the Act, 21 U.S.C.section 360bbb-3(b)(1), unless the authorization is terminated  or revoked sooner.       Influenza A by PCR NEGATIVE NEGATIVE Final   Influenza B by PCR NEGATIVE NEGATIVE Final    Comment: (NOTE) The Xpert Xpress SARS-CoV-2/FLU/RSV plus assay is intended as an aid in the diagnosis of influenza from Nasopharyngeal swab specimens and should not be used as a sole basis for treatment. Nasal washings and aspirates are unacceptable for Xpert Xpress SARS-CoV-2/FLU/RSV testing.  Fact Sheet for Patients: BloggerCourse.com  Fact Sheet for Healthcare  Providers: SeriousBroker.it  This test is not yet approved or cleared by the [place] FDA and has been authorized for detection and/or diagnosis of SARS-CoV-2 by FDA under an Emergency Use Authorization (EUA). This EUA will remain in effect (meaning this test can be used) for the duration of the COVID-19 declaration under Section 564(b)(1) of the Act, 21 U.S.C. section 360bbb-3(b)(1), unless the authorization is terminated or revoked.     Resp Syncytial Virus by PCR NEGATIVE NEGATIVE Final    Comment: (NOTE) Fact Sheet for Patients: BloggerCourse.com  Fact Sheet for Healthcare  Providers: SeriousBroker.it  This test is not yet approved or cleared by the [place] and has been authorized for detection and/or diagnosis of SARS-CoV-2 by FDA under an Emergency Use Authorization (EUA). This EUA will remain in effect (meaning this test can be used) for the duration of the COVID-19 declaration under Section 564(b)(1) of the Act, 21 U.S.C. section 360bbb-3(b)(1), unless the authorization is terminated or revoked.  Performed at [facility], [address] [place], [place] [address]   Blood Culture (routine x 2)     Status: None (Preliminary result)   Collection Time: 12/19/23  5:31 AM   Specimen: BLOOD  Result Value Ref Range Status   Specimen Description BLOOD RIGHT ANTECUBITAL  Final   Special Requests   Final    BOTTLES DRAWN AEROBIC AND ANAEROBIC Blood Culture adequate volume   Culture   Final    NO GROWTH 2 DAYS Performed at [facility], [address] [place], [place] [address]    Report Status PENDING  Incomplete  Blood Culture (routine x 2)     Status: Abnormal (Preliminary result)   Collection Time: 12/19/23  6:06 AM   Specimen: BLOOD  Result Value Ref Range Status   Specimen Description   Final    BLOOD BLOOD RIGHT HAND Performed at [facility], [address], [place], [place]  [address]    Special Requests   Final    BOTTLES DRAWN AEROBIC AND ANAEROBIC Blood Culture adequate volume Performed at [facility], [address], [place], [place] [address]    Culture  Setup Time   Final    GRAM POSITIVE COCCI IN BOTH AEROBIC AND ANAEROBIC BOTTLES Gram Stain Report Called to,Read Back By and Verified With: PRISTINA BINES [id number], VIRAY,J Organism ID to follow CRITICAL RESULT CALLED TO, READ BACK BY AND VERIFIED WITH: P BINES,RN@0028  12/20/23 MK    Culture (A)  Final    ENTEROCOCCUS FAECALIS SUSCEPTIBILITIES TO FOLLOW Performed at [facility], [address], [place], [place] [address]    Report Status PENDING  Incomplete  Blood Culture ID Panel (Reflexed)     Status: Abnormal   Collection Time: 12/19/23  6:06 AM  Result Value Ref Range Status   Enterococcus faecalis DETECTED (A) NOT DETECTED Final    Comment: CRITICAL RESULT CALLED TO, READ BACK BY AND VERIFIED WITH: P BINES,RN@0027  12/20/23 MK    Enterococcus Faecium NOT DETECTED NOT DETECTED Final   Listeria monocytogenes NOT DETECTED NOT DETECTED Final   Staphylococcus species NOT DETECTED NOT DETECTED Final   Staphylococcus aureus (BCID) NOT DETECTED NOT DETECTED Final   Staphylococcus epidermidis NOT DETECTED NOT DETECTED Final   Staphylococcus lugdunensis NOT DETECTED NOT DETECTED Final   Streptococcus species NOT DETECTED NOT DETECTED Final   Streptococcus agalactiae NOT DETECTED NOT DETECTED Final   Streptococcus pneumoniae NOT DETECTED NOT DETECTED Final   Streptococcus pyogenes NOT DETECTED NOT DETECTED Final   A.calcoaceticus-baumannii NOT DETECTED NOT DETECTED Final   Bacteroides fragilis NOT DETECTED NOT DETECTED Final   Enterobacterales NOT DETECTED NOT DETECTED Final   Enterobacter cloacae complex NOT DETECTED NOT DETECTED Final   Escherichia coli NOT DETECTED NOT DETECTED Final   Klebsiella aerogenes NOT DETECTED NOT DETECTED Final   Klebsiella oxytoca NOT DETECTED NOT DETECTED  Final   Klebsiella pneumoniae NOT DETECTED NOT DETECTED Final   Proteus species NOT DETECTED NOT DETECTED Final   Salmonella species NOT DETECTED NOT DETECTED Final   Serratia marcescens NOT DETECTED NOT DETECTED Final  Haemophilus influenzae NOT DETECTED NOT DETECTED Final   Neisseria meningitidis NOT DETECTED NOT DETECTED Final   Pseudomonas aeruginosa NOT DETECTED NOT DETECTED Final   Stenotrophomonas maltophilia NOT DETECTED NOT DETECTED Final   Candida albicans NOT DETECTED NOT DETECTED Final   Candida auris NOT DETECTED NOT DETECTED Final   Candida glabrata NOT DETECTED NOT DETECTED Final   Candida krusei NOT DETECTED NOT DETECTED Final   Candida parapsilosis NOT DETECTED NOT DETECTED Final   Candida tropicalis NOT DETECTED NOT DETECTED Final   Cryptococcus neoformans/gattii NOT DETECTED NOT DETECTED Final   Vancomycin resistance NOT DETECTED NOT DETECTED Final    Comment: Performed at [facility], [address] [place], [place] [address]      Radiology Studies: ECHOCARDIOGRAM COMPLETE Result Date: 12/20/2023    ECHOCARDIOGRAM REPORT   Patient Name:   Douglas [name] Date of Exam: 12/20/2023 Medical Rec #:  [id number]     Height:       66.0 in Accession #:    [id number]    Weight:       200.8 lb Date of Birth:  Sep 08, 1928     BSA:          2.003 m Patient Age:    89 years      BP:           150/79 mmHg Patient Gender: M             HR:           79 bpm. Exam Location:  Inpatient Procedure: 2D Echo, Cardiac Doppler, Color Doppler and Intracardiac            Opacification Agent (Both Spectral and Color Flow Doppler were            utilized during procedure). Indications:    Bacteremia  History:        Patient has prior history of Echocardiogram examinations, most                 recent 09/10/2023. Risk Factors:Hypertension.                 Aortic Valve: 23 mm Edwards valve is present in the aortic                 position.  Sonographer:    Karma Ganja Referring Phys: [id number] Cleora Fleet  Sonographer Comments: Technically difficult study due to poor echo windows. IMPRESSIONS  1. Left ventricular ejection fraction, by estimation, is 40%. The left ventricle has mildly decreased function. Left ventricular endocardial border not optimally defined to evaluate regional wall motion. There is moderate left ventricular hypertrophy. Left ventricular diastolic parameters are indeterminate. There is the interventricular septum is flattened in systole and diastole, consistent with right ventricular pressure and volume overload.  2. Right ventricular systolic function was not well visualized. The right ventricular size is not well visualized. There is moderately elevated pulmonary artery systolic pressure.  3. The mitral valve was not well visualized. No evidence of mitral valve regurgitation. No evidence of mitral stenosis.  4. The tricuspid valve is abnormal.  5. 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 The aortic valve has been repaired/replaced. Aortic valve regurgitation is not visualized. No aortic stenosis is present. There is a 23 mm Edwards valve present in the aortic position.  6. The pulmonic valve was abnormal.  7. The inferior  vena cava is normal in size with greater than 50% respiratory variability, suggesting right atrial pressure of 3 mmHg. FINDINGS  Left Ventricle: Left ventricular ejection fraction, by estimation, is 40%. The left ventricle has mildly decreased function. Left ventricular endocardial border not optimally defined to evaluate regional wall motion. Definity contrast agent was given IV  to delineate the left ventricular endocardial borders. Strain imaging was not performed. The left ventricular internal cavity size was normal in size. There is moderate left ventricular hypertrophy. The interventricular septum is flattened in systole and diastole, consistent with right ventricular pressure  and volume overload. Left ventricular diastolic parameters are indeterminate. Right Ventricle: The right ventricular size is not well visualized. Right vetricular wall thickness was not well visualized. Right ventricular systolic function was not well visualized. There is moderately elevated pulmonary artery systolic pressure. The  tricuspid regurgitant velocity is 3.50 m/s, and with an assumed right atrial pressure of 3 mmHg, the estimated right ventricular systolic pressure is 52.0 mmHg. Left Atrium: Left atrial size was not well visualized. Right Atrium: Right atrial size was not well visualized. Pericardium: There is no evidence of pericardial effusion. Mitral Valve: The mitral valve was not well visualized. No evidence of mitral valve regurgitation. No evidence of mitral valve stenosis. Tricuspid Valve: The tricuspid valve is abnormal. Tricuspid valve regurgitation is mild . No evidence of tricuspid stenosis. 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 The aortic valve has been repaired/replaced. Aortic valve regurgitation is not visualized. No aortic stenosis is present. Aortic valve mean gradient measures 1.0 mmHg. Aortic valve peak gradient measures 2.0 mmHg. Aortic valve area, by VTI measures 2.79 cm. There is a 23 mm Edwards valve present in the aortic position. Pulmonic Valve: The pulmonic valve was abnormal. Pulmonic valve regurgitation is mild. No evidence of pulmonic stenosis. Aorta: The aortic root was not well visualized. Venous: The inferior vena cava is normal in size with greater than 50% respiratory variability, suggesting right atrial pressure of 3 mmHg. IAS/Shunts: The interatrial septum was not well visualized. Additional Comments: 3D imaging was not performed. A device lead is visualized in the right atrium and right ventricle.  LEFT VENTRICLE PLAX 2D LVIDd:         4.70 cm      Diastology LVIDs:          3.70 cm      LV e' medial:    8.27 cm/s LV PW:         1.30 cm      LV E/e' medial:  15.1 LV IVS:        1.30 cm      LV e' lateral:   7.83 cm/s LVOT diam:     1.90 cm      LV E/e' lateral: 16.0 LV SV:         33 LV SV Index:   17 LVOT Area:     2.84 cm  LV Volumes (MOD) LV vol d, MOD A2C: 87.5 ml LV vol d, MOD A4C: 114.0 ml LV vol s, MOD A2C: 44.2 ml LV vol s, MOD A4C: 67.6 ml LV SV MOD A2C:     43.3 ml LV SV MOD A4C:     114.0 ml LV SV MOD BP:      46.3 ml RIGHT VENTRICLE            IVC RV Basal diam:  4.50 cm  IVC diam: 1.60 cm RV S prime:     5.87 cm/s TAPSE (M-mode): 1.6 cm LEFT ATRIUM              Index        RIGHT ATRIUM           Index LA diam:        6.10 cm  3.05 cm/m   RA Area:     25.00 cm LA Vol (A2C):   83.6 ml  41.73 ml/m  RA Volume:   85.70 ml  42.78 ml/m LA Vol (A4C):   118.0 ml 58.91 ml/m LA Biplane Vol: 103.0 ml 51.42 ml/m  AORTIC VALVE AV Area (Vmax):    2.71 cm AV Area (Vmean):   2.62 cm AV Area (VTI):     2.79 cm AV Vmax:           70.60 cm/s AV Vmean:          46.200 cm/s AV VTI:            0.119 m AV Peak Grad:      2.0 mmHg AV Mean Grad:      1.0 mmHg LVOT Vmax:         67.40 cm/s LVOT Vmean:        42.700 cm/s LVOT VTI:          0.117 m LVOT/AV VTI ratio: 0.98 MITRAL VALVE                TRICUSPID VALVE MV Area (PHT): 4.10 cm     TR Peak grad:   49.0 mmHg MV Decel Time: 185 msec     TR Vmax:        350.00 cm/s MV E velocity: 125.00 cm/s MV A velocity: 34.00 cm/s   SHUNTS MV E/A ratio:  3.68         Systemic VTI:  0.12 m                             Systemic Diam: 1.90 cm Dina Rich MD Electronically signed by Dina Rich MD Signature Date/Time: 12/20/2023/3:32:16 PM    Final       Scheduled Meds:  allopurinol  300 mg Oral Daily   azithromycin  500 mg Oral Daily   guaiFENesin  600 mg Oral BID   metoprolol succinate  50 mg Oral Daily   pantoprazole  40 mg Oral QPM   pravastatin  20 mg Oral QPM   senna-docusate  1 tablet Oral BID   Warfarin - Pharmacist  Dosing Inpatient   Does not apply q1600   Continuous Infusions:  ampicillin (OMNIPEN) IV 2 g (12/21/23 1610)   cefTRIAXone (ROCEPHIN)  IV 2 g (12/21/23 1001)   lactated ringers Stopped (12/19/23 0923)     LOS: 2 days   Time spent: 35 minutes   Noralee Stain, DO [facility] 12/21/2023, 1:21 PM   Available via Epic secure chat 7am-7pm After these hours, please refer to coverage provider listed on [email or web address]

## 2023-12-21 NOTE — Progress Notes (Signed)
 PHARMACY - ANTICOAGULATION CONSULT NOTE  Pharmacy Consult for warfarin Indication: atrial fibrillation  Allergies  Allergen Reactions   Imdur [Isosorbide Nitrate] Other (See Comments)    Nausea and lightheaded     Patient Measurements: Height: 5\' 6"  (167.6 cm) Weight: 94.3 kg (207 lb 14.3 oz) IBW/kg (Calculated) : 63.8  Vital Signs: Temp: 97.7 F (36.5 C) (03/01 0731) Temp Source: Oral (03/01 0731) BP: 165/81 (03/01 0731) Pulse Rate: 75 (03/01 0731)  Labs: Recent Labs    12/19/23 0531 12/20/23 0502 12/21/23 0849  HGB 12.3* 12.7* 11.3*  HCT 39.4 40.5 35.8*  PLT 174 114* 143*  LABPROT 28.5* 35.7* 37.6*  INR 2.7* 3.5* 3.8*  CREATININE 1.35* 1.18 1.19    Estimated Creatinine Clearance: 40.8 mL/min (by C-G formula based on SCr of 1.19 mg/dL).   Medical History: Past Medical History:  Diagnosis Date   Acute upper GI bleed 05/18/2016   Aneurysm of thoracic aorta (HCC)    Ascending. 5.7 x 5.5cm - Bentall procedure   Aortic stenosis    Bioprosthetic AVR   Arthritis    Atrial fibrillation (HCC)    Documented on pacer interrogation 6/13   Complete heart block (HCC)    Status post pacemaker placement   Coronary atherosclerosis of native coronary artery    Multivessel - LIMA to LAD, SVG to OM, SVG to PDA   DJD (degenerative joint disease)    Esophageal dysmotility    Essential hypertension    Fibromyalgia    Gout    H/O arteriovenous malformation (AVM)    H/O hiatal hernia    Hemorrhoid    History of esophageal ulcer    History of skin cancer    Hx of adenomatous colonic polyps    Iron deficiency anemia due to chronic blood loss    Melena    Pacemaker-Medtronic    PMR (polymyalgia rheumatica) (HCC)    SIRS (systemic inflammatory response syndrome) (HCC) 01/16/2015    Medications:  Medications Prior to Admission  Medication Sig Dispense Refill Last Dose/Taking   acetaminophen (TYLENOL) 500 MG tablet Take 1,000 mg by mouth every 6 (six) hours as needed for  mild pain.   12/18/2023 Morning   allopurinol (ZYLOPRIM) 300 MG tablet Take 300 mg by mouth daily. Take 1  daily   12/18/2023 Morning   furosemide (LASIX) 20 MG tablet Take 20 mg by mouth every other day.   12/18/2023 Morning   metoprolol succinate (TOPROL-XL) 25 MG 24 hr tablet TAKE (2) TABLETS BY MOUTH DAILY. 180 tablet 1 12/18/2023 Morning   pantoprazole (PROTONIX) 40 MG tablet TAKE ONE TABLET BY MOUTH ONCE DAILY IN THE MORNING. 30 tablet 6 12/18/2023 Morning   pravastatin (PRAVACHOL) 20 MG tablet Take 20 mg by mouth every evening.   11 12/18/2023 Evening   sennosides-docusate sodium (SENOKOT-S) 8.6-50 MG tablet Take 1 tablet by mouth 2 (two) times daily.   12/18/2023 Evening   warfarin (COUMADIN) 5 MG tablet TAKE 1 TABLET BY MOUTH ONCE DAILY OR AS DIRECTED. (Patient taking differently: Take 5 mg by mouth daily. Or as directed) 90 tablet 1 12/18/2023 at  7:00 PM    Assessment: Pharmacy consulted to dose warfarin in patient with atrial fibrillation. PMH includes aortic stenosis s/p AVR, CAD, pacemaker, HTN, hx AVM, hx esophageal ulcers, acute UGIB, hemorrhoids.  Patient's home dose listed as 2.5 mg on Sun and 5 mg ROW (anti-coag visit note) with last dose 2/26. INR on admission is therapeutic at 2.7.   INR 3.8, supratherapeutic. Held warfarin  dose yesterday and will continue to do so today. Hgb 11.3, plt 143 stable. RN reports he is coughing up blood tinged sputum but no other signs of bleeding. Will continue to monitor for now. Also reports he is eating a good portion of his meals.   Notable DDIs: azithromycin was started 2/27, which can increase the anticoagulant effects of warfarin.  Goal of Therapy:  INR 2-3 Monitor platelets by anticoagulation protocol: Yes  Monitoring: Date INR Warfarin Dose 2/27 2.7 5 mg 2/28 3.5 Held 3/1 3.8 Held   Plan:  HOLD warfarin today Daily INR, CBC Monitor for signs/symptoms of bleeding Monitor appetite and for DDIs  Stephenie Acres, PharmD PGY1 Pharmacy  Resident 12/21/2023 9:49 AM

## 2023-12-21 NOTE — Consult Note (Signed)
 Cardiology Consultation   Patient ID: Douglas [name] MRN: [id number]; DOB: [birth year]  Admit date: 12/19/2023 Date of Consult: 12/21/2023  PCP:  Douglas Perches, MD   [facility] Providers Cardiologist:  Douglas Dell, MD  Electrophysiologist:  Douglas Bunting, MD      Patient Profile:   Douglas [name] is a 89 y.o. male with a hx of Bentall procedure/AVR, complete heart block s/p PPM, paroxysmal atrial fibrillation, CAD s/p CABG, HTN who is being seen 12/21/2023 for the evaluation of bacteremia at the request of Douglas [name].  History of Present Illness:   Douglas [name] is a 89 year old male with above medical history who is followed by Douglas [name]. His PPM is followed by Dr. Sharrell [name].   Patient previously underwent CABGx3, Bentall procedure and AVR in 2013. After the procedure, patient developed CHB and had a PPM implanted. Device has been followed by Dr. Ladona [name]. Detected atrial fibrillation, and patient was started on warfarin. Most recent nuclear stress test from 2019 showed findings consistent with a mild-moderate degree of ischemia with EF 16%. Echocardiogram in 09/2018 showed EF 55-60%, mild LVH. Transvalvular velocity was normal through the aortic valve prosthesis.   Patient was seen in 08/2023 for evaluation of syncope. At that time, PPM interrogation showed normal device function and no abnormalities that correlated with his syncopal episode. Echocardiogram on 09/10/23 showed EF 55-60%, no regional wall motion abnormalities, low normal RV function, moderately elevated PA systolic pressure, mild MR. There was a 23 mm Edwards pericardial valve present in the aortic position without AI or AS present. His BP was low, so his amlodipine was discontinued.   Patient presented to the ED at [facility] on 2/27 complaining of cough, chills, shortness of breath, generalized malaise and weakness. COVID, flu, RSV negative. WBC elevated to 14.9. Lactic acid 2.6>2.9>2.3>2.4. CXR showed retrocardiac opacity  with indistinct left hemidiaphragm, findings suspicious for pneumonia vs atelectasis. There was also mild patchy opacities in the right lower lung. Blood cultures detected enterococcus faecalis. Patient was admitted to the hospitalist service for treatment of sepsis due to pneumonia. He was started on IV fluids and broad spectrum antibiotics.   ID was consulted. Recommended EP/cardiology evaluation given his PPM. He was [facility]. Echocardiogram on 12/20/23 was a technically difficult study due to poor echo windows. EF 40%, moderate LVH. There was interventricular septal flattening in systole and diastole, consistent with RV pressure and volume overload. Mitral valve not well visualized, no evidence of MR or MS. Tricuspid regurgitation was mild. There was a pericardial valve in the aortic position without AI or AS.   Douglas [name] reports his dyspnea is improving, but still has a hard time with phlegm. He had a cup of tan sputum which is blood-tinged by the bedside. He denies any chest pain.  Past Medical History:  Diagnosis Date   Acute upper GI bleed 05/18/2016   Aneurysm of thoracic aorta (HCC)    Ascending. 5.7 x 5.5cm - Bentall procedure   Aortic stenosis    Bioprosthetic AVR   Arthritis    Atrial fibrillation (HCC)    Documented on pacer interrogation 6/13   Complete heart block (HCC)    Status post pacemaker placement   Coronary atherosclerosis of native coronary artery    Multivessel - LIMA to LAD, SVG to OM, SVG to PDA   DJD (degenerative joint disease)    Esophageal dysmotility    Essential hypertension    Fibromyalgia    Gout  H/O arteriovenous malformation (AVM)    H/O hiatal hernia    Hemorrhoid    History of esophageal ulcer    History of skin cancer    Hx of adenomatous colonic polyps    Iron deficiency anemia due to chronic blood loss    Melena    Pacemaker-Medtronic    PMR (polymyalgia rheumatica) (HCC)    SIRS (systemic inflammatory response syndrome) (HCC)  01/16/2015    Past Surgical History:  Procedure Laterality Date   AORTIC VALVE REPLACEMENT  01/24/12   APPENDECTOMY     BACK SURGERY     1965 or 1966 lumbar back surgery   BENTALL PROCEDURE  01/24/2012   Procedure: BENTALL PROCEDURE;  Surgeon: Kerin Perna, MD;  Location: [facility] OR;  Service: Open Heart Surgery;  Laterality: N/A;   BILATERAL KNEE ARTHROSCOPY     CATARACT EXTRACTION W/PHACO Left 12/03/2016   Procedure: CATARACT EXTRACTION PHACO AND INTRAOCULAR LENS PLACEMENT LEFT EYE;  Surgeon: Gemma Payor, MD;  Location: [facility] ORS;  Service: Ophthalmology;  Laterality: Left;  CDE:  10.87   CATARACT EXTRACTION W/PHACO Right 12/13/2016   Procedure: CATARACT EXTRACTION PHACO AND INTRAOCULAR LENS PLACEMENT (IOC);  Surgeon: Gemma Payor, MD;  Location: [facility] ORS;  Service: Ophthalmology;  Laterality: Right;  right cde 11.78   CORONARY ARTERY BYPASS GRAFT  01/24/2012   Procedure: CORONARY ARTERY BYPASS GRAFTING (CABG);  Surgeon: Kerin Perna, MD;  Location: [facility] OR;  Service: Open Heart Surgery;  Laterality: N/A;   ESOPHAGOGASTRODUODENOSCOPY  06/17/2012   Procedure: ESOPHAGOGASTRODUODENOSCOPY (EGD);  Surgeon: Malissa Hippo, MD;  Location: [facility] ENDO SUITE;  Service: Endoscopy;  Laterality: N/A;  ED   ESOPHAGOGASTRODUODENOSCOPY N/A 05/19/2016   Procedure: ESOPHAGOGASTRODUODENOSCOPY (EGD);  Surgeon: West Bali, MD;  Location: [facility] END[facility] SUITE;  Service: Endoscopy;  Laterality: N/A;   HEMORROIDECTOMY     HERNIA REPAIR     Left inguinal hernia repair     PACEMAKER PLACEMENT     Medtronic 4/13 - Dr. Johney Frame   PERMANENT PACEMAKER INSERTION N/A 02/01/2012   Procedure: PERMANENT PACEMAKER INSERTION;  Surgeon: Hillis Range, MD; Medtronic Adapta L model ADDRL 1 (serial number [id number])         Inpatient Medications: Scheduled Meds:  allopurinol  300 mg Oral Daily   azithromycin  500 mg Oral Daily   guaiFENesin  600 mg Oral BID   metoprolol succinate  50 mg Oral Daily   pantoprazole  40 mg Oral QPM    pravastatin  20 mg Oral QPM   senna-docusate  1 tablet Oral BID   Warfarin - Pharmacist Dosing Inpatient   Does not apply q1600   Continuous Infusions:  ampicillin (OMNIPEN) IV 2 g (12/21/23 2440)   cefTRIAXone (ROCEPHIN)  IV Stopped (12/20/23 2124)   lactated ringers Stopped (12/19/23 0923)   lactated ringers 50 mL/hr at 12/21/23 0310   PRN Meds: acetaminophen **OR** acetaminophen, bisacodyl, dextromethorphan, fentaNYL (SUBLIMAZE) injection, ipratropium-albuterol, ondansetron **OR** ondansetron (ZOFRAN) IV, oxyCODONE, traZODone  Allergies:    Allergies  Allergen Reactions   Imdur [Isosorbide Nitrate] Other (See Comments)    Nausea and lightheaded     Social History:   Social History   Socioeconomic History   Marital status: Married    Spouse name: Not on file   Number of children: 4   Years of education: Not on file   Highest education level: Not on file  Occupational History   Occupation: Retired    Comment: Engineer, maintenance  Tobacco Use   Smoking  status: Never   Smokeless tobacco: Never  Vaping Use   Vaping status: Never Used  Substance and Sexual Activity   Alcohol use: No    Alcohol/week: 0.0 standard drinks of alcohol   Drug use: No   Sexual activity: Never    Birth control/protection: None  Other Topics Concern   Not on file  Social History Narrative   Lives with wife, has been married since 1954.   Social Drivers of Corporate investment banker Strain: Not on file  Food Insecurity: No Food Insecurity (12/19/2023)   Hunger Vital Sign    Worried About Running Out of Food in the Last Year: Never true    Ran Out of Food in the Last Year: Never true  Transportation Needs: No Transportation Needs (12/19/2023)   PRAPARE - Administrator, Civil Service (Medical): No    Lack of Transportation (Non-Medical): No  Physical Activity: Not on file  Stress: Not on file  Social Connections: Moderately Isolated (12/19/2023)   Social Connection and Isolation  Panel [NHANES]    Frequency of Communication with Friends and Family: Never    Frequency of Social Gatherings with Friends and Family: Once a week    Attends Religious Services: 1 to 4 times per year    Active Member of [employer] or Organizations: No    Attends Banker Meetings: Never    Marital Status: Married  Catering manager Violence: Not At Risk (12/19/2023)   Humiliation, Afraid, Rape, and Kick questionnaire    Fear of Current or Ex-Partner: No    Emotionally Abused: No    Physically Abused: No    Sexually Abused: No    Family History:    Family History  Problem Relation Age of Onset   Kidney disease Mother    Heart disease Father    Anesthesia problems Neg Hx    Hypotension Neg Hx    Malignant hyperthermia Neg Hx    Pseudochol deficiency Neg Hx      ROS:  Pertinent items noted in HPI and remainder of comprehensive ROS otherwise negative.    Physical Exam/Data:   Vitals:   12/20/23 1950 12/20/23 2341 12/21/23 0429 12/21/23 0731  BP: (!) 126/96 (!) 143/75 (!) 141/88 (!) 165/81  Pulse: 72 76 75 75  Resp: (!) 21 20 19 18   Temp: 98.1 F (36.7 C) 98 F (36.7 C) 98.4 F (36.9 C) 97.7 F (36.5 C)  TempSrc: Oral Oral Oral Oral  SpO2: 97% 100% 99% 100%  Weight:   94.3 kg   Height:        Intake/Output Summary (Last 24 hours) at 12/21/2023 0751 Last data filed at 12/21/2023 0310 Gross per 24 hour  Intake 1232.03 ml  Output 400 ml  Net 832.03 ml      12/21/2023    4:29 AM 12/20/2023    5:00 AM 12/19/2023    5:14 AM  Last 3 Weights  Weight (lbs) 207 lb 14.3 oz 200 lb 12.8 oz 195 lb  Weight (kg) 94.3 kg 91.082 kg 88.451 kg     Body mass index is 33.55 kg/m.   General appearance: alert, no distress, and mildly obese Neck: JVD - several cm above sternal notch, no carotid bruit, and thyroid not enlarged, symmetric, no tenderness/mass/nodules Lungs: diminished breath sounds bibasilar and rhonchi bilaterally Heart: regular rate and rhythm and pacer in left  upper chest wall Abdomen: soft, non-tender; bowel sounds normal; no masses,  no organomegaly Extremities: edema 1+ bilateral  LE Pulses: 2+ and symmetric Skin: Skin color, texture, turgor normal. No rashes or lesions Neurologic: Grossly normal Psych: Pleasant  Laboratory Data:  High Sensitivity Troponin:  No results for input(s): "TROPONINIHS" in the last 720 hours.   Chemistry Recent Labs  Lab 12/19/23 0531 12/20/23 0502  NA 135 133*  K 3.8 4.1  CL 105 106  CO2 23 19*  GLUCOSE 131* 103*  BUN 29* 27*  CREATININE 1.35* 1.18  CALCIUM 8.6* 8.6*  MG  --  1.7  GFRNONAA 49* 57*  ANIONGAP 7 8    No results for input(s): "PROT", "ALBUMIN", "AST", "ALT", "ALKPHOS", "BILITOT" in the last 168 hours. Lipids No results for input(s): "CHOL", "TRIG", "HDL", "LABVLDL", "LDLCALC", "CHOLHDL" in the last 168 hours.  Hematology Recent Labs  Lab 12/19/23 0531 12/20/23 0502  WBC 14.9* 16.4*  RBC 4.13* 4.20*  HGB 12.3* 12.7*  HCT 39.4 40.5  MCV 95.4 96.4  MCH 29.8 30.2  MCHC 31.2 31.4  RDW 15.2 15.0  PLT 174 114*   Thyroid No results for input(s): "TSH", "FREET4" in the last 168 hours.  BNPNo results for input(s): "BNP", "PROBNP" in the last 168 hours.  DDimer No results for input(s): "DDIMER" in the last 168 hours.   Radiology/Studies:  ECHOCARDIOGRAM COMPLETE Result Date: 12/20/2023    ECHOCARDIOGRAM REPORT   Patient Name:   Douglas [name] Date of Exam: 12/20/2023 Medical Rec #:  [id number]     Height:       66.0 in Accession #:    [id number]    Weight:       200.8 lb Date of Birth:  1927-12-09     BSA:          2.003 m Patient Age:    89 years      BP:           150/79 mmHg Patient Gender: M             HR:           79 bpm. Exam Location:  Inpatient Procedure: 2D Echo, Cardiac Doppler, Color Doppler and Intracardiac            Opacification Agent (Both Spectral and Color Flow Doppler were            utilized during procedure). Indications:    Bacteremia  History:        Patient has prior  history of Echocardiogram examinations, most                 recent 09/10/2023. Risk Factors:Hypertension.                 Aortic Valve: 23 mm Edwards valve is present in the aortic                 position.  Sonographer:    Karma Ganja Referring Phys: [id number] Cleora Fleet  Sonographer Comments: Technically difficult study due to poor echo windows. IMPRESSIONS  1. Left ventricular ejection fraction, by estimation, is 40%. The left ventricle has mildly decreased function. Left ventricular endocardial border not optimally defined to evaluate regional wall motion. There is moderate left ventricular hypertrophy. Left ventricular diastolic parameters are indeterminate. There is the interventricular septum is flattened in systole and diastole, consistent with right ventricular pressure and volume overload.  2. Right ventricular systolic function was not well visualized. The right ventricular size is not well visualized. There is moderately elevated pulmonary artery systolic pressure.  3. The mitral valve  was not well visualized. No evidence of mitral valve regurgitation. No evidence of mitral stenosis.  4. The tricuspid valve is abnormal.  5. 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 The aortic valve has been repaired/replaced. Aortic valve regurgitation is not visualized. No aortic stenosis is present. There is a 23 mm Edwards valve present in the aortic position.  6. The pulmonic valve was abnormal.  7. The inferior vena cava is normal in size with greater than 50% respiratory variability, suggesting right atrial pressure of 3 mmHg. FINDINGS  Left Ventricle: Left ventricular ejection fraction, by estimation, is 40%. The left ventricle has mildly decreased function. Left ventricular endocardial border not optimally defined to evaluate regional wall motion. Definity contrast agent was given IV  to delineate the left ventricular  endocardial borders. Strain imaging was not performed. The left ventricular internal cavity size was normal in size. There is moderate left ventricular hypertrophy. The interventricular septum is flattened in systole and diastole, consistent with right ventricular pressure and volume overload. Left ventricular diastolic parameters are indeterminate. Right Ventricle: The right ventricular size is not well visualized. Right vetricular wall thickness was not well visualized. Right ventricular systolic function was not well visualized. There is moderately elevated pulmonary artery systolic pressure. The  tricuspid regurgitant velocity is 3.50 m/s, and with an assumed right atrial pressure of 3 mmHg, the estimated right ventricular systolic pressure is 52.0 mmHg. Left Atrium: Left atrial size was not well visualized. Right Atrium: Right atrial size was not well visualized. Pericardium: There is no evidence of pericardial effusion. Mitral Valve: The mitral valve was not well visualized. No evidence of mitral valve regurgitation. No evidence of mitral valve stenosis. Tricuspid Valve: The tricuspid valve is abnormal. Tricuspid valve regurgitation is mild . No evidence of tricuspid stenosis. 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 The aortic valve has been repaired/replaced. Aortic valve regurgitation is not visualized. No aortic stenosis is present. Aortic valve mean gradient measures 1.0 mmHg. Aortic valve peak gradient measures 2.0 mmHg. Aortic valve area, by VTI measures 2.79 cm. There is a 23 mm Edwards valve present in the aortic position. Pulmonic Valve: The pulmonic valve was abnormal. Pulmonic valve regurgitation is mild. No evidence of pulmonic stenosis. Aorta: The aortic root was not well visualized. Venous: The inferior vena cava is normal in size with greater than 50% respiratory variability, suggesting right  atrial pressure of 3 mmHg. IAS/Shunts: The interatrial septum was not well visualized. Additional Comments: 3D imaging was not performed. A device lead is visualized in the right atrium and right ventricle.  LEFT VENTRICLE PLAX 2D LVIDd:         4.70 cm      Diastology LVIDs:         3.70 cm      LV e' medial:    8.27 cm/s LV PW:         1.30 cm      LV E/e' medial:  15.1 LV IVS:        1.30 cm      LV e' lateral:   7.83 cm/s LVOT diam:     1.90 cm      LV E/e' lateral: 16.0 LV SV:         33 LV SV Index:   17 LVOT Area:     2.84  cm  LV Volumes (MOD) LV vol d, MOD A2C: 87.5 ml LV vol d, MOD A4C: 114.0 ml LV vol s, MOD A2C: 44.2 ml LV vol s, MOD A4C: 67.6 ml LV SV MOD A2C:     43.3 ml LV SV MOD A4C:     114.0 ml LV SV MOD BP:      46.3 ml RIGHT VENTRICLE            IVC RV Basal diam:  4.50 cm    IVC diam: 1.60 cm RV S prime:     5.87 cm/s TAPSE (M-mode): 1.6 cm LEFT ATRIUM              Index        RIGHT ATRIUM           Index LA diam:        6.10 cm  3.05 cm/m   RA Area:     25.00 cm LA Vol (A2C):   83.6 ml  41.73 ml/m  RA Volume:   85.70 ml  42.78 ml/m LA Vol (A4C):   118.0 ml 58.91 ml/m LA Biplane Vol: 103.0 ml 51.42 ml/m  AORTIC VALVE AV Area (Vmax):    2.71 cm AV Area (Vmean):   2.62 cm AV Area (VTI):     2.79 cm AV Vmax:           70.60 cm/s AV Vmean:          46.200 cm/s AV VTI:            0.119 m AV Peak Grad:      2.0 mmHg AV Mean Grad:      1.0 mmHg LVOT Vmax:         67.40 cm/s LVOT Vmean:        42.700 cm/s LVOT VTI:          0.117 m LVOT/AV VTI ratio: 0.98 MITRAL VALVE                TRICUSPID VALVE MV Area (PHT): 4.10 cm     TR Peak grad:   49.0 mmHg MV Decel Time: 185 msec     TR Vmax:        350.00 cm/s MV E velocity: 125.00 cm/s MV A velocity: 34.00 cm/s   SHUNTS MV E/A ratio:  3.68         Systemic VTI:  0.12 m                             Systemic Diam: 1.90 cm Dina Rich MD Electronically signed by Dina Rich MD Signature Date/Time: 12/20/2023/3:32:16 PM    Final    DG CHEST  PORT 1 VIEW Result Date: 12/19/2023 CLINICAL DATA:  Cough. EXAM: PORTABLE CHEST 1 VIEW COMPARISON:  Chest radiograph dated 12/19/2023 at 5:34 a.m. FINDINGS: Patient is rotated to the left. Stable mild cardiomegaly. Prior median sternotomy and aortic valve repair. Stable left chest wall pacemaker in place. Similar retrocardiac opacity and mild patchy right basilar opacities. No pneumothorax identified. No acute osseous abnormality. IMPRESSION: 1. Similar retrocardiac and mild patchy right basilar opacities could reflect atelectasis or infiltrate. 2. Similar blunting of the left costophrenic angle could reflect scarring or a small effusion. Electronically Signed   By: Hart Robinsons M.D.   On: 12/19/2023 11:49   DG Chest Portable 1 View Result Date: 12/19/2023 CLINICAL DATA:  Persistent cough EXAM: PORTABLE CHEST 1 VIEW COMPARISON:  08/29/2023 FINDINGS: Unchanged left chest wall pacer. Previous median sternotomy and aortic valve repair. Aortic atherosclerotic calcifications. Stable cardiomediastinal contours. Retrocardiac opacity with indistinct left hemidiaphragm is identified. Blunting of the left costophrenic angle. Mild patchy opacities identified in the right lower lung. Visualized osseous structures are unremarkable. IMPRESSION: 1. Retrocardiac opacity with indistinct left hemidiaphragm. Findings may reflect atelectasis and/or pneumonia. 2. Mild patchy opacities in the right lower lung. 3. Blunting of left costophrenic angle which may reflect chronic pleuroparenchymal scarring versus small effusion. Electronically Signed   By: Signa Kell M.D.   On: 12/19/2023 06:12     Assessment and Plan:   CHB s/p PPM  Sepsis  - Patient previously had PPM implanted in 2013 for CHB. Device followed by Dr. Ladona [name]  - Most recent device check from 1/29 showed normal device function  - Patient now admitted with sepsis secondary to pneumonia. Blood cultures detected E. Faecalis  - Echocardiogram without obvious  vegetation, but was a poor quality study  - Will try to schedule TEE early next week - if there is endocarditis or signs of lead infection, will need PPM extraction and cardiac EP evaluation.  S/p bentall procedure and AVR   -Will further evaluate with TEE next week  PAF  - Has been maintained on warfarin - now per pharmacy - INR 3.5 - Paced rhythm in the 70's  Acute systolic congestive heart failure - Echo showed reduced LVEF to 40% with elevated filling pressures and signs of RV volume/pressure overload. Prior echo in 08/2023 showed normal LVEF. - Will add BNP to labs, neck veins elevated, 1+ LE edema - Stop IV fluids - Start diuresis with lasix 40 mg IV x 1 today- monitor creatinine - BMET pending today  HTN - BP elevated, monitor with diuresis. Based on renal function may be able to add ARB.  Thanks for the consultation - cardiology will follow with you.  For questions or updates, please contact [facility] Please consult [email or web address] for contact info under   Chrystie Nose, MD, Milagros Loll  [place]  [facility]  Medical Director of the [facility]  Cardiovascular Risk Reduction [facility] Diplomate of the American Board of Clinical Lipidology Attending Cardiologist  Direct Dial: [phone]  Fax: [phone]  Website:  [email or web address]

## 2023-12-22 DIAGNOSIS — I5021 Acute systolic (congestive) heart failure: Secondary | ICD-10-CM | POA: Diagnosis not present

## 2023-12-22 DIAGNOSIS — B952 Enterococcus as the cause of diseases classified elsewhere: Secondary | ICD-10-CM | POA: Diagnosis not present

## 2023-12-22 DIAGNOSIS — R531 Weakness: Secondary | ICD-10-CM

## 2023-12-22 DIAGNOSIS — Z952 Presence of prosthetic heart valve: Secondary | ICD-10-CM | POA: Diagnosis not present

## 2023-12-22 DIAGNOSIS — Z950 Presence of cardiac pacemaker: Secondary | ICD-10-CM | POA: Diagnosis not present

## 2023-12-22 DIAGNOSIS — R7881 Bacteremia: Secondary | ICD-10-CM | POA: Diagnosis not present

## 2023-12-22 DIAGNOSIS — J189 Pneumonia, unspecified organism: Secondary | ICD-10-CM | POA: Diagnosis not present

## 2023-12-22 DIAGNOSIS — N179 Acute kidney failure, unspecified: Secondary | ICD-10-CM | POA: Diagnosis not present

## 2023-12-22 LAB — LEGIONELLA PNEUMOPHILA SEROGP 1 UR AG: L. pneumophila Serogp 1 Ur Ag: NEGATIVE

## 2023-12-22 LAB — CULTURE, BLOOD (ROUTINE X 2): Special Requests: ADEQUATE

## 2023-12-22 LAB — BASIC METABOLIC PANEL
Anion gap: 14 (ref 5–15)
BUN: 24 mg/dL — ABNORMAL HIGH (ref 8–23)
CO2: 22 mmol/L (ref 22–32)
Calcium: 8.6 mg/dL — ABNORMAL LOW (ref 8.9–10.3)
Chloride: 100 mmol/L (ref 98–111)
Creatinine, Ser: 1.14 mg/dL (ref 0.61–1.24)
GFR, Estimated: 60 mL/min — ABNORMAL LOW (ref >60)
Glucose, Bld: 117 mg/dL — ABNORMAL HIGH (ref 70–99)
Potassium: 4.2 mmol/L (ref 3.5–5.1)
Sodium: 136 mmol/L (ref 135–145)

## 2023-12-22 LAB — CBC
HCT: 35 % — ABNORMAL LOW (ref 39.0–52.0)
Hemoglobin: 11.2 g/dL — ABNORMAL LOW (ref 13.0–17.0)
MCH: 30 pg (ref 26.0–34.0)
MCHC: 32 g/dL (ref 30.0–36.0)
MCV: 93.8 fL (ref 80.0–100.0)
Platelets: 123 10*3/uL — ABNORMAL LOW (ref 150–400)
RBC: 3.73 MIL/uL — ABNORMAL LOW (ref 4.22–5.81)
RDW: 15 % (ref 11.5–15.5)
WBC: 10.4 10*3/uL (ref 4.0–10.5)
nRBC: 0 % (ref 0.0–0.2)

## 2023-12-22 LAB — PROTIME-INR
INR: 3.8 — ABNORMAL HIGH (ref 0.8–1.2)
Prothrombin Time: 37.3 s — ABNORMAL HIGH (ref 11.4–15.2)

## 2023-12-22 MED ORDER — FUROSEMIDE 10 MG/ML IJ SOLN
40.0000 mg | Freq: Once | INTRAMUSCULAR | Status: AC
  Administered 2023-12-22: 40 mg via INTRAVENOUS
  Filled 2023-12-22: qty 4

## 2023-12-22 NOTE — Progress Notes (Signed)
 DAILY PROGRESS NOTE   Patient Name: Douglas [name] Date of Encounter: 12/22/2023 Cardiologist: Nona Dell, MD  Chief Complaint   Feels better today  Patient Profile   Douglas [name] is a 89 y.o. male with a hx of Bentall procedure/AVR, complete heart block s/p PPM, paroxysmal atrial fibrillation, CAD s/p CABG, HTN who is being seen 12/21/2023 for the evaluation of bacteremia at the request of Dr. Alvino Chapel.   Subjective   Only complaint is right elbow pain. Mildly net negative with lasix, but he said he peed a lot. BNP 820. Creatinine lower at 1.14. Leukocytosis is improving. Weights probably innacurate, claiming 6 KG weight gain in 3 days.   Objective   Vitals:   12/21/23 1958 12/21/23 2305 12/22/23 0545 12/22/23 0712  BP: (!) 161/78 (!) 141/74 (!) 150/81 (!) 157/96  Pulse: 70 69 70 76  Resp: 18 17 18 18   Temp: 99.2 F (37.3 C) 98.5 F (36.9 C) 98.1 F (36.7 C) 98 F (36.7 C)  TempSrc: Oral Oral Oral Oral  SpO2: 96% 92% 96% 95%  Weight:      Height:        Intake/Output Summary (Last 24 hours) at 12/22/2023 4098 Last data filed at 12/22/2023 1191 Gross per 24 hour  Intake 1040 ml  Output 1050 ml  Net -10 ml   Filed Weights   12/19/23 0514 12/20/23 0500 12/21/23 0429  Weight: 88.5 kg 91.1 kg 94.3 kg    Physical Exam   General appearance: alert and no distress Neck: JVD - 3 cm above sternal notch, no carotid bruit, and thyroid not enlarged, symmetric, no tenderness/mass/nodules Lungs: diminished breath sounds bibasilar Heart: regular rate and rhythm Extremities: edema trace to 1+ LE Skin: Skin color, texture, turgor normal. No rashes or lesions Psych :Pleasant  Inpatient Medications    Scheduled Meds:  allopurinol  300 mg Oral Daily   azithromycin  500 mg Oral Daily   diclofenac Sodium  2 g Topical QID   guaiFENesin  600 mg Oral BID   metoprolol succinate  50 mg Oral Daily   pantoprazole  40 mg Oral QPM   pravastatin  20 mg Oral QPM   senna-docusate  1  tablet Oral BID   Warfarin - Pharmacist Dosing Inpatient   Does not apply q1600    Continuous Infusions:  ampicillin (OMNIPEN) IV 2 g (12/22/23 0531)   cefTRIAXone (ROCEPHIN)  IV 2 g (12/21/23 2155)    PRN Meds: acetaminophen **OR** acetaminophen, bisacodyl, dextromethorphan, fentaNYL (SUBLIMAZE) injection, ipratropium-albuterol, ondansetron **OR** ondansetron (ZOFRAN) IV, oxyCODONE, traZODone   Labs   Results for orders placed or performed during the hospital encounter of 12/19/23 (from the past 48 hours)  Procalcitonin     Status: None   Collection Time: 12/21/23  2:23 AM  Result Value Ref Range   Procalcitonin 4.94 ng/mL    Comment:        Interpretation: PCT > 2 ng/mL: Systemic infection (sepsis) is likely, unless other causes are known. (NOTE)       Sepsis PCT Algorithm           Lower Respiratory Tract                                      Infection PCT Algorithm    ----------------------------     ----------------------------         PCT < 0.25 ng/mL  PCT < 0.10 ng/mL          Strongly encourage             Strongly discourage   discontinuation of antibiotics    initiation of antibiotics    ----------------------------     -----------------------------       PCT 0.25 - 0.50 ng/mL            PCT 0.10 - 0.25 ng/mL               OR       >80% decrease in PCT            Discourage initiation of                                            antibiotics      Encourage discontinuation           of antibiotics    ----------------------------     -----------------------------         PCT >= 0.50 ng/mL              PCT 0.26 - 0.50 ng/mL               AND       <80% decrease in PCT              Encourage initiation of                                             antibiotics       Encourage continuation           of antibiotics    ----------------------------     -----------------------------        PCT >= 0.50 ng/mL                  PCT > 0.50 ng/mL                AND         increase in PCT                  Strongly encourage                                      initiation of antibiotics    Strongly encourage escalation           of antibiotics                                     -----------------------------                                           PCT <= 0.25 ng/mL                                                 OR                                        >  80% decrease in PCT                                      Discontinue / Do not initiate                                             antibiotics  Performed at [facility], [address], [place], [place] [address]   Basic metabolic panel     Status: Abnormal   Collection Time: 12/21/23  8:49 AM  Result Value Ref Range   Sodium 135 135 - 145 mmol/L   Potassium 3.9 3.5 - 5.1 mmol/L   Chloride 102 98 - 111 mmol/L   CO2 24 22 - 32 mmol/L   Glucose, Bld 150 (H) 70 - 99 mg/dL    Comment: Glucose reference range applies only to samples taken after fasting for at least 8 hours.   BUN 24 (H) 8 - 23 mg/dL   Creatinine, Ser 6.04 0.61 - 1.24 mg/dL   Calcium 8.7 (L) 8.9 - 10.3 mg/dL   GFR, Estimated 57 (L) >60 mL/min    Comment: (NOTE) Calculated using the CKD-EPI Creatinine Equation (2021)    Anion gap 9 5 - 15    Comment: Performed at [facility], [address] [place], [place] [address]  CBC with Differential/Platelet     Status: Abnormal   Collection Time: 12/21/23  8:49 AM  Result Value Ref Range   WBC 11.8 (H) 4.0 - 10.5 K/uL   RBC 3.81 (L) 4.22 - 5.81 MIL/uL   Hemoglobin 11.3 (L) 13.0 - 17.0 g/dL   HCT 11.9 (L) 14.7 - 82.9 %   MCV 94.0 80.0 - 100.0 fL   MCH 29.7 26.0 - 34.0 pg   MCHC 31.6 30.0 - 36.0 g/dL   RDW 56.2 13.0 - 86.5 %   Platelets 143 (L) 150 - 400 K/uL   nRBC 0.0 0.0 - 0.2 %   Neutrophils Relative % 80 %   Neutro Abs 9.6 (H) 1.7 - 7.7 K/uL   Lymphocytes Relative 8 %   Lymphs Abs 1.0 0.7 - 4.0 K/uL   Monocytes Relative 8 %   Monocytes Absolute 0.9  0.1 - 1.0 K/uL   Eosinophils Relative 2 %   Eosinophils Absolute 0.2 0.0 - 0.5 K/uL   Basophils Relative 1 %   Basophils Absolute 0.1 0.0 - 0.1 K/uL   Immature Granulocytes 1 %   Abs Immature Granulocytes 0.06 0.00 - 0.07 K/uL    Comment: Performed at [facility], [address], [place], [place] [address]  Protime-INR     Status: Abnormal   Collection Time: 12/21/23  8:49 AM  Result Value Ref Range   Prothrombin Time 37.6 (H) 11.4 - 15.2 seconds   INR 3.8 (H) 0.8 - 1.2    Comment: (NOTE) INR goal varies based on device and disease states. Performed at [facility], [address] [place], [place] [address]   Brain natriuretic peptide     Status: Abnormal   Collection Time: 12/21/23  8:49 AM  Result Value Ref Range   B Natriuretic Peptide 820.2 (H) 0.0 - 100.0 pg/mL    Comment: Performed at [facility], [address], [place], [place] [address]  Basic metabolic panel     Status: Abnormal   Collection Time: 12/22/23  2:27 AM  Result Value Ref Range   Sodium 136 135 - 145 mmol/L   Potassium 4.2 3.5 - 5.1 mmol/L   Chloride 100 98 - 111 mmol/L   CO2 22 22 - 32 mmol/L   Glucose, Bld 117 (H) 70 - 99 mg/dL    Comment: Glucose reference range applies only to samples taken after fasting for at least 8 hours.   BUN 24 (H) 8 - 23 mg/dL   Creatinine, Ser 8.46 0.61 - 1.24 mg/dL   Calcium 8.6 (L) 8.9 - 10.3 mg/dL   GFR, Estimated 60 (L) >60 mL/min    Comment: (NOTE) Calculated using the CKD-EPI Creatinine Equation (2021)    Anion gap 14 5 - 15    Comment: Performed at [facility], [address] [place], [place] [address]  Protime-INR     Status: Abnormal   Collection Time: 12/22/23  2:27 AM  Result Value Ref Range   Prothrombin Time 37.3 (H) 11.4 - 15.2 seconds   INR 3.8 (H) 0.8 - 1.2    Comment: (NOTE) INR goal varies based on device and disease states. Performed at [facility], [address] [place], [place] [address]   CBC     Status:  Abnormal   Collection Time: 12/22/23  2:27 AM  Result Value Ref Range   WBC 10.4 4.0 - 10.5 K/uL   RBC 3.73 (L) 4.22 - 5.81 MIL/uL   Hemoglobin 11.2 (L) 13.0 - 17.0 g/dL   HCT 24.4 (L) 01.0 - 27.2 %   MCV 93.8 80.0 - 100.0 fL   MCH 30.0 26.0 - 34.0 pg   MCHC 32.0 30.0 - 36.0 g/dL   RDW 53.6 64.4 - 03.4 %   Platelets 123 (L) 150 - 400 K/uL    Comment: REPEATED TO VERIFY   nRBC 0.0 0.0 - 0.2 %    Comment: Performed at [facility], [address] [place], [place] [address]    ECG   N/A  Telemetry   Paced rhythm - Personally Reviewed  Radiology    ECHOCARDIOGRAM COMPLETE Result Date: 12/20/2023    ECHOCARDIOGRAM REPORT   Patient Name:   Douglas [name] Date of Exam: 12/20/2023 Medical Rec #:  [id number]     Height:       66.0 in Accession #:    [id number]    Weight:       200.8 lb Date of Birth:  12-May-1928     BSA:          2.003 m Patient Age:    89 years      BP:           150/79 mmHg Patient Gender: M             HR:           79 bpm. Exam Location:  Inpatient Procedure: 2D Echo, Cardiac Doppler, Color Doppler and Intracardiac            Opacification Agent (Both Spectral and Color Flow Doppler were            utilized during procedure). Indications:    Bacteremia  History:        Patient has prior history of Echocardiogram examinations, most                 recent 09/10/2023. Risk Factors:Hypertension.  Aortic Valve: 23 mm Edwards valve is present in the aortic                 position.  Sonographer:    Karma Ganja Referring Phys: [id number] Cleora Fleet  Sonographer Comments: Technically difficult study due to poor echo windows. IMPRESSIONS  1. Left ventricular ejection fraction, by estimation, is 40%. The left ventricle has mildly decreased function. Left ventricular endocardial border not optimally defined to evaluate regional wall motion. There is moderate left ventricular hypertrophy. Left ventricular diastolic parameters are indeterminate. There is the  interventricular septum is flattened in systole and diastole, consistent with right ventricular pressure and volume overload.  2. Right ventricular systolic function was not well visualized. The right ventricular size is not well visualized. There is moderately elevated pulmonary artery systolic pressure.  3. The mitral valve was not well visualized. No evidence of mitral valve regurgitation. No evidence of mitral stenosis.  4. The tricuspid valve is abnormal.  5. 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 The aortic valve has been repaired/replaced. Aortic valve regurgitation is not visualized. No aortic stenosis is present. There is a 23 mm Edwards valve present in the aortic position.  6. The pulmonic valve was abnormal.  7. The inferior vena cava is normal in size with greater than 50% respiratory variability, suggesting right atrial pressure of 3 mmHg. FINDINGS  Left Ventricle: Left ventricular ejection fraction, by estimation, is 40%. The left ventricle has mildly decreased function. Left ventricular endocardial border not optimally defined to evaluate regional wall motion. Definity contrast agent was given IV  to delineate the left ventricular endocardial borders. Strain imaging was not performed. The left ventricular internal cavity size was normal in size. There is moderate left ventricular hypertrophy. The interventricular septum is flattened in systole and diastole, consistent with right ventricular pressure and volume overload. Left ventricular diastolic parameters are indeterminate. Right Ventricle: The right ventricular size is not well visualized. Right vetricular wall thickness was not well visualized. Right ventricular systolic function was not well visualized. There is moderately elevated pulmonary artery systolic pressure. The  tricuspid regurgitant velocity is 3.50 m/s, and with an assumed right atrial pressure  of 3 mmHg, the estimated right ventricular systolic pressure is 52.0 mmHg. Left Atrium: Left atrial size was not well visualized. Right Atrium: Right atrial size was not well visualized. Pericardium: There is no evidence of pericardial effusion. Mitral Valve: The mitral valve was not well visualized. No evidence of mitral valve regurgitation. No evidence of mitral valve stenosis. Tricuspid Valve: The tricuspid valve is abnormal. Tricuspid valve regurgitation is mild . No evidence of tricuspid stenosis. 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 The aortic valve has been repaired/replaced. Aortic valve regurgitation is not visualized. No aortic stenosis is present. Aortic valve mean gradient measures 1.0 mmHg. Aortic valve peak gradient measures 2.0 mmHg. Aortic valve area, by VTI measures 2.79 cm. There is a 23 mm Edwards valve present in the aortic position. Pulmonic Valve: The pulmonic valve was abnormal. Pulmonic valve regurgitation is mild. No evidence of pulmonic stenosis. Aorta: The aortic root was not well visualized. Venous: The inferior vena cava is normal in size with greater than 50% respiratory variability, suggesting right atrial pressure of 3 mmHg. IAS/Shunts: The interatrial septum was not well visualized. Additional Comments: 3D imaging was not performed.  A device lead is visualized in the right atrium and right ventricle.  LEFT VENTRICLE PLAX 2D LVIDd:         4.70 cm      Diastology LVIDs:         3.70 cm      LV e' medial:    8.27 cm/s LV PW:         1.30 cm      LV E/e' medial:  15.1 LV IVS:        1.30 cm      LV e' lateral:   7.83 cm/s LVOT diam:     1.90 cm      LV E/e' lateral: 16.0 LV SV:         33 LV SV Index:   17 LVOT Area:     2.84 cm  LV Volumes (MOD) LV vol d, MOD A2C: 87.5 ml LV vol d, MOD A4C: 114.0 ml LV vol s, MOD A2C: 44.2 ml LV vol s, MOD A4C: 67.6 ml LV SV MOD A2C:     43.3 ml LV  SV MOD A4C:     114.0 ml LV SV MOD BP:      46.3 ml RIGHT VENTRICLE            IVC RV Basal diam:  4.50 cm    IVC diam: 1.60 cm RV S prime:     5.87 cm/s TAPSE (M-mode): 1.6 cm LEFT ATRIUM              Index        RIGHT ATRIUM           Index LA diam:        6.10 cm  3.05 cm/m   RA Area:     25.00 cm LA Vol (A2C):   83.6 ml  41.73 ml/m  RA Volume:   85.70 ml  42.78 ml/m LA Vol (A4C):   118.0 ml 58.91 ml/m LA Biplane Vol: 103.0 ml 51.42 ml/m  AORTIC VALVE AV Area (Vmax):    2.71 cm AV Area (Vmean):   2.62 cm AV Area (VTI):     2.79 cm AV Vmax:           70.60 cm/s AV Vmean:          46.200 cm/s AV VTI:            0.119 m AV Peak Grad:      2.0 mmHg AV Mean Grad:      1.0 mmHg LVOT Vmax:         67.40 cm/s LVOT Vmean:        42.700 cm/s LVOT VTI:          0.117 m LVOT/AV VTI ratio: 0.98 MITRAL VALVE                TRICUSPID VALVE MV Area (PHT): 4.10 cm     TR Peak grad:   49.0 mmHg MV Decel Time: 185 msec     TR Vmax:        350.00 cm/s MV E velocity: 125.00 cm/s MV A velocity: 34.00 cm/s   SHUNTS MV E/A ratio:  3.68         Systemic VTI:  0.12 m                             Systemic Diam: 1.90 cm Dina Rich MD Electronically signed by Dina Rich  MD Signature Date/Time: 12/20/2023/3:32:16 PM    Final     Cardiac Studies   See echo above  Assessment   Principal Problem:   CAP (community acquired pneumonia) Active Problems:   Essential hypertension, benign   Coronary atherosclerosis of native coronary artery   Atrial fibrillation (HCC)   Pacemaker-Medtronic   Aortic valve replaced   Chronic anticoagulation - Coumadin, CHADS2VASC=5   History of esophageal ulcer   H/O arteriovenous malformation (AVM)   Oropharyngeal dysphagia   AKI (acute kidney injury) (HCC)   Leukocytosis   Lactic acidosis   Generalized weakness   Hyperglycemia   Bacteremia   Acute systolic (congestive) heart failure (HCC)   Pressure injury of skin   Plan   BNP in the 800's - not much recorded negative  with lasix yesterday. Will re-dose lasix today. C/o right elbow pain - the left arm appears to be diffusely ecchymotic. Will defer to primary service. I have messaged our team to work on scheduling TEE for next week. Keep NPO p MN except sips with meds in case it could be scheduled tomorrow.  Time Spent Directly with Patient:  I have spent a total of 25 minutes with the patient reviewing hospital notes, telemetry, EKGs, labs and examining the patient as well as establishing an assessment and plan that was discussed personally with the patient.  > 50% of time was spent in direct patient care.  Length of Stay:  LOS: 3 days   Chrystie Nose, MD, [facility], FACP  [place]  [facility]  Medical Director of the [facility] Lip[facility]  Cardiovascular Risk Reduction Clinic Diplomate of the American Board of Clinical Lipidology Attending Cardiologist  Direct Dial: [phone]  Fax: [phone]  Website:  [email or web address] 12/22/2023, 8:37 AM

## 2023-12-22 NOTE — Progress Notes (Signed)
 PHARMACY - ANTICOAGULATION CONSULT NOTE  Pharmacy Consult for warfarin Indication: atrial fibrillation  Allergies  Allergen Reactions   Imdur [Isosorbide Nitrate] Other (See Comments)    Nausea and lightheaded     Patient Measurements: Height: 5\' 6"  (167.6 cm) Weight: 94.3 kg (207 lb 14.3 oz) IBW/kg (Calculated) : 63.8  Vital Signs: Temp: 98 F (36.7 C) (03/02 0712) Temp Source: Oral (03/02 0712) BP: 157/96 (03/02 0712) Pulse Rate: 76 (03/02 0712)  Labs: Recent Labs    12/20/23 0502 12/21/23 0849 12/22/23 0227  HGB 12.7* 11.3* 11.2*  HCT 40.5 35.8* 35.0*  PLT 114* 143* 123*  LABPROT 35.7* 37.6* 37.3*  INR 3.5* 3.8* 3.8*  CREATININE 1.18 1.19 1.14    Estimated Creatinine Clearance: 42.6 mL/min (by C-G formula based on SCr of 1.14 mg/dL).   Medical History: Past Medical History:  Diagnosis Date   Acute upper GI bleed 05/18/2016   Aneurysm of thoracic aorta (HCC)    Ascending. 5.7 x 5.5cm - Bentall procedure   Aortic stenosis    Bioprosthetic AVR   Arthritis    Atrial fibrillation (HCC)    Documented on pacer interrogation 6/13   Complete heart block (HCC)    Status post pacemaker placement   Coronary atherosclerosis of native coronary artery    Multivessel - LIMA to LAD, SVG to OM, SVG to PDA   DJD (degenerative joint disease)    Esophageal dysmotility    Essential hypertension    Fibromyalgia    Gout    H/O arteriovenous malformation (AVM)    H/O hiatal hernia    Hemorrhoid    History of esophageal ulcer    History of skin cancer    Hx of adenomatous colonic polyps    Iron deficiency anemia due to chronic blood loss    Melena    Pacemaker-Medtronic    PMR (polymyalgia rheumatica) (HCC)    SIRS (systemic inflammatory response syndrome) (HCC) 01/16/2015    Medications:  Medications Prior to Admission  Medication Sig Dispense Refill Last Dose/Taking   acetaminophen (TYLENOL) 500 MG tablet Take 1,000 mg by mouth every 6 (six) hours as needed for  mild pain.   12/18/2023 Morning   allopurinol (ZYLOPRIM) 300 MG tablet Take 300 mg by mouth daily. Take 1  daily   12/18/2023 Morning   furosemide (LASIX) 20 MG tablet Take 20 mg by mouth every other day.   12/18/2023 Morning   metoprolol succinate (TOPROL-XL) 25 MG 24 hr tablet TAKE (2) TABLETS BY MOUTH DAILY. 180 tablet 1 12/18/2023 Morning   pantoprazole (PROTONIX) 40 MG tablet TAKE ONE TABLET BY MOUTH ONCE DAILY IN THE MORNING. 30 tablet 6 12/18/2023 Morning   pravastatin (PRAVACHOL) 20 MG tablet Take 20 mg by mouth every evening.   11 12/18/2023 Evening   sennosides-docusate sodium (SENOKOT-S) 8.6-50 MG tablet Take 1 tablet by mouth 2 (two) times daily.   12/18/2023 Evening   warfarin (COUMADIN) 5 MG tablet TAKE 1 TABLET BY MOUTH ONCE DAILY OR AS DIRECTED. (Patient taking differently: Take 5 mg by mouth daily. Or as directed) 90 tablet 1 12/18/2023 at  7:00 PM    Assessment: Pharmacy consulted to dose warfarin in patient with atrial fibrillation. PMH includes aortic stenosis s/p AVR, CAD, pacemaker, HTN, hx AVM, hx esophageal ulcers, acute UGIB, hemorrhoids.  Patient's home dose listed as 2.5 mg on Sun and 5 mg ROW (anti-coag visit note) with last dose 2/26. INR on admission is therapeutic at 2.7.   INR 3.8, supratherapeutic. Held warfarin  last 2 days and will continue to do so today. Hgb 11.2, plt 123, low stable. RN reports no signs of bleeding and he is eating 100% of his meals.   Notable DDIs: azithromycin was started 2/27, which can increase the anticoagulant effects of warfarin.  Goal of Therapy:  INR 2-3 Monitor platelets by anticoagulation protocol: Yes  Monitoring: Date INR Warfarin Dose 2/27 2.7 5 mg 2/28 3.5 Held 3/1 3.8 Held 3/2 3.8 Held   Plan:  HOLD warfarin today Daily INR, CBC Monitor for signs/symptoms of bleeding Monitor appetite and for DDIs  Stephenie Acres, PharmD PGY1 Pharmacy Resident 12/22/2023 7:51 AM

## 2023-12-22 NOTE — Progress Notes (Signed)
 [facility]                                                                              Christofer Shen, is a 89 y.o. male, DOB - Oct 01, 1928, NFA:[id number] Admit date - 12/19/2023    Outpatient Primary MD for the patient is Carylon Perches, MD  LOS - 3  days  Chief Complaint  Patient presents with   Cough   Chills   Shortness of Breath       Brief summary   Patient is a 89 year old male with Afib on warfarin, s/p AVR, CAD, HTN, h/o AVM, h/o esophageal ulcer, h/o CHB with pacemaker in place, GERD who presented to [facility] ED by home complaining of cough, chills and SOB that started yesterday.  No chest pain, reported overall malaise and weakness.  No known sick contacts, reported that he is fairly active at his age and was working outside on a tractor a day before the admission.  He had flulike symptoms, headache, sore throat.    On arrival he was treated for sepsis due to pneumonia with leukocytosis and abnormal chest xray.  He had abnormal lung sounds and admission was requested for further management.  Workup revealed sepsis secondary to Enterobacter faecalis bacteremia.  Infectious disease was consulted, transferred to [facility] for cardiology/EP consultation.    Assessment & Plan    Principal Problem: Sepsis secondary to CAP (community acquired pneumonia), bacteremia, POA Acute respiratory failure with hypoxia -Patient met sepsis criteria on admission with tachypnea, hypoxia, borderline BP, leukocytosis, lactic acid, chest x-ray with pneumonia. -Continue Zithromax, Rocephin -In ED, was placed on 3 L O2 via , weaned O2, currently on room air  Active Problems: Sepsis secondary to Enterobacter faecalis bacteremia -ID consulted, continue ampicillin -Cardiology consulted, planning for TEE -N.p.o. after midnight  Acute systolic congestive heart failure -BNP 820.2 -2D echo showed EF of 40%, indeterminate diastolic parameters, right ventricular pressure  and volume overload -Cardiology following, on IV Lasix for diuresis  Acute kidney injury -Creatinine 1.35 on admission, baseline 1.2 on 08/29/2023 -Resolved  History of complete heart block Status post pacemaker 2013  Paroxysmal atrial fibrillation, acquired thrombophilia -Continue metoprolol -Continue warfarin per pharmacy   Hyperlipidemia Continue pravastatin  Gout Continue allopurinol  GERD Continue PPI  Pressure Injury documentation Pressure Injury 12/20/23 Perineum Bilateral Stage 1 -  Intact skin with non-blanchable redness of a localized area usually over a bony prominence. redness nonblancing skin intact redness (Active)  12/20/23 1838  Location: Perineum  Location Orientation: Bilateral  Staging: Stage 1 -  Intact skin with non-blanchable redness of a localized area usually over a bony prominence.  Wound Description (Comments): redness nonblancing skin intact redness  Present on Admission: Yes  Dressing Type Foam - Lift dressing to assess site every shift 12/21/23 0935   Obesity class II  Estimated body mass index is 33.55 kg/m as calculated from the following:   Height as of this encounter: 5\' 6"  (1.676 m).   Weight as of this encounter: 94.3 kg.  Code Status: Full code DVT Prophylaxis:     Level of Care: Level of care: Telemetry Cardiac Family  Communication: Updated patient Disposition Plan:      Remains inpatient appropriate: Pending TEE, IV diuresis   Procedures:  2D echo  Consultants:   Cardiology  Antimicrobials:   Anti-infectives (From admission, onward)    Start     Dose/Rate Route Frequency Ordered Stop   12/20/23 2200  cefTRIAXone (ROCEPHIN) 2 g in sodium chloride 0.9 % 100 mL IVPB  Status:  Discontinued        2 g 200 mL/hr over 30 Minutes Intravenous Every 12 hours 12/20/23 0801 12/20/23 1019   12/20/23 1200  ampicillin (OMNIPEN) 2 g in sodium chloride 0.9 % 100 mL IVPB        2 g 300 mL/hr over 20 Minutes Intravenous Every 6 hours  12/20/23 0801     12/20/23 1115  cefTRIAXone (ROCEPHIN) 2 g in sodium chloride 0.9 % 100 mL IVPB        2 g 200 mL/hr over 30 Minutes Intravenous Every 12 hours 12/20/23 1019     12/20/23 1000  azithromycin (ZITHROMAX) tablet 500 mg        500 mg Oral Daily 12/19/23 0722 12/24/23 0959   12/20/23 0800  cefTRIAXone (ROCEPHIN) 2 g in sodium chloride 0.9 % 100 mL IVPB  Status:  Discontinued        2 g 200 mL/hr over 30 Minutes Intravenous Every 24 hours 12/19/23 0753 12/20/23 0801   12/19/23 0630  cefTRIAXone (ROCEPHIN) 1 g in sodium chloride 0.9 % 100 mL IVPB        1 g 200 mL/hr over 30 Minutes Intravenous  Once 12/19/23 0625 12/19/23 0715   12/19/23 0630  azithromycin (ZITHROMAX) 500 mg in sodium chloride 0.9 % 250 mL IVPB        500 mg 250 mL/hr over 60 Minutes Intravenous  Once 12/19/23 0625 12/19/23 0819          Medications  allopurinol  300 mg Oral Daily   azithromycin  500 mg Oral Daily   diclofenac Sodium  2 g Topical QID   guaiFENesin  600 mg Oral BID   metoprolol succinate  50 mg Oral Daily   pantoprazole  40 mg Oral QPM   pravastatin  20 mg Oral QPM   senna-docusate  1 tablet Oral BID   Warfarin - Pharmacist Dosing Inpatient   Does not apply q1600      Subjective:   Diquan Kassis was seen and examined today.  No acute complaints, feeling better asking about TEE.  Patient denies dizziness, chest pain, shortness of breath, abdominal pain, N/V/D. No acute events overnight.    Objective:   Vitals:   12/21/23 1958 12/21/23 2305 12/22/23 0545 12/22/23 0712  BP: (!) 161/78 (!) 141/74 (!) 150/81 (!) 157/96  Pulse: 70 69 70 76  Resp: 18 17 18 18   Temp: 99.2 F (37.3 C) 98.5 F (36.9 C) 98.1 F (36.7 C) 98 F (36.7 C)  TempSrc: Oral Oral Oral Oral  SpO2: 96% 92% 96% 95%  Weight:      Height:        Intake/Output Summary (Last 24 hours) at 12/22/2023 0940 Last data filed at 12/22/2023 0839 Gross per 24 hour  Intake 1280 ml  Output 1050 ml  Net 230 ml     Wt  Readings from Last 3 Encounters:  12/21/23 94.3 kg  10/30/23 82.4 kg  10/22/23 86.2 kg     Exam General: Alert and oriented x 3, NAD Cardiovascular: S1 S2 auscultated,  RRR Respiratory: Diminished  breath sound at the bases Gastrointestinal: Soft, nontender, nondistended, + bowel sounds Ext: 1+  pedal edema bilaterally Neuro: no new deficits Psych: Normal affect     Data Reviewed:  I have personally reviewed following labs    CBC Lab Results  Component Value Date   WBC 10.4 12/22/2023   RBC 3.73 (L) 12/22/2023   HGB 11.2 (L) 12/22/2023   HCT 35.0 (L) 12/22/2023   MCV 93.8 12/22/2023   MCH 30.0 12/22/2023   PLT 123 (L) 12/22/2023   MCHC 32.0 12/22/2023   RDW 15.0 12/22/2023   LYMPHSABS 1.0 12/21/2023   MONOABS 0.9 12/21/2023   EOSABS 0.2 12/21/2023   BASOSABS 0.1 12/21/2023     Last metabolic panel Lab Results  Component Value Date   NA 136 12/22/2023   K 4.2 12/22/2023   CL 100 12/22/2023   CO2 22 12/22/2023   BUN 24 (H) 12/22/2023   CREATININE 1.14 12/22/2023   GLUCOSE 117 (H) 12/22/2023   GFRNONAA 60 (L) 12/22/2023   GFRAA >60 08/29/2018   CALCIUM 8.6 (L) 12/22/2023   PROT 7.1 08/29/2023   ALBUMIN 4.1 08/29/2023   LABGLOB 3.0 08/29/2023   AGRATIO 1.1 10/03/2016   BILITOT 0.6 08/29/2023   ALKPHOS 155 (H) 08/29/2023   AST 22 08/29/2023   ALT 13 08/29/2023   ANIONGAP 14 12/22/2023    CBG (last 3)  No results for input(s): "GLUCAP" in the last 72 hours.    Coagulation Profile: Recent Labs  Lab 12/19/23 0531 12/20/23 0502 12/21/23 0849 12/22/23 0227  INR 2.7* 3.5* 3.8* 3.8*     Radiology Studies: I have personally reviewed the imaging studies  ECHOCARDIOGRAM COMPLETE Result Date: 12/20/2023    ECHOCARDIOGRAM REPORT   Patient Name:   MARCELLE BEBOUT Date of Exam: 12/20/2023 Medical Rec #:  [id number]     Height:       66.0 in Accession #:    [id number]    Weight:       200.8 lb Date of Birth:  November 19, 1927     BSA:          2.003 m Patient Age:     89 years      BP:           150/79 mmHg Patient Gender: M             HR:           79 bpm. Exam Location:  Inpatient Procedure: 2D Echo, Cardiac Doppler, Color Doppler and Intracardiac            Opacification Agent (Both Spectral and Color Flow Doppler were            utilized during procedure). Indications:    Bacteremia  History:        Patient has prior history of Echocardiogram examinations, most                 recent 09/10/2023. Risk Factors:Hypertension.                 Aortic Valve: 23 mm Edwards valve is present in the aortic                 position.  Sonographer:    Karma Ganja Referring Phys: [id number] Cleora Fleet  Sonographer Comments: Technically difficult study due to poor echo windows. IMPRESSIONS  1. Left ventricular ejection fraction, by estimation, is 40%. The left ventricle has mildly decreased function. Left ventricular endocardial border not optimally defined  to evaluate regional wall motion. There is moderate left ventricular hypertrophy. Left ventricular diastolic parameters are indeterminate. There is the interventricular septum is flattened in systole and diastole, consistent with right ventricular pressure and volume overload.  2. Right ventricular systolic function was not well visualized. The right ventricular size is not well visualized. There is moderately elevated pulmonary artery systolic pressure.  3. The mitral valve was not well visualized. No evidence of mitral valve regurgitation. No evidence of mitral stenosis.  4. The tricuspid valve is abnormal.  5. 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 The aortic valve has been repaired/replaced. Aortic valve regurgitation is not visualized. No aortic stenosis is present. There is a 23 mm Edwards valve present in the aortic position.  6. The pulmonic valve was abnormal.  7. The inferior vena cava is normal in size with greater than 50% respiratory  variability, suggesting right atrial pressure of 3 mmHg. FINDINGS  Left Ventricle: Left ventricular ejection fraction, by estimation, is 40%. The left ventricle has mildly decreased function. Left ventricular endocardial border not optimally defined to evaluate regional wall motion. Definity contrast agent was given IV  to delineate the left ventricular endocardial borders. Strain imaging was not performed. The left ventricular internal cavity size was normal in size. There is moderate left ventricular hypertrophy. The interventricular septum is flattened in systole and diastole, consistent with right ventricular pressure and volume overload. Left ventricular diastolic parameters are indeterminate. Right Ventricle: The right ventricular size is not well visualized. Right vetricular wall thickness was not well visualized. Right ventricular systolic function was not well visualized. There is moderately elevated pulmonary artery systolic pressure. The  tricuspid regurgitant velocity is 3.50 m/s, and with an assumed right atrial pressure of 3 mmHg, the estimated right ventricular systolic pressure is 52.0 mmHg. Left Atrium: Left atrial size was not well visualized. Right Atrium: Right atrial size was not well visualized. Pericardium: There is no evidence of pericardial effusion. Mitral Valve: The mitral valve was not well visualized. No evidence of mitral valve regurgitation. No evidence of mitral valve stenosis. Tricuspid Valve: The tricuspid valve is abnormal. Tricuspid valve regurgitation is mild . No evidence of tricuspid stenosis. 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 The aortic valve has been repaired/replaced. Aortic valve regurgitation is not visualized. No aortic stenosis is present. Aortic valve mean gradient measures 1.0 mmHg. Aortic valve peak gradient measures 2.0 mmHg. Aortic valve area, by VTI measures  2.79 cm. There is a 23 mm Edwards valve present in the aortic position. Pulmonic Valve: The pulmonic valve was abnormal. Pulmonic valve regurgitation is mild. No evidence of pulmonic stenosis. Aorta: The aortic root was not well visualized. Venous: The inferior vena cava is normal in size with greater than 50% respiratory variability, suggesting right atrial pressure of 3 mmHg. IAS/Shunts: The interatrial septum was not well visualized. Additional Comments: 3D imaging was not performed. A device lead is visualized in the right atrium and right ventricle.  LEFT VENTRICLE PLAX 2D LVIDd:         4.70 cm      Diastology LVIDs:         3.70 cm      LV e' medial:    8.27 cm/s LV PW:         1.30 cm      LV E/e'  medial:  15.1 LV IVS:        1.30 cm      LV e' lateral:   7.83 cm/s LVOT diam:     1.90 cm      LV E/e' lateral: 16.0 LV SV:         33 LV SV Index:   17 LVOT Area:     2.84 cm  LV Volumes (MOD) LV vol d, MOD A2C: 87.5 ml LV vol d, MOD A4C: 114.0 ml LV vol s, MOD A2C: 44.2 ml LV vol s, MOD A4C: 67.6 ml LV SV MOD A2C:     43.3 ml LV SV MOD A4C:     114.0 ml LV SV MOD BP:      46.3 ml RIGHT VENTRICLE            IVC RV Basal diam:  4.50 cm    IVC diam: 1.60 cm RV S prime:     5.87 cm/s TAPSE (M-mode): 1.6 cm LEFT ATRIUM              Index        RIGHT ATRIUM           Index LA diam:        6.10 cm  3.05 cm/m   RA Area:     25.00 cm LA Vol (A2C):   83.6 ml  41.73 ml/m  RA Volume:   85.70 ml  42.78 ml/m LA Vol (A4C):   118.0 ml 58.91 ml/m LA Biplane Vol: 103.0 ml 51.42 ml/m  AORTIC VALVE AV Area (Vmax):    2.71 cm AV Area (Vmean):   2.62 cm AV Area (VTI):     2.79 cm AV Vmax:           70.60 cm/s AV Vmean:          46.200 cm/s AV VTI:            0.119 m AV Peak Grad:      2.0 mmHg AV Mean Grad:      1.0 mmHg LVOT Vmax:         67.40 cm/s LVOT Vmean:        42.700 cm/s LVOT VTI:          0.117 m LVOT/AV VTI ratio: 0.98 MITRAL VALVE                TRICUSPID VALVE MV Area (PHT): 4.10 cm     TR Peak grad:    49.0 mmHg MV Decel Time: 185 msec     TR Vmax:        350.00 cm/s MV E velocity: 125.00 cm/s MV A velocity: 34.00 cm/s   SHUNTS MV E/A ratio:  3.68         Systemic VTI:  0.12 m                             Systemic Diam: 1.90 cm Dina Rich MD Electronically signed by Dina Rich MD Signature Date/Time: 12/20/2023/3:32:16 PM    Final        Thad Ranger M.D. [facility] 12/22/2023, 9:40 AM  Available via Epic secure chat 7am-7pm After 7 pm, please refer to night coverage provider listed on amion.

## 2023-12-23 ENCOUNTER — Inpatient Hospital Stay (HOSPITAL_COMMUNITY): Admitting: Anesthesiology

## 2023-12-23 ENCOUNTER — Encounter (HOSPITAL_COMMUNITY): Payer: Self-pay | Admitting: Family Medicine

## 2023-12-23 ENCOUNTER — Encounter (HOSPITAL_COMMUNITY)
Admission: EM | Disposition: A | Discharge status: Home Health | Payer: Self-pay | Source: Home / Self Care | Attending: Family Medicine

## 2023-12-23 ENCOUNTER — Inpatient Hospital Stay (HOSPITAL_COMMUNITY)

## 2023-12-23 DIAGNOSIS — I5021 Acute systolic (congestive) heart failure: Secondary | ICD-10-CM | POA: Diagnosis not present

## 2023-12-23 DIAGNOSIS — I110 Hypertensive heart disease with heart failure: Secondary | ICD-10-CM | POA: Diagnosis not present

## 2023-12-23 DIAGNOSIS — I4891 Unspecified atrial fibrillation: Secondary | ICD-10-CM | POA: Diagnosis not present

## 2023-12-23 DIAGNOSIS — R7881 Bacteremia: Secondary | ICD-10-CM | POA: Diagnosis not present

## 2023-12-23 DIAGNOSIS — I2510 Atherosclerotic heart disease of native coronary artery without angina pectoris: Secondary | ICD-10-CM

## 2023-12-23 DIAGNOSIS — I330 Acute and subacute infective endocarditis: Secondary | ICD-10-CM

## 2023-12-23 DIAGNOSIS — I509 Heart failure, unspecified: Secondary | ICD-10-CM

## 2023-12-23 DIAGNOSIS — Z950 Presence of cardiac pacemaker: Secondary | ICD-10-CM | POA: Diagnosis not present

## 2023-12-23 DIAGNOSIS — I340 Nonrheumatic mitral (valve) insufficiency: Secondary | ICD-10-CM | POA: Diagnosis not present

## 2023-12-23 HISTORY — PX: TRANSESOPHAGEAL ECHOCARDIOGRAM (CATH LAB): EP1270

## 2023-12-23 LAB — CBC
HCT: 36.6 % — ABNORMAL LOW (ref 39.0–52.0)
Hemoglobin: 11.7 g/dL — ABNORMAL LOW (ref 13.0–17.0)
MCH: 29.8 pg (ref 26.0–34.0)
MCHC: 32 g/dL (ref 30.0–36.0)
MCV: 93.1 fL (ref 80.0–100.0)
Platelets: 177 10*3/uL (ref 150–400)
RBC: 3.93 MIL/uL — ABNORMAL LOW (ref 4.22–5.81)
RDW: 14.9 % (ref 11.5–15.5)
WBC: 8.5 10*3/uL (ref 4.0–10.5)
nRBC: 0 % (ref 0.0–0.2)

## 2023-12-23 LAB — BASIC METABOLIC PANEL
Anion gap: 13 (ref 5–15)
BUN: 21 mg/dL (ref 8–23)
CO2: 24 mmol/L (ref 22–32)
Calcium: 8.5 mg/dL — ABNORMAL LOW (ref 8.9–10.3)
Chloride: 99 mmol/L (ref 98–111)
Creatinine, Ser: 1.01 mg/dL (ref 0.61–1.24)
GFR, Estimated: >60 mL/min (ref >60)
Glucose, Bld: 99 mg/dL (ref 70–99)
Potassium: 3.5 mmol/L (ref 3.5–5.1)
Sodium: 136 mmol/L (ref 135–145)

## 2023-12-23 LAB — ECHO TEE

## 2023-12-23 LAB — PROTIME-INR
INR: 3 — ABNORMAL HIGH (ref 0.8–1.2)
Prothrombin Time: 31.2 s — ABNORMAL HIGH (ref 11.4–15.2)

## 2023-12-23 SURGERY — TRANSESOPHAGEAL ECHOCARDIOGRAM (TEE) (CATHLAB)
Anesthesia: General

## 2023-12-23 MED ORDER — SODIUM CHLORIDE 0.9 % IV SOLN: INTRAVENOUS | Status: DC | PRN

## 2023-12-23 MED ORDER — PROPOFOL 10 MG/ML IV BOLUS
INTRAVENOUS | Status: DC | PRN
  Administered 2023-12-23 (×2): 20 mg via INTRAVENOUS

## 2023-12-23 MED ORDER — SODIUM CHLORIDE 0.9% FLUSH: 3.0000 mL | INTRAVENOUS | Status: DC | PRN

## 2023-12-23 MED ORDER — SODIUM CHLORIDE 0.9% FLUSH: 3.0000 mL | Freq: Two times a day (BID) | INTRAVENOUS | Status: DC

## 2023-12-23 MED ORDER — POTASSIUM CHLORIDE CRYS ER 20 MEQ PO TBCR
60.0000 meq | EXTENDED_RELEASE_TABLET | Freq: Once | ORAL | Status: AC
  Administered 2023-12-23: 60 meq via ORAL
  Filled 2023-12-23: qty 3

## 2023-12-23 MED ORDER — PROPOFOL 500 MG/50ML IV EMUL
INTRAVENOUS | Status: DC | PRN
  Administered 2023-12-23: 75 ug/kg/min via INTRAVENOUS

## 2023-12-23 MED ORDER — LIDOCAINE 2% (20 MG/ML) 5 ML SYRINGE
INTRAMUSCULAR | Status: DC | PRN
  Administered 2023-12-23: 100 mg via INTRAVENOUS

## 2023-12-23 NOTE — Evaluation (Signed)
 Physical Therapy Evaluation Patient Details Name: Douglas [name] MRN: [id number] DOB: 1927/12/18 Today's Date: 12/23/2023  History of Present Illness  89 year old male who presented to ED from home with c/o cough, chills and SOB. Admitted for treatment of PNA with leukocytosis and abnormal chest x-ray. PMH: Afib on warfarin, s/p AVR, CAD, HTN, h/o AVM, h/o esophageal ulcer, h/o CHB with pacemaker in place, GERD  Clinical Impression  PTA pt living with his wife of 71 years in single story home with 3 step to enter. Pt completely independent using is tractor the day prior to hospitalization, ambulates without AD and is independent in ADLs and iADLs. Pt is currently limited in safe mobility, by increased fatigue, generalized weakness and mild instability. Pt is supervision for transfers and contact guard for ambulation in hallway with RW. PT recommending HHPT at discharge. PT will continue to follow acutely.        If plan is discharge home, recommend the following: A little help with walking and/or transfers;A little help with bathing/dressing/bathroom;Assistance with cooking/housework;Direct supervision/assist for medications management;Direct supervision/assist for financial management;Assist for transportation;Help with stairs or ramp for entrance   Can travel by private vehicle    Yes    Equipment Recommendations Rolling walker (2 wheels)     Functional Status Assessment Patient has had a recent decline in their functional status and demonstrates the ability to make significant improvements in function in a reasonable and predictable amount of time.     Precautions / Restrictions Precautions Precautions: Fall Restrictions Weight Bearing Restrictions Per Provider Order: No      Mobility  Bed Mobility               General bed mobility comments: sitting in recliner on entry    Transfers Overall transfer level: Modified independent Equipment used: Rolling walker (2 wheels)                General transfer comment: good power up increased time to steady in RW, no physical assist required    Ambulation/Gait Ambulation/Gait assistance: Contact guard assist Gait Distance (Feet): 60 Feet Assistive device: Rolling walker (2 wheels) Gait Pattern/deviations: Step-through pattern, Trunk flexed, Narrow base of support, Shuffle Gait velocity: slowed Gait velocity interpretation: <1.31 ft/sec, indicative of household ambulator   General Gait Details: slowed, shuffling gait, vc for RW proximity, pt unfamiliar with RW use, very narrow BoS, which he is able to correct for a few steps before returning to narrow BoS        Balance Overall balance assessment: Mild deficits observed, not formally tested                                           Pertinent Vitals/Pain Pain Assessment Pain Assessment: No/denies pain    Home Living Family/patient expects to be discharged to:: Private residence Living Arrangements: Spouse/significant other Available Help at Discharge: Family;Available 24 hours/day (lives with wife and 2 sons live on property) Type of Home: House Home Access: Stairs to enter Entrance Stairs-Rails: None Secretary/administrator of Steps: 3   Home Layout: One level Home Equipment: BSC/3in1;Cane - single point      Prior Function Prior Level of Function : Independent/Modified Independent             Mobility Comments: dairy farmer, on tractor 2 days ago       Extremity/Trunk Assessment   Upper  Extremity Assessment Upper Extremity Assessment: Defer to OT evaluation    Lower Extremity Assessment Lower Extremity Assessment: Generalized weakness    Cervical / Trunk Assessment Cervical / Trunk Assessment: Kyphotic  Communication   Communication Communication: No apparent difficulties    Cognition Arousal: Alert Behavior During Therapy: WFL for tasks assessed/performed   PT - Cognitive impairments: No apparent  impairments                         Following commands: Intact       Cueing Cueing Techniques: Verbal cues, Tactile cues     General Comments General comments (skin integrity, edema, etc.): SpO2 89-94%O2 on RA with ambulation, max HR noted during ambulation 86bpm, BP in sitting 156/74        Assessment/Plan    PT Assessment Patient needs continued PT services  PT Problem List Decreased strength;Decreased activity tolerance;Decreased balance       PT Treatment Interventions DME instruction;Gait training;Stair training;Functional mobility training;Therapeutic activities;Therapeutic exercise;Balance training;Cognitive remediation;Patient/family education    PT Goals (Current goals can be found in the Care Plan section)  Acute Rehab PT Goals PT Goal Formulation: With patient Time For Goal Achievement: 01/06/24 Potential to Achieve Goals: Fair    Frequency Min 1X/week        AM-PAC PT "6 Clicks" Mobility  Outcome Measure Help needed turning from your back to your side while in a flat bed without using bedrails?: A Little Help needed moving from lying on your back to sitting on the side of a flat bed without using bedrails?: A Little Help needed moving to and from a bed to a chair (including a wheelchair)?: A Little Help needed standing up from a chair using your arms (e.g., wheelchair or bedside chair)?: A Little Help needed to walk in hospital room?: A Little Help needed climbing 3-5 steps with a railing? : A Little 6 Click Score: 18    End of Session Equipment Utilized During Treatment: Gait belt Activity Tolerance: Patient tolerated treatment well Patient left: in chair;with call bell/phone within reach;with chair alarm set Nurse Communication: Mobility status PT Visit Diagnosis: Unsteadiness on feet (R26.81);Other abnormalities of gait and mobility (R26.89);Muscle weakness (generalized) (M62.81)    Time: 1610-9604 PT Time Calculation (min) (ACUTE ONLY):  36 min   Charges:   PT Evaluation $PT Eval Moderate Complexity: 1 Mod PT Treatments $Gait Training: 8-22 mins PT General Charges $$ ACUTE PT VISIT: 1 Visit         Evelyna Folker B. Beverely Risen PT, DPT Acute Rehabilitation Services Please use secure chat or  Call Office [phone]   [facility] 12/23/2023, 9:56 AM

## 2023-12-23 NOTE — Consult Note (Addendum)
 Cardiology Consultation   Patient ID: Douglas [name] MRN: [id number]; DOB: 06/19/1928  Admit date: 12/19/2023 Date of Consult: 12/23/2023  PCP:  Carylon Perches, MD   [facility] Providers Cardiologist:  Nona Dell, MD  Electrophysiologist:  Lewayne Bunting, MD  {   Patient Profile:   Douglas [name] is a 89 y.o. male with a hx of CAD/VHD (s/p CABG and Bental AVR 2013), CHB w/PPM, AFib who is being seen 12/23/2023 for the evaluation of bacteremia in setting of PPM at the request of Dr. Alanda Slim.  Device information MDT dual chamber PPM 5076 (x2) System implanted 02/01/2012  PROGRAMMED: VVIR 70 and known to be dependent by prior in clinic checks  History of Present Illness:   Douglas [name] was admitted 2//27/25 with c/o cough, chills, shortness of breath, generalized malaise and weakness.  COVID, flu, RSV negative.  WBC elevated to 14.9. Lactic acid 2.6>2.9>2.3>2.4.  CXR showed retrocardiac opacity with indistinct left hemidiaphragm, findings suspicious for pneumonia vs atelectasis. There was also mild patchy opacities in the right lower lung.  Patient was admitted to the hospitalist service for treatment of sepsis due to pneumonia.  Blood cultures detected enterococcus faecalis.  ID consulted and recommended cards for suspect HF component and /EP be brought on board as well  Cards saw him 3/1 > IVF stopped > diuretics started, LVEF 40% with evidence of volume oL  Planned for TEE today  LABS K+ 3.5 BUN/Creat 21/1.01 BNP 820 WBC 8.5 H/H 11/36 Plts 177 INR 3.0  Slight sore throat post TEE, no SOB, no CP   Past Medical History:  Diagnosis Date   Acute upper GI bleed 05/18/2016   Aneurysm of thoracic aorta (HCC)    Ascending. 5.7 x 5.5cm - Bentall procedure   Aortic stenosis    Bioprosthetic AVR   Arthritis    Atrial fibrillation (HCC)    Documented on pacer interrogation 6/13   Complete heart block (HCC)    Status post pacemaker placement   Coronary  atherosclerosis of native coronary artery    Multivessel - LIMA to LAD, SVG to OM, SVG to PDA   DJD (degenerative joint disease)    Esophageal dysmotility    Essential hypertension    Fibromyalgia    Gout    H/O arteriovenous malformation (AVM)    H/O hiatal hernia    Hemorrhoid    History of esophageal ulcer    History of skin cancer    Hx of adenomatous colonic polyps    Iron deficiency anemia due to chronic blood loss    Melena    Pacemaker-Medtronic    PMR (polymyalgia rheumatica) (HCC)    SIRS (systemic inflammatory response syndrome) (HCC) 01/16/2015    Past Surgical History:  Procedure Laterality Date   AORTIC VALVE REPLACEMENT  01/24/12   APPENDECTOMY     BACK SURGERY     1965 or 1966 lumbar back surgery   BENTALL PROCEDURE  01/24/2012   Procedure: BENTALL PROCEDURE;  Surgeon: Kerin Perna, MD;  Location: [facility] OR;  Service: Open Heart Surgery;  Laterality: N/A;   BILATERAL KNEE ARTHROSCOPY     CATARACT EXTRACTION W/PHACO Left 12/03/2016   Procedure: CATARACT EXTRACTION PHACO AND INTRAOCULAR LENS PLACEMENT LEFT EYE;  Surgeon: Gemma Payor, MD;  Location: [facility] ORS;  Service: Ophthalmology;  Laterality: Left;  CDE:  10.87   CATARACT EXTRACTION W/PHACO Right 12/13/2016   Procedure: CATARACT EXTRACTION PHACO AND INTRAOCULAR LENS PLACEMENT (IOC);  Surgeon: Gemma Payor, MD;  Location: [facility]  ORS;  Service: Ophthalmology;  Laterality: Right;  right cde 11.78   CORONARY ARTERY BYPASS GRAFT  01/24/2012   Procedure: CORONARY ARTERY BYPASS GRAFTING (CABG);  Surgeon: Kerin Perna, MD;  Location: [facility] OR;  Service: Open Heart Surgery;  Laterality: N/A;   ESOPHAGOGASTRODUODENOSCOPY  06/17/2012   Procedure: ESOPHAGOGASTRODUODENOSCOPY (EGD);  Surgeon: Malissa Hippo, MD;  Location: [facility] SUITE;  Service: Endoscopy;  Laterality: N/A;  ED   ESOPHAGOGASTRODUODENOSCOPY N/A 05/19/2016   Procedure: ESOPHAGOGASTRODUODENOSCOPY (EGD);  Surgeon: West Bali, MD;  Location: [facility] SUITE;  Service: Endoscopy;   Laterality: N/A;   HEMORROIDECTOMY     HERNIA REPAIR     Left inguinal hernia repair     PACEMAKER PLACEMENT     Medtronic 4/13 - Dr. Johney Frame   PERMANENT PACEMAKER INSERTION N/A 02/01/2012   Procedure: PERMANENT PACEMAKER INSERTION;  Surgeon: Hillis Range, MD; Medtronic Adapta L model ADDRL 1 (serial number [id number])       Home Medications:  Prior to Admission medications   Medication Sig Start Date End Date Taking? Authorizing Provider  acetaminophen (TYLENOL) 500 MG tablet Take 1,000 mg by mouth every 6 (six) hours as needed for mild pain.   Yes [provider]  allopurinol (ZYLOPRIM) 300 MG tablet Take 300 mg by mouth daily. Take 1  daily   Yes [provider]  furosemide (LASIX) 20 MG tablet Take 20 mg by mouth every other day. 11/25/23  Yes [provider]  metoprolol succinate (TOPROL-XL) 25 MG 24 hr tablet TAKE (2) TABLETS BY MOUTH DAILY. 10/17/23  Yes Jonelle Sidle, MD  pantoprazole (PROTONIX) 40 MG tablet TAKE ONE TABLET BY MOUTH ONCE DAILY IN THE MORNING. 11/24/15  Yes Jonelle Sidle, MD  pravastatin (PRAVACHOL) 20 MG tablet Take 20 mg by mouth every evening.  10/27/15  Yes [provider]  sennosides-docusate sodium (SENOKOT-S) 8.6-50 MG tablet Take 1 tablet by mouth 2 (two) times daily. 12/30/17  Yes Rehman, Joline Maxcy, MD  warfarin (COUMADIN) 5 MG tablet TAKE 1 TABLET BY MOUTH ONCE DAILY OR AS DIRECTED. Patient taking differently: Take 5 mg by mouth daily. Or as directed 10/01/23  Yes Jonelle Sidle, MD    Inpatient Medications: Scheduled Meds:  [MAR Hold] allopurinol  300 mg Oral Daily   azithromycin  500 mg Oral Daily   [MAR Hold] diclofenac Sodium  2 g Topical QID   [MAR Hold] guaiFENesin  600 mg Oral BID   [MAR Hold] metoprolol succinate  50 mg Oral Daily   [MAR Hold] pantoprazole  40 mg Oral QPM   [MAR Hold] pravastatin  20 mg Oral QPM   [MAR Hold] senna-docusate  1 tablet Oral BID   sodium chloride flush  3-10 mL  Intravenous Q12H   [MAR Hold] Warfarin - Pharmacist Dosing Inpatient   Does not apply q1600   Continuous Infusions:  [MAR Hold] ampicillin (OMNIPEN) IV 2 g (12/23/23 0527)   [MAR Hold] cefTRIAXone (ROCEPHIN)  IV Stopped (12/22/23 2239)   PRN Meds: [BJY Hold] acetaminophen **OR** [MAR Hold] acetaminophen, [MAR Hold] bisacodyl, [MAR Hold] dextromethorphan, [MAR Hold] fentaNYL (SUBLIMAZE) injection, [MAR Hold] ipratropium-albuterol, [MAR Hold] ondansetron **OR** [MAR Hold] ondansetron (ZOFRAN) IV, [MAR Hold] oxyCODONE, sodium chloride flush, [MAR Hold] traZODone  Allergies:    Allergies  Allergen Reactions   Imdur [Isosorbide Nitrate] Other (See Comments)    Nausea and lightheaded     Social History:   Social History   Socioeconomic History   Marital status: Married  Spouse name: Not on file   Number of children: 4   Years of education: Not on file   Highest education level: Not on file  Occupational History   Occupation: Retired    Comment: Engineer, maintenance  Tobacco Use   Smoking status: Never   Smokeless tobacco: Never  Vaping Use   Vaping status: Never Used  Substance and Sexual Activity   Alcohol use: No    Alcohol/week: 0.0 standard drinks of alcohol   Drug use: No   Sexual activity: Never    Birth control/protection: None  Other Topics Concern   Not on file  Social History Narrative   Lives with wife, has been married since 48.   Social Drivers of Corporate investment banker Strain: Not on file  Food Insecurity: No Food Insecurity (12/19/2023)   Hunger Vital Sign    Worried About Running Out of Food in the Last Year: Never true    Ran Out of Food in the Last Year: Never true  Transportation Needs: No Transportation Needs (12/19/2023)   PRAPARE - Administrator, Civil Service (Medical): No    Lack of Transportation (Non-Medical): No  Physical Activity: Not on file  Stress: Not on file  Social Connections: Moderately Isolated (12/19/2023)   Social  Connection and Isolation Panel [NHANES]    Frequency of Communication with Friends and Family: Never    Frequency of Social Gatherings with Friends and Family: Once a week    Attends Religious Services: 1 to 4 times per year    Active Member of [employer] or Organizations: No    Attends Banker Meetings: Never    Marital Status: Married  Catering manager Violence: Not At Risk (12/19/2023)   Humiliation, Afraid, Rape, and Kick questionnaire    Fear of Current or Ex-Partner: No    Emotionally Abused: No    Physically Abused: No    Sexually Abused: No    Family History:   Family History  Problem Relation Age of Onset   Kidney disease Mother    Heart disease Father    Anesthesia problems Neg Hx    Hypotension Neg Hx    Malignant hyperthermia Neg Hx    Pseudochol deficiency Neg Hx      ROS:  Please see the history of present illness.  All other ROS reviewed and negative.     Physical Exam/Data:   Vitals:   12/23/23 0418 12/23/23 0500 12/23/23 0755 12/23/23 1108  BP: (!) 158/82  (!) 147/76 (!) 167/82  Pulse: 71  70 74  Resp: 18  18 (!) 25  Temp: 97.6 F (36.4 C)  97.7 F (36.5 C) 98.5 F (36.9 C)  TempSrc: Oral  Oral Temporal  SpO2: 98%  98% 100%  Weight:  93.3 kg    Height:        Intake/Output Summary (Last 24 hours) at 12/23/2023 1119 Last data filed at 12/23/2023 0756 Gross per 24 hour  Intake 2001.5 ml  Output 3625 ml  Net -1623.5 ml      12/23/2023    5:00 AM 12/21/2023    4:29 AM 12/20/2023    5:00 AM  Last 3 Weights  Weight (lbs) 205 lb 11 oz 207 lb 14.3 oz 200 lb 12.8 oz  Weight (kg) 93.3 kg 94.3 kg 91.082 kg     Body mass index is 33.2 kg/m.  General:  Well nourished, well developed, in no acute distress HEENT: normal Neck: no JVD Vascular: No  carotid bruits; Distal pulses 2+ bilaterally Cardiac:  RRR; 2/6 SM, no gallops or rubs Lungs:  CTA b/l, no wheezing, rhonchi or rales  Abd: soft, nontender Ext: no edema Musculoskeletal:  No  deformities Skin: warm and dry  Neuro:  no focal abnormalities noted Psych:  Normal affect   EKG:  The EKG was personally reviewed and demonstrates:    V paced 70  Telemetry:  Telemetry was personally reviewed and demonstrates:   V paced  Relevant CV Studies:  12/23/23: TEE FINDINGS:   LEFT VENTRICLE: EF = 40-45% with global hypokinesi RIGHT VENTRICLE: Enlarged size and grossly normal function.  LEFT ATRIUM: No thrombus/mass. LEFT ATRIAL APPENDAGE: No thrombus/mass.  RIGHT ATRIUM: No thrombus/mass. RA and RV leads visualized without vegetations AORTIC VALVE and ASCENDING AORTA:  S/P repair 01/25/2012 with 28 mm Bentall conduit and 23 mm pericardial Aortic valve. No evidence of aortic root abscess or aortic valve vegetation MITRAL VALVE:    Normal structure. Mild-moderate regurgitation. No vegetation. TRICUSPID VALVE: Normal structure. Moderate regurgitation. No vegetation. PULMONIC VALVE: Grossly normal structure. Trivial regurgitation. No apparent vegetation. INTERATRIAL SEPTUM: No PFO or ASD seen by color Doppler. PERICARDIUM: No effusion noted. DESCENDING AORTA: Moderate diffuse plaque seen CONCLUSION: No evidence of endocarditis. Difficult TEE intubation.    12/20/23: TTE 1. Left ventricular ejection fraction, by estimation, is 40%. The left  ventricle has mildly decreased function. Left ventricular endocardial  border not optimally defined to evaluate regional wall motion. There is  moderate left ventricular hypertrophy.  Left ventricular diastolic parameters are indeterminate. There is the  interventricular septum is flattened in systole and diastole, consistent  with right ventricular pressure and volume overload.   2. Right ventricular systolic function was not well visualized. The right  ventricular size is not well visualized. There is moderately elevated  pulmonary artery systolic pressure.   3. The mitral valve was not well visualized. No evidence of mitral valve   regurgitation. No evidence of mitral stenosis.   4. The tricuspid valve is abnormal.   5. History of Bentall procedure with ascending aorta using a 28-mm  Gelweave Valsalva graft and 23-mm Edwards pericardial valve      . The aortic valve has been repaired/replaced. Aortic valve  regurgitation is not visualized. No aortic stenosis is present. There is a  23 mm Edwards valve present in the aortic position.   6. The pulmonic valve was abnormal.   7. The inferior vena cava is normal in size with greater than 50%  respiratory variability, suggesting right atrial pressure of 3 mmHg.   Laboratory Data:  High Sensitivity Troponin:  No results for input(s): "TROPONINIHS" in the last 720 hours.   Chemistry Recent Labs  Lab 12/20/23 0502 12/21/23 0849 12/22/23 0227 12/23/23 0731  NA 133* 135 136 136  K 4.1 3.9 4.2 3.5  CL 106 102 100 99  CO2 19* 24 22 24   GLUCOSE 103* 150* 117* 99  BUN 27* 24* 24* 21  CREATININE 1.18 1.19 1.14 1.01  CALCIUM 8.6* 8.7* 8.6* 8.5*  MG 1.7  --   --   --   GFRNONAA 57* 57* 60* >60  ANIONGAP 8 9 14 13     No results for input(s): "PROT", "ALBUMIN", "AST", "ALT", "ALKPHOS", "BILITOT" in the last 168 hours. Lipids No results for input(s): "CHOL", "TRIG", "HDL", "LABVLDL", "LDLCALC", "CHOLHDL" in the last 168 hours.  Hematology Recent Labs  Lab 12/21/23 0849 12/22/23 0227 12/23/23 0731  WBC 11.8* 10.4 8.5  RBC 3.81* 3.73* 3.93*  HGB 11.3* 11.2* 11.7*  HCT 35.8* 35.0* 36.6*  MCV 94.0 93.8 93.1  MCH 29.7 30.0 29.8  MCHC 31.6 32.0 32.0  RDW 15.0 15.0 14.9  PLT 143* 123* 177   Thyroid No results for input(s): "TSH", "FREET4" in the last 168 hours.  BNP Recent Labs  Lab 12/21/23 0849  BNP 820.2*    DDimer No results for input(s): "DDIMER" in the last 168 hours.   Radiology/Studies:     Assessment and Plan:   e faecalis bacteremia  (on 12/19/23 one of 2 positive) Repeat BC drawn this AM ID notes: 2/28-c ampicillin 2/27-c ceftriaxone   2/27-3/1 azith continue ceftriaxone ampicillin pending TEE and repeat cultures   TEE without evidence of endocarditis/vegetations I think given his advanced age and pacer dependency, he would be a poor candidate for device extraction 12 year leads >   treat enterococcus bacteremia as per ID recommendations   2. Afib Suspect this is permanent CHA2DS2Vasc is 4, continue warfrain INR today is 3.0  Risk Assessment/Risk Scores:    For questions or updates, please contact  [facility] Please consult [email or web address] for contact info under    Signed, Sheilah Pigeon, PA-C  12/23/2023 11:19 AM   I have seen, examined the patient, and reviewed the above assessment and plan.    HPI: Douglas [name] is a 89 y.o. male with a hx of CAD/VHD (s/p CABG and Bental AVR 2013), CHB s/p PPM, AFib who is being seen 12/23/2023 for the evaluation of bacteremia in setting of PPM at the request of Dr. Alanda Slim.  Patient presented to the ED on 12/19/2023 with a chief complaint of cough, chills and shortness of breath.  Also inducing generally feeling weak during this time.  This was unusual for him as he is normally fairly active.  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 Blood cultures from admission were notable for Enterococcus faecalis.  Infectious disease was consulted and he has been started on IV ampicillin and ceftriaxone.  He underwent TEE today which showed no evidence of endocarditis or obvious lead vegetations.  Currently, he reports feeling improved and has no new or acute complaints.  GEN: No acute distress.   Cardiac: Normal rate, regular.  Left chest pocket without erythema, fluctuance, swelling, tenderness. Resp: Normal work of breathing.  Ext: No edema. Neuro: No gross focal deficits Psych: Normal affect   12/19/23 Blood culture: E. Faecalis 2/2 12/23/23 Blood culture: No growth <12 hours  TEE 12/23/23:   1. Left ventricular ejection fraction, by estimation, is 40 to 45%.  The  left ventricle has mildly decreased function. The left ventricle  demonstrates global hypokinesis.   2. Right ventricular systolic function is low normal. The right  ventricular size is mildly enlarged. There is moderately elevated  pulmonary artery systolic pressure. The estimated right ventricular  systolic pressure is 50.0 mmHg.   3. Left atrial size was severely dilated. No left atrial/left atrial  appendage thrombus was detected. The LAA emptying velocity was 17 cm/s.   4. Right atrial size was moderately dilated.   5. The mitral valve is normal in structure. Mild to moderate mitral valve  regurgitation. No evidence of mitral stenosis.   6. Tricuspid valve regurgitation is moderate.   7. The aortic valve has been repaired/replaced. There is mild  calcification of the aortic valve. There is mild thickening of the aortic  valve. Aortic valve regurgitation is not visualized. There is a 23 mm  Edwards bioprosthetic  valve present in the  aortic position. Procedure Date: 01/24/12.   8. S/P ascending aorta repair 01/25/2012 with 28 mm Bentall conduit. No  evidence of abscess. Aortic root/ascending aorta has been  repaired/replaced.   9. 3D performed of the aortic valve and demonstrates No evidence of  aortic valve endocarditis.    Assessment: Douglas [name] is a 89 year old male with a past medical history notable for coronary disease and valvular heart disease status post CABG and Bentall AVR in 2013, complete heart block status post pacemaker, persistent atrial fibrillation who is currently admitted for coccus faecalis bacteremia.  He has no obvious involvement of his CIED.  No evidence of pocket infection.  No obvious lead vegetations on TEE.  Additionally, he did not have any evidence of endocarditis.    Plan:  #. Enterococcus faecalis bacteremia #. Pacemaker in situ #. Complete heart block  -Given his advanced age of 76 years, dwell time of his pacemaker leads, complete dependency  on his pacemaker, no obvious pacemaker involvement and Enterococcus (rather than Staphylococcus, Propionibacterium, Candida), would favor medical management initially with observation rather than high risk lead extraction.  This was discussed with the patient and his family who voiced understanding and agreed. -Appreciate ID assistance with antibiotic management and duration.  #. Permanent atrial fibrillation:  #. Secondary hypercoagulable state:  -Continue warfarin.   Nobie Putnam, MD 12/23/2023 11:01 PM

## 2023-12-23 NOTE — Plan of Care (Signed)

## 2023-12-23 NOTE — Evaluation (Signed)
 Occupational Therapy Evaluation Patient Details Name: Douglas [name] MRN: [id number] DOB: 1928/05/09 Today's Date: 12/23/2023   History of Present Illness   89 year old male who presented to ED from home with c/o cough, chills and SOB. Admitted for treatment of PNA with leukocytosis and abnormal chest x-ray. PMH: Afib on warfarin, s/p AVR, CAD, HTN, h/o AVM, h/o esophageal ulcer, h/o CHB with pacemaker in place, GERD     Clinical Impressions Pt presents with decline in function and safety with ADLs and ADL mobility with impaired strength, balance and endurance. PTA pt lives at home with his wife of 71 years with 2 sons lving on same property; they run a dairy farm. Pt reports that . Pt he is still using his tractor, walks without and AD and is Ind with ADLs and IADLs. Pt currenlty requires CGA - Sup with LB ADLs and mobility using RW. VSs remained stable throughout activity. Pt would benefit from acute OT services to address impairments to maximize level of function and safety     If plan is discharge home, recommend the following:   A little help with bathing/dressing/bathroom;A little help with walking and/or transfers;Assist for transportation     Functional Status Assessment   Patient has had a recent decline in their functional status and demonstrates the ability to make significant improvements in function in a reasonable and predictable amount of time.     Equipment Recommendations   None recommended by OT     Recommendations for Other Services         Precautions/Restrictions   Precautions Precautions: Fall Recall of Precautions/Restrictions: Intact Restrictions Weight Bearing Restrictions Per Provider Order: No     Mobility Bed Mobility               General bed mobility comments: pt in recliner upon OT arrival    Transfers Overall transfer level: Needs assistance Equipment used: Rolling walker (2 wheels) Transfers: Sit to/from Stand, Bed to  chair/wheelchair/BSC Sit to Stand: Contact guard assist, Supervision                  Balance Overall balance assessment: Mild deficits observed, not formally tested                                         ADL either performed or assessed with clinical judgement   ADL Overall ADL's : Needs assistance/impaired Eating/Feeding: Independent   Grooming: Wash/dry hands;Wash/dry face;Contact guard assist;Standing   Upper Body Bathing: Set up;Sitting   Lower Body Bathing: Contact guard assist;Sitting/lateral leans;Sit to/from stand   Upper Body Dressing : Set up;Sitting   Lower Body Dressing: Contact guard assist;Sit to/from stand   Toilet Transfer: Supervision/safety;Ambulation;Rolling walker (2 wheels);Cueing for safety   Toileting- Clothing Manipulation and Hygiene: Contact guard assist;Sit to/from stand       Functional mobility during ADLs: Supervision/safety;Rolling walker (2 wheels);Cueing for safety General ADL Comments: pt very pleasant and cooperative, CGA - Sup for safety     Vision Baseline Vision/History: 1 Wears glasses Ability to See in Adequate Light: 0 Adequate Patient Visual Report: No change from baseline       Perception         Praxis         Pertinent Vitals/Pain Pain Assessment Pain Assessment: No/denies pain     Extremity/Trunk Assessment Upper Extremity Assessment Upper Extremity Assessment: Generalized weakness  Lower Extremity Assessment Lower Extremity Assessment: Defer to PT evaluation   Cervical / Trunk Assessment Cervical / Trunk Assessment: Kyphotic   Communication Communication Communication: No apparent difficulties   Cognition Arousal: Alert Behavior During Therapy: WFL for tasks assessed/performed Cognition: No apparent impairments                               Following commands: Intact       Cueing  General Comments   Cueing Techniques: Verbal cues;Tactile cues       Exercises     Shoulder Instructions      Home Living Family/patient expects to be discharged to:: Private residence Living Arrangements: Spouse/significant other Available Help at Discharge: Family;Available 24 hours/day Type of Home: House Home Access: Stairs to enter [employer] of Steps: 3 Entrance Stairs-Rails: None Home Layout: One level     Bathroom Shower/Tub: Producer, television/film/video: Handicapped height Bathroom Accessibility: Yes   Home Equipment: BSC/3in1;Cane - single point   Additional Comments: 3 in 1 used as shower chair if needed per pt      Prior Functioning/Environment Prior Level of Function : Independent/Modified Independent             Mobility Comments: Engineer, maintenance, on tractor 2 days ago ADLs Comments: Ind with ADLs/selfcare    OT Problem List: Decreased activity tolerance;Decreased knowledge of use of DME or AE;Impaired balance (sitting and/or standing)   OT Treatment/Interventions: Self-care/ADL training;DME and/or AE instruction;Therapeutic activities;Balance training;Patient/family education      OT Goals(Current goals can be found in the care plan section)   Acute Rehab OT Goals Patient Stated Goal: go home OT Goal Formulation: With patient Time For Goal Achievement: 01/06/24 Potential to Achieve Goals: Good ADL Goals Pt Will Perform Grooming: with supervision;standing Pt Will Perform Upper Body Bathing: with set-up;with modified independence Pt Will Perform Lower Body Bathing: with supervision Pt Will Perform Upper Body Dressing: with set-up;with modified independence Pt Will Perform Lower Body Dressing: with supervision Pt Will Transfer to Toilet: with supervision;with modified independence;ambulating Pt Will Perform Toileting - Clothing Manipulation and hygiene: with supervision;with modified independence;sit to/from stand   OT Frequency:  Min 1X/week    Co-evaluation              AM-PAC OT "6  Clicks" Daily Activity     Outcome Measure Help from another person eating meals?: None Help from another person taking care of personal grooming?: A Little Help from another person toileting, which includes using toliet, bedpan, or urinal?: A Little Help from another person bathing (including washing, rinsing, drying)?: A Little Help from another person to put on and taking off regular upper body clothing?: A Little Help from another person to put on and taking off regular lower body clothing?: A Little 6 Click Score: 19   End of Session Equipment Utilized During Treatment: Gait belt;Rolling walker (2 wheels)  Activity Tolerance: Patient tolerated treatment well Patient left: in chair;with call bell/phone within reach;with nursing/sitter in room;Other (comment) (MD in to see pt)  OT Visit Diagnosis: Unsteadiness on feet (R26.81);Muscle weakness (generalized) (M62.81)                Time: 1610-9604 OT Time Calculation (min): 25 min Charges:  OT General Charges $OT Visit: 1 Visit OT Evaluation $OT Eval Moderate Complexity: 1 Mod OT Treatments $Therapeutic Activity: 8-22 mins   Galen Manila 12/23/2023, 1:13 PM

## 2023-12-23 NOTE — Progress Notes (Signed)
 PHARMACY - ANTICOAGULATION CONSULT NOTE  Pharmacy Consult for warfarin Indication: atrial fibrillation  Allergies  Allergen Reactions   Imdur [Isosorbide Nitrate] Other (See Comments)    Nausea and lightheaded     Patient Measurements: Height: 5\' 6"  (167.6 cm) Weight: 93.3 kg (205 lb 11 oz) IBW/kg (Calculated) : 63.8  Vital Signs: Temp: 97.7 F (36.5 C) (03/03 1325) Temp Source: Temporal (03/03 1108) BP: 152/71 (03/03 1435) Pulse Rate: 70 (03/03 1435)  Labs: Recent Labs    12/21/23 0849 12/22/23 0227 12/23/23 0731  HGB 11.3* 11.2* 11.7*  HCT 35.8* 35.0* 36.6*  PLT 143* 123* 177  LABPROT 37.6* 37.3* 31.2*  INR 3.8* 3.8* 3.0*  CREATININE 1.19 1.14 1.01    Estimated Creatinine Clearance: 47.8 mL/min (by C-G formula based on SCr of 1.01 mg/dL).   Medical History: Past Medical History:  Diagnosis Date   Acute upper GI bleed 05/18/2016   Aneurysm of thoracic aorta (HCC)    Ascending. 5.7 x 5.5cm - Bentall procedure   Aortic stenosis    Bioprosthetic AVR   Arthritis    Atrial fibrillation (HCC)    Documented on pacer interrogation 6/13   Complete heart block (HCC)    Status post pacemaker placement   Coronary atherosclerosis of native coronary artery    Multivessel - LIMA to LAD, SVG to OM, SVG to PDA   DJD (degenerative joint disease)    Esophageal dysmotility    Essential hypertension    Fibromyalgia    Gout    H/O arteriovenous malformation (AVM)    H/O hiatal hernia    Hemorrhoid    History of esophageal ulcer    History of skin cancer    Hx of adenomatous colonic polyps    Iron deficiency anemia due to chronic blood loss    Melena    Pacemaker-Medtronic    PMR (polymyalgia rheumatica) (HCC)    SIRS (systemic inflammatory response syndrome) (HCC) 01/16/2015    Medications:  Medications Prior to Admission  Medication Sig Dispense Refill Last Dose/Taking   acetaminophen (TYLENOL) 500 MG tablet Take 1,000 mg by mouth every 6 (six) hours as needed  for mild pain.   12/18/2023 Morning   allopurinol (ZYLOPRIM) 300 MG tablet Take 300 mg by mouth daily. Take 1  daily   12/18/2023 Morning   furosemide (LASIX) 20 MG tablet Take 20 mg by mouth every other day.   12/18/2023 Morning   metoprolol succinate (TOPROL-XL) 25 MG 24 hr tablet TAKE (2) TABLETS BY MOUTH DAILY. 180 tablet 1 12/18/2023 Morning   pantoprazole (PROTONIX) 40 MG tablet TAKE ONE TABLET BY MOUTH ONCE DAILY IN THE MORNING. 30 tablet 6 12/18/2023 Morning   pravastatin (PRAVACHOL) 20 MG tablet Take 20 mg by mouth every evening.   11 12/18/2023 Evening   sennosides-docusate sodium (SENOKOT-S) 8.6-50 MG tablet Take 1 tablet by mouth 2 (two) times daily.   12/18/2023 Evening   warfarin (COUMADIN) 5 MG tablet TAKE 1 TABLET BY MOUTH ONCE DAILY OR AS DIRECTED. (Patient taking differently: Take 5 mg by mouth daily. Or as directed) 90 tablet 1 12/18/2023 at  7:00 PM    Assessment: Pharmacy consulted to dose warfarin in patient with atrial fibrillation. PMH includes aortic stenosis s/p AVR, CAD, pacemaker, HTN, hx AVM, hx esophageal ulcers, acute UGIB, hemorrhoids.  Patient's home dose listed as 2.5 mg on Sun and 5 mg ROW (anti-coag visit note) with last dose 2/26. INR on admission is therapeutic at 2.7.   3/3 AM: Warfarin has been  held since 2/28, INR now at top end of therapeutic range at 3.0.  RN reports no signs of bleeding, less meal consumption given TEE today.   Notable DDIs: azithromycin was started 2/27, which can increase the anticoagulant effects of warfarin- now completed   Goal of Therapy:  INR 2-3 Monitor platelets by anticoagulation protocol: Yes  Monitoring: Date INR Warfarin Dose 2/27 2.7 5 mg 2/28 3.5 Held 3/1 3.8 Held 3/2 3.8 Held 3/3 3.0 Held    Plan:  HOLD warfarin today Daily INR, CBC Monitor for signs/symptoms of bleeding Monitor appetite and for DDIs  Jani Gravel, PharmD Clinical Pharmacist  12/23/2023 2:53 PM

## 2023-12-23 NOTE — Transfer of Care (Signed)
 Immediate Anesthesia Transfer of Care Note  Patient: Douglas [name]  Procedure(s) Performed: TRANSESOPHAGEAL ECHOCARDIOGRAM  Patient Location: PACU  Anesthesia Type:MAC  Level of Consciousness: sedated and drowsy  Airway & Oxygen Therapy: Patient Spontanous Breathing and Patient connected to nasal cannula oxygen  Post-op Assessment: Report given to RN and Post -op Vital signs reviewed and stable  Post vital signs: Reviewed and stable  Last Vitals:  Vitals Value Taken Time  BP    Temp 36.5 C 12/23/23 1325  Pulse 70 12/23/23 1325  Resp    SpO2      Last Pain:  Vitals:   12/23/23 1325  TempSrc:   PainSc: 0-No pain         Complications: No notable events documented.

## 2023-12-23 NOTE — H&P (View-Only) (Signed)
   Patient Name: Douglas [name] Date of Encounter: 12/23/2023 [facility] Cardiologist: Nona Dell, MD   Interval Summary  .    Comfortable, pleasant in chair.  He is on dysphagia diet.  Pleasant  Vital Signs .    Vitals:   12/22/23 2344 12/23/23 0418 12/23/23 0500 12/23/23 0755  BP: (!) 147/75 (!) 158/82  (!) 147/76  Pulse: 72 71  70  Resp: 17 18  18   Temp: 98.4 F (36.9 C) 97.6 F (36.4 C)  97.7 F (36.5 C)  TempSrc: Oral Oral  Oral  SpO2: 100% 98%  98%  Weight:   93.3 kg   Height:        Intake/Output Summary (Last 24 hours) at 12/23/2023 1019 Last data filed at 12/23/2023 0756 Gross per 24 hour  Intake 2001.5 ml  Output 3825 ml  Net -1823.5 ml      12/23/2023    5:00 AM 12/21/2023    4:29 AM 12/20/2023    5:00 AM  Last 3 Weights  Weight (lbs) 205 lb 11 oz 207 lb 14.3 oz 200 lb 12.8 oz  Weight (kg) 93.3 kg 94.3 kg 91.082 kg      Telemetry/ECG    No adverse arrhythmias, ventricular pacing noted- Personally Reviewed  Physical Exam .   GEN: No acute distress.   Neck: No JVD Cardiac: RRR, soft systolic murmur,no rubs, or gallops.  Respiratory: Clear to auscultation bilaterally. GI: Soft, nontender, non-distended  MS: No edema  Assessment & Plan .     89 year old with history of Bentall procedure/aortic valve replacement Edwards valve, complete heart block status post pacemaker with paroxysmal atrial fibrillation coronary disease status post CABG with hypertension seen originally on 12/21/2023 for the evaluation of bacteremia.  Came from [facility] with cough  Bacteremia with Enterobacter faecalis, sepsis - TEE today at noon-he is on a dysphagia diet however he has had endoscopy performed that shows no evidence of Zenker's diverticulum or esophageal stricture. -On ceftriaxone ampicillin  Acute systolic heart failure, BNP 800, echocardiogram EF 40% - He was redosed with Lasix yesterday.  Appears euvolemic currently.  Comfortable.  Left arm  ecchymosis -Noted  Acute kidney injury - Creatinine 1.35 on admission, baseline is 1.2  Pacemaker functioning well.  Paroxysmal atrial fibrillation - Continue with metoprolol as well as warfarin  For questions or updates, please contact [facility] Please consult [email or web address] for contact info under        Signed, Donato Schultz, MD

## 2023-12-23 NOTE — Progress Notes (Signed)
 SLP Cancellation Note  Patient Details Name: Douglas [name] MRN: [id number] DOB: 1927/11/05   Cancelled treatment:        NPO for procedure. Will continue efforts   Royce Macadamia 12/23/2023, 8:15 AM

## 2023-12-23 NOTE — Progress Notes (Addendum)
   Patient Name: Douglas [name] Date of Encounter: 12/23/2023 [facility] Cardiologist: Nona Dell, MD   Interval Summary  .    Comfortable, pleasant in chair.  He is on dysphagia diet.  Pleasant  Vital Signs .    Vitals:   12/22/23 2344 12/23/23 0418 12/23/23 0500 12/23/23 0755  BP: (!) 147/75 (!) 158/82  (!) 147/76  Pulse: 72 71  70  Resp: 17 18  18   Temp: 98.4 F (36.9 C) 97.6 F (36.4 C)  97.7 F (36.5 C)  TempSrc: Oral Oral  Oral  SpO2: 100% 98%  98%  Weight:   93.3 kg   Height:        Intake/Output Summary (Last 24 hours) at 12/23/2023 1019 Last data filed at 12/23/2023 0756 Gross per 24 hour  Intake 2001.5 ml  Output 3825 ml  Net -1823.5 ml      12/23/2023    5:00 AM 12/21/2023    4:29 AM 12/20/2023    5:00 AM  Last 3 Weights  Weight (lbs) 205 lb 11 oz 207 lb 14.3 oz 200 lb 12.8 oz  Weight (kg) 93.3 kg 94.3 kg 91.082 kg      Telemetry/ECG    No adverse arrhythmias, ventricular pacing noted- Personally Reviewed  Physical Exam .   GEN: No acute distress.   Neck: No JVD Cardiac: RRR, soft systolic murmur,no rubs, or gallops.  Respiratory: Clear to auscultation bilaterally. GI: Soft, nontender, non-distended  MS: No edema  Assessment & Plan .     89 year old with history of Bentall procedure/aortic valve replacement Edwards valve, complete heart block status post pacemaker with paroxysmal atrial fibrillation coronary disease status post CABG with hypertension seen originally on 12/21/2023 for the evaluation of bacteremia.  Came from [facility] with cough  Bacteremia with Enterobacter faecalis, sepsis - TEE today at noon-he is on a dysphagia diet however he has had endoscopy performed that shows no evidence of Zenker's diverticulum or esophageal stricture. -On ceftriaxone ampicillin  Acute systolic heart failure, BNP 800, echocardiogram EF 40% - He was redosed with Lasix yesterday.  Appears euvolemic currently.  Comfortable.  Left arm  ecchymosis -Noted  Acute kidney injury - Creatinine 1.35 on admission, baseline is 1.2  Pacemaker functioning well.  Paroxysmal atrial fibrillation - Continue with metoprolol as well as warfarin  For questions or updates, please contact [facility] Please consult [email or web address] for contact info under        Signed, Donato Schultz, MD

## 2023-12-23 NOTE — CV Procedure (Signed)
    TRANSESOPHAGEAL ECHOCARDIOGRAM   NAME:  Douglas [name]   MRN: [id number] DOB:  09/10/1928   ADMIT DATE: 12/19/2023  INDICATIONS: Bioprosthetic valve endocarditis, rule out  PROCEDURE:   Informed consent was obtained prior to the procedure. The risks, benefits and alternatives for the procedure were discussed and the patient comprehended these risks.  Risks include, but are not limited to, cough, sore throat, vomiting, nausea, somnolence, esophageal and stomach trauma or perforation, bleeding, low blood pressure, aspiration, pneumonia, infection, trauma to the teeth and death.    Procedural time out performed.   Patient received monitored anesthesia care under the supervision of Dr. Marcene Duos. Patient received a total of 354.89 mg propofol during the procedure.   The transesophageal probe was inserted in the esophagus after several attempts by two attendings. Multiple views were obtained.    COMPLICATIONS:    There were no immediate complications.  FINDINGS:  LEFT VENTRICLE: EF = 40-45% with global hypokinesis  RIGHT VENTRICLE: Enlarged size and grossly normal function.   LEFT ATRIUM: No thrombus/mass.  LEFT ATRIAL APPENDAGE: No thrombus/mass.   RIGHT ATRIUM: No thrombus/mass. RA and RV leads visualized without vegetations  AORTIC VALVE and ASCENDING AORTA:  S/P repair 01/25/2012 with 28 mm Bentall conduit and 23 mm pericardial Aortic valve. No evidence of aortic root abscess or aortic valve vegetation  MITRAL VALVE:    Normal structure. Mild-moderate regurgitation. No vegetation.  TRICUSPID VALVE: Normal structure. Moderate regurgitation. No vegetation.  PULMONIC VALVE: Grossly normal structure. Trivial regurgitation. No apparent vegetation.  INTERATRIAL SEPTUM: No PFO or ASD seen by color Doppler.  PERICARDIUM: No effusion noted.  DESCENDING AORTA: Moderate diffuse plaque seen   CONCLUSION: No evidence of endocarditis. Difficult TEE  intubation.   Jodelle Red, MD, PhD, [facility] [place]  [facility]  [place]  [facility] at [facility] at [facility] [address] [place], [place] [address] [phone]   1:20 PM

## 2023-12-23 NOTE — Interval H&P Note (Signed)
 History and Physical Interval Note:  12/23/2023 12:25 PM  Douglas [name]  has presented today for surgery, with the diagnosis of bacteremia.  The various methods of treatment have been discussed with the patient and family. After consideration of risks, benefits and other options for treatment, the patient has consented to  Procedure(s): TRANSESOPHAGEAL ECHOCARDIOGRAM (N/A) as a surgical intervention.  The patient's history has been reviewed, patient examined, no change in status, stable for surgery.  I have reviewed the patient's chart and labs.  Questions were answered to the patient's satisfaction.     [name]

## 2023-12-23 NOTE — Anesthesia Procedure Notes (Signed)
 Procedure Name: MAC Date/Time: 12/23/2023 12:35 PM  Performed by: Stanton Kidney, CRNAPre-anesthesia Checklist: Patient identified, Emergency Drugs available, Suction available and Patient being monitored Patient Re-evaluated:Patient Re-evaluated prior to induction Oxygen Delivery Method: Nasal cannula Dental Injury: Teeth and Oropharynx as per pre-operative assessment

## 2023-12-23 NOTE — Progress Notes (Signed)
 [facility]                                                                              Douglas [name], is a 89 y.o. male, DOB - 22-Oct-1928, WUJ:[id number] Admit date - 12/19/2023    Outpatient Primary MD for the patient is Carylon Perches, MD  LOS - 4  days  Chief Complaint  Patient presents with   Cough   Chills   Shortness of Breath       Brief summary   Patient is a 89 year old male with Afib on warfarin, s/p AVR, CAD, HTN, h/o AVM, h/o esophageal ulcer, h/o CHB with pacemaker in place, GERD who presented to [facility] ED by home complaining of cough, chills and SOB that started yesterday.  No chest pain, reported overall malaise and weakness.  No known sick contacts, reported that he is fairly active at his age and was working outside on a tractor a day before the admission.  He had flulike symptoms, headache, sore throat.    On arrival he was treated for sepsis due to pneumonia with leukocytosis and abnormal chest xray.  He had abnormal lung sounds and admission was requested for further management.  Workup revealed sepsis secondary to Enterobacter faecalis bacteremia.  Infectious disease was consulted, transferred to [facility] for cardiology/EP consultation.    Assessment & Plan  Principal Problem: Sepsis secondary to CAP (community acquired pneumonia), bacteremia, POA Acute respiratory failure with hypoxia: Required 3 L by [place] in ED.  Now on room air. -Had tachypnea, hypoxia, borderline BP, leukocytosis, lactic acid, chest x-ray with pneumonia. -Continue Zithromax, Rocephin  Active Problems: Sepsis secondary to Enterobacter faecalis bacteremia -ID consulted, continue ampicillin -Cardiology consulted-TEE today.  Acute HFmrEF: BNP 820.  TTE with LVEF of 40% (55 to 60% in 08/2023), ?DD  and RVSP of 52.  Diuresed with IV Lasix.  I&O incomplete.  Weight fluctuates.  Appears euvolemic on exam today.  No respiratory distress or oxygen requirement. -Cardiology  on board. -Strict intake and output, daily weight, renal functions and electrolytes  AKI ruled out: Cr 1.35 (was 1.23 on 11/7).  Recent Labs    08/29/23 1459 12/19/23 0531 12/20/23 0502 12/21/23 0849 12/22/23 0227 12/23/23 0731  BUN 27 29* 27* 24* 24* 21  CREATININE 1.23 1.35* 1.18 1.19 1.14 1.01  -Continue monitoring  Paroxysmal A-fib: Rate controlled on metoprolol.  On warfarin for anticoagulation.  INR 3.0. History of complete heart block/PPM in 2013: Noted -Continue metoprolol and warfarin -Optimize electrolytes  Hyperlipidemia -Continue pravastatin  Gout -Continue allopurinol  GERD -Continue PPI  Physical deconditioning -PT/OT  Pressure Injury documentation Pressure Injury 12/20/23 Perineum Bilateral Stage 1 -  Intact skin with non-blanchable redness of a localized area usually over a bony prominence. redness nonblancing skin intact redness (Active)  12/20/23 1838  Location: Perineum  Location Orientation: Bilateral  Staging: Stage 1 -  Intact skin with non-blanchable redness of a localized area usually over a bony prominence.  Wound Description (Comments): redness nonblancing skin intact redness  Present on Admission: Yes  Dressing Type Foam - Lift dressing to assess site every shift 12/22/23 2209  Obesity class II  Estimated body mass index is 33.2 kg/m as calculated from the following:   Height as of this encounter: 5\' 6"  (1.676 m).   Weight as of this encounter: 93.3 kg.  Code Status: Full code DVT Prophylaxis:     Level of Care: Level of care: Telemetry Cardiac Family Communication: None at bedside. Disposition Plan:      Remains inpatient appropriate: Pending TEE, IV antibiotics   Procedures:  2D echo  Consultants:   Cardiology Infectious disease  Antimicrobials:   Anti-infectives (From admission, onward)    Start     Dose/Rate Route Frequency Ordered Stop   12/20/23 2200  cefTRIAXone (ROCEPHIN) 2 g in sodium chloride 0.9 % 100 mL IVPB   Status:  Discontinued        2 g 200 mL/hr over 30 Minutes Intravenous Every 12 hours 12/20/23 0801 12/20/23 1019   12/20/23 1200  [MAR Hold]  ampicillin (OMNIPEN) 2 g in sodium chloride 0.9 % 100 mL IVPB        (MAR Hold since Mon 12/23/2023 at 1116.Hold Reason: Transfer to a Procedural area)   2 g 300 mL/hr over 20 Minutes Intravenous Every 6 hours 12/20/23 0801     12/20/23 1115  [MAR Hold]  cefTRIAXone (ROCEPHIN) 2 g in sodium chloride 0.9 % 100 mL IVPB        (MAR Hold since Mon 12/23/2023 at 1116.Hold Reason: Transfer to a Procedural area)   2 g 200 mL/hr over 30 Minutes Intravenous Every 12 hours 12/20/23 1019     12/20/23 1000  azithromycin (ZITHROMAX) tablet 500 mg        500 mg Oral Daily 12/19/23 0722 12/24/23 0959   12/20/23 0800  cefTRIAXone (ROCEPHIN) 2 g in sodium chloride 0.9 % 100 mL IVPB  Status:  Discontinued        2 g 200 mL/hr over 30 Minutes Intravenous Every 24 hours 12/19/23 0753 12/20/23 0801   12/19/23 0630  cefTRIAXone (ROCEPHIN) 1 g in sodium chloride 0.9 % 100 mL IVPB        1 g 200 mL/hr over 30 Minutes Intravenous  Once 12/19/23 0625 12/19/23 0715   12/19/23 0630  azithromycin (ZITHROMAX) 500 mg in sodium chloride 0.9 % 250 mL IVPB        500 mg 250 mL/hr over 60 Minutes Intravenous  Once 12/19/23 0625 12/19/23 0819          Medications  [MAR Hold] allopurinol  300 mg Oral Daily   azithromycin  500 mg Oral Daily   [MAR Hold] diclofenac Sodium  2 g Topical QID   [MAR Hold] guaiFENesin  600 mg Oral BID   [MAR Hold] metoprolol succinate  50 mg Oral Daily   [MAR Hold] pantoprazole  40 mg Oral QPM   [MAR Hold] pravastatin  20 mg Oral QPM   [MAR Hold] senna-docusate  1 tablet Oral BID   sodium chloride flush  3-10 mL Intravenous Q12H   [MAR Hold] Warfarin - Pharmacist Dosing Inpatient   Does not apply q1600      Subjective:   Douglas [name] was seen and examined today.  No major events overnight of this morning.  Sitting on bedside chair.  Has no  complaints.  Denies chest pain, shortness of breath or cough.  Aware of the plan for TEE today  Objective:   Vitals:   12/23/23 0418 12/23/23 0500 12/23/23 0755 12/23/23 1108  BP: (!) 158/82  (!) 147/76 (!) 167/82  Pulse:  71  70 74  Resp: 18  18 (!) 25  Temp: 97.6 F (36.4 C)  97.7 F (36.5 C) 98.5 F (36.9 C)  TempSrc: Oral  Oral Temporal  SpO2: 98%  98% 100%  Weight:  93.3 kg    Height:        Intake/Output Summary (Last 24 hours) at 12/23/2023 1248 Last data filed at 12/23/2023 1232 Gross per 24 hour  Intake 2151.5 ml  Output 2775 ml  Net -623.5 ml     Wt Readings from Last 3 Encounters:  12/23/23 93.3 kg  10/30/23 82.4 kg  10/22/23 86.2 kg     Exam GENERAL: No apparent distress.  Nontoxic. HEENT: MMM.  Vision and hearing grossly intact.  NECK: Supple.  No apparent JVD.  RESP:  No IWOB.  Fair aeration bilaterally. CVS:  RRR. Heart sounds normal.  ABD/GI/GU: BS+. Abd soft, NTND.  MSK/EXT:   No apparent deformity. Moves extremities.  Trace BLE edema. SKIN: no apparent skin lesion or wound NEURO: Awake and alert. Oriented appropriately.  No apparent focal neuro deficit. PSYCH: Calm. Normal affect.     Data Reviewed:  I have personally reviewed following labs    CBC Lab Results  Component Value Date   WBC 8.5 12/23/2023   RBC 3.93 (L) 12/23/2023   HGB 11.7 (L) 12/23/2023   HCT 36.6 (L) 12/23/2023   MCV 93.1 12/23/2023   MCH 29.8 12/23/2023   PLT 177 12/23/2023   MCHC 32.0 12/23/2023   RDW 14.9 12/23/2023   LYMPHSABS 1.0 12/21/2023   MONOABS 0.9 12/21/2023   EOSABS 0.2 12/21/2023   BASOSABS 0.1 12/21/2023     Last metabolic panel Lab Results  Component Value Date   NA 136 12/23/2023   K 3.5 12/23/2023   CL 99 12/23/2023   CO2 24 12/23/2023   BUN 21 12/23/2023   CREATININE 1.01 12/23/2023   GLUCOSE 99 12/23/2023   GFRNONAA >60 12/23/2023   GFRAA >60 08/29/2018   CALCIUM 8.5 (L) 12/23/2023   PROT 7.1 08/29/2023   ALBUMIN 4.1 08/29/2023    LABGLOB 3.0 08/29/2023   AGRATIO 1.1 10/03/2016   BILITOT 0.6 08/29/2023   ALKPHOS 155 (H) 08/29/2023   AST 22 08/29/2023   ALT 13 08/29/2023   ANIONGAP 13 12/23/2023    CBG (last 3)  No results for input(s): "GLUCAP" in the last 72 hours.    Coagulation Profile: Recent Labs  Lab 12/19/23 0531 12/20/23 0502 12/21/23 0849 12/22/23 0227 12/23/23 0731  INR 2.7* 3.5* 3.8* 3.8* 3.0*     Radiology Studies: I have personally reviewed the imaging studies  No results found.   35 minutes with more than 50% spent in reviewing records, counseling patient/family and coordinating care.   Almon Hercules M.D. [facility] 12/23/2023, 12:48 PM  Available via Epic secure chat 7am-7pm After 7 pm, please refer to night coverage provider listed on amion.

## 2023-12-23 NOTE — Progress Notes (Signed)
  Echocardiogram Echocardiogram Transesophageal has been performed.  Douglas [name] 12/23/2023, 1:47 PM

## 2023-12-23 NOTE — Anesthesia Preprocedure Evaluation (Addendum)
 Anesthesia Evaluation  Patient identified by MRN, date of birth, ID band Patient awake    Reviewed: Allergy & Precautions, NPO status , Patient's Chart, lab work & pertinent test results, reviewed documented beta blocker date and time   Airway Mallampati: II  TM Distance: >3 FB     Dental no notable dental hx. (+) Dental Advisory Given, Teeth Intact, Caps, Missing,    Pulmonary pneumonia, resolved   Pulmonary exam normal breath sounds clear to auscultation       Cardiovascular hypertension, Pt. on medications and Pt. on home beta blockers + CAD, + CABG and +CHF  Normal cardiovascular exam+ dysrhythmias Atrial Fibrillation + pacemaker  Rhythm:Regular Rate:Normal  S/P CABG + Bentall procedure   Neuro/Psych  Neuromuscular disease  negative psych ROS   GI/Hepatic hiatal hernia,,,  Endo/Other  Obesity HLD Gout  Renal/GU Renal disease  negative genitourinary   Musculoskeletal  (+) Arthritis , Osteoarthritis,  Fibromyalgia -  Abdominal  (+) + obese  Peds  Hematology  (+) Blood dyscrasia, anemia Bacteremia Coumadin therapy- last dose yesterday PT 31.2  INR 3.0   Anesthesia Other Findings   Reproductive/Obstetrics                             Anesthesia Physical Anesthesia Plan  ASA: 3  Anesthesia Plan: General   Post-op Pain Management: Minimal or no pain anticipated   Induction: Intravenous  PONV Risk Score and Plan: Treatment may vary due to age or medical condition and Propofol infusion  Airway Management Planned: Natural Airway and Nasal Cannula  Additional Equipment: None  Intra-op Plan:   Post-operative Plan:   Informed Consent: I have reviewed the patients History and Physical, chart, labs and discussed the procedure including the risks, benefits and alternatives for the proposed anesthesia with the patient or authorized representative who has indicated his/her understanding  and acceptance.     Dental advisory given  Plan Discussed with: CRNA and Anesthesiologist  Anesthesia Plan Comments:         Anesthesia Quick Evaluation

## 2023-12-24 ENCOUNTER — Telehealth: Payer: Self-pay | Admitting: *Deleted

## 2023-12-24 ENCOUNTER — Telehealth (HOSPITAL_COMMUNITY): Payer: Self-pay

## 2023-12-24 ENCOUNTER — Other Ambulatory Visit (HOSPITAL_COMMUNITY): Payer: Self-pay

## 2023-12-24 ENCOUNTER — Telehealth (HOSPITAL_COMMUNITY): Payer: Self-pay | Admitting: Pharmacy Technician

## 2023-12-24 ENCOUNTER — Encounter (HOSPITAL_COMMUNITY): Payer: Self-pay | Admitting: Oncology

## 2023-12-24 DIAGNOSIS — Z952 Presence of prosthetic heart valve: Secondary | ICD-10-CM | POA: Diagnosis not present

## 2023-12-24 DIAGNOSIS — N179 Acute kidney failure, unspecified: Secondary | ICD-10-CM | POA: Diagnosis not present

## 2023-12-24 DIAGNOSIS — I330 Acute and subacute infective endocarditis: Secondary | ICD-10-CM | POA: Diagnosis not present

## 2023-12-24 DIAGNOSIS — J9601 Acute respiratory failure with hypoxia: Secondary | ICD-10-CM

## 2023-12-24 DIAGNOSIS — I5021 Acute systolic (congestive) heart failure: Secondary | ICD-10-CM | POA: Diagnosis not present

## 2023-12-24 DIAGNOSIS — R7881 Bacteremia: Secondary | ICD-10-CM | POA: Diagnosis not present

## 2023-12-24 DIAGNOSIS — J189 Pneumonia, unspecified organism: Secondary | ICD-10-CM | POA: Diagnosis not present

## 2023-12-24 LAB — BASIC METABOLIC PANEL
Anion gap: 6 (ref 5–15)
BUN: 22 mg/dL (ref 8–23)
CO2: 25 mmol/L (ref 22–32)
Calcium: 8.2 mg/dL — ABNORMAL LOW (ref 8.9–10.3)
Chloride: 108 mmol/L (ref 98–111)
Creatinine, Ser: 1.04 mg/dL (ref 0.61–1.24)
GFR, Estimated: >60 mL/min (ref >60)
Glucose, Bld: 123 mg/dL — ABNORMAL HIGH (ref 70–99)
Potassium: 4.5 mmol/L (ref 3.5–5.1)
Sodium: 139 mmol/L (ref 135–145)

## 2023-12-24 LAB — CULTURE, BLOOD (ROUTINE X 2)
Culture: NO GROWTH
Special Requests: ADEQUATE

## 2023-12-24 LAB — CBC
HCT: 33.7 % — ABNORMAL LOW (ref 39.0–52.0)
Hemoglobin: 10.9 g/dL — ABNORMAL LOW (ref 13.0–17.0)
MCH: 30.4 pg (ref 26.0–34.0)
MCHC: 32.3 g/dL (ref 30.0–36.0)
MCV: 94.1 fL (ref 80.0–100.0)
Platelets: 184 10*3/uL (ref 150–400)
RBC: 3.58 MIL/uL — ABNORMAL LOW (ref 4.22–5.81)
RDW: 14.9 % (ref 11.5–15.5)
WBC: 8.3 10*3/uL (ref 4.0–10.5)
nRBC: 0 % (ref 0.0–0.2)

## 2023-12-24 LAB — MAGNESIUM: Magnesium: 1.6 mg/dL — ABNORMAL LOW (ref 1.7–2.4)

## 2023-12-24 LAB — PROTIME-INR
INR: 3.3 — ABNORMAL HIGH (ref 0.8–1.2)
Prothrombin Time: 34.2 s — ABNORMAL HIGH (ref 11.4–15.2)

## 2023-12-24 MED ORDER — DICLOFENAC SODIUM 1 % EX GEL
2.0000 g | Freq: Four times a day (QID) | CUTANEOUS | 2 refills | Status: DC
  Filled 2023-12-24: qty 50, 30d supply, fill #0

## 2023-12-24 MED ORDER — MAGNESIUM SULFATE 4 GM/100ML IV SOLN
4.0000 g | Freq: Once | INTRAVENOUS | Status: AC
  Administered 2023-12-24: 4 g via INTRAVENOUS
  Filled 2023-12-24: qty 100

## 2023-12-24 MED ORDER — FUROSEMIDE 20 MG PO TABS
20.0000 mg | ORAL_TABLET | ORAL | Status: DC
  Administered 2023-12-24: 20 mg via ORAL
  Filled 2023-12-24: qty 1

## 2023-12-24 MED ORDER — LINEZOLID 600 MG PO TABS
600.0000 mg | ORAL_TABLET | Freq: Two times a day (BID) | ORAL | 0 refills | Status: AC
  Filled 2023-12-24: qty 26, 13d supply, fill #0

## 2023-12-24 NOTE — Telephone Encounter (Signed)
 Pharmacy Patient Advocate Encounter  Received notification from [facility] that Prior Authorization for Linezolid 600MG  tablets  has been APPROVED from 12/24/2023 to 03/25/2024. Ran test claim, Copay is $138.12. This test claim was processed through [facility]- copay amounts may vary at other pharmacies due to pharmacy/plan contracts, or as the patient moves through the different stages of their insurance plan.   PA #/Case ID/Reference #: [id number]

## 2023-12-24 NOTE — Telephone Encounter (Signed)
 Pharmacy Patient Advocate Encounter  Insurance verification completed.    The patient is insured through [facility]. Patient has Medicare and is not eligible for a copay card, but may be able to apply for patient assistance or Medicare RX Payment Plan (Patient Must reach out to their plan, if eligible for payment plan), if available.    Ran test claim for Linezolid 600 mg requires Prior Authorization   This test claim was processed through [employer]- copay amounts may vary at other pharmacies due to [employer], or as the patient moves through the different stages of their insurance plan.

## 2023-12-24 NOTE — TOC Transition Note (Signed)
 Transition of Care [facility]) - Discharge Note   Patient Details  Name: Douglas [name] MRN: [id number] Date of Birth: March 28, 1928  [facility] of [facility] CM/SW Contact:  Leone Haven, RN Phone Number: 12/24/2023, 12:49 PM   Clinical Narrative:    For dc today, he is set up with [facility] for HHPT, HHOT, daughter will transport home per patient.     Barriers to Discharge: Continued Medical Work up   Patient Goals and CMS Choice            Discharge Placement                       Discharge Plan and Services Additional resources added to the After Visit Summary for                                       Social Drivers of Health (SDOH) Interventions SDOH Screenings   Food Insecurity: No Food Insecurity (12/19/2023)  Housing: Low Risk  (12/19/2023)  Transportation Needs: No Transportation Needs (12/19/2023)  Utilities: Not At Risk (12/19/2023)  Social Connections: Moderately Isolated (12/19/2023)  Tobacco Use: Low Risk  (12/23/2023)     Readmission Risk Interventions    12/19/2023    3:34 PM  Readmission Risk Prevention Plan  Transportation Screening Complete  Home Care Screening Complete  Medication Review (RN CM) Complete

## 2023-12-24 NOTE — Telephone Encounter (Signed)
 Pharmacy Patient Advocate Encounter   Received notification that prior authorization for Linezolid 600MG  tablets is required/requested.   Insurance verification completed.   The patient is insured through [facility] .   Per test claim: PA required; PA submitted to above mentioned insurance via CoverMyMeds Key/confirmation #/EOC [id number] Status is pending

## 2023-12-24 NOTE — Progress Notes (Signed)
 [facility]  Date of Admission:  12/19/2023    Lines:  ppm   Abx: 2/28-c ampicillin 2/27-c ceftriaxone    2/27-3/1 azith   Assessment: 89 yo male with pacer for complete heart block, afib on coumadin, hx Bentall procedure/AVR, cad s/p cabg, transferred from [place] on 3/1 (admitted there 2/27) for e faecalis bacteremia                                                                 # E faecalis bacteremia # hx AVR (edward valve) # need to r/o PV endocarditis as well as CIED infection  Acute onset febrile syndrome admitted 2/27 and found to have bcx e faecalis No clear source No obvious metastastic focus Need repeat bcx and tee   # Acute hypoxic respiratory failure  # CAP - likely 2/2 above  - tte does show ef 40% and I suspect some component of chf as well -- cards following - 2/28 strep pneumo ag negative  - Fu legionella ag - abtx as above, can complete 3 days of azithromycin     ---------------------- 12/24/23 id assessment Tee no sign of device/valve endocarditis  3/03 bcx ngtd  No abx side effect No sign of metastatic focus of involvement   Discussed case with cardiology. Low burden infection and negative tee so will do 2 weeks abx treatment and reevaluate 2 weeks after finishing treatment   Plan: -can change abx to linezolid 600 mg po bid; please give 2 weeks from 12/23/23 until 01/05/2024 -will follow 3/3 bcx -id clinic f/u 01/21/2024 with me at [facility] -universal isolation precaution -id will sign off -ok to discharge from id standpoint when cleared with cards/primary team    Principal Problem:   CAP (community acquired pneumonia) Active Problems:   Essential hypertension, benign   Coronary atherosclerosis of native coronary artery   Atrial fibrillation (HCC)   Pacemaker-Medtronic   Aortic valve replaced   Chronic anticoagulation - Coumadin, CHADS2VASC=5   History of esophageal ulcer   H/O arteriovenous  malformation (AVM)   Oropharyngeal dysphagia   AKI (acute kidney injury) (HCC)   Leukocytosis   Lactic acidosis   Generalized weakness   Hyperglycemia   Bacteremia   Acute systolic (congestive) heart failure (HCC)   Pressure injury of skin   Acute bacterial endocarditis   Allergies  Allergen Reactions   Imdur [Isosorbide Nitrate] Other (See Comments)    Nausea and lightheaded     Scheduled Meds:  allopurinol  300 mg Oral Daily   diclofenac Sodium  2 g Topical QID   furosemide  20 mg Oral QODAY   metoprolol succinate  50 mg Oral Daily   pantoprazole  40 mg Oral QPM   pravastatin  20 mg Oral QPM   senna-docusate  1 tablet Oral BID   Warfarin - Pharmacist Dosing Inpatient   Does not apply q1600   Continuous Infusions:  ampicillin (OMNIPEN) IV 2 g (12/24/23 0612)   magnesium sulfate bolus IVPB 4 g (12/24/23 1022)   PRN Meds:.acetaminophen **OR** acetaminophen, bisacodyl, dextromethorphan, fentaNYL (SUBLIMAZE) injection, ipratropium-albuterol, ondansetron **OR** ondansetron (ZOFRAN) IV, oxyCODONE, traZODone   SUBJECTIVE: Doing well No headache, visual change, rash No fever, chill No n/v/diarrhea Tee negative  Review of Systems: ROS All other ROS was negative, except mentioned above     OBJECTIVE: Vitals:   12/23/23 2340 12/24/23 0415 12/24/23 0425 12/24/23 1129  BP: (!) 123/57 137/71  (!) 148/73  Pulse: 69 85  74  Resp: 18 18  19   Temp: (!) 97.5 F (36.4 C) 97.6 F (36.4 C)  97.9 F (36.6 C)  TempSrc: Oral Oral  Oral  SpO2: 96% 93%  100%  Weight:   93.4 kg   Height:       Body mass index is 33.23 kg/m.  Physical Exam General/constitutional: no distress, pleasant HEENT: Normocephalic, PER, Conj Clear, EOMI, Oropharynx clear Neck supple CV: rrr no mrg Lungs: clear to auscultation, normal respiratory effort Abd: Soft, Nontender Ext: no edema Skin: No Rash Neuro: nonfocal MSK: no peripheral joint swelling/tenderness/warmth; back spines  nontender     Lab Results Lab Results  Component Value Date   WBC 8.3 12/24/2023   HGB 10.9 (L) 12/24/2023   HCT 33.7 (L) 12/24/2023   MCV 94.1 12/24/2023   PLT 184 12/24/2023    Lab Results  Component Value Date   CREATININE 1.04 12/24/2023   BUN 22 12/24/2023   NA 139 12/24/2023   K 4.5 12/24/2023   CL 108 12/24/2023   CO2 25 12/24/2023    Lab Results  Component Value Date   ALT 13 08/29/2023   AST 22 08/29/2023   ALKPHOS 155 (H) 08/29/2023   BILITOT 0.6 08/29/2023      Microbiology: Recent Results (from the past 240 hours)  Resp panel by RT-PCR (RSV, Flu A&B, Covid) Anterior Nasal Swab     Status: None   Collection Time: 12/19/23  5:31 AM   Specimen: Anterior Nasal Swab  Result Value Ref Range Status   SARS Coronavirus 2 by RT PCR NEGATIVE NEGATIVE Final    Comment: (NOTE) SARS-CoV-2 target nucleic acids are NOT DETECTED.  The SARS-CoV-2 RNA is generally detectable in upper respiratory specimens during the acute phase of infection. The lowest concentration of SARS-CoV-2 viral copies this assay can detect is 138 copies/mL. A negative result does not preclude SARS-Cov-2 infection and should not be used as the sole basis for treatment or other patient management decisions. A negative result may occur with  improper specimen collection/handling, submission of specimen other than nasopharyngeal swab, presence of viral mutation(s) within the areas targeted by this assay, and inadequate number of viral copies(<138 copies/mL). A negative result must be combined with clinical observations, patient history, and epidemiological information. The expected result is Negative.  Fact Sheet for Patients:  BloggerCourse.com  Fact Sheet for Healthcare Providers:  SeriousBroker.it  This test is no t yet approved or cleared by the [place] FDA and  has been authorized for detection and/or diagnosis of SARS-CoV-2 by FDA  under an Emergency Use Authorization (EUA). This EUA will remain  in effect (meaning this test can be used) for the duration of the COVID-19 declaration under Section 564(b)(1) of the Act, 21 U.S.C.section 360bbb-3(b)(1), unless the authorization is terminated  or revoked sooner.       Influenza A by PCR NEGATIVE NEGATIVE Final   Influenza B by PCR NEGATIVE NEGATIVE Final    Comment: (NOTE) The Xpert Xpress SARS-CoV-2/FLU/RSV plus assay is intended as an aid in the diagnosis of influenza from Nasopharyngeal swab specimens and should not be used as a sole basis for treatment. Nasal washings and aspirates are unacceptable for Xpert Xpress SARS-CoV-2/FLU/RSV testing.  Fact Sheet for Patients: BloggerCourse.com  Fact Sheet for Healthcare Providers: SeriousBroker.it  This test is not yet approved or cleared by the [place] FDA and has been authorized for detection and/or diagnosis of SARS-CoV-2 by FDA under an Emergency Use Authorization (EUA). This EUA will remain in effect (meaning this test can be used) for the duration of the COVID-19 declaration under Section 564(b)(1) of the Act, 21 U.S.C. section 360bbb-3(b)(1), unless the authorization is terminated or revoked.     Resp Syncytial Virus by PCR NEGATIVE NEGATIVE Final    Comment: (NOTE) Fact Sheet for Patients: BloggerCourse.com  Fact Sheet for Healthcare Providers: SeriousBroker.it  This test is not yet approved or cleared by the [place] FDA and has been authorized for detection and/or diagnosis of SARS-CoV-2 by FDA under an Emergency Use Authorization (EUA). This EUA will remain in effect (meaning this test can be used) for the duration of the COVID-19 declaration under Section 564(b)(1) of the Act, 21 U.S.C. section 360bbb-3(b)(1), unless the authorization is terminated or revoked.  Performed at [facility], [address] [place], [place] [address]   Blood Culture (routine x 2)     Status: None   Collection Time: 12/19/23  5:31 AM   Specimen: BLOOD  Result Value Ref Range Status   Specimen Description BLOOD RIGHT ANTECUBITAL  Final   Special Requests   Final    BOTTLES DRAWN AEROBIC AND ANAEROBIC Blood Culture adequate volume   Culture   Final    NO GROWTH 5 DAYS Performed at [facility], [address], [place], [place] [address]    Report Status 12/24/2023 FINAL  Final  Blood Culture (routine x 2)     Status: Abnormal   Collection Time: 12/19/23  6:06 AM   Specimen: BLOOD  Result Value Ref Range Status   Specimen Description   Final    BLOOD BLOOD RIGHT HAND Performed at [facility], [address] [place], [place] [address]    Special Requests   Final    BOTTLES DRAWN AEROBIC AND ANAEROBIC Blood Culture adequate volume Performed at [facility], [address], [place], [place] [address]    Culture  Setup Time   Final    GRAM POSITIVE COCCI IN BOTH AEROBIC AND ANAEROBIC BOTTLES Gram Stain Report Called to,Read Back By and Verified With: Kathee Polite [phone], VIRAY,J Organism ID to follow CRITICAL RESULT CALLED TO, READ BACK BY AND VERIFIED WITH: P BINES,RN@0028  12/20/23 MK Performed at [facility], [address] [place], [place] [address]    Culture ENTEROCOCCUS FAECALIS (A)  Final   Report Status 12/22/2023 FINAL  Final   Organism ID, Bacteria ENTEROCOCCUS FAECALIS  Final      Susceptibility   Enterococcus faecalis - MIC*    AMPICILLIN <=2 SENSITIVE Sensitive     VANCOMYCIN 1 SENSITIVE Sensitive     GENTAMICIN SYNERGY RESISTANT Resistant     * ENTEROCOCCUS FAECALIS  Blood Culture ID Panel (Reflexed)     Status: Abnormal   Collection Time: 12/19/23  6:06 AM  Result Value Ref Range Status   Enterococcus faecalis DETECTED (A) NOT DETECTED Final    Comment: CRITICAL RESULT CALLED TO, READ BACK BY AND VERIFIED WITH: P BINES,RN@0027  12/20/23 MK     Enterococcus Faecium NOT DETECTED NOT DETECTED Final   Listeria monocytogenes NOT DETECTED NOT DETECTED Final   Staphylococcus species NOT DETECTED NOT DETECTED Final   Staphylococcus aureus (BCID) NOT DETECTED NOT DETECTED Final   Staphylococcus epidermidis NOT DETECTED NOT DETECTED Final  Staphylococcus lugdunensis NOT DETECTED NOT DETECTED Final   Streptococcus species NOT DETECTED NOT DETECTED Final   Streptococcus agalactiae NOT DETECTED NOT DETECTED Final   Streptococcus pneumoniae NOT DETECTED NOT DETECTED Final   Streptococcus pyogenes NOT DETECTED NOT DETECTED Final   A.calcoaceticus-baumannii NOT DETECTED NOT DETECTED Final   Bacteroides fragilis NOT DETECTED NOT DETECTED Final   Enterobacterales NOT DETECTED NOT DETECTED Final   Enterobacter cloacae complex NOT DETECTED NOT DETECTED Final   Escherichia coli NOT DETECTED NOT DETECTED Final   Klebsiella aerogenes NOT DETECTED NOT DETECTED Final   Klebsiella oxytoca NOT DETECTED NOT DETECTED Final   Klebsiella pneumoniae NOT DETECTED NOT DETECTED Final   Proteus species NOT DETECTED NOT DETECTED Final   Salmonella species NOT DETECTED NOT DETECTED Final   Serratia marcescens NOT DETECTED NOT DETECTED Final   Haemophilus influenzae NOT DETECTED NOT DETECTED Final   Neisseria meningitidis NOT DETECTED NOT DETECTED Final   Pseudomonas aeruginosa NOT DETECTED NOT DETECTED Final   Stenotrophomonas maltophilia NOT DETECTED NOT DETECTED Final   Candida albicans NOT DETECTED NOT DETECTED Final   Candida auris NOT DETECTED NOT DETECTED Final   Candida glabrata NOT DETECTED NOT DETECTED Final   Candida krusei NOT DETECTED NOT DETECTED Final   Candida parapsilosis NOT DETECTED NOT DETECTED Final   Candida tropicalis NOT DETECTED NOT DETECTED Final   Cryptococcus neoformans/gattii NOT DETECTED NOT DETECTED Final   Vancomycin resistance NOT DETECTED NOT DETECTED Final    Comment: Performed at [facility], [address],  [place], [place] [address]  Culture, blood (Routine X 2) w Reflex to ID Panel     Status: None (Preliminary result)   Collection Time: 12/23/23  7:31 AM   Specimen: BLOOD RIGHT HAND  Result Value Ref Range Status   Specimen Description BLOOD RIGHT HAND  Final   Special Requests   Final    BOTTLES DRAWN AEROBIC AND ANAEROBIC Blood Culture results may not be optimal due to an inadequate volume of blood received in culture bottles   Culture   Final    NO GROWTH 1 DAY Performed at [facility], [address], [place], [place] [address]    Report Status PENDING  Incomplete  Culture, blood (Routine X 2) w Reflex to ID Panel     Status: None (Preliminary result)   Collection Time: 12/23/23  7:31 AM   Specimen: BLOOD  Result Value Ref Range Status   Specimen Description BLOOD LEFT ANTECUBITAL  Final   Special Requests   Final    BOTTLES DRAWN AEROBIC AND ANAEROBIC Blood Culture results may not be optimal due to an inadequate volume of blood received in culture bottles   Culture   Final    NO GROWTH 1 DAY Performed at [facility], [address] [place], [place] [address]    Report Status PENDING  Incomplete     Serology:   Imaging: If present, new imagings (plain films, ct scans, and mri) have been personally visualized and interpreted; radiology reports have been reviewed. Decision making incorporated into the Impression / Recommendations.  3/3 tee No vegetation on valve/cardiac device  Raymondo Band, MD [facility] for [facility]ious Disease [facility] [phone] pager    12/24/2023, 11:51 AM

## 2023-12-24 NOTE — Progress Notes (Signed)
   Patient Name: Douglas [name] Date of Encounter: 12/24/2023 [facility] Cardiologist: Nona Dell, MD   Interval Summary  .    Comfortable.  Throat hurt yesterday after procedure.  Swallowing and eating well.  Comfortable today.  Vital Signs .    Vitals:   12/23/23 1932 12/23/23 2340 12/24/23 0415 12/24/23 0425  BP: (!) 148/77 (!) 123/57 137/71   Pulse: 71 69 85   Resp: 19 18 18    Temp: (!) 97.5 F (36.4 C) (!) 97.5 F (36.4 C) 97.6 F (36.4 C)   TempSrc: Oral Oral Oral   SpO2: 97% 96% 93%   Weight:    93.4 kg  Height:        Intake/Output Summary (Last 24 hours) at 12/24/2023 0909 Last data filed at 12/24/2023 0847 Gross per 24 hour  Intake 670 ml  Output 300 ml  Net 370 ml      12/24/2023    4:25 AM 12/23/2023    5:00 AM 12/21/2023    4:29 AM  Last 3 Weights  Weight (lbs) 205 lb 14.4 oz 205 lb 11 oz 207 lb 14.3 oz  Weight (kg) 93.396 kg 93.3 kg 94.3 kg      Telemetry/ECG    Ventricular pacing dependent- Personally Reviewed  Physical Exam .   GEN: No acute distress.   Neck: No JVD Cardiac: RRR, no murmurs, rubs, or gallops.  Respiratory: Clear to auscultation bilaterally. GI: Soft, nontender, non-distended  MS: No edema  Assessment & Plan .     89 year old with Bentall procedure aortic valve replacement with Edwards bioprosthetic valve with complete heart block status post pacemaker placement with paroxysmal atrial fibrillation coronary disease post CABG hypertension with bacteremia, Enterococcus faecalis sepsis  Bacteremia with Enterococcus faecalis sepsis - Transesophageal echocardiogram performed.  No evidence of endocarditis no vegetation.  EF 40 to 45%.  Has a Medtronic dual-chamber pacemaker implanted in 2013 programmed VVIR 70 known to be pacer dependent. EP saw him yesterday 3/3.  Recommendation is to continue ceftriaxone and ampicillin.  Treat bacteremia per ID.  Acute systolic heart failure BNP 800 EF 40% - Appears euvolemic,  comfortable. -Currently on metoprolol XL 50 mg a day -Okay to resume Lasix 20 mg every other day as he was taking at home.  Hyperlipidemia - On pravastatin 20 mg a day  Paroxysmal atrial fibrillation -Metoprolol for rate control  Chronic anticoagulation - On warfarin as directed by pharmacy team.  Creatinine back to baseline 1.04-  1.2.  We will go ahead and sign off.  Please let us know if we can be of further assistance.  For questions or updates, please contact [facility] Please consult [email or web address] for contact info under        Signed, Donato Schultz, MD

## 2023-12-24 NOTE — Care Management Important Message (Signed)
 Important Message  Patient Details  Name: Douglas [name] MRN: [id number] Date of Birth: 19-Sep-1928   Important Message Given:  Yes - Medicare IM     Renie Ora 12/24/2023, 11:38 AM

## 2023-12-24 NOTE — TOC Progression Note (Addendum)
 Transition of Care [facility]) - Progression Note    Patient Details  Name: Douglas [name] MRN: [id number] Date of Birth: 1928-03-11  Transition of Care [facility]) CM/SW Contact  Leone Haven, RN Phone Number: 12/24/2023, 12:36 PM  Clinical Narrative:    NCM spoke with patient , offered choice, he states his wife had [facility] before and he would like to have them.  NCM made referral to [facility] with [facility]. She is checking to make sure he is in the area they can cover.  He states he does not need a rolliing walker, he has one that his wife had.  Also he states his daughter will transport him home at dc. Per Haywood Lasso with [facility], they are able to take referral.  Soc will begin 24 to 48 hrs post dc.      Barriers to Discharge: Continued Medical Work up  Expected Discharge Plan and Services                                               Social Determinants of Health (SDOH) Interventions SDOH Screenings   Food Insecurity: No Food Insecurity (12/19/2023)  Housing: Low Risk  (12/19/2023)  Transportation Needs: No Transportation Needs (12/19/2023)  Utilities: Not At Risk (12/19/2023)  Social Connections: Moderately Isolated (12/19/2023)  Tobacco Use: Low Risk  (12/23/2023)    Readmission Risk Interventions    12/19/2023    3:34 PM  Readmission Risk Prevention Plan  Transportation Screening Complete  Home Care Screening Complete  Medication Review (RN CM) Complete

## 2023-12-24 NOTE — Anesthesia Postprocedure Evaluation (Signed)
 Anesthesia Post Note  Patient: Douglas [name]  Procedure(s) Performed: TRANSESOPHAGEAL ECHOCARDIOGRAM     Patient location during evaluation: PACU Anesthesia Type: General Level of consciousness: awake and alert Pain management: pain level controlled Vital Signs Assessment: post-procedure vital signs reviewed and stable Respiratory status: spontaneous breathing, nonlabored ventilation, respiratory function stable and patient connected to nasal cannula oxygen Cardiovascular status: stable and blood pressure returned to baseline Postop Assessment: no apparent nausea or vomiting Anesthetic complications: no   No notable events documented.  Last Vitals:  Vitals:   12/24/23 0415 12/24/23 1129  BP: 137/71 (!) 148/73  Pulse: 85 74  Resp: 18 19  Temp: 36.4 C 36.6 C  SpO2: 93% 100%    Last Pain:  Vitals:   12/24/23 1129  TempSrc: Oral  PainSc:                  Kennieth Rad

## 2023-12-24 NOTE — Consult Note (Signed)
 [facility] Liaison Consult Note   12/24/2023  Douglas [name] May 06, 1928 [id number]  Insurance: Medicare ACO REACH  Primary Care Provider: Carylon Perches, MD, this provider is listed for the transition of care follow up appointments  and [facility] calls   [facility] Liaison met patient at bedside at [facility]. Explained reason for follow up.  Patient was screened for potential medical issues/follow up needs.  He states he believes that the medications issue is settled and he is supposed to get it before leaving.  He states he is not sure when he is to be discharged.  His daughter is coming to visit from [place], as she has been with his wife.     The patient was screened for 5-day  hospitalization with noted medium risk score for unplanned readmission risk 1 ED and 1 hospital admissions in 6 months.  The patient was assessed for potential [facility] Coordination service needs for post hospital transition for care coordination. Review of patient's electronic medical record reveals patient is admitted with CAP with sepsis..   Plan: [facility] Liaison will continue to follow progress and disposition to asess for post hospital community care coordination/management needs.  Referral request for community care coordination: Anticipate the [facility] team for post hospital follow up.  Patient states no other needs.   [facility] Community Care, Population Health does not replace or interfere with any arrangements made by the Inpatient Transition of Care team.   For questions contact:   Charlesetta Shanks, RN, BSN, CCM [place]  [facility] Liaison Direct Dial: [phone] or secure chat Email: [email or web address]

## 2023-12-24 NOTE — Progress Notes (Signed)
 Speech Language Pathology Treatment: Dysphagia  Patient Details Name: Douglas [name] MRN: [id number] DOB: 1928-07-19 Today's Date: 12/24/2023 Time: 4782-9562 SLP Time Calculation (min) (ACUTE ONLY): 18 min  Assessment / Plan / Recommendation Clinical Impression  Pt reported fair tolerance of Dys 3 diet and thin liquids without overt concerns for aspiration with PO intake. He continues to report globus sensation with some solids, which sometimes eventually clears. He does report occasionally having to regurgitate solids, though not since admission. We discussed recommendation to follow up with GI to determine if he may benefit from further esophageal workup. Communicated recommendation for outpatient GI referral to Dr. Isidoro Donning.   No further acute SLP needs identified at this time. SLP will sign off. Please re-consult if pt exhibits concerns for aspiration with PO intake.    HPI HPI: Brief Admission History:   89 year old male with history of Afib on warfarin, s/p AVR, CAD, HTN, h/o AVM, h/o esophageal ulcer, h/o CHB with pacemaker in place, GERD who presented to ED by home complaining of cough, chills and SOB that started yesterday      SLP Plan  Discharge SLP treatment due to (comment);All goals met (goals met)      Recommendations for follow up therapy are one component of a multi-disciplinary discharge planning process, led by the attending physician.  Recommendations may be updated based on patient status, additional functional criteria and insurance authorization.    Recommendations  Diet recommendations: Regular;Thin liquid Liquids provided via: Cup;Straw Medication Administration: Whole meds with puree Supervision: Patient able to self feed Compensations: Small sips/bites;Slow rate Postural Changes and/or Swallow Maneuvers: Seated upright 90 degrees;Upright 30-60 min after meal                  Oral care BID   Other (comment) (defer to PT/OT recommendations) Dysphagia,  pharyngoesophageal phase (R13.14)     Discharge SLP treatment due to (comment);All goals met (goals met)     Ellery Plunk  12/24/2023, 11:35 AM

## 2023-12-24 NOTE — Discharge Summary (Signed)
 Physician Discharge Summary   Patient: Douglas [name] MRN: [id number] DOB: [birth year]  Admit date:     12/19/2023  Discharge date: 12/24/23  Discharge Physician: Thad Ranger, MD    PCP: Carylon Perches, MD   Recommendations at discharge:   Continue linezolid 600 mg p.o. twice daily for 2 weeks ID follow-up scheduled on/10/2023 with Dr. Renold Don  Discharge Diagnoses:  Sepsis secondary to CAP (community acquired pneumonia) Enterobacter faecalis bacteremia   Essential hypertension, benign   Coronary atherosclerosis of native coronary artery   Atrial fibrillation (HCC)   Pacemaker-Medtronic   Aortic valve replaced   Chronic anticoagulation - Coumadin, CHADS2VASC=5   History of esophageal ulcer   H/O arteriovenous malformation (AVM)   Oropharyngeal dysphagia   AKI (acute kidney injury) (HCC)   Leukocytosis   Lactic acidosis   Generalized weakness   Hyperglycemia   Acute systolic (congestive) heart failure (HCC)   Pressure injury of skin   Hospital Course:   Patient is a 89 year old male with Afib on warfarin, s/p AVR, CAD, HTN, h/o AVM, h/o esophageal ulcer, h/o CHB with pacemaker in place, GERD who presented to [facility] ED by home complaining of cough, chills and SOB that started yesterday.  No chest pain, reported overall malaise and weakness.  No known sick contacts, reported that he is fairly active at his age and was working outside on a tractor a day before the admission.  He had flulike symptoms, headache, sore throat.    On arrival he was treated for sepsis due to pneumonia with leukocytosis and abnormal chest xray.  He had abnormal lung sounds and admission was requested for further management.  Workup revealed sepsis secondary to Enterobacter faecalis bacteremia.  Infectious disease was consulted, transferred to [facility] for cardiology/EP consultation.     Assessment and Plan:   Sepsis secondary to CAP (community acquired pneumonia), bacteremia, POA Acute  respiratory failure with hypoxia:  -Required 3 L by [place] in ED.  Now on room air. -On arrival, had tachypnea, hypoxia, borderline BP, leukocytosis, lactic acid, chest x-ray with pneumonia. -Patient was placed on IV Zithromax, Rocephin   Sepsis secondary to Enterobacter faecalis bacteremia -ID was consulted, patient was placed on ampicillin -Cardiology consulted, underwent TEE, no evidence of endocarditis -Per ID, patient to continue 2 weeks of linezolid, outpatient follow-up with ID scheduled. Has Medtronic dual-chamber pacemaker, was seen by EP cardiology, recommended to continue antibiotics at this time.   Acute HFmrEF:  -BNP 820.  TTE with LVEF of 40% (55 to 60% in 08/2023), ?DD  and RVSP of 52.  - Diuresed with IV Lasix.  Continue Lasix 20 mg every other day.   AKI ruled out:  Cr 1.35 on admission (was 1.23 on 11/7).  -Creatinine 1.0 at discharge    Paroxysmal A-fib: Rate controlled on metoprolol.  On warfarin for anticoagulation.  INR 3.0. History of complete heart block/PPM in 2013: Noted -Continue metoprolol and warfarin -Optimize electrolytes   Hyperlipidemia -Continue pravastatin   Gout -Continue allopurinol   GERD -Continue PPI   Physical deconditioning -PT/OT evaluation recommended home health PT OT   Pressure Injury documentation Perineum bilateral stage I, POA    Obesity class II  Estimated body mass index is 33.2 kg/m as calculated from the following:   Height as of this encounter: 5\' 6"  (1.676 m).   Weight as of this encounter: 93.3 kg.     Pain control - [employer] Controlled Substance Reporting System database was reviewed. and patient was instructed,  not to drive, operate heavy machinery, perform activities at heights, swimming or participation in water activities or provide baby-sitting services while on Pain, Sleep and Anxiety Medications; until their outpatient Physician has advised to do so again. Also recommended to not to take more than  prescribed Pain, Sleep and Anxiety Medications.  Consultants: Infectious disease, cardiology Procedures performed:   Disposition: Home Diet recommendation: Dysphagia 3 diet with thin liquids  DISCHARGE MEDICATION: Allergies as of 12/24/2023       Reactions   Imdur [isosorbide Nitrate] Other (See Comments)   Nausea and lightheaded         Medication List     TAKE these medications    acetaminophen 500 MG tablet Commonly known as: TYLENOL Take 1,000 mg by mouth every 6 (six) hours as needed for mild pain.   allopurinol 300 MG tablet Commonly known as: ZYLOPRIM Take 300 mg by mouth daily. Take 1  daily   diclofenac Sodium 1 % Gel Commonly known as: VOLTAREN Apply 2 g topically 4 (four) times daily. Apply to knees   furosemide 20 MG tablet Commonly known as: LASIX Take 20 mg by mouth every other day.   linezolid 600 MG tablet Commonly known as: ZYVOX Take 1 tablet (600 mg total) by mouth 2 (two) times daily for 13 days.   metoprolol succinate 25 MG 24 hr tablet Commonly known as: TOPROL-XL TAKE (2) TABLETS BY MOUTH DAILY.   pantoprazole 40 MG tablet Commonly known as: PROTONIX TAKE ONE TABLET BY MOUTH ONCE DAILY IN THE MORNING.   pravastatin 20 MG tablet Commonly known as: PRAVACHOL Take 20 mg by mouth every evening.   sennosides-docusate sodium 8.6-50 MG tablet Commonly known as: SENOKOT-S Take 1 tablet by mouth 2 (two) times daily.   warfarin 5 MG tablet Commonly known as: COUMADIN Take as directed. If you are unsure how to take this medication, talk to your nurse or doctor. Original instructions: TAKE 1 TABLET BY MOUTH ONCE DAILY OR AS DIRECTED. What changed: See the new instructions.               Discharge Care Instructions  (From admission, onward)           Start     Ordered   12/24/23 0000  If the dressing is still on your incision site when you go home, remove it on the third day after your surgery date. Remove dressing if it begins to  fall off, or if it is dirty or damaged before the third day.        12/24/23 1242            Follow-up Information     Carylon Perches, MD. Go on 01/06/2024.   Specialty: Internal Medicine Why: @9 :30am Contact information: [address] [place] [address] [phone]         [facility] Follow up.   Why: Agency will call you to set up apt times Contact information: [address] [phone]        Vu, Gershon Mussel T, MD Follow up on 01/21/2024.   Specialty: Infectious Diseases Why: at 11: 00 am, for hospital follow-up Contact information: [address] [place] [address] [place] [address] [phone]                Discharge Exam: Ceasar Mons Weights   12/21/23 0429 12/23/23 0500 12/24/23 0425  Weight: 94.3 kg 93.3 kg 93.4 kg   S: No acute complaints, hoping to go home today.  Cleared by ID and cardiology.  BP (!) 148/73 (BP Location: Left Arm)   Pulse 74   Temp 97.9 F (36.6 C) (Oral)   Resp 19   Ht 5\' 6"  (1.676 m)   Wt 93.4 kg   SpO2 100%   BMI 33.23 kg/m   Physical Exam General: Alert and oriented x 3, NAD Cardiovascular: S1 S2 clear, RRR.  Respiratory: CTAB, no wheezing Gastrointestinal: Soft, nontender, nondistended, NBS Ext: no pedal edema bilaterally Neuro: no new deficits Psych: Normal affect    Condition at discharge: fair  The results of significant diagnostics from this hospitalization (including imaging, microbiology, ancillary and laboratory) are listed below for reference.   Imaging Studies: ECHO TEE Result Date: 12/23/2023    TRANSESOPHOGEAL ECHO REPORT   Patient Name:   Douglas [name] Date of Exam: 12/23/2023 Medical Rec #:  [id number]     Height:       66.0 in Accession #:    [id number]    Weight:       205.7 lb Date of Birth:  1928/06/28     BSA:          2.024 m Patient Age:    89 years      BP:           157/72 mmHg Patient Gender: M             HR:           69 bpm. Exam Location:  Inpatient Procedure: Transesophageal  Echo and 3D Echo (Both Spectral and Color Flow            Doppler were utilized during procedure). Indications:     endocarditis  History:         Patient has prior history of Echocardiogram examinations, most                  recent 12/20/2023. Pacemaker and Bentall, Arrythmias:Atrial                  Fibrillation; Risk Factors:Hypertension.                  Aortic Valve: 23 mm Edwards bioprosthetic valve is present in                  the aortic position. Procedure Date: 01/24/12.  Sonographer:     Delcie Roch RDCS Referring Phys:  [id number] Jonita Albee Diagnosing Phys: Jodelle Red MD PROCEDURE: After discussion of the risks and benefits of a TEE, an informed consent was obtained from the patient. The transesophogeal probe was passed without difficulty through the esophogus of the patient. Imaged were obtained with the patient in a left lateral decubitus position. Sedation performed by different physician. The patient was monitored while under deep sedation. Anesthestetic sedation was provided intravenously by Anesthesiology: 354mg  of Propofol, 100mg  of Lidocaine. Image quality was  good. The patient's vital signs; including heart rate, blood pressure, and oxygen saturation; remained stable throughout the procedure. The patient developed no complications during the procedure.  IMPRESSIONS  1. Left ventricular ejection fraction, by estimation, is 40 to 45%. The left ventricle has mildly decreased function. The left ventricle demonstrates global hypokinesis.  2. Right ventricular systolic function is low normal. The right ventricular size is mildly enlarged. There is moderately elevated pulmonary artery systolic pressure. The estimated right ventricular systolic pressure is 50.0 mmHg.  3. Left atrial size was severely dilated. No left atrial/left atrial appendage thrombus was detected. The  LAA emptying velocity was 17 cm/s.  4. Right atrial size was moderately dilated.  5. The mitral valve is  normal in structure. Mild to moderate mitral valve regurgitation. No evidence of mitral stenosis.  6. Tricuspid valve regurgitation is moderate.  7. The aortic valve has been repaired/replaced. There is mild calcification of the aortic valve. There is mild thickening of the aortic valve. Aortic valve regurgitation is not visualized. There is a 23 mm Edwards bioprosthetic valve present in the aortic position. Procedure Date: 01/24/12.  8. S/P ascending aorta repair 01/25/2012 with 28 mm Bentall conduit. No evidence of abscess. Aortic root/ascending aorta has been repaired/replaced.  9. 3D performed of the aortic valve and demonstrates No evidence of aortic valve endocarditis. Conclusion(s)/Recommendation(s): No evidence of vegetation/infective endocarditis on this transesophageael echocardiogram. FINDINGS  Left Ventricle: Left ventricular ejection fraction, by estimation, is 40 to 45%. The left ventricle has mildly decreased function. The left ventricle demonstrates global hypokinesis. The left ventricular internal cavity size was normal in size. Right Ventricle: The right ventricular size is mildly enlarged. Right vetricular wall thickness was not well visualized. Right ventricular systolic function is low normal. There is moderately elevated pulmonary artery systolic pressure. The tricuspid regurgitant velocity is 3.24 m/s, and with an assumed right atrial pressure of 8 mmHg, the estimated right ventricular systolic pressure is 50.0 mmHg. Left Atrium: Left atrial size was severely dilated. No left atrial/left atrial appendage thrombus was detected. The LAA emptying velocity was 17 cm/s. Right Atrium: Right atrial size was moderately dilated. Pericardium: Trivial pericardial effusion is present. Mitral Valve: The mitral valve is normal in structure. Mild to moderate mitral valve regurgitation. No evidence of mitral valve stenosis. There is no evidence of mitral valve vegetation. Tricuspid Valve: The tricuspid valve is  normal in structure. Tricuspid valve regurgitation is moderate . No evidence of tricuspid stenosis. There is no evidence of tricuspid valve vegetation. Aortic Valve: The aortic valve has been repaired/replaced. There is mild calcification of the aortic valve. There is mild thickening of the aortic valve. Aortic valve regurgitation is not visualized. There is a 23 mm Edwards bioprosthetic valve present in the aortic position. Procedure Date: 01/24/12. There is no evidence of aortic valve vegetation. Pulmonic Valve: The pulmonic valve was grossly normal. Pulmonic valve regurgitation is trivial. No evidence of pulmonic stenosis. There is no evidence of pulmonic valve vegetation. Aorta: S/P ascending aorta repair 01/25/2012 with 28 mm Bentall conduit. No evidence of abscess. The aortic root/ascending aorta has been repaired/replaced. IAS/Shunts: No atrial level shunt detected by color flow Doppler. Additional Comments: Spectral Doppler performed. LEFT VENTRICLE PLAX 2D LVOT diam:     1.80 cm LVOT Area:     2.54 cm  TRICUSPID VALVE TR Peak grad:   42.0 mmHg TR Vmax:        324.00 cm/s  SHUNTS Systemic Diam: 1.80 cm Jodelle Red MD Electronically signed by Jodelle Red MD Signature Date/Time: 12/23/2023/8:19:18 PM    Final    EP STUDY Result Date: 12/23/2023 See surgical note for result.  ECHOCARDIOGRAM COMPLETE Result Date: 12/20/2023    ECHOCARDIOGRAM REPORT   Patient Name:   Douglas [name] Date of Exam: 12/20/2023 Medical Rec #:  [id number]     Height:       66.0 in Accession #:    [id number]    Weight:       200.8 lb Date of Birth:  Sep 12, 1928     BSA:          2.003  m Patient Age:    89 years      BP:           150/79 mmHg Patient Gender: M             HR:           79 bpm. Exam Location:  Inpatient Procedure: 2D Echo, Cardiac Doppler, Color Doppler and Intracardiac            Opacification Agent (Both Spectral and Color Flow Doppler were            utilized during procedure). Indications:     Bacteremia  History:        Patient has prior history of Echocardiogram examinations, most                 recent 09/10/2023. Risk Factors:Hypertension.                 Aortic Valve: 23 mm Edwards valve is present in the aortic                 position.  Sonographer:    Karma Ganja Referring Phys: [id number] Cleora Fleet  Sonographer Comments: Technically difficult study due to poor echo windows. IMPRESSIONS  1. Left ventricular ejection fraction, by estimation, is 40%. The left ventricle has mildly decreased function. Left ventricular endocardial border not optimally defined to evaluate regional wall motion. There is moderate left ventricular hypertrophy. Left ventricular diastolic parameters are indeterminate. There is the interventricular septum is flattened in systole and diastole, consistent with right ventricular pressure and volume overload.  2. Right ventricular systolic function was not well visualized. The right ventricular size is not well visualized. There is moderately elevated pulmonary artery systolic pressure.  3. The mitral valve was not well visualized. No evidence of mitral valve regurgitation. No evidence of mitral stenosis.  4. The tricuspid valve is abnormal.  5. 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 The aortic valve has been repaired/replaced. Aortic valve regurgitation is not visualized. No aortic stenosis is present. There is a 23 mm Edwards valve present in the aortic position.  6. The pulmonic valve was abnormal.  7. The inferior vena cava is normal in size with greater than 50% respiratory variability, suggesting right atrial pressure of 3 mmHg. FINDINGS  Left Ventricle: Left ventricular ejection fraction, by estimation, is 40%. The left ventricle has mildly decreased function. Left ventricular endocardial border not optimally defined to evaluate regional wall motion. Definity contrast agent was  given IV  to delineate the left ventricular endocardial borders. Strain imaging was not performed. The left ventricular internal cavity size was normal in size. There is moderate left ventricular hypertrophy. The interventricular septum is flattened in systole and diastole, consistent with right ventricular pressure and volume overload. Left ventricular diastolic parameters are indeterminate. Right Ventricle: The right ventricular size is not well visualized. Right vetricular wall thickness was not well visualized. Right ventricular systolic function was not well visualized. There is moderately elevated pulmonary artery systolic pressure. The  tricuspid regurgitant velocity is 3.50 m/s, and with an assumed right atrial pressure of 3 mmHg, the estimated right ventricular systolic pressure is 52.0 mmHg. Left Atrium: Left atrial size was not well visualized. Right Atrium: Right atrial size was not well visualized. Pericardium: There is no evidence of pericardial effusion. Mitral Valve: The mitral valve was not well visualized. No evidence of mitral  valve regurgitation. No evidence of mitral valve stenosis. Tricuspid Valve: The tricuspid valve is abnormal. Tricuspid valve regurgitation is mild . No evidence of tricuspid stenosis. 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 The aortic valve has been repaired/replaced. Aortic valve regurgitation is not visualized. No aortic stenosis is present. Aortic valve mean gradient measures 1.0 mmHg. Aortic valve peak gradient measures 2.0 mmHg. Aortic valve area, by VTI measures 2.79 cm. There is a 23 mm Edwards valve present in the aortic position. Pulmonic Valve: The pulmonic valve was abnormal. Pulmonic valve regurgitation is mild. No evidence of pulmonic stenosis. Aorta: The aortic root was not well visualized. Venous: The inferior vena cava is normal in size with greater than 50%  respiratory variability, suggesting right atrial pressure of 3 mmHg. IAS/Shunts: The interatrial septum was not well visualized. Additional Comments: 3D imaging was not performed. A device lead is visualized in the right atrium and right ventricle.  LEFT VENTRICLE PLAX 2D LVIDd:         4.70 cm      Diastology LVIDs:         3.70 cm      LV e' medial:    8.27 cm/s LV PW:         1.30 cm      LV E/e' medial:  15.1 LV IVS:        1.30 cm      LV e' lateral:   7.83 cm/s LVOT diam:     1.90 cm      LV E/e' lateral: 16.0 LV SV:         33 LV SV Index:   17 LVOT Area:     2.84 cm  LV Volumes (MOD) LV vol d, MOD A2C: 87.5 ml LV vol d, MOD A4C: 114.0 ml LV vol s, MOD A2C: 44.2 ml LV vol s, MOD A4C: 67.6 ml LV SV MOD A2C:     43.3 ml LV SV MOD A4C:     114.0 ml LV SV MOD BP:      46.3 ml RIGHT VENTRICLE            IVC RV Basal diam:  4.50 cm    IVC diam: 1.60 cm RV S prime:     5.87 cm/s TAPSE (M-mode): 1.6 cm LEFT ATRIUM              Index        RIGHT ATRIUM           Index LA diam:        6.10 cm  3.05 cm/m   RA Area:     25.00 cm LA Vol (A2C):   83.6 ml  41.73 ml/m  RA Volume:   85.70 ml  42.78 ml/m LA Vol (A4C):   118.0 ml 58.91 ml/m LA Biplane Vol: 103.0 ml 51.42 ml/m  AORTIC VALVE AV Area (Vmax):    2.71 cm AV Area (Vmean):   2.62 cm AV Area (VTI):     2.79 cm AV Vmax:           70.60 cm/s AV Vmean:          46.200 cm/s AV VTI:            0.119 m AV Peak Grad:      2.0 mmHg AV Mean Grad:      1.0 mmHg LVOT Vmax:  67.40 cm/s LVOT Vmean:        42.700 cm/s LVOT VTI:          0.117 m LVOT/AV VTI ratio: 0.98 MITRAL VALVE                TRICUSPID VALVE MV Area (PHT): 4.10 cm     TR Peak grad:   49.0 mmHg MV Decel Time: 185 msec     TR Vmax:        350.00 cm/s MV E velocity: 125.00 cm/s MV A velocity: 34.00 cm/s   SHUNTS MV E/A ratio:  3.68         Systemic VTI:  0.12 m                             Systemic Diam: 1.90 cm Dina Rich MD Electronically signed by Dina Rich MD Signature Date/Time:  12/20/2023/3:32:16 PM    Final    DG CHEST PORT 1 VIEW Result Date: 12/19/2023 CLINICAL DATA:  Cough. EXAM: PORTABLE CHEST 1 VIEW COMPARISON:  Chest radiograph dated 12/19/2023 at 5:34 a.m. FINDINGS: Patient is rotated to the left. Stable mild cardiomegaly. Prior median sternotomy and aortic valve repair. Stable left chest wall pacemaker in place. Similar retrocardiac opacity and mild patchy right basilar opacities. No pneumothorax identified. No acute osseous abnormality. IMPRESSION: 1. Similar retrocardiac and mild patchy right basilar opacities could reflect atelectasis or infiltrate. 2. Similar blunting of the left costophrenic angle could reflect scarring or a small effusion. Electronically Signed   By: Hart Robinsons M.D.   On: 12/19/2023 11:49   DG Chest Portable 1 View Result Date: 12/19/2023 CLINICAL DATA:  Persistent cough EXAM: PORTABLE CHEST 1 VIEW COMPARISON:  08/29/2023 FINDINGS: Unchanged left chest wall pacer. Previous median sternotomy and aortic valve repair. Aortic atherosclerotic calcifications. Stable cardiomediastinal contours. Retrocardiac opacity with indistinct left hemidiaphragm is identified. Blunting of the left costophrenic angle. Mild patchy opacities identified in the right lower lung. Visualized osseous structures are unremarkable. IMPRESSION: 1. Retrocardiac opacity with indistinct left hemidiaphragm. Findings may reflect atelectasis and/or pneumonia. 2. Mild patchy opacities in the right lower lung. 3. Blunting of left costophrenic angle which may reflect chronic pleuroparenchymal scarring versus small effusion. Electronically Signed   By: Signa Kell M.D.   On: 12/19/2023 06:12    Microbiology: Results for orders placed or performed during the hospital encounter of 12/19/23  Resp panel by RT-PCR (RSV, Flu A&B, Covid) Anterior Nasal Swab     Status: None   Collection Time: 12/19/23  5:31 AM   Specimen: Anterior Nasal Swab  Result Value Ref Range Status   SARS  Coronavirus 2 by RT PCR NEGATIVE NEGATIVE Final    Comment: (NOTE) SARS-CoV-2 target nucleic acids are NOT DETECTED.  The SARS-CoV-2 RNA is generally detectable in upper respiratory specimens during the acute phase of infection. The lowest concentration of SARS-CoV-2 viral copies this assay can detect is 138 copies/mL. A negative result does not preclude SARS-Cov-2 infection and should not be used as the sole basis for treatment or other patient management decisions. A negative result may occur with  improper specimen collection/handling, submission of specimen other than nasopharyngeal swab, presence of viral mutation(s) within the areas targeted by this assay, and inadequate number of viral copies(<138 copies/mL). A negative result must be combined with clinical observations, patient history, and epidemiological information. The expected result is Negative.  Fact Sheet for Patients:  BloggerCourse.com  Fact Sheet for Healthcare  Providers:  SeriousBroker.it  This test is no t yet approved or cleared by the [place] and  has been authorized for detection and/or diagnosis of SARS-CoV-2 by FDA under an Emergency Use Authorization (EUA). This EUA will remain  in effect (meaning this test can be used) for the duration of the COVID-19 declaration under Section 564(b)(1) of the Act, 21 U.S.C.section 360bbb-3(b)(1), unless the authorization is terminated  or revoked sooner.       Influenza A by PCR NEGATIVE NEGATIVE Final   Influenza B by PCR NEGATIVE NEGATIVE Final    Comment: (NOTE) The Xpert Xpress SARS-CoV-2/FLU/RSV plus assay is intended as an aid in the diagnosis of influenza from Nasopharyngeal swab specimens and should not be used as a sole basis for treatment. Nasal washings and aspirates are unacceptable for Xpert Xpress SARS-CoV-2/FLU/RSV testing.  Fact Sheet for  Patients: BloggerCourse.com  Fact Sheet for Healthcare Providers: SeriousBroker.it  This test is not yet approved or cleared by the [place] FDA and has been authorized for detection and/or diagnosis of SARS-CoV-2 by FDA under an Emergency Use Authorization (EUA). This EUA will remain in effect (meaning this test can be used) for the duration of the COVID-19 declaration under Section 564(b)(1) of the Act, 21 U.S.C. section 360bbb-3(b)(1), unless the authorization is terminated or revoked.     Resp Syncytial Virus by PCR NEGATIVE NEGATIVE Final    Comment: (NOTE) Fact Sheet for Patients: BloggerCourse.com  Fact Sheet for Healthcare Providers: SeriousBroker.it  This test is not yet approved or cleared by the [place] FDA and has been authorized for detection and/or diagnosis of SARS-CoV-2 by FDA under an Emergency Use Authorization (EUA). This EUA will remain in effect (meaning this test can be used) for the duration of the COVID-19 declaration under Section 564(b)(1) of the Act, 21 U.S.C. section 360bbb-3(b)(1), unless the authorization is terminated or revoked.  Performed at [facility], [address], [place], [place] [address]   Blood Culture (routine x 2)     Status: None   Collection Time: 12/19/23  5:31 AM   Specimen: BLOOD  Result Value Ref Range Status   Specimen Description BLOOD RIGHT ANTECUBITAL  Final   Special Requests   Final    BOTTLES DRAWN AEROBIC AND ANAEROBIC Blood Culture adequate volume   Culture   Final    NO GROWTH 5 DAYS Performed at [facility], [address] [place], [place] [address]    Report Status 12/24/2023 FINAL  Final  Blood Culture (routine x 2)     Status: Abnormal   Collection Time: 12/19/23  6:06 AM   Specimen: BLOOD  Result Value Ref Range Status   Specimen Description   Final    BLOOD BLOOD RIGHT HAND Performed at [facility], [address], [place], [place] [address]    Special Requests   Final    BOTTLES DRAWN AEROBIC AND ANAEROBIC Blood Culture adequate volume Performed at [facility], [address], [place], [place] [address]    Culture  Setup Time   Final    GRAM POSITIVE COCCI IN BOTH AEROBIC AND ANAEROBIC BOTTLES Gram Stain Report Called to,Read Back By and Verified With: Kathee Polite [phone], VIRAY,J Organism ID to follow CRITICAL RESULT CALLED TO, READ BACK BY AND VERIFIED WITH: P BINES,RN@0028  12/20/23 MK Performed at [facility], [address] [place], [place] [address]    Culture ENTEROCOCCUS FAECALIS (A)  Final   Report Status 12/22/2023 FINAL  Final  Organism ID, Bacteria ENTEROCOCCUS FAECALIS  Final      Susceptibility   Enterococcus faecalis - MIC*    AMPICILLIN <=2 SENSITIVE Sensitive     VANCOMYCIN 1 SENSITIVE Sensitive     GENTAMICIN SYNERGY RESISTANT Resistant     * ENTEROCOCCUS FAECALIS  Blood Culture ID Panel (Reflexed)     Status: Abnormal   Collection Time: 12/19/23  6:06 AM  Result Value Ref Range Status   Enterococcus faecalis DETECTED (A) NOT DETECTED Final    Comment: CRITICAL RESULT CALLED TO, READ BACK BY AND VERIFIED WITH: P BINES,RN@0027  12/20/23 MK    Enterococcus Faecium NOT DETECTED NOT DETECTED Final   Listeria monocytogenes NOT DETECTED NOT DETECTED Final   Staphylococcus species NOT DETECTED NOT DETECTED Final   Staphylococcus aureus (BCID) NOT DETECTED NOT DETECTED Final   Staphylococcus epidermidis NOT DETECTED NOT DETECTED Final   Staphylococcus lugdunensis NOT DETECTED NOT DETECTED Final   Streptococcus species NOT DETECTED NOT DETECTED Final   Streptococcus agalactiae NOT DETECTED NOT DETECTED Final   Streptococcus pneumoniae NOT DETECTED NOT DETECTED Final   Streptococcus pyogenes NOT DETECTED NOT DETECTED Final   A.calcoaceticus-baumannii NOT DETECTED NOT DETECTED Final   Bacteroides fragilis NOT DETECTED NOT DETECTED Final    Enterobacterales NOT DETECTED NOT DETECTED Final   Enterobacter cloacae complex NOT DETECTED NOT DETECTED Final   Escherichia coli NOT DETECTED NOT DETECTED Final   Klebsiella aerogenes NOT DETECTED NOT DETECTED Final   Klebsiella oxytoca NOT DETECTED NOT DETECTED Final   Klebsiella pneumoniae NOT DETECTED NOT DETECTED Final   Proteus species NOT DETECTED NOT DETECTED Final   Salmonella species NOT DETECTED NOT DETECTED Final   Serratia marcescens NOT DETECTED NOT DETECTED Final   Haemophilus influenzae NOT DETECTED NOT DETECTED Final   Neisseria meningitidis NOT DETECTED NOT DETECTED Final   Pseudomonas aeruginosa NOT DETECTED NOT DETECTED Final   Stenotrophomonas maltophilia NOT DETECTED NOT DETECTED Final   Candida albicans NOT DETECTED NOT DETECTED Final   Candida auris NOT DETECTED NOT DETECTED Final   Candida glabrata NOT DETECTED NOT DETECTED Final   Candida krusei NOT DETECTED NOT DETECTED Final   Candida parapsilosis NOT DETECTED NOT DETECTED Final   Candida tropicalis NOT DETECTED NOT DETECTED Final   Cryptococcus neoformans/gattii NOT DETECTED NOT DETECTED Final   Vancomycin resistance NOT DETECTED NOT DETECTED Final    Comment: Performed at [facility], [address], [place], [place] [address]  Culture, blood (Routine X 2) w Reflex to ID Panel     Status: None (Preliminary result)   Collection Time: 12/23/23  7:31 AM   Specimen: BLOOD RIGHT HAND  Result Value Ref Range Status   Specimen Description BLOOD RIGHT HAND  Final   Special Requests   Final    BOTTLES DRAWN AEROBIC AND ANAEROBIC Blood Culture results may not be optimal due to an inadequate volume of blood received in culture bottles   Culture   Final    NO GROWTH 1 DAY Performed at [facility], [address], [place], [place] [address]    Report Status PENDING  Incomplete  Culture, blood (Routine X 2) w Reflex to ID Panel     Status: None (Preliminary result)   Collection Time: 12/23/23  7:31 AM    Specimen: BLOOD  Result Value Ref Range Status   Specimen Description BLOOD LEFT ANTECUBITAL  Final   Special Requests   Final    BOTTLES DRAWN AEROBIC AND ANAEROBIC Blood Culture results may not  be optimal due to an inadequate volume of blood received in culture bottles   Culture   Final    NO GROWTH 1 DAY Performed at [facility], [address] [place], [place] [address]    Report Status PENDING  Incomplete    Labs: CBC: Recent Labs  Lab 12/19/23 0531 12/20/23 0502 12/21/23 0849 12/22/23 0227 12/23/23 0731 12/24/23 0417  WBC 14.9* 16.4* 11.8* 10.4 8.5 8.3  NEUTROABS 13.0* 13.2* 9.6*  --   --   --   HGB 12.3* 12.7* 11.3* 11.2* 11.7* 10.9*  HCT 39.4 40.5 35.8* 35.0* 36.6* 33.7*  MCV 95.4 96.4 94.0 93.8 93.1 94.1  PLT 174 114* 143* 123* 177 184   Basic Metabolic Panel: Recent Labs  Lab 12/20/23 0502 12/21/23 0849 12/22/23 0227 12/23/23 0731 12/24/23 0417  NA 133* 135 136 136 139  K 4.1 3.9 4.2 3.5 4.5  CL 106 102 100 99 108  CO2 19* 24 22 24 25   GLUCOSE 103* 150* 117* 99 123*  BUN 27* 24* 24* 21 22  CREATININE 1.18 1.19 1.14 1.01 1.04  CALCIUM 8.6* 8.7* 8.6* 8.5* 8.2*  MG 1.7  --   --   --  1.6*   Liver Function Tests: No results for input(s): "AST", "ALT", "ALKPHOS", "BILITOT", "PROT", "ALBUMIN" in the last 168 hours. CBG: No results for input(s): "GLUCAP" in the last 168 hours.  Discharge time spent: greater than 30 minutes.  Signed: Thad Ranger, MD [facility] 12/24/2023

## 2023-12-24 NOTE — Plan of Care (Signed)

## 2023-12-24 NOTE — Progress Notes (Addendum)
 PHARMACY - ANTICOAGULATION CONSULT NOTE  Pharmacy Consult for warfarin Indication: atrial fibrillation  Allergies  Allergen Reactions   Imdur [Isosorbide Nitrate] Other (See Comments)    Nausea and lightheaded     Patient Measurements: Height: 5\' 6"  (167.6 cm) Weight: 93.4 kg (205 lb 14.4 oz) IBW/kg (Calculated) : 63.8  Vital Signs: Temp: 97.6 F (36.4 C) (03/04 0415) Temp Source: Oral (03/04 0415) BP: 137/71 (03/04 0415) Pulse Rate: 85 (03/04 0415)  Labs: Recent Labs    12/22/23 0227 12/23/23 0731 12/24/23 0417  HGB 11.2* 11.7* 10.9*  HCT 35.0* 36.6* 33.7*  PLT 123* 177 184  LABPROT 37.3* 31.2* 34.2*  INR 3.8* 3.0* 3.3*  CREATININE 1.14 1.01 1.04    Estimated Creatinine Clearance: 46.4 mL/min (by C-G formula based on SCr of 1.04 mg/dL).   Medical History: Past Medical History:  Diagnosis Date   Acute upper GI bleed 05/18/2016   Aneurysm of thoracic aorta (HCC)    Ascending. 5.7 x 5.5cm - Bentall procedure   Aortic stenosis    Bioprosthetic AVR   Arthritis    Atrial fibrillation (HCC)    Documented on pacer interrogation 6/13   Complete heart block (HCC)    Status post pacemaker placement   Coronary atherosclerosis of native coronary artery    Multivessel - LIMA to LAD, SVG to OM, SVG to PDA   DJD (degenerative joint disease)    Esophageal dysmotility    Essential hypertension    Fibromyalgia    Gout    H/O arteriovenous malformation (AVM)    H/O hiatal hernia    Hemorrhoid    History of esophageal ulcer    History of skin cancer    Hx of adenomatous colonic polyps    Iron deficiency anemia due to chronic blood loss    Melena    Pacemaker-Medtronic    PMR (polymyalgia rheumatica) (HCC)    SIRS (systemic inflammatory response syndrome) (HCC) 01/16/2015    Medications:  See MAR  Assessment: Pharmacy consulted to dose warfarin in patient with atrial fibrillation. PMH includes aortic stenosis s/p AVR, CAD, pacemaker, HTN, hx AVM, hx esophageal  ulcers, acute UGIB, hemorrhoids.  Patient's home dose listed as 2.5 mg on Sun and 5 mg ROW (anti-coag visit note) with last dose 2/26. INR on admission is therapeutic at 2.7.  3/4 AM: Warfarin has been held since 2/28, INR now supratherapeutic at 3.3.  No signs of bleeding.  Good appetite this morning after being NPO for TEE yesterday.   Notable DDIs: ongoing ampicillin (2/28 > c); s/p Rocephin (2/27 > 3/3) and azithromycin (2/27-3/3) which increase the anticoagulant effects of warfarin.  Goal of Therapy:  INR 2-3 Monitor platelets by anticoagulation protocol: Yes  Monitoring: Date INR Warfarin Dose 2/27 2.7 5 mg 2/28 3.5 Held 3/1 3.8 Held 3/2 3.8 Held 3/3 3.0 Held  3/4 3.3 Held   Plan:  HOLD warfarin today Daily INR, CBC Monitor for signs/symptoms of bleeding Monitor appetite and for DDIs  3/4 UPDATE:  ID discharging on Linezolid 600 mg BID x2wk (EOT 3/16) - this will also increase his INR.    Discharge plan: Instructed patient to hold warfarin today and tomorrow, restart 5 mg daily on 3/6.  INR check scheduled for 3/10 at [facility] clinic (called personally, patient aware).  Trixie Rude, PharmD Clinical Pharmacist 12/24/2023  9:24 AM

## 2023-12-24 NOTE — Telephone Encounter (Signed)
 Paytes, Kirk Ruths, RPH  Louanna Raw, RN Dyanne Carrel,  I'm the pharmacist that's been helping out with Mr. Siefert care this admission.  He was on some antibiotics that have increased his INR (see below for trend/details) but the abbreviated version is that I am having him hold his warfarin today (3/4) and Wednesday (3/5) with INR 3.3 today and restart his 5 mg daily starting Thursday.  I got him set up to see you on Monday (3/10) but he may need more frequent monitoring pending his antibiotic washout.  This is a snippet from my warfarin note today -  3/4 AM: Warfarin has been held since 2/28, INR now supratherapeutic at 3.3.  No signs of bleeding.  Good appetite this morning after being NPO for TEE yesterday.   Notable DDIs: ongoing ampicillin (2/28 > 3/4); s/p Rocephin (2/27 > 3/3) and azithromycin (2/27-3/3) which increase the anticoagulant effects of warfarin.  Update: ID discharging on Linezolid   Goal of Therapy: INR 2-3 Monitor platelets by anticoagulation protocol: Yes   Monitoring: Date   INR           Warfarin Dose 2/27   2.75            HELD 2/28   3.5              HELD 3/1     3.8              HELD 3/2     3.8              HELD 3/3     3.0              HELD 3/4     3.3              HELD   Discharge plan: Hold warfarin today and tomorrow, restart 5 mg daily on 3/6. INR check scheduled for 3/10 at [facility] noted.  Will see pt on Monday. Misty Stanley

## 2023-12-25 ENCOUNTER — Other Ambulatory Visit (INDEPENDENT_AMBULATORY_CARE_PROVIDER_SITE_OTHER): Payer: Self-pay | Admitting: Gastroenterology

## 2023-12-25 ENCOUNTER — Telehealth: Payer: Self-pay

## 2023-12-25 NOTE — Telephone Encounter (Signed)
Will refill medication for 3 months, needs follow up appointment with any provider in order to receive any refills.  Thanks,  Markhi Kleckner Castaneda, MD Gastroenterology and Hepatology [facility]  

## 2023-12-25 NOTE — Telephone Encounter (Signed)
 I spoke with the patient and made him aware he will need an appointment for further refills, while on the phone patient says he has a bad case of the Hemorrhoids. I advised that he needs an appointment here to follow up on that issue, and I transferred him to Diana Eves to schedule him an  appointment.

## 2023-12-25 NOTE — Patient Instructions (Signed)
 Visit Information  Thank you for taking time to visit with me today. Please don't hesitate to contact me if I can be of assistance to you. Our next appointment is by telephone on 12/31/23 at 2:00pm with Lonia Chimera   Following is a copy of your care plan:   Goals Addressed             This Visit's Progress    TOC Care Plan       Current Barriers:  Medication management Coumadin  Provider appointments PCP Dr Ouida Sills 12/31/23 Home Health services [facility] [phone]  RNCM Clinical Goal(s):  Patient will work with the Care Management team over the next 30 days to address Transition of Care Barriers: Medication Management Support at home Provider appointments Home Health services take all medications exactly as prescribed and will call provider for medication related questions as evidenced by no missed medication doses  attend all scheduled medical appointments: with PCP Lab monitoring for Coumadin  as evidenced by no missed follow-up visits demonstrate ongoing self health care management ability  as evidenced by decreased ER visits and hospital admissions  through collaboration with RN Care manager, provider, and care team.   Interventions: Evaluation of current treatment plan related to  self management and patient's adherence to plan as established by provider  Transitions of Care:  New goal. Doctor Visits  - discussed the importance of doctor visits Contacted Health RN/OT/PT Rolene Arbour for [facility] date  Post discharge activity limitations prescribed by provider reviewed Reviewed Signs and symptoms of infection  AFIB Interventions: (Status:  New goal.) Short Term Goal   Reviewed importance of adherence to anticoagulant exactly as prescribed Counseled on bleeding risk associated with Coumadin  and importance of self-monitoring for signs/symptoms of bleeding Counseled on importance of regular laboratory monitoring as prescribed Counseled on seeking medical attention after a head  injury or if there is blood in the urine/stool Screening for signs and symptoms of depression related to chronic disease state  Assessed social determinant of health barriers  Patient Goals/Self-Care Activities: Participate in Transition of Care Program/Attend TOC scheduled calls Take all medications as prescribed Attend all scheduled provider appointments Call pharmacy for medication refills 3-7 days in advance of running out of medications Perform all self care activities independently  Perform IADL's (shopping, preparing meals, housekeeping, managing finances) independently Call provider office for new concerns or questions   Follow Up Plan:  Telephone follow up appointment with care management team member scheduled for:  12/31/23 @ 2:00 pm with Lonia Chimera  The patient has been provided with contact information for the care management team and has been advised to call with any health related questions or concerns.          Medication review  Reviewed current home medications -- provided education as needed. Patient is aware of potential side effects and was encouraged to notify PCP for any adverse side effects or unwanted symptoms not relieved with interventions  Patient will call 911 for Medical Emergencies or Life -Threatening Symptoms.  Reviewed goals for care Patient/ Caregiver verbalizes understanding of instructions with the plan of care . The  Patient / Caregiver was encouraged to make informed decisions about care, actively participate in managing health conditions, and implement lifestyle changes as needed to promote independence and self-management of healthcare. SDOH screenings have been completed and addressed if indicted.  There are no reported barriers to care.    Follow-up Plan [facility] Case Management Nurse will provide follow-up and on-going assessment ,evaluation  and education of disease processes, recommended interventions for both chronic and acute medical conditions  ,  along with ongoing review of symptoms ,medication reviews / reconciliation during each weekly call . Any updates , inconsistencies, discrepancies or acute care concerns will be addressed and routed to the correct Practitioner if indicated   [facility]  Please call the care guide team at [phone]  if you need to cancel or reschedule your appointment . For scheduled calls -Three attempts will be made to reach you -if the scheduled call is missed or  we are unable to reach the you after 3 attempts no additional outreach attempts will be made and the TOC follow-up will be closed .   If you need to speak to a Nurse you may  call me directly at the number below or if I am unavailable,and  your need is urgent  please call the main [facility] number at [phone] and ask to speak with one of the [facility] ( Transition of Care )  Nurses  .  Patient was encouraged to Contact PCP with any changes in baseline or  medication regimen,  changes in health status  /  well-being, safety concerns, including falls any questions or concerns regarding ongoing medical care, any difficulty obtaining or picking up prescriptions, any changes or worsening in condition- including  symptoms not relieved  with interventions                                                                            Additionally, If you experience worsening of your symptoms, develop shortness of breath, If you are experiencing a medical emergency,  develop suicidal or homicidal thoughts you must seek medical attention immediately by calling 911 or report to your local emergency department or urgent care.   If you have a non-emergency medical problem during routine business hours, please contact your provider's office and ask to speak with a nurse.       Please take the time to read instructions/literature along with the possible adverse reactions/side effects for all the Medicines that have been prescribed to you. Only take newly  prescribed  Medications after you have completely understood and accept all the possible adverse reactions/side effects.   Do not take more than prescribed Medications for  Pain, Sleep and Anxiety. Do not drive when taking Pain medications or sleep aid/ insomnia  medications It is not advisable to combine anxiety, sleep and pain medications without talking with your primary care practitioner    If you are experiencing a Mental Health or Behavioral Health Crisis or need someone to talk to Please call the Suicide and Crisis Lifeline: 988 You may also call the [place] National Suicide Prevention Lifeline: 437-365-3641 or TTY: (330)173-5638 TTY 5182256398) to talk to a trained counselor.  You may call the Behavioral Health Crisis Line at (640) 505-7040, at any time, 24 hours a day, 7 days a week- however If you are in danger or need immediate medical attention, call 911.   If you would like help to quit smoking, call 1-800-QUIT-NOW ( (662) 791-7887) OR Espaol: 1-855-Djelo-Ya (7-035-009-3818) o para ms informacin haga clic aqu or Text READY to 299-371 to register via text.   Susa Loffler ,  BSN, RN [employer]   VBCI-Population Health RN Care Manager Direct Dial [phone]  Website: [email or web address]

## 2023-12-25 NOTE — Transitions of Care (Post Inpatient/ED Visit) (Signed)
 12/25/2023  Name: Douglas [name] MRN: [id number] DOB: [birth year]  Today's TOC FU Call Status: Today's TOC FU Call Status:: Successful TOC FU Call Completed TOC FU Call Complete Date: 12/25/23 Patient's Name and Date of Birth confirmed.  Transition Care Management Follow-up Telephone Call Date of Discharge: 12/24/23 Discharge Facility: [facility]) Type of Discharge: Inpatient Admission Primary Inpatient Discharge Diagnosis:: Sepsis secondary to CAP (community acquired pneumonia)  Enterobacter faecalis bacteremia How have you been since you were released from the hospital?: Better Any questions or concerns?: Yes Patient Questions/Concerns:: He is concerned about his hemorrhoids- reviewed topical treatments available OTC and discussed with daughter  Clydie Braun. He is using Sitz baths and will get OTC medication  Patient Questions/Concerns Addressed: Provided Patient Educational Materials, Notified Provider of Patient Questions/Concerns  Items Reviewed: Did you receive and understand the discharge instructions provided?: Yes Medications obtained,verified, and reconciled?: Yes (Medications Reviewed) Any new allergies since your discharge?: No Dietary orders reviewed?: Yes Type of Diet Ordered:: Reg Heart Healthy Do you have support at home?: Yes People in Home: child(ren), adult, spouse Name of Support/Comfort Primary Source: Wife Burnell Blanks, Daughter Clydie Braun and Creed Copper, and Trey Paula  Medications Reviewed Today: Medications Reviewed Today     Reviewed by Johnnette Barrios, RN (Registered Nurse) on 12/25/23 at 1551  Med List Status: <None>   Medication Order Taking? Sig Documenting Provider Last Dose Status Informant  acetaminophen (TYLENOL) 500 MG tablet [id number] Yes Take 1,000 mg by mouth every 6 (six) hours as needed for mild pain. [provider] Taking Active Self, Pharmacy Records  allopurinol (ZYLOPRIM) 300 MG tablet [id number] Yes Take 300 mg by mouth daily. Take 1   daily [provider] Taking Active Self, Pharmacy Records  diclofenac Sodium (VOLTAREN) 1 % GEL [id number] Yes Apply 2 g topically 4 (four) times daily. Apply to knees Rai, Ripudeep K, MD Taking Active   furosemide (LASIX) 20 MG tablet [id number] Yes Take 20 mg by mouth every other day. [provider] Taking Active Self, Pharmacy Records  linezolid (ZYVOX) 600 MG tablet [id number] Yes Take 1 tablet (600 mg total) by mouth 2 (two) times daily for 13 days. Raymondo Band, MD Taking Active   LINZESS 145 MCG CAPS capsule [id number] Yes TAKE 1 CAPSULE ONCE DAILY BEFORE BREAKFAST. Marguerita Merles, Reuel Boom, MD Taking Active   metoprolol succinate (TOPROL-XL) 25 MG 24 hr tablet [id number] Yes TAKE (2) TABLETS BY MOUTH DAILY. Jonelle Sidle, MD Taking Active Self, Pharmacy Records  pantoprazole (PROTONIX) 40 MG tablet [id number] Yes TAKE ONE TABLET BY MOUTH ONCE DAILY IN THE MORNING. Jonelle Sidle, MD Taking Active Self, Pharmacy Records  pravastatin (PRAVACHOL) 20 MG tablet [id number] Yes Take 20 mg by mouth every evening.  [provider] Taking Active Self, Pharmacy Records           Med Note Abbe Amsterdam, HEATHER L   Fri May 18, 2016  3:24 PM)    sennosides-docusate sodium (SENOKOT-S) 8.6-50 MG tablet [id number] Yes Take 1 tablet by mouth 2 (two) times daily. Malissa Hippo, MD Taking Active Self, Pharmacy Records  warfarin (COUMADIN) 5 MG tablet [id number] Yes TAKE 1 TABLET BY MOUTH ONCE DAILY OR AS DIRECTED.  Patient taking differently: Take 5 mg by mouth daily. Or as directed   Jonelle Sidle, MD Taking Active Self, Pharmacy Records          Medication reconciliation / review completed based on most recent discharge summary and EHR medication list. Confirmed patient is  taking  prescribed medications as instructed (any discrepancies are noted in review section)   Patient &  Caregiver are aware of any changes to and / or  any dosage adjustments to medication regimen. None  noted this hospital stay  Patient &  Caregiver denies questions at this time and reports no barriers to medication adherence.    Home Care and Equipment/Supplies: Were Home Health Services Ordered?: Yes Name of Home Health Agency:: [facility] [phone] Has Agency set up a time to come to your home?: Yes First Home Health Visit Date: 12/25/23 Any new equipment or medical supplies ordered?: No  reviewed incisional care  dressing on your incision site remove it on the third day after your surgery date. Remove dressing if it begins to fall off, or if it is dirty or damaged before the third day.              Functional Questionnaire: Do you need assistance with bathing/showering or dressing?: No Do you need assistance with meal preparation?: No Do you need assistance with eating?: No Do you have difficulty maintaining continence: No Do you need assistance with getting out of bed/getting out of a chair/moving?: No Do you have difficulty managing or taking your medications?: No  Follow up appointments reviewed: PCP Follow-up appointment confirmed?: Yes Date of PCP follow-up appointment?: 12/31/23 Follow-up Provider: Busy - Default status  Carylon Perches, MD Specialist Hospital Follow-up appointment confirmed?: Yes Date of Specialist follow-up appointment?: 12/30/23 Follow-Up Specialty Provider:: Cardiologist 3/10, [facility] Do you need transportation to your follow-up appointment?: No (He has strong family support) Do you understand care options if your condition(s) worsen?: Yes-patient verbalized understanding  SDOH Interventions Today    Flowsheet Row Most Recent Value  SDOH Interventions   Food Insecurity Interventions Intervention Not Indicated  Housing Interventions Intervention Not Indicated  Transportation Interventions Intervention Not Indicated, Patient Resources (Friends/Family)  Utilities Interventions Intervention Not Indicated       Goals Addressed              This Visit's Progress    TOC Care Plan       Current Barriers:  Medication management Coumadin  Provider appointments PCP Dr Ouida Sills 12/31/23 Home Health services [facility] [phone]  RNCM Clinical Goal(s):  Patient will work with the Care Management team over the next 30 days to address Transition of Care Barriers: Medication Management Support at home Provider appointments Home Health services take all medications exactly as prescribed and will call provider for medication related questions as evidenced by no missed medication doses  attend all scheduled medical appointments: with PCP Lab monitoring for Coumadin  as evidenced by no missed follow-up visits demonstrate ongoing self health care management ability  as evidenced by decreased ER visits and hospital admissions  through collaboration with RN Care manager, provider, and care team.   Interventions: Evaluation of current treatment plan related to  self management and patient's adherence to plan as established by provider  Transitions of Care:  New goal. Doctor Visits  - discussed the importance of doctor visits Contacted Health RN/OT/PT [facility] date  Post discharge activity limitations prescribed by provider reviewed Reviewed Signs and symptoms of infection  AFIB Interventions: (Status:  New goal.) Short Term Goal   Reviewed importance of adherence to anticoagulant exactly as prescribed Counseled on bleeding risk associated with Coumadin  and importance of self-monitoring for signs/symptoms of bleeding Counseled on importance of regular laboratory monitoring as prescribed Counseled on seeking medical attention after a  head injury or if there is blood in the urine/stool Screening for signs and symptoms of depression related to chronic disease state  Assessed social determinant of health barriers  Patient Goals/Self-Care Activities: Participate in Transition of Care Program/Attend TOC scheduled calls Take all  medications as prescribed Attend all scheduled provider appointments Call pharmacy for medication refills 3-7 days in advance of running out of medications Perform all self care activities independently  Perform IADL's (shopping, preparing meals, housekeeping, managing finances) independently Call provider office for new concerns or questions   Follow Up Plan:  Telephone follow up appointment with care management team member scheduled for:  12/31/23 @ 2:00 pm with Lonia Chimera  The patient has been provided with contact information for the care management team and has been advised to call with any health related questions or concerns.          Patient is at high risk for readmission and/or has history of  high utilization  Discussed VBCI  TOC program and weekly calls to patient to assess condition/status, medication management  and provide support/education as indicated . Patient/ Caregiver voiced understanding and is  agreeable to 30 day program    Routine follow-up and on-going assessment evaluation and education of disease processes, recommended interventions for both chronic and acute medical conditions , will occur during each weekly visit along with ongoing review of symptoms ,medication reviews and reconciliation. Any updates , inconsistencies, discrepancies or acute care concerns will be addressed and routed to the correct Practitioner if indicated   Based on current information and Insurance plan -Reviewed benefits available to patient, including details about eligibility options for care if any area of needs were identified.  Reviewed patients ability to access and / or navigating the benefits system..Amb Referral made if indicted , refer to orders section of note for details   Please refer to Care Plan for goals and interventions -Effectiveness of interventions, symptom management and outcomes will be evaluated  weekly during [facility] 30-day Program Outreach calls  . Any necessary  changes and  updates to Care Plan will be completed episodically    Reviewed goals for care Patient verbalizes understanding of instructions and care plan provided. Patient was encouraged to make informed decisions about their care, actively participate in managing their health condition, and implement lifestyle changes as needed to promote independence and self-management of health care   Patient was encouraged to Contact PCP with any changes in baseline or  medication regimen,  changes in health status  /  well-being, safety concerns, including falls any questions or concerns regarding ongoing medical care, any difficulty obtaining or picking up prescriptions, any changes or worsening in condition- including  symptoms not relieved  with interventions    The patient has been provided with contact information for the care management team and has been advised to call with any health-related questions or concerns. Follow up as indicated with Care Team , or sooner should any new problems arise.   Susa Loffler , BSN, RN [facility]   VBCI-Population Health RN Care Manager Direct Dial [phone]  Fax: [phone] Website: [email or web address]

## 2023-12-26 ENCOUNTER — Ambulatory Visit (INDEPENDENT_AMBULATORY_CARE_PROVIDER_SITE_OTHER): Admitting: Gastroenterology

## 2023-12-26 ENCOUNTER — Encounter (INDEPENDENT_AMBULATORY_CARE_PROVIDER_SITE_OTHER): Payer: Self-pay | Admitting: Gastroenterology

## 2023-12-26 VITALS — BP 127/67 | HR 91 | Temp 97.3°F | Ht 65.0 in | Wt 214.2 lb

## 2023-12-26 DIAGNOSIS — A4181 Sepsis due to Enterococcus: Secondary | ICD-10-CM | POA: Diagnosis not present

## 2023-12-26 DIAGNOSIS — Z950 Presence of cardiac pacemaker: Secondary | ICD-10-CM | POA: Diagnosis not present

## 2023-12-26 DIAGNOSIS — M1990 Unspecified osteoarthritis, unspecified site: Secondary | ICD-10-CM | POA: Diagnosis not present

## 2023-12-26 DIAGNOSIS — J189 Pneumonia, unspecified organism: Secondary | ICD-10-CM | POA: Diagnosis not present

## 2023-12-26 DIAGNOSIS — R739 Hyperglycemia, unspecified: Secondary | ICD-10-CM | POA: Diagnosis not present

## 2023-12-26 DIAGNOSIS — Z951 Presence of aortocoronary bypass graft: Secondary | ICD-10-CM | POA: Diagnosis not present

## 2023-12-26 DIAGNOSIS — M109 Gout, unspecified: Secondary | ICD-10-CM | POA: Diagnosis not present

## 2023-12-26 DIAGNOSIS — I071 Rheumatic tricuspid insufficiency: Secondary | ICD-10-CM | POA: Diagnosis not present

## 2023-12-26 DIAGNOSIS — Z952 Presence of prosthetic heart valve: Secondary | ICD-10-CM | POA: Diagnosis not present

## 2023-12-26 DIAGNOSIS — K5900 Constipation, unspecified: Secondary | ICD-10-CM | POA: Diagnosis not present

## 2023-12-26 DIAGNOSIS — K219 Gastro-esophageal reflux disease without esophagitis: Secondary | ICD-10-CM | POA: Diagnosis not present

## 2023-12-26 DIAGNOSIS — K649 Unspecified hemorrhoids: Secondary | ICD-10-CM | POA: Diagnosis not present

## 2023-12-26 DIAGNOSIS — I5021 Acute systolic (congestive) heart failure: Secondary | ICD-10-CM | POA: Diagnosis not present

## 2023-12-26 DIAGNOSIS — I2510 Atherosclerotic heart disease of native coronary artery without angina pectoris: Secondary | ICD-10-CM | POA: Diagnosis not present

## 2023-12-26 DIAGNOSIS — R1312 Dysphagia, oropharyngeal phase: Secondary | ICD-10-CM | POA: Diagnosis not present

## 2023-12-26 DIAGNOSIS — N179 Acute kidney failure, unspecified: Secondary | ICD-10-CM | POA: Diagnosis not present

## 2023-12-26 DIAGNOSIS — I110 Hypertensive heart disease with heart failure: Secondary | ICD-10-CM | POA: Diagnosis not present

## 2023-12-26 DIAGNOSIS — D500 Iron deficiency anemia secondary to blood loss (chronic): Secondary | ICD-10-CM | POA: Diagnosis not present

## 2023-12-26 DIAGNOSIS — Z9181 History of falling: Secondary | ICD-10-CM | POA: Diagnosis not present

## 2023-12-26 DIAGNOSIS — Z85828 Personal history of other malignant neoplasm of skin: Secondary | ICD-10-CM | POA: Diagnosis not present

## 2023-12-26 DIAGNOSIS — I442 Atrioventricular block, complete: Secondary | ICD-10-CM | POA: Diagnosis not present

## 2023-12-26 DIAGNOSIS — I480 Paroxysmal atrial fibrillation: Secondary | ICD-10-CM | POA: Diagnosis not present

## 2023-12-26 DIAGNOSIS — M797 Fibromyalgia: Secondary | ICD-10-CM | POA: Diagnosis not present

## 2023-12-26 DIAGNOSIS — E785 Hyperlipidemia, unspecified: Secondary | ICD-10-CM | POA: Diagnosis not present

## 2023-12-26 MED ORDER — LINACLOTIDE 145 MCG PO CAPS: 145.0000 ug | ORAL_CAPSULE | ORAL | 3 refills | Status: DC | PRN

## 2023-12-26 NOTE — Patient Instructions (Signed)
 Restart Linzess 145 mcg as needed for constipation Continue preventing recommendations for swallowing difficulties

## 2023-12-26 NOTE — Progress Notes (Signed)
 Katrinka Blazing, M.D. Gastroenterology & Hepatology [facility] Gastroenterology [address] [place], [place] [address]  Primary Care Physician: Carylon Perches, MD [address] [place] [address]  I will communicate my assessment and recommendations to the referring MD via EMR.  Problems: Constipation Hemorrhoids Pharyngoesophageal dysmotility Recent Enterobacter bacteremia  History of Present Illness: Douglas [name] is a 89 y.o. male PMH aortic stenosis and thoracic aortic aneurysm s/p Bental surgery, atrial fibrillation, coronary artery disease status post CABG, fibromyalgia, hypertension, gout, PMR,  who presents for follow up of constipation and hemorrhoids.  The patient was last seen on 12/24/2022. At that time, the patient was scheduled for a barium swallow for chronic dysphagia.  Speech and swallow evaluation performed on 01/01/2023.  Findings are described below:  tongue base weakness with reduced tongue base retraction and epiglottic deflection resulting in min vallecular residue across consistencies and textures and reduced opening of UES with resulting pyriform residue post initial swallow. A repeat/dry swallow was effective in reducing pharyngeal residue, as was implementation of effortful/hard swallow while swallowing puree. Pt with a single episode of trace penetration of thins via straw when taking sequential swallows in attempt to clear the barium tablet from the valleculae. SLP cued the Pt to clear his throat and it was removed. Pt was unable to swallow the barium tablet and had to exectorate it. He reports that he swallows his medications whole with peanut butter at home without incident. Esophageal sweep revealed barium stasis and retrograde movement  to the level of the cervical esophagus (esophagus never completey emptied) suggestive of dysmotility as confirmed by radiologist.   Notably, the patient had a recent hospitalization at [facility] after presenting sepsis due to pneumonia.  Was found to have Enterobacter faecalis bacteremia, had to be transferred to [facility] for TEE.  Per infectious disease, recommended linezolid for 2 weeks. He left the hospital on 12/24/2023.  Patient reports that he noticed that while hospitalized, he had small bowel movements and he was feeling "backed up" as he presented some bloating and abdominal discomfort. States that yesterday he felt he had to have a bowel movement but was having pain in his rectum. States he had dyschezia but no blood in stool. States he took the Linzess yesterday and was able to have 5 bowel movements. He has been taking Linzess as needed, does not want to take it regularly - he needs refills.  States he is still having dysphagia. Takes pills with peanut butter.  The patient denies having any nausea, vomiting, fever, chills, hematochezia, melena, hematemesis, abdominal pain, diarrhea, jaundice, pruritus or weight loss.  Last EGD: 04/2016 Normal esophagus, blood found in lesser curvature due to large AVM. Epinephrine injection and APC used  to achieve hemostasis. 3 clips placed, Normal duodenum.  Past Medical History: Past Medical History:  Diagnosis Date   Acute upper GI bleed 05/18/2016   Aneurysm of thoracic aorta (HCC)    Ascending. 5.7 x 5.5cm - Bentall procedure   Aortic stenosis    Bioprosthetic AVR   Arthritis    Atrial fibrillation (HCC)    Documented on pacer interrogation 6/13   Complete heart block (HCC)    Status post pacemaker placement   Coronary atherosclerosis of native coronary artery    Multivessel - LIMA to LAD, SVG to OM, SVG to PDA   DJD (degenerative joint disease)    Esophageal dysmotility    Essential hypertension    Fibromyalgia  Gout    H/O arteriovenous malformation (AVM)    H/O hiatal hernia    Hemorrhoid    History of esophageal ulcer    History of skin cancer    Hx of adenomatous colonic polyps    Iron deficiency  anemia due to chronic blood loss    Melena    Pacemaker-Medtronic    PMR (polymyalgia rheumatica) (HCC)    SIRS (systemic inflammatory response syndrome) (HCC) 01/16/2015    Past Surgical History: Past Surgical History:  Procedure Laterality Date   AORTIC VALVE REPLACEMENT  01/24/12   APPENDECTOMY     BACK SURGERY     1965 or 1966 lumbar back surgery   BENTALL PROCEDURE  01/24/2012   Procedure: BENTALL PROCEDURE;  Surgeon: Kerin Perna, MD;  Location: [facility] OR;  Service: Open Heart Surgery;  Laterality: N/A;   BILATERAL KNEE ARTHROSCOPY     CATARACT EXTRACTION W/PHACO Left 12/03/2016   Procedure: CATARACT EXTRACTION PHACO AND INTRAOCULAR LENS PLACEMENT LEFT EYE;  Surgeon: Gemma Payor, MD;  Location: [facility] ORS;  Service: Ophthalmology;  Laterality: Left;  CDE:  10.87   CATARACT EXTRACTION W/PHACO Right 12/13/2016   Procedure: CATARACT EXTRACTION PHACO AND INTRAOCULAR LENS PLACEMENT (IOC);  Surgeon: Gemma Payor, MD;  Location: [facility] ORS;  Service: Ophthalmology;  Laterality: Right;  right cde 11.78   CORONARY ARTERY BYPASS GRAFT  01/24/2012   Procedure: CORONARY ARTERY BYPASS GRAFTING (CABG);  Surgeon: Kerin Perna, MD;  Location[facility];  Service: Open Heart Surgery;  Laterality: N/A;   ESOPHAGOGASTRODUODENOSCOPY  06/17/2012   Procedure: ESOPHAGOGASTRODUODENOSCOPY (EGD);  Surgeon: Malissa Hippo, MD;  Location: [facility] ENDO SUITE;  Service: Endoscopy;  Laterality: N/A;  ED   ESOPHAGOGASTRODUODENOSCOPY N/A 05/19/2016   Procedure: ESOPHAGOGASTRODUODENOSCOPY (EGD);  Surgeon: West Bali, MD;  Location: [facility] END[facility] SUITE;  Service: Endoscopy;  Laterality: N/A;   HEMORROIDECTOMY     HERNIA REPAIR     Left inguinal hernia repair     PACEMAKER PLACEMENT     Medtronic 4/13 - Dr. Johney Frame   PERMANENT PACEMAKER INSERTION N/A 02/01/2012   Procedure: PERMANENT PACEMAKER INSERTION;  Surgeon: Hillis Range, MD; Medtronic Adapta L model ADDRL 1 (serial number [id number])     TRANSESOPHAGEAL ECHOCARDIOGRAM (CATH LAB) N/A  12/23/2023   Procedure: TRANSESOPHAGEAL ECHOCARDIOGRAM;  Surgeon: Jodelle Red, MD;  Location: [facility] INVASIVE CV LAB;  Service: Cardiovascular;  Laterality: N/A;    Family History: Family History  Problem Relation Age of Onset   Kidney disease Mother    Heart disease Father    Anesthesia problems Neg Hx    Hypotension Neg Hx    Malignant hyperthermia Neg Hx    Pseudochol deficiency Neg Hx     Social History: Social History   Tobacco Use  Smoking Status Never  Smokeless Tobacco Never   Social History   Substance and Sexual Activity  Alcohol Use No   Alcohol/week: 0.0 standard drinks of alcohol   Social History   Substance and Sexual Activity  Drug Use No    Allergies: Allergies  Allergen Reactions   Imdur [Isosorbide Nitrate] Other (See Comments)    Nausea and lightheaded     Medications: Current Outpatient Medications  Medication Sig Dispense Refill   acetaminophen (TYLENOL) 500 MG tablet Take 1,000 mg by mouth every 6 (six) hours as needed for mild pain.     allopurinol (ZYLOPRIM) 300 MG tablet Take 300 mg by mouth daily. Take 1  daily     diclofenac Sodium (VOLTAREN) 1 %  GEL Apply 2 g topically 4 (four) times daily. Apply to knees 50 g 2   furosemide (LASIX) 20 MG tablet Take 20 mg by mouth every other day.     linezolid (ZYVOX) 600 MG tablet Take 1 tablet (600 mg total) by mouth 2 (two) times daily for 13 days. 26 tablet 0   LINZESS 145 MCG CAPS capsule TAKE 1 CAPSULE ONCE DAILY BEFORE BREAKFAST. 90 capsule 0   metoprolol succinate (TOPROL-XL) 25 MG 24 hr tablet TAKE (2) TABLETS BY MOUTH DAILY. 180 tablet 1   pantoprazole (PROTONIX) 40 MG tablet TAKE ONE TABLET BY MOUTH ONCE DAILY IN THE MORNING. 30 tablet 6   pravastatin (PRAVACHOL) 20 MG tablet Take 20 mg by mouth every evening.   11   sennosides-docusate sodium (SENOKOT-S) 8.6-50 MG tablet Take 1 tablet by mouth 2 (two) times daily.     warfarin (COUMADIN) 5 MG tablet TAKE 1 TABLET BY MOUTH ONCE DAILY  OR AS DIRECTED. (Patient taking differently: Take 5 mg by mouth daily. Or as directed) 90 tablet 1   No current facility-administered medications for this visit.    Review of Systems: GENERAL: negative for malaise, night sweats HEENT: No changes in hearing or vision, no nose bleeds or other nasal problems. NECK: Negative for lumps, goiter, pain and significant neck swelling RESPIRATORY: Negative for cough, wheezing CARDIOVASCULAR: Negative for chest pain, leg swelling, palpitations, orthopnea GI: SEE HPI MUSCULOSKELETAL: Negative for joint pain or swelling, back pain, and muscle pain. SKIN: Negative for lesions, rash PSYCH: Negative for sleep disturbance, mood disorder and recent psychosocial stressors. HEMATOLOGY Negative for prolonged bleeding, bruising easily, and swollen nodes. ENDOCRINE: Negative for cold or heat intolerance, polyuria, polydipsia and goiter. NEURO: negative for tremor, gait imbalance, syncope and seizures. The remainder of the review of systems is noncontributory.   Physical Exam: BP 127/67 (BP Location: Left Arm, Patient Position: Sitting, Cuff Size: Large)   Pulse 91   Temp (!) 97.3 F (36.3 C) (Temporal)   Ht 5\' 5"  (1.651 m)   Wt 214 lb 3.2 oz (97.2 kg)   BMI 35.64 kg/m  GENERAL: The patient is AO x3, in no acute distress. HEENT: Head is normocephalic and atraumatic. EOMI are intact. Mouth is well hydrated and without lesions. NECK: Supple. No masses LUNGS: Clear to auscultation. No presence of rhonchi/wheezing/rales. Adequate chest expansion HEART: RRR, normal s1 and s2. ABDOMEN: Soft, nontender, no guarding, no peritoneal signs, and nondistended. BS +. No masses. EXTREMITIES: Without any cyanosis, clubbing, rash, lesions or edema. NEUROLOGIC: AOx3, no focal motor deficit. SKIN: no jaundice, no rashes  Imaging/Labs: as above  I personally reviewed and interpreted the available labs, imaging and endoscopic files.  Impression and Plan: BODIN GORKA is a 89 y.o. male PMH aortic stenosis and thoracic aortic aneurysm s/p Bental surgery, atrial fibrillation, coronary artery disease status post CABG, fibromyalgia, hypertension, gout, PMR,  who presents for follow up of constipation and hemorrhoids.  Patient had presented improvement of his constipation while taking Linzess as needed in the past.  Unfortunately, during his most recent hospitalization he was not taking this frequently and his constipation worsened, possibly related to poor mobilization.  Fortunately, he has recovered from his most recent illness and is doing better.  He will need a refill of Linzess to take as needed for now.  Regarding his dysphagia, he has had significant oropharyngeal dysphagia, which has been partially managed with the use of swallowing techniques.  Speech and swallow.  Patient will continue  with these techniques for now.  -Restart Linzess 145 mcg as needed for constipation -Continue preventing recommendations for swallowing difficulties   All questions were answered.      Katrinka Blazing, MD Gastroenterology and Hepatology [facility] Gastroenterology

## 2023-12-28 DIAGNOSIS — I5021 Acute systolic (congestive) heart failure: Secondary | ICD-10-CM | POA: Diagnosis not present

## 2023-12-28 DIAGNOSIS — I2510 Atherosclerotic heart disease of native coronary artery without angina pectoris: Secondary | ICD-10-CM | POA: Diagnosis not present

## 2023-12-28 DIAGNOSIS — A4181 Sepsis due to Enterococcus: Secondary | ICD-10-CM | POA: Diagnosis not present

## 2023-12-28 DIAGNOSIS — I480 Paroxysmal atrial fibrillation: Secondary | ICD-10-CM | POA: Diagnosis not present

## 2023-12-28 DIAGNOSIS — I110 Hypertensive heart disease with heart failure: Secondary | ICD-10-CM | POA: Diagnosis not present

## 2023-12-28 DIAGNOSIS — J189 Pneumonia, unspecified organism: Secondary | ICD-10-CM | POA: Diagnosis not present

## 2023-12-28 LAB — CULTURE, BLOOD (ROUTINE X 2)
Culture: NO GROWTH
Culture: NO GROWTH

## 2023-12-30 ENCOUNTER — Ambulatory Visit: Attending: Cardiology | Admitting: *Deleted

## 2023-12-30 DIAGNOSIS — Z5181 Encounter for therapeutic drug level monitoring: Secondary | ICD-10-CM | POA: Diagnosis not present

## 2023-12-30 DIAGNOSIS — I480 Paroxysmal atrial fibrillation: Secondary | ICD-10-CM | POA: Insufficient documentation

## 2023-12-30 DIAGNOSIS — Z952 Presence of prosthetic heart valve: Secondary | ICD-10-CM | POA: Insufficient documentation

## 2023-12-30 LAB — POCT INR: INR: 1.3 — AB (ref 2.0–3.0)

## 2023-12-30 NOTE — Addendum Note (Signed)
 Addended by: Geralyn Flash D on: 12/30/2023 05:14 PM   Modules accepted: Orders

## 2023-12-30 NOTE — Progress Notes (Signed)
 Remote pacemaker transmission.

## 2023-12-30 NOTE — Patient Instructions (Signed)
 On Zyvox 600mg  bid x 13 days. Will finish 3/17 Restart warfarin 1 tablet daily except 1/2 tablet on Sundays Recheck in 6 weeks Continue greens.

## 2023-12-31 ENCOUNTER — Telehealth: Payer: Self-pay

## 2023-12-31 ENCOUNTER — Other Ambulatory Visit: Payer: Self-pay

## 2023-12-31 DIAGNOSIS — I5021 Acute systolic (congestive) heart failure: Secondary | ICD-10-CM | POA: Diagnosis not present

## 2023-12-31 DIAGNOSIS — I110 Hypertensive heart disease with heart failure: Secondary | ICD-10-CM | POA: Diagnosis not present

## 2023-12-31 DIAGNOSIS — A419 Sepsis, unspecified organism: Secondary | ICD-10-CM | POA: Diagnosis not present

## 2023-12-31 DIAGNOSIS — I2510 Atherosclerotic heart disease of native coronary artery without angina pectoris: Secondary | ICD-10-CM | POA: Diagnosis not present

## 2023-12-31 DIAGNOSIS — J189 Pneumonia, unspecified organism: Secondary | ICD-10-CM | POA: Diagnosis not present

## 2023-12-31 DIAGNOSIS — A4181 Sepsis due to Enterococcus: Secondary | ICD-10-CM | POA: Diagnosis not present

## 2023-12-31 DIAGNOSIS — I480 Paroxysmal atrial fibrillation: Secondary | ICD-10-CM | POA: Diagnosis not present

## 2023-12-31 NOTE — Patient Outreach (Signed)
 Care Management  Transitions of Care Program Transitions of Care Post-discharge week 2   12/31/2023 Name: Douglas [name] MRN: [id number] DOB: 06-Apr-1928  Subjective: Douglas [name] is a 89 y.o. year old male who is a primary care patient of Carylon Perches, MD. The Care Management team Engaged with patient Engaged with patient by telephone to assess and address transitions of care needs.   Consent to Services:  Patient was given information about care management services, agreed to services, and gave verbal consent to participate.   Assessment:     Very pleasant patient reports he is doing better since his hospital discharge. Patient denies further swallowing issues and states he had follow up with coumadin clinic and is taking coumadin as directed, states he spoke with PCP on phone today, confirms 01/31/24 appointment with infectious disease and 4/22 consult. Patient reporting Home health has been to home 3 times and that he is up and walking around the home and feels he's doing well. Patient agreed to ongoing TOC follow up       SDOH Interventions    Flowsheet Row Telephone from 12/25/2023 in [place] POPULATION HEALTH DEPARTMENT  SDOH Interventions   Food Insecurity Interventions Intervention Not Indicated  Housing Interventions Intervention Not Indicated  Transportation Interventions Intervention Not Indicated, Patient Resources (Friends/Family)  Utilities Interventions Intervention Not Indicated        Goals Addressed             This Visit's Progress    TOC Care Plan       Current Barriers:  Medication management Coumadin  - 12/31/23 Patient seen at coumadin clinic yesterday and is taking Coumadin as directed Provider appointments PCP Dr Ouida Sills 12/31/23 - during 12/31/23 TOC call, patient reports he had phone visit with PCP Home Health services [facility] [phone] - 12/31/23 patient reports Home health has been to home 3 times  RNCM Clinical Goal(s):  Patient will work with the  Care Management team over the next 30 days to address Transition of Care Barriers: Medication Management 12/31/23 Ongoing 30 day weekly calls for [facility]  Support at home  Provider appointments Home Health services take all medications exactly as prescribed and will call provider for medication related questions as evidenced by no missed medication doses - 12/31/23 Reviewed medications with patient  attend all scheduled medical appointments: with PCP Lab monitoring for Coumadin  as evidenced by no missed follow-up visits - 12/31/23 patient confirmed coumadin clinic appointment 12/30/23 and next appointment is 01/06/24 demonstrate ongoing self health care management ability  as evidenced by decreased ER visits and hospital admissions  through collaboration with RN Care manager, provider, and care team.  12/31/23 Ongoing  Interventions: Evaluation of current treatment plan related to  self management and patient's adherence to plan as established by provider  Transitions of Care:  Goal on track:  Yes. Doctor Visits  - discussed the importance of doctor visits - 12/31/23: reviewed 01/06/24 coumadin clinic, 01/21/24 Infectious disease appointment with [name] MD and provided address to patient, confirmed 4/22 consult appointment related to nosebleeds and recent difficulty swallowing, which patient reports has resolved.  Contacted Health RN/OT/PT Rolene Arbour for [facility] date - 12/31/23 Patient confirmed Home Health has been to home 3 times Reviewed daily weights and patient reported the day he was discharged he weighed 208lbs and this morning weighed 199.9lbs - reports swelling in legs is improving and he elevates legs when sitting   AFIB Interventions: (Status:  Goal on track:  Yes.)  Short Term Goal   Reviewed importance of adherence to anticoagulant exactly as prescribed 12/31/23 patient was seen at coumadin clinic 12/30/23 with next appointment 01/06/24 and reports taking Coumadin as directed *Counseled on bleeding risk  associated with Coumadin  and importance of self-monitoring for signs/symptoms of bleeding - 12/31/23 Reinforced bleeding risk - patient states he started coumadin 12 years ago after valve replacement - understands risk *Counseled on seeking medical attention after a head injury or if there is blood in the urine/stool 12/31/23 reinforced    Patient Goals/Self-Care Activities: 12/31/23 Ongoing with 30 day TOC weekly calls  Participate in Transition of Care Program/Attend TOC scheduled calls Take all medications as prescribed Attend all scheduled provider appointments Call pharmacy for medication refills 3-7 days in advance of running out of medications Perform all self care activities independently  Perform IADL's (shopping, preparing meals, housekeeping, managing finances) independently Call provider office for new concerns or questions   Follow Up Plan:  Telephone follow up appointment with care management team member scheduled for:  01/08/24 @ 10am  The patient has been provided with contact information for the care management team and has been advised to call with any health related questions or concerns.          Plan: Telephone follow up appointment with care management team member scheduled for: 01/08/24 10am The patient has been provided with contact information for the care management team and has been advised to call with any health related questions or concerns.   Hilbert Odor RN, CCM [place]  VBCI-Population Health RN Care Manager [phone]

## 2023-12-31 NOTE — Patient Instructions (Signed)
 Visit Information  Thank you for taking time to visit with me today. Please don't hesitate to contact me if I can be of assistance to you before our next scheduled telephone appointment.  Our next appointment is by telephone on 01/08/24 at 10am  Following is a copy of your care plan:   Goals Addressed             This Visit's Progress    TOC Care Plan       Current Barriers:  Medication management Coumadin  - 12/31/23 Patient seen at coumadin clinic yesterday and is taking Coumadin as directed Provider appointments PCP Dr Ouida Sills 12/31/23 - during 12/31/23 TOC call, patient reports he had phone visit with PCP Home Health services [facility] [phone] - 12/31/23 patient reports Home health has been to home 3 times  RNCM Clinical Goal(s):  Patient will work with the Care Management team over the next 30 days to address Transition of Care Barriers: Medication Management 12/31/23 Ongoing 30 day weekly calls for [facility]  Support at home  Provider appointments Home Health services take all medications exactly as prescribed and will call provider for medication related questions as evidenced by no missed medication doses - 12/31/23 Reviewed medications with patient  attend all scheduled medical appointments: with PCP Lab monitoring for Coumadin  as evidenced by no missed follow-up visits - 12/31/23 patient confirmed coumadin clinic appointment 12/30/23 and next appointment is 01/06/24 demonstrate ongoing self health care management ability  as evidenced by decreased ER visits and hospital admissions  through collaboration with RN Care manager, provider, and care team.  12/31/23 Ongoing  Interventions: Evaluation of current treatment plan related to  self management and patient's adherence to plan as established by provider  Transitions of Care:  Goal on track:  Yes. Doctor Visits  - discussed the importance of doctor visits - 12/31/23: reviewed 01/06/24 coumadin clinic, 01/21/24 Infectious disease appointment  with [name] MD and provided address to patient, confirmed 4/22 consult appointment related to nosebleeds and recent difficulty swallowing, which patient reports has resolved.  Contacted Health RN/OT/PT Rolene Arbour for [facility] date - 12/31/23 Patient confirmed Home Health has been to home 3 times Reviewed daily weights and patient reported the day he was discharged he weighed 208lbs and this morning weighed 199.9lbs - reports swelling in legs is improving and he elevates legs when sitting   AFIB Interventions: (Status:  Goal on track:  Yes.) Short Term Goal   Reviewed importance of adherence to anticoagulant exactly as prescribed 12/31/23 patient was seen at coumadin clinic 12/30/23 with next appointment 01/06/24 and reports taking Coumadin as directed *Counseled on bleeding risk associated with Coumadin  and importance of self-monitoring for signs/symptoms of bleeding - 12/31/23 Reinforced bleeding risk - patient states he started coumadin 12 years ago after valve replacement - understands risk *Counseled on seeking medical attention after a head injury or if there is blood in the urine/stool 12/31/23 reinforced    Patient Goals/Self-Care Activities: 12/31/23 Ongoing with 30 day TOC weekly calls  Participate in Transition of Care Program/Attend TOC scheduled calls Take all medications as prescribed Attend all scheduled provider appointments Call pharmacy for medication refills 3-7 days in advance of running out of medications Perform all self care activities independently  Perform IADL's (shopping, preparing meals, housekeeping, managing finances) independently Call provider office for new concerns or questions   Follow Up Plan:  Telephone follow up appointment with care management team member scheduled for:  01/08/24 @ 10am  The patient  has been provided with contact information for the care management team and has been advised to call with any health related questions or concerns.          Patient  verbalizes understanding of instructions and care plan provided today and agrees to view in MyChart. Active MyChart status and patient understanding of how to access instructions and care plan via MyChart confirmed with patient.     Telephone follow up appointment with care management team member scheduled for:01/08/24 10am The patient has been provided with contact information for the care management team and has been advised to call with any health related questions or concerns.   Please call the care guide team at [phone] if you need to cancel or reschedule your appointment.   Please call the Suicide and Crisis Lifeline: 988 call the [place] National Suicide Prevention Lifeline: 613-339-1234 or TTY: 7701257871 TTY 848 147 5836) to talk to a trained counselor call 1-800-273-TALK (toll free, 24 hour hotline) call 911 if you are experiencing a Mental Health or Behavioral Health Crisis or need someone to talk to.  Hilbert Odor RN, CCM [place]  VBCI-Population Health RN Care Manager [phone]

## 2024-01-01 DIAGNOSIS — I110 Hypertensive heart disease with heart failure: Secondary | ICD-10-CM | POA: Diagnosis not present

## 2024-01-01 DIAGNOSIS — I2510 Atherosclerotic heart disease of native coronary artery without angina pectoris: Secondary | ICD-10-CM | POA: Diagnosis not present

## 2024-01-01 DIAGNOSIS — I480 Paroxysmal atrial fibrillation: Secondary | ICD-10-CM | POA: Diagnosis not present

## 2024-01-01 DIAGNOSIS — A4181 Sepsis due to Enterococcus: Secondary | ICD-10-CM | POA: Diagnosis not present

## 2024-01-01 DIAGNOSIS — I5021 Acute systolic (congestive) heart failure: Secondary | ICD-10-CM | POA: Diagnosis not present

## 2024-01-01 DIAGNOSIS — J189 Pneumonia, unspecified organism: Secondary | ICD-10-CM | POA: Diagnosis not present

## 2024-01-05 NOTE — Addendum Note (Signed)
 Addendum  created 01/05/24 0955 by Marcene Duos, MD   Attestation recorded in Intraprocedure, Intraprocedure Attestations filed

## 2024-01-06 ENCOUNTER — Ambulatory Visit: Attending: Cardiology | Admitting: *Deleted

## 2024-01-06 DIAGNOSIS — Z5181 Encounter for therapeutic drug level monitoring: Secondary | ICD-10-CM | POA: Insufficient documentation

## 2024-01-06 DIAGNOSIS — I10 Essential (primary) hypertension: Secondary | ICD-10-CM | POA: Diagnosis not present

## 2024-01-06 DIAGNOSIS — Z79899 Other long term (current) drug therapy: Secondary | ICD-10-CM | POA: Diagnosis not present

## 2024-01-06 DIAGNOSIS — I480 Paroxysmal atrial fibrillation: Secondary | ICD-10-CM | POA: Diagnosis not present

## 2024-01-06 DIAGNOSIS — I482 Chronic atrial fibrillation, unspecified: Secondary | ICD-10-CM | POA: Diagnosis not present

## 2024-01-06 DIAGNOSIS — Z952 Presence of prosthetic heart valve: Secondary | ICD-10-CM | POA: Diagnosis not present

## 2024-01-06 DIAGNOSIS — I5032 Chronic diastolic (congestive) heart failure: Secondary | ICD-10-CM | POA: Diagnosis not present

## 2024-01-06 DIAGNOSIS — D509 Iron deficiency anemia, unspecified: Secondary | ICD-10-CM | POA: Diagnosis not present

## 2024-01-06 LAB — POCT INR: INR: 6.8 — AB (ref 2.0–3.0)

## 2024-01-06 NOTE — Patient Instructions (Addendum)
 Finished Zyvox 600mg  bid this morning Hold warfarin x 4 days then decrease dose to 1/2 tablet daily except 1 tablet on Mondays, Wednesdays and Fridays Pt denies s/s of excessive bruising or bleeding.  Bleeding and fall precautions discussed with pt abd he verbalized understanding. Recheck in 1 week Continue greens.

## 2024-01-07 DIAGNOSIS — R197 Diarrhea, unspecified: Secondary | ICD-10-CM | POA: Diagnosis not present

## 2024-01-08 ENCOUNTER — Other Ambulatory Visit: Payer: Self-pay

## 2024-01-08 ENCOUNTER — Telehealth: Payer: Self-pay

## 2024-01-08 DIAGNOSIS — I480 Paroxysmal atrial fibrillation: Secondary | ICD-10-CM | POA: Diagnosis not present

## 2024-01-08 DIAGNOSIS — I2510 Atherosclerotic heart disease of native coronary artery without angina pectoris: Secondary | ICD-10-CM | POA: Diagnosis not present

## 2024-01-08 DIAGNOSIS — A4181 Sepsis due to Enterococcus: Secondary | ICD-10-CM | POA: Diagnosis not present

## 2024-01-08 DIAGNOSIS — J189 Pneumonia, unspecified organism: Secondary | ICD-10-CM | POA: Diagnosis not present

## 2024-01-08 DIAGNOSIS — I5021 Acute systolic (congestive) heart failure: Secondary | ICD-10-CM | POA: Diagnosis not present

## 2024-01-08 DIAGNOSIS — I110 Hypertensive heart disease with heart failure: Secondary | ICD-10-CM | POA: Diagnosis not present

## 2024-01-08 NOTE — Progress Notes (Signed)
 This encounter was created in error - please disregard.

## 2024-01-08 NOTE — Patient Outreach (Addendum)
 Care Management  Transitions of Care Program Transitions of Care Post-discharge week 3   01/08/2024 Name: Douglas [name] MRN: [id number] DOB: 08-05-1928  Subjective: Douglas [name] is a 89 y.o. year old male who is a primary care patient of Carylon Perches, MD. The Care Management team Engaged with patient Engaged with patient by telephone to assess and address transitions of care needs.   Consent to Services:  Patient was given information about care management services, agreed to services, and gave verbal consent to participate.   Assessment:     Patient reported diarrhea x 1 week and states he was started on Lomotil 01/06/24 and has not had any relief. Patient reports he has been drinking water, gatorade, and pedialyte. Conference call was placed to Dr Alonza Smoker office with request for call back to patient. Patient was given education on bland diet. TOC RN will follow up with patient to discuss outcome of his conversation with Dr Ouida Sills and plan      SDOH Interventions    Flowsheet Row Telephone from 12/25/2023 in [place] POPULATION HEALTH DEPARTMENT  SDOH Interventions   Food Insecurity Interventions Intervention Not Indicated  Housing Interventions Intervention Not Indicated  Transportation Interventions Intervention Not Indicated, Patient Resources (Friends/Family)  Utilities Interventions Intervention Not Indicated        Goals Addressed             This Visit's Progress    TOC Care Plan       Current Barriers:  Medication management Coumadin  - 01/08/24 Patient seen at coumadin clinic 01/06/24 INR was 6.8 - Patient is currently holding Coumadin as directed and understands how to restart Provider appointments PCP Dr Ouida Sills office called 01/08/24 by [facility] RN related to patient reporting worsening diarrhea not being helped by new Lomotil Home Health services [facility] [phone] - 01/08/24 not reviewed due to concern with diarrhea  RNCM Clinical Goal(s):  Patient will work with the  Care Management team over the next 30 days to address Transition of Care Barriers: Medication Management 01/08/24 Ongoing 30 day weekly calls for [facility]  Support at home  Provider appointments Home Health services take all medications exactly as prescribed and will call provider for medication related questions as evidenced by no missed medication doses - 01/08/24 Reviewed medications with patient - added Lomotil attend all scheduled medical appointments: with PCP Lab monitoring for Coumadin  as evidenced by no missed follow-up visits - Patient confirmed he went to 01/06/24 coumadin clinic and is currently holding coumadin as directed and understands how to restart on 01/10/24 demonstrate ongoing self health care management ability  as evidenced by decreased ER visits and hospital admissions  through collaboration with RN Care manager, provider, and care team.  01/08/24 Ongoing  Interventions: Evaluation of current treatment plan related to  self management and patient's adherence to plan as established by provider  Transitions of Care:  Goal on track:  Yes. Doctor Visits  - discussed the importance of doctor visits - 01/08/24: reviewed 01/06/24 coumadin clinic results - coumadin orders, 01/21/24 Infectious disease appointment with [name] MD and provided address to patient, confirmed 4/22 consult appointment related to nosebleeds and recent difficulty swallowing, which patient reports has resolved.  Contacted Health RN/OT/PT Rolene Arbour for [facility] date - 01/08/24 not reviewed today related addressing diarrhea concern Reviewed daily weights and patient reported the day he was discharged he weighed 208lbs and this morning weighed 199.9lbs - reports swelling in legs is improving and he elevates legs when  sitting - 01/08/24 did not review today related focus on diarrhea Educated patient on bland dietary options for diarrhea after patient reported eating sausage and egg and having coffee for breakfast - provided options  including bananas, rice, applesauce, toast, yogurt, plain pasta, chicken broth   AFIB Interventions: (Status:  Goal on track:  Yes.) Short Term Goal   Reviewed importance of adherence to anticoagulant exactly as prescribed 01/08/24 patient was seen at coumadin clinic 01/06/24 and reports currently holding Coumadin as directed related to INR 6.8 *Counseled on bleeding risk associated with Coumadin and importance of self-monitoring for signs/symptoms of bleeding - 01/08/24 Reinforced bleeding risk related to INR 6.8 -  *Counseled on seeking medical attention after a head injury or if there is blood in the urine/stool 12/31/23 reinforced    Patient Goals/Self-Care Activities: 01/08/24 Ongoing with 30 day TOC weekly calls  Participate in Transition of Care Program/Attend TOC scheduled calls Take all medications as prescribed Attend all scheduled provider appointments Call pharmacy for medication refills 3-7 days in advance of running out of medications Perform all self care activities independently  Perform IADL's (shopping, preparing meals, housekeeping, managing finances) independently Call provider office for new concerns or questions - 01/08/24 patient and [facility] RN made conference call to Dr Alonza Smoker office related to diarrhea   Follow Up Plan:  Telephone follow up appointment with care management team member scheduled for: [facility] RN will follow up with patient to discuss outcome of conversation with Dr Ouida Sills The patient has been provided with contact information for the care management team and has been advised to call with any health related questions or concerns.          Plan: Telephone follow up appointment with care management team member scheduled for: [facility] RN will follow up with patient to discuss outcome of discussion with Dr Ouida Sills The patient has been provided with contact information for the care management team and has been advised to call with any health related questions or concerns.    Hilbert Odor RN, CCM [place]  VBCI-Population Health RN Care Manager [phone]

## 2024-01-08 NOTE — Addendum Note (Signed)
 Addended by: Hilbert Odor A on: 01/08/2024 04:41 PM   Modules accepted: Orders, Level of Service

## 2024-01-08 NOTE — Patient Outreach (Signed)
 Care Management  Transitions of Care Program Transitions of Care Post-discharge  Care Coordination Follow Up call   01/08/2024 Name: Douglas [name] MRN: [id number] DOB: 1928/09/18  Subjective: Douglas [name] is a 89 y.o. year old male who is a primary care patient of Carylon Perches, MD. The Care Management team Engaged with patient Engaged with patient by telephone to assess and address transitions of care needs.   Consent to Services:  Patient was given information about care management services, agreed to services, and gave verbal consent to participate.   Assessment:     TOC RN placed follow up call to patient who states he is starting to feel better and diarrhea has slowed. Patient states he was able to eat chicken soup, crackers, and applesauce. Patient states he is still waiting to hear from PCP office.       SDOH Interventions    Flowsheet Row Telephone from 12/25/2023 in [place] POPULATION HEALTH DEPARTMENT  SDOH Interventions   Food Insecurity Interventions Intervention Not Indicated  Housing Interventions Intervention Not Indicated  Transportation Interventions Intervention Not Indicated, Patient Resources (Friends/Family)  Utilities Interventions Intervention Not Indicated        Goals Addressed             This Visit's Progress    TOC Care Plan       Current Barriers:  Medication management Coumadin  - 01/08/24 Patient seen at coumadin clinic 01/06/24 INR was 6.8 - Patient is currently holding Coumadin as directed and understands how to restart Provider appointments PCP Dr Ouida Sills office called 01/08/24 by [facility] RN related to patient reporting worsening diarrhea not being helped by new Lomotil - 01/08/24 4:40pm follow up call to patient who states the diarrhea has slowed, he ate lunch of chicken soup, crackers, and applesauce and is now feeling better - will follow up with patient again 01/09/24 to reassess Home Health services [facility] [phone] - 01/08/24 not reviewed due  to concern with diarrhea  RNCM Clinical Goal(s):  Patient will work with the Care Management team over the next 30 days to address Transition of Care Barriers: Medication Management 01/08/24 Ongoing 30 day weekly calls for [facility]  Support at home  Provider appointments Home Health services take all medications exactly as prescribed and will call provider for medication related questions as evidenced by no missed medication doses - 01/08/24 Reviewed medications with patient - added Lomotil attend all scheduled medical appointments: with PCP Lab monitoring for Coumadin  as evidenced by no missed follow-up visits - Patient confirmed he went to 01/06/24 coumadin clinic and is currently holding coumadin as directed and understands how to restart on 01/10/24 demonstrate ongoing self health care management ability  as evidenced by decreased ER visits and hospital admissions  through collaboration with RN Care manager, provider, and care team.  01/08/24 Ongoing  Interventions: Evaluation of current treatment plan related to  self management and patient's adherence to plan as established by provider  Transitions of Care:  Goal on track:  Yes. Doctor Visits  - discussed the importance of doctor visits - 01/08/24: reviewed 01/06/24 coumadin clinic results - coumadin orders, 01/21/24 Infectious disease appointment with [name] MD and provided address to patient, confirmed 4/22 consult appointment related to nosebleeds and recent difficulty swallowing, which patient reports has resolved.  Contacted Health RN/OT/PT Rolene Arbour for [facility] date - 01/08/24 not reviewed today related addressing diarrhea concern Reviewed daily weights and patient reported the day he was discharged he weighed 208lbs and  this morning weighed 199.9lbs - reports swelling in legs is improving and he elevates legs when sitting - 01/08/24 did not review today related focus on diarrhea Educated patient on bland dietary options for diarrhea after patient  reported eating sausage and egg and having coffee for breakfast - provided options including bananas, rice, applesauce, toast, yogurt, plain pasta, chicken broth   AFIB Interventions: (Status:  Goal on track:  Yes.) Short Term Goal   Reviewed importance of adherence to anticoagulant exactly as prescribed 01/08/24 patient was seen at coumadin clinic 01/06/24 and reports currently holding Coumadin as directed related to INR 6.8 *Counseled on bleeding risk associated with Coumadin and importance of self-monitoring for signs/symptoms of bleeding - 01/08/24 Reinforced bleeding risk related to INR 6.8 -  *Counseled on seeking medical attention after a head injury or if there is blood in the urine/stool 12/31/23 reinforced    Patient Goals/Self-Care Activities: 01/08/24 Ongoing with 30 day TOC weekly calls  Participate in Transition of Care Program/Attend TOC scheduled calls Take all medications as prescribed Attend all scheduled provider appointments Call pharmacy for medication refills 3-7 days in advance of running out of medications Perform all self care activities independently  Perform IADL's (shopping, preparing meals, housekeeping, managing finances) independently Call provider office for new concerns or questions - 01/08/24 patient and [facility] RN made conference call to Dr Alonza Smoker office related to diarrhea   Follow Up Plan:  Telephone follow up appointment with care management team member scheduled for: [facility] RN will follow up with patient to discuss outcome of conversation with Dr Ouida Sills and will then schedule next call  The patient has been provided with contact information for the care management team and has been advised to call with any health related questions or concerns.          Plan: Telephone follow up appointment with care management team member scheduled for: [facility] RN will follow up again with patient 01/09/24 to reassess and see if he hears from Dr Alonza Smoker office and will then  reschedule The patient has been provided with contact information for the care management team and has been advised to call with any health related questions or concerns.   Hilbert Odor RN, CCM [place]  VBCI-Population Health RN Care Manager [phone]

## 2024-01-09 ENCOUNTER — Telehealth: Payer: Self-pay

## 2024-01-09 NOTE — Patient Outreach (Signed)
 Care Management  Transitions of Care Program Transitions of Care Post-discharge week 3  01/09/2024 Name: Douglas [name] MRN: [id number] DOB: 26-Mar-1928  Subjective: Douglas [name] is a 89 y.o. year old male who is a primary care patient of Carylon Perches, MD. The Care Management team was unable to reach the patient by phone to assess and address transitions of care needs.   Plan: Additional outreach attempts will be made to reach the patient enrolled in the [facility] Program (Post Inpatient/ED Visit).  Hilbert Odor RN, CCM [place]  VBCI-Population Health RN Care Manager [phone]

## 2024-01-09 NOTE — Patient Outreach (Signed)
 Care Management  Transitions of Care Program Transitions of Care Post-discharge week 3 Follow Up Call   01/09/2024 Name: Douglas [name] MRN: [id number] DOB: [birth year]  Subjective: Douglas [name] is a 89 y.o. year old male who is a primary care patient of Carylon Perches, MD. The Care Management team Engaged with patient Engaged with patient by telephone to assess and address transitions of care needs.   Consent to Services:  Patient was given information about care management services, agreed to services, and gave verbal consent to participate.   Assessment:     TOC RN placed follow up call to patient related to his report 01/08/24 of having diarrhea. Today patient reports he has only moved his bowels twice and tolerated egg, toast for breakfast and chicken brother, applesauce for lunch. Patient states he is still not feeling himself but states he just spoke with Dr Alonza Smoker nurse and was told stool test was negative and states the nurse is going to call him back tomorrow, 01/10/24 to check on him. Patient confirmed he has next appointment at the coumadin clinic 01/15/24 and agreed to [facility] RN follow up 01/17/24      SDOH Interventions    Flowsheet Row Telephone from 12/25/2023 in [place] POPULATION HEALTH DEPARTMENT  SDOH Interventions   Food Insecurity Interventions Intervention Not Indicated  Housing Interventions Intervention Not Indicated  Transportation Interventions Intervention Not Indicated, Patient Resources (Friends/Family)  Utilities Interventions Intervention Not Indicated        Goals Addressed             This Visit's Progress    TOC Care Plan       Current Barriers:  Medication management Coumadin  - 01/08/24 Patient seen at coumadin clinic 01/06/24 INR was 6.8 - Patient is currently holding Coumadin as directed and understands how to restart - next appointment 3/26 Provider appointments PCP Dr Ouida Sills office called 01/08/24 by [facility] RN related to patient reporting worsening  diarrhea not being helped by new Lomotil - 01/08/24 4:40pm follow up call to patient who states the diarrhea has slowed, he ate lunch of chicken soup, crackers, and applesauce and is now feeling better - will follow up with patient again 01/09/24 to reassess - Call to patient 01/09/24 - patient states he has only moved his bowels twice today, had an egg and toast for breakfast, chicken broth and applesauce for lunch and states he heard from nurse at Dr Alonza Smoker office that stool test was negative and he states he thinks he is supposed to see Dr Ouida Sills Monday, 01/13/24 and the nurse said she will follow up with him tomorrow, 01/10/24 Home Health services [place] [phone] - 01/08/24 not reviewed due to concern with diarrhea  RNCM Clinical Goal(s):  Patient will work with the Care Management team over the next 30 days to address Transition of Care Barriers: Medication Management 01/08/24 Ongoing 30 day weekly calls for [facility]  Support at home  Provider appointments Home Health services take all medications exactly as prescribed and will call provider for medication related questions as evidenced by no missed medication doses - 01/08/24 Reviewed medications with patient - added Lomotil attend all scheduled medical appointments: with PCP Lab monitoring for Coumadin  as evidenced by no missed follow-up visits - Patient confirmed he went to 01/06/24 coumadin clinic and is currently holding coumadin as directed and understands how to restart on 01/10/24 demonstrate ongoing self health care management ability  as evidenced by decreased ER visits and hospital admissions  through collaboration with RN Care manager, provider, and care team.  01/08/24 Ongoing  Interventions: Evaluation of current treatment plan related to  self management and patient's adherence to plan as established by provider  Transitions of Care:  Goal on track:  Yes. Doctor Visits  - discussed the importance of doctor visits - 01/08/24: reviewed  01/06/24 coumadin clinic results - coumadin orders, 01/21/24 Infectious disease appointment with [name] MD and provided address to patient, confirmed 4/22 consult appointment related to nosebleeds and recent difficulty swallowing, which patient reports has resolved.  Contacted Health RN/OT/PT - Rolene Arbour for [facility] date - 01/08/24 not reviewed today related addressing diarrhea concern Reviewed daily weights and patient reported the day he was discharged he weighed 208lbs and this morning weighed 199.9lbs - reports swelling in legs is improving and he elevates legs when sitting - 01/08/24 did not review today related focus on diarrhea Educated patient on bland dietary options for diarrhea after patient reported eating sausage and egg and having coffee for breakfast - provided options including bananas, rice, applesauce, toast, yogurt, plain pasta, chicken broth 01/08/24   AFIB Interventions: (Status:  Goal on track:  Yes.) Short Term Goal   Reviewed importance of adherence to anticoagulant exactly as prescribed 01/08/24 patient was seen at coumadin clinic 01/06/24 and reports currently holding Coumadin as directed related to INR 6.8 - recheck 01/15/24 *Counseled on bleeding risk associated with Coumadin and importance of self-monitoring for signs/symptoms of bleeding - 01/08/24 Reinforced bleeding risk related to INR 6.8 -  *Counseled on seeking medical attention after a head injury or if there is blood in the urine/stool 12/31/23 reinforced    Patient Goals/Self-Care Activities: 01/08/24 Ongoing with 30 day TOC weekly calls  Participate in Transition of Care Program/Attend TOC scheduled calls Take all medications as prescribed Attend all scheduled provider appointments Call pharmacy for medication refills 3-7 days in advance of running out of medications Perform all self care activities independently  Perform IADL's (shopping, preparing meals, housekeeping, managing finances) independently Call provider office  for new concerns or questions - 01/08/24 patient and [facility] RN made conference call to Dr Alonza Smoker office related to diarrhea (01/09/24 follow up with patient who states nurse from Dr Alonza Smoker office called and stool test was negative, states she said she will call him back tomorrow, 01/10/24 - reported only 2 BM's today 01/09/24)  Follow Up Plan:  [facility] RN will Follow up with patient 01/17/24 9am  The patient has been provided with contact information for the care management team and has been advised to call with any health related questions or concerns.          Plan: Telephone follow up appointment with care management team member scheduled for: 01/17/24 9am The patient has been provided with contact information for the care management team and has been advised to call with any health related questions or concerns.   Hilbert Odor RN, CCM [place]  VBCI-Population Health RN Care Manager [phone]

## 2024-01-09 NOTE — Patient Instructions (Signed)
 Visit Information  Thank you for taking time to visit with me today. Please don't hesitate to contact me if I can be of assistance to you before our next scheduled telephone appointment.  Our next appointment is by telephone on 01/17/24 at 9am  Following is a copy of your care plan:   Goals Addressed             This Visit's Progress    TOC Care Plan       Current Barriers:  Medication management Coumadin  - 01/08/24 Patient seen at coumadin clinic 01/06/24 INR was 6.8 - Patient is currently holding Coumadin as directed and understands how to restart - next appointment 3/26 Provider appointments PCP Dr Ouida Sills office called 01/08/24 by [facility] RN related to patient reporting worsening diarrhea not being helped by new Lomotil - 01/08/24 4:40pm follow up call to patient who states the diarrhea has slowed, he ate lunch of chicken soup, crackers, and applesauce and is now feeling better - will follow up with patient again 01/09/24 to reassess - Call to patient 01/09/24 - patient states he has only moved his bowels twice today, had an egg and toast for breakfast, chicken broth and applesauce for lunch and states he heard from nurse at Dr Alonza Smoker office that stool test was negative and he states he thinks he is supposed to see Dr Ouida Sills Monday, 01/13/24 and the nurse said she will follow up with him tomorrow, 01/10/24 Home Health services [place] [phone] - 01/08/24 not reviewed due to concern with diarrhea  RNCM Clinical Goal(s):  Patient will work with the Care Management team over the next 30 days to address Transition of Care Barriers: Medication Management 01/08/24 Ongoing 30 day weekly calls for [facility]  Support at home  Provider appointments Home Health services take all medications exactly as prescribed and will call provider for medication related questions as evidenced by no missed medication doses - 01/08/24 Reviewed medications with patient - added Lomotil attend all scheduled medical appointments:  with PCP Lab monitoring for Coumadin  as evidenced by no missed follow-up visits - Patient confirmed he went to 01/06/24 coumadin clinic and is currently holding coumadin as directed and understands how to restart on 01/10/24 demonstrate ongoing self health care management ability  as evidenced by decreased ER visits and hospital admissions  through collaboration with RN Care manager, provider, and care team.  01/08/24 Ongoing  Interventions: Evaluation of current treatment plan related to  self management and patient's adherence to plan as established by provider  Transitions of Care:  Goal on track:  Yes. Doctor Visits  - discussed the importance of doctor visits - 01/08/24: reviewed 01/06/24 coumadin clinic results - coumadin orders, 01/21/24 Infectious disease appointment with [name] MD and provided address to patient, confirmed 4/22 consult appointment related to nosebleeds and recent difficulty swallowing, which patient reports has resolved.  Contacted Health RN/OT/PT Rolene Arbour for [facility] date - 01/08/24 not reviewed today related addressing diarrhea concern Reviewed daily weights and patient reported the day he was discharged he weighed 208lbs and this morning weighed 199.9lbs - reports swelling in legs is improving and he elevates legs when sitting - 01/08/24 did not review today related focus on diarrhea Educated patient on bland dietary options for diarrhea after patient reported eating sausage and egg and having coffee for breakfast - provided options including bananas, rice, applesauce, toast, yogurt, plain pasta, chicken broth 01/08/24   AFIB Interventions: (Status:  Goal on track:  Yes.) Short Term Goal  Reviewed importance of adherence to anticoagulant exactly as prescribed 01/08/24 patient was seen at coumadin clinic 01/06/24 and reports currently holding Coumadin as directed related to INR 6.8 - recheck 01/15/24 *Counseled on bleeding risk associated with Coumadin and importance of  self-monitoring for signs/symptoms of bleeding - 01/08/24 Reinforced bleeding risk related to INR 6.8 -  *Counseled on seeking medical attention after a head injury or if there is blood in the urine/stool 12/31/23 reinforced    Patient Goals/Self-Care Activities: 01/08/24 Ongoing with 30 day TOC weekly calls  Participate in Transition of Care Program/Attend TOC scheduled calls Take all medications as prescribed Attend all scheduled provider appointments Call pharmacy for medication refills 3-7 days in advance of running out of medications Perform all self care activities independently  Perform IADL's (shopping, preparing meals, housekeeping, managing finances) independently Call provider office for new concerns or questions - 01/08/24 patient and [facility] RN made conference call to Dr Alonza Smoker office related to diarrhea (01/09/24 follow up with patient who states nurse from Dr Alonza Smoker office called and stool test was negative, states she said she will call him back tomorrow, 01/10/24 - reported only 2 BM's today 01/09/24)  Follow Up Plan:  [facility] RN will Follow up with patient 01/17/24 9am  The patient has been provided with contact information for the care management team and has been advised to call with any health related questions or concerns.          Patient verbalizes understanding of instructions and care plan provided today and agrees to view in MyChart. Active MyChart status and patient understanding of how to access instructions and care plan via MyChart confirmed with patient.     Telephone follow up appointment with care management team member scheduled for:01/17/24 9am The patient has been provided with contact information for the care management team and has been advised to call with any health related questions or concerns.   Please call the care guide team at [phone] if you need to cancel or reschedule your appointment.   Please call the Suicide and Crisis Lifeline: 988 call the [place]  National Suicide Prevention Lifeline: 804-350-3784 or TTY: 570-386-9138 TTY (443)780-1621) to talk to a trained counselor call 1-800-273-TALK (toll free, 24 hour hotline) call 911 if you are experiencing a Mental Health or Behavioral Health Crisis or need someone to talk to.  Hilbert Odor RN, CCM [place]  VBCI-Population Health RN Care Manager [phone]

## 2024-01-10 ENCOUNTER — Encounter (HOSPITAL_COMMUNITY): Payer: Self-pay | Admitting: Emergency Medicine

## 2024-01-10 ENCOUNTER — Inpatient Hospital Stay (HOSPITAL_COMMUNITY)
Admission: EM | Admit: 2024-01-10 | Discharge: 2024-01-16 | DRG: 871 | Disposition: A | Discharge status: Skilled Nursing Facility | Attending: Family Medicine | Admitting: Family Medicine

## 2024-01-10 ENCOUNTER — Other Ambulatory Visit: Payer: Self-pay

## 2024-01-10 ENCOUNTER — Emergency Department (HOSPITAL_COMMUNITY)

## 2024-01-10 ENCOUNTER — Telehealth: Payer: Self-pay

## 2024-01-10 DIAGNOSIS — D649 Anemia, unspecified: Secondary | ICD-10-CM | POA: Diagnosis not present

## 2024-01-10 DIAGNOSIS — D696 Thrombocytopenia, unspecified: Secondary | ICD-10-CM | POA: Diagnosis present

## 2024-01-10 DIAGNOSIS — E872 Acidosis, unspecified: Secondary | ICD-10-CM | POA: Diagnosis present

## 2024-01-10 DIAGNOSIS — T501X6A Underdosing of loop [high-ceiling] diuretics, initial encounter: Secondary | ICD-10-CM | POA: Diagnosis present

## 2024-01-10 DIAGNOSIS — R571 Hypovolemic shock: Secondary | ICD-10-CM | POA: Diagnosis present

## 2024-01-10 DIAGNOSIS — R197 Diarrhea, unspecified: Secondary | ICD-10-CM | POA: Diagnosis present

## 2024-01-10 DIAGNOSIS — R680 Hypothermia, not associated with low environmental temperature: Secondary | ICD-10-CM | POA: Diagnosis not present

## 2024-01-10 DIAGNOSIS — I358 Other nonrheumatic aortic valve disorders: Secondary | ICD-10-CM | POA: Diagnosis present

## 2024-01-10 DIAGNOSIS — Z8719 Personal history of other diseases of the digestive system: Secondary | ICD-10-CM

## 2024-01-10 DIAGNOSIS — E876 Hypokalemia: Secondary | ICD-10-CM | POA: Diagnosis not present

## 2024-01-10 DIAGNOSIS — M797 Fibromyalgia: Secondary | ICD-10-CM | POA: Diagnosis present

## 2024-01-10 DIAGNOSIS — I10 Essential (primary) hypertension: Secondary | ICD-10-CM | POA: Diagnosis present

## 2024-01-10 DIAGNOSIS — R6521 Severe sepsis with septic shock: Secondary | ICD-10-CM | POA: Diagnosis not present

## 2024-01-10 DIAGNOSIS — Z953 Presence of xenogenic heart valve: Secondary | ICD-10-CM

## 2024-01-10 DIAGNOSIS — E860 Dehydration: Secondary | ICD-10-CM | POA: Insufficient documentation

## 2024-01-10 DIAGNOSIS — E871 Hypo-osmolality and hyponatremia: Secondary | ICD-10-CM | POA: Diagnosis not present

## 2024-01-10 DIAGNOSIS — R1312 Dysphagia, oropharyngeal phase: Secondary | ICD-10-CM | POA: Diagnosis present

## 2024-01-10 DIAGNOSIS — I350 Nonrheumatic aortic (valve) stenosis: Secondary | ICD-10-CM | POA: Diagnosis present

## 2024-01-10 DIAGNOSIS — A419 Sepsis, unspecified organism: Principal | ICD-10-CM | POA: Diagnosis present

## 2024-01-10 DIAGNOSIS — Z66 Do not resuscitate: Secondary | ICD-10-CM | POA: Diagnosis present

## 2024-01-10 DIAGNOSIS — R6510 Systemic inflammatory response syndrome (SIRS) of non-infectious origin without acute organ dysfunction: Secondary | ICD-10-CM | POA: Diagnosis not present

## 2024-01-10 DIAGNOSIS — Z841 Family history of disorders of kidney and ureter: Secondary | ICD-10-CM

## 2024-01-10 DIAGNOSIS — N179 Acute kidney failure, unspecified: Secondary | ICD-10-CM | POA: Diagnosis not present

## 2024-01-10 DIAGNOSIS — I5021 Acute systolic (congestive) heart failure: Secondary | ICD-10-CM | POA: Diagnosis present

## 2024-01-10 DIAGNOSIS — I4891 Unspecified atrial fibrillation: Secondary | ICD-10-CM | POA: Diagnosis present

## 2024-01-10 DIAGNOSIS — K219 Gastro-esophageal reflux disease without esophagitis: Secondary | ICD-10-CM | POA: Diagnosis present

## 2024-01-10 DIAGNOSIS — J690 Pneumonitis due to inhalation of food and vomit: Secondary | ICD-10-CM | POA: Diagnosis present

## 2024-01-10 DIAGNOSIS — K5900 Constipation, unspecified: Secondary | ICD-10-CM | POA: Diagnosis present

## 2024-01-10 DIAGNOSIS — I480 Paroxysmal atrial fibrillation: Secondary | ICD-10-CM | POA: Diagnosis present

## 2024-01-10 DIAGNOSIS — Z91148 Patient's other noncompliance with medication regimen for other reason: Secondary | ICD-10-CM

## 2024-01-10 DIAGNOSIS — R1310 Dysphagia, unspecified: Secondary | ICD-10-CM | POA: Diagnosis not present

## 2024-01-10 DIAGNOSIS — K224 Dyskinesia of esophagus: Secondary | ICD-10-CM | POA: Diagnosis not present

## 2024-01-10 DIAGNOSIS — K222 Esophageal obstruction: Secondary | ICD-10-CM | POA: Diagnosis not present

## 2024-01-10 DIAGNOSIS — Z952 Presence of prosthetic heart valve: Secondary | ICD-10-CM | POA: Diagnosis not present

## 2024-01-10 DIAGNOSIS — K5909 Other constipation: Secondary | ICD-10-CM | POA: Diagnosis present

## 2024-01-10 DIAGNOSIS — Z950 Presence of cardiac pacemaker: Secondary | ICD-10-CM | POA: Diagnosis not present

## 2024-01-10 DIAGNOSIS — R935 Abnormal findings on diagnostic imaging of other abdominal regions, including retroperitoneum: Secondary | ICD-10-CM | POA: Diagnosis not present

## 2024-01-10 DIAGNOSIS — J189 Pneumonia, unspecified organism: Secondary | ICD-10-CM | POA: Diagnosis present

## 2024-01-10 DIAGNOSIS — K5730 Diverticulosis of large intestine without perforation or abscess without bleeding: Secondary | ICD-10-CM | POA: Diagnosis not present

## 2024-01-10 DIAGNOSIS — R140 Abdominal distension (gaseous): Secondary | ICD-10-CM | POA: Diagnosis not present

## 2024-01-10 DIAGNOSIS — M1000 Idiopathic gout, unspecified site: Secondary | ICD-10-CM | POA: Diagnosis not present

## 2024-01-10 DIAGNOSIS — R791 Abnormal coagulation profile: Secondary | ICD-10-CM | POA: Diagnosis not present

## 2024-01-10 DIAGNOSIS — M353 Polymyalgia rheumatica: Secondary | ICD-10-CM | POA: Diagnosis not present

## 2024-01-10 DIAGNOSIS — Z7901 Long term (current) use of anticoagulants: Secondary | ICD-10-CM

## 2024-01-10 DIAGNOSIS — Z79899 Other long term (current) drug therapy: Secondary | ICD-10-CM

## 2024-01-10 DIAGNOSIS — R1314 Dysphagia, pharyngoesophageal phase: Secondary | ICD-10-CM | POA: Diagnosis not present

## 2024-01-10 DIAGNOSIS — Z860101 Personal history of adenomatous and serrated colon polyps: Secondary | ICD-10-CM

## 2024-01-10 DIAGNOSIS — R54 Age-related physical debility: Secondary | ICD-10-CM | POA: Diagnosis present

## 2024-01-10 DIAGNOSIS — R918 Other nonspecific abnormal finding of lung field: Secondary | ICD-10-CM | POA: Diagnosis not present

## 2024-01-10 DIAGNOSIS — R262 Difficulty in walking, not elsewhere classified: Secondary | ICD-10-CM | POA: Diagnosis not present

## 2024-01-10 DIAGNOSIS — Z85828 Personal history of other malignant neoplasm of skin: Secondary | ICD-10-CM

## 2024-01-10 DIAGNOSIS — D500 Iron deficiency anemia secondary to blood loss (chronic): Secondary | ICD-10-CM | POA: Diagnosis present

## 2024-01-10 DIAGNOSIS — Z8701 Personal history of pneumonia (recurrent): Secondary | ICD-10-CM

## 2024-01-10 DIAGNOSIS — Z888 Allergy status to other drugs, medicaments and biological substances status: Secondary | ICD-10-CM

## 2024-01-10 DIAGNOSIS — Z951 Presence of aortocoronary bypass graft: Secondary | ICD-10-CM

## 2024-01-10 DIAGNOSIS — K4090 Unilateral inguinal hernia, without obstruction or gangrene, not specified as recurrent: Secondary | ICD-10-CM | POA: Diagnosis not present

## 2024-01-10 DIAGNOSIS — R6520 Severe sepsis without septic shock: Secondary | ICD-10-CM | POA: Diagnosis present

## 2024-01-10 DIAGNOSIS — E861 Hypovolemia: Secondary | ICD-10-CM | POA: Diagnosis present

## 2024-01-10 DIAGNOSIS — I2510 Atherosclerotic heart disease of native coronary artery without angina pectoris: Secondary | ICD-10-CM | POA: Diagnosis present

## 2024-01-10 DIAGNOSIS — M6281 Muscle weakness (generalized): Secondary | ICD-10-CM | POA: Diagnosis not present

## 2024-01-10 DIAGNOSIS — I110 Hypertensive heart disease with heart failure: Secondary | ICD-10-CM | POA: Diagnosis not present

## 2024-01-10 DIAGNOSIS — Z8249 Family history of ischemic heart disease and other diseases of the circulatory system: Secondary | ICD-10-CM

## 2024-01-10 DIAGNOSIS — K869 Disease of pancreas, unspecified: Secondary | ICD-10-CM | POA: Diagnosis not present

## 2024-01-10 DIAGNOSIS — R933 Abnormal findings on diagnostic imaging of other parts of digestive tract: Secondary | ICD-10-CM | POA: Diagnosis not present

## 2024-01-10 DIAGNOSIS — Z961 Presence of intraocular lens: Secondary | ICD-10-CM | POA: Diagnosis not present

## 2024-01-10 LAB — BASIC METABOLIC PANEL
Anion gap: 13 (ref 5–15)
Anion gap: 12 (ref 5–15)
BUN: 55 mg/dL — ABNORMAL HIGH (ref 8–23)
BUN: 56 mg/dL — ABNORMAL HIGH (ref 8–23)
CO2: 10 mmol/L — ABNORMAL LOW (ref 22–32)
CO2: 9 mmol/L — ABNORMAL LOW (ref 22–32)
Calcium: 7.6 mg/dL — ABNORMAL LOW (ref 8.9–10.3)
Calcium: 7.5 mg/dL — ABNORMAL LOW (ref 8.9–10.3)
Chloride: 107 mmol/L (ref 98–111)
Chloride: 108 mmol/L (ref 98–111)
Creatinine, Ser: 4.03 mg/dL — ABNORMAL HIGH (ref 0.61–1.24)
Creatinine, Ser: 3.96 mg/dL — ABNORMAL HIGH (ref 0.61–1.24)
GFR, Estimated: 13 mL/min — ABNORMAL LOW (ref >60)
GFR, Estimated: 13 mL/min — ABNORMAL LOW (ref >60)
Glucose, Bld: 126 mg/dL — ABNORMAL HIGH (ref 70–99)
Glucose, Bld: 122 mg/dL — ABNORMAL HIGH (ref 70–99)
Potassium: 4.1 mmol/L (ref 3.5–5.1)
Potassium: 3.7 mmol/L (ref 3.5–5.1)
Sodium: 130 mmol/L — ABNORMAL LOW (ref 135–145)
Sodium: 129 mmol/L — ABNORMAL LOW (ref 135–145)

## 2024-01-10 LAB — COMPREHENSIVE METABOLIC PANEL
ALT: 10 U/L (ref 0–44)
AST: 14 U/L — ABNORMAL LOW (ref 15–41)
Albumin: 2.9 g/dL — ABNORMAL LOW (ref 3.5–5.0)
Alkaline Phosphatase: 100 U/L (ref 38–126)
Anion gap: 13 (ref 5–15)
BUN: 59 mg/dL — ABNORMAL HIGH (ref 8–23)
CO2: 12 mmol/L — ABNORMAL LOW (ref 22–32)
Calcium: 8.2 mg/dL — ABNORMAL LOW (ref 8.9–10.3)
Chloride: 103 mmol/L (ref 98–111)
Creatinine, Ser: 4.33 mg/dL — ABNORMAL HIGH (ref 0.61–1.24)
GFR, Estimated: 12 mL/min — ABNORMAL LOW (ref >60)
Glucose, Bld: 134 mg/dL — ABNORMAL HIGH (ref 70–99)
Potassium: 3.6 mmol/L (ref 3.5–5.1)
Sodium: 128 mmol/L — ABNORMAL LOW (ref 135–145)
Total Bilirubin: 0.6 mg/dL (ref 0.0–1.2)
Total Protein: 5.9 g/dL — ABNORMAL LOW (ref 6.5–8.1)

## 2024-01-10 LAB — URINALYSIS, W/ REFLEX TO CULTURE (INFECTION SUSPECTED)
Bilirubin Urine: NEGATIVE
Glucose, UA: 50 mg/dL — AB
Ketones, ur: NEGATIVE mg/dL
Leukocytes,Ua: NEGATIVE
Nitrite: NEGATIVE
Protein, ur: 100 mg/dL — AB
Specific Gravity, Urine: 1.018 (ref 1.005–1.030)
pH: 5 (ref 5.0–8.0)

## 2024-01-10 LAB — CBC WITH DIFFERENTIAL/PLATELET
Abs Immature Granulocytes: 0.03 10*3/uL (ref 0.00–0.07)
Basophils Absolute: 0 10*3/uL (ref 0.0–0.1)
Basophils Relative: 0 %
Eosinophils Absolute: 1.6 10*3/uL — ABNORMAL HIGH (ref 0.0–0.5)
Eosinophils Relative: 16 %
HCT: 41.7 % (ref 39.0–52.0)
Hemoglobin: 13.2 g/dL (ref 13.0–17.0)
Immature Granulocytes: 0 %
Lymphocytes Relative: 19 %
Lymphs Abs: 1.9 10*3/uL (ref 0.7–4.0)
MCH: 29.2 pg (ref 26.0–34.0)
MCHC: 31.7 g/dL (ref 30.0–36.0)
MCV: 92.3 fL (ref 80.0–100.0)
Monocytes Absolute: 0.9 10*3/uL (ref 0.1–1.0)
Monocytes Relative: 9 %
Neutro Abs: 5.6 10*3/uL (ref 1.7–7.7)
Neutrophils Relative %: 56 %
Platelets: 117 10*3/uL — ABNORMAL LOW (ref 150–400)
RBC: 4.52 MIL/uL (ref 4.22–5.81)
RDW: 15.3 % (ref 11.5–15.5)
WBC: 9.9 10*3/uL (ref 4.0–10.5)
nRBC: 0 % (ref 0.0–0.2)

## 2024-01-10 LAB — LACTIC ACID, PLASMA
Lactic Acid, Venous: 2.8 mmol/L (ref 0.5–1.9)
Lactic Acid, Venous: 1.6 mmol/L (ref 0.5–1.9)

## 2024-01-10 LAB — C DIFFICILE QUICK SCREEN W PCR REFLEX
C Diff antigen: NEGATIVE
C Diff interpretation: NOT DETECTED
C Diff toxin: NEGATIVE

## 2024-01-10 LAB — PROTIME-INR
INR: 9.8 (ref 0.8–1.2)
Prothrombin Time: 78.7 s — ABNORMAL HIGH (ref 11.4–15.2)

## 2024-01-10 LAB — MRSA NEXT GEN BY PCR, NASAL: MRSA by PCR Next Gen: NOT DETECTED

## 2024-01-10 MED ORDER — METRONIDAZOLE 500 MG/100ML IV SOLN
500.0000 mg | Freq: Two times a day (BID) | INTRAVENOUS | Status: DC
  Administered 2024-01-10 – 2024-01-11 (×3): 500 mg via INTRAVENOUS
  Filled 2024-01-10 (×3): qty 100

## 2024-01-10 MED ORDER — WARFARIN SODIUM 5 MG PO TABS: 5.0000 mg | ORAL_TABLET | Freq: Once | ORAL | Status: DC

## 2024-01-10 MED ORDER — LACTATED RINGERS IV SOLN: INTRAVENOUS | Status: DC

## 2024-01-10 MED ORDER — LACTATED RINGERS IV BOLUS
1000.0000 mL | Freq: Once | INTRAVENOUS | Status: AC
  Administered 2024-01-10: 1000 mL via INTRAVENOUS

## 2024-01-10 MED ORDER — SODIUM CHLORIDE 0.9 % IV SOLN
2.0000 g | INTRAVENOUS | Status: DC
  Administered 2024-01-10 – 2024-01-11 (×2): 2 g via INTRAVENOUS
  Filled 2024-01-10 (×2): qty 20

## 2024-01-10 MED ORDER — SODIUM CHLORIDE 0.9 % IV BOLUS
1000.0000 mL | Freq: Once | INTRAVENOUS | Status: AC
  Administered 2024-01-10: 1000 mL via INTRAVENOUS

## 2024-01-10 MED ORDER — PRAVASTATIN SODIUM 40 MG PO TABS
20.0000 mg | ORAL_TABLET | Freq: Every evening | ORAL | Status: DC
  Administered 2024-01-10 – 2024-01-15 (×6): 20 mg via ORAL
  Filled 2024-01-10: qty 1
  Filled 2024-01-10: qty 2
  Filled 2024-01-10: qty 1
  Filled 2024-01-10 (×3): qty 2

## 2024-01-10 MED ORDER — CALCIUM POLYCARBOPHIL 625 MG PO TABS
625.0000 mg | ORAL_TABLET | Freq: Every day | ORAL | Status: DC
  Administered 2024-01-10 – 2024-01-12 (×3): 625 mg via ORAL
  Filled 2024-01-10 (×6): qty 1

## 2024-01-10 MED ORDER — ONDANSETRON HCL 4 MG PO TABS: 4.0000 mg | ORAL_TABLET | Freq: Four times a day (QID) | ORAL | Status: DC | PRN

## 2024-01-10 MED ORDER — PHENYLEPHRINE HCL-NACL 20-0.9 MG/250ML-% IV SOLN
25.0000 ug/min | INTRAVENOUS | Status: DC
Start: 2024-01-10 — End: 2024-01-13
  Administered 2024-01-10: 25 ug/min via INTRAVENOUS
  Administered 2024-01-11: 45 ug/min via INTRAVENOUS
  Filled 2024-01-10 (×2): qty 250

## 2024-01-10 MED ORDER — ONDANSETRON HCL 4 MG/2ML IJ SOLN
4.0000 mg | Freq: Four times a day (QID) | INTRAMUSCULAR | Status: DC | PRN
Start: 2024-01-10 — End: 2024-01-16

## 2024-01-10 MED ORDER — LACTATED RINGERS IV BOLUS: 500.0000 mL | Freq: Once | INTRAVENOUS | Status: DC

## 2024-01-10 MED ORDER — LACTATED RINGERS IV BOLUS
700.0000 mL | Freq: Once | INTRAVENOUS | Status: AC
Start: 2024-01-10 — End: 2024-01-10
  Administered 2024-01-10: 700 mL via INTRAVENOUS

## 2024-01-10 MED ORDER — PHENYLEPHRINE HCL-NACL 20-0.9 MG/250ML-% IV SOLN: 0.0000 ug/min | INTRAVENOUS | Status: DC

## 2024-01-10 MED ORDER — SODIUM CHLORIDE 0.9 % IV BOLUS
500.0000 mL | Freq: Once | INTRAVENOUS | Status: AC
  Administered 2024-01-10: 500 mL via INTRAVENOUS

## 2024-01-10 MED ORDER — ENSURE ENLIVE PO LIQD
237.0000 mL | Freq: Two times a day (BID) | ORAL | Status: DC
  Administered 2024-01-10 – 2024-01-14 (×7): 237 mL via ORAL

## 2024-01-10 MED ORDER — PHYTONADIONE 5 MG PO TABS
5.0000 mg | ORAL_TABLET | Freq: Once | ORAL | Status: AC
  Administered 2024-01-10: 5 mg via ORAL
  Filled 2024-01-10: qty 1

## 2024-01-10 MED ORDER — LACTATED RINGERS IV BOLUS
500.0000 mL | Freq: Once | INTRAVENOUS | Status: AC
  Administered 2024-01-10: 500 mL via INTRAVENOUS

## 2024-01-10 MED ORDER — CHLORHEXIDINE GLUCONATE CLOTH 2 % EX PADS
6.0000 | MEDICATED_PAD | Freq: Every day | CUTANEOUS | Status: DC
  Administered 2024-01-11 – 2024-01-16 (×6): 6 via TOPICAL

## 2024-01-10 MED ORDER — PANTOPRAZOLE SODIUM 40 MG IV SOLR
40.0000 mg | Freq: Two times a day (BID) | INTRAVENOUS | Status: DC
  Administered 2024-01-10 – 2024-01-16 (×12): 40 mg via INTRAVENOUS
  Filled 2024-01-10 (×12): qty 10

## 2024-01-10 MED ORDER — LOPERAMIDE HCL 2 MG PO CAPS
2.0000 mg | ORAL_CAPSULE | ORAL | Status: DC | PRN
  Administered 2024-01-10 (×2): 2 mg via ORAL
  Filled 2024-01-10 (×2): qty 1

## 2024-01-10 MED ORDER — SODIUM CHLORIDE 0.9 % IV BOLUS
500.0000 mL | Freq: Once | INTRAVENOUS | Status: DC
Start: 2024-01-10 — End: 2024-01-10

## 2024-01-10 NOTE — ED Triage Notes (Signed)
 Pt bib pov from home w/ c/o diarrhea, weakness, abdominal pain and a "sour stomach." Pt [facility] with p[facility] and sepsis. Pt started having diarrhea and it was thought to be from the abx. Pt was still tested for cdiff and that was negative. Abx has finished and diarrhea has not stopped. Pt bp is soft and pt is displaying fatigue.

## 2024-01-10 NOTE — TOC Initial Note (Signed)
 Transition of Care [facility]) - Initial/Assessment Note    Patient Details  Name: Douglas [name] MRN: [id number] Date of Birth: Jul 14, 1928  Transition of Care [facility] CM/SW Contact:    Barron Alvine, RN Phone Number: 01/10/2024, 8:25 PM  Clinical Narrative:                 Pt is active c/[facility] RN & PT. Has walker. Plans to return home c/wife. Dtr currently visiting from [place], [place].   Expected Discharge Plan: Home w Home Health Services Barriers to Discharge: Continued Medical Work up   Patient Goals and CMS Choice Patient states their goals for this hospitalization and ongoing recovery are:: To go home, rest, get strength back.          Expected Discharge Plan and Services   Discharge Planning Services: CM Consult Post Acute Care Choice: Home Health Living arrangements for the past 2 months: Single Family Home                             HH Agency: [facility]        Prior Living Arrangements/Services Living arrangements for the past 2 months: Single Family Home Lives with:: Spouse Patient language and need for interpreter reviewed:: Yes Do you feel safe going back to the place where you live?: Yes      Need for Family Participation in Patient Care: Yes (Comment) Care giver support system in place?: Yes (comment) Current home services: Home RN, Home PT Criminal Activity/Legal Involvement Pertinent to Current Situation/Hospitalization: No - Comment as needed  Activities of Daily Living   ADL Screening (condition at time of admission) Independently performs ADLs?: Yes (appropriate for developmental age) Is the patient deaf or have difficulty hearing?: No Does the patient have difficulty seeing, even when wearing glasses/contacts?: No Does the patient have difficulty concentrating, remembering, or making decisions?: No  Permission Sought/Granted                  Emotional Assessment Appearance:: Well-Groomed Attitude/Demeanor/Rapport:  Engaged Affect (typically observed): Appropriate, Pleasant Orientation: : Oriented to Self, Oriented to Place, Oriented to  Time, Oriented to Situation   Psych Involvement: No (comment)  Admission diagnosis:  SIRS (systemic inflammatory response syndrome) (HCC) [R65.10] AKI (acute kidney injury) (HCC) [N17.9] Sepsis (HCC) [A41.9] Patient Active Problem List   Diagnosis Date Noted   Sepsis (HCC) 01/10/2024   Acute hyponatremia 01/10/2024   Dehydration 01/10/2024   Diarrhea 01/10/2024   Supratherapeutic INR 01/10/2024   Acute bacterial endocarditis 12/23/2023   Bacteremia 12/21/2023   Acute systolic (congestive) heart failure (HCC) 12/21/2023   Pressure injury of skin 12/21/2023   CAP (community acquired pneumonia) 12/19/2023   AKI (acute kidney injury) (HCC) 12/19/2023   Leukocytosis 12/19/2023   Lactic acidosis 12/19/2023   Generalized weakness 12/19/2023   Hyperglycemia 12/19/2023   Oropharyngeal dysphagia 12/24/2022   Regurgitation of food 12/24/2022   Hemorrhoid 12/11/2017   Constipation 12/11/2017   Iron deficiency anemia due to chronic blood loss 10/03/2016   Symptomatic anemia    Hypotension    History of esophageal ulcer    H/O arteriovenous malformation (AVM)    Chronic anticoagulation - Coumadin, CHADS2VASC=5    UTI (urinary tract infection) 01/16/2015   SIRS (systemic inflammatory response syndrome) (HCC) 01/16/2015   Precordial pain 06/15/2014   Aortic valve replaced 08/25/2012   Pacemaker-Medtronic 07/29/2012   Atrial fibrillation (HCC) 02/11/2012   CHB (complete heart  block) (HCC) 01/31/2012   Aneurysm of thoracic aorta (HCC)    Coronary atherosclerosis of native coronary artery 05/10/2011   Essential hypertension, benign    Aortic stenosis    PCP:  Carylon Perches, MD Pharmacy:   [facility] [place], [place] - [id number] [place] [address] Phone: [phone] Fax: [phone]  [facility] Transitions [facility] Care  Pharmacy [address] [place] [address] Phone: [phone] Fax: [phone]     Social Drivers of Health (SDOH) Social History: SDOH Screenings   Food Insecurity: No Food Insecurity (01/10/2024)  Housing: Low Risk  (01/10/2024)  Transportation Needs: No Transportation Needs (01/10/2024)  Utilities: Not At Risk (01/10/2024)  Social Connections: Moderately Isolated (01/10/2024)  Tobacco Use: Low Risk  (01/10/2024)   SDOH Interventions:     Readmission Risk Interventions    01/10/2024    8:19 PM 12/19/2023    3:34 PM  Readmission Risk Prevention Plan  Transportation Screening Complete Complete  PCP or Specialist Appt within 3-5 Days Complete   Home Care Screening  Complete  Medication Review (RN CM)  Complete  HRI or Home Care Consult Complete   Social Work Consult for Recovery Care Planning/Counseling Complete   Palliative Care Screening Not Applicable   Medication Review Oceanographer) Complete

## 2024-01-10 NOTE — ED Provider Notes (Signed)
 [facility] Provider Note   CSN: [id number] Arrival date & time: 01/10/24  4132     History  Chief Complaint  Patient presents with   Diarrhea   Weakness    Douglas [name] is a 89 y.o. male with past medical history of CAD, aortic valve replacement, A-fib on Coumadin, CHF, history of AVMs and esophageal ulcer.    He was recently admitted to the hospital for community-acquired pneumonia with sepsis and found to have bacteremia caused by Enterobacter faecalis.  He was discharged on December 24, 2023.  Has had ongoing diarrhea despite being finishes antibiotics.  Had negative C. difficile testing.  Presents ER today complaining of diarrhea, generalized weakness and weight loss.  He reports decrease urinary output. He has not taken his lasix in the last 4 days due to this. He comes from home.   Diarrhea Weakness Associated symptoms: diarrhea        Home Medications Prior to Admission medications   Medication Sig Start Date End Date Taking? Authorizing Provider  acetaminophen (TYLENOL) 500 MG tablet Take 1,000 mg by mouth every 6 (six) hours as needed for mild pain.    [provider]  allopurinol (ZYLOPRIM) 300 MG tablet Take 300 mg by mouth daily. Take 1  daily    [provider]  diclofenac Sodium (VOLTAREN) 1 % GEL Apply 2 g topically 4 (four) times daily. Apply to knees Patient not taking: Reported on 01/08/2024 12/24/23   Rai, Delene Ruffini, MD  diphenoxylate-atropine (LOMOTIL) 2.5-0.025 MG tablet Take 1 tablet by mouth 4 (four) times daily as needed for diarrhea or loose stools. 01/06/24   [provider]  furosemide (LASIX) 20 MG tablet Take 20 mg by mouth every other day. Patient not taking: Reported on 01/08/2024 11/25/23   [provider]  linaclotide Karlene Einstein) 145 MCG CAPS capsule Take 1 capsule (145 mcg total) by mouth as needed (constipation). Patient not taking: Reported on 01/08/2024 12/26/23    Marguerita Merles, Reuel Boom, MD  metoprolol succinate (TOPROL-XL) 25 MG 24 hr tablet TAKE (2) TABLETS BY MOUTH DAILY. 10/17/23   Jonelle Sidle, MD  pantoprazole (PROTONIX) 40 MG tablet TAKE ONE TABLET BY MOUTH ONCE DAILY IN THE MORNING. 11/24/15   Jonelle Sidle, MD  pravastatin (PRAVACHOL) 20 MG tablet Take 20 mg by mouth every evening.  10/27/15   [provider]  sennosides-docusate sodium (SENOKOT-S) 8.6-50 MG tablet Take 1 tablet by mouth 2 (two) times daily. Patient not taking: Reported on 01/08/2024 12/30/17   Malissa Hippo, MD  warfarin (COUMADIN) 5 MG tablet TAKE 1 TABLET BY MOUTH ONCE DAILY OR AS DIRECTED. Patient not taking: Reported on 01/08/2024 10/01/23   Jonelle Sidle, MD      Allergies    Imdur [isosorbide nitrate]    Review of Systems   Review of Systems  Gastrointestinal:  Positive for diarrhea.  Neurological:  Positive for weakness.    Physical Exam Updated Vital Signs BP (!) 94/33   Pulse 72   Temp (!) 95.9 F (35.5 C) (Rectal)   Resp 19   Ht 5\' 5"  (1.651 m)   Wt 97 kg   SpO2 100%   BMI 35.59 kg/m  Physical Exam Vitals and nursing note reviewed.  Constitutional:      General: He is not in acute distress.    Appearance: He is well-developed. He is ill-appearing.  HENT:     Head: Normocephalic and atraumatic.  Nose: Nose normal.     Mouth/Throat:     Mouth: Mucous membranes are dry.  Eyes:     Extraocular Movements: Extraocular movements intact.     Conjunctiva/sclera: Conjunctivae normal.     Pupils: Pupils are equal, round, and reactive to light.  Cardiovascular:     Rate and Rhythm: Normal rate and regular rhythm.     Heart sounds: No murmur heard. Pulmonary:     Effort: Pulmonary effort is normal. No respiratory distress.     Breath sounds: Normal breath sounds. No wheezing or rales.  Abdominal:     Palpations: Abdomen is soft.     Tenderness: There is no abdominal tenderness. There is no guarding or rebound.   Musculoskeletal:        General: No swelling.     Cervical back: Neck supple.  Skin:    General: Skin is warm and dry.     Capillary Refill: Capillary refill takes less than 2 seconds.  Neurological:     General: No focal deficit present.     Mental Status: He is alert and oriented to person, place, and time.  Psychiatric:        Mood and Affect: Mood normal.     ED Results / Procedures / Treatments   Labs (all labs ordered are listed, but only abnormal results are displayed) Labs Reviewed  COMPREHENSIVE METABOLIC PANEL - Abnormal; Notable for the following components:      Result Value   Sodium 128 (*)    CO2 12 (*)    Glucose, Bld 134 (*)    BUN 59 (*)    Creatinine, Ser 4.33 (*)    Calcium 8.2 (*)    Total Protein 5.9 (*)    Albumin 2.9 (*)    AST 14 (*)    GFR, Estimated 12 (*)    All other components within normal limits  LACTIC ACID, PLASMA - Abnormal; Notable for the following components:   Lactic Acid, Venous 2.8 (*)    All other components within normal limits  CBC WITH DIFFERENTIAL/PLATELET - Abnormal; Notable for the following components:   Platelets 117 (*)    Eosinophils Absolute 1.6 (*)    All other components within normal limits  PROTIME-INR - Abnormal; Notable for the following components:   Prothrombin Time 78.7 (*)    INR 9.8 (*)    All other components within normal limits  URINALYSIS, W/ REFLEX TO CULTURE (INFECTION SUSPECTED) - Abnormal; Notable for the following components:   Color, Urine AMBER (*)    APPearance CLOUDY (*)    Glucose, UA 50 (*)    Hgb urine dipstick MODERATE (*)    Protein, ur 100 (*)    Bacteria, UA RARE (*)    All other components within normal limits  C DIFFICILE QUICK SCREEN W PCR REFLEX    CULTURE, BLOOD (ROUTINE X 2)  CULTURE, BLOOD (ROUTINE X 2)  GASTROINTESTINAL PANEL BY PCR, STOOL (REPLACES STOOL CULTURE)  URINE CULTURE  LACTIC ACID, PLASMA  BASIC METABOLIC PANEL  BASIC METABOLIC PANEL  BASIC METABOLIC PANEL   BASIC METABOLIC PANEL  CBC  PROTIME-INR    EKG None  Radiology CT ABDOMEN PELVIS WO CONTRAST Result Date: 01/10/2024 CLINICAL DATA:  Abdominal pain, diarrhea, weakness EXAM: CT ABDOMEN AND PELVIS WITHOUT CONTRAST TECHNIQUE: Multidetector CT imaging of the abdomen and pelvis was performed following the standard protocol without IV contrast. RADIATION DOSE REDUCTION: This exam was performed according to the departmental dose-optimization program which includes  automated exposure control, adjustment of the mA and/or kV according to patient size and/or use of iterative reconstruction technique. COMPARISON:  CT abdomen and pelvis dated 03/03/2021, renal ultrasound dated 03/28/2021 FINDINGS: Lower chest: Rounded left lower lobe atelectasis adjacent to the loculated small left pleural effusion. Partially imaged multichamber cardiomegaly. Partially imaged pacemaker leads terminate in the right atrium and ventricle. Hepatobiliary: No focal hepatic lesions. No intra or extrahepatic biliary ductal dilation. Normal gallbladder. Pancreas: Subcentimeter hypodensities in the pancreatic uncinate process. No main ductal dilation. Spleen: Normal in size without focal abnormality. Adrenals/Urinary Tract: 12 mm right adrenal nodule contains gross fat, unchanged from 03/03/2021, likely myelolipoma. Additional subcentimeter myelolipoma in the left adrenal gland. Again seen are bilateral renal cysts, a few of which are mildly hyperattenuating and may be hemorrhagic/proteinaceous. No hydronephrosis. Punctate nonobstructing right lower pole stone. Underdistended urinary bladder. Stomach/Bowel: Normal appearance of the stomach. No evidence of bowel wall thickening, distention, or inflammatory changes. Colonic diverticulosis without acute diverticulitis. Layering fluid density within the colon. Appendix is not discretely seen. Vascular/Lymphatic: Aortic atherosclerosis. No enlarged abdominal or pelvic lymph nodes. Reproductive:  Prostate is unremarkable. Other: No free fluid, fluid collection, or free air. Musculoskeletal: No acute or abnormal lytic or blastic osseous lesions. Partially imaged median sternotomy wires are nondisplaced. Multilevel degenerative changes of the partially imaged thoracic and lumbar spine. Small fat-containing right inguinal hernia. Mild left flank body wall edema. IMPRESSION: 1. No acute abdominopelvic findings. 2. Layering fluid density within the colon in keeping with diarrheal illness. 3. Colonic diverticulosis without acute diverticulitis. 4. Rounded left lower lobe atelectasis adjacent to the loculated small left pleural effusion. 5. Subcentimeter hypodensities in the pancreatic uncinate process, likely side branch IPMNs. Follow-up imaging in 2 years can be considered with contrast-enhanced MRI/MRCP according to the patient's goals of care. 6.  Aortic Atherosclerosis (ICD10-I70.0). Electronically Signed   By: Agustin Cree M.D.   On: 01/10/2024 13:43   DG Chest Port 1 View Result Date: 01/10/2024 CLINICAL DATA:  Sepsis EXAM: PORTABLE CHEST 1 VIEW COMPARISON:  Chest radiograph dated 12/19/2023 FINDINGS: Lines/tubes: Left chest wall pacemaker leads project over the right atrium and ventricle. Lungs: Well inflated lungs. Decreased but persistent patchy left retrocardiac opacities. Pleura: Unchanged trace blunting of the left costophrenic angle. No pneumothorax. Heart/mediastinum: Similar mildly enlarged, postsurgical cardiomediastinal silhouette. Bones: Median sternotomy wires are nondisplaced. IMPRESSION: 1. Decreased but persistent patchy left retrocardiac opacities, which may represent atelectasis or resolving aspiration or pneumonia. 2. Unchanged trace blunting of the left costophrenic angle, which may represent a trace pleural effusion or pleural thickening. Electronically Signed   By: Agustin Cree M.D.   On: 01/10/2024 10:13    Procedures .Critical Care  Performed by: Ma Rings, PA-C Authorized  by: Ma Rings, PA-C   Critical care provider statement:    Critical care time (minutes):  80   Critical care time was exclusive of:  Separately billable procedures and treating other patients   Critical care was necessary to treat or prevent imminent or life-threatening deterioration of the following conditions:  Renal failure and dehydration   Critical care was time spent personally by me on the following activities:  Development of treatment plan with patient or surrogate, discussions with consultants, evaluation of patient's response to treatment, examination of patient, ordering and review of laboratory studies, ordering and review of radiographic studies, ordering and performing treatments and interventions, pulse oximetry, re-evaluation of patient's condition, review of old charts and obtaining history from patient or surrogate   Care  discussed with comment:  Dr. Merrily Pew, Dr. Adrian Blackwater     Medications Ordered in ED Medications  lactated ringers infusion ( Intravenous New Bag/Given 01/10/24 1445)  loperamide (IMODIUM) capsule 2 mg (has no administration in time range)  lactated ringers bolus 500 mL (0 mLs Intravenous Stopped 01/10/24 1114)  sodium chloride 0.9 % bolus 500 mL (0 mLs Intravenous Stopped 01/10/24 1335)  phytonadione (VITAMIN K) tablet 5 mg (5 mg Oral Given 01/10/24 1200)  sodium chloride 0.9 % bolus 1,000 mL (0 mLs Intravenous Stopped 01/10/24 1354)  lactated ringers bolus 700 mL (0 mLs Intravenous Stopped 01/10/24 1444)  lactated ringers bolus 1,000 mL (0 mLs Intravenous Stopped 01/10/24 1639)    ED Course/ Medical Decision Making/ A&P Clinical Course as of 01/10/24 1716  Fri Jan 10, 2024  3151 89 year old male here with increased weakness in the setting of continued diarrhea since discharge.  Blood pressure soft and patient mildly hypothermic.  Getting labs and fluids.  Labs coming back with elevated BUN and creatinine and lactic acid.  Getting fluids and further infectious  workup.  Will need admission to the hospital. [MB]  1400 Came in with diarrhea and generalized weakness since leaving the hospital several weeks ago, he has a significant AKI appears to be dehydrated.  No obvious signs of infection patient not having infection symptoms at home.  Mildly hypothermic and patient had warm blankets applied to him.  Blood pressure was soft, given fluids and small boluses with frequent reassessment as he does have CHF and has not been taking his Lasix, no signs of volume overload on exam on rechecks or initially.  Blood pressure initially improved but has dropped again, we are giving the remainder of his 30 cc/kg bolus and will recheck.  Second lactate is pending.  Patient will need to be admitted.  CT showed diarrheal illness and a small loculated pleural effusion but no other significant findings.  Discussed with Dr. Charm Barges who is the ED attending who also evaluated patient.  This time feel hypotension is more likely related to his profound dehydration and will continue fluids, but will discuss with the potential hospitalist need for antibiotics [CB]  1500 MAP still low, though patient is mentating well. He confirmed he is full code. Critical care consulted at this time.   [CB]  1706 With Dr. Merrily Pew from critical care.  Discussed possible antibiotics, possible vasopressors.  He feels the patient is just profoundly dehydrated recommended additional IV fluids, consultation with hospitalist and he is happy to help with hospitalist cannot take care of patient.  Patient's blood pressure did improve with additional IV fluids, trending up with maps in the low 60s.  Discussed with Dr. Adrian Blackwater the hospitalist.  He is agreeable to keeping patient here [CB]  1715 Temp up from 94 to 95.4 with Bair Hugger.  Patient initial rectal temp was 96.4, feel temperature likely dropped in part due to volume of IV fluids at room temperature [CB]    Clinical Course User Index [CB] Carmel Sacramento A,  PA-C [MB] Terrilee Files, MD                                 Medical Decision Making This patient presents to the ED for concern of diarrhea, generalized fatigue and weakness, this involves an extensive number of treatment options, and is a complaint that carries with it a high risk of complications and morbidity.  The differential diagnosis  includes c diff, colitis, electrolyte derangement, AKI, dehydration, uti, sepsis, influenza, covid 19, other   Co morbidities that complicate the patient evaluation :   CHF, A fib on coumadin, AVR, recent bacteremia,   Additional history obtained:  Additional history obtained from EMR External records from outside source obtained and reviewed including prior notes and labs   Lab Tests:  I Ordered, and personally interpreted labs.  The pertinent results include: C. difficile negative, initial lactate 2.8, trended down to 1.6; UA shows states him red blood cells, 2150 white blood cells and rare bacteria with no nitrate, no leukocytes; INR sniffily elevated at 9.8; CMP shows hyponatremia with sodium of 128, CO2 decreased at 12 likely due to diarrhea; BUN and creatinine significantly elevated at 59 and 4.33 respectively   Imaging Studies ordered:  I ordered imaging studies including Chest Xray which shows decreased patchy left opacity; pelvis shows a small loculated pleural effusion, diarrheal illness no acute findings I independently visualized and interpreted imaging within scope of identifying emergent findings  I agree with the radiologist interpretation   Cardiac Monitoring: / EKG:  The patient was maintained on a cardiac monitor.  I personally viewed and interpreted the cardiac monitored which showed an underlying rhythm of: ventricular paced rhythm   Consultations Obtained:  I requested consultation with the intensivist Dr. Laveda Norman and,  and discussed lab and imaging findings as well as pertinent plan - they recommend: Additional IV  fluids, consultation with hospitalist  Discussed with hospitalist Dr. Adrian Blackwater who is agreeable with admission for patient's significant AKI due to likely due to profound dehydration Problem List / ED Course / Critical interventions / Medication management   With history of A-fib, CHF, AVR, recent pneumonia and sepsis.  Presents to ER for diarrhea and generalized weakness worsening since his discharge to hospital about 2 weeks ago.  He has had decreased p.o. intake as well, has lost 24 pounds per his family is down to around 180 pounds since he was discharged from the hospital. Patient is on Coumadin.  INR simply elevated at 9.4, given p.o. vitamin K he is not bleeding.  Patient has not had his Coumadin in 4 days. C. difficile negative, he has a significant AKI, has some white blood cells in his urine but no nitrate or leukocyte and only rare bacteria, he is not having dysuria or frequency.  Discussed with ED attending, no recommendation on antibiotics at this time, will admit to hospitalist for patient's dehydration.  Patient did have low blood pressure, gradually improving with IV hydration.  Discussed with ICU regarding possible vasopressors but they felt he needed to continue IV fluids and recommended holding off on pressors at this time.  Did speak with patient and his family, patient is alert and oriented x 4 and demonstrates decision-making capacity.  He does want any and all intervention at this time including CPR and intubation.  I ordered medication including IV fluids for dehydration Reevaluation of the patient after these medicines showed that the patient improved I have reviewed the patients home medicines and have made adjustments as needed   Social Determinants of Health:  Patient lives independently      Amount and/or Complexity of Data Reviewed Labs: ordered. Radiology: ordered.  Risk Prescription drug management. Decision regarding  hospitalization.           Final Clinical Impression(s) / ED Diagnoses Final diagnoses:  AKI (acute kidney injury) (HCC)  SIRS (systemic inflammatory response syndrome) (HCC)    Rx / DC  Orders ED Discharge Orders     None         Josem Kaufmann 01/10/24 1716    Terrilee Files, MD 01/11/24 [phone]

## 2024-01-10 NOTE — ED Notes (Signed)
 Verbal bolus .

## 2024-01-10 NOTE — H&P (Signed)
 History and Physical    Patient: Douglas [name] NUU:[id number] DOB: 08/03/1928 DOA: 01/10/2024 DOS: the patient was seen and examined on 01/10/2024 PCP: Carylon Perches, MD  Patient coming from: Home  Chief Complaint:  Chief Complaint  Patient presents with   Diarrhea   Weakness   HPI: Douglas [name] is a 89 y.o. male with medical history significant of Afib on warfarin, s/p AVR, CAD, HTN, h/o AVM, h/o esophageal ulcer, h/o CHB with pacemaker in place, GERD.  He was recently admitted to the hospital due to bacteremia and sepsis due to pneumonia.  He was treated with antibiotics and discharged to home on 3/4.  Since that day, he has had several episodes of watery nonbloody diarrhea.  This has been going on for about 3 weeks.  He did go see his primary care who prescribed him antidiarrheal agents, but this did not help.  He has been feeling cold, fatigued and was finally brought to the hospital by his family due to him not improving.    Here, he was found to be hypothermic.  His sodium level was 128, his creatinine was 4.33 (baseline is approximately 1.1), his initial lactic acid was 2.8 with a repeat of 1.6 after fluid resuscitation.  Additionally, his INR is 9.8.  His blood pressure has been fairly soft dropping into the 80s to 90s systolically.  He was fluid resuscitated with 30 mL/kg with improvement, but still a little soft.  He had a CT of his abdomen, which showed air-fluid levels in the colon, but no colitis or diverticulitis.   Review of Systems: As mentioned in the history of present illness. All other systems reviewed and are negative. Past Medical History:  Diagnosis Date   Acute upper GI bleed 05/18/2016   Aneurysm of thoracic aorta (HCC)    Ascending. 5.7 x 5.5cm - Bentall procedure   Aortic stenosis    Bioprosthetic AVR   Arthritis    Atrial fibrillation (HCC)    Documented on pacer interrogation 6/13   Complete heart block (HCC)    Status post pacemaker placement   Coronary  atherosclerosis of native coronary artery    Multivessel - LIMA to LAD, SVG to OM, SVG to PDA   DJD (degenerative joint disease)    Esophageal dysmotility    Essential hypertension    Fibromyalgia    Gout    H/O arteriovenous malformation (AVM)    H/O hiatal hernia    Hemorrhoid    History of esophageal ulcer    History of skin cancer    Hx of adenomatous colonic polyps    Iron deficiency anemia due to chronic blood loss    Melena    Pacemaker-Medtronic    PMR (polymyalgia rheumatica) (HCC)    SIRS (systemic inflammatory response syndrome) (HCC) 01/16/2015   Past Surgical History:  Procedure Laterality Date   AORTIC VALVE REPLACEMENT  01/24/12   APPENDECTOMY     BACK SURGERY     1965 or 1966 lumbar back surgery   BENTALL PROCEDURE  01/24/2012   Procedure: BENTALL PROCEDURE;  Surgeon: Kerin Perna, MD;  [facility];  Service: Open Heart Surgery;  Laterality: N/A;   BILATERAL KNEE ARTHROSCOPY     CATARACT EXTRACTION W/PHACO Left 12/03/2016   Procedure: CATARACT EXTRACTION PHACO AND INTRAOCULAR LENS PLACEMENT LEFT EYE;  Surgeon: Gemma Payor, MD;  Location: [facility] ORS;  Service: Ophthalmology;  Laterality: Left;  CDE:  10.87   CATARACT EXTRACTION W/PHACO Right 12/13/2016   Procedure: CATARACT  EXTRACTION PHACO AND INTRAOCULAR LENS PLACEMENT (IOC);  Surgeon: Gemma Payor, MD;  Location: [facility] ORS;  Service: Ophthalmology;  Laterality: Right;  right cde 11.78   CORONARY ARTERY BYPASS GRAFT  01/24/2012   Procedure: CORONARY ARTERY BYPASS GRAFTING (CABG);  Surgeon: Kerin Perna, MD;  Location: [facility] OR;  Service: Open Heart Surgery;  Laterality: N/A;   ESOPHAGOGASTRODUODENOSCOPY  06/17/2012   Procedure: ESOPHAGOGASTRODUODENOSCOPY (EGD);  Surgeon: Malissa Hippo, MD;  Location: [facility] ENDO SUITE;  Service: Endoscopy;  Laterality: N/A;  ED   ESOPHAGOGASTRODUODENOSCOPY N/A 05/19/2016   Procedure: ESOPHAGOGASTRODUODENOSCOPY (EGD);  Surgeon: West Bali, MD;  Location: [facility] END[facility] SUITE;  Service: Endoscopy;   Laterality: N/A;   HEMORROIDECTOMY     HERNIA REPAIR     Left inguinal hernia repair     PACEMAKER PLACEMENT     Medtronic 4/13 - Dr. Johney Frame   PERMANENT PACEMAKER INSERTION N/A 02/01/2012   Procedure: PERMANENT PACEMAKER INSERTION;  Surgeon: Hillis Range, MD; Medtronic Adapta L model ADDRL 1 (serial number [id number])     TRANSESOPHAGEAL ECHOCARDIOGRAM (CATH LAB) N/A 12/23/2023   Procedure: TRANSESOPHAGEAL ECHOCARDIOGRAM;  Surgeon: Jodelle Red, MD;  Location: [facility] INVASIVE CV LAB;  Service: Cardiovascular;  Laterality: N/A;   Social History:  reports that he has never smoked. He has never used smokeless tobacco. He reports that he does not drink alcohol and does not use drugs.  Allergies  Allergen Reactions   Imdur [Isosorbide Nitrate] Other (See Comments)    Nausea and lightheaded     Family History  Problem Relation Age of Onset   Kidney disease Mother    Heart disease Father    Anesthesia problems Neg Hx    Hypotension Neg Hx    Malignant hyperthermia Neg Hx    Pseudochol deficiency Neg Hx     Prior to Admission medications   Medication Sig Start Date End Date Taking? Authorizing Provider  acetaminophen (TYLENOL) 500 MG tablet Take 1,000 mg by mouth every 6 (six) hours as needed for mild pain.    [provider]  allopurinol (ZYLOPRIM) 300 MG tablet Take 300 mg by mouth daily. Take 1  daily    [provider]  diclofenac Sodium (VOLTAREN) 1 % GEL Apply 2 g topically 4 (four) times daily. Apply to knees Patient not taking: Reported on 01/08/2024 12/24/23   Rai, Delene Ruffini, MD  diphenoxylate-atropine (LOMOTIL) 2.5-0.025 MG tablet Take 1 tablet by mouth 4 (four) times daily as needed for diarrhea or loose stools. 01/06/24   [provider]  furosemide (LASIX) 20 MG tablet Take 20 mg by mouth every other day. Patient not taking: Reported on 01/08/2024 11/25/23   [provider]  linaclotide Karlene Einstein) 145 MCG CAPS capsule Take 1 capsule (145 mcg  total) by mouth as needed (constipation). Patient not taking: Reported on 01/08/2024 12/26/23   Marguerita Merles, Reuel Boom, MD  metoprolol succinate (TOPROL-XL) 25 MG 24 hr tablet TAKE (2) TABLETS BY MOUTH DAILY. 10/17/23   Jonelle Sidle, MD  pantoprazole (PROTONIX) 40 MG tablet TAKE ONE TABLET BY MOUTH ONCE DAILY IN THE MORNING. 11/24/15   Jonelle Sidle, MD  pravastatin (PRAVACHOL) 20 MG tablet Take 20 mg by mouth every evening.  10/27/15   [provider]  sennosides-docusate sodium (SENOKOT-S) 8.6-50 MG tablet Take 1 tablet by mouth 2 (two) times daily. Patient not taking: Reported on 01/08/2024 12/30/17   Malissa Hippo, MD  warfarin (COUMADIN) 5 MG tablet TAKE 1 TABLET BY MOUTH ONCE DAILY OR  AS DIRECTED. Patient not taking: Reported on 01/08/2024 10/01/23   Jonelle Sidle, MD    Physical Exam: Vitals:   01/10/24 1655 01/10/24 1700 01/10/24 1715 01/10/24 1730  BP:  (!) 94/33 (!) 89/41 (!) 95/37  Pulse:  72 70 69  Resp:  19 (!) 25 16  Temp: (!) 95.9 F (35.5 C)     TempSrc: Rectal     SpO2:  100% 100% 100%  Weight:      Height:       General: Elderly male. Awake and alert and oriented x3. No acute cardiopulmonary distress.  HEENT: Normocephalic atraumatic.  Right and left ears normal in appearance.  Pupils equal, round, reactive to light. Extraocular muscles are intact. Sclerae anicteric and noninjected.  Moist mucosal membranes. No mucosal lesions.  Neck: Neck supple without lymphadenopathy. No carotid bruits. No masses palpated.  Cardiovascular: Regular rate with normal S1-S2 sounds. No murmurs, rubs, gallops auscultated. No JVD.  Respiratory: Good respiratory effort with no wheezes, rales, rhonchi. Lungs clear to auscultation bilaterally.  No accessory muscle use. Abdomen: Soft, nontender, nondistended. Active bowel sounds. No masses or hepatosplenomegaly  Skin: No rashes, lesions, or ulcerations.  Dry, warm to touch. 2+ dorsalis pedis and radial  pulses. Musculoskeletal: No calf or leg pain. All major joints not erythematous nontender.  No upper or lower joint deformation.  Good ROM.  No contractures  Psychiatric: Intact judgment and insight. Pleasant and cooperative. Neurologic: No focal neurological deficits. Strength is 5/5 and symmetric in upper and lower extremities.  Cranial nerves II through XII are grossly intact.  Data Reviewed: Results for orders placed or performed during the hospital encounter of 01/10/24 (from the past 24 hours)  Comprehensive metabolic panel     Status: Abnormal   Collection Time: 01/10/24  9:36 AM  Result Value Ref Range   Sodium 128 (L) 135 - 145 mmol/L   Potassium 3.6 3.5 - 5.1 mmol/L   Chloride 103 98 - 111 mmol/L   CO2 12 (L) 22 - 32 mmol/L   Glucose, Bld 134 (H) 70 - 99 mg/dL   BUN 59 (H) 8 - 23 mg/dL   Creatinine, Ser 1.61 (H) 0.61 - 1.24 mg/dL   Calcium 8.2 (L) 8.9 - 10.3 mg/dL   Total Protein 5.9 (L) 6.5 - 8.1 g/dL   Albumin 2.9 (L) 3.5 - 5.0 g/dL   AST 14 (L) 15 - 41 U/L   ALT 10 0 - 44 U/L   Alkaline Phosphatase 100 38 - 126 U/L   Total Bilirubin 0.6 0.0 - 1.2 mg/dL   GFR, Estimated 12 (L) >60 mL/min   Anion gap 13 5 - 15  Lactic acid, plasma     Status: Abnormal   Collection Time: 01/10/24  9:36 AM  Result Value Ref Range   Lactic Acid, Venous 2.8 (HH) 0.5 - 1.9 mmol/L  CBC with Differential     Status: Abnormal   Collection Time: 01/10/24  9:36 AM  Result Value Ref Range   WBC 9.9 4.0 - 10.5 K/uL   RBC 4.52 4.22 - 5.81 MIL/uL   Hemoglobin 13.2 13.0 - 17.0 g/dL   HCT 09.6 04.5 - 40.9 %   MCV 92.3 80.0 - 100.0 fL   MCH 29.2 26.0 - 34.0 pg   MCHC 31.7 30.0 - 36.0 g/dL   RDW 81.1 91.4 - 78.2 %   Platelets 117 (L) 150 - 400 K/uL   nRBC 0.0 0.0 - 0.2 %   Neutrophils Relative % 56 %  Neutro Abs 5.6 1.7 - 7.7 K/uL   Lymphocytes Relative 19 %   Lymphs Abs 1.9 0.7 - 4.0 K/uL   Monocytes Relative 9 %   Monocytes Absolute 0.9 0.1 - 1.0 K/uL   Eosinophils Relative 16 %    Eosinophils Absolute 1.6 (H) 0.0 - 0.5 K/uL   Basophils Relative 0 %   Basophils Absolute 0.0 0.0 - 0.1 K/uL   Immature Granulocytes 0 %   Abs Immature Granulocytes 0.03 0.00 - 0.07 K/uL  Protime-INR     Status: Abnormal   Collection Time: 01/10/24  9:36 AM  Result Value Ref Range   Prothrombin Time 78.7 (H) 11.4 - 15.2 seconds   INR 9.8 (HH) 0.8 - 1.2  Urinalysis, w/ Reflex to Culture (Infection Suspected) -Urine, Clean Catch     Status: Abnormal   Collection Time: 01/10/24  9:36 AM  Result Value Ref Range   Specimen Source URINE, CATHETERIZED    Color, Urine AMBER (A) YELLOW   APPearance CLOUDY (A) CLEAR   Specific Gravity, Urine 1.018 1.005 - 1.030   pH 5.0 5.0 - 8.0   Glucose, UA 50 (A) NEGATIVE mg/dL   Hgb urine dipstick MODERATE (A) NEGATIVE   Bilirubin Urine NEGATIVE NEGATIVE   Ketones, ur NEGATIVE NEGATIVE mg/dL   Protein, ur 161 (A) NEGATIVE mg/dL   Nitrite NEGATIVE NEGATIVE   Leukocytes,Ua NEGATIVE NEGATIVE   RBC / HPF 6-10 0 - 5 RBC/hpf   WBC, UA 21-50 0 - 5 WBC/hpf   Bacteria, UA RARE (A) NONE SEEN   Squamous Epithelial / HPF 0-5 0 - 5 /HPF   WBC Clumps PRESENT    Mucus PRESENT    Hyaline Casts, UA PRESENT   C Difficile Quick Screen w PCR reflex     Status: None   Collection Time: 01/10/24 10:00 AM   Specimen: Stool  Result Value Ref Range   C Diff antigen NEGATIVE NEGATIVE   C Diff toxin NEGATIVE NEGATIVE   C Diff interpretation No C. difficile detected.   Lactic acid, plasma     Status: None   Collection Time: 01/10/24  1:53 PM  Result Value Ref Range   Lactic Acid, Venous 1.6 0.5 - 1.9 mmol/L    CT ABDOMEN PELVIS WO CONTRAST Result Date: 01/10/2024 CLINICAL DATA:  Abdominal pain, diarrhea, weakness EXAM: CT ABDOMEN AND PELVIS WITHOUT CONTRAST TECHNIQUE: Multidetector CT imaging of the abdomen and pelvis was performed following the standard protocol without IV contrast. RADIATION DOSE REDUCTION: This exam was performed according to the departmental  dose-optimization program which includes automated exposure control, adjustment of the mA and/or kV according to patient size and/or use of iterative reconstruction technique. COMPARISON:  CT abdomen and pelvis dated 03/03/2021, renal ultrasound dated 03/28/2021 FINDINGS: Lower chest: Rounded left lower lobe atelectasis adjacent to the loculated small left pleural effusion. Partially imaged multichamber cardiomegaly. Partially imaged pacemaker leads terminate in the right atrium and ventricle. Hepatobiliary: No focal hepatic lesions. No intra or extrahepatic biliary ductal dilation. Normal gallbladder. Pancreas: Subcentimeter hypodensities in the pancreatic uncinate process. No main ductal dilation. Spleen: Normal in size without focal abnormality. Adrenals/Urinary Tract: 12 mm right adrenal nodule contains gross fat, unchanged from 03/03/2021, likely myelolipoma. Additional subcentimeter myelolipoma in the left adrenal gland. Again seen are bilateral renal cysts, a few of which are mildly hyperattenuating and may be hemorrhagic/proteinaceous. No hydronephrosis. Punctate nonobstructing right lower pole stone. Underdistended urinary bladder. Stomach/Bowel: Normal appearance of the stomach. No evidence of bowel wall thickening, distention, or inflammatory  changes. Colonic diverticulosis without acute diverticulitis. Layering fluid density within the colon. Appendix is not discretely seen. Vascular/Lymphatic: Aortic atherosclerosis. No enlarged abdominal or pelvic lymph nodes. Reproductive: Prostate is unremarkable. Other: No free fluid, fluid collection, or free air. Musculoskeletal: No acute or abnormal lytic or blastic osseous lesions. Partially imaged median sternotomy wires are nondisplaced. Multilevel degenerative changes of the partially imaged thoracic and lumbar spine. Small fat-containing right inguinal hernia. Mild left flank body wall edema. IMPRESSION: 1. No acute abdominopelvic findings. 2. Layering fluid  density within the colon in keeping with diarrheal illness. 3. Colonic diverticulosis without acute diverticulitis. 4. Rounded left lower lobe atelectasis adjacent to the loculated small left pleural effusion. 5. Subcentimeter hypodensities in the pancreatic uncinate process, likely side branch IPMNs. Follow-up imaging in 2 years can be considered with contrast-enhanced MRI/MRCP according to the patient's goals of care. 6.  Aortic Atherosclerosis (ICD10-I70.0). Electronically Signed   By: Agustin Cree M.D.   On: 01/10/2024 13:43   DG Chest Port 1 View Result Date: 01/10/2024 CLINICAL DATA:  Sepsis EXAM: PORTABLE CHEST 1 VIEW COMPARISON:  Chest radiograph dated 12/19/2023 FINDINGS: Lines/tubes: Left chest wall pacemaker leads project over the right atrium and ventricle. Lungs: Well inflated lungs. Decreased but persistent patchy left retrocardiac opacities. Pleura: Unchanged trace blunting of the left costophrenic angle. No pneumothorax. Heart/mediastinum: Similar mildly enlarged, postsurgical cardiomediastinal silhouette. Bones: Median sternotomy wires are nondisplaced. IMPRESSION: 1. Decreased but persistent patchy left retrocardiac opacities, which may represent atelectasis or resolving aspiration or pneumonia. 2. Unchanged trace blunting of the left costophrenic angle, which may represent a trace pleural effusion or pleural thickening. Electronically Signed   By: Agustin Cree M.D.   On: 01/10/2024 10:13     Assessment and Plan: No notes have been filed under this hospital service. Service: Hospitalist  Principal Problem:   Sepsis (HCC) Active Problems:   Essential hypertension, benign   Coronary atherosclerosis of native coronary artery   Atrial fibrillation (HCC)   AKI (acute kidney injury) (HCC)   Lactic acidosis   Acute hyponatremia   Dehydration   Diarrhea   Supratherapeutic INR  Severe sepsis Cultures pending, both blood and urine Appears that source is GI, although no radiological signs  of colitis on CT.  C. difficile is negative Stool culture sent Will start the patient on broad-spectrum antibiotics due to severity of illness: Hypothermic, tenuous blood pressures, etc. Dehydration due to diarrhea IV fluid replacement Start antidiarrheal agents Fiber supplements AKI Fluid replacements Check creatinine in the morning Lactic acidosis Creatinine improved Acute hyponatremia Check sodium every 4 hours Supratherapeutic INR Hold Coumadin Vitamin K given orally -I am not sure whether this will be fully absorbed due to current diarrheal state Recheck INR in the morning Atrial fibrillation Rate controlled Hypertension Hold blood pressure medication   Advance Care Planning:   Code Status: Full Code confirmed by patient's  Consults:   Family Communication: Patient's daughter and daughter-in-law present during interview and exam  Severity of Illness: The appropriate patient status for this patient is INPATIENT. Inpatient status is judged to be reasonable and necessary in order to provide the required intensity of service to ensure the patient's safety. The patient's presenting symptoms, physical exam findings, and initial radiographic and laboratory data in the context of their chronic comorbidities is felt to place them at high risk for further clinical deterioration. Furthermore, it is not anticipated that the patient will be medically stable for discharge from the hospital within 2 midnights of admission.   *  I certify that at the point of admission it is my clinical judgment that the patient will require inpatient hospital care spanning beyond 2 midnights from the point of admission due to high intensity of service, high risk for further deterioration and high frequency of surveillance required.*  Author: Levie Heritage, DO 01/10/2024 6:03 PM  For on call review [email or web address].

## 2024-01-10 NOTE — ED Notes (Signed)
 Manual bp LAC 80/38, RAC 90/40. PA aware.

## 2024-01-10 NOTE — Patient Outreach (Addendum)
 Care Management  Transitions of Care Program Transitions of Care Post-discharge  Inbound Call From Family    01/10/2024 Name: Douglas [name] MRN: [id number] DOB: 1928-02-26  Subjective: Douglas [name] is a 89 y.o. year old male who is a primary care patient of Carylon Perches, MD. The Care Management team Engaged with patient Engaged with patient by telephone to assess and address transitions of care needs.   Consent to Services:  Patient was given information about care management services, agreed to services, and gave verbal consent to participate.   Assessment:     Inbound Call was received from patient and his daughter in law, Elease Hashimoto. Elease Hashimoto advising this RN that patient diarrhea has worsened and reports he has a "sour stomach" and is not feeling well and feels he needs to go to get checked. TOC RN spoke with patient who now states he is feeling worse. TOC RN asked patient if he wanted to see if he could be seen today by PCP and patient feels he needs to go to either urgent care or ED and put his daughter in law back on the phone. TOC RN advised daughter in law to call urgent care and see if they could administer IV fluids if that's needed and if not, probably best to take him back to the hospital. Daughter in law agreed to call [facility] RN back with outcome. Message was sent to PCP.      SDOH Interventions    Flowsheet Row Telephone from 12/25/2023 in [place] POPULATION HEALTH DEPARTMENT  SDOH Interventions   Food Insecurity Interventions Intervention Not Indicated  Housing Interventions Intervention Not Indicated  Transportation Interventions Intervention Not Indicated, Patient Resources (Friends/Family)  Utilities Interventions Intervention Not Indicated        Goals Addressed   None     Plan: Telephone follow up appointment with care management team: [facility] RN will monitor to see if patient is readmitted and will follow up accordingly.  The patient has been provided with contact  information for the care management team and has been advised to call with any health related questions or concerns.   Hilbert Odor RN, CCM [place]  VBCI-Population Health RN Care Manager [phone]

## 2024-01-10 NOTE — ED Notes (Signed)
 Patient transported to CT

## 2024-01-10 NOTE — Progress Notes (Signed)
 Continues to be mildly hypotensive with low MAP. Will start phenylephrine.

## 2024-01-10 NOTE — ED Notes (Addendum)
 Pt family has taken all of pt belongings.  Pt prefers his meds whole w/applesauce.

## 2024-01-11 DIAGNOSIS — I480 Paroxysmal atrial fibrillation: Secondary | ICD-10-CM | POA: Diagnosis not present

## 2024-01-11 DIAGNOSIS — N179 Acute kidney failure, unspecified: Secondary | ICD-10-CM

## 2024-01-11 DIAGNOSIS — R197 Diarrhea, unspecified: Secondary | ICD-10-CM

## 2024-01-11 DIAGNOSIS — E871 Hypo-osmolality and hyponatremia: Secondary | ICD-10-CM

## 2024-01-11 LAB — GASTROINTESTINAL PANEL BY PCR, STOOL (REPLACES STOOL CULTURE)

## 2024-01-11 LAB — BASIC METABOLIC PANEL
Anion gap: 14 (ref 5–15)
Anion gap: 12 (ref 5–15)
BUN: 56 mg/dL — ABNORMAL HIGH (ref 8–23)
BUN: 58 mg/dL — ABNORMAL HIGH (ref 8–23)
CO2: 7 mmol/L — ABNORMAL LOW (ref 22–32)
CO2: 9 mmol/L — ABNORMAL LOW (ref 22–32)
Calcium: 7.3 mg/dL — ABNORMAL LOW (ref 8.9–10.3)
Calcium: 7.9 mg/dL — ABNORMAL LOW (ref 8.9–10.3)
Chloride: 108 mmol/L (ref 98–111)
Chloride: 111 mmol/L (ref 98–111)
Creatinine, Ser: 4.1 mg/dL — ABNORMAL HIGH (ref 0.61–1.24)
Creatinine, Ser: 3.98 mg/dL — ABNORMAL HIGH (ref 0.61–1.24)
GFR, Estimated: 13 mL/min — ABNORMAL LOW (ref >60)
GFR, Estimated: 13 mL/min — ABNORMAL LOW (ref >60)
Glucose, Bld: 135 mg/dL — ABNORMAL HIGH (ref 70–99)
Glucose, Bld: 119 mg/dL — ABNORMAL HIGH (ref 70–99)
Potassium: 3.5 mmol/L (ref 3.5–5.1)
Potassium: 4 mmol/L (ref 3.5–5.1)
Sodium: 129 mmol/L — ABNORMAL LOW (ref 135–145)
Sodium: 132 mmol/L — ABNORMAL LOW (ref 135–145)

## 2024-01-11 LAB — CBC
HCT: 35.5 % — ABNORMAL LOW (ref 39.0–52.0)
Hemoglobin: 11.6 g/dL — ABNORMAL LOW (ref 13.0–17.0)
MCH: 29.9 pg (ref 26.0–34.0)
MCHC: 32.7 g/dL (ref 30.0–36.0)
MCV: 91.5 fL (ref 80.0–100.0)
Platelets: 100 10*3/uL — ABNORMAL LOW (ref 150–400)
RBC: 3.88 MIL/uL — ABNORMAL LOW (ref 4.22–5.81)
RDW: 15.3 % (ref 11.5–15.5)
WBC: 8.2 10*3/uL (ref 4.0–10.5)
nRBC: 0 % (ref 0.0–0.2)

## 2024-01-11 LAB — URINE CULTURE: Culture: NO GROWTH

## 2024-01-11 LAB — PROTIME-INR
INR: 8.2 (ref 0.8–1.2)
Prothrombin Time: 68.2 s — ABNORMAL HIGH (ref 11.4–15.2)

## 2024-01-11 MED ORDER — SODIUM BICARBONATE 650 MG PO TABS
650.0000 mg | ORAL_TABLET | Freq: Three times a day (TID) | ORAL | Status: DC
  Administered 2024-01-11 – 2024-01-12 (×2): 650 mg via ORAL
  Filled 2024-01-11 (×2): qty 1

## 2024-01-11 MED ORDER — PHYTONADIONE 5 MG PO TABS
5.0000 mg | ORAL_TABLET | Freq: Once | ORAL | Status: AC
  Administered 2024-01-11: 5 mg via ORAL
  Filled 2024-01-11: qty 1

## 2024-01-11 MED ORDER — LOPERAMIDE HCL 2 MG PO CAPS
2.0000 mg | ORAL_CAPSULE | Freq: Four times a day (QID) | ORAL | Status: DC | PRN
  Administered 2024-01-12 (×2): 2 mg via ORAL
  Filled 2024-01-11 (×2): qty 1

## 2024-01-11 MED ORDER — ORAL CARE MOUTH RINSE: 15.0000 mL | OROMUCOSAL | Status: DC | PRN

## 2024-01-11 NOTE — Progress Notes (Signed)
 Critical results Documentation    01/11/24 0515  Provider Notification  Provider Name/Title Dr. Thomes Dinning  Date Provider Notified 01/11/24  Time Provider Notified [phone]  Method of Notification Page  Notification Reason Critical Result  Test performed and critical result INR 8.2  Date Critical Result Received 01/11/24  Time Critical Result Received 0504  Provider response Other (Comment) (awaiting)

## 2024-01-11 NOTE — Progress Notes (Signed)
 Patient has been having type 7 loose stools all night, asked the MD for the flexiseal, tried inserting and fixing the flexiseal in  place , patient contracting and restricting himself too much, could not get in in, educated the patient, hold for now, will endorse to the oncoming RN.

## 2024-01-11 NOTE — Plan of Care (Signed)

## 2024-01-11 NOTE — Progress Notes (Signed)
 PROGRESS NOTE   Douglas [name], is a 89 y.o. male, DOB - 11-11-1927, NWG:[id number]  Admit date - 01/10/2024   Admitting Physician Levie Heritage, DO  Outpatient Primary MD for the patient is Carylon Perches, MD  LOS - 1  Chief Complaint  Patient presents with   Diarrhea   Weakness        Brief Narrative:   89 y.o. male with medical history significant of Afib on warfarin, s/p AVR, CAD, HTN, h/o AVM, h/o esophageal ulcer, h/o CHB with pacemaker in place, GERD admitted on 01/10/2023 with severe sepsis with septic shock requiring Neo-Synephrine for pressure support    -Assessment and Plan: 1)Severe-Sepsis with septic shock and hemodynamic instability requiring Neo-Synephrine for pressor support---suspect aspiration pneumonia related -- Wean off Neo-Synephrine as of 01/11/2024 -BP improving with IV fluids -History of Enterococcus faecalis bacteremia from 12/19/2023 -Blood and urine cultures from 01/10/2024 NGTD -Continue IV Rocephin for now  --C. difficile negative--stool for C. difficile negative --okay to use Imodium prn -Urine culture negative -Okay to stop Flagyl -CT abdomen and pelvis without colitis or diverticulitis, no acute abdominopelvic findings on CT -- Chest x-ray on admission suggest possible aspiration pneumonia  2)Hyponatremia--- due to GI losses/dehydration -Sodium on admission was 128 -Trending up with hydration  3)AKI----acute kidney injury with metabolic acidosis in the setting of GI losses creatinine on admission=4.33 (peak on 01/10/24) -- baseline creatinine =  1.04 (on 12/24/2023) -- creatinine is now= improving with hydration and improvement in BP,  -Give bicarb replacements --renally adjust medications, avoid nephrotoxic agents / dehydration  / hypotension  4) chronic anemia--- hemoglobin currently above 11 which is close to prior baseline -No obvious bleeding concerns -Continue to monitor  5) chronic thrombocytopenia--platelets slightly lower than  baseline -No bleeding concerns, continue to monitor  6)PAFib--supratherapeutic INR noted -Give additional vitamin K -Flagyl may further increase INR---Flagyl discontinued on 01/11/2024 -Continue to hold Coumadin  CRITICAL CARE Performed by: Shon Hale   Total critical care time: 47 minutes  Critical care time was exclusive of separately billable procedures and treating other patients.  =-Severe sepsis with persistent hypotension and metabolic acidosis and hemodynamic instability requiring IV fluids and IV Neo-Synephrine for pressure support --Weaning off Neo-Synephrine   Critical care was necessary to treat or prevent imminent or life-threatening deterioration.  Critical care was time spent personally by me on the following activities: development of treatment plan with patient and/or surrogate as well as nursing, discussions with consultants, evaluation of patient's response to treatment, examination of patient, obtaining history from patient or surrogate, ordering and performing treatments and interventions, ordering and review of laboratory studies, ordering and review of radiographic studies, pulse oximetry and re-evaluation of patient's condition.   Status is: Inpatient   Disposition: The patient is from: Home              Anticipated d/c is to: Home              Anticipated d/c date is: > 3 days              Patient currently is not medically stable to d/c. Barriers: Not Clinically Stable-   Code Status :  -  Code Status: Full Code   Family Communication:   None at bedside  DVT Prophylaxis  :   - SCDs  SCDs Start: 01/10/24 1851   Lab Results  Component Value Date   PLT 100 (L) 01/11/2024   Inpatient Medications  Scheduled Meds:  Chlorhexidine Gluconate Cloth  6  each Topical Q0600   feeding supplement  237 mL Oral BID BM   pantoprazole (PROTONIX) IV  40 mg Intravenous Q12H   polycarbophil  625 mg Oral Daily   pravastatin  20 mg Oral QPM   Continuous  Infusions:  cefTRIAXone (ROCEPHIN)  IV 200 mL/hr at 01/11/24 1826   metronidazole Stopped (01/11/24 1803)   phenylephrine (NEO-SYNEPHRINE) Adult infusion Stopped (01/11/24 1715)   PRN Meds:.loperamide, ondansetron **OR** ondansetron (ZOFRAN) IV   Anti-infectives (From admission, onward)    Start     Dose/Rate Route Frequency Ordered Stop   01/10/24 1815  cefTRIAXone (ROCEPHIN) 2 g in sodium chloride 0.9 % 100 mL IVPB        2 g 200 mL/hr over 30 Minutes Intravenous Every 24 hours 01/10/24 1802     01/10/24 1815  metroNIDAZOLE (FLAGYL) IVPB 500 mg        500 mg 100 mL/hr over 60 Minutes Intravenous Every 12 hours 01/10/24 1802         Subjective: Douglas [name] today has no fevers, no emesis,  No chest pain,   -Trying to wean off Neo-Synephrine as BP improving -Continues to have diarrhea  Objective: Vitals:   01/11/24 1700 01/11/24 1715 01/11/24 1730 01/11/24 1745  BP: 128/81 (!) 124/38 (!) 114/31 (!) 112/37  Pulse: 73 70 75 70  Resp: (!) 32 20 (!) 31 (!) 22  Temp:      TempSrc:      SpO2: 100% 100% 100% 100%  Weight:      Height:        Intake/Output Summary (Last 24 hours) at 01/11/2024 1903 Last data filed at 01/11/2024 1826 Gross per 24 hour  Intake 4572.13 ml  Output 150 ml  Net 4422.13 ml   Filed Weights   01/10/24 0926 01/10/24 1854  Weight: 97 kg 84.6 kg    Physical Exam Gen:- Awake Alert,  in no apparent distress  HEENT:- [place].AT, No sclera icterus Neck-Supple Neck,No JVD,.  Lungs-  CTAB , fair symmetrical air movement CV- S1, S2 normal, regular , 2/6 SM Abd-  +ve B.Sounds, Abd Soft, No tenderness,    Extremity/Skin:- No  edema, pedal pulses present  Psych-affect is appropriate, oriented x3 Neuro-no new focal deficits, no tremors  Data Reviewed: I have personally reviewed following labs and imaging studies  CBC: Recent Labs  Lab 01/10/24 0936 01/11/24 0200  WBC 9.9 8.2  NEUTROABS 5.6  --   HGB 13.2 11.6*  HCT 41.7 35.5*  MCV 92.3 91.5  PLT  117* 100*   Basic Metabolic Panel: Recent Labs  Lab 01/10/24 0936 01/10/24 1747 01/10/24 2111 01/11/24 0200 01/11/24 0532  NA 128* 130* 129* 129* 132*  K 3.6 4.1 3.7 3.5 4.0  CL 103 107 108 108 111  CO2 12* 10* 9* 7* 9*  GLUCOSE 134* 126* 122* 135* 119*  BUN 59* 55* 56* 56* 58*  CREATININE 4.33* 4.03* 3.96* 4.10* 3.98*  CALCIUM 8.2* 7.6* 7.5* 7.3* 7.9*   GFR: Estimated Creatinine Clearance: 11.3 mL/min (A) (by C-G formula based on SCr of 3.98 mg/dL (H)). Liver Function Tests: Recent Labs  Lab 01/10/24 0936  AST 14*  ALT 10  ALKPHOS 100  BILITOT 0.6  PROT 5.9*  ALBUMIN 2.9*   Recent Results (from the past 240 hours)  Culture, blood (Routine x 2)     Status: None (Preliminary result)   Collection Time: 01/10/24  9:36 AM   Specimen: BLOOD  Result Value Ref Range Status   Specimen  Description BLOOD LEFT ASSIST CONTROL  Final   Special Requests   Final    BOTTLES DRAWN AEROBIC AND ANAEROBIC Blood Culture results may not be optimal due to an inadequate volume of blood received in culture bottles   Culture   Final    NO GROWTH < 24 HOURS Performed at [facility], [address], [place], [place] [address]    Report Status PENDING  Incomplete  Urine Culture     Status: None   Collection Time: 01/10/24  9:36 AM   Specimen: Urine, Random  Result Value Ref Range Status   Specimen Description   Final    URINE, RANDOM Performed at [facility], [address], [place], [place] [address]    Special Requests   Final    NONE Reflexed from [id number] Performed at [facility], [address], [place], [place] [address]    Culture   Final    NO GROWTH Performed at [facility], [address], [place], [place] [address]    Report Status 01/11/2024 FINAL  Final  Culture, blood (Routine x 2)     Status: None (Preliminary result)   Collection Time: 01/10/24  9:41 AM   Specimen: BLOOD  Result Value Ref Range Status   Specimen Description BLOOD RIGHT ASSIST CONTROL  Final    Special Requests   Final    BOTTLES DRAWN AEROBIC AND ANAEROBIC Blood Culture adequate volume   Culture   Final    NO GROWTH < 24 HOURS Performed at [facility], [address], [place], [place] [address]    Report Status PENDING  Incomplete  Gastrointestinal Panel by PCR , Stool     Status: None   Collection Time: 01/10/24 10:00 AM   Specimen: Stool  Result Value Ref Range Status   Campylobacter species NOT DETECTED NOT DETECTED Final   Plesimonas shigelloides NOT DETECTED NOT DETECTED Final   Salmonella species NOT DETECTED NOT DETECTED Final   Yersinia enterocolitica NOT DETECTED NOT DETECTED Final   Vibrio species NOT DETECTED NOT DETECTED Final   Vibrio cholerae NOT DETECTED NOT DETECTED Final   Enteroaggregative E coli (EAEC) NOT DETECTED NOT DETECTED Final   Enteropathogenic E coli (EPEC) NOT DETECTED NOT DETECTED Final   Enterotoxigenic E coli (ETEC) NOT DETECTED NOT DETECTED Final   Shiga like toxin producing E coli (STEC) NOT DETECTED NOT DETECTED Final   Shigella/Enteroinvasive E coli (EIEC) NOT DETECTED NOT DETECTED Final   Cryptosporidium NOT DETECTED NOT DETECTED Final   Cyclospora cayetanensis NOT DETECTED NOT DETECTED Final   Entamoeba histolytica NOT DETECTED NOT DETECTED Final   Giardia lamblia NOT DETECTED NOT DETECTED Final   Adenovirus F40/41 NOT DETECTED NOT DETECTED Final   Astrovirus NOT DETECTED NOT DETECTED Final   Norovirus GI/GII NOT DETECTED NOT DETECTED Final   Rotavirus A NOT DETECTED NOT DETECTED Final   Sapovirus (I, II, IV, and V) NOT DETECTED NOT DETECTED Final    Comment: Performed at [facility], [address] [place], [place] [address]  C Difficile Quick Screen w PCR reflex     Status: None   Collection Time: 01/10/24 10:00 AM   Specimen: Stool  Result Value Ref Range Status   C Diff antigen NEGATIVE NEGATIVE Final   C Diff toxin NEGATIVE NEGATIVE Final   C Diff interpretation No C. difficile detected.  Final    Comment:  Performed at [facility], [address], [place], [place] [address]  MRSA Next Gen by PCR,  Nasal     Status: None   Collection Time: 01/10/24  6:23 PM   Specimen: Nasal Mucosa; Nasal Swab  Result Value Ref Range Status   MRSA by PCR Next Gen NOT DETECTED NOT DETECTED Final    Comment: (NOTE) The GeneXpert MRSA Assay (FDA approved for NASAL specimens only), is one component of a comprehensive MRSA colonization surveillance program. It is not intended to diagnose MRSA infection nor to guide or monitor treatment for MRSA infections. Test performance is not FDA approved in patients less than 36 years old. Performed at [facility], [address] [place], [place] [address]     Radiology Studies: CT ABDOMEN PELVIS WO CONTRAST Result Date: 01/10/2024 CLINICAL DATA:  Abdominal pain, diarrhea, weakness EXAM: CT ABDOMEN AND PELVIS WITHOUT CONTRAST TECHNIQUE: Multidetector CT imaging of the abdomen and pelvis was performed following the standard protocol without IV contrast. RADIATION DOSE REDUCTION: This exam was performed according to the departmental dose-optimization program which includes automated exposure control, adjustment of the mA and/or kV according to patient size and/or use of iterative reconstruction technique. COMPARISON:  CT abdomen and pelvis dated 03/03/2021, renal ultrasound dated 03/28/2021 FINDINGS: Lower chest: Rounded left lower lobe atelectasis adjacent to the loculated small left pleural effusion. Partially imaged multichamber cardiomegaly. Partially imaged pacemaker leads terminate in the right atrium and ventricle. Hepatobiliary: No focal hepatic lesions. No intra or extrahepatic biliary ductal dilation. Normal gallbladder. Pancreas: Subcentimeter hypodensities in the pancreatic uncinate process. No main ductal dilation. Spleen: Normal in size without focal abnormality. Adrenals/Urinary Tract: 12 mm right adrenal nodule contains gross fat, unchanged from 03/03/2021, likely  myelolipoma. Additional subcentimeter myelolipoma in the left adrenal gland. Again seen are bilateral renal cysts, a few of which are mildly hyperattenuating and may be hemorrhagic/proteinaceous. No hydronephrosis. Punctate nonobstructing right lower pole stone. Underdistended urinary bladder. Stomach/Bowel: Normal appearance of the stomach. No evidence of bowel wall thickening, distention, or inflammatory changes. Colonic diverticulosis without acute diverticulitis. Layering fluid density within the colon. Appendix is not discretely seen. Vascular/Lymphatic: Aortic atherosclerosis. No enlarged abdominal or pelvic lymph nodes. Reproductive: Prostate is unremarkable. Other: No free fluid, fluid collection, or free air. Musculoskeletal: No acute or abnormal lytic or blastic osseous lesions. Partially imaged median sternotomy wires are nondisplaced. Multilevel degenerative changes of the partially imaged thoracic and lumbar spine. Small fat-containing right inguinal hernia. Mild left flank body wall edema. IMPRESSION: 1. No acute abdominopelvic findings. 2. Layering fluid density within the colon in keeping with diarrheal illness. 3. Colonic diverticulosis without acute diverticulitis. 4. Rounded left lower lobe atelectasis adjacent to the loculated small left pleural effusion. 5. Subcentimeter hypodensities in the pancreatic uncinate process, likely side branch IPMNs. Follow-up imaging in 2 years can be considered with contrast-enhanced MRI/MRCP according to the patient's goals of care. 6.  Aortic Atherosclerosis (ICD10-I70.0). Electronically Signed   By: Agustin Cree M.D.   On: 01/10/2024 13:43   DG Chest Port 1 View Result Date: 01/10/2024 CLINICAL DATA:  Sepsis EXAM: PORTABLE CHEST 1 VIEW COMPARISON:  Chest radiograph dated 12/19/2023 FINDINGS: Lines/tubes: Left chest wall pacemaker leads project over the right atrium and ventricle. Lungs: Well inflated lungs. Decreased but persistent patchy left retrocardiac  opacities. Pleura: Unchanged trace blunting of the left costophrenic angle. No pneumothorax. Heart/mediastinum: Similar mildly enlarged, postsurgical cardiomediastinal silhouette. Bones: Median sternotomy wires are nondisplaced. IMPRESSION: 1. Decreased but persistent patchy left retrocardiac opacities, which may represent atelectasis or resolving aspiration or pneumonia. 2. Unchanged trace blunting of the left costophrenic angle, which may represent  a trace pleural effusion or pleural thickening. Electronically Signed   By: Agustin Cree M.D.   On: 01/10/2024 10:13   Scheduled Meds:  Chlorhexidine Gluconate Cloth  6 each Topical Q0600   feeding supplement  237 mL Oral BID BM   pantoprazole (PROTONIX) IV  40 mg Intravenous Q12H   polycarbophil  625 mg Oral Daily   pravastatin  20 mg Oral QPM   Continuous Infusions:  cefTRIAXone (ROCEPHIN)  IV 200 mL/hr at 01/11/24 1826   metronidazole Stopped (01/11/24 1803)   phenylephrine (NEO-SYNEPHRINE) Adult infusion Stopped (01/11/24 1715)    LOS: 1 day   Shon Hale M.D on 01/11/2024 at 7:03 PM  Go to [email or web address] - for contact info  [facility] - Office  [phone]  If 7PM-7AM, please contact night-coverage [email or web address] 01/11/2024, 7:03 PM

## 2024-01-12 DIAGNOSIS — E871 Hypo-osmolality and hyponatremia: Secondary | ICD-10-CM | POA: Diagnosis not present

## 2024-01-12 DIAGNOSIS — R791 Abnormal coagulation profile: Secondary | ICD-10-CM

## 2024-01-12 DIAGNOSIS — N179 Acute kidney failure, unspecified: Secondary | ICD-10-CM | POA: Diagnosis not present

## 2024-01-12 DIAGNOSIS — I480 Paroxysmal atrial fibrillation: Secondary | ICD-10-CM | POA: Diagnosis not present

## 2024-01-12 LAB — BASIC METABOLIC PANEL
Anion gap: 8 (ref 5–15)
BUN: 58 mg/dL — ABNORMAL HIGH (ref 8–23)
CO2: 10 mmol/L — ABNORMAL LOW (ref 22–32)
Calcium: 7.3 mg/dL — ABNORMAL LOW (ref 8.9–10.3)
Chloride: 113 mmol/L — ABNORMAL HIGH (ref 98–111)
Creatinine, Ser: 3.41 mg/dL — ABNORMAL HIGH (ref 0.61–1.24)
GFR, Estimated: 16 mL/min — ABNORMAL LOW (ref >60)
Glucose, Bld: 106 mg/dL — ABNORMAL HIGH (ref 70–99)
Potassium: 3 mmol/L — ABNORMAL LOW (ref 3.5–5.1)
Sodium: 131 mmol/L — ABNORMAL LOW (ref 135–145)

## 2024-01-12 LAB — CBC
HCT: 34.6 % — ABNORMAL LOW (ref 39.0–52.0)
Hemoglobin: 11.2 g/dL — ABNORMAL LOW (ref 13.0–17.0)
MCH: 29.6 pg (ref 26.0–34.0)
MCHC: 32.4 g/dL (ref 30.0–36.0)
MCV: 91.5 fL (ref 80.0–100.0)
Platelets: 46 10*3/uL — ABNORMAL LOW (ref 150–400)
RBC: 3.78 MIL/uL — ABNORMAL LOW (ref 4.22–5.81)
RDW: 15.5 % (ref 11.5–15.5)
WBC: 10 10*3/uL (ref 4.0–10.5)
nRBC: 0 % (ref 0.0–0.2)

## 2024-01-12 LAB — PROTIME-INR
INR: 4.3 (ref 0.8–1.2)
Prothrombin Time: 41.8 s — ABNORMAL HIGH (ref 11.4–15.2)

## 2024-01-12 LAB — MAGNESIUM: Magnesium: 1.3 mg/dL — ABNORMAL LOW (ref 1.7–2.4)

## 2024-01-12 MED ORDER — SODIUM BICARBONATE 650 MG PO TABS: 1300.0000 mg | ORAL_TABLET | Freq: Three times a day (TID) | ORAL | Status: DC

## 2024-01-12 MED ORDER — DICLOFENAC SODIUM 1 % EX GEL
2.0000 g | Freq: Four times a day (QID) | CUTANEOUS | Status: DC | PRN
  Administered 2024-01-12 – 2024-01-14 (×5): 2 g via TOPICAL
  Filled 2024-01-12: qty 100

## 2024-01-12 MED ORDER — RISAQUAD PO CAPS
2.0000 | ORAL_CAPSULE | Freq: Three times a day (TID) | ORAL | Status: DC
  Administered 2024-01-12 – 2024-01-16 (×12): 2 via ORAL
  Filled 2024-01-12 (×12): qty 2

## 2024-01-12 MED ORDER — MAGNESIUM SULFATE 4 GM/100ML IV SOLN
4.0000 g | Freq: Once | INTRAVENOUS | Status: AC
  Administered 2024-01-12: 4 g via INTRAVENOUS
  Filled 2024-01-12: qty 100

## 2024-01-12 MED ORDER — SODIUM CHLORIDE 0.9 % IV SOLN: INTRAVENOUS | Status: DC

## 2024-01-12 MED ORDER — LOPERAMIDE HCL 2 MG PO CAPS
4.0000 mg | ORAL_CAPSULE | ORAL | Status: DC | PRN
  Administered 2024-01-12 – 2024-01-14 (×4): 4 mg via ORAL
  Filled 2024-01-12 (×4): qty 2

## 2024-01-12 MED ORDER — POTASSIUM CHLORIDE CRYS ER 20 MEQ PO TBCR
40.0000 meq | EXTENDED_RELEASE_TABLET | Freq: Once | ORAL | Status: AC
  Administered 2024-01-12: 40 meq via ORAL
  Filled 2024-01-12: qty 2

## 2024-01-12 MED ORDER — BACID PO TABS
2.0000 | ORAL_TABLET | Freq: Three times a day (TID) | ORAL | Status: DC
  Filled 2024-01-12 (×3): qty 2

## 2024-01-12 MED ORDER — PANCRELIPASE (LIP-PROT-AMYL) 12000-38000 UNITS PO CPEP
24000.0000 [IU] | ORAL_CAPSULE | Freq: Three times a day (TID) | ORAL | Status: DC
  Administered 2024-01-12 – 2024-01-16 (×13): 24000 [IU] via ORAL
  Filled 2024-01-12 (×13): qty 2

## 2024-01-12 MED ORDER — SODIUM BICARBONATE 650 MG PO TABS
1300.0000 mg | ORAL_TABLET | Freq: Two times a day (BID) | ORAL | Status: AC
  Administered 2024-01-12: 1300 mg via ORAL
  Administered 2024-01-12: 650 mg via ORAL
  Administered 2024-01-13 – 2024-01-14 (×4): 1300 mg via ORAL
  Filled 2024-01-12 (×6): qty 2

## 2024-01-12 MED ORDER — POTASSIUM CHLORIDE CRYS ER 20 MEQ PO TBCR
40.0000 meq | EXTENDED_RELEASE_TABLET | Freq: Four times a day (QID) | ORAL | Status: AC
  Administered 2024-01-12 (×2): 40 meq via ORAL
  Filled 2024-01-12 (×2): qty 2

## 2024-01-12 NOTE — Plan of Care (Signed)

## 2024-01-12 NOTE — Progress Notes (Signed)
 PROGRESS NOTE  Douglas [name], is a 89 y.o. male, DOB - [birth year], LOV:[id number]  Admit date - 01/10/2024   Admitting Physician Levie Heritage, DO  Outpatient Primary MD for the patient is Carylon Perches, MD  LOS - 2  Chief Complaint  Patient presents with   Diarrhea   Weakness        Brief Narrative:   89 y.o. male with medical history significant of Afib on warfarin, s/p AVR, CAD, HTN, h/o AVM, h/o esophageal ulcer, h/o CHB with pacemaker in place, GERD admitted on 01/10/2023 with severe sepsis with septic shock requiring Neo-Synephrine for pressure support   -Assessment and Plan: 1) hypovolemic shock shock and hemodynamic instability requiring Neo-Synephrine for pressor support---suspect aspiration pneumonia related -- Weaned off Neo-Synephrine as of 01/11/2024 -BP improved with IV fluids -History of Enterococcus faecalis bacteremia from 12/19/2023 -Blood and urine cultures from 01/10/2024 NGTD -Okay to stop IV Rocephin after 01/11/2024 dose-patient in view of persistent diarrhea after recent antibiotic therapy --Stopped Flagyl on 01/11/2024 --C. difficile negative--stool for C. difficile negative --okay to use Imodium prn -Urine culture negative -Okay to stop Flagyl -CT abdomen and pelvis without colitis or diverticulitis, no acute abdominopelvic findings on CT -In retrospect patient probably did Not have frank sepsis--- hypotension was most likely related to volume depletion in the setting of profuse diarrhea for over 2 weeks with hypovolemia/hypotension and metabolic acidosis -- 2)Hyponatremia--- due to GI losses/dehydration -Sodium on admission was 128 -Trending up with hydration  3)AKI----acute kidney injury with metabolic acidosis in the setting of GI losses creatinine on admission=4.33 (peak on 01/10/24) -- baseline creatinine =  1.04 (on 12/24/2023) -- creatinine is now= improving with hydration and improvement in BP,  -c/n  bicarb replacements --renally adjust medications,  avoid nephrotoxic agents / dehydration  / hypotension  4) chronic anemia--- hemoglobin currently above 11 which is close to prior baseline -No obvious bleeding concerns -Continue to monitor  5) chronic thrombocytopenia--platelets slightly lower than baseline -No bleeding concerns, continue to monitor  6)PAFib--supratherapeutic INR noted INR trending down after vitamin K -Flagyl may further increase INR---Flagyl discontinued on 01/11/2024 -Continue to hold Coumadin  7)Hypokalemia/Hypomagnesemia--replace and recheck  8)Diarrhea--persistent diarrhea for about 3 weeks now--leading to volume depletion with dehydration and hypotension as well as electrolyte derangement -Stool culture/GI pathogen and stool for C. difficile negative -Continue as needed Imodium -Get GI consult  9)Social/Ethics--patient and family request DNR/DNI status  Status is: Inpatient   Disposition: The patient is from: Home              Anticipated d/c is to: Home              Anticipated d/c date is: > 3 days              Patient currently is not medically stable to d/c. Barriers: Not Clinically Stable-   Code Status :  -  Code Status: Do not attempt resuscitation (DNR) PRE-ARREST INTERVENTIONS DESIRED   Family Communication:   None at bedside  DVT Prophylaxis  :   - SCDs  SCDs Start: 01/10/24 1851   Lab Results  Component Value Date   PLT 46 (L) 01/12/2024   Inpatient Medications  Scheduled Meds:  acidophilus  2 capsule Oral TID   Chlorhexidine Gluconate Cloth  6 each Topical Q0600   feeding supplement  237 mL Oral BID BM   lipase/protease/amylase  24,000 Units Oral TID AC   pantoprazole (PROTONIX) IV  40 mg Intravenous Q12H   polycarbophil  625 mg Oral Daily   pravastatin  20 mg Oral QPM   sodium bicarbonate  1,300 mg Oral BID   Continuous Infusions:  sodium chloride 100 mL/hr at 01/12/24 1656   phenylephrine (NEO-SYNEPHRINE) Adult infusion Stopped (01/11/24 1715)   PRN Meds:.diclofenac  Sodium, loperamide, ondansetron **OR** ondansetron (ZOFRAN) IV, mouth rinse   Anti-infectives (From admission, onward)    Start     Dose/Rate Route Frequency Ordered Stop   01/10/24 1815  cefTRIAXone (ROCEPHIN) 2 g in sodium chloride 0.9 % 100 mL IVPB  Status:  Discontinued        2 g 200 mL/hr over 30 Minutes Intravenous Every 24 hours 01/10/24 1802 01/12/24 1046   01/10/24 1815  metroNIDAZOLE (FLAGYL) IVPB 500 mg  Status:  Discontinued        500 mg 100 mL/hr over 60 Minutes Intravenous Every 12 hours 01/10/24 1802 01/11/24 1916       Subjective: [name] today has no fevers, no emesis,  No chest pain,   - Diarrhea persist requiring Imodium -Appetite is not great -Patient's son and daughter at bedside -Patient's daughter-in-law Mrs. [name] also at bedside -Poor urine output noted  Objective: Vitals:   01/12/24 1530 01/12/24 1540 01/12/24 1600 01/12/24 1630  BP: (!) 112/41  (!) 130/50 (!) 118/39  Pulse: 70  70 70  Resp: 13  (!) 25 19  Temp:  98.3 F (36.8 C)    TempSrc:  Oral    SpO2: 100%  100%   Weight:      Height:        Intake/Output Summary (Last 24 hours) at 01/12/2024 1807 Last data filed at 01/12/2024 1656 Gross per 24 hour  Intake 1404.46 ml  Output 450 ml  Net 954.46 ml   Filed Weights   01/10/24 0926 01/10/24 1854  Weight: 97 kg 84.6 kg    Physical Exam Gen:- Awake Alert,  in no apparent distress  HEENT:- [place].AT, No sclera icterus Neck-Supple Neck,No JVD,.  Lungs-  CTAB , fair symmetrical air movement CV- S1, S2 normal, regular , 2/6 SM Abd-  +ve B.Sounds, Abd Soft, No tenderness,    Extremity/Skin:- No  edema, pedal pulses present  Psych-affect is appropriate, oriented x3 Neuro-generalized weakness, no new focal deficits, no tremors  Data Reviewed: I have personally reviewed following labs and imaging studies  CBC: Recent Labs  Lab 01/10/24 0936 01/11/24 0200 01/12/24 0906  WBC 9.9 8.2 10.0  NEUTROABS 5.6  --   --   HGB  13.2 11.6* 11.2*  HCT 41.7 35.5* 34.6*  MCV 92.3 91.5 91.5  PLT 117* 100* 46*   Basic Metabolic Panel: Recent Labs  Lab 01/10/24 1747 01/10/24 2111 01/11/24 0200 01/11/24 0532 01/12/24 0414  NA 130* 129* 129* 132* 131*  K 4.1 3.7 3.5 4.0 3.0*  CL 107 108 108 111 113*  CO2 10* 9* 7* 9* 10*  GLUCOSE 126* 122* 135* 119* 106*  BUN 55* 56* 56* 58* 58*  CREATININE 4.03* 3.96* 4.10* 3.98* 3.41*  CALCIUM 7.6* 7.5* 7.3* 7.9* 7.3*  MG  --   --   --   --  1.3*   GFR: Estimated Creatinine Clearance: 13.2 mL/min (A) (by C-G formula based on SCr of 3.41 mg/dL (H)). Liver Function Tests: Recent Labs  Lab 01/10/24 0936  AST 14*  ALT 10  ALKPHOS 100  BILITOT 0.6  PROT 5.9*  ALBUMIN 2.9*   Recent Results (from the past 240 hours)  Culture, blood (Routine x 2)  Status: None (Preliminary result)   Collection Time: 01/10/24  9:36 AM   Specimen: BLOOD  Result Value Ref Range Status   Specimen Description BLOOD LEFT ASSIST CONTROL  Final   Special Requests   Final    BOTTLES DRAWN AEROBIC AND ANAEROBIC Blood Culture results may not be optimal due to an inadequate volume of blood received in culture bottles   Culture   Final    NO GROWTH 2 DAYS Performed at [facility] [address], [place], [place] [address]    Report Status PENDING  Incomplete  Urine Culture     Status: None   Collection Time: 01/10/24  9:36 AM   Specimen: Urine, Random  Result Value Ref Range Status   Specimen Description   Final    URINE, RANDOM Performed at [facility], [address], [place], [place] [address]    Special Requests   Final    NONE Reflexed from [phone] Performed at [facility], [address], [place], [place] [address]    Culture   Final    NO GROWTH Performed at [facility], [address], [place], [place] [address]    Report Status 01/11/2024 FINAL  Final  Culture, blood (Routine x 2)     Status: None (Preliminary result)   Collection Time: 01/10/24  9:41 AM    Specimen: BLOOD  Result Value Ref Range Status   Specimen Description BLOOD RIGHT ASSIST CONTROL  Final   Special Requests   Final    BOTTLES DRAWN AEROBIC AND ANAEROBIC Blood Culture adequate volume   Culture   Final    NO GROWTH 2 DAYS Performed at [facility], [address] [place], [place] [address]    Report Status PENDING  Incomplete  Gastrointestinal Panel by PCR , Stool     Status: None   Collection Time: 01/10/24 10:00 AM   Specimen: Stool  Result Value Ref Range Status   Campylobacter species NOT DETECTED NOT DETECTED Final   Plesimonas shigelloides NOT DETECTED NOT DETECTED Final   Salmonella species NOT DETECTED NOT DETECTED Final   Yersinia enterocolitica NOT DETECTED NOT DETECTED Final   Vibrio species NOT DETECTED NOT DETECTED Final   Vibrio cholerae NOT DETECTED NOT DETECTED Final   Enteroaggregative E coli (EAEC) NOT DETECTED NOT DETECTED Final   Enteropathogenic E coli (EPEC) NOT DETECTED NOT DETECTED Final   Enterotoxigenic E coli (ETEC) NOT DETECTED NOT DETECTED Final   Shiga like toxin producing E coli (STEC) NOT DETECTED NOT DETECTED Final   Shigella/Enteroinvasive E coli (EIEC) NOT DETECTED NOT DETECTED Final   Cryptosporidium NOT DETECTED NOT DETECTED Final   Cyclospora cayetanensis NOT DETECTED NOT DETECTED Final   Entamoeba histolytica NOT DETECTED NOT DETECTED Final   Giardia lamblia NOT DETECTED NOT DETECTED Final   Adenovirus F40/41 NOT DETECTED NOT DETECTED Final   Astrovirus NOT DETECTED NOT DETECTED Final   Norovirus GI/GII NOT DETECTED NOT DETECTED Final   Rotavirus A NOT DETECTED NOT DETECTED Final   Sapovirus (I, II, IV, and V) NOT DETECTED NOT DETECTED Final    Comment: Performed at [facility], [address] [place], [place] [address]  C Difficile Quick Screen w PCR reflex     Status: None   Collection Time: 01/10/24 10:00 AM   Specimen: Stool  Result Value Ref Range Status   C Diff antigen NEGATIVE NEGATIVE Final   C Diff  toxin NEGATIVE NEGATIVE Final   C Diff interpretation No C. difficile detected.  Final    Comment: Performed at [facility], [address] [place], [place] [address]  MRSA Next Gen by PCR, Nasal     Status: None   Collection Time: 01/10/24  6:23 PM   Specimen: Nasal Mucosa; Nasal Swab  Result Value Ref Range Status   MRSA by PCR Next Gen NOT DETECTED NOT DETECTED Final    Comment: (NOTE) The GeneXpert MRSA Assay (FDA approved for NASAL specimens only), is one component of a comprehensive MRSA colonization surveillance program. It is not intended to diagnose MRSA infection nor to guide or monitor treatment for MRSA infections. Test performance is not FDA approved in patients less than 23 years old. Performed at [facility], [address] [place], [place] [address]     Radiology Studies: No results found.  Scheduled Meds:  acidophilus  2 capsule Oral TID   Chlorhexidine Gluconate Cloth  6 each Topical Q0600   feeding supplement  237 mL Oral BID BM   lipase/protease/amylase  24,000 Units Oral TID AC   pantoprazole (PROTONIX) IV  40 mg Intravenous Q12H   polycarbophil  625 mg Oral Daily   pravastatin  20 mg Oral QPM   sodium bicarbonate  1,300 mg Oral BID   Continuous Infusions:  sodium chloride 100 mL/hr at 01/12/24 1656   phenylephrine (NEO-SYNEPHRINE) Adult infusion Stopped (01/11/24 1715)    LOS: 2 days   Shon Hale M.D on 01/12/2024 at 6:07 PM  Go to [email or web address] - for contact info  [facility] - Office  [phone]  If 7PM-7AM, please contact night-coverage [email or web address] 01/12/2024, 6:07 PM

## 2024-01-12 NOTE — Progress Notes (Signed)
 Date and time results received: 01/12/24 0748 (use smartphrase ".now" to insert current time)  Test: INR  Critical Value: 4.3  Name of Provider Notified: Dr Marisa Severin  Orders Received? Or Actions Taken?:  No new orders currently

## 2024-01-12 NOTE — Progress Notes (Signed)
 Pt's daughter brought copy of Living Will and Power of [address]. Copy of each in pt's paper chart.

## 2024-01-13 ENCOUNTER — Telehealth: Payer: Self-pay

## 2024-01-13 ENCOUNTER — Inpatient Hospital Stay (HOSPITAL_COMMUNITY)

## 2024-01-13 ENCOUNTER — Institutional Professional Consult (permissible substitution) (INDEPENDENT_AMBULATORY_CARE_PROVIDER_SITE_OTHER): Payer: Medicare Other

## 2024-01-13 DIAGNOSIS — R933 Abnormal findings on diagnostic imaging of other parts of digestive tract: Secondary | ICD-10-CM

## 2024-01-13 DIAGNOSIS — D696 Thrombocytopenia, unspecified: Secondary | ICD-10-CM | POA: Diagnosis not present

## 2024-01-13 DIAGNOSIS — R1310 Dysphagia, unspecified: Secondary | ICD-10-CM | POA: Diagnosis not present

## 2024-01-13 DIAGNOSIS — E871 Hypo-osmolality and hyponatremia: Secondary | ICD-10-CM | POA: Diagnosis not present

## 2024-01-13 DIAGNOSIS — E860 Dehydration: Secondary | ICD-10-CM

## 2024-01-13 DIAGNOSIS — N179 Acute kidney failure, unspecified: Secondary | ICD-10-CM | POA: Diagnosis not present

## 2024-01-13 DIAGNOSIS — I480 Paroxysmal atrial fibrillation: Secondary | ICD-10-CM | POA: Diagnosis not present

## 2024-01-13 DIAGNOSIS — R197 Diarrhea, unspecified: Secondary | ICD-10-CM | POA: Diagnosis not present

## 2024-01-13 LAB — CBC WITH DIFFERENTIAL/PLATELET
Abs Immature Granulocytes: 0.09 10*3/uL — ABNORMAL HIGH (ref 0.00–0.07)
Basophils Absolute: 0 10*3/uL (ref 0.0–0.1)
Basophils Relative: 0 %
Eosinophils Absolute: 1 10*3/uL — ABNORMAL HIGH (ref 0.0–0.5)
Eosinophils Relative: 9 %
HCT: 32.9 % — ABNORMAL LOW (ref 39.0–52.0)
Hemoglobin: 10.8 g/dL — ABNORMAL LOW (ref 13.0–17.0)
Immature Granulocytes: 1 %
Lymphocytes Relative: 10 %
Lymphs Abs: 1.1 10*3/uL (ref 0.7–4.0)
MCH: 29.4 pg (ref 26.0–34.0)
MCHC: 32.8 g/dL (ref 30.0–36.0)
MCV: 89.6 fL (ref 80.0–100.0)
Monocytes Absolute: 1.9 10*3/uL — ABNORMAL HIGH (ref 0.1–1.0)
Monocytes Relative: 17 %
Neutro Abs: 7.2 10*3/uL (ref 1.7–7.7)
Neutrophils Relative %: 63 %
Platelets: 48 10*3/uL — ABNORMAL LOW (ref 150–400)
RBC: 3.67 MIL/uL — ABNORMAL LOW (ref 4.22–5.81)
RDW: 15.2 % (ref 11.5–15.5)
Smear Review: DECREASED
WBC: 11.3 10*3/uL — ABNORMAL HIGH (ref 4.0–10.5)
nRBC: 0 % (ref 0.0–0.2)

## 2024-01-13 LAB — BASIC METABOLIC PANEL
Anion gap: 9 (ref 5–15)
BUN: 54 mg/dL — ABNORMAL HIGH (ref 8–23)
CO2: 11 mmol/L — ABNORMAL LOW (ref 22–32)
Calcium: 7.8 mg/dL — ABNORMAL LOW (ref 8.9–10.3)
Chloride: 118 mmol/L — ABNORMAL HIGH (ref 98–111)
Creatinine, Ser: 2.83 mg/dL — ABNORMAL HIGH (ref 0.61–1.24)
GFR, Estimated: 20 mL/min — ABNORMAL LOW (ref >60)
Glucose, Bld: 148 mg/dL — ABNORMAL HIGH (ref 70–99)
Potassium: 3.7 mmol/L (ref 3.5–5.1)
Sodium: 138 mmol/L (ref 135–145)

## 2024-01-13 LAB — PROTIME-INR
INR: 2.6 — ABNORMAL HIGH (ref 0.8–1.2)
Prothrombin Time: 28 s — ABNORMAL HIGH (ref 11.4–15.2)

## 2024-01-13 MED ORDER — OXYCODONE HCL 5 MG PO TABS
5.0000 mg | ORAL_TABLET | Freq: Three times a day (TID) | ORAL | Status: DC | PRN
  Administered 2024-01-13: 5 mg via ORAL
  Filled 2024-01-13: qty 1

## 2024-01-13 MED ORDER — SODIUM CHLORIDE 0.9 % IV SOLN: INTRAVENOUS | Status: AC

## 2024-01-13 MED ORDER — POTASSIUM CHLORIDE CRYS ER 20 MEQ PO TBCR
40.0000 meq | EXTENDED_RELEASE_TABLET | Freq: Once | ORAL | Status: AC
  Administered 2024-01-13: 40 meq via ORAL
  Filled 2024-01-13: qty 2

## 2024-01-13 MED ORDER — ACETAMINOPHEN 325 MG PO TABS
650.0000 mg | ORAL_TABLET | Freq: Four times a day (QID) | ORAL | Status: DC | PRN
  Administered 2024-01-13 – 2024-01-15 (×2): 650 mg via ORAL
  Filled 2024-01-13 (×2): qty 2

## 2024-01-13 NOTE — Consult Note (Signed)
 Gastroenterology Consult   Referring Provider: Dr. Shon Hale Primary Care Physician:  Carylon Perches, MD Primary Gastroenterologist:  Dr. Levon Hedger  Patient ID: Douglas [name]; [id number]; 04-17-1928   Admit date: 01/10/2024  LOS: 3 days   Date of Consultation: 01/13/2024  Reason for Consultation:  diarrhea   History of Present Illness   Douglas [name] is a 89 y.o. year old male with past medical history of Afib on warfarin, s/p AVR, CAD, HTN, pacemaker placement, GERD, h/of gastric AVM in 2017, admitted to [facility] on 01/10/24 with hypovolemic shock, hemodynamic instability requiring pressor support initially in setting of aspiration pneumonia, dehydration, AKI. GI was requested due to diarrhea for the past 3 weeks.   Notably, patient was discharged 3/4 from the hospital after admission for sepsis due to pneumonia (enterobacter faecalis bacteremia).  Seen as outpatient December 26, 2023 by Dr. Levon Hedger with reports of constipation. History of chronic constipation. Resumed Linzess 145 mcg daily prn at that visit. He notes in the past that he would get bound up then take Linzess, which would work almost too well and have continued loose stool. He finds it difficult to give a complete history, but he notes diarrhea starting shortly after his last discharge 3/4 and continuing through this current hospitalization. He is unable to tell how many loose stools and believes he has not had a BM this morning. Denies nausea, vomiting, or abdominal pain. No overt GI bleeding. +Flatus. Feels like his abdomen is tighter this morning. Per nursing staff, he had 2 large watery stools overnight and none this morning thus far.   He has had exposure to multiple antibiotics in setting of bacteremia previously and again this admission with Rocephin and Flagyl, both of which have been completed at end of 3/22.   Cdiff negative 3/21, GI path panel negative 3/21.     01/10/24 CT abd/pelvis without contrast: no acute  findings, fluid density in colon consistent with diarrheal illness, subcentimeter hypodensities in pancreatic uncinate process likely side branch IPMNs, consider follow-up imaging in 2 years.    Last EGD: 04/2016 Normal esophagus, blood found in lesser curvature due to large AVM. Epinephrine injection and APC used  to achieve hemostasis. 3 clips placed, Normal duodenum.   Past Medical History:  Diagnosis Date   Acute upper GI bleed 05/18/2016   Aneurysm of thoracic aorta (HCC)    Ascending. 5.7 x 5.5cm - Bentall procedure   Aortic stenosis    Bioprosthetic AVR   Arthritis    Atrial fibrillation (HCC)    Documented on pacer interrogation 6/13   Complete heart block (HCC)    Status post pacemaker placement   Coronary atherosclerosis of native coronary artery    Multivessel - LIMA to LAD, SVG to OM, SVG to PDA   DJD (degenerative joint disease)    Esophageal dysmotility    Essential hypertension    Fibromyalgia    Gout    H/O arteriovenous malformation (AVM)    H/O hiatal hernia    Hemorrhoid    History of esophageal ulcer    History of skin cancer    Hx of adenomatous colonic polyps    Iron deficiency anemia due to chronic blood loss    Melena    Pacemaker-Medtronic    PMR (polymyalgia rheumatica) (HCC)    SIRS (systemic inflammatory response syndrome) (HCC) 01/16/2015    Past Surgical History:  Procedure Laterality Date   AORTIC VALVE REPLACEMENT  01/24/12   APPENDECTOMY  BACK SURGERY     1965 or 1966 lumbar back surgery   BENTALL PROCEDURE  01/24/2012   Procedure: BENTALL PROCEDURE;  Surgeon: Kerin Perna, MD;  Location: [facility] OR;  Service: Open Heart Surgery;  Laterality: N/A;   BILATERAL KNEE ARTHROSCOPY     CATARACT EXTRACTION W/PHACO Left 12/03/2016   Procedure: CATARACT EXTRACTION PHACO AND INTRAOCULAR LENS PLACEMENT LEFT EYE;  Surgeon: Gemma Payor, MD;  Location: [facility] ORS;  Service: Ophthalmology;  Laterality: Left;  CDE:  10.87   CATARACT EXTRACTION W/PHACO Right  12/13/2016   Procedure: CATARACT EXTRACTION PHACO AND INTRAOCULAR LENS PLACEMENT (IOC);  Surgeon: Gemma Payor, MD;  Location: [facility] ORS;  Service: Ophthalmology;  Laterality: Right;  right cde 11.78   CORONARY ARTERY BYPASS GRAFT  01/24/2012   Procedure: CORONARY ARTERY BYPASS GRAFTING (CABG);  Surgeon: Kerin Perna, MD;  Location: [facility] OR;  Service: Open Heart Surgery;  Laterality: N/A;   ESOPHAGOGASTRODUODENOSCOPY  06/17/2012   Procedure: ESOPHAGOGASTRODUODENOSCOPY (EGD);  Surgeon: Malissa Hippo, MD;  Location: [facility];  Service: Endoscopy;  Laterality: N/A;  ED   ESOPHAGOGASTRODUODENOSCOPY N/A 05/19/2016   Procedure: ESOPHAGOGASTRODUODENOSCOPY (EGD);  Surgeon: West Bali, MD;  Location: [facility];  Service: Endoscopy;  Laterality: N/A;   HEMORROIDECTOMY     HERNIA REPAIR     Left inguinal hernia repair     PACEMAKER PLACEMENT     Medtronic 4/13 - Dr. Johney Frame   PERMANENT PACEMAKER INSERTION N/A 02/01/2012   Procedure: PERMANENT PACEMAKER INSERTION;  Surgeon: Hillis Range, MD; Medtronic Adapta L model ADDRL 1 (serial number [id number])     TRANSESOPHAGEAL ECHOCARDIOGRAM (CATH LAB) N/A 12/23/2023   Procedure: TRANSESOPHAGEAL ECHOCARDIOGRAM;  Surgeon: Jodelle Red, MD;  Location: [facility] INVASIVE CV LAB;  Service: Cardiovascular;  Laterality: N/A;    Prior to Admission medications   Medication Sig Start Date End Date Taking? Authorizing Provider  allopurinol (ZYLOPRIM) 300 MG tablet Take 300 mg by mouth daily. Take 1  daily   Yes [provider]  diclofenac Sodium (VOLTAREN) 1 % GEL Apply 2 g topically 4 (four) times daily. Apply to knees 12/24/23  Yes Rai, Ripudeep K, MD  diphenoxylate-atropine (LOMOTIL) 2.5-0.025 MG tablet Take 1 tablet by mouth 4 (four) times daily as needed for diarrhea or loose stools. 01/06/24  Yes [provider]  furosemide (LASIX) 20 MG tablet Take 20 mg by mouth every other day. 11/25/23  Yes [provider]  metoprolol succinate  (TOPROL-XL) 25 MG 24 hr tablet TAKE (2) TABLETS BY MOUTH DAILY. 10/17/23  Yes Jonelle Sidle, MD  pantoprazole (PROTONIX) 40 MG tablet TAKE ONE TABLET BY MOUTH ONCE DAILY IN THE MORNING. 11/24/15  Yes Jonelle Sidle, MD  pravastatin (PRAVACHOL) 20 MG tablet Take 20 mg by mouth every evening.  10/27/15  Yes [provider]  warfarin (COUMADIN) 5 MG tablet TAKE 1 TABLET BY MOUTH ONCE DAILY OR AS DIRECTED. Patient taking differently: Take 2.5 mg by mouth one time only at 4 PM. Take 1 tablet by mouth on Monday, Wednesday, and Friday, and then 0.5 tablet by mouth daily on all the other days 10/01/23  Yes Jonelle Sidle, MD    Current Facility-Administered Medications  Medication Dose Route Frequency Provider Last Rate Last Admin   0.9 %  sodium chloride infusion   Intravenous Continuous Emokpae, Courage, MD       acetaminophen (TYLENOL) tablet 650 mg  650 mg Oral Q6H PRN Shon Hale, MD  acidophilus (RISAQUAD) capsule 2 capsule  2 capsule Oral TID Orson Aloe, [facility]   2 capsule at 01/13/24 1610   Chlorhexidine Gluconate Cloth 2 % PADS 6 each  6 each Topical Q0600 Levie Heritage, DO   6 each at 01/13/24 9604   diclofenac Sodium (VOLTAREN) 1 % topical gel 2 g  2 g Topical QID PRN Shon Hale, MD   2 g at 01/13/24 5409   feeding supplement (ENSURE ENLIVE / ENSURE PLUS) liquid 237 mL  237 mL Oral BID BM Adefeso, Oladapo, DO   237 mL at 01/12/24 1656   lipase/protease/amylase (CREON) capsule 24,000 Units  24,000 Units Oral TID Edwin Dada, Courage, MD   24,000 Units at 01/13/24 8119   loperamide (IMODIUM) capsule 4 mg  4 mg Oral Q4H PRN Shon Hale, MD   4 mg at 01/13/24 0839   ondansetron (ZOFRAN) tablet 4 mg  4 mg Oral Q6H PRN Levie Heritage, DO       Or   ondansetron [facility]) injection 4 mg  4 mg Intravenous Q6H PRN Levie Heritage, DO       Oral care mouth rinse  15 mL Mouth Rinse PRN Levie Heritage, DO       pantoprazole (PROTONIX) injection 40 mg  40  mg Intravenous Q12H Levie Heritage, DO   40 mg at 01/13/24 1478   polycarbophil (FIBERCON) tablet 625 mg  625 mg Oral Daily Levie Heritage, DO   625 mg at 01/12/24 1120   pravastatin (PRAVACHOL) tablet 20 mg  20 mg Oral QPM Levie Heritage, DO   20 mg at 01/12/24 1655   sodium bicarbonate tablet 1,300 mg  1,300 mg Oral BID Shon Hale, MD   1,300 mg at 01/13/24 2956    Allergies as of 01/10/2024 - Review Complete 01/10/2024  Allergen Reaction Noted   Imdur [isosorbide nitrate] Other (See Comments) 12/28/2015    Family History  Problem Relation Age of Onset   Kidney disease Mother    Heart disease Father    Anesthesia problems Neg Hx    Hypotension Neg Hx    Malignant hyperthermia Neg Hx    Pseudochol deficiency Neg Hx     Social History   Socioeconomic History   Marital status: Married    Spouse name: Not on file   Number of children: 4   Years of education: Not on file   Highest education level: Not on file  Occupational History   Occupation: Retired    Comment: Engineer, maintenance  Tobacco Use   Smoking status: Never   Smokeless tobacco: Never  Vaping Use   Vaping status: Never Used  Substance and Sexual Activity   Alcohol use: No    Alcohol/week: 0.0 standard drinks of alcohol   Drug use: No   Sexual activity: Never    Birth control/protection: None  Other Topics Concern   Not on file  Social History Narrative   Lives with wife, has been married since 73.   Social Drivers of Corporate investment banker Strain: Not on file  Food Insecurity: No Food Insecurity (01/10/2024)   Hunger Vital Sign    Worried About Running Out of Food in the Last Year: Never true    Ran Out of Food in the Last Year: Never true  Transportation Needs: No Transportation Needs (01/10/2024)   PRAPARE - Administrator, Civil Service (Medical): No    Lack of Transportation (Non-Medical): No  Physical  Activity: Not on file  Stress: Not on file  Social Connections:  Moderately Isolated (01/10/2024)   Social Connection and Isolation Panel [NHANES]    Frequency of Communication with Friends and Family: Never    Frequency of Social Gatherings with Friends and Family: Once a week    Attends Religious Services: 1 to 4 times per year    Active Member of [employer] or Organizations: No    Attends Banker Meetings: Never    Marital Status: Married  Catering manager Violence: Not At Risk (01/10/2024)   Humiliation, Afraid, Rape, and Kick questionnaire    Fear of Current or Ex-Partner: No    Emotionally Abused: No    Physically Abused: No    Sexually Abused: No     Review of Systems   Gen: see HPI CV: Denies chest pain, heart palpitations, syncope, edema  Resp: Denies shortness of breath with rest, cough, wheezing, coughing up blood, and pleurisy. GI: see HPI GU : Denies urinary burning, blood in urine, urinary frequency, and urinary incontinence. MS: Denies joint pain, limitation of movement, swelling, cramps, and atrophy.  Derm: Denies rash, itching, dry skin, hives. Psych: Denies depression, anxiety, memory loss, hallucinations, and confusion. Heme: Denies bruising or bleeding Neuro:  Denies any headaches, dizziness, paresthesias, shaking  Physical Exam   Vital Signs in last 24 hours: Temp:  [97.7 F (36.5 C)-98.4 F (36.9 C)] 97.7 F (36.5 C) (03/24 0834) Pulse Rate:  [69-103] 74 (03/24 0834) Resp:  [13-34] 19 (03/24 0834) BP: (104-144)/(27-65) 134/37 (03/24 0800) SpO2:  [71 %-100 %] 95 % (03/24 0834) Last BM Date : 01/12/24  General:   Alert,  frail, chronically ill-appearing Head:  Normocephalic and atraumatic. Eyes:  Sclera clear, no icterus.    Ears:  Normal auditory acuity. Lungs:  Clear throughout to auscultation.   Heart:  S1 S2 present with murmur Abdomen:  +BS, distended and round but soft, no TTP, rebound, or guarding Rectal: deferred   Msk:  Symmetrical without gross deformities. Normal posture. Neurologic:  Alert  and  oriented x4. Psych:  Alert and cooperative. Normal mood and affect.  Intake/Output from previous day: 03/23 0701 - 03/24 0700 In: 1208 [P.O.:720; I.V.:239.7; IV Piggyback:248.4] Out: 725 [Urine:725] Intake/Output this shift: No intake/output data recorded.    Labs/Studies   Recent Labs Recent Labs    01/10/24 0936 01/11/24 0200 01/12/24 0906  WBC 9.9 8.2 10.0  HGB 13.2 11.6* 11.2*  HCT 41.7 35.5* 34.6*  PLT 117* 100* 46*   BMET Recent Labs    01/11/24 0532 01/12/24 0414 01/13/24 0416  NA 132* 131* 138  K 4.0 3.0* 3.7  CL 111 113* 118*  CO2 9* 10* 11*  GLUCOSE 119* 106* 148*  BUN 58* 58* 54*  CREATININE 3.98* 3.41* 2.83*  CALCIUM 7.9* 7.3* 7.8*   LFT Recent Labs    01/10/24 0936  PROT 5.9*  ALBUMIN 2.9*  AST 14*  ALT 10  ALKPHOS 100  BILITOT 0.6   PT/INR Recent Labs    01/12/24 0414 01/13/24 0416  LABPROT 41.8* 28.0*  INR 4.3* 2.6*   C-Diff Recent Labs    01/10/24 1000  CDIFFTOX NEGATIVE      Assessment   CANTON YEARBY is a 89 y.o. year old male  with past medical history of afib on warfarin, AVR, CAD, HTN, pacemaker placement, GERD, h/o gastric AVM in 2017, previous admission late Feb through early March with sepsis due to pneumonia in setting of enterococcus faecalis bacteremia  and discharged 3/4, now readmitted to [facility] on 01/10/24 with hypovolemic shock, hemodynamic instability requiring pressor support initially in setting of aspiration pneumonia, dehydration, AKI. GI was requested due to diarrhea for the past 3 weeks.   Diarrhea: patient notes baseline history of chronic constipation that has responded to novel dosing of Linzess prn and from his description overshooting the mark at times, now with diarrhea for the past 3 weeks after exposure to multiple antibiotics. Per nursing staff, he had 2 large stools overnight and no BM thus far on day shift. In setting of antibiotics exposure, Cdiff certainly was suspected but both toxin and  antigen are negative. GI path negative. Appears to be dealing with antibiotic-associated diarrhea. Although Cdiff toxin and antigen negative, would still be suspicious as these tests can be falsely negative in setting of concomitant antibiotics. Notably, he also is distended on exam with concerns for evolving ileus. Will check abdominal xray. I note pancreatic enzymes were started, which can be increased to achieve weight-based dosing if necessary. Imodium continues prn.   Likely side branch IPMNs on CT imaging: can consider outpatient surveillance depending on clinical status in 2 years. Would favor conservative management.   Thrombocytopenia: progressively declining this admission, with platelet count 46 yesterday. Recheck today.     Plan / Recommendations    Abdominal xray Recheck CBC Strict I/Os: discussed with nursing Hold off on fiber now: will d/c If continues with persistent loose stools, need to consider potentially empirically treating with vanc or Dificid Further recommendations to follow     01/13/2024, 9:11 AM  Gelene Mink, PhD, ANP-BC [facility]

## 2024-01-13 NOTE — Progress Notes (Signed)
 PROGRESS NOTE  Douglas [name], is a 89 y.o. male, DOB - November 07, 1927, YQM:[id number]  Admit date - 01/10/2024   Admitting Physician Levie Heritage, DO  Outpatient Primary MD for the patient is Carylon Perches, MD  LOS - 3  Chief Complaint  Patient presents with   Diarrhea   Weakness      Brief Narrative:   89 y.o. male with medical history significant of Afib on warfarin, s/p AVR, CAD, HTN, h/o AVM, h/o esophageal ulcer, h/o CHB with pacemaker in place, GERD admitted on 01/10/2023 with severe sepsis with septic shock requiring Neo-Synephrine for pressure support   -Assessment and Plan: 1)Hypovolemic shock shock and hemodynamic instability requiring Neo-Synephrine for pressor support---suspect aspiration pneumonia related -- Weaned off Neo-Synephrine as of 01/11/2024 -BP improved with IV fluids -History of Enterococcus faecalis bacteremia from 12/19/2023 -Blood and urine cultures from 01/10/2024 NGTD -Okay to stop IV Rocephin after 01/11/2024 dose-patient in view of persistent diarrhea after recent antibiotic therapy --Stopped Flagyl on 01/11/2024 --C. difficile negative--stool for C. difficile negative --okay to use Imodium prn -Urine culture negative -stopped Flagyl -CT abdomen and pelvis without colitis or diverticulitis, no acute abdominopelvic findings on CT -In retrospect patient probably did Not have frank sepsis--- hypotension was most likely related to volume depletion in the setting of profuse diarrhea for over 2 weeks with hypovolemia/hypotension and metabolic acidosis -- 2)Hypothermia--patient developing hypothermia again -BP stable -Give warming blanket -Check blood cultures  3)Hyponatremia--- due to GI losses/dehydration -Sodium on admission was 128 -Sodium normalized with hydration  4)AKI----acute kidney injury with metabolic acidosis in the setting of GI losses creatinine on admission=4.33 (peak on 01/10/24) -- baseline creatinine =  1.04 (on 12/24/2023) -- creatinine is  improving with hydration and improvement in BP (creatinine is currently down to 2.83 from peak of 4.33) -c/n  bicarb replacements -Urine output is Not great --renally adjust medications, avoid nephrotoxic agents / dehydration  / hypotension   5) chronic thrombocytopenia--platelets are lower than baseline -No bleeding concerns, - continue to monitor  6)PAFib--supratherapeutic INR noted INR trending down after vitamin K -Flagyl may further increase INR---Flagyl discontinued on 01/11/2024 -Continue to hold Coumadin -Okay to start IV heparin bridge when INR gets close to 2  7)Hypokalemia/Hypomagnesemia--replace and recheck  8)Diarrhea--persistent diarrhea for about 3 weeks now--leading to volume depletion with dehydration and hypotension as well as electrolyte derangement -Stool culture/GI pathogen and stool for C. difficile negative -Continue as needed Imodium -GI consult appreciated  9)Chronic anemia--- hemoglobin currently above  10 which is close to prior baseline -No obvious bleeding concerns -Continue to monitor  10)Social/Ethics--plan of care and advanced directive discussed with patient to find members  -patient and family request DNR/DNI status  Status is: Inpatient   Disposition: The patient is from: Home              Anticipated d/c is to: Home              Anticipated d/c date is: > 3 days              Patient currently is not medically stable to d/c. Barriers: Not Clinically Stable-   Code Status :  -  Code Status: Do not attempt resuscitation (DNR) PRE-ARREST INTERVENTIONS DESIRED   Family Communication:   Was discussed with son Brett Canales, daughter-in-law Elease Hashimoto, and -daughter Clydie Braun  DVT Prophylaxis  :   - SCDs  Place and maintain sequential compression device Start: 01/13/24 1307 Place TED hose Start: 01/13/24 1307 SCDs Start: 01/10/24 1851  Lab Results  Component Value Date   PLT 48 (L) 01/13/2024   Inpatient Medications  Scheduled Meds:  acidophilus  2  capsule Oral TID   Chlorhexidine Gluconate Cloth  6 each Topical Q0600   feeding supplement  237 mL Oral BID BM   lipase/protease/amylase  24,000 Units Oral TID AC   pantoprazole (PROTONIX) IV  40 mg Intravenous Q12H   pravastatin  20 mg Oral QPM   sodium bicarbonate  1,300 mg Oral BID   Continuous Infusions:  sodium chloride 83 mL/hr at 01/13/24 1516   PRN Meds:.acetaminophen, diclofenac Sodium, loperamide, ondansetron **OR** ondansetron (ZOFRAN) IV, mouth rinse, oxyCODONE   Anti-infectives (From admission, onward)    Start     Dose/Rate Route Frequency Ordered Stop   01/10/24 1815  cefTRIAXone (ROCEPHIN) 2 g in sodium chloride 0.9 % 100 mL IVPB  Status:  Discontinued        2 g 200 mL/hr over 30 Minutes Intravenous Every 24 hours 01/10/24 1802 01/12/24 1046   01/10/24 1815  metroNIDAZOLE (FLAGYL) IVPB 500 mg  Status:  Discontinued        500 mg 100 mL/hr over 60 Minutes Intravenous Every 12 hours 01/10/24 1802 01/11/24 1916       Subjective: Douglas [name] today has no emesis,  No chest pain,   - -Diarrhea persist despite Imodium -Urine output and oral intake is not great -Abdominal x-rays on 01/13/2024 without acute findings -Patient developed hypothermia--- warming blanket applied  Objective: Vitals:   01/13/24 1000 01/13/24 1300 01/13/24 1400 01/13/24 1500  BP: (!) 109/36 105/76 (!) 110/41 (!) 119/43  Pulse: 72  73 73  Resp: (!) 27 (!) 32 14 (!) 24  Temp:  (!) 95.2 F (35.1 C)    TempSrc:  Rectal    SpO2: 91%  92% 94%  Weight:      Height:        Intake/Output Summary (Last 24 hours) at 01/13/2024 1541 Last data filed at 01/13/2024 1516 Gross per 24 hour  Intake 2307.98 ml  Output 475 ml  Net 1832.98 ml   Filed Weights   01/10/24 0926 01/10/24 1854  Weight: 97 kg 84.6 kg    Physical Exam Gen:- Awake Alert,  in no apparent distress  HEENT:- [place].AT, No sclera icterus Neck-Supple Neck,No JVD,.  Lungs-  CTAB , fair symmetrical air movement CV- S1, S2 normal,  regular , 2/6 SM Abd-  +ve B.Sounds, Abd Soft, No tenderness,    Extremity/Skin:- No  edema, pedal pulses present  Psych-affect is appropriate, oriented x3 Neuro-generalized weakness, no new focal deficits, no tremors  Data Reviewed: I have personally reviewed following labs and imaging studies  CBC: Recent Labs  Lab 01/10/24 0936 01/11/24 0200 01/12/24 0906 01/13/24 1252  WBC 9.9 8.2 10.0 11.3*  NEUTROABS 5.6  --   --  7.2  HGB 13.2 11.6* 11.2* 10.8*  HCT 41.7 35.5* 34.6* 32.9*  MCV 92.3 91.5 91.5 89.6  PLT 117* 100* 46* 48*   Basic Metabolic Panel: Recent Labs  Lab 01/10/24 2111 01/11/24 0200 01/11/24 0532 01/12/24 0414 01/13/24 0416  NA 129* 129* 132* 131* 138  K 3.7 3.5 4.0 3.0* 3.7  CL 108 108 111 113* 118*  CO2 9* 7* 9* 10* 11*  GLUCOSE 122* 135* 119* 106* 148*  BUN 56* 56* 58* 58* 54*  CREATININE 3.96* 4.10* 3.98* 3.41* 2.83*  CALCIUM 7.5* 7.3* 7.9* 7.3* 7.8*  MG  --   --   --  1.3*  --  GFR: Estimated Creatinine Clearance: 16 mL/min (A) (by C-G formula based on SCr of 2.83 mg/dL (H)). Liver Function Tests: Recent Labs  Lab 01/10/24 0936  AST 14*  ALT 10  ALKPHOS 100  BILITOT 0.6  PROT 5.9*  ALBUMIN 2.9*   Recent Results (from the past 240 hours)  Culture, blood (Routine x 2)     Status: None (Preliminary result)   Collection Time: 01/10/24  9:36 AM   Specimen: BLOOD  Result Value Ref Range Status   Specimen Description BLOOD LEFT ASSIST CONTROL  Final   Special Requests   Final    BOTTLES DRAWN AEROBIC AND ANAEROBIC Blood Culture results may not be optimal due to an inadequate volume of blood received in culture bottles   Culture   Final    NO GROWTH 3 DAYS Performed at [facility], [address], [place], [place] [address]    Report Status PENDING  Incomplete  Urine Culture     Status: None   Collection Time: 01/10/24  9:36 AM   Specimen: Urine, Random  Result Value Ref Range Status   Specimen Description   Final    URINE,  RANDOM Performed at [facility], [address], [place], [place] [address]    Special Requests   Final    NONE Reflexed from [id number] Performed at [facility], [address] [place], [place] [address]    Culture   Final    NO GROWTH Performed at [facility], [address], [place], [place] [address]    Report Status 01/11/2024 FINAL  Final  Culture, blood (Routine x 2)     Status: None (Preliminary result)   Collection Time: 01/10/24  9:41 AM   Specimen: BLOOD  Result Value Ref Range Status   Specimen Description BLOOD RIGHT ASSIST CONTROL  Final   Special Requests   Final    BOTTLES DRAWN AEROBIC AND ANAEROBIC Blood Culture adequate volume   Culture   Final    NO GROWTH 3 DAYS Performed at [facility], [address], [place], [place] [address]    Report Status PENDING  Incomplete  Gastrointestinal Panel by PCR , Stool     Status: None   Collection Time: 01/10/24 10:00 AM   Specimen: Stool  Result Value Ref Range Status   Campylobacter species NOT DETECTED NOT DETECTED Final   Plesimonas shigelloides NOT DETECTED NOT DETECTED Final   Salmonella species NOT DETECTED NOT DETECTED Final   Yersinia enterocolitica NOT DETECTED NOT DETECTED Final   Vibrio species NOT DETECTED NOT DETECTED Final   Vibrio cholerae NOT DETECTED NOT DETECTED Final   Enteroaggregative E coli (EAEC) NOT DETECTED NOT DETECTED Final   Enteropathogenic E coli (EPEC) NOT DETECTED NOT DETECTED Final   Enterotoxigenic E coli (ETEC) NOT DETECTED NOT DETECTED Final   Shiga like toxin producing E coli (STEC) NOT DETECTED NOT DETECTED Final   Shigella/Enteroinvasive E coli (EIEC) NOT DETECTED NOT DETECTED Final   Cryptosporidium NOT DETECTED NOT DETECTED Final   Cyclospora cayetanensis NOT DETECTED NOT DETECTED Final   Entamoeba histolytica NOT DETECTED NOT DETECTED Final   Giardia lamblia NOT DETECTED NOT DETECTED Final   Adenovirus F40/41 NOT DETECTED NOT DETECTED Final   Astrovirus NOT DETECTED NOT  DETECTED Final   Norovirus GI/GII NOT DETECTED NOT DETECTED Final   Rotavirus A NOT DETECTED NOT DETECTED Final   Sapovirus (I, II, IV, and V) NOT DETECTED NOT DETECTED Final    Comment: Performed at [facility],  [address] [place], [place] [address]  C Difficile Quick Screen w PCR reflex     Status: None   Collection Time: 01/10/24 10:00 AM   Specimen: Stool  Result Value Ref Range Status   C Diff antigen NEGATIVE NEGATIVE Final   C Diff toxin NEGATIVE NEGATIVE Final   C Diff interpretation No C. difficile detected.  Final    Comment: Performed at [facility], [address] [place], [place] [address]  MRSA Next Gen by PCR, Nasal     Status: None   Collection Time: 01/10/24  6:23 PM   Specimen: Nasal Mucosa; Nasal Swab  Result Value Ref Range Status   MRSA by PCR Next Gen NOT DETECTED NOT DETECTED Final    Comment: (NOTE) The GeneXpert MRSA Assay (FDA approved for NASAL specimens only), is one component of a comprehensive MRSA colonization surveillance program. It is not intended to diagnose MRSA infection nor to guide or monitor treatment for MRSA infections. Test performance is not FDA approved in patients less than 8 years old. Performed at [facility], [address] [place], [place] [address]     Radiology Studies: DG Abd 2 Views Result Date: 01/13/2024 CLINICAL DATA:  42595 Abdominal distension 63875 EXAM: ABDOMEN - 2 VIEW COMPARISON:  01/10/2024 FINDINGS: The bowel gas pattern is nonobstructive. There is no evidence of free air. No radio-opaque calculi or other significant radiographic abnormality is seen. IMPRESSION: Negative. Electronically Signed   By: Duanne Guess D.O.   On: 01/13/2024 14:20   Scheduled Meds:  acidophilus  2 capsule Oral TID   Chlorhexidine Gluconate Cloth  6 each Topical Q0600   feeding supplement  237 mL Oral BID BM   lipase/protease/amylase  24,000 Units Oral TID AC   pantoprazole (PROTONIX) IV  40 mg Intravenous Q12H    pravastatin  20 mg Oral QPM   sodium bicarbonate  1,300 mg Oral BID   Continuous Infusions:  sodium chloride 83 mL/hr at 01/13/24 1516    LOS: 3 days   Shon Hale M.D on 01/13/2024 at 3:41 PM  Go to [email or web address] - for contact info  [facility] - Office  [phone]  If 7PM-7AM, please contact night-coverage [email or web address] 01/13/2024, 3:41 PM

## 2024-01-13 NOTE — Patient Outreach (Signed)
 Care Management  Transitions of Care Program Transitions of Care Post-discharge week 4 PROGRAM CLOSURE - Due to Readmission    01/13/2024 Name: Douglas [name] MRN: [id number] DOB: [birth year]  Subjective: Douglas [name] is a 89 y.o. year old male who is a primary care patient of Carylon Perches, MD.    Assessment:     Per record, Patient re-admitted 01/10/24-unable to meet goals. Current TOC program closed.        Goals Addressed             This Visit's Progress    TOC Care Plan       Current Barriers: Patient re-admitted 01/10/24 Medication management Coumadin  - 01/08/24 Patient seen at coumadin clinic 01/06/24 INR was 6.8 - Patient is currently holding Coumadin as directed and understands how to restart - next appointment 3/26 Provider appointments PCP Dr Ouida Sills office called 01/08/24 by [facility] RN related to patient reporting worsening diarrhea not being helped by new Lomotil - 01/08/24 4:40pm follow up call to patient who states the diarrhea has slowed, he ate lunch of chicken soup, crackers, and applesauce and is now feeling better - will follow up with patient again 01/09/24 to reassess - Call to patient 01/09/24 - patient states he has only moved his bowels twice today, had an egg and toast for breakfast, chicken broth and applesauce for lunch and states he heard from nurse at Dr Alonza Smoker office that stool test was negative and he states he thinks he is supposed to see Dr Ouida Sills Monday, 01/13/24 and the nurse said she will follow up with him tomorrow, 01/10/24 Home Health services [place] [phone] - 01/08/24 not reviewed due to concern with diarrhea  RNCM Clinical Goal(s): Patient re-admitted 01/10/24-unable to meet goals Patient will work with the Care Management team over the next 30 days to address Transition of Care Barriers: Medication Management 01/08/24 Ongoing 30 day weekly calls for [facility]  Support at home  Provider appointments Home Health services take all medications exactly as  prescribed and will call provider for medication related questions as evidenced by no missed medication doses - 01/08/24 Reviewed medications with patient - added Lomotil attend all scheduled medical appointments: with PCP Lab monitoring for Coumadin  as evidenced by no missed follow-up visits - Patient confirmed he went to 01/06/24 coumadin clinic and is currently holding coumadin as directed and understands how to restart on 01/10/24 demonstrate ongoing self health care management ability  as evidenced by decreased ER visits and hospital admissions  through collaboration with RN Care manager, provider, and care team.  01/08/24 Ongoing  Interventions:Patient re-admitted 01/10/24-unable to meet goals Evaluation of current treatment plan related to  self management and patient's adherence to plan as established by provider  Transitions of Care:  Goal not met:  Patient re-admitted 01/10/24-unable to meet goals Doctor Visits  - discussed the importance of doctor visits - 01/08/24: reviewed 01/06/24 coumadin clinic results - coumadin orders, 01/21/24 Infectious disease appointment with [name] MD and provided address to patient, confirmed 4/22 consult appointment related to nosebleeds and recent difficulty swallowing, which patient reports has resolved.  Contacted Health RN/OT/PT Rolene Arbour for [facility] date - 01/08/24 not reviewed today related addressing diarrhea concern Reviewed daily weights and patient reported the day he was discharged he weighed 208lbs and this morning weighed 199.9lbs - reports swelling in legs is improving and he elevates legs when sitting - 01/08/24 did not review today related focus on diarrhea Educated patient on bland dietary options  for diarrhea after patient reported eating sausage and egg and having coffee for breakfast - provided options including bananas, rice, applesauce, toast, yogurt, plain pasta, chicken broth 01/08/24   AFIB Interventions: (Status:  Goal Not Met.) Short Term Goal   Patient re-admitted 01/10/24-unable to meet goals   Reviewed importance of adherence to anticoagulant exactly as prescribed 01/08/24 patient was seen at coumadin clinic 01/06/24 and reports currently holding Coumadin as directed related to INR 6.8 - recheck 01/15/24 *Counseled on bleeding risk associated with Coumadin and importance of self-monitoring for signs/symptoms of bleeding - 01/08/24 Reinforced bleeding risk related to INR 6.8 -  *Counseled on seeking medical attention after a head injury or if there is blood in the urine/stool 12/31/23 reinforced    Patient Goals/Self-Care Activities: Patient re-admitted 01/10/24-unable to meet goals Participate in Transition of Care Program/Attend [facility] scheduled calls Take all medications as prescribed Attend all scheduled provider appointments Call pharmacy for medication refills 3-7 days in advance of running out of medications Perform all self care activities independently  Perform IADL's (shopping, preparing meals, housekeeping, managing finances) independently Call provider office for new concerns or questions - 01/08/24 patient and [facility] RN made conference call to Dr Alonza Smoker office related to diarrhea (01/09/24 follow up with patient who states nurse from Dr Alonza Smoker office called and stool test was negative, states she said she will call him back tomorrow, 01/10/24 - reported only 2 BM's today 01/09/24)  Follow Up Plan: Patient re-admitted 01/10/24-unable to meet goals The patient has been provided with contact information for the care management team and has been advised to call with any health related questions or concerns.          Hilbert Odor RN, CCM [place]  VBCI-Population Health RN Care Manager [phone]

## 2024-01-13 NOTE — Progress Notes (Signed)
 Patient's temperature dropped to 95. Patient placed back on heating blanket, reading 94.8.

## 2024-01-13 NOTE — Care Management Important Message (Signed)
 Important Message  Patient Details  Name: Douglas [name] MRN: [id number] Date of Birth: [birth year]   Important Message Given:  N/A - LOS <3 / Initial given by admissions     Corey Harold 01/13/2024, 11:49 AM

## 2024-01-13 NOTE — Plan of Care (Signed)
  Problem: Education: Goal: Knowledge of General Education information will improve Description: Including pain rating scale, medication(s)/side effects and non-pharmacologic comfort measures Outcome: Progressing   Problem: Health Behavior/Discharge Planning: Goal: Ability to manage health-related needs will improve Outcome: Progressing   Problem: Clinical Measurements: Goal: Ability to maintain clinical measurements within normal limits will improve Outcome: Not Progressing Goal: Will remain free from infection Outcome: Progressing Goal: Diagnostic test results will improve Outcome: Progressing Goal: Respiratory complications will improve Outcome: Progressing Goal: Cardiovascular complication will be avoided Outcome: Progressing   Problem: Activity: Goal: Risk for activity intolerance will decrease Outcome: Progressing   Problem: Nutrition: Goal: Adequate nutrition will be maintained Outcome: Not Progressing   Problem: Coping: Goal: Level of anxiety will decrease Outcome: Progressing   Problem: Elimination: Goal: Will not experience complications related to bowel motility Outcome: Progressing Goal: Will not experience complications related to urinary retention Outcome: Progressing   Problem: Pain Managment: Goal: General experience of comfort will improve and/or be controlled Outcome: Progressing   Problem: Safety: Goal: Ability to remain free from injury will improve Outcome: Progressing   Problem: Skin Integrity: Goal: Risk for impaired skin integrity will decrease Outcome: Not Progressing

## 2024-01-14 DIAGNOSIS — R1312 Dysphagia, oropharyngeal phase: Secondary | ICD-10-CM | POA: Diagnosis not present

## 2024-01-14 DIAGNOSIS — I480 Paroxysmal atrial fibrillation: Secondary | ICD-10-CM | POA: Diagnosis not present

## 2024-01-14 DIAGNOSIS — K224 Dyskinesia of esophagus: Secondary | ICD-10-CM

## 2024-01-14 DIAGNOSIS — K869 Disease of pancreas, unspecified: Secondary | ICD-10-CM

## 2024-01-14 DIAGNOSIS — N179 Acute kidney failure, unspecified: Secondary | ICD-10-CM | POA: Diagnosis not present

## 2024-01-14 DIAGNOSIS — R791 Abnormal coagulation profile: Secondary | ICD-10-CM | POA: Diagnosis not present

## 2024-01-14 DIAGNOSIS — R197 Diarrhea, unspecified: Secondary | ICD-10-CM | POA: Diagnosis not present

## 2024-01-14 DIAGNOSIS — E871 Hypo-osmolality and hyponatremia: Secondary | ICD-10-CM | POA: Diagnosis not present

## 2024-01-14 LAB — RENAL FUNCTION PANEL
Albumin: 2.1 g/dL — ABNORMAL LOW (ref 3.5–5.0)
Anion gap: 4 — ABNORMAL LOW (ref 5–15)
BUN: 53 mg/dL — ABNORMAL HIGH (ref 8–23)
CO2: 14 mmol/L — ABNORMAL LOW (ref 22–32)
Calcium: 7.6 mg/dL — ABNORMAL LOW (ref 8.9–10.3)
Chloride: 120 mmol/L — ABNORMAL HIGH (ref 98–111)
Creatinine, Ser: 2.25 mg/dL — ABNORMAL HIGH (ref 0.61–1.24)
GFR, Estimated: 26 mL/min — ABNORMAL LOW (ref >60)
Glucose, Bld: 130 mg/dL — ABNORMAL HIGH (ref 70–99)
Phosphorus: 1.5 mg/dL — ABNORMAL LOW (ref 2.5–4.6)
Potassium: 4.5 mmol/L (ref 3.5–5.1)
Sodium: 138 mmol/L (ref 135–145)

## 2024-01-14 LAB — PROTIME-INR
INR: 3.8 — ABNORMAL HIGH (ref 0.8–1.2)
Prothrombin Time: 37.3 s — ABNORMAL HIGH (ref 11.4–15.2)

## 2024-01-14 LAB — CBC
HCT: 31 % — ABNORMAL LOW (ref 39.0–52.0)
Hemoglobin: 10.3 g/dL — ABNORMAL LOW (ref 13.0–17.0)
MCH: 29.7 pg (ref 26.0–34.0)
MCHC: 33.2 g/dL (ref 30.0–36.0)
MCV: 89.3 fL (ref 80.0–100.0)
Platelets: 43 10*3/uL — ABNORMAL LOW (ref 150–400)
RBC: 3.47 MIL/uL — ABNORMAL LOW (ref 4.22–5.81)
RDW: 15.5 % (ref 11.5–15.5)
WBC: 9.8 10*3/uL (ref 4.0–10.5)
nRBC: 0 % (ref 0.0–0.2)

## 2024-01-14 LAB — CORTISOL-AM, BLOOD: Cortisol - AM: 11.3 ug/dL (ref 6.7–22.6)

## 2024-01-14 MED ORDER — SODIUM PHOSPHATES 45 MMOLE/15ML IV SOLN
30.0000 mmol | Freq: Once | INTRAVENOUS | Status: AC
  Administered 2024-01-14: 30 mmol via INTRAVENOUS
  Filled 2024-01-14: qty 10

## 2024-01-14 MED ORDER — BOOST / RESOURCE BREEZE PO LIQD CUSTOM
1.0000 | Freq: Three times a day (TID) | ORAL | Status: DC
  Administered 2024-01-14 – 2024-01-16 (×9): 1 via ORAL

## 2024-01-14 NOTE — Progress Notes (Signed)
 PROGRESS NOTE  Douglas [name], is a 89 y.o. male, DOB - 03/03/1928, WUJ:[id number]  Admit date - 01/10/2024   Admitting Physician No admitting provider for patient encounter.  Outpatient Primary MD for the patient is Carylon Perches, MD  LOS - 4  Chief Complaint  Patient presents with   Diarrhea   Weakness      Brief Narrative:   89 y.o. male with medical history significant of Afib on warfarin, s/p AVR (prior history of aortic stenosis and prior history of aortic valve endocarditis), CAD, HTN, h/o AVM, h/o esophageal ulcer, h/o CHB with pacemaker in place, GERD admitted on 01/10/2023 with severe sepsis with septic shock requiring Neo-Synephrine for pressure support   -Assessment and Plan: 1)Hypovolemic shock shock and hemodynamic instability requiring Neo-Synephrine for pressor support---suspect aspiration pneumonia related -- Weaned off Neo-Synephrine as of 01/11/2024 -BP improved with IV fluids -History of Enterococcus faecalis bacteremia from 12/19/2023 -Blood and urine cultures from 01/10/2024 NGTD -Okay to stop IV Rocephin after 01/11/2024 dose-patient in view of persistent diarrhea after recent antibiotic therapy --Stopped Flagyl on 01/11/2024 --C. difficile negative--stool for C. difficile negative --okay to use Imodium prn -Urine culture negative -stopped Flagyl -CT abdomen and pelvis without colitis or diverticulitis, no acute abdominopelvic findings on CT -In retrospect patient probably did Not have frank sepsis--- hypotension was most likely related to volume depletion in the setting of profuse diarrhea for over 2 weeks with hypovolemia/hypotension and metabolic acidosis -- 2)Hypothermia--patient developing hypothermia again -BP stable -Hypothermia resolved -Blood cultures from 01/13/2024 NGTD -A.m. cortisol level WNL  3)Hyponatremia--- due to GI losses/dehydration -Sodium on admission was 128 -Sodium normalized with hydration -A.m. cortisol level WNL  4)AKI----acute kidney  injury with metabolic acidosis in the setting of GI losses creatinine on admission=4.33 (peak on 01/10/24) -- baseline creatinine =  1.04 (on 12/24/2023) -- creatinine is improving with hydration and improvement in BP (creatinine is currently down to 2.25 (from peak of 4.33) -c/n  bicarb replacements (bicarb is up to 14 from 7) -Urine output is Not great --renally adjust medications, avoid nephrotoxic agents / dehydration  / hypotension   5) chronic thrombocytopenia--platelets are lower than baseline -No bleeding concerns, - continue to monitor  6)PAFib/history of prior aortic valve replacement--supratherapeutic INR noted -Patient with vitamin K -Continue to hold Coumadin as INR remains therapeutic -Okay to start IV heparin bridge when INR gets close to 2  7)Hypokalemia/Hypomagnesemia--replace and recheck  8)Diarrhea--persistent diarrhea for about 3 weeks now--leading to volume depletion with dehydration and hypotension as well as electrolyte derangement -Stool culture/GI pathogen and stool for C. difficile negative -Continue as needed Imodium -GI consult appreciated -Continue probiotic  9)Chronic anemia--- hemoglobin currently above  10 which is close to prior baseline -No obvious bleeding concerns -Continue to monitor  10)Social/Ethics--plan of care and advanced directive discussed with patient to find members  -patient and family request DNR/DNI status  Status is: Inpatient   Disposition: The patient is from: Home              Anticipated d/c is to: Home              Anticipated d/c date is: > 3 days              Patient currently is not medically stable to d/c. Barriers: Not Clinically Stable-   Code Status :  -  Code Status: Do not attempt resuscitation (DNR) PRE-ARREST INTERVENTIONS DESIRED   Family Communication:   Was discussed with son Brett Canales, daughter-in-law Elease Hashimoto, and -daughter  Clydie Braun  DVT Prophylaxis  :   - SCDs  Place and maintain sequential compression device  Start: 01/13/24 1307 Place TED hose Start: 01/13/24 1307 SCDs Start: 01/10/24 1851   Lab Results  Component Value Date   PLT 43 (L) 01/14/2024   Inpatient Medications  Scheduled Meds:  acidophilus  2 capsule Oral TID   Chlorhexidine Gluconate Cloth  6 each Topical Q0600   feeding supplement  237 mL Oral BID BM   lipase/protease/amylase  24,000 Units Oral TID AC   pantoprazole (PROTONIX) IV  40 mg Intravenous Q12H   pravastatin  20 mg Oral QPM   sodium bicarbonate  1,300 mg Oral BID   Continuous Infusions:  sodium PHOSPHATE IVPB (in mmol)     PRN Meds:.acetaminophen, diclofenac Sodium, loperamide, ondansetron **OR** ondansetron (ZOFRAN) IV, mouth rinse, oxyCODONE   Anti-infectives (From admission, onward)    Start     Dose/Rate Route Frequency Ordered Stop   01/10/24 1815  cefTRIAXone (ROCEPHIN) 2 g in sodium chloride 0.9 % 100 mL IVPB  Status:  Discontinued        2 g 200 mL/hr over 30 Minutes Intravenous Every 24 hours 01/10/24 1802 01/12/24 1046   01/10/24 1815  metroNIDAZOLE (FLAGYL) IVPB 500 mg  Status:  Discontinued        500 mg 100 mL/hr over 60 Minutes Intravenous Every 12 hours 01/10/24 1802 01/11/24 1916       Subjective: Douglas [name] today has no emesis,  No chest pain,   ---No further Hypothermia -Volume and frequency of stools improving -Oral intake is fair but not great -Urine output is not great but improving  Objective: Vitals:   01/14/24 0600 01/14/24 0700 01/14/24 0722 01/14/24 0800  BP: (!) 100/33 (!) 126/33  (!) 108/29  Pulse: 70 70 71 70  Resp: 15 19 14 16   Temp:   97.7 F (36.5 C)   TempSrc:   Oral   SpO2: 97% 100% 100% 96%  Weight:      Height:        Intake/Output Summary (Last 24 hours) at 01/14/2024 1004 Last data filed at 01/14/2024 0843 Gross per 24 hour  Intake 1814.75 ml  Output 675 ml  Net 1139.75 ml   Filed Weights   01/10/24 0926 01/10/24 1854  Weight: 97 kg 84.6 kg   Physical Exam Gen:- Awake Alert,  in no apparent  distress  HEENT:- [place].AT, No sclera icterus Neck-Supple Neck,No JVD,.  Lungs-  CTAB , fair symmetrical air movement CV- S1, S2 normal, regular , 2/6 SM Abd-  +ve B.Sounds, Abd Soft, No tenderness,    Extremity/Skin:- No  edema, pedal pulses present  Psych-affect is appropriate, oriented x3 Neuro-generalized weakness, no new focal deficits, no tremors  Data Reviewed: I have personally reviewed following labs and imaging studies  CBC: Recent Labs  Lab 01/10/24 0936 01/11/24 0200 01/12/24 0906 01/13/24 1252 01/14/24 0413  WBC 9.9 8.2 10.0 11.3* 9.8  NEUTROABS 5.6  --   --  7.2  --   HGB 13.2 11.6* 11.2* 10.8* 10.3*  HCT 41.7 35.5* 34.6* 32.9* 31.0*  MCV 92.3 91.5 91.5 89.6 89.3  PLT 117* 100* 46* 48* 43*   Basic Metabolic Panel: Recent Labs  Lab 01/11/24 0200 01/11/24 0532 01/12/24 0414 01/13/24 0416 01/14/24 0413  NA 129* 132* 131* 138 138  K 3.5 4.0 3.0* 3.7 4.5  CL 108 111 113* 118* 120*  CO2 7* 9* 10* 11* 14*  GLUCOSE 135* 119* 106* 148* 130*  BUN 56* 58* 58* 54* 53*  CREATININE 4.10* 3.98* 3.41* 2.83* 2.25*  CALCIUM 7.3* 7.9* 7.3* 7.8* 7.6*  MG  --   --  1.3*  --   --   PHOS  --   --   --   --  1.5*   GFR: Estimated Creatinine Clearance: 20.1 mL/min (A) (by C-G formula based on SCr of 2.25 mg/dL (H)). Liver Function Tests: Recent Labs  Lab 01/10/24 0936 01/14/24 0413  AST 14*  --   ALT 10  --   ALKPHOS 100  --   BILITOT 0.6  --   PROT 5.9*  --   ALBUMIN 2.9* 2.1*   Recent Results (from the past 240 hours)  Culture, blood (Routine x 2)     Status: None (Preliminary result)   Collection Time: 01/10/24  9:36 AM   Specimen: BLOOD  Result Value Ref Range Status   Specimen Description BLOOD LEFT ASSIST CONTROL  Final   Special Requests   Final    BOTTLES DRAWN AEROBIC AND ANAEROBIC Blood Culture results may not be optimal due to an inadequate volume of blood received in culture bottles   Culture   Final    NO GROWTH 4 DAYS Performed at [facility], [address], [place], [place] [address]    Report Status PENDING  Incomplete  Urine Culture     Status: None   Collection Time: 01/10/24  9:36 AM   Specimen: Urine, Random  Result Value Ref Range Status   Specimen Description   Final    URINE, RANDOM Performed at [facility], [address] [place], [place] [address]    Special Requests   Final    NONE Reflexed from [id number] Performed at [facility], [address] [place], [place] [address]    Culture   Final    NO GROWTH Performed at [facility], [address], [place], [place] [address]    Report Status 01/11/2024 FINAL  Final  Culture, blood (Routine x 2)     Status: None (Preliminary result)   Collection Time: 01/10/24  9:41 AM   Specimen: BLOOD  Result Value Ref Range Status   Specimen Description BLOOD RIGHT ASSIST CONTROL  Final   Special Requests   Final    BOTTLES DRAWN AEROBIC AND ANAEROBIC Blood Culture adequate volume   Culture   Final    NO GROWTH 4 DAYS Performed at [facility], [address] [place], [place] [address]    Report Status PENDING  Incomplete  Gastrointestinal Panel by PCR , Stool     Status: None   Collection Time: 01/10/24 10:00 AM   Specimen: Stool  Result Value Ref Range Status   Campylobacter species NOT DETECTED NOT DETECTED Final   Plesimonas shigelloides NOT DETECTED NOT DETECTED Final   Salmonella species NOT DETECTED NOT DETECTED Final   Yersinia enterocolitica NOT DETECTED NOT DETECTED Final   Vibrio species NOT DETECTED NOT DETECTED Final   Vibrio cholerae NOT DETECTED NOT DETECTED Final   Enteroaggregative E coli (EAEC) NOT DETECTED NOT DETECTED Final   Enteropathogenic E coli (EPEC) NOT DETECTED NOT DETECTED Final   Enterotoxigenic E coli (ETEC) NOT DETECTED NOT DETECTED Final   Shiga like toxin producing E coli (STEC) NOT DETECTED NOT DETECTED Final   Shigella/Enteroinvasive E coli (EIEC) NOT DETECTED NOT DETECTED Final   Cryptosporidium NOT DETECTED NOT DETECTED  Final   Cyclospora cayetanensis NOT DETECTED NOT DETECTED Final   Entamoeba histolytica NOT  DETECTED NOT DETECTED Final   Giardia lamblia NOT DETECTED NOT DETECTED Final   Adenovirus F40/41 NOT DETECTED NOT DETECTED Final   Astrovirus NOT DETECTED NOT DETECTED Final   Norovirus GI/GII NOT DETECTED NOT DETECTED Final   Rotavirus A NOT DETECTED NOT DETECTED Final   Sapovirus (I, II, IV, and V) NOT DETECTED NOT DETECTED Final    Comment: Performed at [facility], [address] [place], [place] [address]  C Difficile Quick Screen w PCR reflex     Status: None   Collection Time: 01/10/24 10:00 AM   Specimen: Stool  Result Value Ref Range Status   C Diff antigen NEGATIVE NEGATIVE Final   C Diff toxin NEGATIVE NEGATIVE Final   C Diff interpretation No C. difficile detected.  Final    Comment: Performed at [facility], [address] [place], [place] [address]  MRSA Next Gen by PCR, Nasal     Status: None   Collection Time: 01/10/24  6:23 PM   Specimen: Nasal Mucosa; Nasal Swab  Result Value Ref Range Status   MRSA by PCR Next Gen NOT DETECTED NOT DETECTED Final    Comment: (NOTE) The GeneXpert MRSA Assay (FDA approved for NASAL specimens only), is one component of a comprehensive MRSA colonization surveillance program. It is not intended to diagnose MRSA infection nor to guide or monitor treatment for MRSA infections. Test performance is not FDA approved in patients less than 39 years old. Performed at [facility], [address] [place], [place] [address]   Culture, blood (Routine X 2) w Reflex to ID Panel     Status: None (Preliminary result)   Collection Time: 01/13/24  4:34 PM   Specimen: BLOOD LEFT HAND  Result Value Ref Range Status   Specimen Description   Final    BLOOD LEFT HAND BOTTLES DRAWN AEROBIC AND ANAEROBIC   Special Requests Blood Culture adequate volume  Final   Culture   Final    NO GROWTH < 24 HOURS Performed at [facility], [address],  [place], [place] [address]    Report Status PENDING  Incomplete  Culture, blood (Routine X 2) w Reflex to ID Panel     Status: None (Preliminary result)   Collection Time: 01/13/24  4:34 PM   Specimen: BLOOD LEFT HAND  Result Value Ref Range Status   Specimen Description BLOOD LEFT HAND AEROBIC BOTTLE ONLY  Final   Special Requests   Final    Blood Culture results may not be optimal due to an inadequate volume of blood received in culture bottles   Culture   Final    NO GROWTH < 24 HOURS Performed at [facility], [address], [place], [place] [address]    Report Status PENDING  Incomplete    Radiology Studies: DG Abd 2 Views Result Date: 01/13/2024 CLINICAL DATA:  57846 Abdominal distension 96295 EXAM: ABDOMEN - 2 VIEW COMPARISON:  01/10/2024 FINDINGS: The bowel gas pattern is nonobstructive. There is no evidence of free air. No radio-opaque calculi or other significant radiographic abnormality is seen. IMPRESSION: Negative. Electronically Signed   By: Duanne Guess D.O.   On: 01/13/2024 14:20   Scheduled Meds:  acidophilus  2 capsule Oral TID   Chlorhexidine Gluconate Cloth  6 each Topical Q0600   feeding supplement  237 mL Oral BID BM   lipase/protease/amylase  24,000 Units Oral TID AC   pantoprazole (PROTONIX) IV  40 mg Intravenous Q12H   pravastatin  20 mg Oral  QPM   sodium bicarbonate  1,300 mg Oral BID   Continuous Infusions:  sodium PHOSPHATE IVPB (in mmol)      LOS: 4 days   Shon Hale M.D on 01/14/2024 at 10:04 AM  Go to [email or web address] - for contact info  [facility] - Office  [phone]  If 7PM-7AM, please contact night-coverage [email or web address] 01/14/2024, 10:04 AM

## 2024-01-14 NOTE — Evaluation (Signed)
 Clinical/Bedside Swallow Evaluation Patient Details  Name: Douglas [name] MRN: [id number] Date of Birth: 08-27-1928  Today's Date: 01/14/2024 Time: SLP Start Time (ACUTE ONLY): 1228 SLP Stop Time (ACUTE ONLY): 1300 SLP Time Calculation (min) (ACUTE ONLY): 32 min  Past Medical History:  Past Medical History:  Diagnosis Date   Acute upper GI bleed 05/18/2016   Aneurysm of thoracic aorta (HCC)    Ascending. 5.7 x 5.5cm - Bentall procedure   Aortic stenosis    Bioprosthetic AVR   Arthritis    Atrial fibrillation (HCC)    Documented on pacer interrogation 6/13   Complete heart block (HCC)    Status post pacemaker placement   Coronary atherosclerosis of native coronary artery    Multivessel - LIMA to LAD, SVG to OM, SVG to PDA   DJD (degenerative joint disease)    Esophageal dysmotility    Essential hypertension    Fibromyalgia    Gout    H/O arteriovenous malformation (AVM)    H/O hiatal hernia    Hemorrhoid    History of esophageal ulcer    History of skin cancer    Hx of adenomatous colonic polyps    Iron deficiency anemia due to chronic blood loss    Melena    Pacemaker-Medtronic    PMR (polymyalgia rheumatica) (HCC)    SIRS (systemic inflammatory response syndrome) (HCC) 01/16/2015   Past Surgical History:  Past Surgical History:  Procedure Laterality Date   AORTIC VALVE REPLACEMENT  01/24/12   APPENDECTOMY     BACK SURGERY     1965 or 1966 lumbar back surgery   BENTALL PROCEDURE  01/24/2012   Procedure: BENTALL PROCEDURE;  Surgeon: Kerin Perna, MD;  Location: [facility] OR;  Service: Open Heart Surgery;  Laterality: N/A;   BILATERAL KNEE ARTHROSCOPY     CATARACT EXTRACTION W/PHACO Left 12/03/2016   Procedure: CATARACT EXTRACTION PHACO AND INTRAOCULAR LENS PLACEMENT LEFT EYE;  Surgeon: Gemma Payor, MD;  Location: [facility];  Service: Ophthalmology;  Laterality: Left;  CDE:  10.87   CATARACT EXTRACTION W/PHACO Right 12/13/2016   Procedure: CATARACT EXTRACTION PHACO AND INTRAOCULAR  LENS PLACEMENT (IOC);  Surgeon: Gemma Payor, MD;  Location: [facility];  Service: Ophthalmology;  Laterality: Right;  right cde 11.78   CORONARY ARTERY BYPASS GRAFT  01/24/2012   Procedure: CORONARY ARTERY BYPASS GRAFTING (CABG);  Surgeon: Kerin Perna, MD;  Location: [facility] OR;  Service: Open Heart Surgery;  Laterality: N/A;   ESOPHAGOGASTRODUODENOSCOPY  06/17/2012   Procedure: ESOPHAGOGASTRODUODENOSCOPY (EGD);  Surgeon: Malissa Hippo, MD;  Location: [facility];  Service: Endoscopy;  Laterality: N/A;  ED   ESOPHAGOGASTRODUODENOSCOPY N/A 05/19/2016   Procedure: ESOPHAGOGASTRODUODENOSCOPY (EGD);  Surgeon: West Bali, MD;  Location: [facility];  Service: Endoscopy;  Laterality: N/A;   HEMORROIDECTOMY     HERNIA REPAIR     Left inguinal hernia repair     PACEMAKER PLACEMENT     Medtronic 4/13 - Dr. Johney Frame   PERMANENT PACEMAKER INSERTION N/A 02/01/2012   Procedure: PERMANENT PACEMAKER INSERTION;  Surgeon: Hillis Range, MD; Medtronic Adapta L model ADDRL 1 (serial number [id number])     TRANSESOPHAGEAL ECHOCARDIOGRAM (CATH LAB) N/A 12/23/2023   Procedure: TRANSESOPHAGEAL ECHOCARDIOGRAM;  Surgeon: Jodelle Red, MD;  Location: [facility] INVASIVE CV LAB;  Service: Cardiovascular;  Laterality: N/A;   HPI:  Douglas [name] is a 89 y.o. male with medical history significant of Afib on warfarin, s/p AVR, CAD, HTN, h/o AVM, h/o esophageal ulcer, h/o CHB  with pacemaker in place, GERD.  He was recently admitted to the hospital due to bacteremia and sepsis due to pneumonia.  He was treated with antibiotics and discharged to home on 3/4.  Since that day, he has had several episodes of watery nonbloody diarrhea.  This has been going on for about 3 weeks.  He did go see his primary care who prescribed him antidiarrheal agents, but this did not help.  He has been feeling cold, fatigued and was finally brought to the hospital by his family due to him not improving on 01/10/24.  MBS completed 01/01/23 revealing mild/mod  pharyngeal phase dysphagia characterized by tongue base weakness with reduced tongue base retraction and epiglottic deflection resulting in min vallecular residue across consistencies and textures and reduced opening of UES with resulting pyriform residue post initial swallow. A repeat/dry swallow was effective in reducing pharyngeal residue, as was implementation of effortful/hard swallow while swallowing puree. Pt with a single episode of trace penetration of thins via straw when taking sequential swallows in attempt to clear the barium tablet from the valleculae. SLP cued the Pt to clear his throat and it was removed. ST consulted for swallow evaluation.    Assessment / Plan / Recommendation  Clinical Impression  Pt seen for clinical swallow evaluation with reported hx of esophageal dysphagia/chart review confirmed.  Daughter in attendance during evaluation and confirmed past medical hx.  Pt stated "food/liquid gets stuck  and sometimes comes back up."  Multiple swallows and reported globus sensation during consumption of larger sips of thin liquids, puree and soft solids without use of effortful swallow/repetitive swallows during po consumption.  With use of effortful swallow, repetitive swallows, liquid wash and small bites/sips, this improved pharyngeal clearance. Discussed esophageal precautions/swallowing strategies related to reported esophageal symptoms and hand-out left with pt/daughter reiterating precautions.  Medications given by nursing staff whole in puree during evaluation and pt appeared to consume without overt s/s of aspiration present.  Recommend per pt request/deconditioning/hx of esophageal dysphagia for pt to remain on Dysphagia 2(minced)/thin liquid diet with use of esophageal precautions during all po intake. ST will f/u for education/meal tolerance during acute stay.  Thank you for this consult. SLP Visit Diagnosis: Dysphagia, pharyngoesophageal phase (R13.14)    Aspiration Risk   Mild aspiration risk;Moderate aspiration risk    Diet Recommendation   Thin;Dysphagia 2 (chopped)  Medication Administration: Whole meds with puree    Other  Recommendations Oral Care Recommendations: Oral care BID    Recommendations for follow up therapy are one component of a multi-disciplinary discharge planning process, led by the attending physician.  Recommendations may be updated based on patient status, additional functional criteria and insurance authorization.  Follow up Recommendations Follow physician's recommendations for discharge plan and follow up therapies      Assistance Recommended at Discharge  TBD  Functional Status Assessment Patient has had a recent decline in their functional status and demonstrates the ability to make significant improvements in function in a reasonable and predictable amount of time.  Frequency and Duration min 2x/week  1 week       Prognosis Prognosis for improved oropharyngeal function: Fair Barriers to Reach Goals: Severity of deficits      Swallow Study   General Date of Onset: 01/10/24 HPI: Douglas [name] is a 89 y.o. male with medical history significant of Afib on warfarin, s/p AVR, CAD, HTN, h/o AVM, h/o esophageal ulcer, h/o CHB with pacemaker in place, GERD.  He was recently admitted to  the hospital due to bacteremia and sepsis due to pneumonia.  He was treated with antibiotics and discharged to home on 3/4.  Since that day, he has had several episodes of watery nonbloody diarrhea.  This has been going on for about 3 weeks.  He did go see his primary care who prescribed him antidiarrheal agents, but this did not help.  He has been feeling cold, fatigued and was finally brought to the hospital by his family due to him not improving.  MBS completed 01/01/23 revealing mild/mod pharyngeal phase dysphagia characterized by tongue base weakness with reduced tongue base retraction and epiglottic deflection resulting in min vallecular residue  across consistencies and textures and reduced opening of UES with resulting pyriform residue post initial swallow. A repeat/dry swallow was effective in reducing pharyngeal residue, as was implementation of effortful/hard swallow while swallowing puree. Pt with a single episode of trace penetration of thins via straw when taking sequential swallows in attempt to clear the barium tablet from the valleculae. SLP cued the Pt to clear his throat and it was removed. ST consulted for swallow evaluation. Type of Study: Bedside Swallow Evaluation Previous Swallow Assessment: MBS 01/01/23 with results above Diet Prior to this Study: Dysphagia 2 (finely chopped);Thin liquids (Level 0) Temperature Spikes Noted: No Respiratory Status: Room air History of Recent Intubation: No Behavior/Cognition: Alert;Cooperative Oral Cavity Assessment: Within Functional Limits Oral Care Completed by SLP: Recent completion by staff Oral Cavity - Dentition: Missing dentition Vision: Functional for self-feeding Self-Feeding Abilities: Able to feed self;Needs assist Patient Positioning: Upright in bed Baseline Vocal Quality: Normal Volitional Cough: Strong Volitional Swallow: Able to elicit    Oral/Motor/Sensory Function Overall Oral Motor/Sensory Function: Within functional limits   Ice Chips Ice chips: Not tested Other Comments: Pt declined   Thin Liquid Thin Liquid: Impaired Presentation: Cup;Self Fed;Straw Pharyngeal  Phase Impairments: Multiple swallows;Throat Clearing - Delayed    Nectar Thick Nectar Thick Liquid: Not tested   Honey Thick Honey Thick Liquid: Not tested   Puree Puree: Impaired Presentation: Self Fed Pharyngeal Phase Impairments: Multiple swallows;Other (comments) (globus sensation)   Solid     Solid: Impaired Presentation: Self Fed Pharyngeal Phase Impairments: Multiple swallows (globus sensation intermittently with meals reported)      Douglas [name],M.S.,CCC-SLP 01/14/2024,3:13 PM

## 2024-01-14 NOTE — Progress Notes (Addendum)
 Subjective: Diarrhea:  5 documented Bms yesterday.  Received Imodium 4 mg x 2 yesterday. 1 dose this morning.  1 documented BM this morning. Spoke with nurse who reports stool was mushy and green.   Denies abdominal pain, nausea, or vomiting.    Dysphagia:  Chronic. Sensation of foods and liquids sitting in his throat. Drinking ensure and ate some pudding and applesauce this morning. Ensure sits in his throat and some comes back up at times. Denies any sensation of foods or liquids sitting in his chest. Didn't eat oatmeal this morning stating it sits in his throat.   Ginger ale  is burning when it goes down, but no pain or burning in his esophagus otherwise.  Denies heartburn.   Objective: Vital signs in last 24 hours: Temp:  [94.8 F (34.9 C)-98 F (36.7 C)] 97.7 F (36.5 C) (03/25 0722) Pulse Rate:  [69-77] 70 (03/25 0800) Resp:  [11-32] 16 (03/25 0800) BP: (87-128)/(24-76) 108/29 (03/25 0800) SpO2:  [86 %-100 %] 96 % (03/25 0800) Last BM Date : 01/14/24 General:   Alert and oriented, frail, chronically ill appearing.  Head:  Normocephalic and atraumatic. Eyes:  No icterus, sclera clear. Conjuctiva pink.  Abdomen:  Bowel sounds present, soft, non-tender, non-distended. No rebound or guarding.  Psych:   Normal mood and affect.  Intake/Output from previous day: 03/24 0701 - 03/25 0700 In: 3116.3 [I.V.:3116.3] Out: 675 [Urine:675] Intake/Output this shift: Total I/O In: 240 [P.O.:240] Out: -   Lab Results: Recent Labs    01/12/24 0906 01/13/24 1252 01/14/24 0413  WBC 10.0 11.3* 9.8  HGB 11.2* 10.8* 10.3*  HCT 34.6* 32.9* 31.0*  PLT 46* 48* 43*   BMET Recent Labs    01/12/24 0414 01/13/24 0416 01/14/24 0413  NA 131* 138 138  K 3.0* 3.7 4.5  CL 113* 118* 120*  CO2 10* 11* 14*  GLUCOSE 106* 148* 130*  BUN 58* 54* 53*  CREATININE 3.41* 2.83* 2.25*  CALCIUM 7.3* 7.8* 7.6*   LFT Recent Labs    01/14/24 0413  ALBUMIN 2.1*   PT/INR Recent Labs     01/13/24 0416 01/14/24 0413  LABPROT 28.0* 37.3*  INR 2.6* 3.8*    Studies/Results: DG Abd 2 Views Result Date: 01/13/2024 CLINICAL DATA:  09811 Abdominal distension 91478 EXAM: ABDOMEN - 2 VIEW COMPARISON:  01/10/2024 FINDINGS: The bowel gas pattern is nonobstructive. There is no evidence of free air. No radio-opaque calculi or other significant radiographic abnormality is seen. IMPRESSION: Negative. Electronically Signed   By: Duanne Guess D.O.   On: 01/13/2024 14:20    Assessment: 89 y.o. year old male with past medical history of Afib on warfarin, s/p AVR, CAD, HTN, pacemaker placement, GERD, h/of gastric AVM in 2017, admitted to [facility] on 01/10/24 with hypovolemic shock, hemodynamic instability requiring pressor support initially in setting of aspiration pneumonia, dehydration, AKI. GI was consulted due to diarrhea x 3 weeks.   Diarrhea:  History of baseline constipation managed with Linzess prn which typically would cause loose stool. Now with diarrhea x 3 weeks after exposure to multiple antibiotics. Last dose of antibiotic was this admission on 3/22. Concern for C diff, but both toxin and antigen are negative this admission.  GI pathogen panel also negative. Diarrhea continues today though consistency is a little better with 1 large mushy BM this morning. Received 2 doses of imodium yesterday and 1 this morning. Will start him on a lactose free diet to see if this helps as  he may have depleted his lactase enzyme in the setting of significant diarrhea. Hospitalist started pancreatic enzymes, though not weight based.    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 Could consider outpatient surveillance though I considering his age, I would favor conservative management.   Dysphagia:  Chronic in the setting of oropharyngeal dysphagia as well as esophageal dysmotility.  Patient denies any worsening symptoms recently.  He describes sensation of liquids or food  sitting in his throat with items sometimes wanting to come back up. Denies sensation of items getting hung in his chest/esophageal region. Last BPE 12/21/22 showing laryngeal penetration, aspiration of contrast material, vallecular and pyriform sinus residuals, moderate age related diffuse esophageal dysmotility.  Patient not able to swallow barium tablet.  No definite esophageal mass or stricture seen.  He has been seen by speech in the past with most recent evaluation earlier this month with bedside swallow evaluation and recommended D3 dysphagia diet with thin liquids. Due to recurrent aspiration pneumonia and concern with swallowing essentially any textures aside from full liquid, we have requested SLP to re-evaluate patient.   Notably, he does report burning in his esophagus when drinking ginger ale, but otherwise, no odynophagia. Suspect this is related to the carbonation in combination of esophageal dysmotility. Wouldn't expect that he has any significant esophagitis. Regardless, he is on PPI BID.   Plan: Lactose free diet.  Continue imodium.  Monitor for persistent diarrhea.  Continue strict Is and Os.  SLP consult today.  Continue PPI BID   LOS: 4 days    01/14/2024, 10:05 AM   Ermalinda Memos, [facility]

## 2024-01-15 ENCOUNTER — Encounter

## 2024-01-15 DIAGNOSIS — Z952 Presence of prosthetic heart valve: Secondary | ICD-10-CM | POA: Diagnosis not present

## 2024-01-15 DIAGNOSIS — N179 Acute kidney failure, unspecified: Secondary | ICD-10-CM | POA: Diagnosis not present

## 2024-01-15 DIAGNOSIS — K869 Disease of pancreas, unspecified: Secondary | ICD-10-CM | POA: Diagnosis not present

## 2024-01-15 DIAGNOSIS — K224 Dyskinesia of esophagus: Secondary | ICD-10-CM | POA: Diagnosis not present

## 2024-01-15 DIAGNOSIS — E871 Hypo-osmolality and hyponatremia: Secondary | ICD-10-CM | POA: Diagnosis not present

## 2024-01-15 DIAGNOSIS — R1312 Dysphagia, oropharyngeal phase: Secondary | ICD-10-CM | POA: Diagnosis not present

## 2024-01-15 DIAGNOSIS — R197 Diarrhea, unspecified: Secondary | ICD-10-CM | POA: Diagnosis not present

## 2024-01-15 LAB — RENAL FUNCTION PANEL
Albumin: 2.2 g/dL — ABNORMAL LOW (ref 3.5–5.0)
Anion gap: 6 (ref 5–15)
BUN: 48 mg/dL — ABNORMAL HIGH (ref 8–23)
CO2: 16 mmol/L — ABNORMAL LOW (ref 22–32)
Calcium: 8.2 mg/dL — ABNORMAL LOW (ref 8.9–10.3)
Chloride: 120 mmol/L — ABNORMAL HIGH (ref 98–111)
Creatinine, Ser: 2.02 mg/dL — ABNORMAL HIGH (ref 0.61–1.24)
GFR, Estimated: 30 mL/min — ABNORMAL LOW (ref >60)
Glucose, Bld: 126 mg/dL — ABNORMAL HIGH (ref 70–99)
Phosphorus: 2 mg/dL — ABNORMAL LOW (ref 2.5–4.6)
Potassium: 5.5 mmol/L — ABNORMAL HIGH (ref 3.5–5.1)
Sodium: 142 mmol/L (ref 135–145)

## 2024-01-15 LAB — BASIC METABOLIC PANEL
Anion gap: 5 (ref 5–15)
Anion gap: 9 (ref 5–15)
BUN: 46 mg/dL — ABNORMAL HIGH (ref 8–23)
BUN: 44 mg/dL — ABNORMAL HIGH (ref 8–23)
CO2: 15 mmol/L — ABNORMAL LOW (ref 22–32)
CO2: 14 mmol/L — ABNORMAL LOW (ref 22–32)
Calcium: 8 mg/dL — ABNORMAL LOW (ref 8.9–10.3)
Calcium: 8.1 mg/dL — ABNORMAL LOW (ref 8.9–10.3)
Chloride: 121 mmol/L — ABNORMAL HIGH (ref 98–111)
Chloride: 117 mmol/L — ABNORMAL HIGH (ref 98–111)
Creatinine, Ser: 1.97 mg/dL — ABNORMAL HIGH (ref 0.61–1.24)
Creatinine, Ser: 1.82 mg/dL — ABNORMAL HIGH (ref 0.61–1.24)
GFR, Estimated: 31 mL/min — ABNORMAL LOW (ref >60)
GFR, Estimated: 34 mL/min — ABNORMAL LOW (ref >60)
Glucose, Bld: 115 mg/dL — ABNORMAL HIGH (ref 70–99)
Glucose, Bld: 112 mg/dL — ABNORMAL HIGH (ref 70–99)
Potassium: 4.8 mmol/L (ref 3.5–5.1)
Potassium: 3.6 mmol/L (ref 3.5–5.1)
Sodium: 141 mmol/L (ref 135–145)
Sodium: 140 mmol/L (ref 135–145)

## 2024-01-15 LAB — CULTURE, BLOOD (ROUTINE X 2)
Culture: NO GROWTH
Culture: NO GROWTH
Special Requests: ADEQUATE

## 2024-01-15 LAB — CBC
HCT: 31.9 % — ABNORMAL LOW (ref 39.0–52.0)
Hemoglobin: 10.3 g/dL — ABNORMAL LOW (ref 13.0–17.0)
MCH: 29 pg (ref 26.0–34.0)
MCHC: 32.3 g/dL (ref 30.0–36.0)
MCV: 89.9 fL (ref 80.0–100.0)
Platelets: 49 10*3/uL — ABNORMAL LOW (ref 150–400)
RBC: 3.55 MIL/uL — ABNORMAL LOW (ref 4.22–5.81)
RDW: 15.7 % — ABNORMAL HIGH (ref 11.5–15.5)
WBC: 11 10*3/uL — ABNORMAL HIGH (ref 4.0–10.5)
nRBC: 0 % (ref 0.0–0.2)

## 2024-01-15 LAB — PROTIME-INR
INR: 3.2 — ABNORMAL HIGH (ref 0.8–1.2)
Prothrombin Time: 33.2 s — ABNORMAL HIGH (ref 11.4–15.2)

## 2024-01-15 MED ORDER — SODIUM BICARBONATE 650 MG PO TABS
650.0000 mg | ORAL_TABLET | Freq: Two times a day (BID) | ORAL | Status: DC
  Administered 2024-01-15 – 2024-01-16 (×2): 650 mg via ORAL
  Filled 2024-01-15 (×2): qty 1

## 2024-01-15 MED ORDER — K PHOS MONO-SOD PHOS DI & MONO 155-852-130 MG PO TABS
250.0000 mg | ORAL_TABLET | Freq: Three times a day (TID) | ORAL | Status: DC
  Administered 2024-01-15: 250 mg via ORAL
  Filled 2024-01-15: qty 1

## 2024-01-15 MED ORDER — SODIUM PHOSPHATES 45 MMOLE/15ML IV SOLN
30.0000 mmol | Freq: Once | INTRAVENOUS | Status: AC
  Administered 2024-01-15: 30 mmol via INTRAVENOUS
  Filled 2024-01-15: qty 10

## 2024-01-15 NOTE — Evaluation (Signed)
 Physical Therapy Evaluation Patient Details Name: Douglas [name] MRN: [id number] DOB: Apr 23, 1928 Today's Date: 01/15/2024  History of Present Illness  Douglas [name] is a 89 y.o. male with medical history significant of Afib on warfarin, s/p AVR, CAD, HTN, h/o AVM, h/o esophageal ulcer, h/o CHB with pacemaker in place, GERD.  He was recently admitted to the hospital due to bacteremia and sepsis due to pneumonia.  He was treated with antibiotics and discharged to home on 3/4.  Since that day, he has had several episodes of watery nonbloody diarrhea.  This has been going on for about 3 weeks.  He did go see his primary care who prescribed him antidiarrheal agents, but this did not help.  He has been feeling cold, fatigued and was finally brought to the hospital by his family due to him not improving.       Here, he was found to be hypothermic.  His sodium level was 128, his creatinine was 4.33 (baseline is approximately 1.1), his initial lactic acid was 2.8 with a repeat of 1.6 after fluid resuscitation.  Additionally, his INR is 9.8.  His blood pressure has been fairly soft dropping into the 80s to 90s systolically.  He was fluid resuscitated with 30 mL/kg with improvement, but still a little soft.     He had a CT of his abdomen, which showed air-fluid levels in the colon, but no colitis or diverticulitis.   Clinical Impression  Pt presents with family at chairside. Pt demonstrates decreased independence with functional transfers, specifically STS and bed to chair transfer as he requires min/mod assist at this time. Pt demonstrates decreased LE strength and endurance during ambulation with shuffling gait, narrow base of support, and decreased gait speed. Pt also demonstrates increased reliance on RW for maintaining standing balance. Pt would continue to benefit from skilled acute PT for addressing deficits states above for increased independence with functional transfers, decreased fall risk, and return to  higher level of function.      If plan is discharge home, recommend the following: Assistance with cooking/housework;Assist for transportation;Help with stairs or ramp for entrance;A lot of help with walking and/or transfers;A lot of help with bathing/dressing/bathroom   Can travel by private vehicle   No    Equipment Recommendations None recommended by PT  Recommendations for Other Services       Functional Status Assessment Patient has had a recent decline in their functional status and demonstrates the ability to make significant improvements in function in a reasonable and predictable amount of time.     Precautions / Restrictions Precautions Precautions: Fall Recall of Precautions/Restrictions: Intact Restrictions Weight Bearing Restrictions Per Provider Order: No      Mobility  Bed Mobility               General bed mobility comments: pt in recliner upon PT arrival    Transfers Overall transfer level: Needs assistance Equipment used: Rolling walker (2 wheels) Transfers: Sit to/from Stand, Bed to chair/wheelchair/BSC Sit to Stand: Min assist, Mod assist   Step pivot transfers: Contact guard assist, Min assist       General transfer comment: assist with keeping walker on the ground while Pt pulls up to stand    Ambulation/Gait Ambulation/Gait assistance: Contact guard assist Gait Distance (Feet): 35 Feet Assistive device: Rolling walker (2 wheels) Gait Pattern/deviations: Step-through pattern, Trunk flexed, Narrow base of support, Shuffle Gait velocity: decreased     General Gait Details: shuffling gait, NBoS, decreased speed  Stairs            Wheelchair Mobility     Tilt Bed    Modified Rankin (Stroke Patients Only)       Balance Overall balance assessment: Mild deficits observed, not formally tested                                           Pertinent Vitals/Pain Pain Assessment Pain Assessment: No/denies  pain    Home Living Family/patient expects to be discharged to:: Private residence Living Arrangements: Spouse/significant other Available Help at Discharge: Family;Available 24 hours/day Type of Home: House Home Access: Stairs to enter Entrance Stairs-Rails: Right Entrance Stairs-Number of Steps: 4   Home Layout: One level Home Equipment: BSC/3in1;Cane - single point;Rollator (4 wheels);Rolling Walker (2 wheels) Additional Comments: 3 in 1 used as shower chair if needed per pt    Prior Function Prior Level of Function : Independent/Modified Independent             Mobility Comments: dairy farmer, 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 ADLs Comments: needed occassional assitance since last hospitalization     Extremity/Trunk Assessment   Upper Extremity Assessment Upper Extremity Assessment: Overall WFL for tasks assessed    Lower Extremity Assessment Lower Extremity Assessment: Overall WFL for tasks assessed;Generalized weakness    Cervical / Trunk Assessment Cervical / Trunk Assessment: Kyphotic  Communication   Communication Communication: No apparent difficulties    Cognition Arousal: Alert Behavior During Therapy: WFL for tasks assessed/performed   PT - Cognitive impairments: No apparent impairments                         Following commands: Intact       Cueing Cueing Techniques: Verbal cues, Tactile cues     General Comments      Exercises     Assessment/Plan    PT Assessment Patient needs continued PT services  PT Problem List Decreased strength;Decreased activity tolerance;Decreased balance;Decreased mobility       PT Treatment Interventions DME instruction;Gait training;Stair training;Functional mobility training;Therapeutic activities;Therapeutic exercise;Balance training;Patient/family education    PT Goals (Current goals can be found in the Care Plan section)  Acute Rehab PT Goals Patient Stated Goal: to get  stronger before returning home PT Goal Formulation: With patient Time For Goal Achievement: 01/29/24 Potential to Achieve Goals: Good    Frequency Min 3X/week     Co-evaluation               AM-PAC PT "6 Clicks" Mobility  Outcome Measure Help needed turning from your back to your side while in a flat bed without using bedrails?: A Little Help needed moving from lying on your back to sitting on the side of a flat bed without using bedrails?: A Little Help needed moving to and from a bed to a chair (including a wheelchair)?: A Lot Help needed standing up from a chair using your arms (e.g., wheelchair or bedside chair)?: A Lot Help needed to walk in hospital room?: A Little Help needed climbing 3-5 steps with a railing? : A Lot 6 Click Score: 15    End of Session Equipment Utilized During Treatment: Gait belt Activity Tolerance: Patient tolerated treatment well;Patient limited by fatigue Patient left: in chair;with call bell/phone within reach;with family/visitor present Nurse Communication: Mobility status PT Visit Diagnosis: Unsteadiness on  feet (R26.81);Other abnormalities of gait and mobility (R26.89);Muscle weakness (generalized) (M62.81);History of falling (Z91.81)    Time: 3086-5784 PT Time Calculation (min) (ACUTE ONLY): 23 min   Charges:     PT Treatments $Therapeutic Activity: 8-22 mins PT General Charges $$ ACUTE PT VISIT: 1 Visit         Luz Lex, PT, DPT [facility] Office: [phone] 12:28 PM, 01/15/24

## 2024-01-15 NOTE — Progress Notes (Addendum)
 Subjective: Doing okay this morning. Reports that he has not had any BMs this morning, had a small stool overnight, unsure if this was diarrhea or not. Endorses some "soreness" to his abdomen but no pain. No nausea or vomiting. Ate some oatmeal this morning but states it was not very good due to no milk with it.   Objective: Vital signs in last 24 hours: Temp:  [97.6 F (36.4 C)-98.1 F (36.7 C)] 98 F (36.7 C) (03/26 0226) Pulse Rate:  [68-73] 70 (03/26 0226) Resp:  [16-25] 21 (03/26 0226) BP: (110-141)/(27-52) 110/32 (03/26 0226) SpO2:  [93 %-100 %] 100 % (03/26 0226) Last BM Date : 01/14/24 General:   Alert and oriented, pleasant Head:  Normocephalic and atraumatic. Eyes:  No icterus, sclera clear. Conjuctiva pink.  Mouth:  Without lesions, mucosa pink and moist.   Heart:  S1, S2 present, no murmurs noted.  Lungs: Clear to auscultation bilaterally, without wheezing, rales, or rhonchi.  Abdomen:  Bowel sounds present, soft, non-tender, non-distended. No HSM or hernias noted. No rebound or guarding. No masses appreciated  Psych:  Alert and cooperative. Normal mood and affect.  Intake/Output from previous day: 03/25 0701 - 03/26 0700 In: 240 [P.O.:240] Out: 450 [Urine:450] Intake/Output this shift: No intake/output data recorded.  Lab Results: Recent Labs    01/13/24 1252 01/14/24 0413 01/15/24 0421  WBC 11.3* 9.8 11.0*  HGB 10.8* 10.3* 10.3*  HCT 32.9* 31.0* 31.9*  PLT 48* 43* 49*   BMET Recent Labs    01/14/24 0413 01/15/24 0421 01/15/24 0422  NA 138 141 142  K 4.5 4.8 5.5*  CL 120* 121* 120*  CO2 14* 15* 16*  GLUCOSE 130* 115* 126*  BUN 53* 46* 48*  CREATININE 2.25* 1.97* 2.02*  CALCIUM 7.6* 8.0* 8.2*   LFT Recent Labs    01/14/24 0413 01/15/24 0422  ALBUMIN 2.1* 2.2*   PT/INR Recent Labs    01/13/24 0416 01/14/24 0413  LABPROT 28.0* 37.3*  INR 2.6* 3.8*   Assessment: Douglas [name] is a74 year old male with past medical history of Afib on  warfarin, s/p AVR, CAD, HTN, pacemaker placement, GERD, h/of gastric AVM in 2017, admitted to [facility] on 01/10/24 with hypovolemic shock, hemodynamic instability requiring pressor support initially in setting of aspiration pneumonia, dehydration, AKI. GI consulted due to diarrhea x 3 weeks.    Diarrhea: Patient with history of baseline constipation, managed with Linzess as needed.  Now with diarrhea x 3 weeks after exposure to multiple antibiotics.  Last dose of antibiotic was on 3/22.  C. difficile toxin and antigen both negative.  GI pathogen panel negative.  Has been receiving Imodium.  Also on risaquad which I recommend we continue for atleast the next 10-14 days. Started on lactose-free diet yesterday, hospitalist also started pancreatic enzymes. No BMs this morning. A small one overnight. Seems we are moving in the right direction. Will continue with current regimen.   Pancreatic lesions: Likely sidebranch IPMN's on CT this admission.  Consider outpatient surveillance though considering his age, would favor conservative management.  Dysphagia: Chronic in setting of oropharyngeal dysphagia as well as esophageal dysmotility.  No worsening of symptoms recently.  Denies sensation of food/liquid getting hung in his chest/esophageal region but with sensation of foods or liquids sitting in his throat and sometimes sensation of regurgitation.  Last BPE 12/21/2022 with laryngeal penetration, aspiration of contrast material, vallecular and pyriform sinus residuals, moderate age-related diffuse esophageal dysmotility.  Patient not able to swallow barium tablet.  NO definitive esophageal mass or stricture seen.  Has been seen by SLP in the past though Due to recurrent aspiration pneumonia, SLP was requested to reevaluate the patient yesterday, they recommended D2 diet with thin liquids and esophageal precautions.  Patient is on PPI twice daily. Has more coughing with colder liquids but does okay with room temp liquids per  his family at bedside.    Plan: Continue lactose-free diet Continue Imodium Can continue creon, if no results can stop Monitor for persistent diarrhea Appreciate SLP recommendations Continue PPI twice daily Strict I's and O's Continue probiotic    LOS: 5 days    01/15/2024, 8:50 AM  Zonnique Norkus L. Jeanmarie Hubert, MSN, APRN, AGNP-C Adult-Gerontology Nurse Practitioner [facility] at [facility]

## 2024-01-15 NOTE — TOC Progression Note (Addendum)
 Transition of Care [facility]) - Progression Note    Patient Details  Name: Douglas [name] MRN: [id number] Date of Birth: 10-14-1928  Transition of Care [facility]) CM/SW Contact  Villa Herb, [place] Phone Number: 01/15/2024, 1:11 PM  Clinical Narrative:    CSW updated that PT is recommending SNF for pt at D/C. CSW spoke with pt at bedside about this, pt states that he feels this might be needed. CSW explained referral process and he is agreeable to SNF referral being sent out. Pt also states he may be able to go home and see if his family can assist when needed. Pt agreeable to CSW reaching out to family. CSW left VM for pts son requesting call back. CSW to complete and send out SNF referral in case that pt and family feel this is needed. TOC to follow.   Addendum 2 pm: CSW spoke with pts son [name] to review PT recommendation, he states this will be best for pt so he can get stronger before returning home. [name] states pts wife was at [facility] not long go and pt would likely be most agreeable to this SNF. CSW confirmed [facility] offered a bed via [facility]. TOC to follow.   Expected Discharge Plan: Home w Home Health Services Barriers to Discharge: Continued Medical Work up  Expected Discharge Plan and Services   Discharge Planning Services: CM Consult Post Acute Care Choice: Home Health Living arrangements for the past 2 months: Single Family Home                             HH Agency: [facility]         Social Determinants of Health (SDOH) Interventions SDOH Screenings   Food Insecurity: No Food Insecurity (01/10/2024)  Housing: Low Risk  (01/10/2024)  Transportation Needs: No Transportation Needs (01/10/2024)  Utilities: Not At Risk (01/10/2024)  Social Connections: Moderately Isolated (01/10/2024)  Tobacco Use: Low Risk  (01/10/2024)    Readmission Risk Interventions    01/15/2024    1:11 PM 01/10/2024    8:19 PM 12/19/2023    3:34 PM  Readmission Risk Prevention Plan   Transportation Screening Complete Complete Complete  PCP or Specialist Appt within 3-5 Days  Complete   Home Care Screening   Complete  Medication Review (RN CM)   Complete  HRI or Home Care Consult Complete Complete   Social Work Consult for Recovery Care Planning/Counseling Complete Complete   Palliative Care Screening Not Applicable Not Applicable   Medication Review Oceanographer) Complete Complete

## 2024-01-15 NOTE — Plan of Care (Signed)
  Problem: Acute Rehab PT Goals(only PT should resolve) Goal: Pt Will Go Supine/Side To Sit Outcome: Progressing Flowsheets (Taken 01/15/2024 1229) Pt will go Supine/Side to Sit: with supervision Goal: Patient Will Transfer Sit To/From Stand Outcome: Progressing Flowsheets (Taken 01/15/2024 1229) Patient will transfer sit to/from stand: with contact guard assist Goal: Pt Will Transfer Bed To Chair/Chair To Bed Outcome: Progressing Flowsheets (Taken 01/15/2024 1229) Pt will Transfer Bed to Chair/Chair to Bed: with supervision Goal: Pt Will Ambulate Outcome: Progressing Flowsheets (Taken 01/15/2024 1229) Pt will Ambulate:  100 feet  with modified independence  with contact guard assist   Luz Lex, PT, DPT [facility] Office: [phone] 12:29 PM, 01/15/24

## 2024-01-15 NOTE — Progress Notes (Signed)
 PROGRESS NOTE  Douglas [name], is a 89 y.o. male, DOB - [birth year], ZOX:[id number]  Admit date - 01/10/2024   Admitting Physician Deija Buhrman Mariea Clonts, MD  Outpatient Primary MD for the patient is Carylon Perches, MD  LOS - 5  Chief Complaint  Patient presents with   Diarrhea   Weakness      Brief Narrative:   89 y.o. male with medical history significant of Afib on warfarin, s/p AVR (prior history of aortic stenosis and prior history of aortic valve endocarditis), CAD, HTN, h/o AVM, h/o esophageal ulcer, h/o CHB with pacemaker in place, GERD admitted on 01/10/2023 with severe sepsis with septic shock requiring Neo-Synephrine for pressure support   -Assessment and Plan: 1)Hypovolemic shock shock and hemodynamic instability requiring Neo-Synephrine for pressor support---suspect aspiration pneumonia related -- Weaned off Neo-Synephrine as of 01/11/2024 -BP improved with IV fluids -History of Enterococcus faecalis bacteremia from 12/19/2023 -Blood and urine cultures from 01/10/2024 NGTD -Okay to stop IV Rocephin after 01/11/2024 dose-patient in view of persistent diarrhea after recent antibiotic therapy --Stopped Flagyl on 01/11/2024 --C. difficile negative--stool for C. difficile negative --okay to use Imodium prn -Urine culture negative -stopped Flagyl -CT abdomen and pelvis without colitis or diverticulitis, no acute abdominopelvic findings on CT -In retrospect patient probably did Not have frank sepsis--- hypotension was most likely related to volume depletion in the setting of profuse diarrhea for over 2 weeks with hypovolemia/hypotension and metabolic acidosis -- 2)Hypothermia--patient developing hypothermia again -BP stable -Hypothermia resolved -Blood cultures from 01/13/2024 NGTD -A.m. cortisol level WNL  3)Hyponatremia--- due to GI losses/dehydration -Sodium on admission was 128 -Sodium normalized with hydration -A.m. cortisol level WNL  4)AKI----acute kidney injury with metabolic  acidosis in the setting of GI losses creatinine on admission=4.33 (peak on 01/10/24) -- baseline creatinine =  1.04 (on 12/24/2023) -- creatinine is improving with hydration and improvement in BP (creatinine is currently down to 2.25 (from peak of 4.33) -c/n  bicarb replacements (bicarb is up to 14 from 7) -Urine output is Not great --renally adjust medications, avoid nephrotoxic agents / dehydration  / hypotension   5) chronic thrombocytopenia--platelets are lower than baseline -No bleeding concerns, - continue to monitor  6)PAFib/history of prior aortic valve replacement--supratherapeutic INR noted -Patient with vitamin K -Continue to hold Coumadin as INR remains therapeutic -Okay to start IV heparin bridge when INR gets close to 2  7)Hypokalemia/Hypomagnesemia/hypophosphatemia--give sodium phosphorus and recheck   8)Diarrhea--persistent diarrhea for about 3 weeks now--leading to volume depletion with dehydration and hypotension as well as electrolyte derangement -Stool culture/GI pathogen and stool for C. difficile negative -Continue as needed Imodium -GI consult appreciated -Continue probiotic  9)Chronic anemia--- hemoglobin currently above  10 which is close to prior baseline -No obvious bleeding concerns -Continue to monitor  10)Social/Ethics--plan of care and advanced directive discussed with patient to find members  -patient and family request DNR/DNI status  Status is: Inpatient   Disposition: The patient is from: Home              Anticipated d/c is to: SNF              Anticipated d/c date is: 1 day              Patient currently is not medically stable to d/c. Barriers: Not Clinically Stable-   Code Status :  -  Code Status: Do not attempt resuscitation (DNR) PRE-ARREST INTERVENTIONS DESIRED   Family Communication:   Was discussed with son Brett Canales, daughter-in-law Elease Hashimoto, and -daughter Clydie Braun  DVT Prophylaxis  :   - SCDs  Place and maintain sequential compression  device Start: 01/13/24 1307 Place TED hose Start: 01/13/24 1307 SCDs Start: 01/10/24 1851   Lab Results  Component Value Date   PLT 49 (L) 01/15/2024   Inpatient Medications  Scheduled Meds:  acidophilus  2 capsule Oral TID   Chlorhexidine Gluconate Cloth  6 each Topical Q0600   feeding supplement  1 Container Oral TID BM   lipase/protease/amylase  24,000 Units Oral TID AC   pantoprazole (PROTONIX) IV  40 mg Intravenous Q12H   pravastatin  20 mg Oral QPM   sodium bicarbonate  650 mg Oral BID   Continuous Infusions:  sodium PHOSPHATE IVPB (in mmol)     PRN Meds:.acetaminophen, diclofenac Sodium, loperamide, ondansetron **OR** ondansetron (ZOFRAN) IV, mouth rinse, oxyCODONE   Anti-infectives (From admission, onward)    Start     Dose/Rate Route Frequency Ordered Stop   01/10/24 1815  cefTRIAXone (ROCEPHIN) 2 g in sodium chloride 0.9 % 100 mL IVPB  Status:  Discontinued        2 g 200 mL/hr over 30 Minutes Intravenous Every 24 hours 01/10/24 1802 01/12/24 1046   01/10/24 1815  metroNIDAZOLE (FLAGYL) IVPB 500 mg  Status:  Discontinued        500 mg 100 mL/hr over 60 Minutes Intravenous Every 12 hours 01/10/24 1802 01/11/24 1916       Subjective: Douglas [name] today has no emesis,  No chest pain,   --- 1 BM over the last 12 hours or so C/o  fatigue and generalized weakness  Objective: Vitals:   01/14/24 1820 01/14/24 2128 01/15/24 0226 01/15/24 1343  BP: (!) 124/35 (!) 120/40 (!) 110/32 (!) 111/46  Pulse: 73 70 70 75  Resp: 19 (!) 21 (!) 21 20  Temp: 98 F (36.7 C) 98.1 F (36.7 C) 98 F (36.7 C) 98.3 F (36.8 C)  TempSrc: Oral Oral  Oral  SpO2: 93% 100% 100% 99%  Weight:      Height:        Intake/Output Summary (Last 24 hours) at 01/15/2024 1642 Last data filed at 01/15/2024 0158 Gross per 24 hour  Intake --  Output 450 ml  Net -450 ml   Filed Weights   01/10/24 0926 01/10/24 1854  Weight: 97 kg 84.6 kg   Physical Exam Gen:- Awake Alert,  in no  apparent distress  HEENT:- [place].AT, No sclera icterus Neck-Supple Neck,No JVD,.  Lungs-  CTAB , fair symmetrical air movement CV- S1, S2 normal, regular , 2/6 SM, prior sternotomy scar, pacemaker in situ Abd-  +ve B.Sounds, Abd Soft, No tenderness,    Extremity/Skin:- No  edema, pedal pulses present  Psych-affect is appropriate, oriented x3 Neuro-generalized weakness, no new focal deficits, no tremors  Data Reviewed: I have personally reviewed following labs and imaging studies  CBC: Recent Labs  Lab 01/10/24 0936 01/11/24 0200 01/12/24 0906 01/13/24 1252 01/14/24 0413 01/15/24 0421  WBC 9.9 8.2 10.0 11.3* 9.8 11.0*  NEUTROABS 5.6  --   --  7.2  --   --   HGB 13.2 11.6* 11.2* 10.8* 10.3* 10.3*  HCT 41.7 35.5* 34.6* 32.9* 31.0* 31.9*  MCV 92.3 91.5 91.5 89.6 89.3 89.9  PLT 117* 100* 46* 48* 43* 49*   Basic Metabolic Panel: Recent Labs  Lab 01/12/24 0414 01/13/24 0416 01/14/24 0413 01/15/24 0421 01/15/24 0422 01/15/24 1513  NA 131* 138 138 141 142 140  K 3.0* 3.7 4.5 4.8 5.5* 3.6  CL 113* 118* 120* 121* 120* 117*  CO2 10* 11* 14* 15* 16* 14*  GLUCOSE 106* 148* 130* 115* 126* 112*  BUN 58* 54* 53* 46* 48* 44*  CREATININE 3.41* 2.83* 2.25* 1.97* 2.02* 1.82*  CALCIUM 7.3* 7.8* 7.6* 8.0* 8.2* 8.1*  MG 1.3*  --   --   --   --   --   PHOS  --   --  1.5*  --  2.0*  --    GFR: Estimated Creatinine Clearance: 24.8 mL/min (A) (by C-G formula based on SCr of 1.82 mg/dL (H)). Liver Function Tests: Recent Labs  Lab 01/10/24 0936 01/14/24 0413 01/15/24 0422  AST 14*  --   --   ALT 10  --   --   ALKPHOS 100  --   --   BILITOT 0.6  --   --   PROT 5.9*  --   --   ALBUMIN 2.9* 2.1* 2.2*   Recent Results (from the past 240 hours)  Culture, blood (Routine x 2)     Status: None   Collection Time: 01/10/24  9:36 AM   Specimen: BLOOD  Result Value Ref Range Status   Specimen Description BLOOD LEFT ASSIST CONTROL  Final   Special Requests   Final    BOTTLES DRAWN AEROBIC AND  ANAEROBIC Blood Culture results may not be optimal due to an inadequate volume of blood received in culture bottles   Culture   Final    NO GROWTH 5 DAYS Performed at [facility], [address], [place], [place] [address]    Report Status 01/15/2024 FINAL  Final  Urine Culture     Status: None   Collection Time: 01/10/24  9:36 AM   Specimen: Urine, Random  Result Value Ref Range Status   Specimen Description   Final    URINE, RANDOM Performed at [facility], [address], [place], [place] [address]    Special Requests   Final    NONE Reflexed from [id number] Performed at [facility], [address] [place], [place] [address]    Culture   Final    NO GROWTH Performed at [facility], [address] [place], [place] [address]    Report Status 01/11/2024 FINAL  Final  Culture, blood (Routine x 2)     Status: None   Collection Time: 01/10/24  9:41 AM   Specimen: BLOOD  Result Value Ref Range Status   Specimen Description BLOOD RIGHT ASSIST CONTROL  Final   Special Requests   Final    BOTTLES DRAWN AEROBIC AND ANAEROBIC Blood Culture adequate volume   Culture   Final    NO GROWTH 5 DAYS Performed at [facility], [address] [place], [place] [address]    Report Status 01/15/2024 FINAL  Final  Gastrointestinal Panel by PCR , Stool     Status: None   Collection Time: 01/10/24 10:00 AM   Specimen: Stool  Result Value Ref Range Status   Campylobacter species NOT DETECTED NOT DETECTED Final   Plesimonas shigelloides NOT DETECTED NOT DETECTED Final   Salmonella species NOT DETECTED NOT DETECTED Final   Yersinia enterocolitica NOT DETECTED NOT DETECTED Final   Vibrio species NOT DETECTED NOT DETECTED Final   Vibrio cholerae NOT DETECTED NOT DETECTED Final   Enteroaggregative E coli (EAEC) NOT DETECTED NOT DETECTED Final   Enteropathogenic E coli (EPEC) NOT DETECTED NOT DETECTED Final   Enterotoxigenic E coli (ETEC) NOT DETECTED NOT DETECTED Final  Shiga like toxin  producing E coli (STEC) NOT DETECTED NOT DETECTED Final   Shigella/Enteroinvasive E coli (EIEC) NOT DETECTED NOT DETECTED Final   Cryptosporidium NOT DETECTED NOT DETECTED Final   Cyclospora cayetanensis NOT DETECTED NOT DETECTED Final   Entamoeba histolytica NOT DETECTED NOT DETECTED Final   Giardia lamblia NOT DETECTED NOT DETECTED Final   Adenovirus F40/41 NOT DETECTED NOT DETECTED Final   Astrovirus NOT DETECTED NOT DETECTED Final   Norovirus GI/GII NOT DETECTED NOT DETECTED Final   Rotavirus A NOT DETECTED NOT DETECTED Final   Sapovirus (I, II, IV, and V) NOT DETECTED NOT DETECTED Final    Comment: Performed at [facility], [address] [place], [place] [address]  C Difficile Quick Screen w PCR reflex     Status: None   Collection Time: 01/10/24 10:00 AM   Specimen: Stool  Result Value Ref Range Status   C Diff antigen NEGATIVE NEGATIVE Final   C Diff toxin NEGATIVE NEGATIVE Final   C Diff interpretation No C. difficile detected.  Final    Comment: Performed at [facility], [address], [place], [place] [address]  MRSA Next Gen by PCR, Nasal     Status: None   Collection Time: 01/10/24  6:23 PM   Specimen: Nasal Mucosa; Nasal Swab  Result Value Ref Range Status   MRSA by PCR Next Gen NOT DETECTED NOT DETECTED Final    Comment: (NOTE) The GeneXpert MRSA Assay (FDA approved for NASAL specimens only), is one component of a comprehensive MRSA colonization surveillance program. It is not intended to diagnose MRSA infection nor to guide or monitor treatment for MRSA infections. Test performance is not FDA approved in patients less than 49 years old. Performed at [facility], [address] [place], [place] [address]   Culture, blood (Routine X 2) w Reflex to ID Panel     Status: None (Preliminary result)   Collection Time: 01/13/24  4:34 PM   Specimen: BLOOD LEFT HAND  Result Value Ref Range Status   Specimen Description   Final    BLOOD LEFT HAND BOTTLES DRAWN  AEROBIC AND ANAEROBIC   Special Requests Blood Culture adequate volume  Final   Culture   Final    NO GROWTH 2 DAYS Performed at [facility], [address], [place], [place] [address]    Report Status PENDING  Incomplete  Culture, blood (Routine X 2) w Reflex to ID Panel     Status: None (Preliminary result)   Collection Time: 01/13/24  4:34 PM   Specimen: BLOOD LEFT HAND  Result Value Ref Range Status   Specimen Description BLOOD LEFT HAND AEROBIC BOTTLE ONLY  Final   Special Requests   Final    Blood Culture results may not be optimal due to an inadequate volume of blood received in culture bottles   Culture   Final    NO GROWTH 2 DAYS Performed at [facility], [address], [place], [place] [address]    Report Status PENDING  Incomplete    Radiology Studies: No results found.  Scheduled Meds:  acidophilus  2 capsule Oral TID   Chlorhexidine Gluconate Cloth  6 each Topical Q0600   feeding supplement  1 Container Oral TID BM   lipase/protease/amylase  24,000 Units Oral TID AC   pantoprazole (PROTONIX) IV  40 mg Intravenous Q12H   pravastatin  20 mg Oral QPM   sodium bicarbonate  650 mg Oral BID   Continuous Infusions:  sodium  PHOSPHATE IVPB (in mmol)      LOS: 5 days   Shon Hale M.D on 01/15/2024 at 4:42 PM  Go to [email or web address] - for contact info  [facility] - Office  [phone]  If 7PM-7AM, please contact night-coverage [email or web address] 01/15/2024, 4:42 PM

## 2024-01-15 NOTE — [place] FL2 (Signed)
 [place] MEDICAID FL2 LEVEL OF CARE FORM     IDENTIFICATION  Patient Name: Douglas [name] Birthdate: 08-26-1928 Sex: male Admission Date (Current Location): 01/10/2024  [place] and [place] Number:  [employer] and Address:  [facility],  [address], [place] [address]      Provider Number: [id number]  Attending Physician Name and Address:  Shon Hale, MD  Relative Name and Phone Number:       Current Level of Care: Hospital Recommended Level of Care: Skilled Nursing Facility Prior Approval Number:    Date Approved/Denied:   PASRR Number: [id number]  Discharge Plan: SNF    Current Diagnoses: Patient Active Problem List   Diagnosis Date Noted   Sepsis (HCC) 01/10/2024   Acute hyponatremia 01/10/2024   Dehydration 01/10/2024   Diarrhea 01/10/2024   Supratherapeutic INR 01/10/2024   Acute bacterial endocarditis 12/23/2023   Bacteremia 12/21/2023   Acute systolic (congestive) heart failure (HCC) 12/21/2023   Pressure injury of skin 12/21/2023   CAP (community acquired pneumonia) 12/19/2023   AKI (acute kidney injury) (HCC) 12/19/2023   Leukocytosis 12/19/2023   Lactic acidosis 12/19/2023   Generalized weakness 12/19/2023   Hyperglycemia 12/19/2023   Oropharyngeal dysphagia 12/24/2022   Regurgitation of food 12/24/2022   Hemorrhoid 12/11/2017   Constipation 12/11/2017   Iron deficiency anemia due to chronic blood loss 10/03/2016   Symptomatic anemia    Hypotension    History of esophageal ulcer    H/O arteriovenous malformation (AVM)    Chronic anticoagulation - Coumadin, CHADS2VASC=5    UTI (urinary tract infection) 01/16/2015   SIRS (systemic inflammatory response syndrome) (HCC) 01/16/2015   Precordial pain 06/15/2014   Aortic valve replaced 08/25/2012   Pacemaker-[employer] 07/29/2012   Atrial fibrillation (HCC) 02/11/2012   CHB (complete heart block) (HCC) 01/31/2012   Aneurysm of thoracic aorta (HCC)    Coronary  atherosclerosis of native coronary artery 05/10/2011   Essential hypertension, benign    Aortic stenosis     Orientation RESPIRATION BLADDER Height & Weight     Self, Time, Situation, Place  Normal Continent Weight: 186 lb 8.2 oz (84.6 kg) Height:  5\' 5"  (165.1 cm)  BEHAVIORAL SYMPTOMS/MOOD NEUROLOGICAL BOWEL NUTRITION STATUS      Continent Diet (See D/C summary)  AMBULATORY STATUS COMMUNICATION OF NEEDS Skin   Extensive Assist Verbally Normal                       Personal Care Assistance Level of Assistance  Bathing, Feeding, Dressing Bathing Assistance: Limited assistance Feeding assistance: Independent Dressing Assistance: Limited assistance     Functional Limitations Info  Sight, Hearing, Speech Sight Info: Impaired Hearing Info: Adequate Speech Info: Adequate    SPECIAL CARE FACTORS FREQUENCY  PT (By licensed PT), OT (By licensed OT)     PT Frequency: 5 times weekly OT Frequency: 5 times weekly            Contractures Contractures Info: Not present    Additional Factors Info  Code Status, Allergies Code Status Info: DNR Allergies Info: Imdur (isosorbide Nitrate)           Current Medications (01/15/2024):  This is the current hospital active medication list Current Facility-Administered Medications  Medication Dose Route Frequency Provider Last Rate Last Admin   acetaminophen (TYLENOL) tablet 650 mg  650 mg Oral Q6H PRN Emokpae, Courage, MD   650 mg at 01/15/24 0453   acidophilus (RISAQUAD) capsule 2 capsule  2 capsule  Oral TID Orson Aloe, RPH   2 capsule at 01/15/24 1610   Chlorhexidine Gluconate Cloth 2 % PADS 6 each  6 each Topical Q0600 Levie Heritage, DO   6 each at 01/15/24 9604   diclofenac Sodium (VOLTAREN) 1 % topical gel 2 g  2 g Topical QID PRN Shon Hale, MD   2 g at 01/14/24 1113   feeding supplement (BOOST / RESOURCE BREEZE) liquid 1 Container  1 Container Oral TID BM Letta Median, PA-C   1 Container at 01/15/24  1204   lipase/protease/amylase (CREON) capsule 24,000 Units  24,000 Units Oral TID Edwin Dada, Courage, MD   24,000 Units at 01/15/24 1203   loperamide (IMODIUM) capsule 4 mg  4 mg Oral Q4H PRN Shon Hale, MD   4 mg at 01/14/24 0804   ondansetron (ZOFRAN) tablet 4 mg  4 mg Oral Q6H PRN Levie Heritage, DO       Or   ondansetron [facility]) injection 4 mg  4 mg Intravenous Q6H PRN Levie Heritage, DO       Oral care mouth rinse  15 mL Mouth Rinse PRN Levie Heritage, DO       oxyCODONE (Oxy IR/ROXICODONE) immediate release tablet 5 mg  5 mg Oral Q8H PRN Emokpae, Courage, MD   5 mg at 01/13/24 2211   pantoprazole (PROTONIX) injection 40 mg  40 mg Intravenous Q12H Levie Heritage, DO   40 mg at 01/15/24 5409   phosphorus (K PHOS NEUTRAL) tablet 250 mg  250 mg Oral TID Shon Hale, MD   250 mg at 01/15/24 0905   pravastatin (PRAVACHOL) tablet 20 mg  20 mg Oral QPM Levie Heritage, DO   20 mg at 01/14/24 1702     Discharge Medications: Please see discharge summary for a list of discharge medications.  Relevant Imaging Results:  Relevant Lab Results:   Additional Information SSN: [id number] [place]

## 2024-01-16 DIAGNOSIS — R1312 Dysphagia, oropharyngeal phase: Secondary | ICD-10-CM | POA: Diagnosis not present

## 2024-01-16 DIAGNOSIS — M810 Age-related osteoporosis without current pathological fracture: Secondary | ICD-10-CM | POA: Diagnosis not present

## 2024-01-16 DIAGNOSIS — M47814 Spondylosis without myelopathy or radiculopathy, thoracic region: Secondary | ICD-10-CM | POA: Diagnosis not present

## 2024-01-16 DIAGNOSIS — I2510 Atherosclerotic heart disease of native coronary artery without angina pectoris: Secondary | ICD-10-CM | POA: Diagnosis not present

## 2024-01-16 DIAGNOSIS — K219 Gastro-esophageal reflux disease without esophagitis: Secondary | ICD-10-CM | POA: Diagnosis not present

## 2024-01-16 DIAGNOSIS — M1000 Idiopathic gout, unspecified site: Secondary | ICD-10-CM | POA: Diagnosis not present

## 2024-01-16 DIAGNOSIS — Z950 Presence of cardiac pacemaker: Secondary | ICD-10-CM | POA: Diagnosis not present

## 2024-01-16 DIAGNOSIS — Z961 Presence of intraocular lens: Secondary | ICD-10-CM | POA: Diagnosis not present

## 2024-01-16 DIAGNOSIS — N179 Acute kidney failure, unspecified: Secondary | ICD-10-CM | POA: Diagnosis not present

## 2024-01-16 DIAGNOSIS — Z23 Encounter for immunization: Secondary | ICD-10-CM | POA: Diagnosis not present

## 2024-01-16 DIAGNOSIS — R1314 Dysphagia, pharyngoesophageal phase: Secondary | ICD-10-CM | POA: Diagnosis not present

## 2024-01-16 DIAGNOSIS — M353 Polymyalgia rheumatica: Secondary | ICD-10-CM | POA: Diagnosis not present

## 2024-01-16 DIAGNOSIS — K222 Esophageal obstruction: Secondary | ICD-10-CM

## 2024-01-16 DIAGNOSIS — K869 Disease of pancreas, unspecified: Secondary | ICD-10-CM | POA: Diagnosis not present

## 2024-01-16 DIAGNOSIS — R739 Hyperglycemia, unspecified: Secondary | ICD-10-CM | POA: Diagnosis not present

## 2024-01-16 DIAGNOSIS — M6281 Muscle weakness (generalized): Secondary | ICD-10-CM | POA: Diagnosis not present

## 2024-01-16 DIAGNOSIS — Z85828 Personal history of other malignant neoplasm of skin: Secondary | ICD-10-CM | POA: Diagnosis not present

## 2024-01-16 DIAGNOSIS — I442 Atrioventricular block, complete: Secondary | ICD-10-CM | POA: Diagnosis not present

## 2024-01-16 DIAGNOSIS — I10 Essential (primary) hypertension: Secondary | ICD-10-CM

## 2024-01-16 DIAGNOSIS — A4181 Sepsis due to Enterococcus: Secondary | ICD-10-CM | POA: Diagnosis not present

## 2024-01-16 DIAGNOSIS — R791 Abnormal coagulation profile: Secondary | ICD-10-CM | POA: Diagnosis not present

## 2024-01-16 DIAGNOSIS — Z9181 History of falling: Secondary | ICD-10-CM | POA: Diagnosis not present

## 2024-01-16 DIAGNOSIS — J189 Pneumonia, unspecified organism: Secondary | ICD-10-CM | POA: Diagnosis not present

## 2024-01-16 DIAGNOSIS — R9389 Abnormal findings on diagnostic imaging of other specified body structures: Secondary | ICD-10-CM | POA: Diagnosis not present

## 2024-01-16 DIAGNOSIS — I110 Hypertensive heart disease with heart failure: Secondary | ICD-10-CM | POA: Diagnosis not present

## 2024-01-16 DIAGNOSIS — M1990 Unspecified osteoarthritis, unspecified site: Secondary | ICD-10-CM | POA: Diagnosis not present

## 2024-01-16 DIAGNOSIS — I480 Paroxysmal atrial fibrillation: Secondary | ICD-10-CM | POA: Diagnosis not present

## 2024-01-16 DIAGNOSIS — A419 Sepsis, unspecified organism: Secondary | ICD-10-CM | POA: Diagnosis not present

## 2024-01-16 DIAGNOSIS — R262 Difficulty in walking, not elsewhere classified: Secondary | ICD-10-CM | POA: Diagnosis not present

## 2024-01-16 DIAGNOSIS — M109 Gout, unspecified: Secondary | ICD-10-CM | POA: Diagnosis not present

## 2024-01-16 DIAGNOSIS — E785 Hyperlipidemia, unspecified: Secondary | ICD-10-CM | POA: Diagnosis not present

## 2024-01-16 DIAGNOSIS — D500 Iron deficiency anemia secondary to blood loss (chronic): Secondary | ICD-10-CM | POA: Diagnosis not present

## 2024-01-16 DIAGNOSIS — E871 Hypo-osmolality and hyponatremia: Secondary | ICD-10-CM | POA: Diagnosis not present

## 2024-01-16 DIAGNOSIS — R6510 Systemic inflammatory response syndrome (SIRS) of non-infectious origin without acute organ dysfunction: Secondary | ICD-10-CM | POA: Diagnosis not present

## 2024-01-16 DIAGNOSIS — I071 Rheumatic tricuspid insufficiency: Secondary | ICD-10-CM | POA: Diagnosis not present

## 2024-01-16 DIAGNOSIS — I5021 Acute systolic (congestive) heart failure: Secondary | ICD-10-CM | POA: Diagnosis not present

## 2024-01-16 DIAGNOSIS — Z951 Presence of aortocoronary bypass graft: Secondary | ICD-10-CM | POA: Diagnosis not present

## 2024-01-16 DIAGNOSIS — R197 Diarrhea, unspecified: Secondary | ICD-10-CM | POA: Diagnosis not present

## 2024-01-16 DIAGNOSIS — R531 Weakness: Secondary | ICD-10-CM | POA: Diagnosis not present

## 2024-01-16 DIAGNOSIS — E43 Unspecified severe protein-calorie malnutrition: Secondary | ICD-10-CM | POA: Diagnosis not present

## 2024-01-16 DIAGNOSIS — Z952 Presence of prosthetic heart valve: Secondary | ICD-10-CM | POA: Diagnosis not present

## 2024-01-16 DIAGNOSIS — I4811 Longstanding persistent atrial fibrillation: Secondary | ICD-10-CM | POA: Diagnosis not present

## 2024-01-16 DIAGNOSIS — M797 Fibromyalgia: Secondary | ICD-10-CM | POA: Diagnosis not present

## 2024-01-16 DIAGNOSIS — E860 Dehydration: Secondary | ICD-10-CM | POA: Diagnosis not present

## 2024-01-16 LAB — RENAL FUNCTION PANEL
Albumin: 2.1 g/dL — ABNORMAL LOW (ref 3.5–5.0)
Anion gap: 5 (ref 5–15)
BUN: 42 mg/dL — ABNORMAL HIGH (ref 8–23)
CO2: 16 mmol/L — ABNORMAL LOW (ref 22–32)
Calcium: 7.9 mg/dL — ABNORMAL LOW (ref 8.9–10.3)
Chloride: 119 mmol/L — ABNORMAL HIGH (ref 98–111)
Creatinine, Ser: 1.64 mg/dL — ABNORMAL HIGH (ref 0.61–1.24)
GFR, Estimated: 39 mL/min — ABNORMAL LOW (ref >60)
Glucose, Bld: 111 mg/dL — ABNORMAL HIGH (ref 70–99)
Phosphorus: 3.2 mg/dL (ref 2.5–4.6)
Potassium: 3.7 mmol/L (ref 3.5–5.1)
Sodium: 140 mmol/L (ref 135–145)

## 2024-01-16 LAB — PROTIME-INR
INR: 3 — ABNORMAL HIGH (ref 0.8–1.2)
Prothrombin Time: 31.5 s — ABNORMAL HIGH (ref 11.4–15.2)

## 2024-01-16 MED ORDER — WARFARIN SODIUM 5 MG PO TABS
2.5000 mg | ORAL_TABLET | Freq: Every evening | ORAL | Status: DC
Start: 2024-01-18 — End: 2024-01-27

## 2024-01-16 MED ORDER — SODIUM BICARBONATE 650 MG PO TABS: 650.0000 mg | ORAL_TABLET | Freq: Two times a day (BID) | ORAL | 0 refills | Status: DC

## 2024-01-16 MED ORDER — ACETAMINOPHEN 325 MG PO TABS: 650.0000 mg | ORAL_TABLET | Freq: Four times a day (QID) | ORAL | Status: AC | PRN

## 2024-01-16 MED ORDER — PANCRELIPASE (LIP-PROT-AMYL) 12000-38000 UNITS PO CPEP: 24000.0000 [IU] | ORAL_CAPSULE | Freq: Three times a day (TID) | ORAL | 0 refills | Status: DC

## 2024-01-16 MED ORDER — DIPHENOXYLATE-ATROPINE 2.5-0.025 MG PO TABS: 1.0000 | ORAL_TABLET | Freq: Four times a day (QID) | ORAL | 0 refills | Status: DC | PRN

## 2024-01-16 MED ORDER — RISAQUAD PO CAPS: 2.0000 | ORAL_CAPSULE | Freq: Three times a day (TID) | ORAL | 1 refills | Status: AC

## 2024-01-16 NOTE — Care Management Important Message (Signed)
 Important Message  Patient Details  Name: Douglas [name] MRN: [id number] Date of Birth: 14-Mar-1928   Important Message Given:  Yes - Medicare IM     Corey Harold 01/16/2024, 11:39 AM

## 2024-01-16 NOTE — Progress Notes (Addendum)
 Physical Therapy Treatment Patient Details Name: Douglas [name] MRN: [id number] DOB: 05/16/1928 Today's Date: 01/16/2024   History of Present Illness Douglas [name] is a 89 y.o. male with medical history significant of Afib on warfarin, s/p AVR, CAD, HTN, h/o AVM, h/o esophageal ulcer, h/o CHB with pacemaker in place, GERD.  He was recently admitted to the hospital due to bacteremia and sepsis due to pneumonia.  He was treated with antibiotics and discharged to home on 3/4.  Since that day, he has had several episodes of watery nonbloody diarrhea.  This has been going on for about 3 weeks.  He did go see his primary care who prescribed him antidiarrheal agents, but this did not help.  He has been feeling cold, fatigued and was finally brought to the hospital by his family due to him not improving.       Here, he was found to be hypothermic.  His sodium level was 128, his creatinine was 4.33 (baseline is approximately 1.1), his initial lactic acid was 2.8 with a repeat of 1.6 after fluid resuscitation.  Additionally, his INR is 9.8.  His blood pressure has been fairly soft dropping into the 80s to 90s systolically.  He was fluid resuscitated with 30 mL/kg with improvement, but still a little soft.     He had a CT of his abdomen, which showed air-fluid levels in the colon, but no colitis or diverticulitis.    PT Comments  Pt presents with increased ability to complete functional transfers with less assistance today CGA/Min assist. Pt able to use arm rest to rise from chair instead of pulling up from walker. Pt demonstrates ability to ambulate for increased distance today with RW and VC for pacing and management of RW. Pt demonstrates need for assistance with bathroom transfers and pericare. Pt would continue to benefit from skilled acute PT to address deficits mentioned above for increased independence with ADL and household ambulation for improved quality of life and return to [facility].    If plan is discharge  home, recommend the following: Assistance with cooking/housework;Assist for transportation;Help with stairs or ramp for entrance;A little help with walking and/or transfers;A little help with bathing/dressing/bathroom   Can travel by private vehicle     No  Equipment Recommendations  None recommended by PT    Recommendations for Other Services       Precautions / Restrictions Precautions Precautions: Fall Recall of Precautions/Restrictions: Intact Restrictions Weight Bearing Restrictions Per Provider Order: No     Mobility  Bed Mobility               General bed mobility comments: pt in recliner upon PT arrival    Transfers Overall transfer level: Needs assistance Equipment used: Rolling walker (2 wheels) Transfers: Sit to/from Stand, Bed to chair/wheelchair/BSC Sit to Stand: Min assist, Contact guard assist   Step pivot transfers: Contact guard assist, Min assist       General transfer comment: Pt able to rise from chair with use of UEs on arm rests today.    Ambulation/Gait Ambulation/Gait assistance: Contact guard assist Gait Distance (Feet): 75 Feet Assistive device: Rolling walker (2 wheels) Gait Pattern/deviations: Step-through pattern, Trunk flexed, Narrow base of support, Shuffle Gait velocity: decreased     General Gait Details: shuffling gait, NBoS, decreased speed   Stairs             Wheelchair Mobility     Tilt Bed    Modified Rankin (Stroke Patients Only)  Balance Overall balance assessment: Needs assistance Sitting-balance support: Feet supported Sitting balance-Leahy Scale: Good     Standing balance support: Bilateral upper extremity supported Standing balance-Leahy Scale: Fair                              Hotel manager: No apparent difficulties  Cognition Arousal: Alert Behavior During Therapy: WFL for tasks assessed/performed   PT - Cognitive impairments: No apparent  impairments                         Following commands: Intact      Cueing Cueing Techniques: Verbal cues, Tactile cues  Exercises      General Comments        Pertinent Vitals/Pain Pain Assessment Pain Assessment: 0-10 Pain Score: 8  Pain Location: butt Pain Descriptors / Indicators: Aching Pain Intervention(s): Limited activity within patient's tolerance, Repositioned    Home Living                          Prior Function            PT Goals (current goals can now be found in the care plan section) Acute Rehab PT Goals Patient Stated Goal: to get stronger before returning home PT Goal Formulation: With patient Time For Goal Achievement: 01/29/24 Potential to Achieve Goals: Good Progress towards PT goals: Progressing toward goals    Frequency    Min 3X/week      PT Plan      Co-evaluation              AM-PAC PT "6 Clicks" Mobility   Outcome Measure  Help needed turning from your back to your side while in a flat bed without using bedrails?: A Little Help needed moving from lying on your back to sitting on the side of a flat bed without using bedrails?: A Little Help needed moving to and from a bed to a chair (including a wheelchair)?: A Little Help needed standing up from a chair using your arms (e.g., wheelchair or bedside chair)?: A Little Help needed to walk in hospital room?: A Little Help needed climbing 3-5 steps with a railing? : A Lot 6 Click Score: 17    End of Session Equipment Utilized During Treatment: Gait belt Activity Tolerance: Patient tolerated treatment well;Patient limited by fatigue Patient left: in chair;with call bell/phone within reach;with family/visitor present Nurse Communication: Mobility status PT Visit Diagnosis: Unsteadiness on feet (R26.81);Other abnormalities of gait and mobility (R26.89);Muscle weakness (generalized) (M62.81);History of falling (Z91.81)     Time: 0630-1601 PT Time  Calculation (min) (ACUTE ONLY): 29 min  Charges:    $Therapeutic Activity: 23-37 mins PT General Charges $$ ACUTE PT VISIT: 1 Visit                     Luz Lex, PT, DPT [facility] Office: [phone] 12:00 PM, 01/16/24

## 2024-01-16 NOTE — TOC Transition Note (Signed)
 Transition of Care [facility]) - Discharge Note   Patient Details  Name: Douglas [name] MRN: [id number] Date of Birth: 1928/02/19  [facility] CM/SW Contact:  Villa Herb, LCSWA Phone Number: 01/16/2024, 2:00 PM   Clinical Narrative:    CSW spoke with pt at bedside to confirm agreement for SNF at [facility], pt is agreeable to this. Multiple family members updated on plan for SNF at [facility] and D/C today. CSW updated Kerri with [facility] of plan. MD completed D/C. RN provided with report number. TOC signing off.   Final next level of care: Skilled Nursing Facility Barriers to Discharge: Barriers Resolved   Patient Goals and CMS Choice Patient states their goals for this hospitalization and ongoing recovery are:: go to [facility] CMS Medicare.gov Compare Post Acute Care list provided to:: Patient Choice offered to / list presented to : Patient [place] ownership interest in [facility].provided to:: Patient    Discharge Placement              Patient chooses bed at: [facility] Patient to be transferred to facility by: Facility staff Name of family member notified: Multiple family members Patient and family notified of of transfer: 01/16/24  Discharge Plan and Services Additional resources added to the After Visit Summary for     Discharge Planning Services: CM Consult Post Acute Care Choice: Home Health                      [facility] Agency: [facility]        Social Drivers of Health (SDOH) Interventions SDOH Screenings   Food Insecurity: No Food Insecurity (01/10/2024)  Housing: Low Risk  (01/10/2024)  Transportation Needs: No Transportation Needs (01/10/2024)  Utilities: Not At Risk (01/10/2024)  Social Connections: Moderately Isolated (01/10/2024)  Tobacco Use: Low Risk  (01/10/2024)     Readmission Risk Interventions    01/15/2024    1:11 PM 01/10/2024    8:19 PM 12/19/2023    3:34 PM  Readmission Risk Prevention Plan  Transportation Screening  Complete Complete Complete  PCP or Specialist Appt within 3-5 Days  Complete   Home Care Screening   Complete  Medication Review (RN CM)   Complete  HRI or Home Care Consult Complete Complete   Social Work Consult for Recovery Care Planning/Counseling Complete Complete   Palliative Care Screening Not Applicable Not Applicable   Medication Review Oceanographer) Complete Complete

## 2024-01-16 NOTE — Progress Notes (Signed)
 Mobility Specialist Progress Note:    01/16/24 1420  Mobility  Activity Ambulated with assistance in hallway;Ambulated with assistance to bathroom  Level of Assistance Contact guard assist, steadying assist  Distance Ambulated (ft) 45 ft  Range of Motion/Exercises Active;All extremities  Activity Response Tolerated well  Mobility Referral Yes  Mobility visit 1 Mobility  Mobility Specialist Start Time (ACUTE ONLY) 1355  Mobility Specialist Stop Time (ACUTE ONLY) 1420  Mobility Specialist Time Calculation (min) (ACUTE ONLY) 25 min   Pt received in chair, family at bedside. Agreeable to mobility, required CGA to stand and ambulate with RW. Tolerated well, asx throughout. Returned to room, left pt in chair. Alarm on, call bell in reach. All needs met.  Lawerance Bach Mobility Specialist Please contact via Special educational needs teacher or  Rehab office at [phone]

## 2024-01-16 NOTE — Discharge Instructions (Addendum)
 1)Avoid ibuprofen/Advil/Aleve/Motrin/Goody Powders/Naproxen/BC powders/Meloxicam/Diclofenac/Indomethacin and other Nonsteroidal anti-inflammatory medications as these will make you more likely to bleed and can cause stomach ulcers, can also cause Kidney problems.   2)Repeat PT/INR, CBC and BMP Blood Tests on Monday 01/20/24   3)TED stockings on every morning off every evening  4)Creon as prescribed for 1 month only  5)Dysphagia 2 diet--Fine chopped meals with Regular/Thin Liquids -- Lactose-free diet advised for now until diarrhea is completely resolved  6)Restart Coumadin/warfarin on 01/18/2024

## 2024-01-16 NOTE — Plan of Care (Signed)
 Pt expressed a small round bm that was brown in color. Pt is able to get to [facility] w/one person assist.    Problem: Health Behavior/Discharge Planning: Goal: Ability to manage health-related needs will improve Outcome: Progressing   Problem: Clinical Measurements: Goal: Ability to maintain clinical measurements within normal limits will improve Outcome: Progressing Goal: Will remain free from infection Outcome: Progressing Goal: Diagnostic test results will improve Outcome: Progressing Goal: Respiratory complications will improve Outcome: Progressing Goal: Cardiovascular complication will be avoided Outcome: Progressing   Problem: Activity: Goal: Risk for activity intolerance will decrease Outcome: Progressing   Problem: Nutrition: Goal: Adequate nutrition will be maintained Outcome: Progressing   Problem: Coping: Goal: Level of anxiety will decrease Outcome: Progressing   Problem: Elimination: Goal: Will not experience complications related to bowel motility Outcome: Progressing   Problem: Pain Managment: Goal: General experience of comfort will improve and/or be controlled Outcome: Progressing   Problem: Safety: Goal: Ability to remain free from injury will improve Outcome: Progressing   Problem: Skin Integrity: Goal: Risk for impaired skin integrity will decrease Outcome: Progressing

## 2024-01-16 NOTE — Plan of Care (Signed)
  Problem: Health Behavior/Discharge Planning: Goal: Ability to manage health-related needs will improve Outcome: Adequate for Discharge   Problem: Clinical Measurements: Goal: Ability to maintain clinical measurements within normal limits will improve Outcome: Adequate for Discharge Goal: Will remain free from infection Outcome: Adequate for Discharge Goal: Diagnostic test results will improve Outcome: Adequate for Discharge Goal: Respiratory complications will improve Outcome: Adequate for Discharge Goal: Cardiovascular complication will be avoided Outcome: Adequate for Discharge   Problem: Activity: Goal: Risk for activity intolerance will decrease Outcome: Adequate for Discharge   Problem: Nutrition: Goal: Adequate nutrition will be maintained Outcome: Adequate for Discharge   Problem: Coping: Goal: Level of anxiety will decrease Outcome: Adequate for Discharge   Problem: Elimination: Goal: Will not experience complications related to bowel motility Outcome: Adequate for Discharge Goal: Will not experience complications related to urinary retention Outcome: Adequate for Discharge   Problem: Pain Managment: Goal: General experience of comfort will improve and/or be controlled Outcome: Adequate for Discharge   Problem: Safety: Goal: Ability to remain free from injury will improve Outcome: Adequate for Discharge   Problem: Skin Integrity: Goal: Risk for impaired skin integrity will decrease Outcome: Adequate for Discharge   Problem: SLP Dysphagia Goals Goal: Patient will utilize recommended strategies Description: Patient will utilize recommended strategies during swallow to increase swallowing safety with Outcome: Adequate for Discharge   Problem: Acute Rehab PT Goals(only PT should resolve) Goal: Pt Will Go Supine/Side To Sit Outcome: Adequate for Discharge Goal: Patient Will Transfer Sit To/From Stand Outcome: Adequate for Discharge Goal: Pt Will Transfer  Bed To Chair/Chair To Bed Outcome: Adequate for Discharge Goal: Pt Will Ambulate Outcome: Adequate for Discharge

## 2024-01-16 NOTE — Progress Notes (Signed)
 Gastroenterology Progress Note   Referring Provider: No ref. provider found Primary Care Physician:  Carylon Perches, MD Primary Gastroenterologist: Katrinka Blazing, MD Patient ID: Douglas [name]; [id number]; 21-Oct-1928   Subjective:    Feels better. Ambulated in the hallway. Having about 2 stools daily, less frequent, more consistency. Small bristol 5 last night. Appetite is fair. No postprandial abdominal pain. He has some soreness with going from sitting to standing.   Objective:   Vital signs in last 24 hours: Temp:  [98 F (36.7 C)-98.3 F (36.8 C)] 98 F (36.7 C) (03/27 0437) Pulse Rate:  [71-75] 71 (03/26 2027) Resp:  [20-21] 20 (03/27 0437) BP: (97-123)/(46-80) 97/80 (03/27 0437) SpO2:  [94 %-100 %] 94 % (03/27 0437) Last BM Date : 01/15/24 General:   Alert,  Well-developed, well-nourished, pleasant and cooperative in NAD Head:  Normocephalic and atraumatic. Eyes:  Sclera clear, no icterus.  Chest: CTA bilaterally without rales, rhonchi, crackles.    Heart:  Regular rate and rhythm; no murmurs, clicks, rubs,  or gallops. Abdomen:  Soft, nontender and nondistended.   Normal bowel sounds, without guarding, and without rebound.   Neurologic:  Alert and  oriented x4;  grossly normal neurologically.  Psych:  Alert and cooperative. Normal mood and affect.  Intake/Output from previous day: 03/26 0701 - 03/27 0700 In: 972.5 [P.O.:960; IV Piggyback:12.5] Out: 1100 [Urine:1100] Intake/Output this shift: No intake/output data recorded.  Lab Results: CBC Recent Labs    01/13/24 1252 01/14/24 0413 01/15/24 0421  WBC 11.3* 9.8 11.0*  HGB 10.8* 10.3* 10.3*  HCT 32.9* 31.0* 31.9*  MCV 89.6 89.3 89.9  PLT 48* 43* 49*   BMET Recent Labs    01/15/24 0422 01/15/24 1513 01/16/24 0409  NA 142 140 140  K 5.5* 3.6 3.7  CL 120* 117* 119*  CO2 16* 14* 16*  GLUCOSE 126* 112* 111*  BUN 48* 44* 42*  CREATININE 2.02* 1.82* 1.64*  CALCIUM 8.2* 8.1* 7.9*   LFTs Recent Labs     01/14/24 0413 01/15/24 0422 01/16/24 0409  ALBUMIN 2.1* 2.2* 2.1*   No results for input(s): "LIPASE" in the last 72 hours. PT/INR Recent Labs    01/14/24 0413 01/15/24 0940 01/16/24 0409  LABPROT 37.3* 33.2* 31.5*  INR 3.8* 3.2* 3.0*         Imaging Studies: DG Abd 2 Views Result Date: 01/13/2024 CLINICAL DATA:  19147 Abdominal distension [id number] EXAM: ABDOMEN - 2 VIEW COMPARISON:  01/10/2024 FINDINGS: The bowel gas pattern is nonobstructive. There is no evidence of free air. No radio-opaque calculi or other significant radiographic abnormality is seen. IMPRESSION: Negative. Electronically Signed   By: Duanne Guess D.O.   On: 01/13/2024 14:20   CT ABDOMEN PELVIS WO CONTRAST Result Date: 01/10/2024 CLINICAL DATA:  Abdominal pain, diarrhea, weakness EXAM: CT ABDOMEN AND PELVIS WITHOUT CONTRAST TECHNIQUE: Multidetector CT imaging of the abdomen and pelvis was performed following the standard protocol without IV contrast. RADIATION DOSE REDUCTION: This exam was performed according to the departmental dose-optimization program which includes automated exposure control, adjustment of the mA and/or kV according to patient size and/or use of iterative reconstruction technique. COMPARISON:  CT abdomen and pelvis dated 03/03/2021, renal ultrasound dated 03/28/2021 FINDINGS: Lower chest: Rounded left lower lobe atelectasis adjacent to the loculated small left pleural effusion. Partially imaged multichamber cardiomegaly. Partially imaged pacemaker leads terminate in the right atrium and ventricle. Hepatobiliary: No focal hepatic lesions. No intra or extrahepatic biliary ductal dilation. Normal gallbladder. Pancreas: Subcentimeter hypodensities in  the pancreatic uncinate process. No main ductal dilation. Spleen: Normal in size without focal abnormality. Adrenals/Urinary Tract: 12 mm right adrenal nodule contains gross fat, unchanged from 03/03/2021, likely myelolipoma. Additional subcentimeter  myelolipoma in the left adrenal gland. Again seen are bilateral renal cysts, a few of which are mildly hyperattenuating and may be hemorrhagic/proteinaceous. No hydronephrosis. Punctate nonobstructing right lower pole stone. Underdistended urinary bladder. Stomach/Bowel: Normal appearance of the stomach. No evidence of bowel wall thickening, distention, or inflammatory changes. Colonic diverticulosis without acute diverticulitis. Layering fluid density within the colon. Appendix is not discretely seen. Vascular/Lymphatic: Aortic atherosclerosis. No enlarged abdominal or pelvic lymph nodes. Reproductive: Prostate is unremarkable. Other: No free fluid, fluid collection, or free air. Musculoskeletal: No acute or abnormal lytic or blastic osseous lesions. Partially imaged median sternotomy wires are nondisplaced. Multilevel degenerative changes of the partially imaged thoracic and lumbar spine. Small fat-containing right inguinal hernia. Mild left flank body wall edema. IMPRESSION: 1. No acute abdominopelvic findings. 2. Layering fluid density within the colon in keeping with diarrheal illness. 3. Colonic diverticulosis without acute diverticulitis. 4. Rounded left lower lobe atelectasis adjacent to the loculated small left pleural effusion. 5. Subcentimeter hypodensities in the pancreatic uncinate process, likely side branch IPMNs. Follow-up imaging in 2 years can be considered with contrast-enhanced MRI/MRCP according to the patient's goals of care. 6.  Aortic Atherosclerosis (ICD10-I70.0). Electronically Signed   By: Agustin Cree M.D.   On: 01/10/2024 13:43   DG Chest Port 1 View Result Date: 01/10/2024 CLINICAL DATA:  Sepsis EXAM: PORTABLE CHEST 1 VIEW COMPARISON:  Chest radiograph dated 12/19/2023 FINDINGS: Lines/tubes: Left chest wall pacemaker leads project over the right atrium and ventricle. Lungs: Well inflated lungs. Decreased but persistent patchy left retrocardiac opacities. Pleura: Unchanged trace blunting  of the left costophrenic angle. No pneumothorax. Heart/mediastinum: Similar mildly enlarged, postsurgical cardiomediastinal silhouette. Bones: Median sternotomy wires are nondisplaced. IMPRESSION: 1. Decreased but persistent patchy left retrocardiac opacities, which may represent atelectasis or resolving aspiration or pneumonia. 2. Unchanged trace blunting of the left costophrenic angle, which may represent a trace pleural effusion or pleural thickening. Electronically Signed   By: Agustin Cree M.D.   On: 01/10/2024 10:13   ECHO TEE Result Date: 12/23/2023    TRANSESOPHOGEAL ECHO REPORT   Patient Name:   LAQUENTIN LOUDERMILK Date of Exam: 12/23/2023 Medical Rec #:  [id number]     Height:       66.0 in Accession #:    [id number]    Weight:       205.7 lb Date of Birth:  02/28/1928     BSA:          2.024 m Patient Age:    89 years      BP:           157/72 mmHg Patient Gender: M             HR:           69 bpm. Exam Location:  Inpatient Procedure: Transesophageal Echo and 3D Echo (Both Spectral and Color Flow            Doppler were utilized during procedure). Indications:     endocarditis  History:         Patient has prior history of Echocardiogram examinations, most                  recent 12/20/2023. Pacemaker and Bentall, Arrythmias:Atrial  Fibrillation; Risk Factors:Hypertension.                  Aortic Valve: 23 mm Edwards bioprosthetic valve is present in                  the aortic position. Procedure Date: 01/24/12.  Sonographer:     Delcie Roch RDCS Referring Phys:  [id number] Jonita Albee Diagnosing Phys: Jodelle Red MD PROCEDURE: After discussion of the risks and benefits of a TEE, an informed consent was obtained from the patient. The transesophogeal probe was passed without difficulty through the esophogus of the patient. Imaged were obtained with the patient in a left lateral decubitus position. Sedation performed by different physician. The patient was monitored while under deep  sedation. Anesthestetic sedation was provided intravenously by Anesthesiology: 354mg  of Propofol, 100mg  of Lidocaine. Image quality was  good. The patient's vital signs; including heart rate, blood pressure, and oxygen saturation; remained stable throughout the procedure. The patient developed no complications during the procedure.  IMPRESSIONS  1. Left ventricular ejection fraction, by estimation, is 40 to 45%. The left ventricle has mildly decreased function. The left ventricle demonstrates global hypokinesis.  2. Right ventricular systolic function is low normal. The right ventricular size is mildly enlarged. There is moderately elevated pulmonary artery systolic pressure. The estimated right ventricular systolic pressure is 50.0 mmHg.  3. Left atrial size was severely dilated. No left atrial/left atrial appendage thrombus was detected. The LAA emptying velocity was 17 cm/s.  4. Right atrial size was moderately dilated.  5. The mitral valve is normal in structure. Mild to moderate mitral valve regurgitation. No evidence of mitral stenosis.  6. Tricuspid valve regurgitation is moderate.  7. The aortic valve has been repaired/replaced. There is mild calcification of the aortic valve. There is mild thickening of the aortic valve. Aortic valve regurgitation is not visualized. There is a 23 mm Edwards bioprosthetic valve present in the aortic position. Procedure Date: 01/24/12.  8. S/P ascending aorta repair 01/25/2012 with 28 mm Bentall conduit. No evidence of abscess. Aortic root/ascending aorta has been repaired/replaced.  9. 3D performed of the aortic valve and demonstrates No evidence of aortic valve endocarditis. Conclusion(s)/Recommendation(s): No evidence of vegetation/infective endocarditis on this transesophageael echocardiogram. FINDINGS  Left Ventricle: Left ventricular ejection fraction, by estimation, is 40 to 45%. The left ventricle has mildly decreased function. The left ventricle demonstrates global  hypokinesis. The left ventricular internal cavity size was normal in size. Right Ventricle: The right ventricular size is mildly enlarged. Right vetricular wall thickness was not well visualized. Right ventricular systolic function is low normal. There is moderately elevated pulmonary artery systolic pressure. The tricuspid regurgitant velocity is 3.24 m/s, and with an assumed right atrial pressure of 8 mmHg, the estimated right ventricular systolic pressure is 50.0 mmHg. Left Atrium: Left atrial size was severely dilated. No left atrial/left atrial appendage thrombus was detected. The LAA emptying velocity was 17 cm/s. Right Atrium: Right atrial size was moderately dilated. Pericardium: Trivial pericardial effusion is present. Mitral Valve: The mitral valve is normal in structure. Mild to moderate mitral valve regurgitation. No evidence of mitral valve stenosis. There is no evidence of mitral valve vegetation. Tricuspid Valve: The tricuspid valve is normal in structure. Tricuspid valve regurgitation is moderate . No evidence of tricuspid stenosis. There is no evidence of tricuspid valve vegetation. Aortic Valve: The aortic valve has been repaired/replaced. There is mild calcification of the aortic valve. There is mild thickening of the aortic  valve. Aortic valve regurgitation is not visualized. There is a 23 mm Edwards bioprosthetic valve present in the aortic position. Procedure Date: 01/24/12. There is no evidence of aortic valve vegetation. Pulmonic Valve: The pulmonic valve was grossly normal. Pulmonic valve regurgitation is trivial. No evidence of pulmonic stenosis. There is no evidence of pulmonic valve vegetation. Aorta: S/P ascending aorta repair 01/25/2012 with 28 mm Bentall conduit. No evidence of abscess. The aortic root/ascending aorta has been repaired/replaced. IAS/Shunts: No atrial level shunt detected by color flow Doppler. Additional Comments: Spectral Doppler performed. LEFT VENTRICLE PLAX 2D LVOT  diam:     1.80 cm LVOT Area:     2.54 cm  TRICUSPID VALVE TR Peak grad:   42.0 mmHg TR Vmax:        324.00 cm/s  SHUNTS Systemic Diam: 1.80 cm Jodelle Red MD Electronically signed by Jodelle Red MD Signature Date/Time: 12/23/2023/8:19:18 PM    Final    EP STUDY Result Date: 12/23/2023 See surgical note for result.  ECHOCARDIOGRAM COMPLETE Result Date: 12/20/2023    ECHOCARDIOGRAM REPORT   Patient Name:   JULLIAN CLAYSON Date of Exam: 12/20/2023 Medical Rec #:  [id number]     Height:       66.0 in Accession #:    [id number]    Weight:       200.8 lb Date of Birth:  07/06/1928     BSA:          2.003 m Patient Age:    89 years      BP:           150/79 mmHg Patient Gender: M             HR:           79 bpm. Exam Location:  Inpatient Procedure: 2D Echo, Cardiac Doppler, Color Doppler and Intracardiac            Opacification Agent (Both Spectral and Color Flow Doppler were            utilized during procedure). Indications:    Bacteremia  History:        Patient has prior history of Echocardiogram examinations, most                 recent 09/10/2023. Risk Factors:Hypertension.                 Aortic Valve: 23 mm Edwards valve is present in the aortic                 position.  Sonographer:    Karma Ganja Referring Phys: [id number] Cleora Fleet  Sonographer Comments: Technically difficult study due to poor echo windows. IMPRESSIONS  1. Left ventricular ejection fraction, by estimation, is 40%. The left ventricle has mildly decreased function. Left ventricular endocardial border not optimally defined to evaluate regional wall motion. There is moderate left ventricular hypertrophy. Left ventricular diastolic parameters are indeterminate. There is the interventricular septum is flattened in systole and diastole, consistent with right ventricular pressure and volume overload.  2. Right ventricular systolic function was not well visualized. The right ventricular size is not well visualized. There is  moderately elevated pulmonary artery systolic pressure.  3. The mitral valve was not well visualized. No evidence of mitral valve regurgitation. No evidence of mitral stenosis.  4. The tricuspid valve is abnormal.  5. 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  The aortic valve has been repaired/replaced. Aortic valve regurgitation is not visualized. No aortic stenosis is present. There is a 23 mm Edwards valve present in the aortic position.  6. The pulmonic valve was abnormal.  7. The inferior vena cava is normal in size with greater than 50% respiratory variability, suggesting right atrial pressure of 3 mmHg. FINDINGS  Left Ventricle: Left ventricular ejection fraction, by estimation, is 40%. The left ventricle has mildly decreased function. Left ventricular endocardial border not optimally defined to evaluate regional wall motion. Definity contrast agent was given IV  to delineate the left ventricular endocardial borders. Strain imaging was not performed. The left ventricular internal cavity size was normal in size. There is moderate left ventricular hypertrophy. The interventricular septum is flattened in systole and diastole, consistent with right ventricular pressure and volume overload. Left ventricular diastolic parameters are indeterminate. Right Ventricle: The right ventricular size is not well visualized. Right vetricular wall thickness was not well visualized. Right ventricular systolic function was not well visualized. There is moderately elevated pulmonary artery systolic pressure. The  tricuspid regurgitant velocity is 3.50 m/s, and with an assumed right atrial pressure of 3 mmHg, the estimated right ventricular systolic pressure is 52.0 mmHg. Left Atrium: Left atrial size was not well visualized. Right Atrium: Right atrial size was not well visualized. Pericardium: There is no evidence of pericardial effusion.  Mitral Valve: The mitral valve was not well visualized. No evidence of mitral valve regurgitation. No evidence of mitral valve stenosis. Tricuspid Valve: The tricuspid valve is abnormal. Tricuspid valve regurgitation is mild . No evidence of tricuspid stenosis. 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 The aortic valve has been repaired/replaced. Aortic valve regurgitation is not visualized. No aortic stenosis is present. Aortic valve mean gradient measures 1.0 mmHg. Aortic valve peak gradient measures 2.0 mmHg. Aortic valve area, by VTI measures 2.79 cm. There is a 23 mm Edwards valve present in the aortic position. Pulmonic Valve: The pulmonic valve was abnormal. Pulmonic valve regurgitation is mild. No evidence of pulmonic stenosis. Aorta: The aortic root was not well visualized. Venous: The inferior vena cava is normal in size with greater than 50% respiratory variability, suggesting right atrial pressure of 3 mmHg. IAS/Shunts: The interatrial septum was not well visualized. Additional Comments: 3D imaging was not performed. A device lead is visualized in the right atrium and right ventricle.  LEFT VENTRICLE PLAX 2D LVIDd:         4.70 cm      Diastology LVIDs:         3.70 cm      LV e' medial:    8.27 cm/s LV PW:         1.30 cm      LV E/e' medial:  15.1 LV IVS:        1.30 cm      LV e' lateral:   7.83 cm/s LVOT diam:     1.90 cm      LV E/e' lateral: 16.0 LV SV:         33 LV SV Index:   17 LVOT Area:     2.84 cm  LV Volumes (MOD) LV vol d, MOD A2C: 87.5 ml LV vol d, MOD A4C: 114.0 ml LV vol s, MOD A2C: 44.2 ml LV vol s, MOD A4C: 67.6 ml LV SV MOD A2C:     43.3 ml LV SV MOD A4C:  114.0 ml LV SV MOD BP:      46.3 ml RIGHT VENTRICLE            IVC RV Basal diam:  4.50 cm    IVC diam: 1.60 cm RV S prime:     5.87 cm/s TAPSE (M-mode): 1.6 cm LEFT ATRIUM              Index        RIGHT ATRIUM           Index LA  diam:        6.10 cm  3.05 cm/m   RA Area:     25.00 cm LA Vol (A2C):   83.6 ml  41.73 ml/m  RA Volume:   85.70 ml  42.78 ml/m LA Vol (A4C):   118.0 ml 58.91 ml/m LA Biplane Vol: 103.0 ml 51.42 ml/m  AORTIC VALVE AV Area (Vmax):    2.71 cm AV Area (Vmean):   2.62 cm AV Area (VTI):     2.79 cm AV Vmax:           70.60 cm/s AV Vmean:          46.200 cm/s AV VTI:            0.119 m AV Peak Grad:      2.0 mmHg AV Mean Grad:      1.0 mmHg LVOT Vmax:         67.40 cm/s LVOT Vmean:        42.700 cm/s LVOT VTI:          0.117 m LVOT/AV VTI ratio: 0.98 MITRAL VALVE                TRICUSPID VALVE MV Area (PHT): 4.10 cm     TR Peak grad:   49.0 mmHg MV Decel Time: 185 msec     TR Vmax:        350.00 cm/s MV E velocity: 125.00 cm/s MV A velocity: 34.00 cm/s   SHUNTS MV E/A ratio:  3.68         Systemic VTI:  0.12 m                             Systemic Diam: 1.90 cm Dina Rich MD Electronically signed by Dina Rich MD Signature Date/Time: 12/20/2023/3:32:16 PM    Final    DG CHEST PORT 1 VIEW Result Date: 12/19/2023 CLINICAL DATA:  Cough. EXAM: PORTABLE CHEST 1 VIEW COMPARISON:  Chest radiograph dated 12/19/2023 at 5:34 a.m. FINDINGS: Patient is rotated to the left. Stable mild cardiomegaly. Prior median sternotomy and aortic valve repair. Stable left chest wall pacemaker in place. Similar retrocardiac opacity and mild patchy right basilar opacities. No pneumothorax identified. No acute osseous abnormality. IMPRESSION: 1. Similar retrocardiac and mild patchy right basilar opacities could reflect atelectasis or infiltrate. 2. Similar blunting of the left costophrenic angle could reflect scarring or a small effusion. Electronically Signed   By: Hart Robinsons M.D.   On: 12/19/2023 11:49   DG Chest Portable 1 View Result Date: 12/19/2023 CLINICAL DATA:  Persistent cough EXAM: PORTABLE CHEST 1 VIEW COMPARISON:  08/29/2023 FINDINGS: Unchanged left chest wall pacer. Previous median sternotomy and aortic  valve repair. Aortic atherosclerotic calcifications. Stable cardiomediastinal contours. Retrocardiac opacity with indistinct left hemidiaphragm is identified. Blunting of the left costophrenic angle. Mild patchy opacities identified in the right lower lung. Visualized osseous structures are unremarkable. IMPRESSION: 1.  Retrocardiac opacity with indistinct left hemidiaphragm. Findings may reflect atelectasis and/or pneumonia. 2. Mild patchy opacities in the right lower lung. 3. Blunting of left costophrenic angle which may reflect chronic pleuroparenchymal scarring versus small effusion. Electronically Signed   By: Signa Kell M.D.   On: 12/19/2023 06:12  [2 weeks]  Assessment:   89 y.o. year old male with past medical history of Afib on warfarin, s/p AVR, CAD, HTN, pacemaker placement, GERD, h/of gastric AVM in 2017, admitted to [facility] on 01/10/24 with hypovolemic shock, hemodynamic instability requiring pressor support initially in setting of aspiration pneumonia, dehydration, AKI. GI was consulted due to diarrhea x 3 weeks.    Diarrhea:  History of baseline constipation managed with Linzess prn which typically would cause loose stool. Now with diarrhea x 3 weeks after exposure to multiple antibiotics. Last dose of antibiotic was this admission on 3/22. Concern for C diff, but both toxin and antigen are negative this admission.  GI pathogen panel also negative. CT with findings c/w diarrhea illness with fluid in colon. For abd distention later in this stay, Abd xray completed to rule out ileus, was unremarkable. Received imodium 8mg  3/24 and 4mg  3/25. Hospitalist started pancreatic enzymes, although not at goal for weight based dosing. Lactose free diet.   Symptoms improving. Two BMs in the last 24 hours. Last stool last night, small and bristol 5.   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 Could consider outpatient surveillance though I considering his age, I would  favor conservative management.    Dysphagia:  Chronic in the setting of oropharyngeal dysphagia as well as esophageal dysmotility.  Patient denies any worsening symptoms recently.  He describes sensation of liquids or food sitting in his throat with items sometimes wanting to come back up. Denies sensation of items getting hung in his chest/esophageal region. Last BPE 12/21/22 showing laryngeal penetration, aspiration of contrast material, vallecular and pyriform sinus residuals, moderate age related diffuse esophageal dysmotility.  Patient not able to swallow barium tablet.  No definite esophageal mass or stricture seen.  He has been seen by speech in the past with most recent evaluation earlier this month with bedside swallow evaluation and recommended D3 dysphagia diet with thin liquids. Due to recurrent aspiration pneumonia and concern with swallowing essentially any textures aside from full liquid, we SLP re-evaluated patient and advised D2 diet with think liquids with esophageal precautions.       Plan:   Continue imodium as needed. If Creon continued and thought to be effective, consider weight based dosing, otherwise can stop. Continue PPI BID. Continue lactose free diet until diarrheal illness resolves.  Continue probiotic.  Monitor for persistent diarrhea.  GI will sign off. Patient states he will contact Dr. Wilburt Finlay office for follow up if persistent issues with bowels. He did not want to make follow up appt at this time.    LOS: 6 days   Leanna Battles. [facility] [phone] 3/27/20257:55 AM

## 2024-01-16 NOTE — Discharge Summary (Addendum)
 Douglas [name], is a 89 y.o. male  DOB [birth year]  MRN [id number].  Admission date:  01/10/2024  Admitting Physician  Shon Hale, MD  Discharge Date:  01/16/2024   Primary MD  Carylon Perches, MD  Recommendations for primary care physician for things to follow:  1)Avoid ibuprofen/Advil/Aleve/Motrin/Goody Powders/Naproxen/BC powders/Meloxicam/Diclofenac/Indomethacin and other Nonsteroidal anti-inflammatory medications as these will make you more likely to bleed and can cause stomach ulcers, can also cause Kidney problems.   2)Repeat PT/INR, CBC and BMP Blood Tests on Monday 01/20/24   3)TED stockings on every morning off every evening  4)Creon as prescribed for 1 month only  5)Dysphagia 2 diet--Fine chopped meals with Regular/Thin Liquids -- Lactose-free diet advised for now until diarrhea is completely resolved  6)Restart Coumadin/warfarin on 01/18/2024  Admission Diagnosis  SIRS (systemic inflammatory response syndrome) (HCC) [R65.10] AKI (acute kidney injury) (HCC) [N17.9] Sepsis (HCC) [A41.9]  Discharge Diagnosis  SIRS (systemic inflammatory response syndrome) (HCC) [R65.10] AKI (acute kidney injury) (HCC) [N17.9] Sepsis (HCC) [A41.9]    Principal Problem:   Sepsis (HCC) Active Problems:   Essential hypertension, benign   Coronary atherosclerosis of native coronary artery   Atrial fibrillation (HCC)   Aortic valve replaced   AKI (acute kidney injury) (HCC)   Lactic acidosis   Acute hyponatremia   Dehydration   Diarrhea   Supratherapeutic INR     Past Medical History:  Diagnosis Date   Acute upper GI bleed 05/18/2016   Aneurysm of thoracic aorta (HCC)    Ascending. 5.7 x 5.5cm - Bentall procedure   Aortic stenosis    Bioprosthetic AVR   Arthritis    Atrial fibrillation (HCC)    Documented on pacer interrogation 6/13   Complete heart block (HCC)    Status post pacemaker placement    Coronary atherosclerosis of native coronary artery    Multivessel - LIMA to LAD, SVG to OM, SVG to PDA   DJD (degenerative joint disease)    Esophageal dysmotility    Essential hypertension    Fibromyalgia    Gout    H/O arteriovenous malformation (AVM)    H/O hiatal hernia    Hemorrhoid    History of esophageal ulcer    History of skin cancer    Hx of adenomatous colonic polyps    Iron deficiency anemia due to chronic blood loss    Melena    Pacemaker-Medtronic    PMR (polymyalgia rheumatica) (HCC)    SIRS (systemic inflammatory response syndrome) (HCC) 01/16/2015    Past Surgical History:  Procedure Laterality Date   AORTIC VALVE REPLACEMENT  01/24/12   APPENDECTOMY     BACK SURGERY     1965 or 1966 lumbar back surgery   BENTALL PROCEDURE  01/24/2012   Procedure: BENTALL PROCEDURE;  Surgeon: Kerin Perna, MD;  Location: [facility] OR;  Service: Open Heart Surgery;  Laterality: N/A;   BILATERAL KNEE ARTHROSCOPY     CATARACT EXTRACTION W/PHACO Left 12/03/2016   Procedure: CATARACT EXTRACTION PHACO AND INTRAOCULAR LENS PLACEMENT LEFT EYE;  Surgeon: Nash Dimmer  Alto Denver, MD;  Location: [facility];  Service: Ophthalmology;  Laterality: Left;  CDE:  10.87   CATARACT EXTRACTION W/PHACO Right 12/13/2016   Procedure: CATARACT EXTRACTION PHACO AND INTRAOCULAR LENS PLACEMENT (IOC);  Surgeon: Gemma Payor, MD;  Location: [facility];  Service: Ophthalmology;  Laterality: Right;  right cde 11.78   CORONARY ARTERY BYPASS GRAFT  01/24/2012   Procedure: CORONARY ARTERY BYPASS GRAFTING (CABG);  Surgeon: Kerin Perna, MD;  Location: [facility] OR;  Service: Open Heart Surgery;  Laterality: N/A;   ESOPHAGOGASTRODUODENOSCOPY  06/17/2012   Procedure: ESOPHAGOGASTRODUODENOSCOPY (EGD);  Surgeon: Malissa Hippo, MD;  Location: [facility];  Service: Endoscopy;  Laterality: N/A;  ED   ESOPHAGOGASTRODUODENOSCOPY N/A 05/19/2016   Procedure: ESOPHAGOGASTRODUODENOSCOPY (EGD);  Surgeon: West Bali, MD;  Location: [facility];  Service:  Endoscopy;  Laterality: N/A;   HEMORROIDECTOMY     HERNIA REPAIR     Left inguinal hernia repair     PACEMAKER PLACEMENT     Medtronic 4/13 - Dr. Johney Frame   PERMANENT PACEMAKER INSERTION N/A 02/01/2012   Procedure: PERMANENT PACEMAKER INSERTION;  Surgeon: Hillis Range, MD; Medtronic Adapta L model ADDRL 1 (serial number [id number])     TRANSESOPHAGEAL ECHOCARDIOGRAM (CATH LAB) N/A 12/23/2023   Procedure: TRANSESOPHAGEAL ECHOCARDIOGRAM;  Surgeon: Jodelle Red, MD;  Location: [facility] INVASIVE CV LAB;  Service: Cardiovascular;  Laterality: N/A;     HPI  from the history and physical done on the day of admission:    HPI: Douglas [name] is a 89 y.o. male with medical history significant of Afib on warfarin, s/p AVR, CAD, HTN, h/o AVM, h/o esophageal ulcer, h/o CHB with pacemaker in place, GERD.  He was recently admitted to the hospital due to bacteremia and sepsis due to pneumonia.  He was treated with antibiotics and discharged to home on 3/4.  Since that day, he has had several episodes of watery nonbloody diarrhea.  This has been going on for about 3 weeks.  He did go see his primary care who prescribed him antidiarrheal agents, but this did not help.  He has been feeling cold, fatigued and was finally brought to the hospital by his family due to him not improving.     Here, he was found to be hypothermic.  His sodium level was 128, his creatinine was 4.33 (baseline is approximately 1.1), his initial lactic acid was 2.8 with a repeat of 1.6 after fluid resuscitation.  Additionally, his INR is 9.8.  His blood pressure has been fairly soft dropping into the 80s to 90s systolically.  He was fluid resuscitated with 30 mL/kg with improvement, but still a little soft.   He had a CT of his abdomen, which showed air-fluid levels in the colon, but no colitis or diverticulitis.   Review of Systems: As mentioned in the history of present illness. All other systems reviewed and are negative.   Hospital  Course:   Brief Summary:   89 y.o. male with medical history significant of Afib on warfarin, s/p AVR (prior history of aortic stenosis and prior history of aortic valve endocarditis), CAD, HTN, h/o AVM, h/o esophageal ulcer, h/o CHB with pacemaker in place, GERD admitted on 01/10/2023 with severe sepsis with septic shock requiring Neo-Synephrine for pressure support. -Hemodynamically improved, pressors and IV fluids discontinued   -Assessment and Plan: 1)Hypovolemic Shock shock and Hemodynamic instability requiring Neo-Synephrine for pressor support---suspect aspiration pneumonia related -- Weaned off Neo-Synephrine as of 01/11/2024 -BP improved with IV fluids, BP remains  stable after discontinuation of IV fluids -History of Enterococcus faecalis bacteremia from 12/19/2023 -Blood and urine cultures from 01/10/2024 NGTD -Okay to stop IV Rocephin after 01/11/2024 dose-patient in view of persistent diarrhea after recent antibiotic therapy --Stopped Flagyl on 01/11/2024 --C. difficile negative--stool for C. difficile negative --okay to use Imodium prn -Urine culture negative -stopped Flagyl -CT abdomen and pelvis without colitis or diverticulitis, no acute abdominopelvic findings on CT -In retrospect patient probably did Not have frank sepsis--- hypotension was most likely related to volume depletion in the setting of profuse diarrhea for over 2 weeks with hypovolemia/hypotension and metabolic acidosis -- 2)Hypothermia--no further hypothermia --BP and temperature are stable -Blood cultures from 01/13/2024 NGTD -A.m. cortisol level WNL   3)Hyponatremia--- due to GI losses/dehydration -Sodium on admission was 128 -Sodium normalized with hydration -A.m. cortisol level WNL   4)AKI----acute kidney injury with metabolic acidosis in the setting of GI losses creatinine on admission=4.33 (peak on 01/10/24) -- baseline creatinine =  1.04 (on 12/24/2023) -- creatinine is improving with hydration and  improvement in BP (creatinine is currently down to 1.64 (from peak of 4.33) -c/n  bicarb replacements (bicarb is up to 14 from 7) -Urine output has improved --renally adjust medications, avoid nephrotoxic agents / dehydration  / hypotension   5)Chronic Thrombocytopenia--platelets are lower than baseline -No bleeding concerns, -Overall platelets have been low, but stable for the last 4 days or so   6)PAFib/history of prior aortic valve replacement--supratherapeutic INR noted -INR improved with vitamin K -Okay to restart Coumadin on 01/18/2024 as long as patient is eating,  repeat PT/INR on 01/20/2024   7)Hypokalemia/Hypomagnesemia/hypophosphatemia--give sodium phosphorus and recheck    8)Diarrhea--persistent diarrhea for about 3 weeks now--leading to volume depletion with dehydration and hypotension as well as electrolyte derangement -Stool culture/GI pathogen and stool for C. 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   Service: Skilled Nursing Contact information: [address] [place] [address] [phone]                      Consults obtained - Gi   Diet and Activity recommendation:  As advised  Discharge Instructions    Discharge Instructions     Call MD for:  difficulty breathing, headache or visual disturbances   Complete by: As directed    Call MD for:  persistant dizziness or light-headedness   Complete by: As directed    Call MD for:  persistant nausea and vomiting   Complete by: As directed    Call MD for:  temperature >100.4   Complete by: As directed    Diet general   Complete by: As directed    Dysphagia 2 diet--Fine chopped meals  with Regular/Thin Liquids -- Lactose-free diet advised for now until diarrhea is completely resolved   Discharge instructions   Complete by: As directed    1)Avoid ibuprofen/Advil/Aleve/Motrin/Goody Powders/Naproxen/BC powders/Meloxicam/Diclofenac/Indomethacin and other Nonsteroidal anti-inflammatory medications as these will make you more likely to bleed and can cause stomach ulcers, can also cause Kidney problems.   2)Repeat PT/INR, CBC and BMP Blood Tests on Monday 01/20/24   3)TED stockings on every morning off every evening  4)Creon as prescribed for 1 month only  5)Dysphagia 2 diet--Fine chopped meals with Regular/Thin Liquids -- Lactose-free diet advised for now until diarrhea is completely resolved  6)Restart Coumadin/warfarin on 01/18/2024   Increase activity slowly   Complete by: As directed    No wound care   Complete by: As directed        Discharge Medications     Allergies as of 01/16/2024       Reactions   Imdur [isosorbide Nitrate] Other (See Comments)   Nausea and lightheaded         Medication List     TAKE these medications    acetaminophen 325 MG tablet Commonly known as: TYLENOL Take 2 tablets (650 mg total) by mouth every 6 (six) hours as needed for mild pain (pain score 1-3) or fever.   acidophilus Caps capsule Take 2 capsules by mouth 3 (three) times daily.   allopurinol 300 MG tablet Commonly known as:  ZYLOPRIM Take 300 mg by mouth daily. Take 1  daily   diclofenac Sodium 1 % Gel Commonly known as: VOLTAREN Apply 2 g topically 4 (four) times daily. Apply to knees   diphenoxylate-atropine 2.5-0.025 MG tablet Commonly known as: LOMOTIL Take 1 tablet by mouth 4 (four) times daily as needed for diarrhea or loose stools.   furosemide 20 MG tablet Commonly known as: LASIX Take 20 mg by mouth every other day.   lipase/protease/amylase 12000-38000 units Cpep capsule Commonly known as: CREON Take 2 capsules (24,000 Units total) by mouth 3 (three) times daily before meals.   metoprolol succinate 25 MG 24 hr tablet Commonly known as: TOPROL-XL TAKE (2) TABLETS BY MOUTH DAILY.   pantoprazole 40 MG tablet Commonly known as: PROTONIX TAKE ONE TABLET BY MOUTH ONCE DAILY IN THE MORNING.   pravastatin 20 MG tablet Commonly known as: PRAVACHOL Take 20 mg by mouth every evening.   sodium bicarbonate 650 MG tablet Take 1 tablet (650 mg total) by mouth 2 (two) times daily.   warfarin 5 MG tablet Commonly known as: COUMADIN Take as directed. If you are unsure how to take this medication, talk to your nurse or doctor. Original instructions: Take 0.5 tablets (2.5 mg total) by mouth every evening. Take 1 tablet by mouth on Monday, Wednesday, and Friday, and then 0.5 tablet by mouth daily on all the other days Start taking on: January 18, 2024 What changed:  See the new instructions. These instructions start on January 18, 2024. If you are unsure what to do until then, ask your doctor or other care provider.       Major procedures and Radiology Reports - PLEASE review detailed and final reports for all details, in brief -   DG Abd 2 Views Result Date: 01/13/2024 CLINICAL DATA:  25427 Abdominal distension 06237 EXAM: ABDOMEN - 2 VIEW COMPARISON:  01/10/2024 FINDINGS: The bowel gas pattern is nonobstructive. There is no evidence of free air. No radio-opaque calculi or other significant  radiographic abnormality is seen. IMPRESSION: Negative. Electronically Signed   By: Janyth Pupa  Plundo D.O.   On: 01/13/2024 14:20   CT ABDOMEN PELVIS WO CONTRAST Result Date: 01/10/2024 CLINICAL DATA:  Abdominal pain, diarrhea, weakness EXAM: CT ABDOMEN AND PELVIS WITHOUT CONTRAST TECHNIQUE: Multidetector CT imaging of the abdomen and pelvis was performed following the standard protocol without IV contrast. RADIATION DOSE REDUCTION: This exam was performed according to the departmental dose-optimization program which includes automated exposure control, adjustment of the mA and/or kV according to patient size and/or use of iterative reconstruction technique. COMPARISON:  CT abdomen and pelvis dated 03/03/2021, renal ultrasound dated 03/28/2021 FINDINGS: Lower chest: Rounded left lower lobe atelectasis adjacent to the loculated small left pleural effusion. Partially imaged multichamber cardiomegaly. Partially imaged pacemaker leads terminate in the right atrium and ventricle. Hepatobiliary: No focal hepatic lesions. No intra or extrahepatic biliary ductal dilation. Normal gallbladder. Pancreas: Subcentimeter hypodensities in the pancreatic uncinate process. No main ductal dilation. Spleen: Normal in size without focal abnormality. Adrenals/Urinary Tract: 12 mm right adrenal nodule contains gross fat, unchanged from 03/03/2021, likely myelolipoma. Additional subcentimeter myelolipoma in the left adrenal gland. Again seen are bilateral renal cysts, a few of which are mildly hyperattenuating and may be hemorrhagic/proteinaceous. No hydronephrosis. Punctate nonobstructing right lower pole stone. Underdistended urinary bladder. Stomach/Bowel: Normal appearance of the stomach. No evidence of bowel wall thickening, distention, or inflammatory changes. Colonic diverticulosis without acute diverticulitis. Layering fluid density within the colon. Appendix is not discretely seen. Vascular/Lymphatic: Aortic atherosclerosis. No  enlarged abdominal or pelvic lymph nodes. Reproductive: Prostate is unremarkable. Other: No free fluid, fluid collection, or free air. Musculoskeletal: No acute or abnormal lytic or blastic osseous lesions. Partially imaged median sternotomy wires are nondisplaced. Multilevel degenerative changes of the partially imaged thoracic and lumbar spine. Small fat-containing right inguinal hernia. Mild left flank body wall edema. IMPRESSION: 1. No acute abdominopelvic findings. 2. Layering fluid density within the colon in keeping with diarrheal illness. 3. Colonic diverticulosis without acute diverticulitis. 4. Rounded left lower lobe atelectasis adjacent to the loculated small left pleural effusion. 5. Subcentimeter hypodensities in the pancreatic uncinate process, likely side branch IPMNs. Follow-up imaging in 2 years can be considered with contrast-enhanced MRI/MRCP according to the patient's goals of care. 6.  Aortic Atherosclerosis (ICD10-I70.0). Electronically Signed   By: Agustin Cree M.D.   On: 01/10/2024 13:43   DG Chest Port 1 View Result Date: 01/10/2024 CLINICAL DATA:  Sepsis EXAM: PORTABLE CHEST 1 VIEW COMPARISON:  Chest radiograph dated 12/19/2023 FINDINGS: Lines/tubes: Left chest wall pacemaker leads project over the right atrium and ventricle. Lungs: Well inflated lungs. Decreased but persistent patchy left retrocardiac opacities. Pleura: Unchanged trace blunting of the left costophrenic angle. No pneumothorax. Heart/mediastinum: Similar mildly enlarged, postsurgical cardiomediastinal silhouette. Bones: Median sternotomy wires are nondisplaced. IMPRESSION: 1. Decreased but persistent patchy left retrocardiac opacities, which may represent atelectasis or resolving aspiration or pneumonia. 2. Unchanged trace blunting of the left costophrenic angle, which may represent a trace pleural effusion or pleural thickening. Electronically Signed   By: Agustin Cree M.D.   On: 01/10/2024 10:13   ECHO TEE Result Date:  12/23/2023    TRANSESOPHOGEAL ECHO REPORT   Patient Name:   Douglas [name] Date of Exam: 12/23/2023 Medical Rec #:  [id number]     Height:       66.0 in Accession #:    [id number]    Weight:       205.7 lb Date of Birth:  May 11, 1928     BSA:          2.024  m Patient Age:    89 years      BP:           157/72 mmHg Patient Gender: M             HR:           69 bpm. Exam Location:  Inpatient Procedure: Transesophageal Echo and 3D Echo (Both Spectral and Color Flow            Doppler were utilized during procedure). Indications:     endocarditis  History:         Patient has prior history of Echocardiogram examinations, most                  recent 12/20/2023. Pacemaker and Bentall, Arrythmias:Atrial                  Fibrillation; Risk Factors:Hypertension.                  Aortic Valve: 23 mm Edwards bioprosthetic valve is present in                  the aortic position. Procedure Date: 01/24/12.  Sonographer:     Delcie Roch RDCS Referring Phys:  [id number] Jonita Albee Diagnosing Phys: Jodelle Red MD PROCEDURE: After discussion of the risks and benefits of a TEE, an informed consent was obtained from the patient. The transesophogeal probe was passed without difficulty through the esophogus of the patient. Imaged were obtained with the patient in a left lateral decubitus position. Sedation performed by different physician. The patient was monitored while under deep sedation. Anesthestetic sedation was provided intravenously by Anesthesiology: 354mg  of Propofol, 100mg  of Lidocaine. Image quality was  good. The patient's vital signs; including heart rate, blood pressure, and oxygen saturation; remained stable throughout the procedure. The patient developed no complications during the procedure.  IMPRESSIONS  1. Left ventricular ejection fraction, by estimation, is 40 to 45%. The left ventricle has mildly decreased function. The left ventricle demonstrates global hypokinesis.  2. Right ventricular systolic  function is low normal. The right ventricular size is mildly enlarged. There is moderately elevated pulmonary artery systolic pressure. The estimated right ventricular systolic pressure is 50.0 mmHg.  3. Left atrial size was severely dilated. No left atrial/left atrial appendage thrombus was detected. The LAA emptying velocity was 17 cm/s.  4. Right atrial size was moderately dilated.  5. The mitral valve is normal in structure. Mild to moderate mitral valve regurgitation. No evidence of mitral stenosis.  6. Tricuspid valve regurgitation is moderate.  7. The aortic valve has been repaired/replaced. There is mild calcification of the aortic valve. There is mild thickening of the aortic valve. Aortic valve regurgitation is not visualized. There is a 23 mm Edwards bioprosthetic valve present in the aortic position. Procedure Date: 01/24/12.  8. S/P ascending aorta repair 01/25/2012 with 28 mm Bentall conduit. No evidence of abscess. Aortic root/ascending aorta has been repaired/replaced.  9. 3D performed of the aortic valve and demonstrates No evidence of aortic valve endocarditis. Conclusion(s)/Recommendation(s): No evidence of vegetation/infective endocarditis on this transesophageael echocardiogram. FINDINGS  Left Ventricle: Left ventricular ejection fraction, by estimation, is 40 to 45%. The left ventricle has mildly decreased function. The left ventricle demonstrates global hypokinesis. The left ventricular internal cavity size was normal in size. Right Ventricle: The right ventricular size is mildly enlarged. Right vetricular wall thickness was not well visualized. Right ventricular systolic function is low normal. There is moderately  elevated pulmonary artery systolic pressure. The tricuspid regurgitant velocity is 3.24 m/s, and with an assumed right atrial pressure of 8 mmHg, the estimated right ventricular systolic pressure is 50.0 mmHg. Left Atrium: Left atrial size was severely dilated. No left atrial/left  atrial appendage thrombus was detected. The LAA emptying velocity was 17 cm/s. Right Atrium: Right atrial size was moderately dilated. Pericardium: Trivial pericardial effusion is present. Mitral Valve: The mitral valve is normal in structure. Mild to moderate mitral valve regurgitation. No evidence of mitral valve stenosis. There is no evidence of mitral valve vegetation. Tricuspid Valve: The tricuspid valve is normal in structure. Tricuspid valve regurgitation is moderate . No evidence of tricuspid stenosis. There is no evidence of tricuspid valve vegetation. Aortic Valve: The aortic valve has been repaired/replaced. There is mild calcification of the aortic valve. There is mild thickening of the aortic valve. Aortic valve regurgitation is not visualized. There is a 23 mm Edwards bioprosthetic valve present in the aortic position. Procedure Date: 01/24/12. There is no evidence of aortic valve vegetation. Pulmonic Valve: The pulmonic valve was grossly normal. Pulmonic valve regurgitation is trivial. No evidence of pulmonic stenosis. There is no evidence of pulmonic valve vegetation. Aorta: S/P ascending aorta repair 01/25/2012 with 28 mm Bentall conduit. No evidence of abscess. The aortic root/ascending aorta has been repaired/replaced. IAS/Shunts: No atrial level shunt detected by color flow Doppler. Additional Comments: Spectral Doppler performed. LEFT VENTRICLE PLAX 2D LVOT diam:     1.80 cm LVOT Area:     2.54 cm  TRICUSPID VALVE TR Peak grad:   42.0 mmHg TR Vmax:        324.00 cm/s  SHUNTS Systemic Diam: 1.80 cm Jodelle Red MD Electronically signed by Jodelle Red MD Signature Date/Time: 12/23/2023/8:19:18 PM    Final    EP STUDY Result Date: 12/23/2023 See surgical note for result.  ECHOCARDIOGRAM COMPLETE Result Date: 12/20/2023    ECHOCARDIOGRAM REPORT   Patient Name:   Douglas [name] Date of Exam: 12/20/2023 Medical Rec #:  [id number]     Height:       66.0 in Accession #:    [id number]     Weight:       200.8 lb Date of Birth:  12-Jan-1928     BSA:          2.003 m Patient Age:    89 years      BP:           150/79 mmHg Patient Gender: M             HR:           79 bpm. Exam Location:  Inpatient Procedure: 2D Echo, Cardiac Doppler, Color Doppler and Intracardiac            Opacification Agent (Both Spectral and Color Flow Doppler were            utilized during procedure). Indications:    Bacteremia  History:        Patient has prior history of Echocardiogram examinations, most                 recent 09/10/2023. Risk Factors:Hypertension.                 Aortic Valve: 23 mm Edwards valve is present in the aortic                 position.  Sonographer:    Karma Ganja Referring Phys: [id number] CLANFORD L  JOHNSON  Sonographer Comments: Technically difficult study due to poor echo windows. IMPRESSIONS  1. Left ventricular ejection fraction, by estimation, is 40%. The left ventricle has mildly decreased function. Left ventricular endocardial border not optimally defined to evaluate regional wall motion. There is moderate left ventricular hypertrophy. Left ventricular diastolic parameters are indeterminate. There is the interventricular septum is flattened in systole and diastole, consistent with right ventricular pressure and volume overload.  2. Right ventricular systolic function was not well visualized. The right ventricular size is not well visualized. There is moderately elevated pulmonary artery systolic pressure.  3. The mitral valve was not well visualized. No evidence of mitral valve regurgitation. No evidence of mitral stenosis.  4. The tricuspid valve is abnormal.  5. 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 The aortic valve has been repaired/replaced. Aortic valve regurgitation is not visualized. No aortic stenosis is present. There is a 23 mm Edwards valve present in the aortic position.  6. The pulmonic valve  was abnormal.  7. The inferior vena cava is normal in size with greater than 50% respiratory variability, suggesting right atrial pressure of 3 mmHg. FINDINGS  Left Ventricle: Left ventricular ejection fraction, by estimation, is 40%. The left ventricle has mildly decreased function. Left ventricular endocardial border not optimally defined to evaluate regional wall motion. Definity contrast agent was given IV  to delineate the left ventricular endocardial borders. Strain imaging was not performed. The left ventricular internal cavity size was normal in size. There is moderate left ventricular hypertrophy. The interventricular septum is flattened in systole and diastole, consistent with right ventricular pressure and volume overload. Left ventricular diastolic parameters are indeterminate. Right Ventricle: The right ventricular size is not well visualized. Right vetricular wall thickness was not well visualized. Right ventricular systolic function was not well visualized. There is moderately elevated pulmonary artery systolic pressure. The  tricuspid regurgitant velocity is 3.50 m/s, and with an assumed right atrial pressure of 3 mmHg, the estimated right ventricular systolic pressure is 52.0 mmHg. Left Atrium: Left atrial size was not well visualized. Right Atrium: Right atrial size was not well visualized. Pericardium: There is no evidence of pericardial effusion. Mitral Valve: The mitral valve was not well visualized. No evidence of mitral valve regurgitation. No evidence of mitral valve stenosis. Tricuspid Valve: The tricuspid valve is abnormal. Tricuspid valve regurgitation is mild . No evidence of tricuspid stenosis. 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 The aortic valve has been repaired/replaced. Aortic valve regurgitation is not visualized. No aortic stenosis is present. Aortic valve mean gradient measures  1.0 mmHg. Aortic valve peak gradient measures 2.0 mmHg. Aortic valve area, by VTI measures 2.79 cm. There is a 23 mm Edwards valve present in the aortic position. Pulmonic Valve: The pulmonic valve was abnormal. Pulmonic valve regurgitation is mild. No evidence of pulmonic stenosis. Aorta: The aortic root was not well visualized. Venous: The inferior vena cava is normal in size with greater than 50% respiratory variability, suggesting right atrial pressure of 3 mmHg. IAS/Shunts: The interatrial septum was not well visualized. Additional Comments: 3D imaging was not performed. A device lead is visualized in the right atrium and right ventricle.  LEFT VENTRICLE PLAX 2D LVIDd:         4.70 cm      Diastology LVIDs:  3.70 cm      LV e' medial:    8.27 cm/s LV PW:         1.30 cm      LV E/e' medial:  15.1 LV IVS:        1.30 cm      LV e' lateral:   7.83 cm/s LVOT diam:     1.90 cm      LV E/e' lateral: 16.0 LV SV:         33 LV SV Index:   17 LVOT Area:     2.84 cm  LV Volumes (MOD) LV vol d, MOD A2C: 87.5 ml LV vol d, MOD A4C: 114.0 ml LV vol s, MOD A2C: 44.2 ml LV vol s, MOD A4C: 67.6 ml LV SV MOD A2C:     43.3 ml LV SV MOD A4C:     114.0 ml LV SV MOD BP:      46.3 ml RIGHT VENTRICLE            IVC RV Basal diam:  4.50 cm    IVC diam: 1.60 cm RV S prime:     5.87 cm/s TAPSE (M-mode): 1.6 cm LEFT ATRIUM              Index        RIGHT ATRIUM           Index LA diam:        6.10 cm  3.05 cm/m   RA Area:     25.00 cm LA Vol (A2C):   83.6 ml  41.73 ml/m  RA Volume:   85.70 ml  42.78 ml/m LA Vol (A4C):   118.0 ml 58.91 ml/m LA Biplane Vol: 103.0 ml 51.42 ml/m  AORTIC VALVE AV Area (Vmax):    2.71 cm AV Area (Vmean):   2.62 cm AV Area (VTI):     2.79 cm AV Vmax:           70.60 cm/s AV Vmean:          46.200 cm/s AV VTI:            0.119 m AV Peak Grad:      2.0 mmHg AV Mean Grad:      1.0 mmHg LVOT Vmax:         67.40 cm/s LVOT Vmean:        42.700 cm/s LVOT VTI:          0.117 m LVOT/AV VTI ratio: 0.98  MITRAL VALVE                TRICUSPID VALVE MV Area (PHT): 4.10 cm     TR Peak grad:   49.0 mmHg MV Decel Time: 185 msec     TR Vmax:        350.00 cm/s MV E velocity: 125.00 cm/s MV A velocity: 34.00 cm/s   SHUNTS MV E/A ratio:  3.68         Systemic VTI:  0.12 m                             Systemic Diam: 1.90 cm Dina Rich MD Electronically signed by Dina Rich MD Signature Date/Time: 12/20/2023/3:32:16 PM    Final    DG CHEST PORT 1 VIEW Result Date: 12/19/2023 CLINICAL DATA:  Cough. EXAM: PORTABLE CHEST 1 VIEW COMPARISON:  Chest radiograph dated 12/19/2023 at 5:34 a.m. FINDINGS: Patient is rotated to  the left. Stable mild cardiomegaly. Prior median sternotomy and aortic valve repair. Stable left chest wall pacemaker in place. Similar retrocardiac opacity and mild patchy right basilar opacities. No pneumothorax identified. No acute osseous abnormality. IMPRESSION: 1. Similar retrocardiac and mild patchy right basilar opacities could reflect atelectasis or infiltrate. 2. Similar blunting of the left costophrenic angle could reflect scarring or a small effusion. Electronically Signed   By: Hart Robinsons M.D.   On: 12/19/2023 11:49   DG Chest Portable 1 View Result Date: 12/19/2023 CLINICAL DATA:  Persistent cough EXAM: PORTABLE CHEST 1 VIEW COMPARISON:  08/29/2023 FINDINGS: Unchanged left chest wall pacer. Previous median sternotomy and aortic valve repair. Aortic atherosclerotic calcifications. Stable cardiomediastinal contours. Retrocardiac opacity with indistinct left hemidiaphragm is identified. Blunting of the left costophrenic angle. Mild patchy opacities identified in the right lower lung. Visualized osseous structures are unremarkable. IMPRESSION: 1. Retrocardiac opacity with indistinct left hemidiaphragm. Findings may reflect atelectasis and/or pneumonia. 2. Mild patchy opacities in the right lower lung. 3. Blunting of left costophrenic angle which may reflect chronic pleuroparenchymal  scarring versus small effusion. Electronically Signed   By: Signa Kell M.D.   On: 12/19/2023 06:12   Micro Results   Recent Results (from the past 240 hours)  Culture, blood (Routine x 2)     Status: None   Collection Time: 01/10/24  9:36 AM   Specimen: BLOOD  Result Value Ref Range Status   Specimen Description BLOOD LEFT ASSIST CONTROL  Final   Special Requests   Final    BOTTLES DRAWN AEROBIC AND ANAEROBIC Blood Culture results may not be optimal due to an inadequate volume of blood received in culture bottles   Culture   Final    NO GROWTH 5 DAYS Performed at [facility], [address] [place], [place] [address]    Report Status 01/15/2024 FINAL  Final  Urine Culture     Status: None   Collection Time: 01/10/24  9:36 AM   Specimen: Urine, Random  Result Value Ref Range Status   Specimen Description   Final    URINE, RANDOM Performed at [facility], [address] [place], [place] [address]    Special Requests   Final    NONE Reflexed from [id number] Performed at [facility], [address] [place], [place] [address]    Culture   Final    NO GROWTH Performed at [facility], [address] [place], [place] [address]    Report Status 01/11/2024 FINAL  Final  Culture, blood (Routine x 2)     Status: None   Collection Time: 01/10/24  9:41 AM   Specimen: BLOOD  Result Value Ref Range Status   Specimen Description BLOOD RIGHT ASSIST CONTROL  Final   Special Requests   Final    BOTTLES DRAWN AEROBIC AND ANAEROBIC Blood Culture adequate volume   Culture   Final    NO GROWTH 5 DAYS Performed at [facility], [address] [place], [place] [address]    Report Status 01/15/2024 FINAL  Final  Gastrointestinal Panel by PCR , Stool     Status: None   Collection Time: 01/10/24 10:00 AM   Specimen: Stool  Result Value Ref Range Status   Campylobacter species NOT DETECTED NOT DETECTED Final   Plesimonas shigelloides NOT DETECTED NOT DETECTED Final   Salmonella  species NOT DETECTED NOT DETECTED Final   Yersinia enterocolitica NOT DETECTED NOT DETECTED Final   Vibrio species NOT DETECTED NOT DETECTED Final  Vibrio cholerae NOT DETECTED NOT DETECTED Final   Enteroaggregative E coli (EAEC) NOT DETECTED NOT DETECTED Final   Enteropathogenic E coli (EPEC) NOT DETECTED NOT DETECTED Final   Enterotoxigenic E coli (ETEC) NOT DETECTED NOT DETECTED Final   Shiga like toxin producing E coli (STEC) NOT DETECTED NOT DETECTED Final   Shigella/Enteroinvasive E coli (EIEC) NOT DETECTED NOT DETECTED Final   Cryptosporidium NOT DETECTED NOT DETECTED Final   Cyclospora cayetanensis NOT DETECTED NOT DETECTED Final   Entamoeba histolytica NOT DETECTED NOT DETECTED Final   Giardia lamblia NOT DETECTED NOT DETECTED Final   Adenovirus F40/41 NOT DETECTED NOT DETECTED Final   Astrovirus NOT DETECTED NOT DETECTED Final   Norovirus GI/GII NOT DETECTED NOT DETECTED Final   Rotavirus A NOT DETECTED NOT DETECTED Final   Sapovirus (I, II, IV, and V) NOT DETECTED NOT DETECTED Final    Comment: Performed at [facility], [address] [place], [place] [address]  C Difficile Quick Screen w PCR reflex     Status: None   Collection Time: 01/10/24 10:00 AM   Specimen: Stool  Result Value Ref Range Status   C Diff antigen NEGATIVE NEGATIVE Final   C Diff toxin NEGATIVE NEGATIVE Final   C Diff interpretation No C. difficile detected.  Final    Comment: Performed at [facility], [address] [place], [place] [address]  MRSA Next Gen by PCR, Nasal     Status: None   Collection Time: 01/10/24  6:23 PM   Specimen: Nasal Mucosa; Nasal Swab  Result Value Ref Range Status   MRSA by PCR Next Gen NOT DETECTED NOT DETECTED Final    Comment: (NOTE) The GeneXpert MRSA Assay (FDA approved for NASAL specimens only), is one component of a comprehensive MRSA colonization surveillance program. It is not intended to diagnose MRSA infection nor to guide or monitor treatment for  MRSA infections. Test performance is not FDA approved in patients less than 86 years old. Performed at [facility], [address], [place], [place] [address]   Culture, blood (Routine X 2) w Reflex to ID Panel     Status: None (Preliminary result)   Collection Time: 01/13/24  4:34 PM   Specimen: BLOOD LEFT HAND  Result Value Ref Range Status   Specimen Description   Final    BLOOD LEFT HAND BOTTLES DRAWN AEROBIC AND ANAEROBIC   Special Requests Blood Culture adequate volume  Final   Culture   Final    NO GROWTH 3 DAYS Performed at [facility], [address], [place], [place] [address]    Report Status PENDING  Incomplete  Culture, blood (Routine X 2) w Reflex to ID Panel     Status: None (Preliminary result)   Collection Time: 01/13/24  4:34 PM   Specimen: BLOOD LEFT HAND  Result Value Ref Range Status   Specimen Description BLOOD LEFT HAND AEROBIC BOTTLE ONLY  Final   Special Requests   Final    Blood Culture results may not be optimal due to an inadequate volume of blood received in culture bottles   Culture   Final    NO GROWTH 3 DAYS Performed at [facility], [address], [place], [place] [address]    Report Status PENDING  Incomplete   Today   Subjective    Douglas [name] today has no new complaints -Volume, consistency and frequency of stools have improved   - Patient's cousin Natalia Leatherwood is at bedside, questions answered      -  No fever  Or chills  -No nausea or vomiting -Oral intake is fair --voiding okay   Patient has been seen and examined prior to discharge   Objective   Blood pressure (!) 120/46, pulse 70, temperature 98 F (36.7 C), temperature source Oral, resp. rate 20, height 5\' 5"  (1.651 m), weight 84.6 kg, SpO2 100%.   Intake/Output Summary (Last 24 hours) at 01/16/2024 1348 Last data filed at 01/16/2024 1300 Gross per 24 hour  Intake 972.46 ml  Output 1100 ml  Net -127.54 ml   Exam Gen:- Awake Alert, no acute distress  HEENT:- [place].AT, No  sclera icterus Neck-Supple Neck,No JVD,.  Lungs-  CTAB , good air movement bilaterally CV- S1, S2 normal, regular, 2/6 SM, prior sternotomy scar, pacemaker in situ  Abd-  +ve B.Sounds, Abd Soft, No tenderness,    Extremity/Skin:- +ve  edema (TED stockings Placed),   good pulses Psych-affect is appropriate, oriented x3 Neuro-generalized weakness, no new focal deficits, no tremors    Data Review   CBC w Diff:  Lab Results  Component Value Date   WBC 11.0 (H) 01/15/2024   HGB 10.3 (L) 01/15/2024   HGB 12.2 (L) 08/29/2023   HCT 31.9 (L) 01/15/2024   HCT 37.7 08/29/2023   PLT 49 (L) 01/15/2024   PLT 195 08/29/2023   LYMPHOPCT 10 01/13/2024   MONOPCT 17 01/13/2024   EOSPCT 9 01/13/2024   BASOPCT 0 01/13/2024   CMP:  Lab Results  Component Value Date   NA 140 01/16/2024   NA 143 08/29/2023   K 3.7 01/16/2024   CL 119 (H) 01/16/2024   CO2 16 (L) 01/16/2024   BUN 42 (H) 01/16/2024   BUN 27 08/29/2023   CREATININE 1.64 (H) 01/16/2024   CREATININE 1.60 (H) 02/10/2013   PROT 5.9 (L) 01/10/2024   PROT 7.1 08/29/2023   ALBUMIN 2.1 (L) 01/16/2024   ALBUMIN 4.1 08/29/2023   BILITOT 0.6 01/10/2024   BILITOT 0.6 08/29/2023   ALKPHOS 100 01/10/2024   AST 14 (L) 01/10/2024   ALT 10 01/10/2024  .  Total Discharge time is about 33 minutes  Shon Hale M.D on 01/16/2024 at 1:48 PM  Go to [email or web address] -  for contact info  [facility] - Office  [phone]

## 2024-01-17 ENCOUNTER — Other Ambulatory Visit: Payer: Self-pay

## 2024-01-17 ENCOUNTER — Encounter (HOSPITAL_COMMUNITY)
Admission: RE | Admit: 2024-01-17 | Discharge: 2024-01-17 | Disposition: A | Discharge status: Home / Self Care | Source: Skilled Nursing Facility | Attending: Adult Health | Admitting: Adult Health

## 2024-01-17 ENCOUNTER — Encounter: Payer: Self-pay | Admitting: Internal Medicine

## 2024-01-17 ENCOUNTER — Non-Acute Institutional Stay (SKILLED_NURSING_FACILITY): Payer: Self-pay | Admitting: Internal Medicine

## 2024-01-17 DIAGNOSIS — R9389 Abnormal findings on diagnostic imaging of other specified body structures: Secondary | ICD-10-CM | POA: Insufficient documentation

## 2024-01-17 DIAGNOSIS — R197 Diarrhea, unspecified: Secondary | ICD-10-CM

## 2024-01-17 DIAGNOSIS — N179 Acute kidney failure, unspecified: Secondary | ICD-10-CM | POA: Diagnosis not present

## 2024-01-17 DIAGNOSIS — E43 Unspecified severe protein-calorie malnutrition: Secondary | ICD-10-CM | POA: Diagnosis not present

## 2024-01-17 DIAGNOSIS — R531 Weakness: Secondary | ICD-10-CM

## 2024-01-17 DIAGNOSIS — I480 Paroxysmal atrial fibrillation: Secondary | ICD-10-CM | POA: Insufficient documentation

## 2024-01-17 DIAGNOSIS — R1312 Dysphagia, oropharyngeal phase: Secondary | ICD-10-CM | POA: Diagnosis not present

## 2024-01-17 LAB — PROTIME-INR
INR: 2.6 — ABNORMAL HIGH (ref 0.8–1.2)
Prothrombin Time: 27.8 s — ABNORMAL HIGH (ref 11.4–15.2)

## 2024-01-17 LAB — URIC ACID: Uric Acid, Serum: 4.5 mg/dL (ref 3.7–8.6)

## 2024-01-17 NOTE — Assessment & Plan Note (Signed)
 Rales present left lower lobe.  Incentive spirometry will be ordered.

## 2024-01-17 NOTE — Assessment & Plan Note (Addendum)
 PT/OT at SNF to improve muscle tone and strength.  Generalized weakness is a result of protracted diarrhea resulting in dehydration associated with hypotension & AKI. This was superimposed on multiple advanced co-morbidities.

## 2024-01-17 NOTE — Assessment & Plan Note (Addendum)
 AKI in the context of diarrhea resulting dehydration and hypotension.  Nadir GFR was 12 with repeat creatinine 4.33.  With rehydration creatinine 1.64 and GFR 39 indicating CKD stage IIIb.  His baseline GFR is 49 to > 60. As his uric acid is 4.5 (3.7-8.6) the allopurinol will be decreased to 150 mg daily & BMET monitored.

## 2024-01-17 NOTE — Progress Notes (Signed)
 NURSING HOME LOCATION:  [facility] ROOM NUMBER: [id number]  CODE STATUS: DNR  PCP: Carylon Perches, MD  This is a comprehensive admission note to this SNFperformed on this date less than 30 days from date of admission. Included are preadmission medical/surgical history; reconciled medication list; family history; social history and comprehensive review of systems.  Corrections and additions to the records were documented. Comprehensive physical exam was also performed. Additionally a clinical summary was entered for each active diagnosis pertinent to this admission in the Problem List to enhance continuity of care.  HPI: He was hospitalized 3/21 - 01/16/2024 with probable systemic inflammatory response syndrome and AKI in the context of possible sepsis. He had recently been admitted to the hospital due to bacteremia and sepsis in the context of community-acquired pneumonia.  He was discharged home on 3/4.  By history since that day has had several episodes of watery, nonbloody diarrhea daily for approximately 3 weeks.  Antidiarrheal agents were prescribed by his PCP with no benefit.  In the ED he was hypothermic.  Sodium level was 128 and creatinine was 4.33; baseline was felt to be approximately 1.1.  Initial lactic acid level was 2.8.  This did drop to 1.6 after fluid resuscitation. PT/ INR was 9.8 in the context of warfarin therapy because of history of aortic valve replacement.  Relative hypotension was present with systolics dropping into the 80s and 90s.  Neo-Synephrine was required and could not be weaned until 3/22.   CT of the abdomen revealed air-fluid levels in the colon without colitis, diverticulitis, or ileus.  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  C. difficile screen was negative.  Imodium was initiated as needed.  Blood  and urine cultures were negative allowing discontinuation of IV Rocephin and  Flagyl after 3/22.  Probiotic was continued. Sodium normalized with hydration.  Fasting cortisol was normal. The AKI improved with hydration and blood pressure stabilization with subsequent creatinine of 1.64. His initial GFR was 12; final value was 39 indicating CKD stage IIIb.  Serially his baseline GFR ranges from 49 to over 60 indicating stage IIIa-stage II CKD. Bicarb corrected from 7 to 14 with supplementation. Protein /caloric malnutrition documented with albumin of 2.1 & total protein of 5.9. Prior to discharge H/H values were 10.3/31.9.  The initial values of 13.2/41.7 were felt to be artificially elevated due to dehydration. No bleeding dyscrasias documented.Initial platelet count was 117,000; final value was 49,000.  INR improved with vitamin K.  Warfarin was to be restarted 3/29 as long as oral intake was deemed adequate. PT/INR was to be repeated 03/31. Clinically it was felt that the patient may not have had frank sepsis and that the hypotension may have been related to volume depletion in the setting of severe diarrhea for several weeks with hypovolemia and metabolic acidosis. He was discharged to the SNF for rehab.  Past medical and surgical history: Includes past history of acute upper GI bleed, history of thoracic aortic aneurysm, history of aortic stenosis, atrial fibrillation, CAD, DJD, essential hypertension, fibromyalgia, history of gout, history of esophageal ulcer, history of adenomatous colon polyps, PMR, and history of complete heart block. Surgeries and procedures include aortic valve replacement, CABG, EGD, colonoscopy with polypectomy, pacemaker placement, and bilateral knee arthroscopy.  Family history: reviewed, non contributory due to advanced age.  Social history: non drinker; non smoker.   Review of systems: He has a vague idea of what hospital diagnoses were.Marland KitchenHe  stated "I had pneumonia to start with an infection in blood. Instead it did involve the pacemaker and  valve.  They put me on high-powered antibiotics & caused diarrhea."  He was unaware of the AKI.  He validates dysphagia symptoms resulting in the nonproductive cough.  He describes fatigue if he stays in bed for long period of time.  He denies any paroxysmal nocturnal dyspnea symptoms.  Constitutional: No fever, significant weight change Eyes: No redness, discharge, pain, vision change ENT/mouth: No nasal congestion, purulent discharge, earache, change in hearing, sore throat  Cardiovascular: No chest pain, palpitations, paroxysmal nocturnal dyspnea, claudication, edema  Respiratory: No  sputum production, hemoptysis, DOE, significant snoring, apnea  Gastrointestinal: No heartburn,  abdominal pain, nausea /vomiting, rectal bleeding, melena, new change in bowels Genitourinary: No dysuria, hematuria, pyuria, incontinence, nocturia Musculoskeletal: No joint stiffness, joint swelling, weakness, pain Dermatologic: No rash, pruritus, change in appearance of skin Neurologic: No dizziness, headache, syncope, seizures, numbness, tingling Psychiatric: No significant anxiety, depression, insomnia, anorexia Endocrine: No change in hair/skin/nails, excessive thirst, excessive hunger, excessive urination  Hematologic/lymphatic: No significant bruising, lymphadenopathy, abnormal bleeding Allergy/immunology: No itchy/watery eyes, significant sneezing, urticaria, angioedema  Physical exam:  Pertinent or positive findings: He appears younger than his stated age but somewhat chronically ill.  There is slight ptosis and exotropia of the right eye.  Responses are slow but focused.  He has a grade 1/2 systolic murmur at the left sternal border.  The second heart sound is increased.  He has dry rales at the left lower lobe.  Abdomen is protuberant. Pedal pulses are not palpable.  There is visible edema of the lower extremities despite compression hose.  There is also edema of the dorsum of the hands.  There is resolving  ecchymosis of the dorsum of the hands.  General appearance:  no acute distress, increased work of breathing is present.   Lymphatic: No lymphadenopathy about the head, neck, axilla. Eyes: No conjunctival inflammation or lid edema is present. There is no scleral icterus. Ears:  External ear exam shows no significant lesions or deformities.   Nose:  External nasal examination shows no deformity or inflammation. Nasal mucosa are pink and moist without lesions, exudates Oral exam: Lips and gums are healthy appearing.There is no oropharyngeal erythema or exudate. Neck:  No thyromegaly, masses, tenderness noted.    Heart:  Normal rate and regular rhythm without gallop, click, rub.  Lungs:  without wheezes, rhonchi, rubs. Abdomen: Bowel sounds are normal.  Abdomen is soft and nontender with no organomegaly, hernias, masses. GU: Deferred  Extremities:  No cyanosis, clubbing Neurologic exam: Balance, Rhomberg, finger to nose testing could not be completed due to clinical state Skin: Warm & dry w/o tenting. No significant rash.  See clinical summary under each active problem in the Problem List with associated updated therapeutic plan

## 2024-01-17 NOTE — Assessment & Plan Note (Signed)
 C. difficile studies negative.  Probiotic and as needed Imodium initiated.  He validates that the diarrhea has resolved.

## 2024-01-17 NOTE — Patient Instructions (Signed)
 See assessment and plan under each diagnosis in the problem list and acutely for this visit

## 2024-01-17 NOTE — Consult Note (Signed)
 [facility] Liaison Note  01/17/2024  [name] 03-05-1928 [id number]  Location: RN Hospital Liaison screened the patient remotely at [facility].  Insurance: Medicare   Douglas [name] is a 89 y.o. male who is a Primary Care Patient of Carylon Perches, MD The patient was screened for 30 day readmission hospitalization with noted high risk score for unplanned readmission risk with 2 IP/1 ED in 6 months.  The patient was assessed for potential Care Management service needs for post hospital transition for care coordination. Review of patient's electronic medical record reveals patient was admitted for Sepsis. Pt recommended for SNF level of care for ongoing rehabilitation. Liaison will collaborate with PAC-RN to follow up for any additional for community needs post discharge via SNF.  [facility] Care Management/Population Health does not replace or interfere with any arrangements made by the Inpatient Transition of Care team.   For questions contact:   Douglas Cousin, RN, BSN Hospital Liaison [place]   [facility], Population Health Office Hours MTWF  8:00 am-6:00 pm Direct Dial: [phone] mobile [email or web address]

## 2024-01-17 NOTE — Assessment & Plan Note (Addendum)
 He describes dysphagia associated with cough.  Speech Therapy to assess here at the SNF as probable high risk of aspiration.

## 2024-01-18 DIAGNOSIS — E46 Unspecified protein-calorie malnutrition: Secondary | ICD-10-CM | POA: Insufficient documentation

## 2024-01-18 LAB — CULTURE, BLOOD (ROUTINE X 2)
Culture: NO GROWTH
Culture: NO GROWTH
Special Requests: ADEQUATE

## 2024-01-18 NOTE — Assessment & Plan Note (Signed)
 Albumin 2.1 & total protein 5.9. Dysphagia compromising intake.Protein supplement ordered.

## 2024-01-20 ENCOUNTER — Other Ambulatory Visit (HOSPITAL_COMMUNITY)
Admission: RE | Admit: 2024-01-20 | Discharge: 2024-01-20 | Disposition: A | Discharge status: Home / Self Care | Source: Skilled Nursing Facility | Attending: Adult Health | Admitting: Adult Health

## 2024-01-20 ENCOUNTER — Encounter (HOSPITAL_COMMUNITY): Payer: Self-pay | Admitting: Oncology

## 2024-01-20 DIAGNOSIS — E871 Hypo-osmolality and hyponatremia: Secondary | ICD-10-CM | POA: Insufficient documentation

## 2024-01-20 LAB — BASIC METABOLIC PANEL WITH GFR
Anion gap: 8 (ref 5–15)
BUN: 33 mg/dL — ABNORMAL HIGH (ref 8–23)
CO2: 19 mmol/L — ABNORMAL LOW (ref 22–32)
Calcium: 8 mg/dL — ABNORMAL LOW (ref 8.9–10.3)
Chloride: 111 mmol/L (ref 98–111)
Creatinine, Ser: 1.23 mg/dL (ref 0.61–1.24)
GFR, Estimated: 54 mL/min — ABNORMAL LOW (ref >60)
Glucose, Bld: 75 mg/dL (ref 70–99)
Potassium: 4.4 mmol/L (ref 3.5–5.1)
Sodium: 138 mmol/L (ref 135–145)

## 2024-01-20 LAB — CBC
HCT: 29.7 % — ABNORMAL LOW (ref 39.0–52.0)
Hemoglobin: 9.8 g/dL — ABNORMAL LOW (ref 13.0–17.0)
MCH: 29.6 pg (ref 26.0–34.0)
MCHC: 33 g/dL (ref 30.0–36.0)
MCV: 89.7 fL (ref 80.0–100.0)
Platelets: 175 10*3/uL (ref 150–400)
RBC: 3.31 MIL/uL — ABNORMAL LOW (ref 4.22–5.81)
RDW: 16.8 % — ABNORMAL HIGH (ref 11.5–15.5)
WBC: 12.6 10*3/uL — ABNORMAL HIGH (ref 4.0–10.5)
nRBC: 1.1 % — ABNORMAL HIGH (ref 0.0–0.2)

## 2024-01-20 LAB — PROTIME-INR
INR: 1.9 — ABNORMAL HIGH (ref 0.8–1.2)
Prothrombin Time: 22.3 s — ABNORMAL HIGH (ref 11.4–15.2)

## 2024-01-21 ENCOUNTER — Inpatient Hospital Stay: Admitting: Internal Medicine

## 2024-01-21 ENCOUNTER — Telehealth: Payer: Self-pay

## 2024-01-21 DIAGNOSIS — Z23 Encounter for immunization: Secondary | ICD-10-CM | POA: Diagnosis not present

## 2024-01-21 NOTE — Telephone Encounter (Signed)
 As long as the and does blood culture and the doc there asks questions to see how he is doing to make sure he is ok   Sorry answer no. Comes if he is febrile but by then probably ed visit is needed  The doc at snf can determine

## 2024-01-21 NOTE — Telephone Encounter (Signed)
 Mavis with [facility] called regarding pt's appt that daughter called and recently cancelled. She called and asked a little more information on this appt and if there is an urgency for scheduling or need. I did state that this is for his HSFU, but I can forward to the clinical team for further advisement.   She did state that he will be at [facility] for the next 20-25 days.  [employer] [phone]

## 2024-01-22 ENCOUNTER — Other Ambulatory Visit (HOSPITAL_COMMUNITY)
Admission: RE | Admit: 2024-01-22 | Discharge: 2024-01-22 | Disposition: A | Discharge status: Home / Self Care | Source: Skilled Nursing Facility | Attending: Adult Health | Admitting: Adult Health

## 2024-01-22 DIAGNOSIS — A419 Sepsis, unspecified organism: Secondary | ICD-10-CM | POA: Insufficient documentation

## 2024-01-23 NOTE — Telephone Encounter (Signed)
 Left message with Mavis for nurse to call me back.    Harlowe Dowler Lesli Albee, CMA

## 2024-01-23 NOTE — Telephone Encounter (Signed)
 RN returned my call from [facility].   RN stated that blood cultures was done on patient yesterday morning.  RN will talk with provider at SNF to determine if provider can manage patient. If not RN will call back to reschedule appointment.    Douglas [name] Lesli Albee, CMA

## 2024-01-24 ENCOUNTER — Other Ambulatory Visit: Payer: Self-pay | Admitting: *Deleted

## 2024-01-24 NOTE — Telephone Encounter (Signed)
 Spoke to [place], Charity fundraiser at [employer] - informed RN to give it 3 days if nothing grows on culture and patient feels well he is good and doesn't need a follow up at our office.   Erica,RN voiced her understanding.    Michal Strzelecki Lesli Albee, CMA

## 2024-01-24 NOTE — Patient Outreach (Signed)
 Mr. Karaffa resides in [facility].  Screening for potential complex care management services as benefit of health plan and primary care provider.  Collaboration with [facility] social worker to make aware writer is following for potential complex care management needs.     Raiford Noble, MSN, RN, BSN [place]  [facility], [facility] RN Post- Acute Care Manager Direct Dial: [phone]

## 2024-01-25 DIAGNOSIS — N179 Acute kidney failure, unspecified: Secondary | ICD-10-CM | POA: Diagnosis not present

## 2024-01-25 DIAGNOSIS — K219 Gastro-esophageal reflux disease without esophagitis: Secondary | ICD-10-CM | POA: Diagnosis not present

## 2024-01-25 DIAGNOSIS — I442 Atrioventricular block, complete: Secondary | ICD-10-CM | POA: Diagnosis not present

## 2024-01-25 DIAGNOSIS — M797 Fibromyalgia: Secondary | ICD-10-CM | POA: Diagnosis not present

## 2024-01-25 DIAGNOSIS — Z85828 Personal history of other malignant neoplasm of skin: Secondary | ICD-10-CM | POA: Diagnosis not present

## 2024-01-25 DIAGNOSIS — Z951 Presence of aortocoronary bypass graft: Secondary | ICD-10-CM | POA: Diagnosis not present

## 2024-01-25 DIAGNOSIS — E785 Hyperlipidemia, unspecified: Secondary | ICD-10-CM | POA: Diagnosis not present

## 2024-01-25 DIAGNOSIS — R739 Hyperglycemia, unspecified: Secondary | ICD-10-CM | POA: Diagnosis not present

## 2024-01-25 DIAGNOSIS — I2510 Atherosclerotic heart disease of native coronary artery without angina pectoris: Secondary | ICD-10-CM | POA: Diagnosis not present

## 2024-01-25 DIAGNOSIS — M109 Gout, unspecified: Secondary | ICD-10-CM | POA: Diagnosis not present

## 2024-01-25 DIAGNOSIS — D500 Iron deficiency anemia secondary to blood loss (chronic): Secondary | ICD-10-CM | POA: Diagnosis not present

## 2024-01-25 DIAGNOSIS — R1312 Dysphagia, oropharyngeal phase: Secondary | ICD-10-CM | POA: Diagnosis not present

## 2024-01-25 DIAGNOSIS — Z952 Presence of prosthetic heart valve: Secondary | ICD-10-CM | POA: Diagnosis not present

## 2024-01-25 DIAGNOSIS — A4181 Sepsis due to Enterococcus: Secondary | ICD-10-CM | POA: Diagnosis not present

## 2024-01-25 DIAGNOSIS — Z950 Presence of cardiac pacemaker: Secondary | ICD-10-CM | POA: Diagnosis not present

## 2024-01-25 DIAGNOSIS — I480 Paroxysmal atrial fibrillation: Secondary | ICD-10-CM | POA: Diagnosis not present

## 2024-01-25 DIAGNOSIS — J189 Pneumonia, unspecified organism: Secondary | ICD-10-CM | POA: Diagnosis not present

## 2024-01-25 DIAGNOSIS — I110 Hypertensive heart disease with heart failure: Secondary | ICD-10-CM | POA: Diagnosis not present

## 2024-01-25 DIAGNOSIS — Z9181 History of falling: Secondary | ICD-10-CM | POA: Diagnosis not present

## 2024-01-25 DIAGNOSIS — I5021 Acute systolic (congestive) heart failure: Secondary | ICD-10-CM | POA: Diagnosis not present

## 2024-01-25 DIAGNOSIS — M1990 Unspecified osteoarthritis, unspecified site: Secondary | ICD-10-CM | POA: Diagnosis not present

## 2024-01-25 DIAGNOSIS — I071 Rheumatic tricuspid insufficiency: Secondary | ICD-10-CM | POA: Diagnosis not present

## 2024-01-27 ENCOUNTER — Other Ambulatory Visit: Payer: Self-pay | Admitting: *Deleted

## 2024-01-27 ENCOUNTER — Non-Acute Institutional Stay (SKILLED_NURSING_FACILITY): Payer: Self-pay | Admitting: Adult Health

## 2024-01-27 ENCOUNTER — Other Ambulatory Visit: Payer: Self-pay | Admitting: Adult Health

## 2024-01-27 ENCOUNTER — Other Ambulatory Visit (HOSPITAL_COMMUNITY)
Admission: RE | Admit: 2024-01-27 | Discharge: 2024-01-27 | Disposition: A | Discharge status: Home / Self Care | Source: Skilled Nursing Facility | Attending: Adult Health | Admitting: Adult Health

## 2024-01-27 ENCOUNTER — Encounter: Payer: Self-pay | Admitting: Adult Health

## 2024-01-27 DIAGNOSIS — R6510 Systemic inflammatory response syndrome (SIRS) of non-infectious origin without acute organ dysfunction: Secondary | ICD-10-CM

## 2024-01-27 DIAGNOSIS — N179 Acute kidney failure, unspecified: Secondary | ICD-10-CM

## 2024-01-27 DIAGNOSIS — I4811 Longstanding persistent atrial fibrillation: Secondary | ICD-10-CM

## 2024-01-27 DIAGNOSIS — Z952 Presence of prosthetic heart valve: Secondary | ICD-10-CM | POA: Insufficient documentation

## 2024-01-27 DIAGNOSIS — I5021 Acute systolic (congestive) heart failure: Secondary | ICD-10-CM

## 2024-01-27 LAB — PROTIME-INR
INR: 2.9 — ABNORMAL HIGH (ref 0.8–1.2)
Prothrombin Time: 30.4 s — ABNORMAL HIGH (ref 11.4–15.2)

## 2024-01-27 LAB — CULTURE, BLOOD (SINGLE)
Culture: NO GROWTH
Special Requests: ADEQUATE

## 2024-01-27 MED ORDER — PANCRELIPASE (LIP-PROT-AMYL) 12000-38000 UNITS PO CPEP: 24000.0000 [IU] | ORAL_CAPSULE | Freq: Three times a day (TID) | ORAL | 0 refills | Status: AC

## 2024-01-27 MED ORDER — DIPHENOXYLATE-ATROPINE 2.5-0.025 MG PO TABS: 1.0000 | ORAL_TABLET | Freq: Four times a day (QID) | ORAL | 0 refills | Status: AC | PRN

## 2024-01-27 MED ORDER — PANTOPRAZOLE SODIUM 40 MG PO TBEC: 40.0000 mg | DELAYED_RELEASE_TABLET | Freq: Every day | ORAL | 0 refills | Status: DC

## 2024-01-27 MED ORDER — METOPROLOL SUCCINATE ER 25 MG PO TB24: 25.0000 mg | ORAL_TABLET | Freq: Every day | ORAL | 0 refills | Status: DC

## 2024-01-27 MED ORDER — SODIUM BICARBONATE 650 MG PO TABS: 650.0000 mg | ORAL_TABLET | Freq: Two times a day (BID) | ORAL | 0 refills | Status: AC

## 2024-01-27 MED ORDER — FUROSEMIDE 20 MG PO TABS: 20.0000 mg | ORAL_TABLET | ORAL | 0 refills | Status: AC

## 2024-01-27 MED ORDER — ALLOPURINOL 300 MG PO TABS: 300.0000 mg | ORAL_TABLET | Freq: Every day | ORAL | 0 refills | Status: AC

## 2024-01-27 MED ORDER — PRAVASTATIN SODIUM 20 MG PO TABS: 20.0000 mg | ORAL_TABLET | Freq: Every evening | ORAL | 0 refills | Status: AC

## 2024-01-27 MED ORDER — DICLOFENAC SODIUM 1 % EX GEL
2.0000 g | Freq: Four times a day (QID) | CUTANEOUS | 0 refills | Status: DC
Start: 2024-01-27 — End: 2024-07-01

## 2024-01-27 MED ORDER — WARFARIN SODIUM 5 MG PO TABS: 2.5000 mg | ORAL_TABLET | Freq: Every evening | ORAL | 0 refills | Status: DC

## 2024-01-27 NOTE — Progress Notes (Unsigned)
 Location:  [facility] Room Number: 149 Place of Service:  SNF (31)   CODE STATUS: dnr   Allergies  Allergen Reactions   Imdur [Isosorbide Nitrate] Other (See Comments)    Nausea and lightheaded     Chief Complaint  Patient presents with   Discharge Note    HPI:  He is being discharged to home with home health for pt/ot. He does not need any dme. He will need his prescriptions written and will need to follow up with her medical provider. He had been hospitalized for SIRS; sepsis due to pneumonia. He was admitted to this facility for short term rehab. Therapy: ambulate 40 feet with rolling walker and contact guard assist; upper body supervision; lower body min assist; transfer with contact guard assist; bpr min assist.   Past Medical History:  Diagnosis Date   Acute upper GI bleed 05/18/2016   Aneurysm of thoracic aorta (HCC)    Ascending. 5.7 x 5.5cm - Bentall procedure   Aortic stenosis    Bioprosthetic AVR   Arthritis    Atrial fibrillation (HCC)    Documented on pacer interrogation 6/13   Complete heart block (HCC)    Status post pacemaker placement   Coronary atherosclerosis of native coronary artery    Multivessel - LIMA to LAD, SVG to OM, SVG to PDA   DJD (degenerative joint disease)    Esophageal dysmotility    Essential hypertension    Fibromyalgia    Gout    H/O arteriovenous malformation (AVM)    H/O hiatal hernia    Hemorrhoid    History of esophageal ulcer    History of skin cancer    Hx of adenomatous colonic polyps    Iron deficiency anemia due to chronic blood loss    Melena    Pacemaker-Medtronic    PMR (polymyalgia rheumatica) (HCC)    SIRS (systemic inflammatory response syndrome) (HCC) 01/16/2015    Past Surgical History:  Procedure Laterality Date   AORTIC VALVE REPLACEMENT  01/24/12   APPENDECTOMY     BACK SURGERY     1965 or 1966 lumbar back surgery   BENTALL PROCEDURE  01/24/2012   Procedure: BENTALL PROCEDURE;   Surgeon: Kerin Perna, MD;  Location: [facility] OR;  Service: Open Heart Surgery;  Laterality: N/A;   BILATERAL KNEE ARTHROSCOPY     CATARACT EXTRACTION W/PHACO Left 12/03/2016   Procedure: CATARACT EXTRACTION PHACO AND INTRAOCULAR LENS PLACEMENT LEFT EYE;  Surgeon: Gemma Payor, MD;  Location: [facility] ORS;  Service: Ophthalmology;  Laterality: Left;  CDE:  10.87   CATARACT EXTRACTION W/PHACO Right 12/13/2016   Procedure: CATARACT EXTRACTION PHACO AND INTRAOCULAR LENS PLACEMENT (IOC);  Surgeon: Gemma Payor, MD;  Location: [facility] ORS;  Service: Ophthalmology;  Laterality: Right;  right cde 11.78   CORONARY ARTERY BYPASS GRAFT  01/24/2012   Procedure: CORONARY ARTERY BYPASS GRAFTING (CABG);  Surgeon: Kerin Perna, MD;  Location: [facility] OR;  Service: Open Heart Surgery;  Laterality: N/A;   ESOPHAGOGASTRODUODENOSCOPY  06/17/2012   Procedure: ESOPHAGOGASTRODUODENOSCOPY (EGD);  Surgeon: Malissa Hippo, MD;  Location[facility] END[facility];  Service: Endoscopy;  Laterality: N/A;  ED   ESOPHAGOGASTRODUODENOSCOPY N/A 05/19/2016   Procedure: ESOPHAGOGASTRODUODENOSCOPY (EGD);  Surgeon: West Bali, MD;  [facility];  Service: Endoscopy;  Laterality: N/A;   HEMORROIDECTOMY     HERNIA REPAIR     Left inguinal hernia repair     PACEMAKER PLACEMENT     Medtronic 4/13 -  Dr. Johney Frame   PERMANENT PACEMAKER INSERTION N/A 02/01/2012   Procedure: PERMANENT PACEMAKER INSERTION;  Surgeon: Hillis Range, MD; Medtronic Adapta L model ADDRL 1 (serial number [id number])     TRANSESOPHAGEAL ECHOCARDIOGRAM (CATH LAB) N/A 12/23/2023   Procedure: TRANSESOPHAGEAL ECHOCARDIOGRAM;  Surgeon: Jodelle Red, MD;  Location: [facility] INVASIVE CV LAB;  Service: Cardiovascular;  Laterality: N/A;    Social History   Socioeconomic History   Marital status: Married    Spouse name: Not on file   Number of children: 4   Years of education: Not on file   Highest education level: Not on file  Occupational History   Occupation: Retired    Comment:  Engineer, maintenance  Tobacco Use   Smoking status: Never   Smokeless tobacco: Never  Vaping Use   Vaping status: Never Used  Substance and Sexual Activity   Alcohol use: No    Alcohol/week: 0.0 standard drinks of alcohol   Drug use: No   Sexual activity: Never    Birth control/protection: None  Other Topics Concern   Not on file  Social History Narrative   Lives with wife, has been married since 48.   Social Drivers of Corporate investment banker Strain: Not on file  Food Insecurity: No Food Insecurity (01/10/2024)   Hunger Vital Sign    Worried About Running Out of Food in the Last Year: Never true    Ran Out of Food in the Last Year: Never true  Transportation Needs: No Transportation Needs (01/10/2024)   PRAPARE - Administrator, Civil Service (Medical): No    Lack of Transportation (Non-Medical): No  Physical Activity: Not on file  Stress: Not on file  Social Connections: Moderately Isolated (01/10/2024)   Social Connection and Isolation Panel [NHANES]    Frequency of Communication with Friends and Family: Never    Frequency of Social Gatherings with Friends and Family: Once a week    Attends Religious Services: 1 to 4 times per year    Active Member of [employer] or Organizations: No    Attends Banker Meetings: Never    Marital Status: Married  Catering manager Violence: Not At Risk (01/10/2024)   Humiliation, Afraid, Rape, and Kick questionnaire    Fear of Current or Ex-Partner: No    Emotionally Abused: No    Physically Abused: No    Sexually Abused: No   Family History  Problem Relation Age of Onset   Kidney disease Mother    Heart disease Father    Anesthesia problems Neg Hx    Hypotension Neg Hx    Malignant hyperthermia Neg Hx    Pseudochol deficiency Neg Hx       VITAL SIGNS BP 132/80   Pulse 70   Temp (!) 97 F (36.1 C)   Resp 20   Ht 5\' 5"  (1.651 m)   Wt 203 lb 8 oz (92.3 kg)   SpO2 96%   BMI 33.86 kg/m   Outpatient  Encounter Medications as of 01/27/2024  Medication Sig   acetaminophen (TYLENOL) 325 MG tablet Take 2 tablets (650 mg total) by mouth every 6 (six) hours as needed for mild pain (pain score 1-3) or fever.   acidophilus (RISAQUAD) CAPS capsule Take 2 capsules by mouth 3 (three) times daily.   allopurinol (ZYLOPRIM) 300 MG tablet Take 1 tablet (300 mg total) by mouth daily. Take 1  daily   diclofenac Sodium (VOLTAREN) 1 % GEL Apply 2 g topically 4 (  four) times daily. Apply to knees   diphenoxylate-atropine (LOMOTIL) 2.5-0.025 MG tablet Take 1 tablet by mouth 4 (four) times daily as needed for diarrhea or loose stools.   furosemide (LASIX) 20 MG tablet Take 1 tablet (20 mg total) by mouth every other day.   lipase/protease/amylase (CREON) 12000-38000 units CPEP capsule Take 2 capsules (24,000 Units total) by mouth 3 (three) times daily before meals.   metoprolol succinate (TOPROL-XL) 25 MG 24 hr tablet Take 1 tablet (25 mg total) by mouth daily.   pantoprazole (PROTONIX) 40 MG tablet Take 1 tablet (40 mg total) by mouth daily.   pravastatin (PRAVACHOL) 20 MG tablet Take 1 tablet (20 mg total) by mouth every evening.   sodium bicarbonate 650 MG tablet Take 1 tablet (650 mg total) by mouth 2 (two) times daily.   warfarin (COUMADIN) 5 MG tablet Take 0.5 tablets (2.5 mg total) by mouth every evening. Take 1 tablet by mouth on Monday, Wednesday, and Friday, and then 0.5 tablet by mouth daily on all the other days   No facility-administered encounter medications on file as of 01/27/2024.     SIGNIFICANT DIAGNOSTIC EXAMS  TODAY INR 2.9  Review of Systems  Constitutional:  Negative for malaise/fatigue.  Respiratory:  Negative for cough and shortness of breath.   Cardiovascular:  Negative for chest pain, palpitations and leg swelling.  Gastrointestinal:  Negative for abdominal pain, constipation and heartburn.  Musculoskeletal:  Negative for back pain, joint pain and myalgias.  Skin: Negative.    Neurological:  Negative for dizziness.  Psychiatric/Behavioral:  The patient is not nervous/anxious.    Physical Exam Constitutional:      General: He is not in acute distress.    Appearance: He is well-developed. He is obese. He is not diaphoretic.  Neck:     Thyroid: No thyromegaly.  Cardiovascular:     Rate and Rhythm: Normal rate and regular rhythm.     Heart sounds: Normal heart sounds.  Pulmonary:     Effort: Pulmonary effort is normal. No respiratory distress.     Breath sounds: Normal breath sounds.  Abdominal:     General: Bowel sounds are normal. There is no distension.     Palpations: Abdomen is soft.     Tenderness: There is no abdominal tenderness.  Musculoskeletal:        General: Normal range of motion.     Cervical back: Neck supple.     Right lower leg: No edema.     Left lower leg: No edema.  Lymphadenopathy:     Cervical: No cervical adenopathy.  Skin:    General: Skin is warm and dry.  Neurological:     Mental Status: He is alert and oriented to person, place, and time.  Psychiatric:        Mood and Affect: Mood normal.      ASSESSMENT/ PLAN:  Patient is being discharged with the following home health services: pt/ot to evaluate and treat as indicated for gait balance strength adl training.    Patient is being discharged with the following durable medical equipment:  none needed   Patient has been advised to f/u with their PCP in 1-2 weeks to for a transitions of care visit.  Social services at their facility was responsible for arranging this appointment.  Pt was provided with adequate prescriptions of noncontrolled medications to reach the scheduled appointment .  For controlled substances, a limited supply was provided as appropriate for the individual patient.  If the  pt normally receives these medications from a pain clinic or has a contract with another physician, these medications should be received from that clinic or physician only).    A 30  day supply of his prescription medications have been sent to [facility]  Time spent with patient 40 minutes: medications; dme; home health   Synthia Innocent NP [facility]  call [phone]

## 2024-01-27 NOTE — Progress Notes (Signed)
 Post-Acute Care Manager follow up. Mr. Pry resides in [facility]. Screening for potential complex care management services as benefit of health plan and primary care provider.  Update received from [facility] social worker. Mr. Gorr will discharge home with his wife. Will have [facility]. Has supportive family. Will transition home on 01/28/24.  Mr. Brinkmeyer has medically history of HTN, CAD, Afib, pacer, CHF.  Will plan to refer for VBCI CCM services upon discharge.

## 2024-01-29 ENCOUNTER — Institutional Professional Consult (permissible substitution) (INDEPENDENT_AMBULATORY_CARE_PROVIDER_SITE_OTHER): Payer: Medicare Other

## 2024-01-30 ENCOUNTER — Other Ambulatory Visit: Payer: Self-pay | Admitting: *Deleted

## 2024-01-30 DIAGNOSIS — I10 Essential (primary) hypertension: Secondary | ICD-10-CM

## 2024-01-30 DIAGNOSIS — A4181 Sepsis due to Enterococcus: Secondary | ICD-10-CM | POA: Diagnosis not present

## 2024-01-30 DIAGNOSIS — I110 Hypertensive heart disease with heart failure: Secondary | ICD-10-CM | POA: Diagnosis not present

## 2024-01-30 DIAGNOSIS — I2510 Atherosclerotic heart disease of native coronary artery without angina pectoris: Secondary | ICD-10-CM | POA: Diagnosis not present

## 2024-01-30 DIAGNOSIS — I480 Paroxysmal atrial fibrillation: Secondary | ICD-10-CM | POA: Diagnosis not present

## 2024-01-30 DIAGNOSIS — I5021 Acute systolic (congestive) heart failure: Secondary | ICD-10-CM | POA: Diagnosis not present

## 2024-01-30 DIAGNOSIS — J189 Pneumonia, unspecified organism: Secondary | ICD-10-CM | POA: Diagnosis not present

## 2024-01-30 NOTE — Patient Outreach (Signed)
 Post-Acute Care Manager follow up. Verified in [facility] Douglas [name] discharged from [facility] on 01/28/24 with [facility].   Screening for potential complex care management services as benefit of health plan and primary care provider.  Douglas [name] has medical history of HTN, CAD, Afib, pacer, CHF.  Referral made to [facility] complex care management team.    Douglas Noble, MSN, RN, BSN [place]  [facility], [facility] Commun[facility] RN Post- Acute Care Manager Direct Dial: [phone]

## 2024-01-31 ENCOUNTER — Telehealth: Payer: Self-pay | Admitting: *Deleted

## 2024-01-31 DIAGNOSIS — I5021 Acute systolic (congestive) heart failure: Secondary | ICD-10-CM | POA: Diagnosis not present

## 2024-01-31 DIAGNOSIS — J189 Pneumonia, unspecified organism: Secondary | ICD-10-CM | POA: Diagnosis not present

## 2024-01-31 DIAGNOSIS — A4181 Sepsis due to Enterococcus: Secondary | ICD-10-CM | POA: Diagnosis not present

## 2024-01-31 DIAGNOSIS — R197 Diarrhea, unspecified: Secondary | ICD-10-CM | POA: Diagnosis not present

## 2024-01-31 DIAGNOSIS — I2510 Atherosclerotic heart disease of native coronary artery without angina pectoris: Secondary | ICD-10-CM | POA: Diagnosis not present

## 2024-01-31 DIAGNOSIS — I480 Paroxysmal atrial fibrillation: Secondary | ICD-10-CM | POA: Diagnosis not present

## 2024-01-31 DIAGNOSIS — I110 Hypertensive heart disease with heart failure: Secondary | ICD-10-CM | POA: Diagnosis not present

## 2024-01-31 NOTE — Progress Notes (Signed)
 Complex Care Management Note Care Guide Note  01/31/2024 Name: Douglas [name] MRN: [id number] DOB: [birth year]   Complex Care Management Outreach Attempts: An unsuccessful telephone outreach was attempted today to offer the patient information about available complex care management services.  Follow Up Plan:  Additional outreach attempts will be made to offer the patient complex care management information and services.   Encounter Outcome: requested a call back when pt was there   Burman Nieves, CMA [place]  [facility], [facility] Guide Direct Dial: [phone]  Fax: [phone] Website: [email or web address]

## 2024-02-03 NOTE — Progress Notes (Unsigned)
 Complex Care Management Note Care Guide Note  02/03/2024 Name: Douglas [name] MRN: [id number] DOB: 12/14/1927   Complex Care Management Outreach Attempts: A second unsuccessful outreach was attempted today to offer the patient with information about available complex care management services.  Follow Up Plan:  Additional outreach attempts will be made to offer the patient complex care management information and services.   Encounter Outcome:  No Answer  Kandis Ormond, CMA [place]  [facility], [facility] Guide Direct Dial: [phone]  Fax: [phone] Website: [email or web address]

## 2024-02-04 DIAGNOSIS — I110 Hypertensive heart disease with heart failure: Secondary | ICD-10-CM | POA: Diagnosis not present

## 2024-02-04 DIAGNOSIS — J189 Pneumonia, unspecified organism: Secondary | ICD-10-CM | POA: Diagnosis not present

## 2024-02-04 DIAGNOSIS — I2510 Atherosclerotic heart disease of native coronary artery without angina pectoris: Secondary | ICD-10-CM | POA: Diagnosis not present

## 2024-02-04 DIAGNOSIS — I5021 Acute systolic (congestive) heart failure: Secondary | ICD-10-CM | POA: Diagnosis not present

## 2024-02-04 DIAGNOSIS — A4181 Sepsis due to Enterococcus: Secondary | ICD-10-CM | POA: Diagnosis not present

## 2024-02-04 DIAGNOSIS — I480 Paroxysmal atrial fibrillation: Secondary | ICD-10-CM | POA: Diagnosis not present

## 2024-02-04 NOTE — Progress Notes (Signed)
 Complex Care Management Note  Care Guide Note 02/04/2024 Name: JARRIEL PAPILLION MRN: [id number] DOB: [birth year]  Conda Deems Band is a 89 y.o. year old male who sees Artemisa Bile, MD for primary care. I reached out to [name] by phone today to offer complex care management services.  Mr. Kerby was given information about Complex Care Management services today including:   The Complex Care Management services include support from the care team which includes your Nurse Care Manager, Clinical Social Worker, or Pharmacist.  The Complex Care Management team is here to help remove barriers to the health concerns and goals most important to you. Complex Care Management services are voluntary, and the patient may decline or stop services at any time by request to their care team member.   Complex Care Management Consent Status: Patient agreed to services and verbal consent obtained.   Follow up plan:  Telephone appointment with complex care management team member scheduled for:  02/06/2024  Encounter Outcome:  Patient Scheduled  Kandis Ormond, CMA [place]  [facility], [facility] Guide Direct Dial: [phone]  Fax: [phone] Website: [email or web address]

## 2024-02-06 ENCOUNTER — Other Ambulatory Visit: Payer: Self-pay | Admitting: *Deleted

## 2024-02-06 ENCOUNTER — Other Ambulatory Visit: Payer: Self-pay

## 2024-02-06 ENCOUNTER — Encounter: Payer: Self-pay | Admitting: *Deleted

## 2024-02-06 NOTE — Patient Instructions (Signed)
 Visit Information  Thank you for taking time to visit with me today. Please don't hesitate to contact me if I can be of assistance to you before our next scheduled appointment.  Our next appointment is by telephone on 02/20/24 at 1100 Please call the care guide team at [phone] if you need to cancel or reschedule your appointment.   Following is a copy of your care plan:   Goals Addressed             This Visit's Progress    VBCI RN Care Plan   No change    Problems:  Chronic Disease Management support and education needs related to CHF and constipation Reported continued swelling of feet by patient, even with his weight remaining stable.    Reported constipation/bowel incontinence   Goal: Over the next 60 days the Patient will verbalize basic understanding of CHF and constipation disease process and self health management plan as evidenced by reported regular stools every 1-3 days and decreased swelling, fatigue Over the next 30 days the patient will verbalize reduction in swelling as confirmed by maintained daily weight  Interventions:   Heart Failure Interventions: Basic overview and discussion of pathophysiology of Heart Failure reviewed Reviewed Heart Failure Action Plan in depth and provided written copy Assessed need for readable accurate scales in home Discussed importance of daily weight and advised patient to weigh and record daily Reviewed role of diuretics in prevention of fluid overload and management of heart failure; Discussed the importance of keeping all appointments with provider Screening for signs and symptoms of depression related to chronic disease state  Assessed social determinant of health barriers  Confirmed medication adherence, monitoring of weight, Review of medications, Verbal Education about CHF provided & CHF education sent via AVS (cause, Symptoms, treatment adherence) Discussed fluid & sodium management. Encouraged physical activity   Patient  Self-Care Activities:  Attend all scheduled provider appointments Call pharmacy for medication refills 3-7 days in advance of running out of medications Call provider office for new concerns or questions  Take medications as prescribed   call office if I gain more than 2 pounds in one day or 5 pounds in one week keep legs up while sitting use salt in moderation watch for swelling in feet, ankles and legs every day weigh myself daily follow rescue plan if symptoms flare-up eat more whole grains, fruits and vegetables, lean meats and healthy fats  Plan:  Telephone follow up appointment with care management team member scheduled for:  02/20/24 1100 The patient has been provided with contact information for the care management team and has been advised to call with any health related questions or concerns.  Outreach to pcp for voiced worsening symptoms of CHF- swelling. Patient reports no improvement with daily swelling & questions if he Lasix dose need increasing              Please call the Suicide and Crisis Lifeline: 988 call the [place] National Suicide Prevention Lifeline: 6295852268 or TTY: (873)754-9406 TTY 912-067-6144) to talk to a trained counselor call 1-800-273-TALK (toll free, 24 hour hotline) call the [facility] (463) 076-1865 call 911 if you are experiencing a Mental Health or Behavioral Health Crisis or need someone to talk to.  Patient verbalizes understanding of instructions and care plan provided today and agrees to view in MyChart. Active MyChart status and patient understanding of how to access instructions and care plan via MyChart confirmed with patient.      Mckenzie Toruno L.  Mcarthur Speedy, RN, BSN, CCM [place]  [facility], [facility] RN Care Manager Direct Dial: [phone]  Fax: [phone]

## 2024-02-06 NOTE — Patient Outreach (Addendum)
 Complex Care Management   Visit Note  02/06/2024  Name:  Douglas [name] MRN: [id number] DOB: 1928/01/19  Situation: Referral received for Complex Care Management related to Heart Failure, Atrial Fibrillation, and AKI.SIRS during admission/Reffered by [facility] Post acute manager upon discharge from [facility] nursing facility  I obtained verbal consent from Patient.  Visit completed with [name], wife Douglas [name] and daughter Douglas [name] in the background per patient permission  on the phone  Background:   Past Medical History:  Diagnosis Date   Acute upper GI bleed 05/18/2016   Aneurysm of thoracic aorta (HCC)    Ascending. 5.7 x 5.5cm - Bentall procedure   Aortic stenosis    Bioprosthetic AVR   Arthritis    Atrial fibrillation (HCC)    Documented on pacer interrogation 6/13   Complete heart block (HCC)    Status post pacemaker placement   Coronary atherosclerosis of native coronary artery    Multivessel - LIMA to LAD, SVG to OM, SVG to PDA   DJD (degenerative joint disease)    Esophageal dysmotility    Essential hypertension    Fibromyalgia    Gout    H/O arteriovenous malformation (AVM)    H/O hiatal hernia    Hemorrhoid    History of esophageal ulcer    History of skin cancer    Hx of adenomatous colonic polyps    Iron deficiency anemia due to chronic blood loss    Melena    Pacemaker-Medtronic    PMR (polymyalgia rheumatica) (HCC)    SIRS (systemic inflammatory response syndrome) (HCC) 01/16/2015    Assessment: Patient Reported Symptoms:  Cognitive Cognitive Status: Alert and oriented to person, place, and time, Normal speech and language skills Cognitive/Intellectual Conditions Management [RPT]: None reported or documented in medical history or problem list   Health Maintenance Behaviors: Annual physical exam, Social activities Healing Pattern: Average Health Facilitated by: Healthy diet, Rest  Neurological   Neurological Management Strategies: Adequate rest, Routine  screening Neurological Self-Management Outcome: 3 (uncertain)  HEENT HEENT Symptoms Reported: Sore throat, Other: Other HEENT Symptoms/Conditions: Reports will have an office visit with ENT for a lumpp in his throat that is preventing him from swallowing HEENT Management Strategies: Adequate rest, Routine screening HEENT Self-Management Outcome: 3 (uncertain)    Cardiovascular Cardiovascular Symptoms Reported: Dizziness, Swelling in legs or feet, Fatigue, Lightheadness, Other: Other Cardiovascular Symptoms: swelling of feet Does patient have uncontrolled Hypertension?: No Cardiovascular Conditions: Heart failure, Hypertension, Dysrhythmia Cardiovascular Management Strategies: Medical device, Adequate rest, Medication therapy, Routine screening (compression hose) Weight: 198 lb (89.8 kg) Cardiovascular Self-Management Outcome: 2 (bad)  Respiratory Respiratory Symptoms Reported: No symptoms reported Respiratory Conditions: Asthma, Shortness of breath Respiratory Self-Management Outcome: 4 (good)  Endocrine Patient reports the following symptoms related to hypoglycemia or hyperglycemia : No symptoms reported Is patient diabetic?: No Endocrine Conditions: Vitamin D deficiency (pre diabetes) Endocrine Management Strategies: Adequate rest, Routine screening Endocrine Self-Management Outcome: 4 (good)  Gastrointestinal Gastrointestinal Symptoms Reported: Constipation, Incontinence Additional Gastrointestinal Details: poor appetite, has hemorrhoids bad incontinence of stool on 02/06/24 Gastrointestinal Conditions: Constipation, Fecal incontinence, Reflux/heartburn Gastrointestinal Management Strategies: Adequate rest, Incontinence garment/pad Gastrointestinal Self-Management Outcome: 3 (uncertain) Nutrition Risk Screen (CP): Difficulty chewing/swallowing  Genitourinary   Genitourinary Management Strategies: Adequate rest Genitourinary Self-Management Outcome: 4 (good)  Integumentary  Integumentary Symptoms Reported: No symptoms reported Additional Integumentary Details: skin pretty thin Skin Conditions: Eczema, Other Other Skin Conditions: PMH of furuncle Skin Management Strategies: Adequate rest, Routine screening, Medication therapy Skin Self-Management Outcome: 4 (good)  Skin Comment: reports easy to bruise, Denies bruise today  Musculoskeletal Musculoskelatal Symptoms Reviewed: Difficulty walking, Weakness Musculoskeletal Conditions: Back pain Musculoskeletal Management Strategies: Adequate rest, Routine screening Musculoskeletal Self-Management Outcome: 3 (uncertain) Falls in the past year?: No Number of falls in past year: 1 or less Was there an injury with Fall?: No Fall Risk Category Calculator: 0 Patient Fall Risk Level: Low Fall Risk Patient at Risk for Falls Due to: Impaired mobility, Impaired balance/gait Fall risk Follow up: Falls evaluation completed  Psychosocial   Behavioral Management Strategies: Adequate rest, Support system Behavioral Health Self-Management Outcome: 4 (good) Major Change/Loss/Stressor/Fears (CP): Denies Techniques to Cope with Loss/Stress/Change: Diversional activities Quality of Family Relationships: helpful, involved, supportive Do you feel physically threatened by others?: No      02/06/2024   12:04 PM  Depression screen PHQ 2/9  Decreased Interest 0  Down, Depressed, Hopeless 0  PHQ - 2 Score 0    There were no vitals filed for this visit.  Medications Reviewed Today     Reviewed by Clinton Gallant, RN (Registered Nurse) on 02/06/24 at 1116  Med List Status: <None>   Medication Order Taking? Sig Documenting Provider Last Dose Status Informant  acetaminophen (TYLENOL) 325 MG tablet [id number]  Take 2 tablets (650 mg total) by mouth every 6 (six) hours as needed for mild pain (pain score 1-3) or fever. Shon Hale, MD  Active   acidophilus (RISAQUAD) CAPS capsule [id number]  Take 2 capsules by mouth 3 (three)  times daily. Shon Hale, MD  Active   allopurinol (ZYLOPRIM) 300 MG tablet [id number]  Take 1 tablet (300 mg total) by mouth daily. Take 1  daily Sharee Holster, NP  Active   diclofenac Sodium (VOLTAREN) 1 % GEL [id number]  Apply 2 g topically 4 (four) times daily. Apply to knees Sharee Holster, NP  Active   diphenoxylate-atropine (LOMOTIL) 2.5-0.025 MG tablet [id number]  Take 1 tablet by mouth 4 (four) times daily as needed for diarrhea or loose stools. Sharee Holster, NP  Active   furosemide (LASIX) 20 MG tablet [id number] Yes Take 1 tablet (20 mg total) by mouth every other day. Sharee Holster, NP Taking Active   lipase/protease/amylase (CREON) 12000-38000 units CPEP capsule [id number]  Take 2 capsules (24,000 Units total) by mouth 3 (three) times daily before meals. Sharee Holster, NP  Active   metoprolol succinate (TOPROL-XL) 25 MG 24 hr tablet [id number]  Take 1 tablet (25 mg total) by mouth daily. Sharee Holster, NP  Active   pantoprazole (PROTONIX) 40 MG tablet [id number]  Take 1 tablet (40 mg total) by mouth daily. Sharee Holster, NP  Active   pravastatin (PRAVACHOL) 20 MG tablet [id number]  Take 1 tablet (20 mg total) by mouth every evening. Sharee Holster, NP  Active   sodium bicarbonate 650 MG tablet [id number]  Take 1 tablet (650 mg total) by mouth 2 (two) times daily. Sharee Holster, NP  Active   warfarin (COUMADIN) 5 MG tablet [id number]  Take 0.5 tablets (2.5 mg total) by mouth every evening. Take 1 tablet by mouth on Monday, Wednesday, and Friday, and then 0.5 tablet by mouth daily on all the other days Sharee Holster, NP  Active             Recommendation:   PCP Follow-up Specialty provider follow-up Cardiology, [facility] RN/PT Follow CHF action plan- elevate legs, weigh daily, take diuretic, reports weight gains 3-5 lbs, decrease salt, wear compression  type hose, keep mobile, Call EMS for shortness of breath, worsening/continued symptoms like  dizziness, chest pain etc  Follow Up Plan:   Telephone follow up appointment date/time:  02/20/24 1100 Provided RN CM office number to call if anything needed prior to 02/20/24 Encouraged to expect a return call from [employer] L. Mcarthur Speedy, RN, BSN, CCM [place]  [facility], [facility] RN Care Manager Direct Dial: [phone]  Fax: [phone]

## 2024-02-07 ENCOUNTER — Encounter: Payer: Self-pay | Admitting: *Deleted

## 2024-02-07 ENCOUNTER — Telehealth: Payer: Self-pay | Admitting: *Deleted

## 2024-02-07 DIAGNOSIS — I110 Hypertensive heart disease with heart failure: Secondary | ICD-10-CM | POA: Diagnosis not present

## 2024-02-07 DIAGNOSIS — I2510 Atherosclerotic heart disease of native coronary artery without angina pectoris: Secondary | ICD-10-CM | POA: Diagnosis not present

## 2024-02-07 DIAGNOSIS — I5021 Acute systolic (congestive) heart failure: Secondary | ICD-10-CM | POA: Diagnosis not present

## 2024-02-07 DIAGNOSIS — I480 Paroxysmal atrial fibrillation: Secondary | ICD-10-CM | POA: Diagnosis not present

## 2024-02-07 DIAGNOSIS — A4181 Sepsis due to Enterococcus: Secondary | ICD-10-CM | POA: Diagnosis not present

## 2024-02-07 DIAGNOSIS — J189 Pneumonia, unspecified organism: Secondary | ICD-10-CM | POA: Diagnosis not present

## 2024-02-07 NOTE — Patient Outreach (Signed)
 Unsuccessful outreach to speak with staff about the patient's voiced concerns and worsening symptoms after email sent to office 02/06/24. Unable to leave a message    Suzen L. Ramonita, RN, BSN, CCM [place]  [facility], [facility] RN Care Manager Direct Dial: [phone]  Fax: [phone]

## 2024-02-07 NOTE — Patient Outreach (Signed)
 Error with opening incorrect type of call note  Zayna Toste L. Mcarthur Speedy, RN, BSN, CCM [place]  [facility], [facility] RN Care Manager Direct Dial: [phone]  Fax: [phone]

## 2024-02-10 DIAGNOSIS — I2510 Atherosclerotic heart disease of native coronary artery without angina pectoris: Secondary | ICD-10-CM | POA: Diagnosis not present

## 2024-02-10 DIAGNOSIS — J189 Pneumonia, unspecified organism: Secondary | ICD-10-CM | POA: Diagnosis not present

## 2024-02-10 DIAGNOSIS — I5021 Acute systolic (congestive) heart failure: Secondary | ICD-10-CM | POA: Diagnosis not present

## 2024-02-10 DIAGNOSIS — I110 Hypertensive heart disease with heart failure: Secondary | ICD-10-CM | POA: Diagnosis not present

## 2024-02-10 DIAGNOSIS — I480 Paroxysmal atrial fibrillation: Secondary | ICD-10-CM | POA: Diagnosis not present

## 2024-02-10 DIAGNOSIS — A4181 Sepsis due to Enterococcus: Secondary | ICD-10-CM | POA: Diagnosis not present

## 2024-02-11 ENCOUNTER — Encounter (INDEPENDENT_AMBULATORY_CARE_PROVIDER_SITE_OTHER): Payer: Self-pay

## 2024-02-11 ENCOUNTER — Ambulatory Visit (INDEPENDENT_AMBULATORY_CARE_PROVIDER_SITE_OTHER): Payer: Medicare Other | Admitting: Otolaryngology

## 2024-02-11 VITALS — BP 145/76 | HR 89 | Ht 65.0 in | Wt 194.0 lb

## 2024-02-11 DIAGNOSIS — K219 Gastro-esophageal reflux disease without esophagitis: Secondary | ICD-10-CM

## 2024-02-11 DIAGNOSIS — I5021 Acute systolic (congestive) heart failure: Secondary | ICD-10-CM | POA: Diagnosis not present

## 2024-02-11 DIAGNOSIS — J189 Pneumonia, unspecified organism: Secondary | ICD-10-CM | POA: Diagnosis not present

## 2024-02-11 DIAGNOSIS — I2510 Atherosclerotic heart disease of native coronary artery without angina pectoris: Secondary | ICD-10-CM | POA: Diagnosis not present

## 2024-02-11 DIAGNOSIS — R1314 Dysphagia, pharyngoesophageal phase: Secondary | ICD-10-CM

## 2024-02-11 DIAGNOSIS — K224 Dyskinesia of esophagus: Secondary | ICD-10-CM

## 2024-02-11 DIAGNOSIS — R040 Epistaxis: Secondary | ICD-10-CM

## 2024-02-11 DIAGNOSIS — I480 Paroxysmal atrial fibrillation: Secondary | ICD-10-CM | POA: Diagnosis not present

## 2024-02-11 DIAGNOSIS — A4181 Sepsis due to Enterococcus: Secondary | ICD-10-CM | POA: Diagnosis not present

## 2024-02-11 DIAGNOSIS — I110 Hypertensive heart disease with heart failure: Secondary | ICD-10-CM | POA: Diagnosis not present

## 2024-02-11 MED ORDER — MUPIROCIN 2 % EX OINT: 1.0000 | TOPICAL_OINTMENT | Freq: Two times a day (BID) | CUTANEOUS | 0 refills | Status: DC

## 2024-02-11 NOTE — Progress Notes (Addendum)
 Dear Dr. Hildy Lowers, Here is my assessment for our mutual patient, Douglas [name]. Thank you for allowing me the opportunity to care for your patient. Please do not hesitate to contact me should you have any other questions. Sincerely, Dr. Milon Aloe  Otolaryngology Clinic Note Referring provider: Dr. Hildy Lowers HPI:  Douglas [name] is a 89 y.o. male kindly referred by Dr. Hildy Lowers for evaluation of epistaxis and diarrhea  Initial visits (01/2024): Patient reports: has had longstanding dysphagia, which is getting worse. He has had GI eval and multiple evaluations with SLP, with food reportedly getting hung up (points to throat/thyroid area). No regurgitation. Mostly occurs with solids, but also some with liquids. No chronic cough or throat clearing. Denies choking. Main concern is sensation of food getting stuck. He has never had swallow therapy as outpatient. Patient otherwise denies: - odynophagia, overt aspiration episodes (he denies it), need for Heimlich, unintentional weight loss (but his weight fluctuates due to lasix /diuresis so difficult to determine based on numbers) - changes in voice, shortness of breath, hemoptysis, tobacco - ear pain, neck masses  Epistaxis history: intermittent nose bleeds, last about a month ago but low volume except for one in December 2024 requiring visit to ER and stopped with Afrin/pressure.  CKD/Liver dysfunction: CKD Anticoagulation/AP: Eliquis Trauma: denies History of Sinusitis: denies Nasal obstruction: denies Nasal procedures: denies Current nasal medication use: denies  Personal or FHx of bleeding dz or anesthesia difficulty: no  GLP-1: no AP/AC: Warfarin  Tobacco: no  PMHx: HTN, Aortic Stenosis s/p AVR , CAD, A-fib, CHF, h/o espohageal ulcer, CHB with pacemaker, Thrombocytopenia  Independent Review of Additional Tests or Records:  01/10/2024 Dr. Quintella Buck (hospital admission [facility]):  admitted for dehydration v/s sepsis, and then prior to that for pneumonia;  concern for dysphagia, SLP eval done, rec Dysphagia 2 diet Azalee Lenz (GI) 01/16/2024: dysphagia chronic in setting of esophageal dysmotility and OP dysphagia; sensation of liquids stuck in throat; no stricture noted;  Clinical swallow eval Olena Bernard): continued rec dysphagia diet, recent decline; would rec swallow Rx? MBS 01/01/2023 and Esophagram 01/01/2023: independently interpreted - noted weakness pharyngeally with reduced epiglottic inversion and TB retraction with some penetration (thins), cannot swallow barium tablet; esophageal dysmotilty noted; rec dysphagia therapy; no significant stricture or CP bar Dr. Hildy Lowers referral notes (10/24/2023): noted epistaxis on left, on warfarin; Dx: Epistaxis; Rx: ref to ENT Labs: CBC and BMP 01/20/2024: BUN/Cr 33/1.23; WBC 12.6, Plt 175, Hgb 9.8  PMH/Meds/All/SocHx/FamHx/ROS:   Past Medical History:  Diagnosis Date   Acute upper GI bleed 05/18/2016   Aneurysm of thoracic aorta (HCC)    Ascending. 5.7 x 5.5cm - Bentall procedure   Aortic stenosis    Bioprosthetic AVR   Arthritis    Atrial fibrillation (HCC)    Documented on pacer interrogation 6/13   Complete heart block (HCC)    Status post pacemaker placement   Coronary atherosclerosis of native coronary artery    Multivessel - LIMA to LAD, SVG to OM, SVG to PDA   DJD (degenerative joint disease)    Esophageal dysmotility    Essential hypertension    Fibromyalgia    Gout    H/O arteriovenous malformation (AVM)    H/O hiatal hernia    Hemorrhoid    History of esophageal ulcer    History of skin cancer    Hx of adenomatous colonic polyps    Iron  deficiency anemia due to chronic blood loss    Melena    Pacemaker-Medtronic    PMR (polymyalgia  rheumatica) (HCC)    SIRS (systemic inflammatory response syndrome) (HCC) 01/16/2015     Past Surgical History:  Procedure Laterality Date   AORTIC VALVE REPLACEMENT  01/24/12   APPENDECTOMY     BACK SURGERY     1965 or 1966 lumbar back surgery    BENTALL PROCEDURE  01/24/2012   Procedure: BENTALL PROCEDURE;  Surgeon: Heriberto London, MD;  Location[facility] OR;  Service: Open Heart Surgery;  Laterality: N/A;   BILATERAL KNEE ARTHROSCOPY     CATARACT EXTRACTION W/PHACO Left 12/03/2016   Procedure: CATARACT EXTRACTION PHACO AND INTRAOCULAR LENS PLACEMENT LEFT EYE;  Surgeon: Anner Kill, MD;  Location: [facility] ORS;  Service: Ophthalmology;  Laterality: Left;  CDE:  10.87   CATARACT EXTRACTION W/PHACO Right 12/13/2016   Procedure: CATARACT EXTRACTION PHACO AND INTRAOCULAR LENS PLACEMENT (IOC);  Surgeon: Anner Kill, MD;  Location: [facility] ORS;  Service: Ophthalmology;  Laterality: Right;  right cde 11.78   CORONARY ARTERY BYPASS GRAFT  01/24/2012   Procedure: CORONARY ARTERY BYPASS GRAFTING (CABG);  Surgeon: Heriberto London, MD;  Location: [facility] OR;  Service: Open Heart Surgery;  Laterality: N/A;   ESOPHAGOGASTRODUODENOSCOPY  06/17/2012   Procedure: ESOPHAGOGASTRODUODENOSCOPY (EGD);  Surgeon: Ruby Corporal, MD;  Location: [facility]ITE;  Service: Endoscopy;  Laterality: N/A;  ED   ESOPHAGOGASTRODUODENOSCOPY N/A 05/19/2016   Procedure: ESOPHAGOGASTRODUODENOSCOPY (EGD);  Surgeon: Alyce Jubilee, MD;  Location: [facility];  Service: Endoscopy;  Laterality: N/A;   HEMORROIDECTOMY     HERNIA REPAIR     Left inguinal hernia repair     PACEMAKER PLACEMENT     Medtronic 4/13 - Dr. Nunzio Belch   PERMANENT PACEMAKER INSERTION N/A 02/01/2012   Procedure: PERMANENT PACEMAKER INSERTION;  Surgeon: Jolly Needle, MD; Medtronic Adapta L model ADDRL 1 (serial number [id number])     TRANSESOPHAGEAL ECHOCARDIOGRAM (CATH LAB) N/A 12/23/2023   Procedure: TRANSESOPHAGEAL ECHOCARDIOGRAM;  Surgeon: Sheryle Donning, MD;  Location: [facility] INVASIVE CV LAB;  Service: Cardiovascular;  Laterality: N/A;    Family History  Problem Relation Age of Onset   Kidney disease Mother    Heart disease Father    Anesthesia problems Neg Hx    Hypotension Neg Hx    Malignant hyperthermia Neg Hx     Pseudochol deficiency Neg Hx      Social Connections: Moderately Integrated (02/06/2024)   Social Connection and Isolation Panel [NHANES]    Frequency of Communication with Friends and Family: Twice a week    Frequency of Social Gatherings with Friends and Family: Twice a week    Attends Religious Services: 1 to 4 times per year    Active Member of [employer] or Organizations: No    Attends Banker Meetings: Never    Marital Status: Married  Recent Concern: Social Connections - Moderately Isolated (01/10/2024)   Social Connection and Isolation Panel [NHANES]    Frequency of Communication with Friends and Family: Never    Frequency of Social Gatherings with Friends and Family: Once a week    Attends Religious Services: 1 to 4 times per year    Active Member of [employer] or Organizations: No    Attends Engineer, structural: Never    Marital Status: Married      Current Outpatient Medications:    acetaminophen  (TYLENOL ) 325 MG tablet, Take 2 tablets (650 mg total) by mouth every 6 (six) hours as needed for mild pain (pain score 1-3) or fever., Disp: , Rfl:    acidophilus (RISAQUAD)  CAPS capsule, Take 2 capsules by mouth 3 (three) times daily., Disp: 180 capsule, Rfl: 1   allopurinol  (ZYLOPRIM ) 300 MG tablet, Take 1 tablet (300 mg total) by mouth daily. Take 1  daily, Disp: 30 tablet, Rfl: 0   cycloSPORINE (RESTASIS) 0.05 % ophthalmic emulsion, 1 drop 2 (two) times daily., Disp: , Rfl:    diclofenac  Sodium (VOLTAREN ) 1 % GEL, Apply 2 g topically 4 (four) times daily. Apply to knees, Disp: 300 g, Rfl: 0   furosemide  (LASIX ) 20 MG tablet, Take 1 tablet (20 mg total) by mouth every other day., Disp: 15 tablet, Rfl: 0   LINZESS  145 MCG CAPS capsule, Take 145 mcg by mouth daily., Disp: , Rfl:    metoprolol  succinate (TOPROL -XL) 25 MG 24 hr tablet, Take 1 tablet (25 mg total) by mouth daily., Disp: 30 tablet, Rfl: 0   mupirocin  ointment (BACTROBAN ) 2 %, Apply 1 Application topically 2  (two) times daily. Apply a pea sized amount to left nostril twice per day, Disp: 22 g, Rfl: 0   pantoprazole  (PROTONIX ) 40 MG tablet, Take 1 tablet (40 mg total) by mouth daily., Disp: 30 tablet, Rfl: 0   pravastatin  (PRAVACHOL ) 20 MG tablet, Take 1 tablet (20 mg total) by mouth every evening., Disp: 30 tablet, Rfl: 0   sodium bicarbonate  650 MG tablet, Take 1 tablet (650 mg total) by mouth 2 (two) times daily., Disp: 60 tablet, Rfl: 0   warfarin (COUMADIN ) 5 MG tablet, Take 0.5 tablets (2.5 mg total) by mouth every evening. Take 1 tablet by mouth on Monday, Wednesday, and Friday, and then 0.5 tablet by mouth daily on all the other days, Disp: 60 tablet, Rfl: 0   diphenoxylate -atropine  (LOMOTIL ) 2.5-0.025 MG tablet, Take 1 tablet by mouth 4 (four) times daily as needed for diarrhea or loose stools. (Patient not taking: Reported on 02/11/2024), Disp: 30 tablet, Rfl: 0   lipase/protease/amylase (CREON ) 12000-38000 units CPEP capsule, Take 2 capsules (24,000 Units total) by mouth 3 (three) times daily before meals. (Patient not taking: Reported on 02/11/2024), Disp: 180 capsule, Rfl: 0   Physical Exam:   BP (!) 145/76 (BP Location: Right Arm, Patient Position: Sitting, Cuff Size: Large)   Pulse 89   Ht 5\' 5"  (1.651 m)   Wt 194 lb (88 kg)   SpO2 90%   BMI 32.28 kg/m   Salient findings:  CN II-XII intact  Bilateral EAC clear and TM intact with well pneumatized middle ear spaces Anterior rhinoscopy: Septum relatively midline; bilateral inferior turbinates without significant hypertrophy; Mild left septal vessel prominence, no significant clot or crusting No lesions of oral cavity/oropharynx No obviously palpable neck masses/lymphadenopathy/thyromegaly No respiratory distress or stridor; slight wetness in voice, quality 2  Seprately Identifiable Procedures:  Prior to initiating any procedures, risks/benefits/alternatives were explained to the patient and verbal consent obtained. Procedure  Note Pre-procedure diagnosis:  Dysphagia Post-procedure diagnosis: Same Procedure: Transnasal Fiberoptic Laryngoscopy, CPT 31575 - Mod 25 Indication: dysphagia Complications: None apparent EBL: 0 mL  The procedure was undertaken to further evaluate the patient's complaint of dysphagia, with mirror exam inadequate for appropriate examination due to gag reflex and poor patient tolerance  Procedure:  Patient was identified as correct patient. Verbal consent was obtained. The nose was sprayed with oxymetazoline  and 4% lidocaine . The The flexible laryngoscope was passed through the nose to view the nasal cavity, pharynx (oropharynx, hypopharynx) and larynx.  The larynx was examined at rest and during multiple phonatory tasks. Documentation was obtained and reviewed with  patient. The scope was removed. The patient tolerated the procedure well.  Findings: The nasal cavity and nasopharynx did not reveal any masses or lesions, mucosa appeared to be without obvious lesions. The tongue base, pharyngeal walls, piriform sinuses, vallecula, epiglottis and postcricoid region are normal in appearance - no significant retained pyriform or vallecular secretions. The visualized portion of the subglottis and proximal trachea is widely patent. The vocal folds are mobile bilaterally. There are no lesions on the free edge of the vocal folds nor elsewhere in the larynx worrisome for malignancy.      Electronically signed by: Evelina Hippo, MD 02/11/2024 1:53 PM   Impression & Plans:  Douglas [name] is a 89 y.o. male with:  1. Pharyngoesophageal dysphagia   2. Esophageal dysmotility   3. Epistaxis    We discussed his imaging and prior workup. Most likely, his dysphagia is multifactorial including deconditioning and esophageal dysmotility. No signficant stricture noted. As such, we discussed options for management: 1. Observation 2. Swallow therapy 3. Referral to GI for further w/u of esophageal dysmotility. After  extensive discussion, patient opted for swallow therapy and we will make a referral for that. Dysphagia diet recommendations discussed.  Epistaxis: discussed options including humidification and cautery. He is not having significant issues from an epistaxis standpoint and would like to try humidification. Will start with mupirocin  BID x7d and vaseline or ayr gel thereafter  D/w pt f/u, and he'd like to do PRN instead of 1 year Call in case of questions or concerns  See below regarding exact medications prescribed this encounter including dosages and route: Meds ordered this encounter  Medications   mupirocin  ointment (BACTROBAN ) 2 %    Sig: Apply 1 Application topically 2 (two) times daily. Apply a pea sized amount to left nostril twice per day    Dispense:  22 g    Refill:  0      Thank you for allowing me the opportunity to care for your patient. Please do not hesitate to contact me should you have any other questions.  Sincerely, Milon Aloe, MD Otolaryngologist (ENT), [facility] Phone: [phone] Fax: [phone]  02/11/2024, 1:53 PM   I have personally spent 62 minutes involved in face-to-face and non-face-to-face activities for this patient on the day of the visit.  Professional time spent excludes any procedures performed but includes the following activities, in addition to those noted in the documentation: preparing to see the patient (review of outside documentation and results which was extensive), performing a medically appropriate examination, counseling, ordering medications (mupirocin ), documenting in the electronic health record, independently interpreting results (MBS, Esophagram).

## 2024-02-11 NOTE — Patient Instructions (Addendum)
 Douglas [name] swallow therapy number: [phone] [facility] swallow therapy number: [phone]

## 2024-02-12 ENCOUNTER — Ambulatory Visit: Attending: Cardiology | Admitting: *Deleted

## 2024-02-12 DIAGNOSIS — I480 Paroxysmal atrial fibrillation: Secondary | ICD-10-CM | POA: Insufficient documentation

## 2024-02-12 DIAGNOSIS — Z952 Presence of prosthetic heart valve: Secondary | ICD-10-CM | POA: Insufficient documentation

## 2024-02-12 DIAGNOSIS — Z5181 Encounter for therapeutic drug level monitoring: Secondary | ICD-10-CM | POA: Diagnosis not present

## 2024-02-12 LAB — POCT INR: INR: 2.7 (ref 2.0–3.0)

## 2024-02-12 NOTE — Addendum Note (Signed)
 Addended by: Tyheem Boughner on: 02/12/2024 11:21 AM   Modules accepted: Orders

## 2024-02-12 NOTE — Patient Instructions (Signed)
 Continue warfarin 1/2 tablet daily except 1 tablet on Mondays, Wednesdays and Fridays Recheck in 3 wks Continue greens.

## 2024-02-13 DIAGNOSIS — I480 Paroxysmal atrial fibrillation: Secondary | ICD-10-CM | POA: Diagnosis not present

## 2024-02-13 DIAGNOSIS — I110 Hypertensive heart disease with heart failure: Secondary | ICD-10-CM | POA: Diagnosis not present

## 2024-02-13 DIAGNOSIS — I5021 Acute systolic (congestive) heart failure: Secondary | ICD-10-CM | POA: Diagnosis not present

## 2024-02-13 DIAGNOSIS — J189 Pneumonia, unspecified organism: Secondary | ICD-10-CM | POA: Diagnosis not present

## 2024-02-13 DIAGNOSIS — I2510 Atherosclerotic heart disease of native coronary artery without angina pectoris: Secondary | ICD-10-CM | POA: Diagnosis not present

## 2024-02-13 DIAGNOSIS — A4181 Sepsis due to Enterococcus: Secondary | ICD-10-CM | POA: Diagnosis not present

## 2024-02-14 DIAGNOSIS — I480 Paroxysmal atrial fibrillation: Secondary | ICD-10-CM | POA: Diagnosis not present

## 2024-02-14 DIAGNOSIS — A4181 Sepsis due to Enterococcus: Secondary | ICD-10-CM | POA: Diagnosis not present

## 2024-02-14 DIAGNOSIS — J189 Pneumonia, unspecified organism: Secondary | ICD-10-CM | POA: Diagnosis not present

## 2024-02-14 DIAGNOSIS — I2510 Atherosclerotic heart disease of native coronary artery without angina pectoris: Secondary | ICD-10-CM | POA: Diagnosis not present

## 2024-02-14 DIAGNOSIS — I5021 Acute systolic (congestive) heart failure: Secondary | ICD-10-CM | POA: Diagnosis not present

## 2024-02-14 DIAGNOSIS — I110 Hypertensive heart disease with heart failure: Secondary | ICD-10-CM | POA: Diagnosis not present

## 2024-02-17 DIAGNOSIS — Z85828 Personal history of other malignant neoplasm of skin: Secondary | ICD-10-CM | POA: Diagnosis not present

## 2024-02-17 DIAGNOSIS — C44329 Squamous cell carcinoma of skin of other parts of face: Secondary | ICD-10-CM | POA: Diagnosis not present

## 2024-02-18 ENCOUNTER — Ambulatory Visit (INDEPENDENT_AMBULATORY_CARE_PROVIDER_SITE_OTHER): Payer: Medicare Other

## 2024-02-18 DIAGNOSIS — I442 Atrioventricular block, complete: Secondary | ICD-10-CM | POA: Diagnosis not present

## 2024-02-18 DIAGNOSIS — J189 Pneumonia, unspecified organism: Secondary | ICD-10-CM | POA: Diagnosis not present

## 2024-02-18 DIAGNOSIS — A4181 Sepsis due to Enterococcus: Secondary | ICD-10-CM | POA: Diagnosis not present

## 2024-02-18 DIAGNOSIS — I480 Paroxysmal atrial fibrillation: Secondary | ICD-10-CM | POA: Diagnosis not present

## 2024-02-18 DIAGNOSIS — I5021 Acute systolic (congestive) heart failure: Secondary | ICD-10-CM | POA: Diagnosis not present

## 2024-02-18 DIAGNOSIS — I2510 Atherosclerotic heart disease of native coronary artery without angina pectoris: Secondary | ICD-10-CM | POA: Diagnosis not present

## 2024-02-18 DIAGNOSIS — I110 Hypertensive heart disease with heart failure: Secondary | ICD-10-CM | POA: Diagnosis not present

## 2024-02-19 ENCOUNTER — Encounter: Payer: Self-pay | Admitting: Internal Medicine

## 2024-02-19 DIAGNOSIS — I2510 Atherosclerotic heart disease of native coronary artery without angina pectoris: Secondary | ICD-10-CM | POA: Diagnosis not present

## 2024-02-19 DIAGNOSIS — I480 Paroxysmal atrial fibrillation: Secondary | ICD-10-CM | POA: Diagnosis not present

## 2024-02-19 DIAGNOSIS — A4181 Sepsis due to Enterococcus: Secondary | ICD-10-CM | POA: Diagnosis not present

## 2024-02-19 DIAGNOSIS — J189 Pneumonia, unspecified organism: Secondary | ICD-10-CM | POA: Diagnosis not present

## 2024-02-19 DIAGNOSIS — I5021 Acute systolic (congestive) heart failure: Secondary | ICD-10-CM | POA: Diagnosis not present

## 2024-02-19 DIAGNOSIS — I110 Hypertensive heart disease with heart failure: Secondary | ICD-10-CM | POA: Diagnosis not present

## 2024-02-19 LAB — CUP PACEART REMOTE DEVICE CHECK
Battery Impedance: 3019 Ohm
Battery Remaining Longevity: 29 mo
Battery Voltage: 2.71 V
Brady Statistic RV Percent Paced: 100 %
Date Time Interrogation Session: 20250429165640
Implantable Lead Connection Status: 753985
Implantable Lead Connection Status: 753985
Implantable Lead Implant Date: 20130412
Implantable Lead Implant Date: 20130412
Implantable Lead Location: 753859
Implantable Lead Location: 753860
Implantable Lead Model: 5076
Implantable Lead Model: 5076
Implantable Pulse Generator Implant Date: 20130412
Lead Channel Impedance Value: 499 Ohm
Lead Channel Impedance Value: 67 Ohm
Lead Channel Pacing Threshold Amplitude: 0.625 V
Lead Channel Pacing Threshold Pulse Width: 0.4 ms
Lead Channel Setting Pacing Amplitude: 2 V
Lead Channel Setting Pacing Pulse Width: 0.4 ms
Lead Channel Setting Sensing Sensitivity: 4 mV
Zone Setting Status: 755011
Zone Setting Status: 755011

## 2024-02-20 ENCOUNTER — Other Ambulatory Visit: Payer: Self-pay | Admitting: *Deleted

## 2024-02-20 ENCOUNTER — Encounter: Payer: Self-pay | Admitting: *Deleted

## 2024-02-20 ENCOUNTER — Other Ambulatory Visit: Payer: Self-pay

## 2024-02-20 DIAGNOSIS — I5021 Acute systolic (congestive) heart failure: Secondary | ICD-10-CM | POA: Diagnosis not present

## 2024-02-20 DIAGNOSIS — I480 Paroxysmal atrial fibrillation: Secondary | ICD-10-CM | POA: Diagnosis not present

## 2024-02-20 DIAGNOSIS — I2510 Atherosclerotic heart disease of native coronary artery without angina pectoris: Secondary | ICD-10-CM | POA: Diagnosis not present

## 2024-02-20 DIAGNOSIS — I110 Hypertensive heart disease with heart failure: Secondary | ICD-10-CM | POA: Diagnosis not present

## 2024-02-20 DIAGNOSIS — J189 Pneumonia, unspecified organism: Secondary | ICD-10-CM | POA: Diagnosis not present

## 2024-02-20 DIAGNOSIS — A4181 Sepsis due to Enterococcus: Secondary | ICD-10-CM | POA: Diagnosis not present

## 2024-02-20 NOTE — Patient Instructions (Signed)
 Visit Information  Thank you for taking time to visit with me today. Please don't hesitate to contact me if I can be of assistance to you before our next scheduled appointment.  Your next care management appointment is by telephone on 03/26/24 at 1100  Please call if you start having any worsening symptoms in between of doctor visits  Please call the care guide team at [phone] if you need to cancel, schedule, or reschedule an appointment.   Please call the Suicide and Crisis Lifeline: 988 call the [place]  National Suicide Prevention Lifeline: 920-147-7659 or TTY: 951-108-4444 TTY (321)206-4208) to talk to a trained counselor call 1-800-273-TALK (toll free, 24 hour hotline) call the [facility]: 2067386520 call 911 if you are experiencing a Mental Health or Behavioral Health Crisis or need someone to talk to.  Rishabh Rinkenberger L. Mcarthur Speedy, RN, BSN, CCM [place]  [facility], [facility] Health RN Care Manager Direct Dial: [phone]  Fax: [phone]

## 2024-02-20 NOTE — Patient Outreach (Signed)
 Complex Care Management   Visit Note  02/20/2024  Name:  Douglas [name] MRN: [id number] DOB: 1928-06-11  Situation: Referral received for Complex Care Management related to Atrial Fibrillation and discharge from [facility] on 01/28/[facility]  I obtained verbal consent from Patient.  Visit completed with Douglas [name]  on the phone  Background:   Past Medical History:  Diagnosis Date   Acute upper GI bleed 05/18/2016   Aneurysm of thoracic aorta (HCC)    Ascending. 5.7 x 5.5cm - Bentall procedure   Aortic stenosis    Bioprosthetic AVR   Arthritis    Atrial fibrillation (HCC)    Documented on pacer interrogation 6/13   Complete heart block (HCC)    Status post pacemaker placement   Coronary atherosclerosis of native coronary artery    Multivessel - LIMA to LAD, SVG to OM, SVG to PDA   DJD (degenerative joint disease)    Esophageal dysmotility    Essential hypertension    Fibromyalgia    Gout    H/O arteriovenous malformation (AVM)    H/O hiatal hernia    Hemorrhoid    History of esophageal ulcer    History of skin cancer    Hx of adenomatous colonic polyps    Iron  deficiency anemia due to chronic blood loss    Melena    Pacemaker-Medtronic    PMR (polymyalgia rheumatica) (HCC)    SIRS (systemic inflammatory response syndrome) (HCC) 01/16/2015    Assessment: Patient Reported Symptoms:  Cognitive Cognitive Status: Alert and oriented to person, place, and time, Insightful and able to interpret abstract concepts, Normal speech and language skills      Neurological Neurological Review of Symptoms: No symptoms reported Neurological Self-Management Outcome: 4 (good)  HEENT HEENT Symptoms Reported: No symptoms reported HEENT Self-Management Outcome: 4 (good)    Cardiovascular Cardiovascular Symptoms Reported: No symptoms reported (Denies worsening symptoms) Cardiovascular Self-Management Outcome: 4 (good)  Respiratory Respiratory Symptoms Reported:  No symptoms reported Respiratory Self-Management Outcome: 4 (good)  Endocrine Patient reports the following symptoms related to hypoglycemia or hyperglycemia : Weakness or fatigue Endocrine Self-Management Outcome: 4 (good)  Gastrointestinal Gastrointestinal Symptoms Reported: No symptoms reported (Reportspoor appetite, hemorrhoid pain,  constipation resolved, no abdominal pain) Gastrointestinal Conditions: Bleeding, Constipation, Diarrhea, Fecal incontinence, Reflux/heartburn, Other Other Gastrointestinal Conditions: hx hemorrhoids, hx upper GI bleed Gastrointestinal Self-Management Outcome: 4 (good) Gastrointestinal Comment: Reports chewing better Nutrition Risk Screen (CP): No indicators present  Genitourinary Genitourinary Symptoms Reported: No symptoms reported Genitourinary Self-Management Outcome: 4 (good)  Integumentary Integumentary Symptoms Reported: No symptoms reported Skin Self-Management Outcome: 4 (good)  Musculoskeletal Musculoskelatal Symptoms Reviewed: Difficulty walking, Weakness, Muscle pain, Unsteady gait Musculoskeletal Conditions: Back pain, Fibromyalgia, Mobility limited, Osteoarthritis, Other Other Musculoskeletal Conditions: degenerative disc disease, PMR- polymyalgia rheumatica Musculoskeletal Management Strategies: Activity, Adequate rest, Medication therapy, Routine screening Musculoskeletal Self-Management Outcome: 3 (uncertain) (improving continues with home health RN, PT- PT visited today for evaluation/re certification)      Psychosocial Psychosocial Symptoms Reported: Alteration in eating habits Additional Psychological Details: hx of swallowing, esophageal dysmotility, regurgitation of food- today patient reports no worsening symptoms Behavioral Health Conditions: Eating disorder Behavioral Health Self-Management Outcome: 4 (good) Major Change/Loss/Stressor/Fears (CP): Denies Techniques to Cope with Loss/Stress/Change: Diversional activities, Spiritual  practice(s)        02/20/2024   12:02 PM  Depression screen PHQ 2/9  Decreased Interest 0  Down, Depressed, Hopeless 0  PHQ - 2 Score 0    There were no  vitals filed for this visit.  Medications Reviewed Today     Reviewed by Arlyce Berger, RN (Registered Nurse) on 02/20/24 at 1211  Med List Status: <None>   Medication Order Taking? Sig Documenting Provider Last Dose Status Informant  acetaminophen  (TYLENOL ) 325 MG tablet [id number] Yes Take 2 tablets (650 mg total) by mouth every 6 (six) hours as needed for mild pain (pain score 1-3) or fever. Colin Dawley, MD Taking Active   acidophilus (RISAQUAD) CAPS capsule [id number]  Take 2 capsules by mouth 3 (three) times daily. Colin Dawley, MD  Active   allopurinol  (ZYLOPRIM ) 300 MG tablet [id number]  Take 1 tablet (300 mg total) by mouth daily. Take 1  daily Marilyne Shu, NP  Active   cycloSPORINE (RESTASIS) 0.05 % ophthalmic emulsion [id number]  1 drop 2 (two) times daily. [provider]  Active   diclofenac  Sodium (VOLTAREN ) 1 % GEL [id number] Yes Apply 2 g topically 4 (four) times daily. Apply to knees Marilyne Shu, NP Taking Active   diphenoxylate -atropine  (LOMOTIL ) 2.5-0.025 MG tablet [id number]  Take 1 tablet by mouth 4 (four) times daily as needed for diarrhea or loose stools.  Patient not taking: Reported on 02/11/2024   Marilyne Shu, NP  Active   furosemide  (LASIX ) 20 MG tablet [id number] Yes Take 1 tablet (20 mg total) by mouth every other day. Marilyne Shu, NP Taking Active   LINZESS  145 MCG CAPS capsule [id number] Yes Take 145 mcg by mouth daily. [provider] Taking Active   lipase/protease/amylase (CREON ) 12000-38000 units CPEP capsule [id number]  Take 2 capsules (24,000 Units total) by mouth 3 (three) times daily before meals.  Patient not taking: Reported on 02/11/2024   Marilyne Shu, NP  Active   metoprolol  succinate (TOPROL -XL) 25 MG 24 hr tablet [id number]  Take 1 tablet (25 mg  total) by mouth daily. Marilyne Shu, NP  Active   mupirocin  ointment (BACTROBAN ) 2 % [id number]  Apply 1 Application topically 2 (two) times daily. Apply a pea sized amount to left nostril twice per day Patel, Kunjan B, MD  Active   pantoprazole  (PROTONIX ) 40 MG tablet [id number] Yes Take 1 tablet (40 mg total) by mouth daily. Marilyne Shu, NP Taking Active   pravastatin  (PRAVACHOL ) 20 MG tablet [id number]  Take 1 tablet (20 mg total) by mouth every evening. Marilyne Shu, NP  Active   sodium bicarbonate  650 MG tablet [id number]  Take 1 tablet (650 mg total) by mouth 2 (two) times daily. Marilyne Shu, NP  Active   warfarin (COUMADIN ) 5 MG tablet [id number]  Take 0.5 tablets (2.5 mg total) by mouth every evening. Take 1 tablet by mouth on Monday, Wednesday, and Friday, and then 0.5 tablet by mouth daily on all the other days Marilyne Shu, NP  Active             Recommendation:   PCP Follow-up Complete labs before pcp visit Call pcp or RN CM with any worsening symptoms between PCP visits    Follow Up Plan:   Telephone follow up appointment date/time:  03/26/24 1100  Jalia Zuniga L. Mcarthur Speedy, RN, BSN, CCM [place]  [facility], [facility] RN Care Manager Direct Dial: [phone]  Fax: [phone]

## 2024-02-21 DIAGNOSIS — I482 Chronic atrial fibrillation, unspecified: Secondary | ICD-10-CM | POA: Diagnosis not present

## 2024-02-21 DIAGNOSIS — E8721 Acute metabolic acidosis: Secondary | ICD-10-CM | POA: Diagnosis not present

## 2024-02-21 DIAGNOSIS — Z79899 Other long term (current) drug therapy: Secondary | ICD-10-CM | POA: Diagnosis not present

## 2024-02-21 DIAGNOSIS — D649 Anemia, unspecified: Secondary | ICD-10-CM | POA: Diagnosis not present

## 2024-02-21 DIAGNOSIS — I10 Essential (primary) hypertension: Secondary | ICD-10-CM | POA: Diagnosis not present

## 2024-02-21 DIAGNOSIS — I5032 Chronic diastolic (congestive) heart failure: Secondary | ICD-10-CM | POA: Diagnosis not present

## 2024-02-24 DIAGNOSIS — R1312 Dysphagia, oropharyngeal phase: Secondary | ICD-10-CM | POA: Diagnosis not present

## 2024-02-24 DIAGNOSIS — I712 Thoracic aortic aneurysm, without rupture, unspecified: Secondary | ICD-10-CM | POA: Diagnosis not present

## 2024-02-24 DIAGNOSIS — I2510 Atherosclerotic heart disease of native coronary artery without angina pectoris: Secondary | ICD-10-CM | POA: Diagnosis not present

## 2024-02-24 DIAGNOSIS — N179 Acute kidney failure, unspecified: Secondary | ICD-10-CM | POA: Diagnosis not present

## 2024-02-24 DIAGNOSIS — I071 Rheumatic tricuspid insufficiency: Secondary | ICD-10-CM | POA: Diagnosis not present

## 2024-02-24 DIAGNOSIS — I330 Acute and subacute infective endocarditis: Secondary | ICD-10-CM | POA: Diagnosis not present

## 2024-02-24 DIAGNOSIS — M109 Gout, unspecified: Secondary | ICD-10-CM | POA: Diagnosis not present

## 2024-02-24 DIAGNOSIS — F419 Anxiety disorder, unspecified: Secondary | ICD-10-CM | POA: Diagnosis not present

## 2024-02-24 DIAGNOSIS — M1990 Unspecified osteoarthritis, unspecified site: Secondary | ICD-10-CM | POA: Diagnosis not present

## 2024-02-24 DIAGNOSIS — I5021 Acute systolic (congestive) heart failure: Secondary | ICD-10-CM | POA: Diagnosis not present

## 2024-02-24 DIAGNOSIS — E785 Hyperlipidemia, unspecified: Secondary | ICD-10-CM | POA: Diagnosis not present

## 2024-02-24 DIAGNOSIS — E871 Hypo-osmolality and hyponatremia: Secondary | ICD-10-CM | POA: Diagnosis not present

## 2024-02-24 DIAGNOSIS — A419 Sepsis, unspecified organism: Secondary | ICD-10-CM | POA: Diagnosis not present

## 2024-02-24 DIAGNOSIS — I110 Hypertensive heart disease with heart failure: Secondary | ICD-10-CM | POA: Diagnosis not present

## 2024-02-24 DIAGNOSIS — E876 Hypokalemia: Secondary | ICD-10-CM | POA: Diagnosis not present

## 2024-02-24 DIAGNOSIS — I442 Atrioventricular block, complete: Secondary | ICD-10-CM | POA: Diagnosis not present

## 2024-02-24 DIAGNOSIS — R739 Hyperglycemia, unspecified: Secondary | ICD-10-CM | POA: Diagnosis not present

## 2024-02-24 DIAGNOSIS — I480 Paroxysmal atrial fibrillation: Secondary | ICD-10-CM | POA: Diagnosis not present

## 2024-02-24 DIAGNOSIS — I959 Hypotension, unspecified: Secondary | ICD-10-CM | POA: Diagnosis not present

## 2024-02-24 DIAGNOSIS — E872 Acidosis, unspecified: Secondary | ICD-10-CM | POA: Diagnosis not present

## 2024-02-24 DIAGNOSIS — E860 Dehydration: Secondary | ICD-10-CM | POA: Diagnosis not present

## 2024-02-24 DIAGNOSIS — J690 Pneumonitis due to inhalation of food and vomit: Secondary | ICD-10-CM | POA: Diagnosis not present

## 2024-02-24 DIAGNOSIS — K219 Gastro-esophageal reflux disease without esophagitis: Secondary | ICD-10-CM | POA: Diagnosis not present

## 2024-02-27 DIAGNOSIS — I5021 Acute systolic (congestive) heart failure: Secondary | ICD-10-CM | POA: Diagnosis not present

## 2024-02-27 DIAGNOSIS — I110 Hypertensive heart disease with heart failure: Secondary | ICD-10-CM | POA: Diagnosis not present

## 2024-02-27 DIAGNOSIS — A419 Sepsis, unspecified organism: Secondary | ICD-10-CM | POA: Diagnosis not present

## 2024-02-27 DIAGNOSIS — N179 Acute kidney failure, unspecified: Secondary | ICD-10-CM | POA: Diagnosis not present

## 2024-02-27 DIAGNOSIS — J690 Pneumonitis due to inhalation of food and vomit: Secondary | ICD-10-CM | POA: Diagnosis not present

## 2024-02-27 DIAGNOSIS — I2510 Atherosclerotic heart disease of native coronary artery without angina pectoris: Secondary | ICD-10-CM | POA: Diagnosis not present

## 2024-02-28 DIAGNOSIS — R197 Diarrhea, unspecified: Secondary | ICD-10-CM | POA: Diagnosis not present

## 2024-02-28 DIAGNOSIS — R609 Edema, unspecified: Secondary | ICD-10-CM | POA: Diagnosis not present

## 2024-02-28 DIAGNOSIS — I480 Paroxysmal atrial fibrillation: Secondary | ICD-10-CM | POA: Diagnosis not present

## 2024-03-03 DIAGNOSIS — N179 Acute kidney failure, unspecified: Secondary | ICD-10-CM | POA: Diagnosis not present

## 2024-03-03 DIAGNOSIS — A419 Sepsis, unspecified organism: Secondary | ICD-10-CM | POA: Diagnosis not present

## 2024-03-03 DIAGNOSIS — J690 Pneumonitis due to inhalation of food and vomit: Secondary | ICD-10-CM | POA: Diagnosis not present

## 2024-03-03 DIAGNOSIS — I5021 Acute systolic (congestive) heart failure: Secondary | ICD-10-CM | POA: Diagnosis not present

## 2024-03-03 DIAGNOSIS — I110 Hypertensive heart disease with heart failure: Secondary | ICD-10-CM | POA: Diagnosis not present

## 2024-03-03 DIAGNOSIS — I2510 Atherosclerotic heart disease of native coronary artery without angina pectoris: Secondary | ICD-10-CM | POA: Diagnosis not present

## 2024-03-04 ENCOUNTER — Ambulatory Visit: Attending: Cardiology | Admitting: *Deleted

## 2024-03-04 DIAGNOSIS — Z5181 Encounter for therapeutic drug level monitoring: Secondary | ICD-10-CM | POA: Insufficient documentation

## 2024-03-04 DIAGNOSIS — Z952 Presence of prosthetic heart valve: Secondary | ICD-10-CM | POA: Insufficient documentation

## 2024-03-04 DIAGNOSIS — I480 Paroxysmal atrial fibrillation: Secondary | ICD-10-CM | POA: Insufficient documentation

## 2024-03-04 LAB — POCT INR: INR: 2.2 (ref 2.0–3.0)

## 2024-03-04 NOTE — Patient Instructions (Signed)
 Continue warfarin 1/2 tablet daily except 1 tablet on Mondays, Wednesdays and Fridays Recheck in 4 wks Continue greens.

## 2024-03-26 ENCOUNTER — Other Ambulatory Visit: Payer: Self-pay | Admitting: *Deleted

## 2024-03-26 ENCOUNTER — Telehealth: Payer: Self-pay | Admitting: *Deleted

## 2024-03-30 NOTE — Patient Outreach (Signed)
 Complex Care Management   Visit Note  04/23/2024 Updated note for 03/26/24   Name:  Douglas [name] MRN: [id number] DOB: 1928-04-06  Situation: Referral received for Complex Care Management related to Atrial Fibrillation and discharge from [facility] on 01/28/24 [facility]. I obtained verbal consent from Patient.  Visit completed with Douglas [name]  on the phone  Background:   Past Medical History:  Diagnosis Date   Acute upper GI bleed 05/18/2016   Aneurysm of thoracic aorta (HCC)    Ascending. 5.7 x 5.5cm - Bentall procedure   Aortic stenosis    Bioprosthetic AVR   Arthritis    Atrial fibrillation (HCC)    Documented on pacer interrogation 6/13   Complete heart block (HCC)    Status post pacemaker placement   Coronary atherosclerosis of native coronary artery    Multivessel - LIMA to LAD, SVG to OM, SVG to PDA   DJD (degenerative joint disease)    Esophageal dysmotility    Essential hypertension    Fibromyalgia    Gout    H/O arteriovenous malformation (AVM)    H/O hiatal hernia    Hemorrhoid    History of esophageal ulcer    History of skin cancer    Hx of adenomatous colonic polyps    Iron  deficiency anemia due to chronic blood loss    Melena    Pacemaker-Medtronic    PMR (polymyalgia rheumatica) (HCC)    SIRS (systemic inflammatory response syndrome) (HCC) 01/16/2015    Assessment: Patient Reported Symptoms:  Cognitive Cognitive Status: Alert and oriented to person, place, and time, Insightful and able to interpret abstract concepts, Normal speech and language skills      Neurological Neurological Review of Symptoms: No symptoms reported Neurological Management Strategies: Adequate rest, Routine screening Neurological Self-Management Outcome: 4 (good)  HEENT HEENT Symptoms Reported: No symptoms reported HEENT Management Strategies: Adequate rest, Routine screening HEENT Self-Management Outcome: 4 (good)    Cardiovascular Cardiovascular  Symptoms Reported: Fatigue Weight: 171 lb (77.6 kg) (was 191lb)  Respiratory Respiratory Symptoms Reported: Shortness of breath, Productive cough    Endocrine Endocrine Symptoms Reported: Weakness or fatigue Is patient diabetic?: No Endocrine Self-Management Outcome: 4 (good)  Gastrointestinal Gastrointestinal Symptoms Reported: Constipation, Flatulence, Vomiting Additional Gastrointestinal Details: denies change in appetite. he reports some blood in phlegm not urine Gastrointestinal Management Strategies: Adequate rest, Medication therapy Gastrointestinal Self-Management Outcome: 4 (good) Nutrition Risk Screen (CP): No indicators present  Genitourinary Genitourinary Symptoms Reported: Urgency, Frequency    Integumentary Integumentary Symptoms Reported: No symptoms reported Skin Management Strategies: Adequate rest, Routine screening Skin Self-Management Outcome: 4 (good)  Musculoskeletal Musculoskelatal Symptoms Reviewed: Difficulty walking, Muscle pain, Unsteady gait, Weakness Musculoskeletal Management Strategies: Adequate rest, Medication therapy, Routine screening Musculoskeletal Self-Management Outcome: 3 (uncertain) Falls in the past year?: No Number of falls in past year: 1 or less Was there an injury with Fall?: No Fall Risk Category Calculator: 0 Patient Fall Risk Level: Low Fall Risk Fall risk Follow up: Falls evaluation completed  Psychosocial Psychosocial Symptoms Reported: Alteration in eating habits Additional Psychological Details: hx of swallowing, esophageal dysmotility, regurgitation of food- today patient reports no worsening symptoms Behavioral Management Strategies: Adequate rest, Support system Behavioral Health Self-Management Outcome: 4 (good) Major Change/Loss/Stressor/Fears (CP): Medical condition, self Techniques to Cope with Loss/Stress/Change: Diversional activities, Spiritual practice(s) Quality of Family Relationships: helpful, involved, supportive Do  you feel physically threatened by others?: No      03/26/2024    3:45 PM  Depression screen PHQ 2/9  Decreased Interest 0  Down, Depressed, Hopeless 0  PHQ - 2 Score 0    There were no vitals filed for this visit.  Medications Reviewed Today   Medications were not reviewed in this encounter     Recommendation:   PCP Follow-up Continue Current Plan of Care  Follow Up Plan:   Telephone follow up appointment date/time:  04/27/24 2 pm  Douglas [name] L. Ramonita, RN, BSN, CCM   [facility], [facility] RN Care Manager Direct Dial: [phone]  Fax: [phone]

## 2024-03-31 ENCOUNTER — Other Ambulatory Visit (HOSPITAL_COMMUNITY): Payer: Self-pay | Admitting: Internal Medicine

## 2024-03-31 ENCOUNTER — Ambulatory Visit (HOSPITAL_COMMUNITY)
Admission: RE | Admit: 2024-03-31 | Discharge: 2024-03-31 | Disposition: A | Discharge status: Home / Self Care | Source: Ambulatory Visit | Attending: Internal Medicine | Admitting: Internal Medicine

## 2024-03-31 DIAGNOSIS — R634 Abnormal weight loss: Secondary | ICD-10-CM | POA: Diagnosis not present

## 2024-03-31 DIAGNOSIS — I517 Cardiomegaly: Secondary | ICD-10-CM | POA: Diagnosis not present

## 2024-03-31 DIAGNOSIS — R042 Hemoptysis: Secondary | ICD-10-CM | POA: Insufficient documentation

## 2024-03-31 DIAGNOSIS — J929 Pleural plaque without asbestos: Secondary | ICD-10-CM | POA: Diagnosis not present

## 2024-04-01 ENCOUNTER — Ambulatory Visit: Attending: Cardiology | Admitting: *Deleted

## 2024-04-01 ENCOUNTER — Encounter

## 2024-04-01 DIAGNOSIS — I480 Paroxysmal atrial fibrillation: Secondary | ICD-10-CM | POA: Diagnosis not present

## 2024-04-01 DIAGNOSIS — Z5181 Encounter for therapeutic drug level monitoring: Secondary | ICD-10-CM | POA: Diagnosis not present

## 2024-04-01 DIAGNOSIS — Z952 Presence of prosthetic heart valve: Secondary | ICD-10-CM | POA: Insufficient documentation

## 2024-04-01 LAB — POCT INR: INR: 1.7 — AB (ref 2.0–3.0)

## 2024-04-01 NOTE — Patient Instructions (Signed)
 Take warfarin 1 1/2 tablets today, 1 tablet tomorrow then resume 1/2 tablet daily except 1 tablet on Mondays, Wednesdays and Fridays Recheck in 3 wks Continue greens.

## 2024-04-03 NOTE — Addendum Note (Signed)
 Addended by: Lott Rouleau A on: 04/03/2024 10:26 AM   Modules accepted: Orders

## 2024-04-03 NOTE — Progress Notes (Signed)
 Remote pacemaker transmission.

## 2024-04-07 ENCOUNTER — Other Ambulatory Visit (HOSPITAL_COMMUNITY): Payer: Self-pay | Admitting: Internal Medicine

## 2024-04-07 DIAGNOSIS — R042 Hemoptysis: Secondary | ICD-10-CM

## 2024-04-22 ENCOUNTER — Ambulatory Visit: Attending: Cardiology | Admitting: *Deleted

## 2024-04-22 DIAGNOSIS — I480 Paroxysmal atrial fibrillation: Secondary | ICD-10-CM | POA: Diagnosis not present

## 2024-04-22 DIAGNOSIS — Z952 Presence of prosthetic heart valve: Secondary | ICD-10-CM | POA: Insufficient documentation

## 2024-04-22 DIAGNOSIS — Z5181 Encounter for therapeutic drug level monitoring: Secondary | ICD-10-CM | POA: Diagnosis not present

## 2024-04-22 LAB — POCT INR: INR: 1.4 — AB (ref 2.0–3.0)

## 2024-04-22 NOTE — Patient Instructions (Signed)
 Take warfarin 1 1/2 tablets tonight then increase dose to 1 tablet daily except 1/2 tablet on Tuesdays and Saturdays Recheck in 2 wks Continue greens.

## 2024-04-22 NOTE — Progress Notes (Signed)
Please see anticoagulation encounter.

## 2024-04-23 ENCOUNTER — Ambulatory Visit (HOSPITAL_COMMUNITY)
Admission: RE | Admit: 2024-04-23 | Discharge: 2024-04-23 | Disposition: A | Discharge status: Home / Self Care | Source: Ambulatory Visit | Attending: Internal Medicine | Admitting: Internal Medicine

## 2024-04-23 DIAGNOSIS — R042 Hemoptysis: Secondary | ICD-10-CM | POA: Insufficient documentation

## 2024-04-23 DIAGNOSIS — J929 Pleural plaque without asbestos: Secondary | ICD-10-CM | POA: Diagnosis not present

## 2024-04-23 DIAGNOSIS — I7121 Aneurysm of the ascending aorta, without rupture: Secondary | ICD-10-CM | POA: Diagnosis not present

## 2024-04-23 NOTE — Patient Instructions (Signed)
 Visit Information  Thank you for taking time to visit with me today. Please don't hesitate to contact me if I can be of assistance to you before our next scheduled appointment.  Your next care management appointment is by telephone on 04/27/24 at 2 pm   I will call to check on  you around 2 pm Please call if anything is needed   Please call the care guide team at [phone] if you need to cancel, schedule, or reschedule an appointment.   Please call the Suicide and Crisis Lifeline: 988 call the [place]  National Suicide Prevention Lifeline: 214 784 5187 or TTY: 432-225-0069 TTY 682-690-5714) to talk to a trained counselor call 1-800-273-TALK (toll free, 24 hour hotline) call the [facility]: (985)384-0350 call 911 if you are experiencing a Mental Health or Behavioral Health Crisis or need someone to talk to.  Kathye Cipriani L. Ramonita, RN, BSN, CCM [place]  [facility], [facility] RN Care Manager Direct Dial: [phone]  Fax: [phone]

## 2024-04-27 ENCOUNTER — Other Ambulatory Visit: Payer: Self-pay | Admitting: *Deleted

## 2024-04-27 ENCOUNTER — Other Ambulatory Visit: Payer: Self-pay

## 2024-04-27 NOTE — Patient Instructions (Signed)
 Visit Information  Thank you for taking time to visit with me today. Please don't hesitate to contact me if I can be of assistance to you before our next scheduled appointment.  Your next care management appointment is by telephone on 05/28/24 at 3 pm  Please speak with your pcp further about your CT chest and prostrate/voiding concerns  Please call the care guide team at [phone] if you need to cancel, schedule, or reschedule an appointment.   Please call the Suicide and Crisis Lifeline: 988 call the [place]  National Suicide Prevention Lifeline: (818) 455-9727 or TTY: (986) 010-7819 TTY 618-072-5477) to talk to a trained counselor call 1-800-273-TALK (toll free, 24 hour hotline) call the [facility]: 8591356921 call 911 if you are experiencing a Mental Health or Behavioral Health Crisis or need someone to talk to.  Theodis Kinsel L. Ramonita, RN, BSN, CCM [place]  [facility], [facility] RN Care Manager Direct Dial: [phone]  Fax: [phone]

## 2024-04-27 NOTE — Patient Outreach (Signed)
 Complex Care Management   Visit Note  04/27/2024  Name:  Douglas [name] MRN: [id number] DOB: Jun 16, 1928  Situation: Referral received for Complex Care Management related to Atrial Fibrillation and discharge from [facility] on 01/28/[facility] I obtained verbal consent from Patient.  Visit completed with Douglas [name]  on the phone  Douglas [name] reports overall he is doing better  Hemoptysis He reports today that this issue is resolved.  He reports he coughs of more dark brown phlegm vs red daily Today RN CCM noted audible throat congestion as during the outreach. Douglas. [name] confirms daily throat phlegm He confirms he has seen an ENT 3-4 months ago who informed him there were no concerns He reports  a continued irritation  in the back of his throat. He has not used mucinex  nor nasal sprays like flonase/afrin to help. He reports he will purchase and try a nasal spray  CT chest- Douglas [name] reports he has not been updated on his recent CT chest and has not accessed MyChart to review the results He reports understanding of the impression results read to him today  He confirms he will also follow up with his pcp in August 2025   Constipation - still continues He takes medicine 3-5 days a week but confirms he does not drink enough fluids daily (generally 8 oz at night & 8 oz during the day + a banana once a day) He voiced the understanding of the need to increase his fluids, vegetables, fiber & fruits to help resolve his constipation.   Weight remains around 171-3 lbs daily Body Mass Index (BMI)  at 5'4 & 171 lbs = 29.3  He voices the understanding of the BMI calculation  Vaccines/care gap review (pneumonia) completed per the patient during his recent skilled nursing facility (snf) visits   Allergy irritants- He confirms he does not have pets, pest   He voices understanding of allergen causing increased phlegm production   Sleeping continues to have to get up every 2  hour to go to the bathroom at night but takes Lasix  only in the morning   Voiced understanding of possible prostate concerns if he is not emptying completely. He wants to discuss this with pcp in August 2025   Background:   Past Medical History:  Diagnosis Date   Acute upper GI bleed 05/18/2016   Aneurysm of thoracic aorta (HCC)    Ascending. 5.7 x 5.5cm - Bentall procedure   Aortic stenosis    Bioprosthetic AVR   Arthritis    Atrial fibrillation (HCC)    Documented on pacer interrogation 6/13   Complete heart block (HCC)    Status post pacemaker placement   Coronary atherosclerosis of native coronary artery    Multivessel - LIMA to LAD, SVG to OM, SVG to PDA   DJD (degenerative joint disease)    Esophageal dysmotility    Essential hypertension    Fibromyalgia    Gout    H/O arteriovenous malformation (AVM)    H/O hiatal hernia    Hemorrhoid    History of esophageal ulcer    History of skin cancer    Hx of adenomatous colonic polyps    Iron  deficiency anemia due to chronic blood loss    Melena    Pacemaker-Medtronic    PMR (polymyalgia rheumatica) (HCC)    SIRS (systemic inflammatory response syndrome) (HCC) 01/16/2015    Assessment: Patient Reported Symptoms:  Cognitive Cognitive Status: Alert and oriented to  person, place, and time, No symptoms reported, Normal speech and language skills, Insightful and able to interpret abstract concepts Cognitive/Intellectual Conditions Management [RPT]: None reported or documented in medical history or problem list   Health Maintenance Behaviors: Annual physical exam, Immunizations Healing Pattern: Unsure Health Facilitated by: Rest  Neurological Neurological Review of Symptoms: No symptoms reported Neurological Self-Management Outcome: 4 (good)  HEENT HEENT Symptoms Reported: Other: (phlegm in back of throat daily) HEENT Management Strategies: Routine screening HEENT Self-Management Outcome: 3 (uncertain)    Cardiovascular  Cardiovascular Symptoms Reported: No symptoms reported Does patient have uncontrolled Hypertension?: No, Yes Is patient checking Blood Pressure at home?: Yes Patient's Recent BP reading at home: unknown Cardiovascular Management Strategies: Medication therapy, Routine screening Weight: 171 lb (77.6 kg) Cardiovascular Self-Management Outcome: 4 (good)  Respiratory Respiratory Symptoms Reported: Productive cough, Shortness of breath Other Respiratory Symptoms: post nasal mucus Respiratory Management Strategies: Routine screening, Breathing techniques (coughing up mucus daily) Respiratory Self-Management Outcome: 3 (uncertain)  Endocrine Endocrine Symptoms Reported: Shortness of breath, Increased urination Is patient diabetic?: No Endocrine Self-Management Outcome: 4 (good)  Gastrointestinal Gastrointestinal Symptoms Reported: Constipation Gastrointestinal Management Strategies: Activity, Medication therapy Gastrointestinal Self-Management Outcome: 4 (good) Nutrition Risk Screen (CP): No indicators present  Genitourinary Genitourinary Symptoms Reported: Frequency, Urgency Genitourinary Management Strategies: Activity Genitourinary Self-Management Outcome: 3 (uncertain)  Integumentary Integumentary Symptoms Reported: No symptoms reported Skin Self-Management Outcome: 4 (good)  Musculoskeletal Musculoskelatal Symptoms Reviewed: Difficulty walking Musculoskeletal Management Strategies: Activity, Medication therapy, Routine screening Musculoskeletal Self-Management Outcome: 3 (uncertain) Falls in the past year?: No Number of falls in past year: 1 or less Was there an injury with Fall?: No Fall Risk Category Calculator: 0 Patient Fall Risk Level: Low Fall Risk Patient at Risk for Falls Due to: Impaired mobility Fall risk Follow up: Falls evaluation completed, Falls prevention discussed  Psychosocial Psychosocial Symptoms Reported: No symptoms reported Behavioral Management Strategies:  Support system Behavioral Health Self-Management Outcome: 4 (good) Major Change/Loss/Stressor/Fears (CP): Medical condition, self Techniques to Cope with Loss/Stress/Change: Diversional activities, Spiritual practice(s) Quality of Family Relationships: helpful, involved, supportive Do you feel physically threatened by others?: No      03/26/2024    3:45 PM  Depression screen PHQ 2/9  Decreased Interest 0  Down, Depressed, Hopeless 0  PHQ - 2 Score 0    There were no vitals filed for this visit.  Medications Reviewed Today   Medications were not reviewed in this encounter     Recommendation:   PCP Follow-up Continue Current Plan of Care Discussed prostate/frequent voiding with pcp, further discuss the CT chest with pcp, over the counter nasal sprays to be tried  Follow Up Plan:   Telephone follow up appointment date/time:  05/28/24 3 pm  Argelio Granier L. Ramonita, RN, BSN, CCM [place]  [facility], [facility] RN Care Manager Direct Dial: [phone]  Fax: [phone]

## 2024-04-29 ENCOUNTER — Ambulatory Visit: Admitting: Cardiology

## 2024-05-11 ENCOUNTER — Encounter: Payer: Self-pay | Admitting: Cardiology

## 2024-05-11 ENCOUNTER — Ambulatory Visit (INDEPENDENT_AMBULATORY_CARE_PROVIDER_SITE_OTHER): Admitting: *Deleted

## 2024-05-11 ENCOUNTER — Ambulatory Visit: Attending: Cardiology | Admitting: Cardiology

## 2024-05-11 VITALS — BP 138/74 | HR 75 | Ht 65.0 in | Wt 179.4 lb

## 2024-05-11 DIAGNOSIS — I442 Atrioventricular block, complete: Secondary | ICD-10-CM | POA: Diagnosis not present

## 2024-05-11 DIAGNOSIS — Z952 Presence of prosthetic heart valve: Secondary | ICD-10-CM | POA: Diagnosis not present

## 2024-05-11 DIAGNOSIS — I480 Paroxysmal atrial fibrillation: Secondary | ICD-10-CM | POA: Diagnosis not present

## 2024-05-11 DIAGNOSIS — I5022 Chronic systolic (congestive) heart failure: Secondary | ICD-10-CM | POA: Insufficient documentation

## 2024-05-11 DIAGNOSIS — Z953 Presence of xenogenic heart valve: Secondary | ICD-10-CM | POA: Insufficient documentation

## 2024-05-11 DIAGNOSIS — I25119 Atherosclerotic heart disease of native coronary artery with unspecified angina pectoris: Secondary | ICD-10-CM | POA: Insufficient documentation

## 2024-05-11 DIAGNOSIS — Z5181 Encounter for therapeutic drug level monitoring: Secondary | ICD-10-CM | POA: Insufficient documentation

## 2024-05-11 LAB — POCT INR: INR: 1.5 — AB (ref 2.0–3.0)

## 2024-05-11 NOTE — Patient Instructions (Addendum)
 Medication Instructions:  .inscur  Labwork: none  Testing/Procedures: Your physician has requested that you have an echocardiogram in August 2025. Echocardiography is a painless test that uses sound waves to create images of your heart. It provides your doctor with information about the size and shape of your heart and how well your heart's chambers and valves are working. This procedure takes approximately one hour. There are no restrictions for this procedure. Please do NOT wear cologne, perfume, aftershave, or lotions (deodorant is allowed). Please arrive 15 minutes prior to your appointment time.  Please note: We ask at that you not bring children with you during ultrasound (echo/ vascular) testing. Due to room size and safety concerns, children are not allowed in the ultrasound rooms during exams. Our front office staff cannot provide observation of children in our lobby area while testing is being conducted. An adult accompanying a patient to their appointment will only be allowed in the ultrasound room at the discretion of the ultrasound technician under special circumstances. We apologize for any inconvenience.  Follow-Up: Your physician recommends that you schedule a follow-up appointment in: 6 months  Any Other Special Instructions Will Be Listed Below (If Applicable).  If you need a refill on your cardiac medications before your next appointment, please call your pharmacy.

## 2024-05-11 NOTE — Progress Notes (Signed)
Please see anticoagulation encounter.

## 2024-05-11 NOTE — Patient Instructions (Signed)
 Take warfarin 1 1/2 tablets tonight then increase dose to 1 tablet daily  Recheck in 2 wks Continue greens.

## 2024-05-11 NOTE — Progress Notes (Signed)
 Cardiology Office Note  Date: 05/11/2024   ID: Divit, Stipp 19-Sep-1928, MRN [id number]  History of Present Illness: Douglas [name] is a 89 y.o. male last seen in January by Ms. Strader PA-C, I reviewed her note as well as interval records.  He was hospitalized in March with sepsis and documented Enterobacter faecalis bacteremia.  He did undergo TEE during that hospitalization that demonstrated no valvular vegetations including his prosthetic aortic valve and also device wire.  LVEF was found to be 40 to 45% at that point with global hypokinesis.  He is here today for a follow-up visit.  Reports NYHA class II dyspnea with basic ADLs, no fluid retention, no orthopnea or PND.  States that he lost weight earlier in the year, but this has stabilized by subsequent checks.  Appetite fair.  Sometimes has phlegm production, has to clear his throat.  No fevers or chills.  Medtronic pacemaker in place with follow-up by Dr. Waddell.  Device interrogation in April revealed normal function with brief episodes of NSVT and underlying atrial fibrillation.  We went over his medications.  He continues on Coumadin  with follow-up in the anticoagulation clinic.  No change in low-dose diuretic.  He was started on Synthroid by Dr. Sheryle in the interim.  Physical Exam: VS:  BP 138/74   Pulse 75   Ht 5' 5 (1.651 m)   Wt 179 lb 6.4 oz (81.4 kg)   SpO2 98%   BMI 29.85 kg/m , BMI Body mass index is 29.85 kg/m.  Wt Readings from Last 3 Encounters:  05/11/24 179 lb 6.4 oz (81.4 kg)  04/27/24 171 lb (77.6 kg)  03/26/24 171 lb (77.6 kg)    General: Patient appears comfortable at rest. HEENT: Conjunctiva and lids normal. Neck: Supple, no elevated JVP or carotid bruits. Lungs: Clear to auscultation, nonlabored breathing at rest. Cardiac: Regular rate and rhythm, no S3, 2/6 systolic murmur, no pericardial rub. Extremities: No pitting edema.  ECG:  An ECG dated 01/10/2024 was personally reviewed today and  demonstrated:  Ventricular paced rhythm.  Labwork: 12/21/2023: B Natriuretic Peptide 820.2 01/10/2024: ALT 10; AST 14 01/12/2024: Magnesium  1.3 01/20/2024: BUN 33; Creatinine, Ser 1.23; Hemoglobin 9.8; Platelets 175; Potassium 4.4; Sodium 138  June 2025: TSH 11.2, hemoglobin 11.8, platelets 222, BUN 45, creatinine 1.18, GFR 57, potassium 4.7, AST 33, ALT 24  Other Studies Reviewed Today:  TEE 12/23/2023:  1. Left ventricular ejection fraction, by estimation, is 40 to 45%. The  left ventricle has mildly decreased function. The left ventricle  demonstrates global hypokinesis.   2. Right ventricular systolic function is low normal. The right  ventricular size is mildly enlarged. There is moderately elevated  pulmonary artery systolic pressure. The estimated right ventricular  systolic pressure is 50.0 mmHg.   3. Left atrial size was severely dilated. No left atrial/left atrial  appendage thrombus was detected. The LAA emptying velocity was 17 cm/s.   4. Right atrial size was moderately dilated.   5. The mitral valve is normal in structure. Mild to moderate mitral valve  regurgitation. No evidence of mitral stenosis.   6. Tricuspid valve regurgitation is moderate.   7. The aortic valve has been repaired/replaced. There is mild  calcification of the aortic valve. There is mild thickening of the aortic  valve. Aortic valve regurgitation is not visualized. There is a 23 mm  Edwards bioprosthetic valve present in the  aortic position. Procedure Date: 01/24/12.   8. S/P ascending aorta repair  01/25/2012 with 28 mm Bentall conduit. No  evidence of abscess. Aortic root/ascending aorta has been  repaired/replaced.   9. 3D performed of the aortic valve and demonstrates No evidence of  aortic valve endocarditis.   Assessment and Plan:  1.  Persistent atrial fibrillation with CHA2DS2-VASc score of 4.  Reports no palpitations and continues on Coumadin  with follow-up in the anticoagulation clinic.   Continue Toprol -XL 25 mg daily.   2.  Complete heart block status post Medtronic pacemaker with follow-up by Dr. Waddell.   3.  Status post Bentall procedure with 23 mm Edwards pericardial valve along with 28mm Gelweave Valsalva ascending aorta graft in 2013 at the time of CABG. he was hospitalized in March with Enterobacter faecalis bacteremia, TEE at that time did not demonstrate any valvular or device vegetations..   4.  Multivessel CAD status post CABG with LIMA to LAD, SVG to OM, and SVG to PDA in 2013.  No active angina.  Continue Pravachol  20 mg daily.  5.  Primary hypertension.  No change in present regimen.  6.  HFmrEF, LVEF 40 to 45% during hospitalization back in March.  Plan to update limited echocardiogram in August.  For now he is on Toprol -XL 25 mg daily and Lasix  20 mg daily.  Disposition:  Follow up 6 months.  Signed, Jayson JUDITHANN Sierras, M.D., F.A.C.C. [facility]

## 2024-05-14 ENCOUNTER — Ambulatory Visit: Attending: Cardiology

## 2024-05-14 DIAGNOSIS — I5022 Chronic systolic (congestive) heart failure: Secondary | ICD-10-CM | POA: Diagnosis not present

## 2024-05-18 ENCOUNTER — Ambulatory Visit: Payer: Self-pay | Admitting: Cardiology

## 2024-05-18 LAB — ECHOCARDIOGRAM COMPLETE
AR max vel: 3.1 cm2
AV Area VTI: 3.24 cm2
AV Area mean vel: 3.17 cm2
AV Mean grad: 2 mmHg
AV Peak grad: 2.8 mmHg
Ao pk vel: 0.84 m/s
Area-P 1/2: 5.38 cm2
Calc EF: 50.9 %
MV M vel: 5.28 m/s
MV Peak grad: 111.5 mmHg
MV VTI: 1.83 cm2
S' Lateral: 3.4 cm
Single Plane A2C EF: 61.6 %
Single Plane A4C EF: 43.9 %

## 2024-05-19 ENCOUNTER — Ambulatory Visit: Payer: Medicare Other

## 2024-05-25 ENCOUNTER — Ambulatory Visit: Attending: Cardiology | Admitting: *Deleted

## 2024-05-25 DIAGNOSIS — Z5181 Encounter for therapeutic drug level monitoring: Secondary | ICD-10-CM | POA: Diagnosis not present

## 2024-05-25 DIAGNOSIS — Z952 Presence of prosthetic heart valve: Secondary | ICD-10-CM | POA: Diagnosis not present

## 2024-05-25 DIAGNOSIS — I480 Paroxysmal atrial fibrillation: Secondary | ICD-10-CM | POA: Insufficient documentation

## 2024-05-25 LAB — POCT INR: INR: 2.4 (ref 2.0–3.0)

## 2024-05-25 NOTE — Progress Notes (Signed)
 INR-2.4 Please see anticoagulation encounter.

## 2024-05-25 NOTE — Patient Instructions (Signed)
 Continue warfarin 1 tablet daily  Recheck in 4 wks Continue greens.

## 2024-05-27 ENCOUNTER — Ambulatory Visit (INDEPENDENT_AMBULATORY_CARE_PROVIDER_SITE_OTHER)

## 2024-05-27 DIAGNOSIS — I442 Atrioventricular block, complete: Secondary | ICD-10-CM | POA: Diagnosis not present

## 2024-05-28 ENCOUNTER — Other Ambulatory Visit: Payer: Self-pay | Admitting: *Deleted

## 2024-05-28 ENCOUNTER — Encounter: Payer: Self-pay | Admitting: *Deleted

## 2024-05-28 NOTE — Patient Instructions (Signed)
 [name] - I am sorry I was unable to reach you today for our scheduled appointment. I work with Sheryle Carwin, MD and am calling to support your healthcare needs. Please contact me at [phone] at your earliest convenience. I look forward to speaking with you soon.   Thank you,  Suzen L. Ramonita, RN, BSN, CCM [place]  [facility], [facility] RN Care Manager Direct Dial: [phone]  Fax: [phone]

## 2024-05-28 NOTE — Patient Outreach (Signed)
 Complex Care Management   Visit Note  07/30/2024 updated for 05/28/24  Name:  Douglas [name] MRN: [id number] DOB: [birth year]  Situation: Referral received for Complex Care Management related to hypertension . Discharged from [facility] on 01/28/24. Will have [facility]. Has multiple co-morbidities I obtained verbal consent from Patient.  Visit completed with patient  on the phone  Patient more concerned with a need for his, Wife Vernell  She is getting a bill from [facility] for a statement about a wheelchair she never had- ordered by [facility] who is a skilled nursing facility  voiced understanding when we spoke with Quintin at [facility] to confirm the wheelchair was obtained from [facility] She was on oxygen 11/4/ date of care  Wheelchair (W/C)   VOiced understanding that the wheelchair is a 13 months rental item and paying for it will not be finished until 06/22/24  Background:   Past Medical History:  Diagnosis Date   Acute upper GI bleed 05/18/2016   Aneurysm of thoracic aorta    Ascending. 5.7 x 5.5cm - Bentall procedure   Aortic stenosis    Bioprosthetic AVR   Arthritis    Atrial fibrillation (HCC)    Documented on pacer interrogation 6/13   Complete heart block (HCC)    Status post pacemaker placement   Coronary atherosclerosis of native coronary artery    Multivessel - LIMA to LAD, SVG to OM, SVG to PDA   DJD (degenerative joint disease)    Esophageal dysmotility    Essential hypertension    Fibromyalgia    Gout    H/O arteriovenous malformation (AVM)    H/O hiatal hernia    Hemorrhoid    History of esophageal ulcer    History of skin cancer    Hx of adenomatous colonic polyps    Iron  deficiency anemia due to chronic blood loss    Melena    Pacemaker-Medtronic    PMR (polymyalgia rheumatica)    SIRS (systemic inflammatory response syndrome) (HCC) 01/16/2015    Assessment: Patient Reported Symptoms:  Cognitive Cognitive Status: No symptoms reported       Neurological Neurological Review of Symptoms: No symptoms reported Neurological Self-Management Outcome: 4 (good)  HEENT HEENT Symptoms Reported: No symptoms reported HEENT Self-Management Outcome: 4 (good)    Cardiovascular Cardiovascular Symptoms Reported: Fatigue Does patient have uncontrolled Hypertension?: No Cardiovascular Management Strategies: Routine screening Cardiovascular Self-Management Outcome: 4 (good)  Respiratory Respiratory Symptoms Reported: Productive cough    Endocrine Endocrine Symptoms Reported: Weakness or fatigue Endocrine Self-Management Outcome: 3 (uncertain)  Gastrointestinal Gastrointestinal Symptoms Reported: Constipation, Flatulence Gastrointestinal Management Strategies: Medication therapy Gastrointestinal Self-Management Outcome: 3 (uncertain)    Genitourinary Genitourinary Symptoms Reported: Urgency Genitourinary Management Strategies: Medication therapy Genitourinary Self-Management Outcome: 3 (uncertain)  Integumentary Integumentary Symptoms Reported: No symptoms reported Skin Self-Management Outcome: 4 (good)  Musculoskeletal Musculoskelatal Symptoms Reviewed: Limited mobility, Difficulty walking, Weakness Musculoskeletal Management Strategies: Routine screening, Medical device Musculoskeletal Self-Management Outcome: 3 (uncertain)      Psychosocial Psychosocial Symptoms Reported: Alteration in eating habits Additional Psychological Details: difficult swallowing Behavioral Management Strategies: Support system, Medication therapy Behavioral Health Self-Management Outcome: 3 (uncertain)   Quality of Family Relationships: helpful, supportive Do you feel physically threatened by others?: No      05/28/2024    6:03 PM  Depression screen PHQ 2/9  Decreased Interest 0  Down, Depressed, Hopeless 0  PHQ - 2 Score 0    There were no vitals filed for this visit.  Medications Reviewed Today  Medications were not reviewed in this encounter      Recommendation:   PCP Follow-up Continue Current Plan of Care  Follow Up Plan:   Telephone follow up appointment date/time:  08/28/24  Suzen L. Ramonita, RN, BSN, CCM [place]  [facility], [facility] RN Care Manager Direct Dial: [phone]  Fax: [phone]

## 2024-05-29 LAB — CUP PACEART REMOTE DEVICE CHECK
Battery Impedance: 3765 Ohm
Battery Remaining Longevity: 22 mo
Battery Voltage: 2.7 V
Brady Statistic RV Percent Paced: 100 %
Date Time Interrogation Session: 20250806102357
Implantable Lead Connection Status: 753985
Implantable Lead Connection Status: 753985
Implantable Lead Implant Date: 20130412
Implantable Lead Implant Date: 20130412
Implantable Lead Location: 753859
Implantable Lead Location: 753860
Implantable Lead Model: 5076
Implantable Lead Model: 5076
Implantable Pulse Generator Implant Date: 20130412
Lead Channel Impedance Value: 538 Ohm
Lead Channel Impedance Value: 67 Ohm
Lead Channel Pacing Threshold Amplitude: 0.875 V
Lead Channel Pacing Threshold Pulse Width: 0.4 ms
Lead Channel Setting Pacing Amplitude: 2 V
Lead Channel Setting Pacing Pulse Width: 0.4 ms
Lead Channel Setting Sensing Sensitivity: 4 mV
Zone Setting Status: 755011
Zone Setting Status: 755011

## 2024-05-30 ENCOUNTER — Ambulatory Visit: Payer: Self-pay | Admitting: Internal Medicine

## 2024-06-01 LAB — CUP PACEART REMOTE DEVICE CHECK
Battery Impedance: 3765 Ohm
Battery Remaining Longevity: 22 mo
Battery Voltage: 2.7 V
Brady Statistic RV Percent Paced: 100 %
Date Time Interrogation Session: 20250806102357
Implantable Lead Connection Status: 753985
Implantable Lead Connection Status: 753985
Implantable Lead Implant Date: 20130412
Implantable Lead Implant Date: 20130412
Implantable Lead Location: 753859
Implantable Lead Location: 753860
Implantable Lead Model: 5076
Implantable Lead Model: 5076
Implantable Pulse Generator Implant Date: 20130412
Lead Channel Impedance Value: 538 Ohm
Lead Channel Impedance Value: 67 Ohm
Lead Channel Pacing Threshold Amplitude: 0.875 V
Lead Channel Pacing Threshold Pulse Width: 0.4 ms
Lead Channel Setting Pacing Amplitude: 2 V
Lead Channel Setting Pacing Pulse Width: 0.4 ms
Lead Channel Setting Sensing Sensitivity: 4 mV
Zone Setting Status: 755011
Zone Setting Status: 755011

## 2024-06-04 DIAGNOSIS — M170 Bilateral primary osteoarthritis of knee: Secondary | ICD-10-CM | POA: Diagnosis not present

## 2024-06-09 DIAGNOSIS — I482 Chronic atrial fibrillation, unspecified: Secondary | ICD-10-CM | POA: Diagnosis not present

## 2024-06-09 DIAGNOSIS — I10 Essential (primary) hypertension: Secondary | ICD-10-CM | POA: Diagnosis not present

## 2024-06-09 DIAGNOSIS — I5032 Chronic diastolic (congestive) heart failure: Secondary | ICD-10-CM | POA: Diagnosis not present

## 2024-06-09 DIAGNOSIS — Z79899 Other long term (current) drug therapy: Secondary | ICD-10-CM | POA: Diagnosis not present

## 2024-06-09 DIAGNOSIS — M1000 Idiopathic gout, unspecified site: Secondary | ICD-10-CM | POA: Diagnosis not present

## 2024-06-09 DIAGNOSIS — D649 Anemia, unspecified: Secondary | ICD-10-CM | POA: Diagnosis not present

## 2024-06-09 DIAGNOSIS — R042 Hemoptysis: Secondary | ICD-10-CM | POA: Diagnosis not present

## 2024-06-09 DIAGNOSIS — E8721 Acute metabolic acidosis: Secondary | ICD-10-CM | POA: Diagnosis not present

## 2024-06-09 DIAGNOSIS — R634 Abnormal weight loss: Secondary | ICD-10-CM | POA: Diagnosis not present

## 2024-06-16 ENCOUNTER — Encounter: Payer: Self-pay | Admitting: Pulmonary Disease

## 2024-06-16 DIAGNOSIS — I2510 Atherosclerotic heart disease of native coronary artery without angina pectoris: Secondary | ICD-10-CM | POA: Diagnosis not present

## 2024-06-16 DIAGNOSIS — I5032 Chronic diastolic (congestive) heart failure: Secondary | ICD-10-CM | POA: Diagnosis not present

## 2024-06-16 DIAGNOSIS — I4811 Longstanding persistent atrial fibrillation: Secondary | ICD-10-CM | POA: Diagnosis not present

## 2024-06-16 DIAGNOSIS — I10 Essential (primary) hypertension: Secondary | ICD-10-CM | POA: Diagnosis not present

## 2024-06-16 DIAGNOSIS — I442 Atrioventricular block, complete: Secondary | ICD-10-CM | POA: Diagnosis not present

## 2024-06-16 DIAGNOSIS — D6869 Other thrombophilia: Secondary | ICD-10-CM | POA: Diagnosis not present

## 2024-06-16 DIAGNOSIS — I712 Thoracic aortic aneurysm, without rupture, unspecified: Secondary | ICD-10-CM | POA: Diagnosis not present

## 2024-06-16 DIAGNOSIS — N400 Enlarged prostate without lower urinary tract symptoms: Secondary | ICD-10-CM | POA: Diagnosis not present

## 2024-06-16 DIAGNOSIS — M1000 Idiopathic gout, unspecified site: Secondary | ICD-10-CM | POA: Diagnosis not present

## 2024-06-16 DIAGNOSIS — Z79899 Other long term (current) drug therapy: Secondary | ICD-10-CM | POA: Diagnosis not present

## 2024-06-16 DIAGNOSIS — Z952 Presence of prosthetic heart valve: Secondary | ICD-10-CM | POA: Diagnosis not present

## 2024-06-17 ENCOUNTER — Ambulatory Visit: Attending: Cardiology | Admitting: *Deleted

## 2024-06-17 DIAGNOSIS — Z952 Presence of prosthetic heart valve: Secondary | ICD-10-CM | POA: Insufficient documentation

## 2024-06-17 DIAGNOSIS — I480 Paroxysmal atrial fibrillation: Secondary | ICD-10-CM | POA: Diagnosis not present

## 2024-06-17 DIAGNOSIS — Z5181 Encounter for therapeutic drug level monitoring: Secondary | ICD-10-CM | POA: Diagnosis not present

## 2024-06-17 LAB — POCT INR: INR: 2.7 (ref 2.0–3.0)

## 2024-06-17 NOTE — Patient Instructions (Signed)
 Continue warfarin 1 tablet daily  Recheck in 5 wks Continue greens.

## 2024-06-23 ENCOUNTER — Ambulatory Visit: Payer: Self-pay

## 2024-06-23 NOTE — Telephone Encounter (Signed)
 FYI Only or Action Required?: FYI only for provider.  Patient is followed in Pulmonology for NA, last seen on NA.  Called Nurse Triage reporting Cough.  Symptoms began several months ago.  Interventions attempted: Nothing.  Symptoms are: unchanged.  Triage Disposition: See HCP Within 4 Hours (Or PCP Triage)  Patient/caregiver understands and will follow disposition?: Yes, pt will call PCP/UC. Notes he does not want to schedule NP appt as they are scheduling into October.            Copied from CRM [id number]. Topic: Clinical - Red Word Triage >> Jun 23, 2024  9:25 AM Douglas [name] wrote: Red Word that prompted transfer to Nurse Triage: patient states that he has constant cough with phlegm sometimes with yellow specks Reason for Disposition  [1] MILD difficulty breathing (e.g., minimal/no SOB at rest, SOB with walking, pulse < 100) AND [2] still present when not coughing  Answer Assessment - Initial Assessment Questions 1. ONSET: When did the cough begin?      Ongoing  3. SPUTUM: Describe the color of your sputum (e.g., none, dry cough; clear, white, yellow, green)     Yellow, intermittent 4. HEMOPTYSIS: Are you coughing up any blood? If Yes, ask: How much? (e.g., flecks, streaks, tablespoons, etc.)     None 5. DIFFICULTY BREATHING: Are you having difficulty breathing? If Yes, ask: How bad is it? (e.g., mild, moderate, severe)      Worsening from baseline 6. FEVER: Do you have [name] fever? If Yes, ask: What is your temperature, how was it measured, and when did it start?     None 10. OTHER SYMPTOMS: Do you have any other symptoms? (e.g., runny nose, wheezing, chest pain)       None  Protocols used: Cough - Acute Productive-[name]-AH

## 2024-06-26 ENCOUNTER — Other Ambulatory Visit: Payer: Self-pay

## 2024-06-26 MED ORDER — METOPROLOL SUCCINATE ER 25 MG PO TB24: 25.0000 mg | ORAL_TABLET | Freq: Every day | ORAL | 3 refills | Status: AC

## 2024-06-30 NOTE — Progress Notes (Signed)
 INR 2.7; Please see anticoagulation encounter

## 2024-07-01 ENCOUNTER — Encounter (INDEPENDENT_AMBULATORY_CARE_PROVIDER_SITE_OTHER): Payer: Self-pay | Admitting: Gastroenterology

## 2024-07-01 ENCOUNTER — Ambulatory Visit (INDEPENDENT_AMBULATORY_CARE_PROVIDER_SITE_OTHER): Admitting: Gastroenterology

## 2024-07-01 VITALS — BP 126/67 | HR 88 | Temp 98.6°F | Ht 65.0 in | Wt 173.9 lb

## 2024-07-01 DIAGNOSIS — L89309 Pressure ulcer of unspecified buttock, unspecified stage: Secondary | ICD-10-CM | POA: Diagnosis not present

## 2024-07-01 DIAGNOSIS — L89151 Pressure ulcer of sacral region, stage 1: Secondary | ICD-10-CM

## 2024-07-01 DIAGNOSIS — K5900 Constipation, unspecified: Secondary | ICD-10-CM

## 2024-07-01 DIAGNOSIS — R093 Abnormal sputum: Secondary | ICD-10-CM

## 2024-07-01 DIAGNOSIS — R0989 Other specified symptoms and signs involving the circulatory and respiratory systems: Secondary | ICD-10-CM | POA: Insufficient documentation

## 2024-07-01 DIAGNOSIS — R059 Cough, unspecified: Secondary | ICD-10-CM

## 2024-07-01 DIAGNOSIS — R1312 Dysphagia, oropharyngeal phase: Secondary | ICD-10-CM

## 2024-07-01 MED ORDER — BENZONATATE 100 MG PO CAPS: 100.0000 mg | ORAL_CAPSULE | Freq: Two times a day (BID) | ORAL | 0 refills | Status: DC | PRN

## 2024-07-01 NOTE — Progress Notes (Signed)
 Toribio Fortune, M.D. Gastroenterology & Hepatology [facility] Gastroenterology [address] [place], [place] [address]  Primary Care Physician: Sheryle Carwin, MD [address] [place] [address]  I will communicate my assessment and recommendations to the referring MD via EMR.  Problems: Constipation Hemorrhoids Pharyngoesophageal dysmotility Perianal pressure ulcer Frequent phlegm spitting   History of Present Illness: Douglas [name] is a 89 y.o. male PMH aortic stenosis and thoracic aortic aneurysm s/p Bental surgery, atrial fibrillation, coronary artery disease status post CABG, fibromyalgia, hypertension, gout, PMR and history of Enterobacter bacteremia,  who presents for follow up of frequent mucus spitting and perianal pressure ulcer.  The patient was last seen on 12/26/2023. At that time, the patient was advised to restart Linzess  145 mcg as needed for constipation.  A few days later the patient ended up readmitted to the hospital for worsening diarrhea, for which he was started on pancreatic enzymes and Imodium  as needed.  Patient reports that for the last 3-4 months he has had some worsening issues with recurrent phlegm sensation, and has been coughing more phlegm. He has this sensation slightly more frequnt after he eats food, but is present even if he does not eat. Reports having some issues with abdominal pain in his upper abdomen. Has had some occasional dysphagia to solids, most of the times food will go down but not every time. Notably, he has been recommended to follow with a pulmonologist but he culd not make the appointment.  3 years ago he was diagnosed with a possible perianal pressure ulcer. 2-3 weeks ago he noticed he had more discomfort around the area and has had some more bleeding.  Takes Linzess  almost almost every day, and has a BM each day with this.  The patient denies having any nausea, vomiting, fever, chills,  hematochezia, melena, hematemesis, abdominal distention, abdominal pain, diarrhea, jaundice, pruritus or weight loss.   Past Medical History: Past Medical History:  Diagnosis Date   Acute upper GI bleed 05/18/2016   Aneurysm of thoracic aorta (HCC)    Ascending. 5.7 x 5.5cm - Bentall procedure   Aortic stenosis    Bioprosthetic AVR   Arthritis    Atrial fibrillation (HCC)    Documented on pacer interrogation 6/13   Complete heart block (HCC)    Status post pacemaker placement   Coronary atherosclerosis of native coronary artery    Multivessel - LIMA to LAD, SVG to OM, SVG to PDA   DJD (degenerative joint disease)    Esophageal dysmotility    Essential hypertension    Fibromyalgia    Gout    H/O arteriovenous malformation (AVM)    H/O hiatal hernia    Hemorrhoid    History of esophageal ulcer    History of skin cancer    Hx of adenomatous colonic polyps    Iron  deficiency anemia due to chronic blood loss    Melena    Pacemaker-Medtronic    PMR (polymyalgia rheumatica) (HCC)    SIRS (systemic inflammatory response syndrome) (HCC) 01/16/2015    Past Surgical History: Past Surgical History:  Procedure Laterality Date   AORTIC VALVE REPLACEMENT  01/24/12   APPENDECTOMY     BACK SURGERY     1965 or 1966 lumbar back surgery   BENTALL PROCEDURE  01/24/2012   Procedure: BENTALL PROCEDURE;  Surgeon: Maude Fleeta Ochoa, MD;  Location: [facility] OR;  Service: Open Heart Surgery;  Laterality: N/A;   BILATERAL KNEE ARTHROSCOPY  CATARACT EXTRACTION W/PHACO Left 12/03/2016   Procedure: CATARACT EXTRACTION PHACO AND INTRAOCULAR LENS PLACEMENT LEFT EYE;  Surgeon: Cherene Mania, MD;  Location: [facility] ORS;  Service: Ophthalmology;  Laterality: Left;  CDE:  10.87   CATARACT EXTRACTION W/PHACO Right 12/13/2016   Procedure: CATARACT EXTRACTION PHACO AND INTRAOCULAR LENS PLACEMENT (IOC);  Surgeon: Cherene Mania, MD;  Location: [facility] ORS;  Service: Ophthalmology;  Laterality: Right;  right cde 11.78   CORONARY ARTERY  BYPASS GRAFT  01/24/2012   Procedure: CORONARY ARTERY BYPASS GRAFTING (CABG);  Surgeon: Maude Fleeta Ochoa, MD;  Location: [facility] OR;  Service: Open Heart Surgery;  Laterality: N/A;   ESOPHAGOGASTRODUODENOSCOPY  06/17/2012   Procedure: ESOPHAGOGASTRODUODENOSCOPY (EGD);  Surgeon: Claudis RAYMOND Rivet, MD;  Location: [facility] SUITE;  Service: Endoscopy;  Laterality: N/A;  ED   ESOPHAGOGASTRODUODENOSCOPY N/A 05/19/2016   Procedure: ESOPHAGOGASTRODUODENOSCOPY (EGD);  Surgeon: Margo LITTIE Haddock, MD;  Location: [facility];  Service: Endoscopy;  Laterality: N/A;   HEMORROIDECTOMY     HERNIA REPAIR     Left inguinal hernia repair     PACEMAKER PLACEMENT     Medtronic 4/13 - Dr. Kelsie   PERMANENT PACEMAKER INSERTION N/A 02/01/2012   Procedure: PERMANENT PACEMAKER INSERTION;  Surgeon: Lynwood Kelsie, MD; Medtronic Adapta L model ADDRL 1 (serial number [id number])     TRANSESOPHAGEAL ECHOCARDIOGRAM (CATH LAB) N/A 12/23/2023   Procedure: TRANSESOPHAGEAL ECHOCARDIOGRAM;  Surgeon: Lonni Slain, MD;  Location[facility] INVASIVE CV LAB;  Service: Cardiovascular;  Laterality: N/A;    Family History: Family History  Problem Relation Age of Onset   Kidney disease Mother    Heart disease Father    Anesthesia problems Neg Hx    Hypotension Neg Hx    Malignant hyperthermia Neg Hx    Pseudochol deficiency Neg Hx     Social History: Social History   Tobacco Use  Smoking Status Never  Smokeless Tobacco Never   Social History   Substance and Sexual Activity  Alcohol Use No   Alcohol/week: 0.0 standard drinks of alcohol   Social History   Substance and Sexual Activity  Drug Use No    Allergies: Allergies  Allergen Reactions   Imdur  [Isosorbide  Nitrate] Other (See Comments)    Nausea and lightheaded     Medications: Current Outpatient Medications  Medication Sig Dispense Refill   acetaminophen  (TYLENOL ) 325 MG tablet Take 2 tablets (650 mg total) by mouth every 6 (six) hours as needed for mild pain  (pain score 1-3) or fever.     allopurinol  (ZYLOPRIM ) 300 MG tablet Take 1 tablet (300 mg total) by mouth daily. Take 1  daily 30 tablet 0   furosemide  (LASIX ) 20 MG tablet Take 1 tablet (20 mg total) by mouth every other day. 15 tablet 0   levothyroxine (SYNTHROID) 50 MCG tablet Take 50 mcg by mouth every morning.     LINZESS  145 MCG CAPS capsule Take 145 mcg by mouth daily.     metoprolol  succinate (TOPROL -XL) 25 MG 24 hr tablet Take 1 tablet (25 mg total) by mouth daily. 90 tablet 3   pantoprazole  (PROTONIX ) 40 MG tablet Take 1 tablet (40 mg total) by mouth daily. 30 tablet 0   pravastatin  (PRAVACHOL ) 20 MG tablet Take 1 tablet (20 mg total) by mouth every evening. 30 tablet 0   warfarin (COUMADIN ) 5 MG tablet Take 0.5 tablets (2.5 mg total) by mouth every evening. Take 1 tablet by mouth on Monday, Wednesday, and Friday, and then 0.5 tablet by mouth daily  on all the other days (Patient taking differently: Take 5 mg by mouth every evening.) 60 tablet 0   No current facility-administered medications for this visit.    Review of Systems: GENERAL: negative for malaise, night sweats HEENT: No changes in hearing or vision, no nose bleeds or other nasal problems. NECK: Negative for lumps, goiter, pain and significant neck swelling RESPIRATORY: Negative for cough, wheezing CARDIOVASCULAR: Negative for chest pain, leg swelling, palpitations, orthopnea GI: SEE HPI MUSCULOSKELETAL: Negative for joint pain or swelling, back pain, and muscle pain. SKIN: Negative for lesions, rash PSYCH: Negative for sleep disturbance, mood disorder and recent psychosocial stressors. HEMATOLOGY Negative for prolonged bleeding, bruising easily, and swollen nodes. ENDOCRINE: Negative for cold or heat intolerance, polyuria, polydipsia and goiter. NEURO: negative for tremor, gait imbalance, syncope and seizures. The remainder of the review of systems is noncontributory.   Physical Exam: BP 126/67 (BP Location: Left  Arm, Patient Position: Sitting, Cuff Size: Normal)   Pulse 88   Temp 98.6 F (37 C) (Temporal)   Ht 5' 5 (1.651 m)   Wt 173 lb 14.4 oz (78.9 kg)   BMI 28.94 kg/m  GENERAL: The patient is AO x3, in no acute distress. HEENT: Head is normocephalic and atraumatic. EOMI are intact. Mouth is well hydrated and without lesions. NECK: Supple. No masses LUNGS: Clear to auscultation. No presence of rhonchi/wheezing/rales. Adequate chest expansion HEART: RRR, normal s1 and s2. ABDOMEN: Soft, nontender, no guarding, no peritoneal signs, and nondistended. BS +. No masses. RECTAL EXAM: Presence of stage II pressure ulcer measuring close to 5 mm in the left buttock EXTREMITIES: Without any cyanosis, clubbing, rash, lesions or edema. NEUROLOGIC: AOx3, no focal motor deficit. SKIN: no jaundice, no rashes   Imaging/Labs: as above  I personally reviewed and interpreted the available labs, imaging and endoscopic files.  Impression and Plan: Douglas [name] is a 89 y.o. male PMH aortic stenosis and thoracic aortic aneurysm s/p Bental surgery, atrial fibrillation, coronary artery disease status post CABG, fibromyalgia, hypertension, gout, PMR and history of Enterobacter bacteremia,  who presents for follow up of frequent mucus spitting and perianal pressure ulcer.  The patient has presented frequent phlegm production and cough episodes.  In my opinion, this appears to be a respiratory issue primarily rather than gastrointestinal.  He is already on pantoprazole  without significant GERD related symptoms and he is having minimal changes in his symptoms while on pantoprazole  daily.  I discussed with the patient and his son that ideally this should be evaluated further by a pulmonologist.  In fact he had an appointment but could not make it.  I encouraged the patient and his son to follow with PCP closely and request and repeat referral to pulmonology.  I will send some benzonatate  for now to decrease his  symptoms.  Regarding his lesion in the perianal area, this appears to be a pressure ulcer.  There is no active drainage from it.  I will refer him to wound care nurse.  He she will continue using a donut cushion to sit.  Finally, constipation has been well-controlled with his current doses of Linzess , which he should continue taking on a daily basis.  -Please reach back again PCP regarding referral to pulmonology -Take benzonatate  100 mg 3 times daily as needed -Referral to wound care nurse - Continue Linzess  145 mcg daily  All questions were answered.      Toribio Fortune, MD Gastroenterology and Hepatology [facility] Gastroenterology

## 2024-07-01 NOTE — Patient Instructions (Addendum)
 Please reach back again PCP regarding referral to pulmonology Take benzonatate  100 mg 3 times daily as needed Referral to wound care nurse

## 2024-07-14 ENCOUNTER — Encounter (HOSPITAL_BASED_OUTPATIENT_CLINIC_OR_DEPARTMENT_OTHER): Attending: Internal Medicine | Admitting: Internal Medicine

## 2024-07-14 DIAGNOSIS — Z950 Presence of cardiac pacemaker: Secondary | ICD-10-CM | POA: Diagnosis not present

## 2024-07-14 DIAGNOSIS — Z952 Presence of prosthetic heart valve: Secondary | ICD-10-CM | POA: Insufficient documentation

## 2024-07-14 DIAGNOSIS — L89322 Pressure ulcer of left buttock, stage 2: Secondary | ICD-10-CM | POA: Insufficient documentation

## 2024-07-14 DIAGNOSIS — I5022 Chronic systolic (congestive) heart failure: Secondary | ICD-10-CM | POA: Diagnosis not present

## 2024-07-16 ENCOUNTER — Ambulatory Visit (INDEPENDENT_AMBULATORY_CARE_PROVIDER_SITE_OTHER): Admitting: Gastroenterology

## 2024-07-20 NOTE — Progress Notes (Signed)
 Remote PPM Transmission

## 2024-07-22 ENCOUNTER — Ambulatory Visit: Attending: Cardiology | Admitting: *Deleted

## 2024-07-22 DIAGNOSIS — I480 Paroxysmal atrial fibrillation: Secondary | ICD-10-CM | POA: Diagnosis not present

## 2024-07-22 DIAGNOSIS — Z5181 Encounter for therapeutic drug level monitoring: Secondary | ICD-10-CM | POA: Diagnosis not present

## 2024-07-22 DIAGNOSIS — Z952 Presence of prosthetic heart valve: Secondary | ICD-10-CM | POA: Insufficient documentation

## 2024-07-22 LAB — POCT INR: INR: 2.8 (ref 2.0–3.0)

## 2024-07-22 NOTE — Progress Notes (Signed)
 INR-2.8 Please see anticoagulation encounter

## 2024-07-22 NOTE — Patient Instructions (Signed)
 Continue warfarin 1 tablet daily  Recheck in 6 wks Continue greens.

## 2024-07-23 ENCOUNTER — Ambulatory Visit (INDEPENDENT_AMBULATORY_CARE_PROVIDER_SITE_OTHER): Admitting: Internal Medicine

## 2024-07-23 ENCOUNTER — Encounter: Payer: Self-pay | Admitting: Pulmonary Disease

## 2024-07-23 ENCOUNTER — Ambulatory Visit (HOSPITAL_COMMUNITY)
Admission: RE | Admit: 2024-07-23 | Discharge: 2024-07-23 | Disposition: A | Discharge status: Home / Self Care | Source: Ambulatory Visit | Attending: Pulmonary Disease | Admitting: Pulmonary Disease

## 2024-07-23 ENCOUNTER — Other Ambulatory Visit: Payer: Self-pay

## 2024-07-23 ENCOUNTER — Telehealth: Payer: Self-pay | Admitting: Cardiology

## 2024-07-23 ENCOUNTER — Ambulatory Visit (INDEPENDENT_AMBULATORY_CARE_PROVIDER_SITE_OTHER): Admitting: Pulmonary Disease

## 2024-07-23 ENCOUNTER — Encounter: Payer: Self-pay | Admitting: Internal Medicine

## 2024-07-23 VITALS — BP 130/62 | HR 74 | Ht 65.0 in | Wt 176.7 lb

## 2024-07-23 VITALS — BP 134/63 | HR 74 | Ht 65.0 in | Wt 177.6 lb

## 2024-07-23 DIAGNOSIS — R042 Hemoptysis: Secondary | ICD-10-CM | POA: Diagnosis not present

## 2024-07-23 DIAGNOSIS — I5042 Chronic combined systolic (congestive) and diastolic (congestive) heart failure: Secondary | ICD-10-CM

## 2024-07-23 DIAGNOSIS — Z950 Presence of cardiac pacemaker: Secondary | ICD-10-CM | POA: Diagnosis not present

## 2024-07-23 DIAGNOSIS — R059 Cough, unspecified: Secondary | ICD-10-CM | POA: Diagnosis not present

## 2024-07-23 DIAGNOSIS — I442 Atrioventricular block, complete: Secondary | ICD-10-CM | POA: Diagnosis not present

## 2024-07-23 DIAGNOSIS — Z951 Presence of aortocoronary bypass graft: Secondary | ICD-10-CM | POA: Diagnosis not present

## 2024-07-23 DIAGNOSIS — R918 Other nonspecific abnormal finding of lung field: Secondary | ICD-10-CM | POA: Diagnosis not present

## 2024-07-23 LAB — CUP PACEART INCLINIC DEVICE CHECK
Battery Impedance: 3866 Ohm
Battery Remaining Longevity: 21 mo
Battery Voltage: 2.68 V
Brady Statistic RV Percent Paced: 100 %
Date Time Interrogation Session: 20251002101138
Implantable Lead Connection Status: 753985
Implantable Lead Connection Status: 753985
Implantable Lead Implant Date: 20130412
Implantable Lead Implant Date: 20130412
Implantable Lead Location: 753859
Implantable Lead Location: 753860
Implantable Lead Model: 5076
Implantable Lead Model: 5076
Implantable Pulse Generator Implant Date: 20130412
Lead Channel Impedance Value: 509 Ohm
Lead Channel Impedance Value: 67 Ohm
Lead Channel Pacing Threshold Amplitude: 0.875 V
Lead Channel Pacing Threshold Pulse Width: 0.4 ms
Lead Channel Setting Pacing Amplitude: 2 V
Lead Channel Setting Pacing Pulse Width: 0.4 ms
Lead Channel Setting Sensing Sensitivity: 4 mV
Zone Setting Status: 755011
Zone Setting Status: 755011

## 2024-07-23 MED ORDER — WARFARIN SODIUM 5 MG PO TABS: 2.5000 mg | ORAL_TABLET | Freq: Every evening | ORAL | 0 refills | Status: DC

## 2024-07-23 NOTE — Progress Notes (Signed)
 HPI Douglas [name] returns today for followup. He has a h/o Bental/AVR and CHB, s/p PPM insertion several years ago. He has remained on coumadin  therapy. He denies chest pain, sob, or syncope. Very mild peripheral edema. No bleeding on coumadin . He has class 2 dyspnea. He has class 2 peripheral edema.  he continues to do well. He admits to slowing down. He is no longer driving or using his tractor.  Allergies  Allergen Reactions   Imdur  [Isosorbide  Nitrate] Other (See Comments)    Nausea and lightheaded      Current Outpatient Medications  Medication Sig Dispense Refill   acetaminophen  (TYLENOL ) 325 MG tablet Take 2 tablets (650 mg total) by mouth every 6 (six) hours as needed for mild pain (pain score 1-3) or fever.     allopurinol  (ZYLOPRIM ) 300 MG tablet Take 1 tablet (300 mg total) by mouth daily. Take 1  daily 30 tablet 0   furosemide  (LASIX ) 20 MG tablet Take 1 tablet (20 mg total) by mouth every other day. 15 tablet 0   levothyroxine (SYNTHROID) 50 MCG tablet Take 50 mcg by mouth every morning.     LINZESS  145 MCG CAPS capsule Take 145 mcg by mouth daily.     metoprolol  succinate (TOPROL -XL) 25 MG 24 hr tablet Take 1 tablet (25 mg total) by mouth daily. 90 tablet 3   pantoprazole  (PROTONIX ) 40 MG tablet Take 1 tablet (40 mg total) by mouth daily. 30 tablet 0   pravastatin  (PRAVACHOL ) 20 MG tablet Take 1 tablet (20 mg total) by mouth every evening. 30 tablet 0   warfarin (COUMADIN ) 5 MG tablet Take 0.5 tablets (2.5 mg total) by mouth every evening. Take 1 tablet by mouth on Monday, Wednesday, and Friday, and then 0.5 tablet by mouth daily on all the other days 60 tablet 0   No current facility-administered medications for this visit.     Past Medical History:  Diagnosis Date   Acute upper GI bleed 05/18/2016   Aneurysm of thoracic aorta    Ascending. 5.7 x 5.5cm - Bentall procedure   Aortic stenosis    Bioprosthetic AVR   Arthritis    Atrial fibrillation (HCC)     Documented on pacer interrogation 6/13   Complete heart block (HCC)    Status post pacemaker placement   Coronary atherosclerosis of native coronary artery    Multivessel - LIMA to LAD, SVG to OM, SVG to PDA   DJD (degenerative joint disease)    Esophageal dysmotility    Essential hypertension    Fibromyalgia    Gout    H/O arteriovenous malformation (AVM)    H/O hiatal hernia    Hemorrhoid    History of esophageal ulcer    History of skin cancer    Hx of adenomatous colonic polyps    Iron  deficiency anemia due to chronic blood loss    Melena    Pacemaker-Medtronic    PMR (polymyalgia rheumatica)    SIRS (systemic inflammatory response syndrome) (HCC) 01/16/2015    ROS:   All systems reviewed and negative except as noted in the HPI.   Past Surgical History:  Procedure Laterality Date   AORTIC VALVE REPLACEMENT  01/24/12   APPENDECTOMY     BACK SURGERY     1965 or 1966 lumbar back surgery   BENTALL PROCEDURE  01/24/2012   Procedure: BENTALL PROCEDURE;  Surgeon: Maude Fleeta Ochoa, MD;  Location: [facility] OR;  Service: Open Heart Surgery;  Laterality: N/A;  BILATERAL KNEE ARTHROSCOPY     CATARACT EXTRACTION W/PHACO Left 12/03/2016   Procedure: CATARACT EXTRACTION PHACO AND INTRAOCULAR LENS PLACEMENT LEFT EYE;  Surgeon: Cherene Mania, MD;  Location: [facility];  Service: Ophthalmology;  Laterality: Left;  CDE:  10.87   CATARACT EXTRACTION W/PHACO Right 12/13/2016   Procedure: CATARACT EXTRACTION PHACO AND INTRAOCULAR LENS PLACEMENT (IOC);  Surgeon: Cherene Mania, MD;  Location: [facility];  Service: Ophthalmology;  Laterality: Right;  right cde 11.78   CORONARY ARTERY BYPASS GRAFT  01/24/2012   Procedure: CORONARY ARTERY BYPASS GRAFTING (CABG);  Surgeon: Maude Fleeta Ochoa, MD;  Location: [facility] OR;  Service: Open Heart Surgery;  Laterality: N/A;   ESOPHAGOGASTRODUODENOSCOPY  06/17/2012   Procedure: ESOPHAGOGASTRODUODENOSCOPY (EGD);  Surgeon: Claudis RAYMOND Rivet, MD;  Location: [facility];  Service: Endoscopy;   Laterality: N/A;  ED   ESOPHAGOGASTRODUODENOSCOPY N/A 05/19/2016   Procedure: ESOPHAGOGASTRODUODENOSCOPY (EGD);  Surgeon: Margo LITTIE Haddock, MD;  Location: [facility];  Service: Endoscopy;  Laterality: N/A;   HEMORROIDECTOMY     HERNIA REPAIR     Left inguinal hernia repair     PACEMAKER PLACEMENT     Medtronic 4/13 - Dr. Kelsie   PERMANENT PACEMAKER INSERTION N/A 02/01/2012   Procedure: PERMANENT PACEMAKER INSERTION;  Surgeon: Lynwood Kelsie, MD; Medtronic Adapta L model ADDRL 1 (serial number [id number])     TRANSESOPHAGEAL ECHOCARDIOGRAM (CATH LAB) N/A 12/23/2023   Procedure: TRANSESOPHAGEAL ECHOCARDIOGRAM;  Surgeon: Lonni Slain, MD;  Location: [facility] INVASIVE CV LAB;  Service: Cardiovascular;  Laterality: N/A;     Family History  Problem Relation Age of Onset   Kidney disease Mother    Heart disease Father    Anesthesia problems Neg Hx    Hypotension Neg Hx    Malignant hyperthermia Neg Hx    Pseudochol deficiency Neg Hx      Social History   Socioeconomic History   Marital status: Married    Spouse name: Vernell   Number of children: 4   Years of education: Not on file   Highest education level: Not on file  Occupational History   Occupation: Retired    Comment: Engineer, maintenance  Tobacco Use   Smoking status: Never   Smokeless tobacco: Never  Vaping Use   Vaping status: Never Used  Substance and Sexual Activity   Alcohol use: No    Alcohol/week: 0.0 standard drinks of alcohol   Drug use: No   Sexual activity: Never    Birth control/protection: None  Other Topics Concern   Not on file  Social History Narrative   Lives with wife, has been married since 27.   Social Drivers of Corporate investment banker Strain: Low Risk  (02/06/2024)   Overall Financial Resource Strain (CARDIA)    Difficulty of Paying Living Expenses: Not hard at all  Food Insecurity: No Food Insecurity (03/26/2024)   Hunger Vital Sign    Worried About Running Out of Food in the Last Year:  Never true    Ran Out of Food in the Last Year: Never true  Transportation Needs: No Transportation Needs (02/06/2024)   PRAPARE - Administrator, Civil Service (Medical): No    Lack of Transportation (Non-Medical): No  Physical Activity: Inactive (02/06/2024)   Exercise Vital Sign    Days of Exercise per Week: 0 days    Minutes of Exercise per Session: 10 min  Stress: No Stress Concern Present (02/06/2024)   [employer] of Occupational Health - Occupational Stress  Questionnaire    Feeling of Stress : Not at all  Social Connections: Moderately Integrated (02/06/2024)   Social Connection and Isolation Panel    Frequency of Communication with Friends and Family: Twice a week    Frequency of Social Gatherings with Friends and Family: Twice a week    Attends Religious Services: 1 to 4 times per year    Active Member of [employer] or Organizations: No    Attends Banker Meetings: Never    Marital Status: Married  Recent Concern: Social Connections - Moderately Isolated (01/10/2024)   Social Connection and Isolation Panel    Frequency of Communication with Friends and Family: Never    Frequency of Social Gatherings with Friends and Family: Once a week    Attends Religious Services: 1 to 4 times per year    Active Member of [employer] or Organizations: No    Attends Banker Meetings: Never    Marital Status: Married  Catering manager Violence: Not At Risk (02/06/2024)   Humiliation, Afraid, Rape, and Kick questionnaire    Fear of Current or Ex-Partner: No    Emotionally Abused: No    Physically Abused: No    Sexually Abused: No     BP 130/62   Pulse 74   Ht 5' 5 (1.651 m)   Wt 176 lb 11.2 oz (80.2 kg)   SpO2 100%   BMI 29.40 kg/m   Physical Exam:  Well appearing NAD HEENT: Unremarkable Neck:  No JVD, no thyromegally Lymphatics:  No adenopathy Back:  No CVA tenderness Lungs:  Clear HEART:  Regular rate rhythm, no murmurs, no rubs, no  clicks Abd:  soft, positive bowel sounds, no organomegally, no rebound, no guarding Ext:  2 plus pulses, no edema, no cyanosis, no clubbing Skin:  No rashes no nodules Neuro:  CN II through XII intact, motor grossly intact  DEVICE  Normal device function.  See PaceArt for details.   Assess/Plan:  CHB - he is asymptomatic s/p PPM insertion. PPM -his medtronic DDD PM is working normally but he has been reprogrammed VVIR as he was undersensing some of his atrial fib. Chronic diastolic heart failure - his symptoms are class 2.  Atrial fib - his VR is controlled. He will continue his beta blocker.   Danelle Macintyre Alexa,MD

## 2024-07-23 NOTE — Telephone Encounter (Signed)
*  STAT* If patient is at the pharmacy, call can be transferred to refill team.   1. Which medications need to be refilled? (please list name of each medication and dose if known)  warfarin (COUMADIN ) 5 MG tablet   2. Which pharmacy/location (including street and city if local pharmacy) is medication to be sent to? [facility] - [place], [place] - [id number]    3. Do they need a 30 day or 90 day supply?  90 day supply

## 2024-07-23 NOTE — Patient Instructions (Signed)

## 2024-07-23 NOTE — Patient Instructions (Addendum)
-   Get CXR done. - Can come back as needed if your symptoms come back like blood in the phlegm

## 2024-07-23 NOTE — Progress Notes (Signed)
 New Patient Pulmonology Office Visit   Subjective:  Patient ID: Douglas [name], male    DOB: October 01, 1928  MRN: [id number]  Referred by: Sheryle Carwin, MD  CC:  Chief Complaint  Patient presents with   Establish Care    Hemoptysis once - then coughing dark mucus - now clear phlem    HPI Douglas [name] is a 89 y.o. male with PMH significant for atrial fibrillation on coumadin , multivessel CAD s/p CABG, HFmrEF who presents for initial evaluation of hemoptysis.  Hemoptysis started during July. Dr. Faggan recommended seeing pulmonology for hemoptysis. He spit up some blood 1 time. Since then, he gets a lot of phlegm and spits up yellow brownish sputum. Last week or two, no phlegm production. Last time he spit up blood was in [place]. He always gets some phlegm especially after he eats. Sometimes at night. Coughing up that. No reflux. It is hard for him to swallow pills and it feels like sometimes it gets stuck. Apple sauce helps with getting pills down. He is not very short of breath. No wheezing. No chest pain. No fevers, chills, or night sweats. Lost weight - 30 lbs. He does not have a good appetite. He has a mouth sore. He had nose bleed that was severe enough for him to go to the ED to stop it. Saw ENT afterwards who did a laryngoscopy in April and did not find any upper airway pathology.  Admitted with pneumonia and sepsis 12/2023  PMH:  - Atrial Fibrillation - CAD s/p CABG - HFrmEF - Dysphagia  PSH: not contributory  Important Medications:  - Coumadin , INR 2.8  Allergies: reviewed in chart  Social Hx: - Smoking NEVER  - Second hand smoking None  - Vaping None  - Alcohol None  - Illicit substances None  - Indoor emission from household combustion: wood burning stove, outdoors  - Presenter, broadcasting (e.g. wood work, Surveyor, quantity, bird breeding etc...)  - Landscape architect (e.g. 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 Take 1  daily, Disp: 30 tablet, Rfl: 0   levothyroxine (SYNTHROID) 50 MCG tablet, Take 50 mcg by mouth every morning., Disp: , Rfl:    LINZESS  145 MCG CAPS capsule, Take 145 mcg by mouth daily., Disp: , Rfl:    metoprolol  succinate (TOPROL -XL) 25 MG 24 hr tablet, Take 1 tablet (25 mg total) by mouth daily., Disp: 90 tablet, Rfl: 3   pantoprazole  (PROTONIX ) 40 MG tablet, Take 1 tablet (40 mg total) by mouth daily., Disp: 30 tablet, Rfl: 0   pravastatin  (PRAVACHOL ) 20 MG tablet, Take 1 tablet (20 mg total) by mouth every evening., Disp: 30 tablet, Rfl: 0   furosemide  (LASIX ) 20 MG tablet, Take 1 tablet (20 mg total) by mouth every other day. (Patient not taking: Reported on 07/23/2024), Disp: 15 tablet, Rfl: 0   warfarin (COUMADIN ) 5 MG tablet, Take 0.5 tablets (2.5 mg total) by mouth every evening. Take 1 tablet by mouth on Monday, Wednesday, and Friday, and then 0.5 tablet by  mouth daily on all the other days (Patient not taking: Reported on 07/23/2024), Disp: 90 tablet, Rfl: 0 Past Medical History:  Diagnosis Date   Acute upper GI bleed 05/18/2016   Aneurysm of thoracic aorta    Ascending. 5.7 x 5.5cm - Bentall procedure   Aortic stenosis    Bioprosthetic AVR   Arthritis    Atrial fibrillation (HCC)    Documented on pacer interrogation 6/13   Complete heart block (HCC)    Status post pacemaker placement   Coronary atherosclerosis of native coronary artery    Multivessel - LIMA to LAD, SVG to OM, SVG to PDA   DJD (degenerative joint disease)    Esophageal dysmotility    Essential hypertension    Fibromyalgia    Gout    H/O arteriovenous  malformation (AVM)    H/O hiatal hernia    Hemorrhoid    History of esophageal ulcer    History of skin cancer    Hx of adenomatous colonic polyps    Iron  deficiency anemia due to chronic blood loss    Melena    Pacemaker-Medtronic    PMR (polymyalgia rheumatica)    SIRS (systemic inflammatory response syndrome) (HCC) 01/16/2015   Past Surgical History:  Procedure Laterality Date   AORTIC VALVE REPLACEMENT  01/24/12   APPENDECTOMY     BACK SURGERY     1965 or 1966 lumbar back surgery   BENTALL PROCEDURE  01/24/2012   Procedure: BENTALL PROCEDURE;  Surgeon: Maude Fleeta Ochoa, MD;  Location[facility] OR;  Service: Open Heart Surgery;  Laterality: N/A;   BILATERAL KNEE ARTHROSCOPY     CATARACT EXTRACTION W/PHACO Left 12/03/2016   Procedure: CATARACT EXTRACTION PHACO AND INTRAOCULAR LENS PLACEMENT LEFT EYE;  Surgeon: Cherene Mania, MD;  Location: [facility];  Service: Ophthalmology;  Laterality: Left;  CDE:  10.87   CATARACT EXTRACTION W/PHACO Right 12/13/2016   Procedure: CATARACT EXTRACTION PHACO AND INTRAOCULAR LENS PLACEMENT (IOC);  Surgeon: Cherene Mania, MD;  Location: [facility];  Service: Ophthalmology;  Laterality: Right;  right cde 11.78   CORONARY ARTERY BYPASS GRAFT  01/24/2012   Procedure: CORONARY ARTERY BYPASS GRAFTING (CABG);  Surgeon: Maude Fleeta Ochoa, MD;  Location: [facility] OR;  Service: Open Heart Surgery;  Laterality: N/A;   ESOPHAGOGASTRODUODENOSCOPY  06/17/2012   Procedure: ESOPHAGOGASTRODUODENOSCOPY (EGD);  Surgeon: Claudis RAYMOND Rivet, MD;  [facility];  Service: Endoscopy;  Laterality: N/A;  ED   ESOPHAGOGASTRODUODENOSCOPY N/A 05/19/2016   Procedure: ESOPHAGOGASTRODUODENOSCOPY (EGD);  Surgeon: Margo LITTIE Haddock, MD;  Location: [facility];  Service: Endoscopy;  Laterality: N/A;   HEMORROIDECTOMY     HERNIA REPAIR     Left inguinal hernia repair     PACEMAKER PLACEMENT     Medtronic 4/13 - Dr. Kelsie   PERMANENT PACEMAKER INSERTION N/A 02/01/2012   Procedure: PERMANENT PACEMAKER INSERTION;   Surgeon: Lynwood Kelsie, MD; Medtronic Adapta L model ADDRL 1 (serial number [id number])     TRANSESOPHAGEAL ECHOCARDIOGRAM (CATH LAB) N/A 12/23/2023   Procedure: TRANSESOPHAGEAL ECHOCARDIOGRAM;  Surgeon: Lonni Slain, MD;  Location: [facility] INVASIVE CV LAB;  Service: Cardiovascular;  Laterality: N/A;   Family History  Problem Relation Age of Onset   Kidney disease Mother    Heart disease Father    Anesthesia problems Neg Hx    Hypotension Neg Hx    Malignant hyperthermia Neg Hx    Pseudochol deficiency Neg Hx    Social History   Socioeconomic History   Marital status: Married  Spouse name: Vernell   Number of children: 4   Years of education: Not on file   Highest education level: Not on file  Occupational History   Occupation: Retired    Comment: Engineer, maintenance  Tobacco Use   Smoking status: Never   Smokeless tobacco: Never  Vaping Use   Vaping status: Never Used  Substance and Sexual Activity   Alcohol use: No    Alcohol/week: 0.0 standard drinks of alcohol   Drug use: No   Sexual activity: Never    Birth control/protection: None  Other Topics Concern   Not on file  Social History Narrative   Lives with wife, has been married since 52.   Social Drivers of Corporate investment banker Strain: Low Risk  (02/06/2024)   Overall Financial Resource Strain (CARDIA)    Difficulty of Paying Living Expenses: Not hard at all  Food Insecurity: No Food Insecurity (03/26/2024)   Hunger Vital Sign    Worried About Running Out of Food in the Last Year: Never true    Ran Out of Food in the Last Year: Never true  Transportation Needs: No Transportation Needs (02/06/2024)   PRAPARE - Administrator, Civil Service (Medical): No    Lack of Transportation (Non-Medical): No  Physical Activity: Inactive (02/06/2024)   Exercise Vital Sign    Days of Exercise per Week: 0 days    Minutes of Exercise per Session: 10 min  Stress: No Stress Concern Present (02/06/2024)   [employer] of Occupational Health - Occupational Stress Questionnaire    Feeling of Stress : Not at all  Social Connections: Moderately Integrated (02/06/2024)   Social Connection and Isolation Panel    Frequency of Communication with Friends and Family: Twice a week    Frequency of Social Gatherings with Friends and Family: Twice a week    Attends Religious Services: 1 to 4 times per year    Active Member of [employer] or Organizations: No    Attends Banker Meetings: Never    Marital Status: Married  Recent Concern: Social Connections - Moderately Isolated (01/10/2024)   Social Connection and Isolation Panel    Frequency of Communication with Friends and Family: Never    Frequency of Social Gatherings with Friends and Family: Once a week    Attends Religious Services: 1 to 4 times per year    Active Member of [employer] or Organizations: No    Attends Banker Meetings: Never    Marital Status: Married  Catering manager Violence: Not At Risk (02/06/2024)   Humiliation, Afraid, Rape, and Kick questionnaire    Fear of Current or Ex-Partner: No    Emotionally Abused: No    Physically Abused: No    Sexually Abused: No       Objective:  BP 134/63   Pulse 74   Ht 5' 5 (1.651 m)   Wt 177 lb 9.6 oz (80.6 kg)   SpO2 98% Comment: ra  BMI 29.55 kg/m  Wt Readings from Last 3 Encounters:  07/23/24 177 lb 9.6 oz (80.6 kg)  07/23/24 176 lb 11.2 oz (80.2 kg)  07/01/24 173 lb 14.4 oz (78.9 kg)   BMI Readings from Last 3 Encounters:  07/23/24 29.55 kg/m  07/23/24 29.40 kg/m  07/01/24 28.94 kg/m   SpO2 Readings from Last 3 Encounters:  07/23/24 98%  07/23/24 100%  05/11/24 98%    Physical Exam General: NAD, alert, WD, WN Eyes: PERRL, no scleral icterus  ENMT: oropharynx clear, good dentition, no oral lesions, mallampati score II Skin: warm, intact, no rashes Neck: JVD elevated, ROM and lymph node assessment normal CV: RRR, systolic murmur, nl S1 and S2, 2+  pitting peripheral edema Resp: clear to auscultation bilaterally, no wheezes, rales, or rhonchi, normal effort, no clubbing/cyanosis Abdom: Normoactive bowel sounds, soft, nontender, nondistended, no hepatosplenomegaly Neuro: Awake alert oriented to person place time and situation  Diagnostic Review:  Last CBC Lab Results  Component Value Date   WBC 12.6 (H) 01/20/2024   HGB 9.8 (L) 01/20/2024   HCT 29.7 (L) 01/20/2024   MCV 89.7 01/20/2024   MCH 29.6 01/20/2024   RDW 16.8 (H) 01/20/2024   PLT 175 01/20/2024   Last metabolic panel Lab Results  Component Value Date   GLUCOSE 75 01/20/2024   NA 138 01/20/2024   K 4.4 01/20/2024   CL 111 01/20/2024   CO2 19 (L) 01/20/2024   BUN 33 (H) 01/20/2024   CREATININE 1.23 01/20/2024   GFRNONAA 54 (L) 01/20/2024   CALCIUM  8.0 (L) 01/20/2024   PHOS 3.2 01/16/2024   PROT 5.9 (L) 01/10/2024   ALBUMIN  2.1 (L) 01/16/2024   LABGLOB 3.0 08/29/2023   AGRATIO 1.1 10/03/2016   BILITOT 0.6 01/10/2024   ALKPHOS 100 01/10/2024   AST 14 (L) 01/10/2024   ALT 10 01/10/2024   ANIONGAP 8 01/20/2024    CT chest 04/23/2024: IMPRESSION: 1. Stable appearance status post aortic valve replacement and aortic root replacement. There is aneurysmal disease of the native distal ascending thoracic aorta above the aortic root graft measuring up to approximately 4.7 cm. This appears to have increased slightly since 2022 where the same segment measured approximately 4.5 cm. Ascending thoracic aortic aneurysm. Recommend semi-annual imaging followup by CTA or MRA and referral to cardiothoracic surgery if not already obtained. This recommendation follows 2010 ACCF/AHA/AATS/ACR/ASA/SCA/SCAI/SIR/STS/SVM Guidelines for the Diagnosis and Management of Patients With Thoracic Aortic Disease. Circulation. 2010; 121: z733-z630 2. Stable cardiac enlargement, diffuse coronary atherosclerosis and pacemaker lead positioning. 3. Stable dilated central pulmonary arteries  with the main pulmonary artery measuring up to approximately 3.8 cm. 4. Stable small scattered pulmonary nodules since 2022. These are benign based on stability over several years. 5. Posterior pleural thickening in the lower left hemithorax is slightly more prominent towards the lung base. Associated scarring and architectural distortion in the left lower lobe again noted.  PFTs 01/2012: borderline obstruction.    Assessment & Plan:   Assessment & Plan Hemoptysis The patient had one episode of hemoptysis in July and then coughed up dark sputum for a few weeks afterwards. He is not having any hemoptysis at this time and his cough has dramatically improved. The patient is on a blood thinner and has had epistaxis in the past that required ED visit. Interestingly, INR was subtherapeutic around July and so the coumadin  would not explain the hemoptysis. Differential diagnosis include epistaxis, pulmonary edema, pulmonary embolism, bronchitis, pneumonia, lung parenchymal lesion. PCP appropriately ordered dry CT chest since patient is already on long term anticoagulation. I reviewed it and there's no parenchymal lung lesions, pneumonia, or pulmonary infarcts. PE is not ruled out given lack of contrast but patient is already on anticoagulation and there's no pulmonary infarcts. The patient decided to take himself off lasix  in the last month and appears hypervolemic on examination so pulmonary edema is a possibility. I will obtain a repeat CXR just to make sure there's nothing new going on. Given that his symptoms has resolved, I will  not go further than that. I advised patient to call us  back if hemoptysis recurs and at that point, we could weigh the risk and benefit of going through a fiberoptic bronchoscopy to better evaluate. For now the risks outweigh the benefits. Chronic combined systolic and diastolic congestive heart failure (HCC) I advised patient to go back on lasix  as directed by ordering provider.  The patient agrees given significant lower extremity edema.  Orders Placed This Encounter  Procedures   DG Chest 2 View   I spent 45 minutes reviewing patient's chart including prior consultant notes, imaging, and PFTs as well as face-to-face with the patient, over half in discussion of the diagnosis and the importance of compliance with the treatment plan.  No follow-ups on file.   Gergory Biello, MD

## 2024-07-28 ENCOUNTER — Encounter (HOSPITAL_BASED_OUTPATIENT_CLINIC_OR_DEPARTMENT_OTHER): Attending: Internal Medicine | Admitting: Internal Medicine

## 2024-07-28 ENCOUNTER — Ambulatory Visit (HOSPITAL_BASED_OUTPATIENT_CLINIC_OR_DEPARTMENT_OTHER): Admitting: Internal Medicine

## 2024-07-28 DIAGNOSIS — Z23 Encounter for immunization: Secondary | ICD-10-CM | POA: Diagnosis not present

## 2024-07-28 DIAGNOSIS — Z09 Encounter for follow-up examination after completed treatment for conditions other than malignant neoplasm: Secondary | ICD-10-CM | POA: Insufficient documentation

## 2024-07-28 DIAGNOSIS — L89322 Pressure ulcer of left buttock, stage 2: Secondary | ICD-10-CM | POA: Diagnosis not present

## 2024-07-29 ENCOUNTER — Ambulatory Visit (HOSPITAL_BASED_OUTPATIENT_CLINIC_OR_DEPARTMENT_OTHER): Payer: Self-pay | Admitting: Pulmonary Disease

## 2024-07-29 NOTE — Progress Notes (Signed)
 Please inform patient that his CXR looks good. No change from prior.

## 2024-07-30 NOTE — Progress Notes (Signed)
 Called and spoke with pt regarding results, pt confirmed understanding

## 2024-07-30 NOTE — Patient Instructions (Signed)
 Visit Information  Thank you for taking time to visit with me today. Please don't hesitate to contact me if I can be of assistance to you before our next scheduled appointment.  Your next care management appointment is by telephone on 08/28/24 at 1100   Please call the care guide team at [phone] if you need to cancel, schedule, or reschedule an appointment.   Please call the Suicide and Crisis Lifeline: 988 call the [place]  National Suicide Prevention Lifeline: 8432339308 or TTY: 726-865-9730 TTY 6137279069) to talk to a trained counselor call 1-800-273-TALK (toll free, 24 hour hotline) call the [facility]: [phone] call 911 if you are experiencing a Mental Health or Behavioral Health Crisis or need someone to talk to.  [name], RN, BSN, CCM [place]  [facility], [facility] RN Care Manager Direct Dial: [phone]  Fax: [phone]

## 2024-07-31 NOTE — Telephone Encounter (Signed)
 Copied from CRM #[id number]. Topic: Clinical - Lab/Test Results >> Jul 29, 2024 12:18 PM Whitney O wrote: Reason for CRM: patient is calling because he had a chest xray done on the second and are wanting to know about results [id number]  See phone note

## 2024-08-03 ENCOUNTER — Ambulatory Visit: Admitting: Internal Medicine

## 2024-08-05 ENCOUNTER — Encounter (INDEPENDENT_AMBULATORY_CARE_PROVIDER_SITE_OTHER): Payer: Self-pay | Admitting: Gastroenterology

## 2024-08-06 ENCOUNTER — Encounter: Admitting: Internal Medicine

## 2024-08-17 ENCOUNTER — Encounter (INDEPENDENT_AMBULATORY_CARE_PROVIDER_SITE_OTHER): Payer: Self-pay | Admitting: Gastroenterology

## 2024-08-17 ENCOUNTER — Ambulatory Visit (INDEPENDENT_AMBULATORY_CARE_PROVIDER_SITE_OTHER): Admitting: Gastroenterology

## 2024-08-17 VITALS — BP 127/64 | HR 80 | Temp 97.2°F | Ht 65.0 in | Wt 180.5 lb

## 2024-08-17 DIAGNOSIS — K219 Gastro-esophageal reflux disease without esophagitis: Secondary | ICD-10-CM

## 2024-08-17 DIAGNOSIS — K5900 Constipation, unspecified: Secondary | ICD-10-CM

## 2024-08-17 DIAGNOSIS — R1013 Epigastric pain: Secondary | ICD-10-CM | POA: Insufficient documentation

## 2024-08-17 DIAGNOSIS — K6289 Other specified diseases of anus and rectum: Secondary | ICD-10-CM | POA: Diagnosis not present

## 2024-08-17 MED ORDER — SUCRALFATE 1 GM/10ML PO SUSP: 1.0000 g | Freq: Four times a day (QID) | ORAL | 1 refills | Status: AC

## 2024-08-17 NOTE — Patient Instructions (Addendum)
 Continue linzess  daily Continue good water  intake and fiber as you are doing Let me know if you have any recurrence of rectal pain Continue protonix  40mg  daily I am sending carafate to help with your upper abdominal discomfort   It was a pleasure to see you today. I want to create trusting relationships with patients and provide genuine, compassionate, and quality care. I truly value your feedback! please be on the lookout for a survey regarding your visit with me today. I appreciate your input about our visit and your time in completing this!    Douglas [name] L. Tarina Volk, MSN, APRN, AGNP-C Adult-Gerontology Nurse Practitioner [facility] at [facility]

## 2024-08-17 NOTE — Progress Notes (Addendum)
 Referring Provider: Sheryle Carwin, MD Primary Care Physician:  Sheryle Carwin, MD Primary GI Physician: Dr. Eartha   Chief Complaint  Patient presents with   Hemorrhoids    Pt arrives for hemorrhoids. Pt states they are better now.    HPI:   Douglas [name] is a 89 y.o. male with past medical history of  aortic stenosis and thoracic aortic aneurysm s/p Bental surgery, atrial fibrillation, coronary artery disease status post CABG, fibromyalgia, hypertension, gout, PMR and history of Enterobacter bacteremia   Patient presenting today for:  Rectal pain Constipation Epigastric pain   Last seen September, at that time having phlegm in his throat, coughing. Some upper abdominal pain, occasional dysphagia. Reported more pressure/bleeding in perianal area, taking linzess  almost daily'  Recommended to take benzonatate  100mg  TID PRN, referral to wound care, linzess  145mcg daily, needs referral to pulm by PCP  Present:  States he was having some constipation previously and having some rectal pain, thinks he had some hemorrhoids, he soaked in warm tub and started eating yogurt and fiber one bars. He notes that constipation has improved, having 2-3 softer BMs per day now, softer in nature. He used some hemorrhoid cream but notes pain was more internally located so did not notice much improvement. He has no rectal pain currently. Denies any rectal bleeding. He notes he has continued on his linzess  145mcg daily, did not miss any doses, he has increased his water  intake.   He endorses some epigastric pain x1-2 weeks. Feels this is improving. Notes similar pain in the past. No nausea, vomiting or heartburn. Reports he had lost some weight previously when he was sick but appetite is improving and he has gained some weight back. He is taking protonix  40mg  daily. He tries to drink plenty of liquids when he eats to avoid dysphagia.  Pain seems to come on with certain foods but he is not sure what foods. Is not  taking anything for the pain.    Last Colonoscopy: 2008, normal  Last Endoscopy:   Filed Weights   08/17/24 1536  Weight: 180 lb 8 oz (81.9 kg)     Past Medical History:  Diagnosis Date   Acute upper GI bleed 05/18/2016   Aneurysm of thoracic aorta    Ascending. 5.7 x 5.5cm - Bentall procedure   Aortic stenosis    Bioprosthetic AVR   Arthritis    Atrial fibrillation (HCC)    Documented on pacer interrogation 6/13   Complete heart block (HCC)    Status post pacemaker placement   Coronary atherosclerosis of native coronary artery    Multivessel - LIMA to LAD, SVG to OM, SVG to PDA   DJD (degenerative joint disease)    Esophageal dysmotility    Essential hypertension    Fibromyalgia    Gout    H/O arteriovenous malformation (AVM)    H/O hiatal hernia    Hemorrhoid    History of esophageal ulcer    History of skin cancer    Hx of adenomatous colonic polyps    Iron  deficiency anemia due to chronic blood loss    Melena    Pacemaker-Medtronic    PMR (polymyalgia rheumatica)    SIRS (systemic inflammatory response syndrome) (HCC) 01/16/2015    Past Surgical History:  Procedure Laterality Date   AORTIC VALVE REPLACEMENT  01/24/12   APPENDECTOMY     BACK SURGERY     1965 or 1966 lumbar back surgery   BENTALL PROCEDURE  01/24/2012  Procedure: BENTALL PROCEDURE;  Surgeon: Maude Fleeta Ochoa, MD;  Location: [facility] OR;  Service: Open Heart Surgery;  Laterality: N/A;   BILATERAL KNEE ARTHROSCOPY     CATARACT EXTRACTION W/PHACO Left 12/03/2016   Procedure: CATARACT EXTRACTION PHACO AND INTRAOCULAR LENS PLACEMENT LEFT EYE;  Surgeon: Cherene Mania, MD;  Location: [facility] ORS;  Service: Ophthalmology;  Laterality: Left;  CDE:  10.87   CATARACT EXTRACTION W/PHACO Right 12/13/2016   Procedure: CATARACT EXTRACTION PHACO AND INTRAOCULAR LENS PLACEMENT (IOC);  Surgeon: Cherene Mania, MD;  Location: [facility] ORS;  Service: Ophthalmology;  Laterality: Right;  right cde 11.78   CORONARY ARTERY BYPASS GRAFT  01/24/2012    Procedure: CORONARY ARTERY BYPASS GRAFTING (CABG);  Surgeon: Maude Fleeta Ochoa, MD;  Location: [facility] OR;  Service: Open Heart Surgery;  Laterality: N/A;   ESOPHAGOGASTRODUODENOSCOPY  06/17/2012   Procedure: ESOPHAGOGASTRODUODENOSCOPY (EGD);  Surgeon: Claudis RAYMOND Rivet, MD;  Location: [facility] ENDO SUITE;  Service: Endoscopy;  Laterality: N/A;  ED   ESOPHAGOGASTRODUODENOSCOPY N/A 05/19/2016   Procedure: ESOPHAGOGASTRODUODENOSCOPY (EGD);  Surgeon: Margo LITTIE Haddock, MD;  Location: [facility] ENDO SUITE;  Service: Endoscopy;  Laterality: N/A;   HEMORROIDECTOMY     HERNIA REPAIR     Left inguinal hernia repair     PACEMAKER PLACEMENT     Medtronic 4/13 - Dr. Kelsie   PERMANENT PACEMAKER INSERTION N/A 02/01/2012   Procedure: PERMANENT PACEMAKER INSERTION;  Surgeon: Lynwood Kelsie, MD; Medtronic Adapta L model ADDRL 1 (serial number [id number])     TRANSESOPHAGEAL ECHOCARDIOGRAM (CATH LAB) N/A 12/23/2023   Procedure: TRANSESOPHAGEAL ECHOCARDIOGRAM;  Surgeon: Lonni Slain, MD;  Location: [facility] INVASIVE CV LAB;  Service: Cardiovascular;  Laterality: N/A;    Current Outpatient Medications  Medication Sig Dispense Refill   acetaminophen  (TYLENOL ) 325 MG tablet Take 2 tablets (650 mg total) by mouth every 6 (six) hours as needed for mild pain (pain score 1-3) or fever.     allopurinol  (ZYLOPRIM ) 300 MG tablet Take 1 tablet (300 mg total) by mouth daily. Take 1  daily 30 tablet 0   furosemide  (LASIX ) 20 MG tablet Take 1 tablet (20 mg total) by mouth every other day. 15 tablet 0   levothyroxine (SYNTHROID) 50 MCG tablet Take 50 mcg by mouth every morning.     LINZESS  145 MCG CAPS capsule Take 145 mcg by mouth daily.     metoprolol  succinate (TOPROL -XL) 25 MG 24 hr tablet Take 1 tablet (25 mg total) by mouth daily. 90 tablet 3   pantoprazole  (PROTONIX ) 40 MG tablet Take 1 tablet (40 mg total) by mouth daily. 30 tablet 0   pravastatin  (PRAVACHOL ) 20 MG tablet Take 1 tablet (20 mg total) by mouth every evening. 30 tablet 0    warfarin (COUMADIN ) 5 MG tablet Take 0.5 tablets (2.5 mg total) by mouth every evening. Take 1 tablet by mouth on Monday, Wednesday, and Friday, and then 0.5 tablet by mouth daily on all the other days 90 tablet 0   No current facility-administered medications for this visit.    Allergies as of 08/17/2024 - Review Complete 08/17/2024  Allergen Reaction Noted   Imdur  [isosorbide  nitrate] Other (See Comments) 12/28/2015    Social History   Socioeconomic History   Marital status: Married    Spouse name: Vernell   Number of children: 4   Years of education: Not on file   Highest education level: Not on file  Occupational History   Occupation: Retired    Comment: Engineer, maintenance  Tobacco Use  Smoking status: Never   Smokeless tobacco: Never  Vaping Use   Vaping status: Never Used  Substance and Sexual Activity   Alcohol use: No    Alcohol/week: 0.0 standard drinks of alcohol   Drug use: No   Sexual activity: Never    Birth control/protection: None  Other Topics Concern   Not on file  Social History Narrative   Lives with wife, has been married since 1954.   Social Drivers of Corporate Investment Banker Strain: Low Risk  (02/06/2024)   Overall Financial Resource Strain (CARDIA)    Difficulty of Paying Living Expenses: Not hard at all  Food Insecurity: No Food Insecurity (03/26/2024)   Hunger Vital Sign    Worried About Running Out of Food in the Last Year: Never true    Ran Out of Food in the Last Year: Never true  Transportation Needs: No Transportation Needs (02/06/2024)   PRAPARE - Administrator, Civil Service (Medical): No    Lack of Transportation (Non-Medical): No  Physical Activity: Inactive (02/06/2024)   Exercise Vital Sign    Days of Exercise per Week: 0 days    Minutes of Exercise per Session: 10 min  Stress: No Stress Concern Present (02/06/2024)   [employer] of Occupational Health - Occupational Stress Questionnaire    Feeling of Stress : Not  at all  Social Connections: Moderately Integrated (02/06/2024)   Social Connection and Isolation Panel    Frequency of Communication with Friends and Family: Twice a week    Frequency of Social Gatherings with Friends and Family: Twice a week    Attends Religious Services: 1 to 4 times per year    Active Member of [employer] or Organizations: No    Attends Banker Meetings: Never    Marital Status: Married  Recent Concern: Social Connections - Moderately Isolated (01/10/2024)   Social Connection and Isolation Panel    Frequency of Communication with Friends and Family: Never    Frequency of Social Gatherings with Friends and Family: Once a week    Attends Religious Services: 1 to 4 times per year    Active Member of [employer] or Organizations: No    Attends Engineer, Structural: Never    Marital Status: Married    Review of systems General: negative for malaise, night sweats, fever, chills, weight loss Neck: Negative for lumps, goiter, pain and significant neck swelling Resp: Negative for cough, wheezing, dyspnea at rest CV: Negative for chest pain, leg swelling, palpitations, orthopnea GI: denies melena, hematochezia, nausea, vomiting, diarrhea, dysphagia, odyonophagia, early satiety or unintentional weight loss. +epigastric pain +previous constipation/rectal pain, now resolved  MSK: Negative for joint pain or swelling, back pain, and muscle pain. Derm: Negative for itching or rash Psych: Denies depression, anxiety, memory loss, confusion. No homicidal or suicidal ideation.  Heme: Negative for prolonged bleeding, bruising easily, and swollen nodes. Endocrine: Negative for cold or heat intolerance, polyuria, polydipsia and goiter. Neuro: negative for tremor, gait imbalance, syncope and seizures. The remainder of the review of systems is noncontributory.  Physical Exam: BP 127/64   Pulse 80   Temp (!) 97.2 F (36.2 C)   Ht 5' 5 (1.651 m)   Wt 180 lb 8 oz (81.9 kg)    BMI 30.04 kg/m  General:   Alert and oriented. No distress noted. Pleasant and cooperative.  Head:  Normocephalic and atraumatic. Eyes:  Conjuctiva clear without scleral icterus. Mouth:  Oral mucosa pink and moist. Good  dentition. No lesions. Heart: Normal rate and rhythm, s1 and s2 heart sounds present.  Lungs: Clear lung sounds in all lobes. Respirations equal and unlabored. Abdomen:  +BS, soft, non-tender and non-distended. No rebound or guarding. No HSM or masses noted. Rectal: declined  Derm: No palmar erythema or jaundice Msk:  Symmetrical without gross deformities. Normal posture. Extremities:  Without edema. Neurologic:  Alert and  oriented x4 Psych:  Alert and cooperative. Normal mood and affect.  Invalid input(s): 6 MONTHS   ASSESSMENT: Douglas [name] is a 89 y.o. male presenting today for constipation, rectal pain and epigastric discomfort  Constipation/rectal pain: previously with some harder stools and rectal pain, no sensation of tissue protruding or rectal bleeding. He is taking linzess . He increased fiber and water  intake and had resolution of constipation and rectal pain. Recommend continuing with current regimen. If he has recurrence of symptoms, he should make me aware  Epigastric pain: history of GERD, taking protonix  40mg  daily with good control of GERD. Notes similar epigastric pain in the past, history of hiatal hernia. No nausea, vomiting, changes in appetite. Thinks certain foods may make his pain worse but cannot pinpoint what. Suspect this is a GERD flare/gastritis, recommend continuing PPI daily, will trial short course of carafate, he should make me aware if symptoms do not resolve.   PLAN:  -Continue linzess  daily -Continue good water  intake and fiber  -pt to make me aware of recurrence of rectal pain/if epigastric pain does not resolve.  -Continue protonix  40mg  daily -carafate 1g QID  All questions were answered, patient verbalized understanding and is  in agreement with plan as outlined above.   Follow Up: Has recall for February   Ruford Dudzinski L. Mariette, MSN, APRN, AGNP-C Adult-Gerontology Nurse Practitioner [facility]   I have reviewed the note and agree with the APP's assessment as described in this progress note  Toribio Fortune, MD Gastroenterology and Hepatology [facility]

## 2024-08-18 ENCOUNTER — Other Ambulatory Visit: Payer: Self-pay | Admitting: Cardiology

## 2024-08-18 ENCOUNTER — Ambulatory Visit: Payer: Medicare Other

## 2024-08-18 MED ORDER — WARFARIN SODIUM 5 MG PO TABS: ORAL_TABLET | ORAL | 1 refills | Status: DC

## 2024-08-18 NOTE — Telephone Encounter (Signed)
 Refill request for warfarin:  Last INR was 2.8 on 07/22/24 Next INR due 09/02/24 LOV was 07/23/24  Refill approved.

## 2024-08-26 ENCOUNTER — Ambulatory Visit (INDEPENDENT_AMBULATORY_CARE_PROVIDER_SITE_OTHER)

## 2024-08-26 DIAGNOSIS — I480 Paroxysmal atrial fibrillation: Secondary | ICD-10-CM

## 2024-08-27 LAB — CUP PACEART REMOTE DEVICE CHECK
Battery Impedance: 4249 Ohm
Battery Remaining Longevity: 18 mo
Battery Voltage: 2.67 V
Brady Statistic RV Percent Paced: 100 %
Date Time Interrogation Session: 20251105092450
Implantable Lead Connection Status: 753985
Implantable Lead Connection Status: 753985
Implantable Lead Implant Date: 20130412
Implantable Lead Implant Date: 20130412
Implantable Lead Location: 753859
Implantable Lead Location: 753860
Implantable Lead Model: 5076
Implantable Lead Model: 5076
Implantable Pulse Generator Implant Date: 20130412
Lead Channel Impedance Value: 494 Ohm
Lead Channel Impedance Value: 67 Ohm
Lead Channel Pacing Threshold Amplitude: 0.75 V
Lead Channel Pacing Threshold Pulse Width: 0.4 ms
Lead Channel Setting Pacing Amplitude: 2 V
Lead Channel Setting Pacing Pulse Width: 0.4 ms
Lead Channel Setting Sensing Sensitivity: 4 mV
Zone Setting Status: 755011
Zone Setting Status: 755011

## 2024-08-27 NOTE — Progress Notes (Signed)
 Remote PPM Transmission

## 2024-08-28 ENCOUNTER — Telehealth: Payer: Self-pay

## 2024-08-28 NOTE — Patient Instructions (Signed)
 [name] - I am sorry I was unable to reach you today for our scheduled appointment. I work with Sheryle Carwin, MD and am calling to support your healthcare needs. Please contact me at [phone] at your earliest convenience. I look forward to speaking with you soon.   Thank you,  Hendricks Her RN, BSN  [place] [facility]-Population Health RN Case Manager   Direct [phone]

## 2024-08-30 ENCOUNTER — Ambulatory Visit: Payer: Self-pay | Admitting: Student in an Organized Health Care Education/Training Program

## 2024-09-02 ENCOUNTER — Telehealth: Payer: Self-pay

## 2024-09-02 ENCOUNTER — Ambulatory Visit: Attending: Cardiology | Admitting: *Deleted

## 2024-09-02 DIAGNOSIS — Z5181 Encounter for therapeutic drug level monitoring: Secondary | ICD-10-CM | POA: Diagnosis not present

## 2024-09-02 DIAGNOSIS — I480 Paroxysmal atrial fibrillation: Secondary | ICD-10-CM | POA: Insufficient documentation

## 2024-09-02 DIAGNOSIS — Z952 Presence of prosthetic heart valve: Secondary | ICD-10-CM | POA: Diagnosis not present

## 2024-09-02 LAB — POCT INR: INR: 4.5 — AB (ref 2.0–3.0)

## 2024-09-02 NOTE — Patient Instructions (Signed)
 Hold warfarin tonight, take 1/2 tablet tomorrow night then resume 1 tablet daily  Recheck in 3 wks Continue greens.

## 2024-09-02 NOTE — Progress Notes (Signed)
 INR 4.5 Please see anticoagulation encounter

## 2024-09-09 DIAGNOSIS — L814 Other melanin hyperpigmentation: Secondary | ICD-10-CM | POA: Diagnosis not present

## 2024-09-09 DIAGNOSIS — Z85828 Personal history of other malignant neoplasm of skin: Secondary | ICD-10-CM | POA: Diagnosis not present

## 2024-09-09 DIAGNOSIS — C44319 Basal cell carcinoma of skin of other parts of face: Secondary | ICD-10-CM | POA: Diagnosis not present

## 2024-09-09 DIAGNOSIS — Z23 Encounter for immunization: Secondary | ICD-10-CM | POA: Diagnosis not present

## 2024-09-15 ENCOUNTER — Encounter (HOSPITAL_COMMUNITY): Payer: Self-pay | Admitting: Emergency Medicine

## 2024-09-15 ENCOUNTER — Emergency Department (HOSPITAL_COMMUNITY)

## 2024-09-15 ENCOUNTER — Other Ambulatory Visit: Payer: Self-pay

## 2024-09-15 ENCOUNTER — Emergency Department (HOSPITAL_COMMUNITY)
Admission: EM | Admit: 2024-09-15 | Discharge: 2024-09-16 | Disposition: A | Discharge status: Home / Self Care | Attending: Emergency Medicine | Admitting: Emergency Medicine

## 2024-09-15 DIAGNOSIS — I10 Essential (primary) hypertension: Secondary | ICD-10-CM | POA: Diagnosis not present

## 2024-09-15 DIAGNOSIS — Z950 Presence of cardiac pacemaker: Secondary | ICD-10-CM | POA: Insufficient documentation

## 2024-09-15 DIAGNOSIS — I700 Atherosclerosis of aorta: Secondary | ICD-10-CM | POA: Diagnosis not present

## 2024-09-15 DIAGNOSIS — R053 Chronic cough: Secondary | ICD-10-CM | POA: Insufficient documentation

## 2024-09-15 DIAGNOSIS — D649 Anemia, unspecified: Secondary | ICD-10-CM | POA: Insufficient documentation

## 2024-09-15 DIAGNOSIS — D6869 Other thrombophilia: Secondary | ICD-10-CM | POA: Diagnosis not present

## 2024-09-15 DIAGNOSIS — R051 Acute cough: Secondary | ICD-10-CM | POA: Diagnosis not present

## 2024-09-15 DIAGNOSIS — R7989 Other specified abnormal findings of blood chemistry: Secondary | ICD-10-CM | POA: Diagnosis not present

## 2024-09-15 DIAGNOSIS — I25811 Atherosclerosis of native coronary artery of transplanted heart without angina pectoris: Secondary | ICD-10-CM | POA: Insufficient documentation

## 2024-09-15 DIAGNOSIS — R042 Hemoptysis: Secondary | ICD-10-CM | POA: Diagnosis not present

## 2024-09-15 DIAGNOSIS — Z7901 Long term (current) use of anticoagulants: Secondary | ICD-10-CM | POA: Diagnosis not present

## 2024-09-15 DIAGNOSIS — R0989 Other specified symptoms and signs involving the circulatory and respiratory systems: Secondary | ICD-10-CM | POA: Diagnosis not present

## 2024-09-15 DIAGNOSIS — D509 Iron deficiency anemia, unspecified: Secondary | ICD-10-CM | POA: Diagnosis not present

## 2024-09-15 DIAGNOSIS — Z79899 Other long term (current) drug therapy: Secondary | ICD-10-CM | POA: Insufficient documentation

## 2024-09-15 DIAGNOSIS — R011 Cardiac murmur, unspecified: Secondary | ICD-10-CM | POA: Diagnosis not present

## 2024-09-15 DIAGNOSIS — J90 Pleural effusion, not elsewhere classified: Secondary | ICD-10-CM | POA: Diagnosis not present

## 2024-09-15 DIAGNOSIS — I5032 Chronic diastolic (congestive) heart failure: Secondary | ICD-10-CM | POA: Diagnosis not present

## 2024-09-15 DIAGNOSIS — D500 Iron deficiency anemia secondary to blood loss (chronic): Secondary | ICD-10-CM | POA: Diagnosis not present

## 2024-09-15 DIAGNOSIS — R059 Cough, unspecified: Secondary | ICD-10-CM | POA: Diagnosis present

## 2024-09-15 DIAGNOSIS — J810 Acute pulmonary edema: Secondary | ICD-10-CM | POA: Diagnosis not present

## 2024-09-15 DIAGNOSIS — Z85828 Personal history of other malignant neoplasm of skin: Secondary | ICD-10-CM | POA: Diagnosis not present

## 2024-09-15 DIAGNOSIS — R918 Other nonspecific abnormal finding of lung field: Secondary | ICD-10-CM | POA: Diagnosis not present

## 2024-09-15 LAB — CBC WITH DIFFERENTIAL/PLATELET
Abs Immature Granulocytes: 0.05 K/uL (ref 0.00–0.07)
Basophils Absolute: 0.1 K/uL (ref 0.0–0.1)
Basophils Relative: 1 %
Eosinophils Absolute: 0.2 K/uL (ref 0.0–0.5)
Eosinophils Relative: 1 %
HCT: 26.3 % — ABNORMAL LOW (ref 39.0–52.0)
Hemoglobin: 7.6 g/dL — ABNORMAL LOW (ref 13.0–17.0)
Immature Granulocytes: 0 %
Lymphocytes Relative: 7 %
Lymphs Abs: 0.8 K/uL (ref 0.7–4.0)
MCH: 24.4 pg — ABNORMAL LOW (ref 26.0–34.0)
MCHC: 28.9 g/dL — ABNORMAL LOW (ref 30.0–36.0)
MCV: 84.3 fL (ref 80.0–100.0)
Monocytes Absolute: 1 K/uL (ref 0.1–1.0)
Monocytes Relative: 9 %
Neutro Abs: 9.4 K/uL — ABNORMAL HIGH (ref 1.7–7.7)
Neutrophils Relative %: 82 %
Platelets: 223 K/uL (ref 150–400)
RBC: 3.12 MIL/uL — ABNORMAL LOW (ref 4.22–5.81)
RDW: 17.3 % — ABNORMAL HIGH (ref 11.5–15.5)
WBC: 11.4 K/uL — ABNORMAL HIGH (ref 4.0–10.5)
nRBC: 0 % (ref 0.0–0.2)

## 2024-09-15 MED ORDER — BENZONATATE 100 MG PO CAPS
100.0000 mg | ORAL_CAPSULE | Freq: Once | ORAL | Status: AC
  Administered 2024-09-15: 100 mg via ORAL
  Filled 2024-09-15: qty 1

## 2024-09-15 NOTE — ED Triage Notes (Signed)
 Pt presents with cough and chills starting today, pt denies all other symptoms, states he took a Covid shot x 1 week ago

## 2024-09-16 DIAGNOSIS — J810 Acute pulmonary edema: Secondary | ICD-10-CM | POA: Diagnosis not present

## 2024-09-16 LAB — COMPREHENSIVE METABOLIC PANEL WITH GFR
ALT: 11 U/L (ref 0–44)
AST: 20 U/L (ref 15–41)
Albumin: 3.9 g/dL (ref 3.5–5.0)
Alkaline Phosphatase: 139 U/L — ABNORMAL HIGH (ref 38–126)
Anion gap: 11 (ref 5–15)
BUN: 29 mg/dL — ABNORMAL HIGH (ref 8–23)
CO2: 18 mmol/L — ABNORMAL LOW (ref 22–32)
Calcium: 8.6 mg/dL — ABNORMAL LOW (ref 8.9–10.3)
Chloride: 109 mmol/L (ref 98–111)
Creatinine, Ser: 1.1 mg/dL (ref 0.61–1.24)
GFR, Estimated: >60 mL/min (ref >60)
Glucose, Bld: 145 mg/dL — ABNORMAL HIGH (ref 70–99)
Potassium: 4.1 mmol/L (ref 3.5–5.1)
Sodium: 138 mmol/L (ref 135–145)
Total Bilirubin: 0.5 mg/dL (ref 0.0–1.2)
Total Protein: 7.2 g/dL (ref 6.5–8.1)

## 2024-09-16 LAB — RESP PANEL BY RT-PCR (RSV, FLU A&B, COVID)  RVPGX2
Influenza A by PCR: NEGATIVE
Influenza B by PCR: NEGATIVE
Resp Syncytial Virus by PCR: NEGATIVE
SARS Coronavirus 2 by RT PCR: NEGATIVE

## 2024-09-16 LAB — VITAMIN B12: Vitamin B-12: 624 pg/mL (ref 180–914)

## 2024-09-16 LAB — IRON AND TIBC
Iron: 17 ug/dL — ABNORMAL LOW (ref 45–182)
Saturation Ratios: 4 % — ABNORMAL LOW (ref 17.9–39.5)
TIBC: 419 ug/dL (ref 250–450)
UIBC: 402 ug/dL

## 2024-09-16 LAB — RETICULOCYTES
Immature Retic Fract: 13.4 % (ref 2.3–15.9)
RBC.: 4.62 MIL/uL (ref 4.22–5.81)
Retic Count, Absolute: 88.2 K/uL (ref 19.0–186.0)
Retic Ct Pct: 1.9 % (ref 0.4–3.1)

## 2024-09-16 LAB — PROTIME-INR
INR: 3.1 — ABNORMAL HIGH (ref 0.8–1.2)
Prothrombin Time: 33 s — ABNORMAL HIGH (ref 11.4–15.2)

## 2024-09-16 LAB — FOLATE: Folate: 11.5 ng/mL (ref >5.9)

## 2024-09-16 LAB — FERRITIN: Ferritin: 12 ng/mL — ABNORMAL LOW (ref 24–336)

## 2024-09-16 LAB — PRO BRAIN NATRIURETIC PEPTIDE: Pro Brain Natriuretic Peptide: 4119 pg/mL — ABNORMAL HIGH

## 2024-09-16 MED ORDER — BENZONATATE 100 MG PO CAPS: 100.0000 mg | ORAL_CAPSULE | Freq: Three times a day (TID) | ORAL | 0 refills | Status: DC | PRN

## 2024-09-16 MED ORDER — FUROSEMIDE 10 MG/ML IJ SOLN
20.0000 mg | Freq: Once | INTRAMUSCULAR | Status: AC
  Administered 2024-09-16: 20 mg via INTRAVENOUS
  Filled 2024-09-16: qty 2

## 2024-09-16 NOTE — ED Provider Notes (Signed)
 Emergency Department Provider Note  TRIAGE NOTE: Pt presents with cough and chills starting today, pt denies all other symptoms, states he took a Covid shot x 1 week ago  HISTORY  Chief Complaint Cough   HPI Douglas [name] is a 89 y.o. male with  a chief complaint of persistent dry cough that began in the middle of the afternoon around 3:00 PM. The cough was non-productive and was accompanied by a tickling sensation in the throat. The patient reports that the cough persisted throughout the evening and improved after taking Tylenol  around 9:00 PM. He denies any fever, recent illness, or exposure to sick contacts. The patient received a COVID-19 vaccination approximately a week ago but has not felt unwell since. He has a history of a pacemaker placement. The patient attempted to alleviate the cough with cough drops earlier in the day, which provided minimal relief. History was obtained from the patient.  PMH Past Medical History:  Diagnosis Date   Acute upper GI bleed 05/18/2016   Aneurysm of thoracic aorta    Ascending. 5.7 x 5.5cm - Bentall procedure   Aortic stenosis    Bioprosthetic AVR   Arthritis    Atrial fibrillation (HCC)    Documented on pacer interrogation 6/13   Complete heart block (HCC)    Status post pacemaker placement   Coronary atherosclerosis of native coronary artery    Multivessel - LIMA to LAD, SVG to OM, SVG to PDA   DJD (degenerative joint disease)    Esophageal dysmotility    Essential hypertension    Fibromyalgia    Gout    H/O arteriovenous malformation (AVM)    H/O hiatal hernia    Hemorrhoid    History of esophageal ulcer    History of skin cancer    Hx of adenomatous colonic polyps    Iron  deficiency anemia due to chronic blood loss    Melena    Pacemaker-Medtronic    PMR (polymyalgia rheumatica)    SIRS (systemic inflammatory response syndrome) (HCC) 01/16/2015    Home Medications Prior to Admission medications   Medication Sig Start Date  End Date Taking? Authorizing Provider  benzonatate  (TESSALON ) 100 MG capsule Take 1 capsule (100 mg total) by mouth 3 (three) times daily as needed for cough. 09/16/24  Yes Tehilla Coffel, Selinda, MD  acetaminophen  (TYLENOL ) 325 MG tablet Take 2 tablets (650 mg total) by mouth every 6 (six) hours as needed for mild pain (pain score 1-3) or fever. 01/16/24   Pearlean Manus, MD  allopurinol  (ZYLOPRIM ) 300 MG tablet Take 1 tablet (300 mg total) by mouth daily. Take 1  daily 01/27/24   Landy Barnie RAMAN, NP  furosemide  (LASIX ) 20 MG tablet Take 1 tablet (20 mg total) by mouth every other day. 01/27/24   Landy Barnie RAMAN, NP  levothyroxine (SYNTHROID) 50 MCG tablet Take 50 mcg by mouth every morning. 04/02/24   [provider]  LINZESS  145 MCG CAPS capsule Take 145 mcg by mouth daily. 01/17/24   [provider]  metoprolol  succinate (TOPROL -XL) 25 MG 24 hr tablet Take 1 tablet (25 mg total) by mouth daily. 06/26/24   Debera Jayson MATSU, MD  pantoprazole  (PROTONIX ) 40 MG tablet Take 1 tablet (40 mg total) by mouth daily. 01/27/24   Landy Barnie RAMAN, NP  pravastatin  (PRAVACHOL ) 20 MG tablet Take 1 tablet (20 mg total) by mouth every evening. 01/27/24   Landy Barnie RAMAN, NP  sucralfate  (CARAFATE ) 1 GM/10ML suspension Take 10 mLs (1 g  total) by mouth 4 (four) times daily. 08/17/24   Mariette Mitzie CROME, NP  warfarin (COUMADIN ) 5 MG tablet Take 1 tablet daily or as directed by anticoagulation clinic 08/18/24   Debera Jayson MATSU, MD    Social History Social History   Tobacco Use   Smoking status: Never   Smokeless tobacco: Never  Vaping Use   Vaping status: Never Used  Substance Use Topics   Alcohol use: No    Alcohol/week: 0.0 standard drinks of alcohol   Drug use: No    Review of Systems: Documented in HPI ____________________________________________  PHYSICAL EXAM: VITAL SIGNS: Triage: Blood pressure (!) 141/73, pulse 84, temperature 98.3 F (36.8 C), temperature source Oral, resp. rate 19,  height 5' 5 (1.651 m), weight 83.9 kg, SpO2 100%.  Vitals:   09/16/24 0100 09/16/24 0347 09/16/24 0430 09/16/24 0620  BP: 107/65 125/72 134/78 124/68  Pulse: 70 77 70 70  Resp:  18  17  Temp:  97.7 F (36.5 C)  98 F (36.7 C)  TempSrc:  Oral  Oral  SpO2: 98% 97% 98% 98%  Weight:      Height:        Physical Exam Vitals and nursing note reviewed.  Constitutional:      Appearance: He is well-developed.  HENT:     Head: Normocephalic and atraumatic.  Cardiovascular:     Rate and Rhythm: Normal rate.     Heart sounds: Murmur heard.  Pulmonary:     Effort: Pulmonary effort is normal. No respiratory distress.     Comments: Coughing No wheezing Mild crackles in bases that clear with coughing Abdominal:     General: There is no distension.  Musculoskeletal:        General: Normal range of motion.     Cervical back: Normal range of motion.  Neurological:     Mental Status: He is alert.       ____________________________________________   LABS (all labs ordered are listed, but only abnormal results are displayed)  Labs Reviewed  CBC WITH DIFFERENTIAL/PLATELET - Abnormal; Notable for the following components:      Result Value   WBC 11.4 (*)    RBC 3.12 (*)    Hemoglobin 7.6 (*)    HCT 26.3 (*)    MCH 24.4 (*)    MCHC 28.9 (*)    RDW 17.3 (*)    Neutro Abs 9.4 (*)    All other components within normal limits  COMPREHENSIVE METABOLIC PANEL WITH GFR - Abnormal; Notable for the following components:   CO2 18 (*)    Glucose, Bld 145 (*)    BUN 29 (*)    Calcium  8.6 (*)    Alkaline Phosphatase 139 (*)    All other components within normal limits  PRO BRAIN NATRIURETIC PEPTIDE - Abnormal; Notable for the following components:   Pro Brain Natriuretic Peptide 4,119.0 (*)    All other components within normal limits  PROTIME-INR - Abnormal; Notable for the following components:   Prothrombin Time 33.0 (*)    INR 3.1 (*)    All other components within normal limits   IRON  AND TIBC - Abnormal; Notable for the following components:   Iron  17 (*)    Saturation Ratios 4 (*)    All other components within normal limits  FERRITIN - Abnormal; Notable for the following components:   Ferritin 12 (*)    All other components within normal limits  RESP PANEL BY RT-PCR (RSV, FLU A&B,  COVID)  RVPGX2  CULTURE, BLOOD (ROUTINE X 2)  CULTURE, BLOOD (ROUTINE X 2)  VITAMIN B12  FOLATE  RETICULOCYTES   ____________________________________________  EKG   EKG Interpretation Date/Time:    Ventricular Rate:    PR Interval:    QRS Duration:    QT Interval:    QTC Calculation:   R Axis:      Text Interpretation:          ____________________________________________  RADIOLOGY  DG Chest 2 View Result Date: 09/15/2024 EXAM: 2 VIEW(S) XRAY OF THE CHEST 09/15/2024 10:05:00 PM COMPARISON: 07/23/2024 CLINICAL HISTORY: cough, congestion FINDINGS: LUNGS AND PLEURA: New right infrahilar airspace infiltrate. Left base subsegmental atelectasis or scarring. Stable small left pleural effusion. No pneumothorax. HEART AND MEDIASTINUM: Stable cardiomegaly. CABG markers noted. Left dual lead pacemaker. Aortic atherosclerosis (ICD10-170.0). BONES AND SOFT TISSUES: Sternotomy wires noted. Thoracic degenerative changes. IMPRESSION: 1. New right infrahilar airspace opacity, suspicious for pneumonia or aspiration. 2. Left base subsegmental atelectasis or scarring, likely chronic and unchanged. 3. Stable small left pleural effusion. Electronically signed by: Oneil Devonshire MD 09/15/2024 10:18 PM EST RP Workstation: [id number]   ____________________________________________  PROCEDURES  Procedure(s) performed:   .Critical Care  Performed by: Lorette Mayo, MD Authorized by: Lorette Mayo, MD   Critical care provider statement:    Critical care time (minutes):  30   Critical care was necessary to treat or prevent imminent or life-threatening deterioration of the following  conditions:  Respiratory failure and circulatory failure   Critical care was time spent personally by me on the following activities:  Development of treatment plan with patient or surrogate, discussions with consultants, evaluation of patient's response to treatment, examination of patient, ordering and review of laboratory studies, ordering and review of radiographic studies, ordering and performing treatments and interventions, pulse oximetry, re-evaluation of patient's condition and review of old charts  ____________________________________________  INITIAL IMPRESSION / ASSESSMENT AND PLAN   Clinical Course as of 09/16/24 0642  Wed Sep 16, 2024  0026 Initial Evaluation:  Patient has persistent dry cough since the afternoon without fever or productive sputum.  The differential diagnosis includes aspiration pneumonia, community-acquired pneumonia, congestive heart failure with pulmonary edema, bronchitis, and less likely, a pulmonary embolism.   Plan:  Order blood tests including a complete blood count and cardiac tests. Monitor test results to differentiate between potential causes such as pneumonia or heart failure. Consider empirical treatment for pneumonia if tests return normal due to patient's age and X-ray findings. Re-evaluate response and consider discharge with follow-up if appropriate. [JM]  0044 Resp panel by RT-PCR (RSV, Flu A&B, Covid) Anterior Nasal Swab [JM]  0044 Pro Brain Natriuretic Peptide(!): 4,119.0 With history of mitral valve issues, dry cough and exam suspect this is the cause. Will start diuresis.  [JM]  0044 Hemoglobin(!): 7.6 Trending down when compared to last couple years. Will address last colonoscopy/other sources of recent bleeding.  [JM]  0045 CO2(!): 18 Similar to baseline [JM]  0045 BUN(!): 29 Similar to baseline [JM]  0045 DG Chest 2 View Viewed interpreted by myself with right perihilar opacity, left pleural effusion and diffuse interstitial  edema. [JM]  0045 Overall suspect this is most likely pulmonary edema fluid overload causing his cough. [JM]  0046 His last echo was in July 2 which Dr. Debera noted Follow-up echocardiogram shows overall normal LVEF, now for 55 to 60% range.  Right ventricle is moderately dilated with moderately elevated RV pressure.  Moderate mitral regurgitation and stable prosthetic AVR.  [  JM]  0154 Ferritin(!): 12 [JM]  0154 Iron (!): 17 Likely iron  deficiency anemia and exacerbated by mechanical valve with expected hemolysis [JM]  0154 INR(!): 3.1 Will reassess after some diuresis for disposition [JM]  0309 Reportedly only made about 250 cc urine in last couple hours. Will allow a bit more diuresis and ambulate on pulse ox for disposition.  [JM]    Clinical Course User Index [JM] Candler Ginsberg, Selinda, MD     Images ordered viewed and obtained by myself. Agree with Radiology interpretation. Details in ED course.  Labs ordered reviewed by myself as detailed in ED course.  Consultations obtained/considered detailed in ED course.    CRITICAL INTERVENTIONS:  N/A  Patient ultimately able to ambulate without hypoxia, severe cough or current severe dyspnea.  He did diurese here in the ER.  Will have him take his Lasix  daily for the next week and follow-up with his primary doctor (normally only takes it every other day).  Return here for any new or worsening symptoms.  Patient stable for discharge at this time.   FINAL IMPRESSION Final diagnoses:  Acute cough  Acute pulmonary edema (HCC)  Iron  deficiency anemia, unspecified iron  deficiency anemia type     Disposition A medical screening exam was performed and I feel the patient has had an appropriate workup for their chief complaint at this time and likelihood of emergent condition existing is low. They have been counseled on decision, DISCHARGE, follow up and which symptoms necessitate immediate return to the emergency department. They or their family  verbally stated understanding and agreement with plan and discharged in stable condition.   ____________________________________________   NEW OUTPATIENT MEDICATIONS STARTED DURING THIS VISIT:  Current Discharge Medication List     START taking these medications   Details  benzonatate  (TESSALON ) 100 MG capsule Take 1 capsule (100 mg total) by mouth 3 (three) times daily as needed for cough. Qty: 21 capsule, Refills: 0        Note:  This note was prepared with assistance of Dragon voice recognition software. Occasional wrong-word or sound-a-like substitutions may have occurred due to the inherent limitations of voice recognition software.    Lorette Selinda, MD 09/16/24 [phone]

## 2024-09-16 NOTE — ED Notes (Signed)
 Family updated as to patient's discharge. Son, Chyrl, states he will be here in 15 minutes to take pt home.

## 2024-09-16 NOTE — ED Notes (Signed)
 Ambulated pt in hall. Pt's oxygen saturation decreased from 97% to 94% on room air.

## 2024-09-16 NOTE — Discharge Instructions (Signed)
 Please take your lasix  daily for the next 7 days and follow up with your primary doctor for recheck and to evaluate causes of your slightly worsening blood levels.

## 2024-09-20 LAB — CULTURE, BLOOD (ROUTINE X 2)
Culture: NO GROWTH
Culture: NO GROWTH
Special Requests: ADEQUATE
Special Requests: ADEQUATE

## 2024-09-23 ENCOUNTER — Ambulatory Visit: Payer: Self-pay

## 2024-09-23 ENCOUNTER — Ambulatory Visit: Attending: Cardiology | Admitting: *Deleted

## 2024-09-23 DIAGNOSIS — Z5181 Encounter for therapeutic drug level monitoring: Secondary | ICD-10-CM | POA: Insufficient documentation

## 2024-09-23 DIAGNOSIS — Z952 Presence of prosthetic heart valve: Secondary | ICD-10-CM | POA: Diagnosis not present

## 2024-09-23 DIAGNOSIS — I480 Paroxysmal atrial fibrillation: Secondary | ICD-10-CM | POA: Diagnosis not present

## 2024-09-23 LAB — POCT INR: INR: 3.8 — AB (ref 2.0–3.0)

## 2024-09-23 NOTE — Progress Notes (Signed)
 INR 3.8 Please see anticoagulation encounter

## 2024-09-23 NOTE — Telephone Encounter (Signed)
 FYI Only or Action Required?: FYI only for provider: appointment scheduled on 09/24/2024.  Patient is followed in Pulmonology for CHF, Hemoptysis, last seen on 07/23/2024 by Catherine Cools, MD.  Called Nurse Triage reporting Hemoptysis.  Symptoms began several months ago.  Interventions attempted: Nothing.  Symptoms are: gradually worsening.  Triage Disposition: See Physician Within 24 Hours  Patient/caregiver understands and will follow disposition?: Yes  FYI Only or Action Required?: FYI only for provider: appointment scheduled on 09/24/24. Pulm  Patient was last seen in primary care on 06/16/24.  Called Nurse Triage reporting Hemoptysis.  Symptoms began several months ago.  Interventions attempted: Nothing.  Symptoms are: gradually worsening.  Triage Disposition: See Physician Within 24 Hours  Patient/caregiver understands and will follow disposition?: Yes  Reason for Disposition  [1] Has underlying lung disease (e.g., COPD, chronic bronchitis or emphysema) AND [2] sputum has turned yellow or green in color  Answer Assessment - Initial Assessment Questions 1. ONSET: When did the cough begin?      Pt states he has been coughing up blood for some time usually about 2 times a week, but coughed up bright red blood on Monday morning-states it seemed more red and like an increased amount from previously   2. SEVERITY: How bad is the cough today? Did the blood appear after a coughing spell?      Pt states cough is not present today  3. SPUTUM: Describe the color of your sputum (e.g., none, dry cough; clear, white, yellow, green)     Usually yellow/brown, or red  4. HEMOPTYSIS: How much blood? (e.g., flecks, streaks, tablespoons)     Patient reports he coughed up more than usual on Monday  5. DIFFICULTY BREATHING: Are you having difficulty breathing? If Yes, ask: How bad is it? (e.g., mild, moderate, severe)      Mild difficulty breathing-pt states this is  baseline  6. FEVER: Do you have a fever? If Yes, ask: What is your temperature, how was it measured, and when did it start?     No  7. CARDIAC HISTORY: Do you have any history of heart disease? (e.g., heart attack, congestive heart failure)      Yes  8. LUNG HISTORY: Do you have any history of lung disease?  (e.g., pulmonary embolus, asthma, emphysema)     Yes  9. PE RISK FACTORS: Do you have a history of blood clots? Note: Other risk factors include recent major surgery, recent prolonged travel, being bedridden.     No  10. OTHER SYMPTOMS: Do you have any other symptoms? (e.g., runny nose, wheezing, chest pain)      Increased fatigue; Denies chest pain, lightheadedness, dizziness  11. PREGNANCY: Is there any chance you are pregnant? When was your last menstrual period?       NA  12. TRAVEL: Have you traveled out of the country in the last month? (e.g., travel history, exposures)       No  Protocols used: Coughing Up Blood-A-AH  Copied from CRM [id number]. Topic: Clinical - Red Word Triage >> Sep 23, 2024  3:34 PM Russell PARAS wrote: Red Word that prompted transfer to Nurse Triage:   Spitting up blood 1-2/week for 2 weeks Seen in ER last week for acute cough Performed xray and showed fluid has doubled since last visit.   Pt of Alghanim

## 2024-09-23 NOTE — Patient Instructions (Signed)
 Hold warfarin tonight then decrease dose to 1 tablet daily except 1/2 tablet on Sundays and Wednesdays Recheck in 3 wks Continue greens.

## 2024-09-24 ENCOUNTER — Encounter: Payer: Self-pay | Admitting: Internal Medicine

## 2024-09-24 ENCOUNTER — Ambulatory Visit: Admitting: Internal Medicine

## 2024-09-24 VITALS — BP 120/64 | HR 82 | Ht 65.0 in | Wt 187.0 lb

## 2024-09-24 DIAGNOSIS — R0609 Other forms of dyspnea: Secondary | ICD-10-CM | POA: Diagnosis not present

## 2024-09-24 DIAGNOSIS — R042 Hemoptysis: Secondary | ICD-10-CM | POA: Insufficient documentation

## 2024-09-24 NOTE — Patient Instructions (Addendum)
 My office will be contacting you by phone for referral to [facility] for high def CT chest   - if you don't hear back from my office within one week please call us  back or notify us  thru MyChart and we'll address it right away.   Please remember to go to the lab department   for your tests - we will call you with the results when they are available.  Please schedule a follow up office visit in 6 weeks, call sooner if needed > DR Alghanim

## 2024-09-24 NOTE — Progress Notes (Unsigned)
 Douglas [name], male    DOB: 1928-05-18    MRN: [id number]   Brief patient profile:  46   yowm never smoker  ACUTE PT  Alhghnim pt  self-referred   to  pulmonary clinic in [place]  09/24/2024  for hemoptysis last 09/21/24 on coumadin   sp AVR April 2013 recurrent since summer 2024  Echo 05/11/24  s/p AVR working ok but  has mod MR   Seen in ER 09/16/24 with dry cough / R LL ? Pna but BNP 4119 and low hgb/ ferriton   rx with diuresis and tessalon   History of Present Illness  09/24/2024  Pulmonary/ 1st office eval/ Lorianne Malbrough / [place] [facility]  Chief Complaint  Patient presents with   Acute Visit    Hemoptysis  Bright red once usually darker other times yellow brown mucus Anb ct   Dyspnea:  tired more than sob/ walks with walker at home/ uses cane away from home Sleep:  30 degrees recliner  x years - no greater incline than usual SABA use: none  02 ldz:[id number]     No obvious day to day or daytime pattern/variability or assoc excess/ purulent sputum or mucus plugs or cp or chest tightness, subjective wheeze or overt sinus or hb symptoms.    Also denies any obvious fluctuation of symptoms with weather or environmental changes or other aggravating or alleviating factors except as outlined above   No unusual exposure hx or h/o childhood pna/ asthma or knowledge of premature birth.  Current Allergies, Complete Past Medical History, Past Surgical History, Family History, and Social History were reviewed in [employer] record.  ROS  The following are not active complaints unless bolded Hoarseness, sore throat, dysphagia, dental problems, itching, sneezing,  nasal congestion or discharge of excess mucus or purulent secretions, ear ache,   fever, chills, sweats, unintended wt loss or wt gain, classically pleuritic or exertional cp,  orthopnea pnd or arm/hand swelling  or leg swelling, presyncope, palpitations, abdominal pain, anorexia, nausea, vomiting, diarrhea  or change in  bowel habits or change in bladder habits, change in stools or change in urine, dysuria, hematuria,  rash, arthralgias, visual complaints, headache, numbness, weakness or ataxia or problems with walking or coordination,  change in mood or  memory.            Outpatient Medications Prior to Visit  Medication Sig Dispense Refill   acetaminophen  (TYLENOL ) 325 MG tablet Take 2 tablets (650 mg total) by mouth every 6 (six) hours as needed for mild pain (pain score 1-3) or fever.     allopurinol  (ZYLOPRIM ) 300 MG tablet Take 1 tablet (300 mg total) by mouth daily. Take 1  daily 30 tablet 0   benzonatate  (TESSALON ) 100 MG capsule Take 1 capsule (100 mg total) by mouth 3 (three) times daily as needed for cough. 21 capsule 0   furosemide  (LASIX ) 20 MG tablet Take 1 tablet (20 mg total) by mouth every other day. 15 tablet 0   levothyroxine (SYNTHROID) 50 MCG tablet Take 50 mcg by mouth every morning.     LINZESS  145 MCG CAPS capsule Take 145 mcg by mouth daily.     metoprolol  succinate (TOPROL -XL) 25 MG 24 hr tablet Take 1 tablet (25 mg total) by mouth daily. 90 tablet 3   pantoprazole  (PROTONIX ) 40 MG tablet Take 1 tablet (40 mg total) by mouth daily. 30 tablet 0   pravastatin  (PRAVACHOL ) 20 MG tablet Take 1 tablet (20 mg total) by mouth  every evening. 30 tablet 0   warfarin (COUMADIN ) 5 MG tablet Take 1 tablet daily or as directed by anticoagulation clinic 90 tablet 1   sucralfate  (CARAFATE ) 1 GM/10ML suspension Take 10 mLs (1 g total) by mouth 4 (four) times daily. (Patient not taking: Reported on 09/24/2024) 420 mL 1   No facility-administered medications prior to visit.    Past Medical History:  Diagnosis Date   Acute upper GI bleed 05/18/2016   Aneurysm of thoracic aorta    Ascending. 5.7 x 5.5cm - Bentall procedure   Aortic stenosis    Bioprosthetic AVR   Arthritis    Atrial fibrillation (HCC)    Documented on pacer interrogation 6/13   Complete heart block (HCC)    Status post pacemaker  placement   Coronary atherosclerosis of native coronary artery    Multivessel - LIMA to LAD, SVG to OM, SVG to PDA   DJD (degenerative joint disease)    Esophageal dysmotility    Essential hypertension    Fibromyalgia    Gout    H/O arteriovenous malformation (AVM)    H/O hiatal hernia    Hemorrhoid    History of esophageal ulcer    History of skin cancer    Hx of adenomatous colonic polyps    Iron  deficiency anemia due to chronic blood loss    Melena    Pacemaker-Medtronic    PMR (polymyalgia rheumatica)    SIRS (systemic inflammatory response syndrome) (HCC) 01/16/2015      Objective:     BP 120/64   Pulse 82   Ht 5' 5 (1.651 m)   Wt 187 lb (84.8 kg)   SpO2 94% Comment: ra  BMI 31.12 kg/m   SpO2: 94 % (ra)  amb slt hoarse wm / walks with cane   Hoarse amb wm nad    HEENT : [employer]ph[employer] clea/r nl turbinates s blood    NECK :  without  apparent JVD/ palpable Nodes/TM    LUNGS: no acc muscle use,  Nl contour chest which is clear to A and P bilaterally without cough on insp or exp maneuvers   CV:  RRR  no s3  but  2/6 HSM trace bilateral leg edema    ABD:  soft and nontender   MS:    ext warm without deformities Or obvious joint restrictions  calf tenderness, cyanosis or clubbing    SKIN: warm and dry without lesions    NEURO:  alert, approp, nl sensorium with  no motor or cerebellar deficits apparent.     I personally reviewed images and agree with radiology impression as follows:  CXR:   pa and lateral  09/15/24  1. New right infrahilar airspace opacity, suspicious for pneumonia oraspiration. 2. Left base subsegmental atelectasis or scarring, likely chronic and unchanged. 3. Stable small left pleural effusion. My comment:  R base as dz not well seen on lateral which does show mod/ severe kyphoisis though     Assessment   Assessment & Plan Hemoptysis Never smoker Echo 05/11/24  s/p AVR working ok but  has mod MR  Recurrent one week prior to ov on  coumadin  with apparent component of CHF though not classic on cxr or exam    Rec Keep INR as low as save for AVR  Will probably need diurectics adjustdsed by cardiology pending repeat labs today   HRCT looking for evidence of a localized source of recurrent bleeding (eg bronchiectasis)   If active bleeding should hold coumadin   and let cardiology know this is needed   If severe > go to [place] where FOB can be offered whereas not available at [facility]    DOE (dyspnea on exertion) - DOE Labs 09/24/2024         Each maintenance medication was reviewed in detail including emphasizing most importantly the difference between maintenance and prns and under what circumstances the prns are to be triggered using an action plan format where appropriate.  Total time for H and P, chart review, counseling,   and generating customized AVS unique to this office visit / same day charting = 35 min ACUTE eval        AVS  Patient Instructions  My office will be contacting you by phone for referral to [facility] for high def CT chest   - if you don't hear back from my office within one week please call us  back or notify us  thru MyChart and we'll address it right away.   Please remember to go to the lab department   for your tests - we will call you with the results when they are available.  Please schedule a follow up office visit in 6 weeks, call sooner if needed > DR Catherine Ozell America, MD 09/25/2024

## 2024-09-25 ENCOUNTER — Ambulatory Visit

## 2024-09-25 NOTE — Assessment & Plan Note (Signed)
-   DOE Labs 09/24/2024 : significant but chronic anemia (of chronic dz though note MCV and MCHC are all low side suggesting developing fe def)  and metabolic acidosis (probably secondary to combination of mild CRI and chf related hyperventilation (ie compensatory/ secondary met acidosis due to resp alkalosis.  Rec >>> nothing immediate to offer here but will need close f/u by cards and pulmonary going forward.

## 2024-09-25 NOTE — Assessment & Plan Note (Addendum)
 Never smoker Echo 05/11/24  s/p AVR working ok but  has mod MR  Recurrent one week prior to ov on coumadin  with apparent component of CHF though not classic on cxr or exam   Rec Keep INR as low as save for AVR  Will need continued diuretic adjustments as tol > best to let one group of providers do this >>>  cards already following  >>>  HRCT looking for evidence of a localized source of recurrent bleeding (eg bronchiectasis)   If active bleeding should hold coumadin  and let cardiology know this is needed   If severe > go to [place] where FOB can be offered whereas not available at [facility]

## 2024-09-28 DIAGNOSIS — D649 Anemia, unspecified: Secondary | ICD-10-CM | POA: Diagnosis not present

## 2024-09-28 LAB — CUP PACEART REMOTE DEVICE CHECK
Battery Impedance: 4688 Ohm
Battery Remaining Longevity: 15 mo
Battery Voltage: 2.67 V
Brady Statistic RV Percent Paced: 100 %
Date Time Interrogation Session: 20251205113502
Implantable Lead Connection Status: 753985
Implantable Lead Connection Status: 753985
Implantable Lead Implant Date: 20130412
Implantable Lead Implant Date: 20130412
Implantable Lead Location: 753859
Implantable Lead Location: 753860
Implantable Lead Model: 5076
Implantable Lead Model: 5076
Implantable Pulse Generator Implant Date: 20130412
Lead Channel Impedance Value: 490 Ohm
Lead Channel Impedance Value: 67 Ohm
Lead Channel Pacing Threshold Amplitude: 0.75 V
Lead Channel Pacing Threshold Pulse Width: 0.4 ms
Lead Channel Setting Pacing Amplitude: 2 V
Lead Channel Setting Pacing Pulse Width: 0.4 ms
Lead Channel Setting Sensing Sensitivity: 4 mV
Zone Setting Status: 755011
Zone Setting Status: 755011

## 2024-09-30 ENCOUNTER — Ambulatory Visit: Payer: Self-pay | Admitting: Internal Medicine

## 2024-09-30 LAB — CBC WITH DIFFERENTIAL/PLATELET
Basophils Absolute: 0.1 x10E3/uL (ref 0.0–0.2)
Basos: 1 %
EOS (ABSOLUTE): 0.3 x10E3/uL (ref 0.0–0.4)
Eos: 4 %
Hematocrit: 26.6 % — ABNORMAL LOW (ref 37.5–51.0)
Hemoglobin: 7.7 g/dL — ABNORMAL LOW (ref 13.0–17.7)
Immature Grans (Abs): 0 x10E3/uL (ref 0.0–0.1)
Immature Granulocytes: 0 %
Lymphocytes Absolute: 1.8 x10E3/uL (ref 0.7–3.1)
Lymphs: 24 %
MCH: 23.7 pg — ABNORMAL LOW (ref 26.6–33.0)
MCHC: 28.9 g/dL — ABNORMAL LOW (ref 31.5–35.7)
MCV: 82 fL (ref 79–97)
Monocytes Absolute: 0.6 x10E3/uL (ref 0.1–0.9)
Monocytes: 9 %
Neutrophils Absolute: 4.5 x10E3/uL (ref 1.4–7.0)
Neutrophils: 62 %
Platelets: 272 x10E3/uL (ref 150–450)
RBC: 3.25 x10E6/uL — ABNORMAL LOW (ref 4.14–5.80)
RDW: 16.8 % — ABNORMAL HIGH (ref 11.6–15.4)
WBC: 7.3 x10E3/uL (ref 3.4–10.8)

## 2024-09-30 LAB — BASIC METABOLIC PANEL WITH GFR
BUN/Creatinine Ratio: 27 — ABNORMAL HIGH (ref 10–24)
BUN: 32 mg/dL (ref 10–36)
CO2: 18 mmol/L — ABNORMAL LOW (ref 20–29)
Calcium: 9.1 mg/dL (ref 8.6–10.2)
Chloride: 107 mmol/L — ABNORMAL HIGH (ref 96–106)
Creatinine, Ser: 1.18 mg/dL (ref 0.76–1.27)
Glucose: 120 mg/dL — ABNORMAL HIGH (ref 70–99)
Potassium: 4.5 mmol/L (ref 3.5–5.2)
Sodium: 139 mmol/L (ref 134–144)
eGFR: 57 mL/min/1.73 — ABNORMAL LOW (ref >59)

## 2024-09-30 LAB — BRAIN NATRIURETIC PEPTIDE: BNP: 291.8 pg/mL — ABNORMAL HIGH (ref 0.0–100.0)

## 2024-09-30 LAB — ALPHA-1-ANTITRYPSIN PHENOTYP: A-1 Antitrypsin: 174 mg/dL (ref 101–187)

## 2024-09-30 NOTE — Progress Notes (Signed)
 Called and relayed results to pt and he confirmed understanding

## 2024-10-05 NOTE — Progress Notes (Signed)
 Remote pacemaker transmission.

## 2024-10-08 ENCOUNTER — Telehealth (INDEPENDENT_AMBULATORY_CARE_PROVIDER_SITE_OTHER): Payer: Self-pay | Admitting: Gastroenterology

## 2024-10-08 NOTE — Telephone Encounter (Signed)
 I received a call from patient's PCP regarding drop in hemoglobin noted to 7.4.  Had negative fecal occult blood test.  He is on Coumadin  and also has a history of aortic stenosis and thoracic aortic aneurysm status post Bental sx. due to concern of gastrointestinal losses, he was requesting an urgent evaluation in our clinic.  Hi Susan/Amanda,  Can you please schedule a follow up appointment for this patient in next available with Dr. Paulette or any of the APPs?  Thanks,  Toribio Fortune, MD Gastroenterology and Hepatology [facility]

## 2024-10-09 NOTE — Progress Notes (Deleted)
 "  GI Office Note    Referring Provider: Sheryle Carwin, MD Primary Care Physician:  Sheryle Carwin, MD Primary Gastroenterologist: Toribio Rubins, MD  Date:  10/09/2024  ID:  Douglas [name], DOB April 08, 1928, MRN [id number]   Chief Complaint   No chief complaint on file.  History of Present Illness  Douglas [name] is a 89 y.o. male with a history of aortic stenosis and thoracic aortic aneurysm s/p Bental surgery (aortic valve replacement), CAD s/p CABG, fibromyalgia, HTN, gout, PMR, Enterobacter bacteremia, constipation, and hemorrhoids presenting today at the request of PCP for urgent evaluation of anemia.  Colonoscopy 2008 -unclear findings  EGD 2017: - Overt bleeding noted in the stomach for large gastric AVM, epinephrine  used for hemostasis along with APC therapy.  To prevent further bleeding he had 3 hemostatic clips placed.  When seen in September 2025 he was having some upper respiratory symptoms as well as upper abdominal pain with occasional dysphagia and complaints of pressure and bleeding in the perianal area.  Noted to be taking Linzess  daily.  He was advised to get a referral to pulmonology from PCP.  Last office visit 08/17/2024.  He reported issues with constipation previously and rectal pain and thought he had hemorrhoids.  He began soaking in the warm tub and eating yogurt as well as fiber 1 bar and constipation had improved, having 2-3 bowel movements per day at that time and had been using some hemorrhoid cream but notes pain was more internally located.  At the time of visit he denied any rectal pain or bleeding.  He continued to take Linzess  145 mcg daily.  He also noted some epigastric pain for 1-2 weeks and felt as though it was improving and has had similar pain in the past.  He noted some previous weight loss but recently appetite improved and gained some weight back, taking pantoprazole  40 mg daily and working on drinking plenty of fluids.  He was recommended to  continue his Linzess , water  intake, and fiber and to notify the office if he began having any recurrent rectal pain or if epigastric pain does not resolve.  Continue Protonix  40 mg daily and gave a course of Carafate .  Telephone message from Dr. Eartha on 12/18 where he was notified from patient's PCP that he had a drop in hemoglobin to 7.4 with negative fecal occult blood test however he is on Coumadin  and concern for gastrointestinal loss and PCP requested urgent evaluation.     Latest Ref Rng & Units 09/24/2024    2:53 PM 09/15/2024   11:29 PM 01/20/2024    8:00 AM 01/15/2024    4:21 AM 01/14/2024    4:13 AM 01/13/2024   12:52 PM 01/12/2024    9:06 AM  CBC  WBC 3.4 - 10.8 x10E3/uL 7.3  11.4  12.6  11.0  9.8  11.3  10.0   Hemoglobin 13.0 - 17.7 g/dL 7.7  7.6  9.8  89.6  89.6  10.8  11.2   Hematocrit 37.5 - 51.0 % 26.6  26.3  29.7  31.9  31.0  32.9  34.6   Platelets 150 - 450 x10E3/uL 272  223  175  49  43  48  46     Since March 2025 he has had a steady downtrend of hemoglobin from the 10-11 range down to 7.7 on 12/4.  Per review of [facility] database he had a hemoglobin check on 12/8 that was 8 And a hemoglobin of 7.6 on 12/17.  Today:     Wt Readings from Last 6 Encounters:  09/24/24 187 lb (84.8 kg)  09/15/24 185 lb (83.9 kg)  08/17/24 180 lb 8 oz (81.9 kg)  07/23/24 177 lb 9.6 oz (80.6 kg)  07/23/24 176 lb 11.2 oz (80.2 kg)  07/01/24 173 lb 14.4 oz (78.9 kg)    There is no height or weight on file to calculate BMI.   Current Outpatient Medications  Medication Sig Dispense Refill   acetaminophen  (TYLENOL ) 325 MG tablet Take 2 tablets (650 mg total) by mouth every 6 (six) hours as needed for mild pain (pain score 1-3) or fever.     allopurinol  (ZYLOPRIM ) 300 MG tablet Take 1 tablet (300 mg total) by mouth daily. Take 1  daily 30 tablet 0   benzonatate  (TESSALON ) 100 MG capsule Take 1 capsule (100 mg total) by mouth 3 (three) times daily as needed for cough. 21 capsule 0    furosemide  (LASIX ) 20 MG tablet Take 1 tablet (20 mg total) by mouth every other day. 15 tablet 0   levothyroxine (SYNTHROID) 50 MCG tablet Take 50 mcg by mouth every morning.     LINZESS  145 MCG CAPS capsule Take 145 mcg by mouth daily.     metoprolol  succinate (TOPROL -XL) 25 MG 24 hr tablet Take 1 tablet (25 mg total) by mouth daily. 90 tablet 3   pantoprazole  (PROTONIX ) 40 MG tablet Take 1 tablet (40 mg total) by mouth daily. 30 tablet 0   pravastatin  (PRAVACHOL ) 20 MG tablet Take 1 tablet (20 mg total) by mouth every evening. 30 tablet 0   sucralfate  (CARAFATE ) 1 GM/10ML suspension Take 10 mLs (1 g total) by mouth 4 (four) times daily. (Patient not taking: Reported on 09/24/2024) 420 mL 1   warfarin (COUMADIN ) 5 MG tablet Take 1 tablet daily or as directed by anticoagulation clinic 90 tablet 1   No current facility-administered medications for this visit.    Past Medical History:  Diagnosis Date   Acute upper GI bleed 05/18/2016   Aneurysm of thoracic aorta    Ascending. 5.7 x 5.5cm - Bentall procedure   Aortic stenosis    Bioprosthetic AVR   Arthritis    Atrial fibrillation (HCC)    Documented on pacer interrogation 6/13   Complete heart block (HCC)    Status post pacemaker placement   Coronary atherosclerosis of native coronary artery    Multivessel - LIMA to LAD, SVG to OM, SVG to PDA   DJD (degenerative joint disease)    Esophageal dysmotility    Essential hypertension    Fibromyalgia    Gout    H/O arteriovenous malformation (AVM)    H/O hiatal hernia    Hemorrhoid    History of esophageal ulcer    History of skin cancer    Hx of adenomatous colonic polyps    Iron  deficiency anemia due to chronic blood loss    Melena    Pacemaker-Medtronic    PMR (polymyalgia rheumatica)    SIRS (systemic inflammatory response syndrome) (HCC) 01/16/2015    Past Surgical History:  Procedure Laterality Date   AORTIC VALVE REPLACEMENT  01/24/12   APPENDECTOMY     BACK SURGERY      1965 or 1966 lumbar back surgery   BENTALL PROCEDURE  01/24/2012   Procedure: BENTALL PROCEDURE;  Surgeon: Maude Fleeta Ochoa, MD;  Location: [facility];  Service: Open Heart Surgery;  Laterality: N/A;   BILATERAL KNEE ARTHROSCOPY     CATARACT EXTRACTION W/PHACO Left  12/03/2016   Procedure: CATARACT EXTRACTION PHACO AND INTRAOCULAR LENS PLACEMENT LEFT EYE;  Surgeon: Cherene Mania, MD;  Location: [facility];  Service: Ophthalmology;  Laterality: Left;  CDE:  10.87   CATARACT EXTRACTION W/PHACO Right 12/13/2016   Procedure: CATARACT EXTRACTION PHACO AND INTRAOCULAR LENS PLACEMENT (IOC);  Surgeon: Cherene Mania, MD;  Location: [facility];  Service: Ophthalmology;  Laterality: Right;  right cde 11.78   CORONARY ARTERY BYPASS GRAFT  01/24/2012   Procedure: CORONARY ARTERY BYPASS GRAFTING (CABG);  Surgeon: Maude Fleeta Ochoa, MD;  Location: [facility] OR;  Service: Open Heart Surgery;  Laterality: N/A;   ESOPHAGOGASTRODUODENOSCOPY  06/17/2012   Procedure: ESOPHAGOGASTRODUODENOSCOPY (EGD);  Surgeon: Claudis RAYMOND Rivet, MD;  Location: [facility];  Service: Endoscopy;  Laterality: N/A;  ED   ESOPHAGOGASTRODUODENOSCOPY N/A 05/19/2016   Procedure: ESOPHAGOGASTRODUODENOSCOPY (EGD);  Surgeon: Margo LITTIE Haddock, MD;  Location: [facility];  Service: Endoscopy;  Laterality: N/A;   HEMORROIDECTOMY     HERNIA REPAIR     Left inguinal hernia repair     PACEMAKER PLACEMENT     Medtronic 4/13 - Dr. Kelsie   PERMANENT PACEMAKER INSERTION N/A 02/01/2012   Procedure: PERMANENT PACEMAKER INSERTION;  Surgeon: Lynwood Kelsie, MD; Medtronic Adapta L model ADDRL 1 (serial number [id number])     TRANSESOPHAGEAL ECHOCARDIOGRAM (CATH LAB) N/A 12/23/2023   Procedure: TRANSESOPHAGEAL ECHOCARDIOGRAM;  Surgeon: Lonni Slain, MD;  Location: [facility] INVASIVE CV LAB;  Service: Cardiovascular;  Laterality: N/A;    Family History  Problem Relation Age of Onset   Kidney disease Mother    Heart disease Father    Anesthesia problems Neg Hx    Hypotension Neg Hx     Malignant hyperthermia Neg Hx    Pseudochol deficiency Neg Hx     Allergies as of 10/12/2024 - Review Complete 09/24/2024  Allergen Reaction Noted   Imdur  [isosorbide  nitrate] Other (See Comments) 12/28/2015    Social History   Socioeconomic History   Marital status: Married    Spouse name: Vernell   Number of children: 4   Years of education: Not on file   Highest education level: Not on file  Occupational History   Occupation: Retired    Comment: Engineer, maintenance  Tobacco Use   Smoking status: Never   Smokeless tobacco: Never  Vaping Use   Vaping status: Never Used  Substance and Sexual Activity   Alcohol use: No    Alcohol/week: 0.0 standard drinks of alcohol   Drug use: No   Sexual activity: Never    Birth control/protection: None  Other Topics Concern   Not on file  Social History Narrative   Lives with wife, has been married since 1954.   Social Drivers of Health   Tobacco Use: Low Risk (09/24/2024)   Patient History    Smoking Tobacco Use: Never    Smokeless Tobacco Use: Never    Passive Exposure: Not on file  Financial Resource Strain: Low Risk (02/06/2024)   Overall Financial Resource Strain (CARDIA)    Difficulty of Paying Living Expenses: Not hard at all  Food Insecurity: No Food Insecurity (03/26/2024)   Hunger Vital Sign    Worried About Running Out of Food in the Last Year: Never true    Ran Out of Food in the Last Year: Never true  Transportation Needs: No Transportation Needs (02/06/2024)   PRAPARE - Administrator, Civil Service (Medical): No    Lack of Transportation (Non-Medical): No  Physical Activity: Inactive (02/06/2024)  Exercise Vital Sign    Days of Exercise per Week: 0 days    Minutes of Exercise per Session: 10 min  Stress: No Stress Concern Present (02/06/2024)   [employer] of Occupational Health - Occupational Stress Questionnaire    Feeling of Stress : Not at all  Social Connections: Moderately Integrated (02/06/2024)    Social Connection and Isolation Panel    Frequency of Communication with Friends and Family: Twice a week    Frequency of Social Gatherings with Friends and Family: Twice a week    Attends Religious Services: 1 to 4 times per year    Active Member of [employer] or Organizations: No    Attends Banker Meetings: Never    Marital Status: Married  Recent Concern: Social Connections - Moderately Isolated (01/10/2024)   Social Connection and Isolation Panel    Frequency of Communication with Friends and Family: Never    Frequency of Social Gatherings with Friends and Family: Once a week    Attends Religious Services: 1 to 4 times per year    Active Member of [employer] or Organizations: No    Attends Banker Meetings: Never    Marital Status: Married  Depression (PHQ2-9): Low Risk (05/28/2024)   Depression (PHQ2-9)    PHQ-2 Score: 0  Alcohol Screen: Not on file  Housing: Low Risk (02/06/2024)   Housing Stability Vital Sign    Unable to Pay for Housing in the Last Year: No    Number of Times Moved in the Last Year: 0    Homeless in the Last Year: No  Utilities: Not At Risk (02/06/2024)   [employer] Utilities    Threatened with loss of utilities: No  Health Literacy: Adequate Health Literacy (02/06/2024)   B1300 Health Literacy    Frequency of need for help with medical instructions: Never    Review of Systems   Gen: Denies fever, chills, anorexia. Denies fatigue, weakness, weight loss.  CV: Denies chest pain, palpitations, syncope, peripheral edema, and claudication. Resp: Denies dyspnea at rest, cough, wheezing, coughing up blood, and pleurisy. GI: See HPI Derm: Denies rash, itching, dry skin Psych: Denies depression, anxiety, memory loss, confusion. No homicidal or suicidal ideation.  Heme: Denies bruising, bleeding, and enlarged lymph nodes.  Physical Exam   There were no vitals taken for this visit.  General:   Alert and oriented. No distress noted. Pleasant and  cooperative.  Head:  Normocephalic and atraumatic. Eyes:  Conjuctiva clear without scleral icterus. Mouth:  Oral mucosa pink and moist. Good dentition. No lesions. Lungs:  Clear to auscultation bilaterally. No wheezes, rales, or rhonchi. No distress.  Heart:  S1, S2 present without murmurs appreciated.  Abdomen:  +BS, soft, non-tender and non-distended. No rebound or guarding. No HSM or masses noted. Rectal: *** Msk:  Symmetrical without gross deformities. Normal posture. Extremities:  Without edema. Neurologic:  Alert and  oriented x4 Psych:  Alert and cooperative. Normal mood and affect.  Assessment & Plan  LEXUS BARLETTA is a 89 y.o. male presenting today with ***  Given the downtrend of hemoglobin, and history of gastric AVM would recommend at minimum EGD for further evaluation but ultimately would benefit from bidirectional endoscopic evaluation with EGD and colonoscopy.***  Follow up   Follow up ***    Charmaine Melia, MSN, FNP-BC, AGACNP-BC [facility] "

## 2024-10-12 ENCOUNTER — Encounter: Admitting: Gastroenterology

## 2024-10-12 ENCOUNTER — Ambulatory Visit (INDEPENDENT_AMBULATORY_CARE_PROVIDER_SITE_OTHER): Admitting: Gastroenterology

## 2024-10-12 ENCOUNTER — Encounter (INDEPENDENT_AMBULATORY_CARE_PROVIDER_SITE_OTHER): Payer: Self-pay | Admitting: Gastroenterology

## 2024-10-12 VITALS — BP 135/66 | HR 81 | Temp 97.5°F | Ht 65.0 in | Wt 186.7 lb

## 2024-10-12 DIAGNOSIS — D509 Iron deficiency anemia, unspecified: Secondary | ICD-10-CM | POA: Diagnosis not present

## 2024-10-12 NOTE — Patient Instructions (Signed)
 Please continue taking iron  pill, increase this to twice a day I want you to go have labs drawn on Monday 12/29 to recheck blood counts If you have worsening weakness, shortness of breath, dizziness or any episodes of passing out, you vomiting blood or have blood in your stool, you need further evaluation in the ER  We will get you scheduled for EGD once we have clearance from cardiology   Follow up 2 months

## 2024-10-12 NOTE — Progress Notes (Signed)
 "  GI Office Note    Referring Provider: Sheryle Carwin, MD Primary Care Physician:  Sheryle Carwin, MD Primary Gastroenterologist: Dr. Eartha   Date:  10/12/2024  ID:  Douglas [name], DOB [birth year], MRN [id number]   Chief Complaint   Chief Complaint  Patient presents with   Anemia    FIRST AVAILABLE WITH ANY APP OR DR CARVER OR DR AHMED. drop in hemoglobin noted to 7.4. Had negative fecal occult blood test. He is on Coumadin  and also has a history of aortic stenosis and thoracic aortic aneurysm status post Bental sx. due to concern of gastrointestinal losses, he was requesting an urgent evaluation in our clinic.   History of Present Illness  Douglas [name] is a 89 y.o. male with a history of aortic stenosis and thoracic aortic aneurysm s/p Bental surgery (aortic valve replacement), CAD s/p CABG, fibromyalgia, HTN, gout, PMR, Enterobacter bacteremia, constipation, and hemorrhoids presenting today at the request of PCP for urgent evaluation of anemia.  Last office visit 08/17/2024. Having issues with constipation previously and rectal pain and thought he had hemorrhoids.  He began soaking in the warm tub and eating yogurt as well as fiber 1 bar and constipation had improved, having 2-3 bowel movements per day at that time and had been using some hemorrhoid cream but notes pain was more internally located.  taking Linzess  145 mcg daily.  endorsed epigastric pain for 1-2 weeks and felt as though it was improving and has had similar pain in the past.  taking pantoprazole  40 mg daily and working on drinking plenty of fluids.    He was recommended to continue his Linzess , water  intake, and fiber and to notify the office if he began having any recurrent rectal pain or if epigastric pain does not resolve.  Continue Protonix  40 mg daily and gave a course of Carafate .  Telephone message from Dr. Eartha on 12/18 where he was notified from patient's PCP that he had a drop in hemoglobin to 7.4 with negative  fecal occult blood test however he is on Coumadin  and concern for gastrointestinal loss and PCP requested urgent evaluation.  Since March 2025 he has had a steady downtrend of hemoglobin from the 10-11 range down to 7.7 on 12/4.  Per review of [facility] database  hemoglobin on 12/8 was 8 hemoglobin of 7.6 on 12/17.  B12 in November was 624 folate 11.5 Iron  17, TIBC 419 sat 4 ferritin 12  Present:  Patient endorse some weakness for the past few months with more fatigue. He has no SOB or dizziness. Patient reported 3-4 weeks ago he had some black stools. States he went to the ED around that time for a cough and hgb was in the 7 range. He noted some darker stools this morning, but unclear if this was black. He had a few episodes of coughing up some brown and yellow looking phlegm, he denies any coffee ground emesis or hematemesis. He has had an occasional episodes of epigastric pain that tums seemed to relieve. Denies any changes to his appetite or early satiety. He actually feels appetite has improved over the past 2-3 months and he has gained some weight. No nausea or vomiting. Denies issues with constipation or diarrhea currently. Denies any new meds to include antibiotics or steroids over the past few months. He denies over dysphagia but reports sensation of a pocket in his throat where food or pills occasionally. Denies any heartburn or acid reflux. He takes coumadin  but denies NSAID use.  No etoh or tobacco use. He started taking PO iron  about 3 days ago   He reports a sacral ulcer he has had for about 3 years, he is seeing wound care and applying a salve. Notes some discomfort when sitting. He states lesions has improved some.   Colonoscopy 2008 -unclear findings  EGD 2017: - Overt bleeding noted in the stomach for large gastric AVM, epinephrine  used for hemostasis along with APC therapy.  To prevent further bleeding he had 3 hemostatic clips placed.  Wt Readings from Last 6 Encounters:   10/12/24 186 lb 11.2 oz (84.7 kg)  09/24/24 187 lb (84.8 kg)  09/15/24 185 lb (83.9 kg)  08/17/24 180 lb 8 oz (81.9 kg)  07/23/24 177 lb 9.6 oz (80.6 kg)  07/23/24 176 lb 11.2 oz (80.2 kg)    Body mass index is 31.07 kg/m.   Current Outpatient Medications  Medication Sig Dispense Refill   acetaminophen  (TYLENOL ) 325 MG tablet Take 2 tablets (650 mg total) by mouth every 6 (six) hours as needed for mild pain (pain score 1-3) or fever.     allopurinol  (ZYLOPRIM ) 300 MG tablet Take 1 tablet (300 mg total) by mouth daily. Take 1  daily 30 tablet 0   furosemide  (LASIX ) 20 MG tablet Take 1 tablet (20 mg total) by mouth every other day. (Patient taking differently: Take 20 mg by mouth daily.) 15 tablet 0   levothyroxine (SYNTHROID) 50 MCG tablet Take 50 mcg by mouth every morning.     LINZESS  145 MCG CAPS capsule Take 145 mcg by mouth daily.     metoprolol  succinate (TOPROL -XL) 25 MG 24 hr tablet Take 1 tablet (25 mg total) by mouth daily. 90 tablet 3   pantoprazole  (PROTONIX ) 40 MG tablet Take 1 tablet (40 mg total) by mouth daily. 30 tablet 0   pravastatin  (PRAVACHOL ) 20 MG tablet Take 1 tablet (20 mg total) by mouth every evening. 30 tablet 0   warfarin (COUMADIN ) 5 MG tablet Take 1 tablet daily or as directed by anticoagulation clinic 90 tablet 1   sucralfate  (CARAFATE ) 1 GM/10ML suspension Take 10 mLs (1 g total) by mouth 4 (four) times daily. (Patient not taking: Reported on 10/12/2024) 420 mL 1   No current facility-administered medications for this visit.    Past Medical History:  Diagnosis Date   Acute upper GI bleed 05/18/2016   Aneurysm of thoracic aorta    Ascending. 5.7 x 5.5cm - Bentall procedure   Aortic stenosis    Bioprosthetic AVR   Arthritis    Atrial fibrillation (HCC)    Documented on pacer interrogation 6/13   Complete heart block (HCC)    Status post pacemaker placement   Coronary atherosclerosis of native coronary artery    Multivessel - LIMA to LAD, SVG to  OM, SVG to PDA   DJD (degenerative joint disease)    Esophageal dysmotility    Essential hypertension    Fibromyalgia    Gout    H/O arteriovenous malformation (AVM)    H/O hiatal hernia    Hemorrhoid    History of esophageal ulcer    History of skin cancer    Hx of adenomatous colonic polyps    Iron  deficiency anemia due to chronic blood loss    Melena    Pacemaker-Medtronic    PMR (polymyalgia rheumatica)    SIRS (systemic inflammatory response syndrome) (HCC) 01/16/2015    Past Surgical History:  Procedure Laterality Date   AORTIC VALVE REPLACEMENT  01/24/12  APPENDECTOMY     BACK SURGERY     1965 or 1966 lumbar back surgery   BENTALL PROCEDURE  01/24/2012   Procedure: BENTALL PROCEDURE;  Surgeon: Maude Fleeta Ochoa, MD;  Location: [facility] OR;  Service: Open Heart Surgery;  Laterality: N/A;   BILATERAL KNEE ARTHROSCOPY     CATARACT EXTRACTION W/PHACO Left 12/03/2016   Procedure: CATARACT EXTRACTION PHACO AND INTRAOCULAR LENS PLACEMENT LEFT EYE;  Surgeon: Cherene Mania, MD;  Location: [facility] ORS;  Service: Ophthalmology;  Laterality: Left;  CDE:  10.87   CATARACT EXTRACTION W/PHACO Right 12/13/2016   Procedure: CATARACT EXTRACTION PHACO AND INTRAOCULAR LENS PLACEMENT (IOC);  Surgeon: Cherene Mania, MD;  Location: [facility] OR[facility];  Service: Ophthalmology;  Laterality: Right;  right cde 11.78   CORONARY ARTERY BYPASS GRAFT  01/24/2012   Procedure: CORONARY ARTERY BYPASS GRAFTING (CABG);  Surgeon: Maude Fleeta Ochoa, MD;  Location: [facility] OR;  Service: Open Heart Surgery;  Laterality: N/A;   ESOPHAGOGASTRODUODENOSCOPY  06/17/2012   Procedure: ESOPHAGOGASTRODUODENOSCOPY (EGD);  Surgeon: Claudis RAYMOND Rivet, MD;  Location: [facility] END[facility]ITE;  Service: Endoscopy;  Laterality: N/A;  ED   ESOPHAGOGASTRODUODENOSCOPY N/A 05/19/2016   Procedure: ESOPHAGOGASTRODUODENOSCOPY (EGD);  Surgeon: Margo LITTIE Haddock, MD;  Location: [facility];  Service: Endoscopy;  Laterality: N/A;   HEMORROIDECTOMY     HERNIA REPAIR     Left inguinal hernia  repair     PACEMAKER PLACEMENT     Medtronic 4/13 - Dr. Kelsie   PERMANENT PACEMAKER INSERTION N/A 02/01/2012   Procedure: PERMANENT PACEMAKER INSERTION;  Surgeon: Lynwood Kelsie, MD; Medtronic Adapta L model ADDRL 1 (serial number [id number])     TRANSESOPHAGEAL ECHOCARDIOGRAM (CATH LAB) N/A 12/23/2023   Procedure: TRANSESOPHAGEAL ECHOCARDIOGRAM;  Surgeon: Lonni Slain, MD;  Location: [facility] INVASIVE CV LAB;  Service: Cardiovascular;  Laterality: N/A;    Family History  Problem Relation Age of Onset   Kidney disease Mother    Heart disease Father    Anesthesia problems Neg Hx    Hypotension Neg Hx    Malignant hyperthermia Neg Hx    Pseudochol deficiency Neg Hx     Allergies as of 10/12/2024 - Review Complete 10/12/2024  Allergen Reaction Noted   Imdur  [isosorbide  nitrate] Other (See Comments) 12/28/2015    Social History   Socioeconomic History   Marital status: Married    Spouse name: Vernell   Number of children: 4   Years of education: Not on file   Highest education level: Not on file  Occupational History   Occupation: Retired    Comment: Engineer, maintenance  Tobacco Use   Smoking status: Never   Smokeless tobacco: Never  Vaping Use   Vaping status: Never Used  Substance and Sexual Activity   Alcohol use: No    Alcohol/week: 0.0 standard drinks of alcohol   Drug use: No   Sexual activity: Never    Birth control/protection: None  Other Topics Concern   Not on file  Social History Narrative   Lives with wife, has been married since 1954.   Social Drivers of Health   Tobacco Use: Low Risk (10/12/2024)   Patient History    Smoking Tobacco Use: Never    Smokeless Tobacco Use: Never    Passive Exposure: Not on file  Financial Resource Strain: Low Risk (02/06/2024)   Overall Financial Resource Strain (CARDIA)    Difficulty of Paying Living Expenses: Not hard at all  Food Insecurity: No Food Insecurity (03/26/2024)   Hunger Vital Sign  Worried About Patent Examiner in the Last Year: Never true    Ran Out of Food in the Last Year: Never true  Transportation Needs: No Transportation Needs (02/06/2024)   PRAPARE - Administrator, Civil Service (Medical): No    Lack of Transportation (Non-Medical): No  Physical Activity: Inactive (02/06/2024)   Exercise Vital Sign    Days of Exercise per Week: 0 days    Minutes of Exercise per Session: 10 min  Stress: No Stress Concern Present (02/06/2024)   [employer] of Occupational Health - Occupational Stress Questionnaire    Feeling of Stress : Not at all  Social Connections: Moderately Integrated (02/06/2024)   Social Connection and Isolation Panel    Frequency of Communication with Friends and Family: Twice a week    Frequency of Social Gatherings with Friends and Family: Twice a week    Attends Religious Services: 1 to 4 times per year    Active Member of [employer] or Organizations: No    Attends Banker Meetings: Never    Marital Status: Married  Recent Concern: Social Connections - Moderately Isolated (01/10/2024)   Social Connection and Isolation Panel    Frequency of Communication with Friends and Family: Never    Frequency of Social Gatherings with Friends and Family: Once a week    Attends Religious Services: 1 to 4 times per year    Active Member of [employer] or Organizations: No    Attends Banker Meetings: Never    Marital Status: Married  Depression (PHQ2-9): Low Risk (05/28/2024)   Depression (PHQ2-9)    PHQ-2 Score: 0  Alcohol Screen: Not on file  Housing: Low Risk (02/06/2024)   Housing Stability Vital Sign    Unable to Pay for Housing in the Last Year: No    Number of Times Moved in the Last Year: 0    Homeless in the Last Year: No  Utilities: Not At Risk (02/06/2024)   AHC Utilities    Threatened with loss of utilities: No  Health Literacy: Adequate Health Literacy (02/06/2024)   B1300 Health Literacy    Frequency of need for help with medical  instructions: Never    Review of Systems   Gen: Denies fever, chills, anorexia. Denies fatigue, weakness, weight loss.  CV: Denies chest pain, palpitations, syncope, peripheral edema, and claudication. Resp: Denies dyspnea at rest, cough, wheezing, coughing up blood, and pleurisy. GI: See HPI Derm: Denies rash, itching, dry skin Psych: Denies depression, anxiety, memory loss, confusion. No homicidal or suicidal ideation.  Heme: Denies bruising, bleeding, and enlarged lymph nodes.  Physical Exam   BP 135/66 (BP Location: Left Arm, Patient Position: Sitting, Cuff Size: Normal)   Pulse 81   Temp (!) 97.5 F (36.4 C) (Temporal)   Ht 5' 5 (1.651 m)   Wt 186 lb 11.2 oz (84.7 kg)   BMI 31.07 kg/m   General:   Alert and oriented. No distress noted. Pleasant and cooperative.  Head:  Normocephalic and atraumatic. Eyes:  Conjuctiva clear without scleral icterus. Mouth:  Oral mucosa pink and moist. Good dentition. No lesions. Lungs:  Clear to auscultation bilaterally. No wheezes, rales, or rhonchi. No distress.  Heart:  S1, S2 present without murmurs appreciated.  Abdomen:  +BS, soft, non-tender and non-distended. No rebound or guarding. No HSM or masses noted. Msk:  Symmetrical without gross deformities. Normal posture. Extremities:  Without edema. Neurologic:  Alert and  oriented x4 Psych:  Alert and  cooperative. Normal mood and affect.  Assessment & Plan  Douglas [name] is a 89 y.o. male presenting today for IDA   Notable decline in hemoglobin over the last few months from baseline 9-10 range to 7 range. He had negative occult stool card done recently. B12/folate WNL with low Iron  at the end of November. He endorses some black stools here and there as well as some epigastric discomfort at times, coughed up some brown colored phlegm a few times though no overt BRBPR, hematemesis or coffee ground emesis. Endorses some fatigue. Appetite Is good. He is maintained on coumadin  but takes no  NSAIDs. Given the downtrend of hemoglobin, and history of gastric AVM would recommend at minimum EGD for further evaluation but ultimately may need bidirectional endoscopic evaluation with EGD and colonoscopy. Given age and multi morbidities with melena, will start with pursuing EGD and consider colonoscopy if no findings in UGI tract to explain his IDA. He will require cardiac clearance to proceed and to hold coumadin  prior to his procedure. Indications, risks and benefits of procedure discussed in detail with patient. Patient verbalized understanding and is in agreement to proceed with EGD.  -continue PPI -increase Iron  to BID -schedule EGD ASA III as long as cardiology clears patient for procedure and hold of coumadin  prior to -ER precautions given -repeat h&h on 12/29   Follow up   Follow up 2 months    Sunnie Odden L. Mariette, MSN, APRN, AGNP-C Adult-Gerontology Nurse Practitioner [facility]astroenterology at [facility]  I have reviewed the note and agree with the APP's assessment as described in this progress note  Toribio Fortune, MD Gastroenterology and Hepatology [facility] Gastroenterology  "

## 2024-10-13 ENCOUNTER — Telehealth: Payer: Self-pay | Admitting: *Deleted

## 2024-10-13 ENCOUNTER — Encounter: Payer: Self-pay | Admitting: Internal Medicine

## 2024-10-13 ENCOUNTER — Ambulatory Visit: Attending: Cardiology | Admitting: *Deleted

## 2024-10-13 DIAGNOSIS — Z952 Presence of prosthetic heart valve: Secondary | ICD-10-CM | POA: Diagnosis present

## 2024-10-13 DIAGNOSIS — Z5181 Encounter for therapeutic drug level monitoring: Secondary | ICD-10-CM | POA: Diagnosis present

## 2024-10-13 DIAGNOSIS — I480 Paroxysmal atrial fibrillation: Secondary | ICD-10-CM | POA: Insufficient documentation

## 2024-10-13 LAB — POCT INR: INR: 2.8 (ref 2.0–3.0)

## 2024-10-13 MED ORDER — WARFARIN SODIUM 5 MG PO TABS: ORAL_TABLET | ORAL | 1 refills | Status: AC

## 2024-10-13 NOTE — Patient Instructions (Signed)
 Continue warfarin 1 tablet daily except 1/2 tablet on Sundays and Wednesdays Recheck in 4 wks Continue greens.

## 2024-10-13 NOTE — Telephone Encounter (Signed)
" °  Request for patient to stop medication prior to procedure and is needing medical cleareance  10/13/2024  Douglas [name] [birth year]  What type of surgery is being performed? EGD  When is surgery scheduled? TBD  What type of clearance is required (medical or pharmacy to hold medication or both? BOTH  Are there any medications that need to be held prior to surgery and how long? COUMADIN  X 5 DAYS PRIOR  Name of physician performing surgery?  Dr. Cinderella Rouse Gastroenterology at [facility] Phone: [phone], option 5 Fax: [phone]  Anesthesia type (none, local, MAC, general)? MAC   "

## 2024-10-13 NOTE — Telephone Encounter (Signed)
 Dr. Waddell,  You saw this patient on 07/23/2024. Per office protocol, will you please comment on medical clearance for colonoscopy?  Please route your response to P CV DIV Preop. I will communicate with requesting office once you have given recommendations.   Thank you!  Barnie Hila, NP

## 2024-10-13 NOTE — Telephone Encounter (Signed)
 Please advise holding coumadin  prior to colonoscopy not yet scheduled.  Last labs December 2025  Thank you!  DW

## 2024-10-13 NOTE — Progress Notes (Signed)
 PERIOPERATIVE PRESCRIPTION FOR IMPLANTED CARDIAC DEVICE PROGRAMMING  Patient Information: Name:  Douglas [name]  DOB:  08-24-1928  MRN:  [id number]  What type of surgery is being performed? EGD   When is surgery scheduled? TBD   What type of clearance is required (medical or pharmacy to hold medication or both? BOTH   Are there any medications that need to be held prior to surgery and how long? COUMADIN  X 5 DAYS PRIOR   Name of physician performing surgery?  Dr. Cinderella [name] Gastroenterology at [facility] Phone: [phone], option 5 Fax: [phone]   Anesthesia type (none, local, MAC, general)? MAC Device Information:  Clinic EP Physician:  Douglas Birmingham, MD   Device Type:  Pacemaker Manufacturer and Phone #:  Medtronic: [id number] Pacemaker Dependent?:  Yes.   Date of Last Device Check:  09/25/2024 Normal Device Function?:  Yes.    Electrophysiologist's Recommendations:  Have magnet available. Provide continuous ECG monitoring when magnet is used or reprogramming is to be performed.  Procedure may interfere with device function.  Magnet should be placed over device during procedure.  Per Device Clinic Standing Orders, Douglas DELENA Sharps, RN  4:02 PM 10/13/2024

## 2024-10-13 NOTE — Progress Notes (Signed)
 INR-2.8 Please see anticoagulation encounter

## 2024-10-14 ENCOUNTER — Ambulatory Visit: Payer: Self-pay | Admitting: Student in an Organized Health Care Education/Training Program

## 2024-10-14 NOTE — Telephone Encounter (Signed)
 Patient with diagnosis of Afib + AVR on warfarin for anticoagulation.    Procedure: EGD Date of procedure: TBD   CHA2DS2-VASc Score = 5  This indicates a 7.2% annual risk of stroke. The patient's score is based upon: CHF History: 1 HTN History: 1 Diabetes History: 0 Stroke History: 0 Vascular Disease History: 1 Age Score: 2 Gender Score: 0     CrCl 46 ml/min Platelet count 272k  Patient has not  had an Afib/aflutter ablation in the last 3 months, DCCV within the last 4 weeks or a watchman implanted in the last 45 days    Per office protocol, patient can hold warfarin for 5 days prior to procedure.    Patient will not need bridging with Lovenox (enoxaparin) around procedure.  **This guidance is not considered finalized until pre-operative APP has relayed final recommendations.**

## 2024-10-17 ENCOUNTER — Ambulatory Visit (HOSPITAL_COMMUNITY)
Admission: RE | Admit: 2024-10-17 | Discharge: 2024-10-17 | Disposition: A | Discharge status: Home / Self Care | Source: Ambulatory Visit | Attending: Internal Medicine | Admitting: Internal Medicine

## 2024-10-17 DIAGNOSIS — R0609 Other forms of dyspnea: Secondary | ICD-10-CM | POA: Insufficient documentation

## 2024-10-20 LAB — HEMOGLOBIN AND HEMATOCRIT, BLOOD
Hematocrit: 27.3 % — ABNORMAL LOW (ref 37.5–51.0)
Hemoglobin: 8.1 g/dL — ABNORMAL LOW (ref 13.0–17.7)

## 2024-10-26 ENCOUNTER — Ambulatory Visit

## 2024-10-27 ENCOUNTER — Ambulatory Visit

## 2024-10-27 ENCOUNTER — Ambulatory Visit (INDEPENDENT_AMBULATORY_CARE_PROVIDER_SITE_OTHER): Payer: Self-pay | Admitting: Gastroenterology

## 2024-10-28 ENCOUNTER — Ambulatory Visit: Payer: Self-pay | Admitting: Student in an Organized Health Care Education/Training Program

## 2024-10-28 LAB — CUP PACEART REMOTE DEVICE CHECK
Battery Impedance: 4734 Ohm
Battery Remaining Longevity: 15 mo
Battery Voltage: 2.66 V
Brady Statistic RV Percent Paced: 99 %
Date Time Interrogation Session: 20260107085759
Implantable Lead Connection Status: 753985
Implantable Lead Connection Status: 753985
Implantable Lead Implant Date: 20130412
Implantable Lead Implant Date: 20130412
Implantable Lead Location: 753859
Implantable Lead Location: 753860
Implantable Lead Model: 5076
Implantable Lead Model: 5076
Implantable Pulse Generator Implant Date: 20130412
Lead Channel Impedance Value: 499 Ohm
Lead Channel Impedance Value: 67 Ohm
Lead Channel Pacing Threshold Amplitude: 0.75 V
Lead Channel Pacing Threshold Pulse Width: 0.4 ms
Lead Channel Setting Pacing Amplitude: 2 V
Lead Channel Setting Pacing Pulse Width: 0.4 ms
Lead Channel Setting Sensing Sensitivity: 4 mV
Zone Setting Status: 755011
Zone Setting Status: 755011

## 2024-10-29 ENCOUNTER — Ambulatory Visit (INDEPENDENT_AMBULATORY_CARE_PROVIDER_SITE_OTHER): Admitting: Pulmonary Disease

## 2024-10-29 ENCOUNTER — Telehealth: Payer: Self-pay | Admitting: Gastroenterology

## 2024-10-29 ENCOUNTER — Encounter: Payer: Self-pay | Admitting: Pulmonary Disease

## 2024-10-29 VITALS — BP 139/67 | HR 71 | Ht 65.0 in | Wt 191.0 lb

## 2024-10-29 DIAGNOSIS — J189 Pneumonia, unspecified organism: Secondary | ICD-10-CM | POA: Diagnosis not present

## 2024-10-29 DIAGNOSIS — R042 Hemoptysis: Secondary | ICD-10-CM

## 2024-10-29 DIAGNOSIS — I5042 Chronic combined systolic (congestive) and diastolic (congestive) heart failure: Secondary | ICD-10-CM | POA: Diagnosis not present

## 2024-10-29 DIAGNOSIS — J4520 Mild intermittent asthma, uncomplicated: Secondary | ICD-10-CM | POA: Diagnosis not present

## 2024-10-29 DIAGNOSIS — R0609 Other forms of dyspnea: Secondary | ICD-10-CM

## 2024-10-29 MED ORDER — ALBUTEROL SULFATE (2.5 MG/3ML) 0.083% IN NEBU: 2.5000 mg | INHALATION_SOLUTION | Freq: Four times a day (QID) | RESPIRATORY_TRACT | 5 refills | Status: AC | PRN

## 2024-10-29 MED ORDER — LEVOFLOXACIN 750 MG PO TABS: 750.0000 mg | ORAL_TABLET | Freq: Every day | ORAL | 0 refills | Status: AC

## 2024-10-29 NOTE — Assessment & Plan Note (Signed)
 Multifactorial due to anemia, chronic diastolic heart failure with volume overload, as well as airways disease. He has evidence of borderline obstruction on prior PFTs and is wheezing today. I advised him to continue diuretic regimen and to start albuterol  neb as per below.

## 2024-10-29 NOTE — Assessment & Plan Note (Signed)
 Resolved

## 2024-10-29 NOTE — Telephone Encounter (Signed)
 I tried calling no answer. I left a Voice message asking the patient to please return call to the office.   Per Chelsea's message via My Chart to the patient, Douglas [name], Douglas [name] is slightly better. Please continue taking your iron  pill twice daily.

## 2024-10-29 NOTE — Patient Instructions (Addendum)
" °  VISIT SUMMARY: You visited us  today due to a history of coughing up blood and shortness of breath. We identified a small area of pneumonia in your right middle lung lobe and discussed your ongoing issues with asthma and leg swelling.  YOUR PLAN: COMMUNITY-ACQUIRED PNEUMONIA, RIGHT MIDDLE LOBE: A small area of pneumonia was found in your right middle lung lobe, likely walking pneumonia. -You have been prescribed an antibiotic for 5 days. -We will repeat a CT scan in a few months to ensure the pneumonia has resolved.  HEMOPTYSIS: You are no longer coughing up blood, but you occasionally produce brownish-yellow sputum, especially in the mornings.  MILD INTERMITTENT ASTHMA: You have wheezing, especially on the right side. -A nebulizer and breathing solutions have been ordered for you to use once or twice a day.  DYSPNEA ON EXERTION: You experience shortness of breath, which may be related to your pneumonia and wheezing. Swelling in your legs may also contribute to this. -Monitor your weight. If you gain 3 pounds overnight or 5 pounds within a week, take an extra dose of your diuretic.   Contains text generated by [employer].  "

## 2024-10-29 NOTE — Progress Notes (Signed)
 "  Established Patient Pulmonology Office Visit   Subjective:  Patient ID: Douglas [name], male    DOB: Jul 27, 1928  MRN: [id number]  CC:  Chief Complaint  Patient presents with   Shortness of Breath    Some coughing / shob-  hrct 12/29      HPI  Douglas [name] is a 89 y.o. male with PMH significant for atrial fibrillation on coumadin , multivessel CAD s/p CABG, HFmrEF who presents for follow up.  Initially seen in our office for hemoptysis on 10/2. Thought to be due to CHF. Came for acute visit in the setting of dyspnea with Dr. LELON. Ordered HRCT to evaluate for possible bronchiectasis as a source of hemoptysis.  Discussed the use of AI scribe software for clinical note transcription with the patient, who gave verbal consent to proceed.  History of Present Illness Douglas [name] is a 89 year old male who presents with a history of coughing up blood and shortness of breath.  He initially experienced hemoptysis, which has since improved. He is no longer coughing up blood but occasionally produces brownish-yellow sputum, particularly in the mornings. This has been less frequent over the past two to three days. No fevers, chills, or night sweats.  He experiences shortness of breath and describes uncertainty about what is typical for his age. He mentions a history of low hemoglobin and ongoing efforts to increase it. He is currently on Coumadin .  He reports swelling in his legs and is taking a diuretic to manage this.  ROS   Current Medications[1]      Objective:  BP 139/67   Pulse 71   Ht 5' 5 (1.651 m)   Wt 191 lb (86.6 kg)   SpO2 100% Comment: ra  BMI 31.78 kg/m  Wt Readings from Last 3 Encounters:  10/29/24 191 lb (86.6 kg)  10/12/24 186 lb 11.2 oz (84.7 kg)  09/24/24 187 lb (84.8 kg)   BMI Readings from Last 3 Encounters:  10/29/24 31.78 kg/m  10/12/24 31.07 kg/m  09/24/24 31.12 kg/m   SpO2 Readings from Last 3 Encounters:  10/29/24 100%  09/24/24 94%  09/16/24  98%   Physical Exam General: NAD, alert, WD, WN Eyes: PERRL, no scleral icterus ENMT: oropharynx clear, good dentition, no oral lesions, mallampati score IV Skin: warm, intact, no rashes Neck: JVD elevated, ROM and lymph node assessment normal CV: RRR, no MRG, nl S1 and S2, 2+ pitting peripheral edema Resp: expiratory wheezing in right lung, left lung clear. Neuro: Awake alert oriented to person place time and situation  Diagnostic Review:  Last CBC Lab Results  Component Value Date   WBC 7.3 09/24/2024   HGB 8.1 (L) 10/19/2024   HCT 27.3 (L) 10/19/2024   MCV 82 09/24/2024   MCH 23.7 (L) 09/24/2024   RDW 16.8 (H) 09/24/2024   PLT 272 09/24/2024   Last metabolic panel Lab Results  Component Value Date   GLUCOSE 120 (H) 09/24/2024   NA 139 09/24/2024   K 4.5 09/24/2024   CL 107 (H) 09/24/2024   CO2 18 (L) 09/24/2024   BUN 32 09/24/2024   CREATININE 1.18 09/24/2024   EGFR 57 (L) 09/24/2024   CALCIUM  9.1 09/24/2024   PHOS 3.2 01/16/2024   PROT 7.2 09/15/2024   ALBUMIN  3.9 09/15/2024   LABGLOB 3.0 08/29/2023   AGRATIO 1.1 10/03/2016   BILITOT 0.5 09/15/2024   ALKPHOS 139 (H) 09/15/2024   AST 20 09/15/2024   ALT 11 09/15/2024   ANIONGAP  11 09/15/2024   CT chest 04/23/2024: IMPRESSION: 1. Stable appearance status post aortic valve replacement and aortic root replacement. There is aneurysmal disease of the native distal ascending thoracic aorta above the aortic root graft measuring up to approximately 4.7 cm. This appears to have increased slightly since 2022 where the same segment measured approximately 4.5 cm. Ascending thoracic aortic aneurysm. Recommend semi-annual imaging followup by CTA or MRA and referral to cardiothoracic surgery if not already obtained. This recommendation follows 2010 ACCF/AHA/AATS/ACR/ASA/SCA/SCAI/SIR/STS/SVM Guidelines for the Diagnosis and Management of Patients With Thoracic Aortic Disease. Circulation. 2010; 121: [id number] 2. Stable  cardiac enlargement, diffuse coronary atherosclerosis and pacemaker lead positioning. 3. Stable dilated central pulmonary arteries with the main pulmonary artery measuring up to approximately 3.8 cm. 4. Stable small scattered pulmonary nodules since 2022. These are benign based on stability over several years. 5. Posterior pleural thickening in the lower left hemithorax is slightly more prominent towards the lung base. Associated scarring and architectural distortion in the left lower lobe again noted.   PFTs 01/2012: borderline obstruction.  HRCT 10/19/2024: IMPRESSION: 1. No evidence of interstitial lung disease. Air trapping is indicative of small airways disease. 2. Mild congestive heart failure. Difficult to exclude superimposed bronchopneumonia. 3. Slight enlargement of mediastinal lymph nodes, likely reactive. 4. Chronic small left pleural effusion with volume loss in the left lower lobe. 5. 4.5 cm ascending aortic aneurysm. 6. Cirrhosis. 7. Left adrenal adenoma. 8. Aortic atherosclerosis (ICD10-I70.0). Coronary artery calcification. 9. Enlarged pulmonic trunk, indicative of pulmonary arterial hypertension.  Echo 04/2024: 1. Left ventricular ejection fraction, by estimation, is 55 to 60%. The  left ventricle has normal function. The left ventricle has no regional  wall motion abnormalities. There is mild concentric left ventricular  hypertrophy. Left ventricular diastolic  parameters are indeterminate.   2. Right ventricular systolic function is low normal. The right  ventricular size is moderately enlarged. There is moderately elevated  pulmonary artery systolic pressure. The estimated right ventricular  systolic pressure is 47.9 mmHg.   3. Left atrial size was mildly dilated.   4. Right atrial size was mildly dilated.   5. The mitral valve is degenerative. Moderate mitral valve regurgitation.  The mean mitral valve gradient is 3.0 mmHg.   6. Tricuspid valve  regurgitation is moderate.   7. The aortic valve has been repaired/replaced. Aortic valve  regurgitation is not visualized. There is a 23 mm Edwards pericardial  valve present in the aortic position. Aortic valve mean gradient measures  2.0 mmHg.   8. History of Bentall procedure with ascending aortic 26 mm Gelweave  Valsalva graft.   9. The inferior vena cava is normal in size with greater than 50%  respiratory variability, suggesting right atrial pressure of 3 mmHg.     Assessment & Plan:   Assessment & Plan Hemoptysis Resolved. DOE (dyspnea on exertion) Multifactorial due to anemia, chronic diastolic heart failure with volume overload, as well as airways disease. He has evidence of borderline obstruction on prior PFTs and is wheezing today. I advised him to continue diuretic regimen and to start albuterol  neb as per below. Mild intermittent reactive airway disease without complication - Ordered nebulizer and albuterol  breathing solutions for use twice a day. Community acquired pneumonia of right middle lobe of lung Community-acquired pneumonia, right middle lobe HRCT shows a small area of consolidation in the right middle lobe along with productive cough of brownish/greenish sputum suggests walking pneumonia. Will treat with levofloxacin  for 5 days and repeat chest imaging in  two to three months to confirm resolution. - Prescribed levofloxacin  for 5 days. - Will repeat CT scan in a few months to ensure resolution of pneumonia. Chronic combined systolic and diastolic congestive heart failure (HCC) Chest imaging concerning for mild pulmonary edema. On examination, the patient appears slightly hypervolemic. 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 - Continue with diuretics as directed by cardiology - Monitor weight; if weight increases by 3 pounds overnight or 5 pounds within a week, take an extra dose  of diuretic.  Orders Placed This Encounter  Procedures   CT Chest Wo Contrast   AMB REFERRAL FOR DME   I spent 31 minutes reviewing patient's chart including prior consultant notes, imaging, and PFTs as well as face-to-face with the patient, over half in discussion of the diagnosis and the importance of compliance with the treatment plan.  Return in about 4 months (around 02/26/2025).   Keyion Knack, MD     [1]  Current Outpatient Medications:    acetaminophen  (TYLENOL ) 325 MG tablet, Take 2 tablets (650 mg total) by mouth every 6 (six) hours as needed for mild pain (pain score 1-3) or fever., Disp: , Rfl:    albuterol  (PROVENTIL ) (2.5 MG/3ML) 0.083% nebulizer solution, Take 3 mLs (2.5 mg total) by nebulization every 6 (six) hours as needed for wheezing or shortness of breath., Disp: 75 mL, Rfl: 5   allopurinol  (ZYLOPRIM ) 300 MG tablet, Take 1 tablet (300 mg total) by mouth daily. Take 1  daily, Disp: 30 tablet, Rfl: 0   Ferrous Sulfate  (IRON ) 325 (65 Fe) MG TABS, Take 1 tablet by mouth daily., Disp: , Rfl:    furosemide  (LASIX ) 20 MG tablet, Take 1 tablet (20 mg total) by mouth every other day. (Patient taking differently: Take 20 mg by mouth daily.), Disp: 15 tablet, Rfl: 0   levofloxacin  (LEVAQUIN ) 750 MG tablet, Take 1 tablet (750 mg total) by mouth daily., Disp: 5 tablet, Rfl: 0   levothyroxine (SYNTHROID) 50 MCG tablet, Take 50 mcg by mouth every morning., Disp: , Rfl:    LINZESS  145 MCG CAPS capsule, Take 145 mcg by mouth daily., Disp: , Rfl:    metoprolol  succinate (TOPROL -XL) 25 MG 24 hr tablet, Take 1 tablet (25 mg total) by mouth daily., Disp: 90 tablet, Rfl: 3   pantoprazole  (PROTONIX ) 40 MG tablet, Take 1 tablet (40 mg total) by mouth daily., Disp: 30 tablet, Rfl: 0   pravastatin  (PRAVACHOL ) 20 MG tablet, Take 1 tablet (20 mg total) by mouth every evening., Disp: 30 tablet, Rfl: 0   sucralfate  (CARAFATE ) 1 GM/10ML suspension, Take 10 mLs (1 g total) by mouth 4 (four) times  daily., Disp: 420 mL, Rfl: 1   warfarin (COUMADIN ) 5 MG tablet, Take 1 tablet daily or as directed by anticoagulation clinic, Disp: 100 tablet, Rfl: 1  "

## 2024-10-29 NOTE — Telephone Encounter (Signed)
 Pt came to front desk saying he had labs done after Christmas and hasn't heard about his results. There's a MyChart message regarding his results, but I doubt he has read it. He asked for someone to call him and if he doesn't answer to leave him a message. [phone]

## 2024-10-29 NOTE — Assessment & Plan Note (Signed)
 Community-acquired pneumonia, right middle lobe HRCT shows a small area of consolidation in the right middle lobe along with productive cough of brownish/greenish sputum suggests walking pneumonia. Will treat with levofloxacin  for 5 days and repeat chest imaging in two to three months to confirm resolution. - Prescribed levofloxacin  for 5 days. - Will repeat CT scan in a few months to ensure resolution of pneumonia.

## 2024-10-30 ENCOUNTER — Telehealth: Payer: Self-pay

## 2024-10-30 NOTE — Telephone Encounter (Signed)
 Patient has been scheduled for televisit med rec and consent done     Patient Consent for Virtual Visit         Douglas [name] has provided verbal consent on 10/30/2024 for a virtual visit (video or telephone).   CONSENT FOR VIRTUAL VISIT FOR:  Douglas [name]  By participating in this virtual visit I agree to the following:  I hereby voluntarily request, consent and authorize [facility] and its employed or contracted physicians, physician assistants, nurse practitioners or other licensed health care professionals (the Practitioner), to provide me with telemedicine health care services (the Services) as deemed necessary by the treating Practitioner. I acknowledge and consent to receive the Services by the Practitioner via telemedicine. I understand that the telemedicine visit will involve communicating with the Practitioner through live audiovisual communication technology and the disclosure of certain medical information by electronic transmission. I acknowledge that I have been given the opportunity to request an in-person assessment or other available alternative prior to the telemedicine visit and am voluntarily participating in the telemedicine visit.  I understand that I have the right to withhold or withdraw my consent to the use of telemedicine in the course of my care at any time, without affecting my right to future care or treatment, and that the Practitioner or I may terminate the telemedicine visit at any time. I understand that I have the right to inspect all information obtained and/or recorded in the course of the telemedicine visit and may receive copies of available information for a reasonable fee.  I understand that some of the potential risks of receiving the Services via telemedicine include:  Delay or interruption in medical evaluation due to technological equipment failure or disruption; Information transmitted may not be sufficient (e.g. poor resolution of images)  to allow for appropriate medical decision making by the Practitioner; and/or  In rare instances, security protocols could fail, causing a breach of personal health information.  Furthermore, I acknowledge that it is my responsibility to provide information about my medical history, conditions and care that is complete and accurate to the best of my ability. I acknowledge that Practitioner's advice, recommendations, and/or decision may be based on factors not within their control, such as incomplete or inaccurate data provided by me or distortions of diagnostic images or specimens that may result from electronic transmissions. I understand that the practice of medicine is not an exact science and that Practitioner makes no warranties or guarantees regarding treatment outcomes. I acknowledge that a copy of this consent can be made available to me via my patient portal [facility] MyChart), or I can request a printed copy by calling the office of [facility].    I understand that my insurance will be billed for this visit.   I have read or had this consent read to me. I understand the contents of this consent, which adequately explains the benefits and risks of the Services being provided via telemedicine.  I have been provided ample opportunity to ask questions regarding this consent and the Services and have had my questions answered to my satisfaction. I give my informed consent for the services to be provided through the use of telemedicine in my medical care

## 2024-10-30 NOTE — Telephone Encounter (Signed)
 Called pt and made aware I can't say when cardiology will send us  the clearance back but I have resent it again back to them to try to get finalized and would call as soon as we get it. He voiced understanding.

## 2024-10-30 NOTE — Telephone Encounter (Signed)
 Patient has been scheduled for TELEVISIT

## 2024-10-30 NOTE — Telephone Encounter (Signed)
 I spoke with the patient and made him aware we had sent him a message through My Chart and I had tried calling him yesterday and had left him a message to call the office back. I did let him know per Chelsea's message, Per Chelsea's message via My Chart to the patient, Douglas [name], Hemoglobin is slightly better. Please continue taking your iron  pill twice daily. He asked about his up coming procedure, and when this would be scheduled. I let him know we where waiting on a clearance from Cardiology. He wanted to know how long that usually takes.

## 2024-10-30 NOTE — Telephone Encounter (Signed)
" ° °  Name: Douglas [name]  DOB: [birth year]  MRN: [id number]  Primary Cardiologist: Jayson Sierras, MD   Preoperative team, please contact this patient and set up a phone call appointment for further preoperative risk assessment. Please obtain consent and complete medication review. Thank you for your help.  I confirm that guidance regarding antiplatelet and oral anticoagulation therapy has been completed and, if necessary, noted below.     Per office protocol, patient can hold warfarin for 5 days prior to procedure.     Patient will not need bridging with Lovenox (enoxaparin) around procedure. Please resume warfarin as soon as possible postprocedure, at the discretion of the surgeon.    I also confirmed the patient resides in the state of [place] . As per [facility] Medical Board telemedicine laws, the patient must reside in the state in which the provider is licensed.   [name], NP 10/30/2024, 10:40 AM [facility]    "

## 2024-11-02 NOTE — Telephone Encounter (Signed)
 He is scheduled with cardiology for pre-op telephone visit 11/05/24. FYI

## 2024-11-03 ENCOUNTER — Telehealth: Payer: Self-pay

## 2024-11-03 NOTE — Telephone Encounter (Signed)
 Copied from CRM #[id number]. Topic: Clinical - Prescription Issue >> Oct 30, 2024  3:49 PM Benton O wrote: Reason for CRM: patient is calling because he was seen patient says he received the nebulizer solution but not the machine patient is needing the nebulizer machine sent to . Patient says he wants to pick it up there   [facility] - [place], [place] - [address] [[id number]]  Sending to pcc

## 2024-11-03 NOTE — Telephone Encounter (Signed)
 Spoke with patient and he states he received the parts for the nebulizer and the other supplies---I informed patient I had spoke with [employer] and they are checking into this and I will let him know what I find out---he voiced his understanding

## 2024-11-05 ENCOUNTER — Ambulatory Visit: Attending: Cardiology | Admitting: Emergency Medicine

## 2024-11-05 DIAGNOSIS — Z01810 Encounter for preprocedural cardiovascular examination: Secondary | ICD-10-CM | POA: Diagnosis not present

## 2024-11-05 NOTE — Progress Notes (Signed)
 "   Virtual Visit via Telephone Note   Because of Douglas [name] co-morbid illnesses, he is at least at moderate risk for complications without adequate follow up.  This format is felt to be most appropriate for this patient at this time.  Due to technical limitations with video connection web designer), today's appointment will be conducted as an audio only telehealth visit, and Douglas [name] verbally agreed to proceed in this manner.   All issues noted in this document were discussed and addressed.  No physical exam could be performed with this format.  Evaluation Performed:  Preoperative cardiovascular risk assessment _____________   Date:  11/05/2024   Patient ID:  Douglas [name], DOB November 28, 1927, MRN [id number] Patient Location:  Home Provider location:   Office  Primary Care Provider:  Sheryle Carwin, MD Primary Cardiologist:  Jayson Sierras, MD  Chief Complaint / Patient Profile   89 y.o. y/o male with a h/o paroxysmal atrial fibrillation, coronary artery disease s/p CABG in 2013 and stress test in 2019 showing mild to moderate ischemia, complete heart block s/p PPM, thoracic aortic aneurysm s/p Bentall procedure in 2013 with AVR, hypertension, hyperlipidemia, HFimpEF who is pending EGD on date TBD with [facility] gastroenterology at [facility] and presents today for telephonic preoperative cardiovascular risk assessment.  History of Present Illness    Douglas [name] is a 89 y.o. male who presents via audio/video conferencing for a telehealth visit today.  Pt was last seen in cardiology clinic on 07/23/2024 by Dr. Waddell.  At that time Douglas [name] was doing well.  The patient is now pending procedure as outlined above. Since his last visit, he is doing well without acute cardiovascular concerns or complaints.  Tells me he was getting over pneumonia several weeks ago.  He is without chest pains, dyspnea, or exertional symptoms.  Tells me he stays fairly active for 89 year old man.  He does  clean the house, can walk up stairs with railing, do dishes and wash close without exertional symptoms.  He denies symptoms concerning for recurrent atrial fibrillation.  He denies any orthopnea, PND, LEE.  Overall he is able to complete greater than 4 METS and remains asymptomatic.  Past Medical History    Past Medical History:  Diagnosis Date   Acute upper GI bleed 05/18/2016   Aneurysm of thoracic aorta    Ascending. 5.7 x 5.5cm - Bentall procedure   Aortic stenosis    Bioprosthetic AVR   Arthritis    Atrial fibrillation (HCC)    Documented on pacer interrogation 6/13   Complete heart block (HCC)    Status post pacemaker placement   Coronary atherosclerosis of native coronary artery    Multivessel - LIMA to LAD, SVG to OM, SVG to PDA   DJD (degenerative joint disease)    Esophageal dysmotility    Essential hypertension    Fibromyalgia    Gout    H/O arteriovenous malformation (AVM)    H/O hiatal hernia    Hemorrhoid    History of esophageal ulcer    History of skin cancer    Hx of adenomatous colonic polyps    Iron  deficiency anemia due to chronic blood loss    Melena    Pacemaker-Medtronic    PMR (polymyalgia rheumatica)    SIRS (systemic inflammatory response syndrome) (HCC) 01/16/2015   Past Surgical History:  Procedure Laterality Date   AORTIC VALVE REPLACEMENT  01/24/12   APPENDECTOMY     BACK SURGERY  1965 or 1966 lumbar back surgery   BENTALL PROCEDURE  01/24/2012   Procedure: BENTALL PROCEDURE;  Surgeon: Maude Fleeta Ochoa, MD;  Location: [facility] OR;  Service: Open Heart Surgery;  Laterality: N/A;   BILATERAL KNEE ARTHROSCOPY     CATARACT EXTRACTION W/PHACO Left 12/03/2016   Procedure: CATARACT EXTRACTION PHACO AND INTRAOCULAR LENS PLACEMENT LEFT EYE;  Surgeon: Cherene Mania, MD;  Location: [facility] ORS;  Service: Ophthalmology;  Laterality: Left;  CDE:  10.87   CATARACT EXTRACTION W/PHACO Right 12/13/2016   Procedure: CATARACT EXTRACTION PHACO AND INTRAOCULAR LENS PLACEMENT  (IOC);  Surgeon: Cherene Mania, MD;  Location: [facility] ORS;  Service: Ophthalmology;  Laterality: Right;  right cde 11.78   CORONARY ARTERY BYPASS GRAFT  01/24/2012   Procedure: CORONARY ARTERY BYPASS GRAFTING (CABG);  Surgeon: Maude Fleeta Ochoa, MD;  Location: [facility] OR;  Service: Open Heart Surgery;  Laterality: N/A;   ESOPHAGOGASTRODUODENOSCOPY  06/17/2012   Procedure: ESOPHAGOGASTRODUODENOSCOPY (EGD);  Surgeon: Claudis RAYMOND Rivet, MD;  Location: [facility] ENDO SUITE;  Service: Endoscopy;  Laterality: N/A;  ED   ESOPHAGOGASTRODUODENOSCOPY N/A 05/19/2016   Procedure: ESOPHAGOGASTRODUODENOSCOPY (EGD);  Surgeon: Margo LITTIE Haddock, MD;  Location: [facility] ENDO SUITE;  Service: Endoscopy;  Laterality: N/A;   HEMORROIDECTOMY     HERNIA REPAIR     Left inguinal hernia repair     PACEMAKER PLACEMENT     Medtronic 4/13 - Dr. Kelsie   PERMANENT PACEMAKER INSERTION N/A 02/01/2012   Procedure: PERMANENT PACEMAKER INSERTION;  Surgeon: Lynwood Kelsie, MD; Medtronic Adapta L model ADDRL 1 (serial number [id number])     TRANSESOPHAGEAL ECHOCARDIOGRAM (CATH LAB) N/A 12/23/2023   Procedure: TRANSESOPHAGEAL ECHOCARDIOGRAM;  Surgeon: Lonni Slain, MD;  Location: [facility] INVASIVE CV LAB;  Service: Cardiovascular;  Laterality: N/A;    Allergies  Allergies[1]  Home Medications    Prior to Admission medications  Medication Sig Start Date End Date Taking? Authorizing Provider  acetaminophen  (TYLENOL ) 325 MG tablet Take 2 tablets (650 mg total) by mouth every 6 (six) hours as needed for mild pain (pain score 1-3) or fever. 01/16/24   Pearlean Manus, MD  albuterol  (PROVENTIL ) (2.5 MG/3ML) 0.083% nebulizer solution Take 3 mLs (2.5 mg total) by nebulization every 6 (six) hours as needed for wheezing or shortness of breath. 10/29/24 10/29/25  Alghanim, Paula, MD  allopurinol  (ZYLOPRIM ) 300 MG tablet Take 1 tablet (300 mg total) by mouth daily. Take 1  daily 01/27/24   Landy Barnie RAMAN, NP  Ferrous Sulfate  (IRON ) 325 (65 Fe) MG TABS Take 1 tablet by  mouth daily. 09/25/24   [provider]  furosemide  (LASIX ) 20 MG tablet Take 1 tablet (20 mg total) by mouth every other day. Patient taking differently: Take 20 mg by mouth daily. 01/27/24   Landy Barnie RAMAN, NP  levofloxacin  (LEVAQUIN ) 750 MG tablet Take 1 tablet (750 mg total) by mouth daily. 10/29/24   Alghanim, Fahid, MD  levothyroxine (SYNTHROID) 50 MCG tablet Take 50 mcg by mouth every morning. 04/02/24   [provider]  LINZESS  145 MCG CAPS capsule Take 145 mcg by mouth daily. 01/17/24   [provider]  metoprolol  succinate (TOPROL -XL) 25 MG 24 hr tablet Take 1 tablet (25 mg total) by mouth daily. 06/26/24   Debera Jayson MATSU, MD  pantoprazole  (PROTONIX ) 40 MG tablet Take 1 tablet (40 mg total) by mouth daily. 01/27/24   Landy Barnie RAMAN, NP  pravastatin  (PRAVACHOL ) 20 MG tablet Take 1 tablet (20 mg total) by mouth every evening. 01/27/24   Landy,  Barnie RAMAN, NP  sucralfate  (CARAFATE ) 1 GM/10ML suspension Take 10 mLs (1 g total) by mouth 4 (four) times daily. 08/17/24   Mariette Mitzie CROME, NP  warfarin (COUMADIN ) 5 MG tablet Take 1 tablet daily or as directed by anticoagulation clinic 10/13/24   Debera Jayson MATSU, MD    Physical Exam    Vital Signs:  Douglas [name] does not have vital signs available for review today.  Given telephonic nature of communication, physical exam is limited. AAOx3. NAD. Normal affect.  Speech and respirations are unlabored.  Accessory Clinical Findings    None  Assessment & Plan    1.  Preoperative Cardiovascular Risk Assessment: According to the Revised Cardiac Risk Index (RCRI), his Perioperative Risk of Major Cardiac Event is (%): 6.6. His Functional Capacity in METs is: 4.4 according to the Duke Activity Status Index (DASI).  Therefore, based on ACC/AHA guidelines, patient would be at acceptable risk for the planned procedure without further cardiovascular testing. I will route this recommendation to the requesting party via Epic  fax function.  The patient was advised that if he develops new symptoms prior to surgery to contact our office to arrange for a follow-up visit, and he verbalized understanding.  Per office protocol, patient can hold warfarin for 5 days prior to procedure.     Patient will not need bridging with Lovenox (enoxaparin) around procedure.  A copy of this note will be routed to requesting surgeon.  Time:   Today, I have spent 10 minutes with the patient with telehealth technology discussing medical history, symptoms, and management plan.     Lum CROME Louis, NP  11/05/2024, 9:52 AM     [1]  Allergies Allergen Reactions   Imdur  [Isosorbide  Nitrate] Other (See Comments)    Nausea and lightheaded    "

## 2024-11-05 NOTE — Telephone Encounter (Signed)
 Called spoke with pt. He has been scheduled for EGD with Dr. Cinderella, ASA 3 on 11/26/24. Discussed instructions with him, made aware to stop iron  7 days prior and coumadin  5 days prior. Also aware he will be getting a pre-op phone call a few days prior and he will get his arrival time then. He voiced understanding.

## 2024-11-05 NOTE — Telephone Encounter (Signed)
 Pre-op note in epic. Please advise Mitzie thanks!

## 2024-11-10 ENCOUNTER — Ambulatory Visit

## 2024-11-10 ENCOUNTER — Telehealth: Payer: Self-pay | Admitting: Cardiology

## 2024-11-10 NOTE — Telephone Encounter (Signed)
 Pt states someone called to r/s his coumadin  appt for today. He has questions because he is suppose to come off medication for a procedure. Please advise

## 2024-11-10 NOTE — Telephone Encounter (Signed)
 Spoke with patient and discussed pre op instructions and scheduled recheck INR for 2/10

## 2024-11-17 ENCOUNTER — Ambulatory Visit: Payer: Medicare Other

## 2024-11-19 ENCOUNTER — Encounter (HOSPITAL_COMMUNITY): Payer: Self-pay

## 2024-11-20 ENCOUNTER — Encounter (HOSPITAL_COMMUNITY): Payer: Self-pay

## 2024-11-20 ENCOUNTER — Other Ambulatory Visit: Payer: Self-pay

## 2024-11-23 ENCOUNTER — Encounter (HOSPITAL_COMMUNITY)
Admission: RE | Admit: 2024-11-23 | Discharge: 2024-11-23 | Disposition: A | Discharge status: Home / Self Care | Source: Ambulatory Visit | Attending: Gastroenterology | Admitting: Gastroenterology

## 2024-11-24 ENCOUNTER — Telehealth (INDEPENDENT_AMBULATORY_CARE_PROVIDER_SITE_OTHER): Payer: Self-pay

## 2024-11-24 NOTE — Telephone Encounter (Signed)
 Patient left vm wanting to confirm the date of his procedure. Returned patients call and got no answer. LVM stating the date of his procedure and my call back number.

## 2024-11-25 ENCOUNTER — Ambulatory Visit

## 2024-11-26 ENCOUNTER — Telehealth (INDEPENDENT_AMBULATORY_CARE_PROVIDER_SITE_OTHER): Payer: Self-pay | Admitting: *Deleted

## 2024-11-26 ENCOUNTER — Encounter (HOSPITAL_COMMUNITY): Admission: RE | Disposition: A | Payer: Self-pay | Source: Home / Self Care | Attending: Gastroenterology

## 2024-11-26 ENCOUNTER — Encounter (HOSPITAL_COMMUNITY): Payer: Self-pay | Admitting: Gastroenterology

## 2024-11-26 ENCOUNTER — Ambulatory Visit (HOSPITAL_COMMUNITY): Admitting: Certified Registered"

## 2024-11-26 ENCOUNTER — Ambulatory Visit (HOSPITAL_COMMUNITY)
Admission: RE | Admit: 2024-11-26 | Discharge: 2024-11-26 | Disposition: A | Source: Home / Self Care | Attending: Gastroenterology | Admitting: Gastroenterology

## 2024-11-26 ENCOUNTER — Ambulatory Visit

## 2024-11-26 DIAGNOSIS — K2970 Gastritis, unspecified, without bleeding: Secondary | ICD-10-CM

## 2024-11-26 DIAGNOSIS — K31819 Angiodysplasia of stomach and duodenum without bleeding: Secondary | ICD-10-CM

## 2024-11-26 DIAGNOSIS — D649 Anemia, unspecified: Secondary | ICD-10-CM

## 2024-11-26 MED ORDER — LACTATED RINGERS IV SOLN: INTRAVENOUS | Status: DC

## 2024-11-26 MED ORDER — PROPOFOL 10 MG/ML IV BOLUS
INTRAVENOUS | Status: DC | PRN
  Administered 2024-11-26: 50 mg via INTRAVENOUS
  Administered 2024-11-26: 85 ug/kg/min via INTRAVENOUS

## 2024-11-26 MED ORDER — PANTOPRAZOLE SODIUM 40 MG PO TBEC: 40.0000 mg | DELAYED_RELEASE_TABLET | Freq: Two times a day (BID) | ORAL | 1 refills | Status: AC

## 2024-11-26 MED ORDER — LIDOCAINE 2% (20 MG/ML) 5 ML SYRINGE
INTRAMUSCULAR | Status: DC | PRN
  Administered 2024-11-26: 60 mg via INTRAVENOUS

## 2024-11-26 MED ORDER — PHENYLEPHRINE 80 MCG/ML (10ML) SYRINGE FOR IV PUSH (FOR BLOOD PRESSURE SUPPORT)
PREFILLED_SYRINGE | INTRAVENOUS | Status: DC | PRN
  Administered 2024-11-26: 160 ug via INTRAVENOUS
  Administered 2024-11-26 (×2): 240 ug via INTRAVENOUS
  Administered 2024-11-26: 160 ug via INTRAVENOUS
  Administered 2024-11-26 (×2): 80 ug via INTRAVENOUS
  Administered 2024-11-26 (×2): 160 ug via INTRAVENOUS
  Administered 2024-11-26: 80 ug via INTRAVENOUS

## 2024-11-26 NOTE — Discharge Instructions (Addendum)
" °  Discharge instructions Please read the instructions outlined below and refer to this sheet in the next few weeks. These discharge instructions provide you with general information on caring for yourself after you leave the hospital. Your doctor may also give you specific instructions. While your treatment has been planned according to the most current medical practices available, unavoidable complications occasionally occur. If you have any problems or questions after discharge, please call your doctor. ACTIVITY You may resume your regular activity but move at a slower pace for the next 24 hours.  Take frequent rest periods for the next 24 hours.  Walking will help expel (get rid of) the air and reduce the bloated feeling in your abdomen.  No driving for 24 hours (because of the anesthesia (medicine) used during the test).  You may shower.  Do not sign any important legal documents or operate any machinery for 24 hours (because of the anesthesia used during the test).  NUTRITION Drink plenty of fluids.  You may resume your normal diet.  Begin with a light meal and progress to your normal diet.  Avoid alcoholic beverages for 24 hours or as instructed by your caregiver.  MEDICATIONS You may resume your normal medications unless your caregiver tells you otherwise.  WHAT YOU CAN EXPECT TODAY You may experience abdominal discomfort such as a feeling of fullness or gas pains.  FOLLOW-UP Your doctor will discuss the results of your test with you.  SEEK IMMEDIATE MEDICAL ATTENTION IF ANY OF THE FOLLOWING OCCUR: Excessive nausea (feeling sick to your stomach) and/or vomiting.  Severe abdominal pain and distention (swelling).  Trouble swallowing.  Temperature over 101 F (37.8 C).  Rectal bleeding or vomiting of blood.    Labs tomorrow Hematology for iron  infusion    I hope you have a great rest of your week!   Hanah Moultry Faizan Valary Manahan , M.D.. Gastroenterology and Hepatology [facility]  [facility]    "

## 2024-11-26 NOTE — H&P (Signed)
 " Primary Care Physician:  Sheryle Carwin, MD Primary Gastroenterologist:  Dr. Cinderella  Pre-Procedure History & Physical: HPI: Douglas [name] is a 89 y.o. male with a history of aortic stenosis and thoracic aortic aneurysm s/p Bental surgery (aortic valve replacement), CAD s/p CABG, fibromyalgia, HTN, gout, PMR, Enterobacter bacteremia, constipation, and hemorrhoids presenting today for EGD for evaluation of Iron  deficiency anemia, stools concerning for melena in setting of anticoagulation use    EGD 2017: - Overt bleeding noted in the stomach for large gastric AVM, epinephrine  used for hemostasis along with APC therapy.  To prevent further bleeding he had 3 hemostatic clips placed.   Since March 2025 he has had a steady downtrend of hemoglobin from the 10-11 range down to 7.7 on 12/4.   Per review of [facility] database  hemoglobin on 12/8 was 8 hemoglobin of 7.6 on 12/17.   B12 in November was 624 folate 11.5 Iron  17, TIBC 419 sat 4 ferritin 12  Past Medical History:  Diagnosis Date   Acute upper GI bleed 05/18/2016   Aneurysm of thoracic aorta    Ascending. 5.7 x 5.5cm - Bentall procedure   Aortic stenosis    Bioprosthetic AVR   Arthritis    Atrial fibrillation (HCC)    Documented on pacer interrogation 6/13   Complete heart block (HCC)    Status post pacemaker placement   Coronary atherosclerosis of native coronary artery    Multivessel - LIMA to LAD, SVG to OM, SVG to PDA   DJD (degenerative joint disease)    Esophageal dysmotility    Essential hypertension    Fibromyalgia    Gout    H/O arteriovenous malformation (AVM)    H/O hiatal hernia    Hemorrhoid    History of esophageal ulcer    History of skin cancer    Hx of adenomatous colonic polyps    Iron  deficiency anemia due to chronic blood loss    Melena    Pacemaker-Medtronic    PMR (polymyalgia rheumatica)    SIRS (systemic inflammatory response syndrome) (HCC) 01/16/2015    Past Surgical History:  Procedure Laterality  Date   AORTIC VALVE REPLACEMENT  01/24/2012   APPENDECTOMY     BACK SURGERY     1965 or 1966 lumbar back surgery   BENTALL PROCEDURE  01/24/2012   Procedure: BENTALL PROCEDURE;  Surgeon: Maude Fleeta Ochoa, MD;  Location: [facility] OR;  Service: Open Heart Surgery;  Laterality: N/A;   BILATERAL KNEE ARTHROSCOPY     CATARACT EXTRACTION W/PHACO Left 12/03/2016   Procedure: CATARACT EXTRACTION PHACO AND INTRAOCULAR LENS PLACEMENT LEFT EYE;  Surgeon: Cherene Mania, MD;  Location: [facility] ORS;  Service: Ophthalmology;  Laterality: Left;  CDE:  10.87   CATARACT EXTRACTION W/PHACO Right 12/13/2016   Procedure: CATARACT EXTRACTION PHACO AND INTRAOCULAR LENS PLACEMENT (IOC);  Surgeon: Cherene Mania, MD;  Location: [facility] ORS;  Service: Ophthalmology;  Laterality: Right;  right cde 11.78   CORONARY ARTERY BYPASS GRAFT  01/24/2012   Procedure: CORONARY ARTERY BYPASS GRAFTING (CABG);  Surgeon: Maude Fleeta Ochoa, MD;  Location: [facility] OR;  Service: Open Heart Surgery;  Laterality: N/A;   ESOPHAGOGASTRODUODENOSCOPY  06/17/2012   Procedure: ESOPHAGOGASTRODUODENOSCOPY (EGD);  Surgeon: Claudis RAYMOND Rivet, MD;  [facility];  Service: Endoscopy;  Laterality: N/A;  ED   ESOPHAGOGASTRODUODENOSCOPY N/A 05/19/2016   Procedure: ESOPHAGOGASTRODUODENOSCOPY (EGD);  Surgeon: Margo LITTIE Haddock, MD;  Location: [facility];  Service: Endoscopy;  Laterality: N/A;   HEMORROIDECTOMY  HERNIA REPAIR     Left inguinal hernia repair     PACEMAKER PLACEMENT     Medtronic 4/13 - Dr. Kelsie   PERMANENT PACEMAKER INSERTION N/A 02/01/2012   Procedure: PERMANENT PACEMAKER INSERTION;  Surgeon: Lynwood Kelsie, MD; Medtronic Adapta L model ADDRL 1 (serial number [id number])     TRANSESOPHAGEAL ECHOCARDIOGRAM (CATH LAB) N/A 12/23/2023   Procedure: TRANSESOPHAGEAL ECHOCARDIOGRAM;  Surgeon: Lonni Slain, MD;  Location: [facility] INVASIVE CV LAB;  Service: Cardiovascular;  Laterality: N/A;    Prior to Admission medications  Medication Sig Start Date End  Date Taking? Authorizing Provider  allopurinol  (ZYLOPRIM ) 300 MG tablet Take 1 tablet (300 mg total) by mouth daily. Take 1  daily 01/27/24  Yes Landy Barnie RAMAN, NP  furosemide  (LASIX ) 20 MG tablet Take 1 tablet (20 mg total) by mouth every other day. Patient taking differently: Take 20 mg by mouth daily. 01/27/24  Yes Landy Barnie RAMAN, NP  levothyroxine (SYNTHROID) 50 MCG tablet Take 50 mcg by mouth every morning. 04/02/24  Yes [provider]  LINZESS  145 MCG CAPS capsule Take 145 mcg by mouth daily. 01/17/24  Yes [provider]  metoprolol  succinate (TOPROL -XL) 25 MG 24 hr tablet Take 1 tablet (25 mg total) by mouth daily. 06/26/24  Yes Debera Jayson MATSU, MD  pantoprazole  (PROTONIX ) 40 MG tablet Take 1 tablet (40 mg total) by mouth daily. 01/27/24  Yes Landy Barnie RAMAN, NP  pravastatin  (PRAVACHOL ) 20 MG tablet Take 1 tablet (20 mg total) by mouth every evening. 01/27/24  Yes Landy Barnie RAMAN, NP  acetaminophen  (TYLENOL ) 325 MG tablet Take 2 tablets (650 mg total) by mouth every 6 (six) hours as needed for mild pain (pain score 1-3) or fever. 01/16/24   Pearlean Manus, MD  albuterol  (PROVENTIL ) (2.5 MG/3ML) 0.083% nebulizer solution Take 3 mLs (2.5 mg total) by nebulization every 6 (six) hours as needed for wheezing or shortness of breath. 10/29/24 10/29/25  Alghanim, Paula, MD  Ferrous Sulfate  (IRON ) 325 (65 Fe) MG TABS Take 1 tablet by mouth daily. 09/25/24   [provider]  levofloxacin  (LEVAQUIN ) 750 MG tablet Take 1 tablet (750 mg total) by mouth daily. Patient not taking: Reported on 11/20/2024 10/29/24   Catherine Paula, MD  sucralfate  (CARAFATE ) 1 GM/10ML suspension Take 10 mLs (1 g total) by mouth 4 (four) times daily. Patient not taking: Reported on 11/20/2024 08/17/24   Mariette Mitzie CROME, NP  warfarin (COUMADIN ) 5 MG tablet Take 1 tablet daily or as directed by anticoagulation clinic 10/13/24   Debera Jayson MATSU, MD    Allergies as of 11/05/2024 - Review Complete  11/05/2024  Allergen Reaction Noted   Imdur  [isosorbide  nitrate] Other (See Comments) 12/28/2015    Family History  Problem Relation Age of Onset   Kidney disease Mother    Heart disease Father    Anesthesia problems Neg Hx    Hypotension Neg Hx    Malignant hyperthermia Neg Hx    Pseudochol deficiency Neg Hx     Social History   Socioeconomic History   Marital status: Married    Spouse name: Vernell   Number of children: 4   Years of education: Not on file   Highest education level: Not on file  Occupational History   Occupation: Retired    Comment: Engineer, maintenance  Tobacco Use   Smoking status: Never   Smokeless tobacco: Never  Vaping Use   Vaping status: Never Used  Substance and Sexual Activity   Alcohol use:  No    Alcohol/week: 0.0 standard drinks of alcohol   Drug use: No   Sexual activity: Never    Birth control/protection: None  Other Topics Concern   Not on file  Social History Narrative   Lives with wife, has been married since 1954.   Social Drivers of Health   Tobacco Use: Low Risk (11/26/2024)   Patient History    Smoking Tobacco Use: Never    Smokeless Tobacco Use: Never    Passive Exposure: Not on file  Financial Resource Strain: Low Risk (02/06/2024)   Overall Financial Resource Strain (CARDIA)    Difficulty of Paying Living Expenses: Not hard at all  Food Insecurity: No Food Insecurity (03/26/2024)   Hunger Vital Sign    Worried About Running Out of Food in the Last Year: Never true    Ran Out of Food in the Last Year: Never true  Transportation Needs: No Transportation Needs (02/06/2024)   PRAPARE - Administrator, Civil Service (Medical): No    Lack of Transportation (Non-Medical): No  Physical Activity: Inactive (02/06/2024)   Exercise Vital Sign    Days of Exercise per Week: 0 days    Minutes of Exercise per Session: 10 min  Stress: No Stress Concern Present (02/06/2024)   [employer] of Occupational Health - Occupational  Stress Questionnaire    Feeling of Stress : Not at all  Social Connections: Moderately Integrated (02/06/2024)   Social Connection and Isolation Panel    Frequency of Communication with Friends and Family: Twice a week    Frequency of Social Gatherings with Friends and Family: Twice a week    Attends Religious Services: 1 to 4 times per year    Active Member of [employer] or Organizations: No    Attends Banker Meetings: Never    Marital Status: Married  Recent Concern: Social Connections - Moderately Isolated (01/10/2024)   Social Connection and Isolation Panel    Frequency of Communication with Friends and Family: Never    Frequency of Social Gatherings with Friends and Family: Once a week    Attends Religious Services: 1 to 4 times per year    Active Member of [employer] or Organizations: No    Attends Banker Meetings: Never    Marital Status: Married  Catering Manager Violence: Not At Risk (02/06/2024)   Humiliation, Afraid, Rape, and Kick questionnaire    Fear of Current or Ex-Partner: No    Emotionally Abused: No    Physically Abused: No    Sexually Abused: No  Depression (PHQ2-9): Low Risk (05/28/2024)   Depression (PHQ2-9)    PHQ-2 Score: 0  Alcohol Screen: Not on file  Housing: Low Risk (02/06/2024)   Housing Stability Vital Sign    Unable to Pay for Housing in the Last Year: No    Number of Times Moved in the Last Year: 0    Homeless in the Last Year: No  Utilities: Not At Risk (02/06/2024)   AHC Utilities    Threatened with loss of utilities: No  Health Literacy: Adequate Health Literacy (02/06/2024)   B1300 Health Literacy    Frequency of need for help with medical instructions: Never    Review of Systems: See HPI, otherwise negative ROS  Physical Exam: Vital signs in last 24 hours:     General:   Alert,  Well-developed, well-nourished, pleasant and cooperative in NAD Head:  Normocephalic and atraumatic. Eyes:  Sclera clear, no icterus.    Conjunctiva  pink. Ears:  Normal auditory acuity. Nose:  No deformity, discharge,  or lesions. Msk:  Symmetrical without gross deformities. Normal posture. Extremities:  Without clubbing or edema. Neurologic:  Alert and  oriented x4;  grossly normal neurologically. Skin:  Intact without significant lesions or rashes. Psych:  Alert and cooperative. Normal mood and affect.  Impression/Plan: Douglas [name] is a 89 y.o. male with a history of aortic stenosis and thoracic aortic aneurysm s/p Bental surgery (aortic valve replacement), CAD s/p CABG, fibromyalgia, HTN, gout, PMR, Enterobacter bacteremia, constipation, and hemorrhoids presenting today for EGD for evaluation of Iron  deficiency anemia, stools concerning for melena in setting of anticoagulation use   Proceed with EGD   The risks of the procedure including infection, bleed, or perforation as well as benefits, limitations, alternatives and imponderables have been reviewed with the patient. Questions have been answered. All parties agreeable.  "

## 2024-11-26 NOTE — Telephone Encounter (Signed)
 Referral sent, they will contact patient with apt

## 2024-11-26 NOTE — Anesthesia Postprocedure Evaluation (Signed)
"   Anesthesia Post Note  Patient: Douglas [name]  Procedure(s) Performed: EGD (ESOPHAGOGASTRODUODENOSCOPY) EGD, WITH ARGON PLASMA COAGULATION EGD, WITH POLYPECTOMY CONTROL OF HEMORRHAGE, GI TRACT, ENDOSCOPIC, BY CLIPPING OR OVERSEWING  Patient location during evaluation: Phase II Anesthesia Type: MAC Level of consciousness: awake Pain management: pain level controlled Vital Signs Assessment: post-procedure vital signs reviewed and stable Respiratory status: spontaneous breathing and respiratory function stable Cardiovascular status: blood pressure returned to baseline and stable Postop Assessment: no headache and no apparent nausea or vomiting Anesthetic complications: no Comments: Late entry   No notable events documented.   Last Vitals:  Vitals:   11/26/24 0922 11/26/24 0927  BP: (!) 107/45 126/63  Pulse: 70   Resp: 15   Temp: (!) 36.4 C   SpO2: 100% 100%    Last Pain:  Vitals:   11/26/24 0927  TempSrc:   PainSc: 0-No pain                 Yvonna PARAS Laraina Sulton      "

## 2024-11-26 NOTE — Op Note (Addendum)
 [facility] Patient Name: Douglas [name] Procedure Date: 11/26/2024 8:35 AM MRN: [id number] Date of Birth: 07/31/1928 Attending MD: Deatrice Dine , MD, [id number] CSN: [id number] Age: 89 Admit Type: Outpatient Procedure:                Upper GI endoscopy Indications:              Unexplained iron  deficiency anemia, Melena Providers:                Deatrice Dine, MD, Jon LABOR. Gerome RN, RN,                            Dorcas Lenis, Technician Referring MD:              Medicines:                Monitored Anesthesia Care Complications:            No immediate complications. Estimated Blood Loss:     Estimated blood loss was minimal. Procedure:                Pre-Anesthesia Assessment:                           - Prior to the procedure, a History and Physical                            was performed, and patient medications and                            allergies were reviewed. The patient's tolerance of                            previous anesthesia was also reviewed. The risks                            and benefits of the procedure and the sedation                            options and risks were discussed with the patient.                            All questions were answered, and informed consent                            was obtained. Prior Anticoagulants: The patient has                            taken Coumadin  (warfarin), last dose was 5 days                            prior to procedure. ASA Grade Assessment: III - A                            patient with severe systemic disease. After  reviewing the risks and benefits, the patient was                            deemed in satisfactory condition to undergo the                            procedure.                           After obtaining informed consent, the endoscope was                            passed under direct vision. Throughout the                            procedure, the  patient's blood pressure, pulse, and                            oxygen saturations were monitored continuously. The                            [id number] ([id number]) Upper was introduced through                            the mouth, and advanced to the second part of                            duodenum. The upper GI endoscopy was accomplished                            without difficulty. The patient tolerated the                            procedure well. Scope In: 8:56:11 AM Scope Out: 9:17:05 AM Total Procedure Duration: 0 hours 20 minutes 54 seconds  Findings:      The examined esophagus was normal.      Multiple 2 to 7 mm sessile polyps with no stigmata of recent bleeding       were found in the entire examined stomach. The polyp was removed with a       cold biopsy forceps. Resection and retrieval were complete.      A single medium angioectasia with bleeding was found on the greater       curvature of the stomach. Coagulation for hemostasis using argon plasma       at 0.3 liters/minute and 20 watts was successful. For hemostasis, two       hemostatic clips were successfully placed (MR conditional). Clip       manufacturer: [employer]. There was no bleeding at the end of the       procedure. (Note post intervention pictures were not captured)      The duodenal bulb and second portion of the duodenum were normal. Impression:               - Normal esophagus.                           -  Multiple gastric polyps. Resected and retrieved.                           - A single bleeding angioectasia in the stomach.                            Treated with argon plasma coagulation (APC). Clips                            (MR conditional) were placed. Clip manufacturer:                            [employer].                           - Normal duodenal bulb and second portion of the                            duodenum. Moderate Sedation:      Per Anesthesia Care Recommendation:            - Patient has a contact number available for                            emergencies. The signs and symptoms of potential                            delayed complications were discussed with the                            patient. Return to normal activities tomorrow.                            Written discharge instructions were provided to the                            patient.                           - Resume previous diet.                           - Continue present medications.                           - Await pathology results.                           - Repeat upper endoscopy PRN.                           -High dose PPI BID                           Check CBC tomorrow                           -Hematology for Iron   infusion Procedure Code(s):        --- Professional ---                           43255, 59, Esophagogastroduodenoscopy, flexible,                            transoral; with control of bleeding, any method                           43239, Esophagogastroduodenoscopy, flexible,                            transoral; with biopsy, single or multiple Diagnosis Code(s):        --- Professional ---                           K31.7, Polyp of stomach and duodenum                           K31.811, Angiodysplasia of stomach and duodenum                            with bleeding                           D50.9, Iron  deficiency anemia, unspecified                           K92.1, Melena (includes Hematochezia) CPT copyright 2022 [employer]. All rights reserved. The codes documented in this report are preliminary and upon coder review may  be revised to meet current compliance requirements. Deatrice Dine, MD Deatrice Dine, MD 11/26/2024 9:24:41 AM This report has been signed electronically. Number of Addenda: 0

## 2024-11-26 NOTE — Anesthesia Preprocedure Evaluation (Signed)
"                                    Anesthesia Evaluation  Patient identified by MRN, date of birth, ID band Patient awake    Reviewed: Allergy & Precautions, H&P , NPO status , Patient's Chart, lab work & pertinent test results, reviewed documented beta blocker date and time   Airway Mallampati: II  TM Distance: >3 FB Neck ROM: full    Dental no notable dental hx.    Pulmonary neg pulmonary ROS, pneumonia   Pulmonary exam normal breath sounds clear to auscultation       Cardiovascular Exercise Tolerance: Good hypertension, + CAD, + Cardiac Stents, +CHF and + DOE  + dysrhythmias + pacemaker  Rhythm:regular Rate:Normal     Neuro/Psych  Neuromuscular disease negative neurological ROS  negative psych ROS   GI/Hepatic negative GI ROS, Neg liver ROS, hiatal hernia,,,  Endo/Other  negative endocrine ROS    Renal/GU Renal diseasenegative Renal ROS  negative genitourinary   Musculoskeletal   Abdominal   Peds  Hematology negative hematology ROS (+) Blood dyscrasia, anemia   Anesthesia Other Findings   Reproductive/Obstetrics negative OB ROS                              Anesthesia Physical Anesthesia Plan  ASA: 3  Anesthesia Plan: MAC   Post-op Pain Management:    Induction:   PONV Risk Score and Plan: Propofol  infusion  Airway Management Planned:   Additional Equipment:   Intra-op Plan:   Post-operative Plan:   Informed Consent: I have reviewed the patients History and Physical, chart, labs and discussed the procedure including the risks, benefits and alternatives for the proposed anesthesia with the patient or authorized representative who has indicated his/her understanding and acceptance.     Dental Advisory Given  Plan Discussed with: CRNA  Anesthesia Plan Comments:         Anesthesia Quick Evaluation  "

## 2024-11-26 NOTE — Transfer of Care (Signed)
 Immediate Anesthesia Transfer of Care Note  Patient: Douglas [name]  Procedure(s) Performed: EGD (ESOPHAGOGASTRODUODENOSCOPY) EGD, WITH ARGON PLASMA COAGULATION EGD, WITH POLYPECTOMY  Patient Location: Short Stay  Anesthesia Type:MAC  Level of Consciousness: drowsy and patient cooperative  Airway & Oxygen Therapy: Patient Spontanous Breathing and Patient connected to face mask oxygen  Post-op Assessment: Report given to RN and Post -op Vital signs reviewed and stable  Post vital signs: Reviewed and stable  Last Vitals:  Vitals Value Taken Time  BP 107/45 11/26/24 09:22  Temp 36.4 C 11/26/24 09:22  Pulse 70 11/26/24 09:22  Resp 15 11/26/24 09:22  SpO2 100 % 11/26/24 09:22    Last Pain:  Vitals:   11/26/24 0922  TempSrc: Axillary  PainSc: Asleep         Complications: No notable events documented.

## 2024-11-26 NOTE — Telephone Encounter (Signed)
-----   Message from Deatrice Dine, MD sent at 11/26/2024  9:28 AM EST ----- Regarding: Reff Hi Alecxander Mainwaring ,  Can you please arrange a referral for this patient to hematology  Diagnosis: Iron  deficiency anemia    Thanks,  Muhammad Faizan Ahmed, MD Gastroenterology and Hepatology [facility]

## 2024-11-26 NOTE — Anesthesia Procedure Notes (Signed)
 Date/Time: 11/26/2024 8:52 AM  Performed by: Para Barrows L, CRNAComments: POM Face Mask.

## 2024-11-27 LAB — CUP PACEART REMOTE DEVICE CHECK
Battery Impedance: 5221 Ohm
Battery Remaining Longevity: 12 mo
Battery Voltage: 2.65 V
Brady Statistic RV Percent Paced: 99 %
Date Time Interrogation Session: 20260205151806
Implantable Lead Connection Status: 753985
Implantable Lead Connection Status: 753985
Implantable Lead Implant Date: 20130412
Implantable Lead Implant Date: 20130412
Implantable Lead Location: 753859
Implantable Lead Location: 753860
Implantable Lead Model: 5076
Implantable Lead Model: 5076
Implantable Pulse Generator Implant Date: 20130412
Lead Channel Impedance Value: 513 Ohm
Lead Channel Impedance Value: 67 Ohm
Lead Channel Pacing Threshold Amplitude: 0.875 V
Lead Channel Pacing Threshold Pulse Width: 0.4 ms
Lead Channel Setting Pacing Amplitude: 2 V
Lead Channel Setting Pacing Pulse Width: 0.4 ms
Lead Channel Setting Sensing Sensitivity: 4 mV
Zone Setting Status: 755011
Zone Setting Status: 755011

## 2024-11-27 LAB — SURGICAL PATHOLOGY

## 2024-12-01 ENCOUNTER — Ambulatory Visit

## 2024-12-23 ENCOUNTER — Ambulatory Visit

## 2024-12-27 ENCOUNTER — Ambulatory Visit

## 2025-01-25 ENCOUNTER — Ambulatory Visit

## 2025-01-27 ENCOUNTER — Ambulatory Visit

## 2025-02-16 ENCOUNTER — Ambulatory Visit: Payer: Medicare Other

## 2025-02-24 ENCOUNTER — Ambulatory Visit

## 2025-02-27 ENCOUNTER — Ambulatory Visit

## 2025-03-22 ENCOUNTER — Ambulatory Visit: Admitting: Pulmonary Disease

## 2025-03-24 ENCOUNTER — Ambulatory Visit

## 2025-05-18 ENCOUNTER — Ambulatory Visit: Payer: Medicare Other

## 2025-05-26 ENCOUNTER — Ambulatory Visit

## 2025-06-29 ENCOUNTER — Ambulatory Visit

## 2025-07-30 ENCOUNTER — Ambulatory Visit

## 2025-08-17 ENCOUNTER — Ambulatory Visit: Payer: Medicare Other
